# Patient Record
Sex: Female | Born: 1935 | Race: White | Hispanic: No | Smoking: Never smoker
Health system: Southern US, Community
[De-identification: ages and names within clinical notes are randomized; demographics above are authoritative.]

## PROBLEM LIST (undated history)

## (undated) DIAGNOSIS — R251 Tremor, unspecified: Secondary | ICD-10-CM

## (undated) DIAGNOSIS — K219 Gastro-esophageal reflux disease without esophagitis: Secondary | ICD-10-CM

## (undated) DIAGNOSIS — I509 Heart failure, unspecified: Secondary | ICD-10-CM

## (undated) DIAGNOSIS — Z9889 Other specified postprocedural states: Secondary | ICD-10-CM

## (undated) DIAGNOSIS — R0600 Dyspnea, unspecified: Secondary | ICD-10-CM

## (undated) DIAGNOSIS — I499 Cardiac arrhythmia, unspecified: Secondary | ICD-10-CM

## (undated) DIAGNOSIS — IMO0002 Reserved for concepts with insufficient information to code with codable children: Secondary | ICD-10-CM

## (undated) DIAGNOSIS — R112 Nausea with vomiting, unspecified: Secondary | ICD-10-CM

## (undated) DIAGNOSIS — Z923 Personal history of irradiation: Secondary | ICD-10-CM

## (undated) DIAGNOSIS — I2510 Atherosclerotic heart disease of native coronary artery without angina pectoris: Secondary | ICD-10-CM

## (undated) DIAGNOSIS — I495 Sick sinus syndrome: Secondary | ICD-10-CM

## (undated) DIAGNOSIS — J3089 Other allergic rhinitis: Secondary | ICD-10-CM

## (undated) DIAGNOSIS — Z950 Presence of cardiac pacemaker: Secondary | ICD-10-CM

## (undated) DIAGNOSIS — E785 Hyperlipidemia, unspecified: Secondary | ICD-10-CM

## (undated) DIAGNOSIS — K589 Irritable bowel syndrome without diarrhea: Secondary | ICD-10-CM

## (undated) DIAGNOSIS — K635 Polyp of colon: Secondary | ICD-10-CM

## (undated) DIAGNOSIS — S32020A Wedge compression fracture of second lumbar vertebra, initial encounter for closed fracture: Secondary | ICD-10-CM

## (undated) DIAGNOSIS — I4891 Unspecified atrial fibrillation: Secondary | ICD-10-CM

## (undated) DIAGNOSIS — I10 Essential (primary) hypertension: Secondary | ICD-10-CM

## (undated) DIAGNOSIS — E119 Type 2 diabetes mellitus without complications: Secondary | ICD-10-CM

## (undated) HISTORY — DX: Gastro-esophageal reflux disease without esophagitis: K21.9

## (undated) HISTORY — PX: BREAST BIOPSY: SHX20

## (undated) HISTORY — DX: Reserved for concepts with insufficient information to code with codable children: IMO0002

## (undated) HISTORY — DX: Wedge compression fracture of second lumbar vertebra, initial encounter for closed fracture: S32.020A

## (undated) HISTORY — DX: Polyp of colon: K63.5

## (undated) HISTORY — PX: ABDOMINAL ADHESION SURGERY: SHX90

## (undated) HISTORY — DX: Hyperlipidemia, unspecified: E78.5

## (undated) HISTORY — DX: Irritable bowel syndrome, unspecified: K58.9

## (undated) HISTORY — DX: Essential (primary) hypertension: I10

## (undated) HISTORY — PX: ABDOMINAL HYSTERECTOMY: SHX81

## (undated) HISTORY — DX: Type 2 diabetes mellitus without complications: E11.9

## (undated) HISTORY — PX: OTHER SURGICAL HISTORY: SHX169

## (undated) HISTORY — PX: BREAST SURGERY: SHX581

## (undated) HISTORY — PX: BREAST LUMPECTOMY: SHX2

## (undated) HISTORY — DX: Tremor, unspecified: R25.1

## (undated) HISTORY — PX: APPENDECTOMY: SHX54

## (undated) HISTORY — DX: Unspecified atrial fibrillation: I48.91

---

## 1999-03-09 LAB — HM COLONOSCOPY

## 2001-06-15 DIAGNOSIS — K635 Polyp of colon: Secondary | ICD-10-CM

## 2001-06-15 HISTORY — PX: UPPER GI ENDOSCOPY: SHX6162

## 2001-06-15 HISTORY — DX: Polyp of colon: K63.5

## 2001-06-15 HISTORY — PX: COLONOSCOPY: SHX174

## 2010-06-15 DIAGNOSIS — I4891 Unspecified atrial fibrillation: Secondary | ICD-10-CM

## 2010-06-15 HISTORY — DX: Unspecified atrial fibrillation: I48.91

## 2011-03-14 ENCOUNTER — Ambulatory Visit: Payer: Self-pay | Admitting: Internal Medicine

## 2011-03-17 ENCOUNTER — Inpatient Hospital Stay: Payer: Self-pay | Admitting: Internal Medicine

## 2011-05-06 ENCOUNTER — Inpatient Hospital Stay: Payer: Self-pay | Admitting: Internal Medicine

## 2011-06-11 ENCOUNTER — Observation Stay: Payer: Self-pay | Admitting: *Deleted

## 2011-06-26 DIAGNOSIS — I2510 Atherosclerotic heart disease of native coronary artery without angina pectoris: Secondary | ICD-10-CM | POA: Diagnosis not present

## 2011-07-16 DIAGNOSIS — I2510 Atherosclerotic heart disease of native coronary artery without angina pectoris: Secondary | ICD-10-CM | POA: Diagnosis not present

## 2011-07-16 DIAGNOSIS — I119 Hypertensive heart disease without heart failure: Secondary | ICD-10-CM | POA: Diagnosis not present

## 2011-07-16 DIAGNOSIS — I209 Angina pectoris, unspecified: Secondary | ICD-10-CM | POA: Diagnosis not present

## 2011-07-16 DIAGNOSIS — I4891 Unspecified atrial fibrillation: Secondary | ICD-10-CM | POA: Diagnosis not present

## 2011-08-19 DIAGNOSIS — I4891 Unspecified atrial fibrillation: Secondary | ICD-10-CM | POA: Diagnosis not present

## 2011-08-19 DIAGNOSIS — I209 Angina pectoris, unspecified: Secondary | ICD-10-CM | POA: Diagnosis not present

## 2011-08-19 DIAGNOSIS — I2510 Atherosclerotic heart disease of native coronary artery without angina pectoris: Secondary | ICD-10-CM | POA: Diagnosis not present

## 2011-08-19 DIAGNOSIS — I119 Hypertensive heart disease without heart failure: Secondary | ICD-10-CM | POA: Diagnosis not present

## 2011-09-08 DIAGNOSIS — I10 Essential (primary) hypertension: Secondary | ICD-10-CM | POA: Diagnosis not present

## 2011-09-08 DIAGNOSIS — E119 Type 2 diabetes mellitus without complications: Secondary | ICD-10-CM | POA: Diagnosis not present

## 2011-09-08 DIAGNOSIS — G250 Essential tremor: Secondary | ICD-10-CM | POA: Diagnosis not present

## 2011-09-08 DIAGNOSIS — G252 Other specified forms of tremor: Secondary | ICD-10-CM | POA: Diagnosis not present

## 2011-09-08 DIAGNOSIS — Z5181 Encounter for therapeutic drug level monitoring: Secondary | ICD-10-CM | POA: Diagnosis not present

## 2011-09-08 DIAGNOSIS — Z7901 Long term (current) use of anticoagulants: Secondary | ICD-10-CM | POA: Diagnosis not present

## 2011-09-17 DIAGNOSIS — Z79899 Other long term (current) drug therapy: Secondary | ICD-10-CM | POA: Diagnosis not present

## 2011-09-17 DIAGNOSIS — R259 Unspecified abnormal involuntary movements: Secondary | ICD-10-CM | POA: Diagnosis not present

## 2011-09-17 DIAGNOSIS — I10 Essential (primary) hypertension: Secondary | ICD-10-CM | POA: Diagnosis not present

## 2011-09-17 DIAGNOSIS — R0789 Other chest pain: Secondary | ICD-10-CM | POA: Diagnosis not present

## 2011-09-17 DIAGNOSIS — Z7901 Long term (current) use of anticoagulants: Secondary | ICD-10-CM | POA: Diagnosis not present

## 2011-09-17 DIAGNOSIS — R079 Chest pain, unspecified: Secondary | ICD-10-CM | POA: Diagnosis not present

## 2011-09-17 DIAGNOSIS — I4891 Unspecified atrial fibrillation: Secondary | ICD-10-CM | POA: Diagnosis not present

## 2011-09-17 DIAGNOSIS — E119 Type 2 diabetes mellitus without complications: Secondary | ICD-10-CM | POA: Diagnosis not present

## 2011-09-17 DIAGNOSIS — K219 Gastro-esophageal reflux disease without esophagitis: Secondary | ICD-10-CM | POA: Diagnosis not present

## 2011-09-17 DIAGNOSIS — E785 Hyperlipidemia, unspecified: Secondary | ICD-10-CM | POA: Diagnosis not present

## 2011-09-18 DIAGNOSIS — E119 Type 2 diabetes mellitus without complications: Secondary | ICD-10-CM | POA: Diagnosis not present

## 2011-09-18 DIAGNOSIS — R079 Chest pain, unspecified: Secondary | ICD-10-CM | POA: Diagnosis not present

## 2011-09-18 DIAGNOSIS — I10 Essential (primary) hypertension: Secondary | ICD-10-CM | POA: Diagnosis not present

## 2011-10-08 DIAGNOSIS — I2510 Atherosclerotic heart disease of native coronary artery without angina pectoris: Secondary | ICD-10-CM | POA: Diagnosis not present

## 2011-10-08 DIAGNOSIS — E782 Mixed hyperlipidemia: Secondary | ICD-10-CM | POA: Diagnosis not present

## 2011-10-08 DIAGNOSIS — I4891 Unspecified atrial fibrillation: Secondary | ICD-10-CM | POA: Diagnosis not present

## 2011-10-08 DIAGNOSIS — I214 Non-ST elevation (NSTEMI) myocardial infarction: Secondary | ICD-10-CM | POA: Diagnosis not present

## 2011-10-08 DIAGNOSIS — I10 Essential (primary) hypertension: Secondary | ICD-10-CM | POA: Diagnosis not present

## 2011-11-05 DIAGNOSIS — I4891 Unspecified atrial fibrillation: Secondary | ICD-10-CM | POA: Diagnosis not present

## 2011-11-27 DIAGNOSIS — Z1231 Encounter for screening mammogram for malignant neoplasm of breast: Secondary | ICD-10-CM | POA: Diagnosis not present

## 2011-12-01 DIAGNOSIS — I4891 Unspecified atrial fibrillation: Secondary | ICD-10-CM | POA: Diagnosis not present

## 2011-12-08 DIAGNOSIS — I4891 Unspecified atrial fibrillation: Secondary | ICD-10-CM | POA: Diagnosis not present

## 2011-12-08 DIAGNOSIS — E1149 Type 2 diabetes mellitus with other diabetic neurological complication: Secondary | ICD-10-CM | POA: Diagnosis not present

## 2011-12-08 DIAGNOSIS — I10 Essential (primary) hypertension: Secondary | ICD-10-CM | POA: Diagnosis not present

## 2011-12-08 DIAGNOSIS — G250 Essential tremor: Secondary | ICD-10-CM | POA: Diagnosis not present

## 2011-12-08 DIAGNOSIS — G252 Other specified forms of tremor: Secondary | ICD-10-CM | POA: Diagnosis not present

## 2011-12-14 DIAGNOSIS — H353190 Nonexudative age-related macular degeneration, unspecified eye, stage unspecified: Secondary | ICD-10-CM | POA: Diagnosis not present

## 2011-12-14 DIAGNOSIS — H2510 Age-related nuclear cataract, unspecified eye: Secondary | ICD-10-CM | POA: Diagnosis not present

## 2011-12-14 DIAGNOSIS — H04129 Dry eye syndrome of unspecified lacrimal gland: Secondary | ICD-10-CM | POA: Diagnosis not present

## 2011-12-22 DIAGNOSIS — I4891 Unspecified atrial fibrillation: Secondary | ICD-10-CM | POA: Diagnosis not present

## 2011-12-22 DIAGNOSIS — E1149 Type 2 diabetes mellitus with other diabetic neurological complication: Secondary | ICD-10-CM | POA: Diagnosis not present

## 2011-12-30 DIAGNOSIS — I4891 Unspecified atrial fibrillation: Secondary | ICD-10-CM | POA: Diagnosis not present

## 2012-01-12 DIAGNOSIS — I4891 Unspecified atrial fibrillation: Secondary | ICD-10-CM | POA: Diagnosis not present

## 2012-02-01 DIAGNOSIS — I447 Left bundle-branch block, unspecified: Secondary | ICD-10-CM | POA: Diagnosis not present

## 2012-02-01 DIAGNOSIS — I10 Essential (primary) hypertension: Secondary | ICD-10-CM | POA: Diagnosis not present

## 2012-02-01 DIAGNOSIS — R002 Palpitations: Secondary | ICD-10-CM | POA: Diagnosis not present

## 2012-02-01 DIAGNOSIS — R55 Syncope and collapse: Secondary | ICD-10-CM | POA: Diagnosis not present

## 2012-02-01 DIAGNOSIS — I209 Angina pectoris, unspecified: Secondary | ICD-10-CM | POA: Diagnosis not present

## 2012-02-01 DIAGNOSIS — I4891 Unspecified atrial fibrillation: Secondary | ICD-10-CM | POA: Diagnosis not present

## 2012-02-01 DIAGNOSIS — R0609 Other forms of dyspnea: Secondary | ICD-10-CM | POA: Diagnosis not present

## 2012-02-01 DIAGNOSIS — F411 Generalized anxiety disorder: Secondary | ICD-10-CM | POA: Diagnosis not present

## 2012-02-01 DIAGNOSIS — K219 Gastro-esophageal reflux disease without esophagitis: Secondary | ICD-10-CM | POA: Diagnosis not present

## 2012-02-02 DIAGNOSIS — I4891 Unspecified atrial fibrillation: Secondary | ICD-10-CM | POA: Diagnosis not present

## 2012-02-16 ENCOUNTER — Emergency Department: Payer: Self-pay | Admitting: Emergency Medicine

## 2012-02-16 DIAGNOSIS — R079 Chest pain, unspecified: Secondary | ICD-10-CM | POA: Diagnosis not present

## 2012-02-16 DIAGNOSIS — I509 Heart failure, unspecified: Secondary | ICD-10-CM | POA: Diagnosis not present

## 2012-02-16 DIAGNOSIS — R0789 Other chest pain: Secondary | ICD-10-CM | POA: Diagnosis not present

## 2012-02-16 DIAGNOSIS — Z79899 Other long term (current) drug therapy: Secondary | ICD-10-CM | POA: Diagnosis not present

## 2012-02-16 DIAGNOSIS — R52 Pain, unspecified: Secondary | ICD-10-CM | POA: Diagnosis not present

## 2012-02-16 LAB — COMPREHENSIVE METABOLIC PANEL
Albumin: 3.4 g/dL (ref 3.4–5.0)
Alkaline Phosphatase: 60 U/L (ref 50–136)
BUN: 13 mg/dL (ref 7–18)
Bilirubin,Total: 0.5 mg/dL (ref 0.2–1.0)
Calcium, Total: 8.6 mg/dL (ref 8.5–10.1)
Chloride: 96 mmol/L — ABNORMAL LOW (ref 98–107)
Co2: 26 mmol/L (ref 21–32)
Creatinine: 0.72 mg/dL (ref 0.60–1.30)
EGFR (Non-African Amer.): >60
Glucose: 123 mg/dL — ABNORMAL HIGH (ref 65–99)
SGOT(AST): 26 U/L (ref 15–37)
SGPT (ALT): 27 U/L (ref 12–78)
Total Protein: 6.5 g/dL (ref 6.4–8.2)

## 2012-02-16 LAB — CBC
HGB: 13.6 g/dL (ref 12.0–16.0)
MCH: 31.9 pg (ref 26.0–34.0)
MCV: 92 fL (ref 80–100)
Platelet: 208 10*3/uL (ref 150–440)
RBC: 4.25 10*6/uL (ref 3.80–5.20)
RDW: 13.4 % (ref 11.5–14.5)

## 2012-02-16 LAB — PROTIME-INR
INR: 3
Prothrombin Time: 31.3 secs — ABNORMAL HIGH (ref 11.5–14.7)

## 2012-02-16 LAB — CK TOTAL AND CKMB (NOT AT ARMC): CK, Total: 97 U/L (ref 21–215)

## 2012-02-16 LAB — APTT: Activated PTT: 65 secs — ABNORMAL HIGH (ref 23.6–35.9)

## 2012-02-16 LAB — TROPONIN I: Troponin-I: <0.02 ng/mL

## 2012-02-24 DIAGNOSIS — I4891 Unspecified atrial fibrillation: Secondary | ICD-10-CM | POA: Diagnosis not present

## 2012-02-24 DIAGNOSIS — I2510 Atherosclerotic heart disease of native coronary artery without angina pectoris: Secondary | ICD-10-CM | POA: Diagnosis not present

## 2012-02-24 DIAGNOSIS — I209 Angina pectoris, unspecified: Secondary | ICD-10-CM | POA: Diagnosis not present

## 2012-02-24 DIAGNOSIS — I119 Hypertensive heart disease without heart failure: Secondary | ICD-10-CM | POA: Diagnosis not present

## 2012-03-08 LAB — HM DIABETES EYE EXAM

## 2012-03-09 DIAGNOSIS — I2510 Atherosclerotic heart disease of native coronary artery without angina pectoris: Secondary | ICD-10-CM | POA: Diagnosis not present

## 2012-03-09 DIAGNOSIS — I4891 Unspecified atrial fibrillation: Secondary | ICD-10-CM | POA: Diagnosis not present

## 2012-03-09 DIAGNOSIS — I5022 Chronic systolic (congestive) heart failure: Secondary | ICD-10-CM | POA: Diagnosis not present

## 2012-03-09 DIAGNOSIS — I209 Angina pectoris, unspecified: Secondary | ICD-10-CM | POA: Diagnosis not present

## 2012-03-10 DIAGNOSIS — Z23 Encounter for immunization: Secondary | ICD-10-CM | POA: Diagnosis not present

## 2012-03-11 DIAGNOSIS — I4891 Unspecified atrial fibrillation: Secondary | ICD-10-CM | POA: Diagnosis not present

## 2012-03-15 HISTORY — PX: CHOLECYSTECTOMY: SHX55

## 2012-03-17 DIAGNOSIS — I4891 Unspecified atrial fibrillation: Secondary | ICD-10-CM | POA: Diagnosis not present

## 2012-03-22 ENCOUNTER — Emergency Department: Payer: Self-pay | Admitting: Emergency Medicine

## 2012-03-22 DIAGNOSIS — K8020 Calculus of gallbladder without cholecystitis without obstruction: Secondary | ICD-10-CM | POA: Diagnosis not present

## 2012-03-22 DIAGNOSIS — R1011 Right upper quadrant pain: Secondary | ICD-10-CM | POA: Diagnosis not present

## 2012-03-22 DIAGNOSIS — R110 Nausea: Secondary | ICD-10-CM | POA: Diagnosis not present

## 2012-03-22 LAB — COMPREHENSIVE METABOLIC PANEL
Albumin: 3.7 g/dL (ref 3.4–5.0)
Alkaline Phosphatase: 68 U/L (ref 50–136)
Anion Gap: 3 — ABNORMAL LOW (ref 7–16)
Bilirubin,Total: 0.6 mg/dL (ref 0.2–1.0)
Calcium, Total: 9.7 mg/dL (ref 8.5–10.1)
Creatinine: 0.81 mg/dL (ref 0.60–1.30)
Glucose: 159 mg/dL — ABNORMAL HIGH (ref 65–99)
Osmolality: 279 (ref 275–301)
Potassium: 4.8 mmol/L (ref 3.5–5.1)
SGOT(AST): 19 U/L (ref 15–37)
Sodium: 138 mmol/L (ref 136–145)
Total Protein: 7.3 g/dL (ref 6.4–8.2)

## 2012-03-22 LAB — URINALYSIS, COMPLETE
Bacteria: NONE SEEN
Bilirubin,UR: NEGATIVE
Glucose,UR: NEGATIVE mg/dL (ref 0–75)
RBC,UR: 1 /HPF (ref 0–5)
Squamous Epithelial: NONE SEEN
WBC UR: 1 /HPF (ref 0–5)

## 2012-03-22 LAB — CBC
MCH: 31.7 pg (ref 26.0–34.0)
Platelet: 263 10*3/uL (ref 150–440)
RBC: 4.73 10*6/uL (ref 3.80–5.20)
RDW: 13.3 % (ref 11.5–14.5)
WBC: 5.3 10*3/uL (ref 3.6–11.0)

## 2012-03-22 LAB — LIPASE, BLOOD: Lipase: 172 U/L (ref 73–393)

## 2012-03-23 ENCOUNTER — Inpatient Hospital Stay: Payer: Self-pay | Admitting: Surgery

## 2012-03-23 DIAGNOSIS — I428 Other cardiomyopathies: Secondary | ICD-10-CM | POA: Diagnosis present

## 2012-03-23 DIAGNOSIS — R110 Nausea: Secondary | ICD-10-CM | POA: Diagnosis not present

## 2012-03-23 DIAGNOSIS — I2510 Atherosclerotic heart disease of native coronary artery without angina pectoris: Secondary | ICD-10-CM | POA: Diagnosis present

## 2012-03-23 DIAGNOSIS — I10 Essential (primary) hypertension: Secondary | ICD-10-CM | POA: Diagnosis present

## 2012-03-23 DIAGNOSIS — Z79899 Other long term (current) drug therapy: Secondary | ICD-10-CM | POA: Diagnosis not present

## 2012-03-23 DIAGNOSIS — R1011 Right upper quadrant pain: Secondary | ICD-10-CM | POA: Diagnosis not present

## 2012-03-23 DIAGNOSIS — K8020 Calculus of gallbladder without cholecystitis without obstruction: Secondary | ICD-10-CM | POA: Diagnosis not present

## 2012-03-23 DIAGNOSIS — K8010 Calculus of gallbladder with chronic cholecystitis without obstruction: Secondary | ICD-10-CM | POA: Diagnosis not present

## 2012-03-23 DIAGNOSIS — E785 Hyperlipidemia, unspecified: Secondary | ICD-10-CM | POA: Diagnosis present

## 2012-03-23 DIAGNOSIS — K8000 Calculus of gallbladder with acute cholecystitis without obstruction: Secondary | ICD-10-CM | POA: Diagnosis not present

## 2012-03-23 DIAGNOSIS — Z880 Allergy status to penicillin: Secondary | ICD-10-CM | POA: Diagnosis not present

## 2012-03-23 DIAGNOSIS — Z885 Allergy status to narcotic agent status: Secondary | ICD-10-CM | POA: Diagnosis not present

## 2012-03-23 DIAGNOSIS — I38 Endocarditis, valve unspecified: Secondary | ICD-10-CM | POA: Diagnosis not present

## 2012-03-23 DIAGNOSIS — Z7901 Long term (current) use of anticoagulants: Secondary | ICD-10-CM | POA: Diagnosis not present

## 2012-03-23 DIAGNOSIS — I4891 Unspecified atrial fibrillation: Secondary | ICD-10-CM | POA: Diagnosis present

## 2012-03-23 DIAGNOSIS — I209 Angina pectoris, unspecified: Secondary | ICD-10-CM | POA: Diagnosis present

## 2012-03-23 DIAGNOSIS — K819 Cholecystitis, unspecified: Secondary | ICD-10-CM | POA: Diagnosis not present

## 2012-03-23 DIAGNOSIS — Z888 Allergy status to other drugs, medicaments and biological substances status: Secondary | ICD-10-CM | POA: Diagnosis not present

## 2012-03-23 DIAGNOSIS — Z7982 Long term (current) use of aspirin: Secondary | ICD-10-CM | POA: Diagnosis not present

## 2012-03-23 DIAGNOSIS — E119 Type 2 diabetes mellitus without complications: Secondary | ICD-10-CM | POA: Diagnosis not present

## 2012-03-24 LAB — CBC WITH DIFFERENTIAL/PLATELET
Eosinophil #: 0.1 10*3/uL (ref 0.0–0.7)
Eosinophil %: 2.9 %
Lymphocyte #: 1.4 10*3/uL (ref 1.0–3.6)
MCH: 33.2 pg (ref 26.0–34.0)
MCHC: 35.9 g/dL (ref 32.0–36.0)
Monocyte #: 0.5 x10 3/mm (ref 0.2–0.9)
Neutrophil #: 2.3 10*3/uL (ref 1.4–6.5)
Neutrophil %: 53.4 %
Platelet: 228 10*3/uL (ref 150–440)
RDW: 13.1 % (ref 11.5–14.5)

## 2012-03-24 LAB — COMPREHENSIVE METABOLIC PANEL
Anion Gap: 8 (ref 7–16)
Calcium, Total: 8.7 mg/dL (ref 8.5–10.1)
Chloride: 105 mmol/L (ref 98–107)
Creatinine: 0.69 mg/dL (ref 0.60–1.30)
EGFR (African American): >60
EGFR (Non-African Amer.): >60
Glucose: 151 mg/dL — ABNORMAL HIGH (ref 65–99)
Osmolality: 282 (ref 275–301)
Potassium: 4 mmol/L (ref 3.5–5.1)
SGOT(AST): 55 U/L — ABNORMAL HIGH (ref 15–37)
SGPT (ALT): 23 U/L (ref 12–78)
Sodium: 141 mmol/L (ref 136–145)
Total Protein: 6.1 g/dL — ABNORMAL LOW (ref 6.4–8.2)

## 2012-03-24 LAB — PROTIME-INR
INR: 2.8
Prothrombin Time: 29.4 secs — ABNORMAL HIGH (ref 11.5–14.7)

## 2012-03-25 LAB — CBC WITH DIFFERENTIAL/PLATELET
Eosinophil %: 4.1 %
Lymphocyte #: 1.5 10*3/uL (ref 1.0–3.6)
Lymphocyte %: 38.3 %
MCH: 31.5 pg (ref 26.0–34.0)
Monocyte %: 11 %
Neutrophil %: 46.1 %
Platelet: 223 10*3/uL (ref 150–440)
RBC: 4.24 10*6/uL (ref 3.80–5.20)
RDW: 13.1 % (ref 11.5–14.5)
WBC: 3.9 10*3/uL (ref 3.6–11.0)

## 2012-03-25 LAB — COMPREHENSIVE METABOLIC PANEL
Albumin: 3.5 g/dL (ref 3.4–5.0)
Alkaline Phosphatase: 59 U/L (ref 50–136)
BUN: 9 mg/dL (ref 7–18)
Bilirubin,Total: 0.7 mg/dL (ref 0.2–1.0)
Creatinine: 0.73 mg/dL (ref 0.60–1.30)
EGFR (African American): >60
Glucose: 75 mg/dL (ref 65–99)
Osmolality: 277 (ref 275–301)
SGPT (ALT): 28 U/L (ref 12–78)
Sodium: 140 mmol/L (ref 136–145)
Total Protein: 6.5 g/dL (ref 6.4–8.2)

## 2012-03-25 LAB — PROTIME-INR: INR: 2.2

## 2012-03-26 LAB — COMPREHENSIVE METABOLIC PANEL
Albumin: 3.5 g/dL (ref 3.4–5.0)
Anion Gap: 9 (ref 7–16)
BUN: 11 mg/dL (ref 7–18)
Bilirubin,Total: 0.8 mg/dL (ref 0.2–1.0)
Calcium, Total: 9.4 mg/dL (ref 8.5–10.1)
Co2: 29 mmol/L (ref 21–32)
Creatinine: 0.76 mg/dL (ref 0.60–1.30)
EGFR (Non-African Amer.): >60
Glucose: 98 mg/dL (ref 65–99)
Osmolality: 277 (ref 275–301)
Potassium: 3.4 mmol/L — ABNORMAL LOW (ref 3.5–5.1)
SGOT(AST): 20 U/L (ref 15–37)
Sodium: 139 mmol/L (ref 136–145)

## 2012-03-26 LAB — CBC WITH DIFFERENTIAL/PLATELET
Basophil #: 0 10*3/uL (ref 0.0–0.1)
Basophil %: 0.3 %
Eosinophil #: 0.1 10*3/uL (ref 0.0–0.7)
HCT: 39.3 % (ref 35.0–47.0)
HGB: 13.6 g/dL (ref 12.0–16.0)
Lymphocyte #: 1.5 10*3/uL (ref 1.0–3.6)
MCH: 31.8 pg (ref 26.0–34.0)
MCHC: 34.6 g/dL (ref 32.0–36.0)
MCV: 92 fL (ref 80–100)
Monocyte #: 0.5 x10 3/mm (ref 0.2–0.9)
Monocyte %: 8.4 %
Neutrophil #: 3.5 10*3/uL (ref 1.4–6.5)
Platelet: 229 10*3/uL (ref 150–440)
RDW: 12.9 % (ref 11.5–14.5)

## 2012-03-27 LAB — CBC WITH DIFFERENTIAL/PLATELET
Basophil #: 0 10*3/uL (ref 0.0–0.1)
Basophil %: 0 %
Eosinophil #: 0 10*3/uL (ref 0.0–0.7)
HCT: 40.1 % (ref 35.0–47.0)
HGB: 13.5 g/dL (ref 12.0–16.0)
Lymphocyte #: 0.6 10*3/uL — ABNORMAL LOW (ref 1.0–3.6)
MCH: 31.1 pg (ref 26.0–34.0)
MCHC: 33.7 g/dL (ref 32.0–36.0)
MCV: 92 fL (ref 80–100)
Monocyte #: 0.4 x10 3/mm (ref 0.2–0.9)
Neutrophil #: 10.1 10*3/uL — ABNORMAL HIGH (ref 1.4–6.5)
RDW: 13.3 % (ref 11.5–14.5)

## 2012-03-27 LAB — COMPREHENSIVE METABOLIC PANEL
Albumin: 3.5 g/dL (ref 3.4–5.0)
Anion Gap: 9 (ref 7–16)
BUN: 15 mg/dL (ref 7–18)
Calcium, Total: 9.3 mg/dL (ref 8.5–10.1)
Chloride: 98 mmol/L (ref 98–107)
Co2: 29 mmol/L (ref 21–32)
Creatinine: 1.07 mg/dL (ref 0.60–1.30)
EGFR (African American): 56 — ABNORMAL LOW
EGFR (Non-African Amer.): 48 — ABNORMAL LOW
Glucose: 154 mg/dL — ABNORMAL HIGH (ref 65–99)
Osmolality: 276 (ref 275–301)
Potassium: 3.4 mmol/L — ABNORMAL LOW (ref 3.5–5.1)
SGOT(AST): 45 U/L — ABNORMAL HIGH (ref 15–37)
Sodium: 136 mmol/L (ref 136–145)
Total Protein: 6.6 g/dL (ref 6.4–8.2)

## 2012-03-27 LAB — PROTIME-INR: Prothrombin Time: 17 secs — ABNORMAL HIGH (ref 11.5–14.7)

## 2012-03-28 LAB — PROTIME-INR
INR: 2
Prothrombin Time: 23 secs — ABNORMAL HIGH (ref 11.5–14.7)

## 2012-03-28 LAB — COMPREHENSIVE METABOLIC PANEL
Albumin: 3.1 g/dL — ABNORMAL LOW (ref 3.4–5.0)
Alkaline Phosphatase: 52 U/L (ref 50–136)
Anion Gap: 8 (ref 7–16)
BUN: 14 mg/dL (ref 7–18)
Bilirubin,Total: 0.6 mg/dL (ref 0.2–1.0)
Calcium, Total: 8.8 mg/dL (ref 8.5–10.1)
Creatinine: 0.9 mg/dL (ref 0.60–1.30)
EGFR (African American): >60
Glucose: 107 mg/dL — ABNORMAL HIGH (ref 65–99)
Osmolality: 278 (ref 275–301)
SGOT(AST): 53 U/L — ABNORMAL HIGH (ref 15–37)
SGPT (ALT): 45 U/L (ref 12–78)
Sodium: 139 mmol/L (ref 136–145)
Total Protein: 5.8 g/dL — ABNORMAL LOW (ref 6.4–8.2)

## 2012-03-28 LAB — CBC WITH DIFFERENTIAL/PLATELET
Basophil %: 0.2 %
Eosinophil #: 0 10*3/uL (ref 0.0–0.7)
Eosinophil %: 0.3 %
HCT: 36.1 % (ref 35.0–47.0)
HGB: 12.5 g/dL (ref 12.0–16.0)
Lymphocyte %: 18.3 %
MCH: 32 pg (ref 26.0–34.0)
MCHC: 34.6 g/dL (ref 32.0–36.0)
Monocyte #: 0.7 x10 3/mm (ref 0.2–0.9)
Monocyte %: 7.7 %
Neutrophil %: 73.5 %
RDW: 13 % (ref 11.5–14.5)
WBC: 9 10*3/uL (ref 3.6–11.0)

## 2012-04-13 DIAGNOSIS — I4891 Unspecified atrial fibrillation: Secondary | ICD-10-CM | POA: Diagnosis not present

## 2012-04-13 DIAGNOSIS — I209 Angina pectoris, unspecified: Secondary | ICD-10-CM | POA: Diagnosis not present

## 2012-04-13 DIAGNOSIS — I10 Essential (primary) hypertension: Secondary | ICD-10-CM | POA: Diagnosis not present

## 2012-04-13 DIAGNOSIS — E782 Mixed hyperlipidemia: Secondary | ICD-10-CM | POA: Diagnosis not present

## 2012-05-16 DIAGNOSIS — I2510 Atherosclerotic heart disease of native coronary artery without angina pectoris: Secondary | ICD-10-CM | POA: Diagnosis not present

## 2012-05-16 DIAGNOSIS — I10 Essential (primary) hypertension: Secondary | ICD-10-CM | POA: Diagnosis not present

## 2012-05-16 DIAGNOSIS — I4891 Unspecified atrial fibrillation: Secondary | ICD-10-CM | POA: Diagnosis not present

## 2012-05-16 DIAGNOSIS — I359 Nonrheumatic aortic valve disorder, unspecified: Secondary | ICD-10-CM | POA: Diagnosis not present

## 2012-06-06 DIAGNOSIS — I4891 Unspecified atrial fibrillation: Secondary | ICD-10-CM | POA: Diagnosis not present

## 2012-07-04 DIAGNOSIS — I4891 Unspecified atrial fibrillation: Secondary | ICD-10-CM | POA: Diagnosis not present

## 2012-08-02 DIAGNOSIS — I4891 Unspecified atrial fibrillation: Secondary | ICD-10-CM | POA: Diagnosis not present

## 2012-08-30 DIAGNOSIS — Z5181 Encounter for therapeutic drug level monitoring: Secondary | ICD-10-CM | POA: Diagnosis not present

## 2012-09-20 DIAGNOSIS — E119 Type 2 diabetes mellitus without complications: Secondary | ICD-10-CM | POA: Diagnosis not present

## 2012-09-20 DIAGNOSIS — I10 Essential (primary) hypertension: Secondary | ICD-10-CM | POA: Diagnosis not present

## 2012-09-20 DIAGNOSIS — E1149 Type 2 diabetes mellitus with other diabetic neurological complication: Secondary | ICD-10-CM | POA: Diagnosis not present

## 2012-09-20 DIAGNOSIS — I4891 Unspecified atrial fibrillation: Secondary | ICD-10-CM | POA: Diagnosis not present

## 2012-09-20 DIAGNOSIS — E1142 Type 2 diabetes mellitus with diabetic polyneuropathy: Secondary | ICD-10-CM | POA: Diagnosis not present

## 2012-09-29 DIAGNOSIS — Z5181 Encounter for therapeutic drug level monitoring: Secondary | ICD-10-CM | POA: Diagnosis not present

## 2012-09-29 DIAGNOSIS — I4891 Unspecified atrial fibrillation: Secondary | ICD-10-CM | POA: Diagnosis not present

## 2012-09-29 DIAGNOSIS — Z7901 Long term (current) use of anticoagulants: Secondary | ICD-10-CM | POA: Diagnosis not present

## 2012-10-05 DIAGNOSIS — Z5181 Encounter for therapeutic drug level monitoring: Secondary | ICD-10-CM | POA: Diagnosis not present

## 2012-10-05 DIAGNOSIS — I4891 Unspecified atrial fibrillation: Secondary | ICD-10-CM | POA: Diagnosis not present

## 2012-10-05 DIAGNOSIS — Z7901 Long term (current) use of anticoagulants: Secondary | ICD-10-CM | POA: Diagnosis not present

## 2012-11-03 DIAGNOSIS — I4891 Unspecified atrial fibrillation: Secondary | ICD-10-CM | POA: Diagnosis not present

## 2012-11-29 DIAGNOSIS — E1149 Type 2 diabetes mellitus with other diabetic neurological complication: Secondary | ICD-10-CM | POA: Diagnosis not present

## 2012-11-29 DIAGNOSIS — M79609 Pain in unspecified limb: Secondary | ICD-10-CM | POA: Diagnosis not present

## 2012-11-29 DIAGNOSIS — E1142 Type 2 diabetes mellitus with diabetic polyneuropathy: Secondary | ICD-10-CM | POA: Diagnosis not present

## 2012-12-01 DIAGNOSIS — Z5181 Encounter for therapeutic drug level monitoring: Secondary | ICD-10-CM | POA: Diagnosis not present

## 2012-12-01 DIAGNOSIS — Z7901 Long term (current) use of anticoagulants: Secondary | ICD-10-CM | POA: Diagnosis not present

## 2012-12-06 DIAGNOSIS — Z1231 Encounter for screening mammogram for malignant neoplasm of breast: Secondary | ICD-10-CM | POA: Diagnosis not present

## 2012-12-20 DIAGNOSIS — Z7901 Long term (current) use of anticoagulants: Secondary | ICD-10-CM | POA: Diagnosis not present

## 2012-12-20 DIAGNOSIS — I10 Essential (primary) hypertension: Secondary | ICD-10-CM | POA: Diagnosis not present

## 2012-12-20 DIAGNOSIS — I4891 Unspecified atrial fibrillation: Secondary | ICD-10-CM | POA: Diagnosis not present

## 2012-12-20 DIAGNOSIS — E119 Type 2 diabetes mellitus without complications: Secondary | ICD-10-CM | POA: Diagnosis not present

## 2012-12-20 DIAGNOSIS — Z5181 Encounter for therapeutic drug level monitoring: Secondary | ICD-10-CM | POA: Diagnosis not present

## 2012-12-20 DIAGNOSIS — G2581 Restless legs syndrome: Secondary | ICD-10-CM | POA: Diagnosis not present

## 2013-01-05 LAB — HM MAMMOGRAPHY: HM Mammogram: NORMAL

## 2013-01-19 DIAGNOSIS — I4891 Unspecified atrial fibrillation: Secondary | ICD-10-CM | POA: Diagnosis not present

## 2013-01-26 DIAGNOSIS — I4891 Unspecified atrial fibrillation: Secondary | ICD-10-CM | POA: Diagnosis not present

## 2013-02-02 ENCOUNTER — Emergency Department: Payer: Self-pay

## 2013-02-02 DIAGNOSIS — R0602 Shortness of breath: Secondary | ICD-10-CM | POA: Diagnosis not present

## 2013-02-02 DIAGNOSIS — R079 Chest pain, unspecified: Secondary | ICD-10-CM | POA: Diagnosis not present

## 2013-02-02 DIAGNOSIS — R0789 Other chest pain: Secondary | ICD-10-CM | POA: Diagnosis not present

## 2013-02-02 DIAGNOSIS — R52 Pain, unspecified: Secondary | ICD-10-CM | POA: Diagnosis not present

## 2013-02-02 DIAGNOSIS — I209 Angina pectoris, unspecified: Secondary | ICD-10-CM | POA: Diagnosis not present

## 2013-02-02 LAB — CBC
HGB: 13.8 g/dL (ref 12.0–16.0)
MCHC: 34.6 g/dL (ref 32.0–36.0)
MCV: 92 fL (ref 80–100)
RBC: 4.35 10*6/uL (ref 3.80–5.20)
RDW: 13.6 % (ref 11.5–14.5)
WBC: 5.8 10*3/uL (ref 3.6–11.0)

## 2013-02-02 LAB — COMPREHENSIVE METABOLIC PANEL
Albumin: 3.5 g/dL (ref 3.4–5.0)
Anion Gap: 5 — ABNORMAL LOW (ref 7–16)
Chloride: 99 mmol/L (ref 98–107)
EGFR (African American): >60
EGFR (Non-African Amer.): 54 — ABNORMAL LOW
Glucose: 133 mg/dL — ABNORMAL HIGH (ref 65–99)
Osmolality: 271 (ref 275–301)
Potassium: 3.8 mmol/L (ref 3.5–5.1)
SGOT(AST): 25 U/L (ref 15–37)
SGPT (ALT): 28 U/L (ref 12–78)
Sodium: 134 mmol/L — ABNORMAL LOW (ref 136–145)
Total Protein: 6.6 g/dL (ref 6.4–8.2)

## 2013-02-02 LAB — TROPONIN I
Troponin-I: <0.02 ng/mL
Troponin-I: <0.02 ng/mL

## 2013-02-06 DIAGNOSIS — I2510 Atherosclerotic heart disease of native coronary artery without angina pectoris: Secondary | ICD-10-CM | POA: Diagnosis not present

## 2013-02-06 DIAGNOSIS — I4891 Unspecified atrial fibrillation: Secondary | ICD-10-CM | POA: Diagnosis not present

## 2013-02-06 DIAGNOSIS — I119 Hypertensive heart disease without heart failure: Secondary | ICD-10-CM | POA: Diagnosis not present

## 2013-02-06 DIAGNOSIS — I209 Angina pectoris, unspecified: Secondary | ICD-10-CM | POA: Diagnosis not present

## 2013-02-28 DIAGNOSIS — I4891 Unspecified atrial fibrillation: Secondary | ICD-10-CM | POA: Diagnosis not present

## 2013-03-08 ENCOUNTER — Ambulatory Visit (INDEPENDENT_AMBULATORY_CARE_PROVIDER_SITE_OTHER): Payer: Medicare Other | Admitting: Internal Medicine

## 2013-03-08 ENCOUNTER — Encounter: Payer: Self-pay | Admitting: Internal Medicine

## 2013-03-08 VITALS — BP 118/68 | HR 62 | Temp 97.4°F | Resp 14 | Ht 64.75 in | Wt 147.5 lb

## 2013-03-08 DIAGNOSIS — E559 Vitamin D deficiency, unspecified: Secondary | ICD-10-CM

## 2013-03-08 DIAGNOSIS — R252 Cramp and spasm: Secondary | ICD-10-CM | POA: Diagnosis not present

## 2013-03-08 DIAGNOSIS — I2510 Atherosclerotic heart disease of native coronary artery without angina pectoris: Secondary | ICD-10-CM

## 2013-03-08 DIAGNOSIS — E119 Type 2 diabetes mellitus without complications: Secondary | ICD-10-CM

## 2013-03-08 DIAGNOSIS — I10 Essential (primary) hypertension: Secondary | ICD-10-CM

## 2013-03-08 DIAGNOSIS — E785 Hyperlipidemia, unspecified: Secondary | ICD-10-CM

## 2013-03-08 DIAGNOSIS — R5381 Other malaise: Secondary | ICD-10-CM | POA: Diagnosis not present

## 2013-03-08 DIAGNOSIS — K3189 Other diseases of stomach and duodenum: Secondary | ICD-10-CM

## 2013-03-08 DIAGNOSIS — K30 Functional dyspepsia: Secondary | ICD-10-CM

## 2013-03-08 DIAGNOSIS — Z23 Encounter for immunization: Secondary | ICD-10-CM | POA: Diagnosis not present

## 2013-03-08 LAB — CBC WITH DIFFERENTIAL/PLATELET
Basophils Absolute: 0 10*3/uL (ref 0.0–0.1)
Eosinophils Absolute: 0.1 10*3/uL (ref 0.0–0.7)
Hemoglobin: 14.9 g/dL (ref 12.0–15.0)
Lymphocytes Relative: 16.4 % (ref 12.0–46.0)
MCHC: 33.8 g/dL (ref 30.0–36.0)
Monocytes Relative: 8 % (ref 3.0–12.0)
Neutro Abs: 5.3 10*3/uL (ref 1.4–7.7)
Neutrophils Relative %: 74.2 % (ref 43.0–77.0)
RDW: 13.3 % (ref 11.5–14.6)

## 2013-03-08 LAB — CK: Total CK: 88 U/L (ref 7–177)

## 2013-03-08 LAB — TSH: TSH: 0.66 u[IU]/mL (ref 0.35–5.50)

## 2013-03-08 LAB — LDL CHOLESTEROL, DIRECT: Direct LDL: 141.7 mg/dL

## 2013-03-08 LAB — COMPREHENSIVE METABOLIC PANEL
ALT: 23 U/L (ref 0–35)
AST: 27 U/L (ref 0–37)
Albumin: 4.2 g/dL (ref 3.5–5.2)
Alkaline Phosphatase: 58 U/L (ref 39–117)
BUN: 15 mg/dL (ref 6–23)
Calcium: 10.2 mg/dL (ref 8.4–10.5)
Chloride: 100 mEq/L (ref 96–112)
Potassium: 4.5 mEq/L (ref 3.5–5.1)
Sodium: 139 mEq/L (ref 135–145)
Total Bilirubin: 0.8 mg/dL (ref 0.3–1.2)

## 2013-03-08 LAB — HEMOGLOBIN A1C: Hgb A1c MFr Bld: 6.3 % (ref 4.6–6.5)

## 2013-03-08 NOTE — Patient Instructions (Addendum)
Stay off of the simvastatin for another week and keep walking daily to strengthen your leg muscles   Start taking the Co Q 10 daily  Start the Crestor every other day in one week.  If you tolerate it,  Stay on it and we will send an rx to yoru pharmacy  Stop the omeprazole and start the nexium samples  If your stomach feels better on the Nexium,  Let me know.  If not,  I will advise you to see a stomach doctor to have an EGD and a colonoscopy   Return in 3 months,  Sooner if needed  I will send [place] your lab results via MyChart or by phone/mail

## 2013-03-08 NOTE — Progress Notes (Signed)
Patient ID: Leah Holmes, female   DOB: [birth year], 79 y.o.   MRN: [id number] Patient Active Problem List   Diagnosis Date Noted  . CAD (coronary artery disease) 03/09/2013  . Type II or unspecified type diabetes mellitus without mention of complication, not stated as uncontrolled 03/09/2013  . Other and unspecified hyperlipidemia 03/09/2013  . Essential hypertension, benign 03/09/2013  . Nonulcer dyspepsia 03/09/2013    Subjective:  CC:   Chief Complaint  Patient presents with  . Establish Care    HPI:   Leah Holmes is a 79 y.o. female who presents as a new patient to establish primary care with multiple chief complaints : Cc;  1) intermittent stomach problems,  Hiatal hernia .  Wakes up 3 am with feeling sore across the top of abdomen ,  Not relieved with stooling.  Better when she took nexium but now on omeprazole otc. Prior EGD showed gastritis 4 years ago.  Told she had irritable bowel,  Which presents with cramping  And loose stools once daily,  Last occurrence was 2 days ago , usually her symptoms last one or two days .  She notes that she often as excessive flatulence with it.  She has had no prior treatment for IBS. She  is currently living with her daughter caffeine but plans to return to her home in [place] when He feels it is appropriate Lynden Ang does not agree). She has been living with caffeine for the past two years after  hospitalization for hypertensive urgency and new onset atrial fib.  Returns home occasionally to [place].   she is taking coumadin for a fib..  her INR is managed by Dr. Aldine Contes has had no trouble with regulating her Coumadin level. She has had no bleeding episodes does note some bruising occasionally.   she reports no falls in the past year.   2) history of cholecystitis: Her gallbladder was  removed October 2013.  Still has occasional intermittent RUQ pain in same place but not as bad as when she was hospitalized and operated on. Her gallbladder was  removed by Dr. Excell Seltzer at [facility]. Apparently there was a postoperative complication due to persistent bleeding and rather than take her back to the OR and put in several additional stitches at the bedside without focal anesthesia .  the experience was quite painful and the family is quite displeased with his treatment of their mother.  3 ) Chest pain:  She has had Several ER visits for chest pain. Her last one was August 2014 for chest pain  not relieved by ntg.   since she had had a cardiac catheterization in 2011  which showed diffuse disease and medical management was the advice which was supported by [facility] cardio thoracic surgery at time of catheterization, she was not admitted.   4) CAD:  Diffuse disease noted on prior cardiac catheterization. Medical management was advised. The cardiology advice was supported by cardiothoracic surgery consult requested by family. This was done at [facility]. She has a history of statin intolerance due to leg pain. She takes low-dose simvastatin but has never been tried on Crestor or cold Q10. Her pain is described as recurrent bilateral thigh ache and bilateral calf cramps.  5) DM:  her last known A1c was 5.3 several months ago. She has brought a log of her blood sugars for the past month. She has been checking it twice daily both fasting and 2 hour post dinner. Fasting blood sugars have ranged from 80-129 with no  hypoglycemic events. Postprandials have ranged from 128 170 with rare episodes of blood sugar greater than 200 .  She sees her ophthalmologist once yearly. She had a cataract removed from her left eye in 2010 and has one on the right which is not significant enough for surgery.     Past Medical History  Diagnosis Date  . Diabetes mellitus without complication   . Ulcer   . Hyperlipidemia   . Hypertension   . Polyp of colon     Past Surgical History  Procedure Laterality Date  . Cholecystectomy    . Appendectomy    . Breast surgery    . Abdominal  hysterectomy      History reviewed. No pertinent family history.  History   Social History  . Marital Status: Widowed    Spouse Name: N/A    Number of Children: N/A  . Years of Education: N/A   Occupational History  . Not on file.   Social History Main Topics  . Smoking status: Never Smoker   . Smokeless tobacco: Current User    Types: Snuff     Comment: occasionally  . Alcohol Use: No  . Drug Use: No  . Sexual Activity: No   Other Topics Concern  . Not on file   Social History Narrative  . No narrative on file   Allergies  Allergen Reactions  . Penicillins Anaphylaxis  . Clonidine Derivatives   . Gabapentin Nausea Only  . Reglan [Metoclopramide] Other (See Comments)    Tremors and patient continues to have tremors  . Hydralazine Anxiety      Review of Systems:   Patient denies headache, fevers, malaise, unintentional weight loss, skin rash, eye pain, sinus congestion and sinus pain, sore throat, dysphagia,  hemoptysis , cough, dyspnea, wheezing, chest pain, palpitations, orthopnea, edema, abdominal pain, nausea, melena, diarrhea, constipation, flank pain, dysuria, hematuria, urinary  Frequency, nocturia, numbness, tingling, seizures,  Focal weakness, Loss of consciousness,  Tremor, insomnia, depression, anxiety, and suicidal ideation.        Objective:  BP 118/68  Pulse 62  Temp(Src) 97.4 F (36.3 C) (Oral)  Resp 14  Ht 5' 4.75" (1.645 m)  Wt 147 lb 8 oz (66.906 kg)  BMI 24.72 kg/m2  SpO2 98%  General appearance: alert, cooperative and appears stated age Ears: normal TM's and external ear canals both ears Throat: lips, mucosa, and tongue normal; teeth and gums normal Neck: no adenopathy, no carotid bruit, supple, symmetrical, trachea midline and thyroid not enlarged, symmetric, no tenderness/mass/nodules Back: symmetric, no curvature. ROM normal. No CVA tenderness. Lungs: clear to auscultation bilaterally Heart: regular rate and rhythm, S1, S2  normal, no murmur, click, rub or gallop Abdomen: soft, non-tender; bowel sounds normal; no masses,  no organomegaly Pulses: 2+ and symmetric Skin: Skin color, texture, turgor normal. No rashes or lesions Lymph nodes: Cervical, supraclavicular, and axillary nodes normal. Foot exam:  Nails are well trimmed,  No callouses,  Sensation intact to microfilament   Assessment and Plan:  CAD (coronary artery disease) I discussed a trial of Crestor for management of hyperlipidemia given her known coronary artery disease. I suggested that she start cookie 10 a week prior. Samples of Crestor have been given to patient 10 mg. She has had several ER visits for chest pain but has not been admitted due to and normal troponins. No other medication changes have been made today.  Type II or unspecified type diabetes mellitus without mention of complication, not stated as uncontrolled  Well-controlled on current medications.  hemoglobin A1c has been historically less than 7.0 . sHe is up-to-date on eye exams and his foot exam is norma. l we'll repeat his urine microalbumin to creatinine ratio at next visit. sHe is on the appropriate medications.  Other and unspecified hyperlipidemia Her LDL is currently 141 and liver enzymes, muscle enzyme and thyroid function are normal. She should be on a high potency  statin. She has a history of leg pain with simvastatin. We will try Crestor 10 mg every other day starting next week along with cookie 10. Samples given of Crestor.  Essential hypertension, benign Well controlled on current regimen. Renal function stable, no changes today.  Nonulcer dyspepsia We are changing her therapy from over-the-counter Prilosec to Nexium 40 mg daily. I dyspepsia improves, no further workup. if it does not, I will recommend repeat evaluation by GI.   A total of 60 minutes was spent with patient more than half of which was spent in counseling, reviewing records from other prviders and  coordination of care.   Updated Medication List Outpatient Encounter Prescriptions as of 03/08/2013  Medication Sig Dispense Refill  . amLODipine (NORVASC) 10 MG tablet Take 10 mg by mouth daily.      Marland Kitchen aspirin 81 MG tablet Take 81 mg by mouth daily.      Marland Kitchen atenolol (TENORMIN) 25 MG tablet Take 25 mg by mouth daily.      Marland Kitchen esomeprazole (NEXIUM) 40 MG packet Take 40 mg by mouth daily before breakfast.  15 each  12  . glipiZIDE (GLUCOTROL) 5 MG tablet Take 5 mg by mouth 2 (two) times daily before a meal.      . hydrochlorothiazide (HYDRODIURIL) 25 MG tablet Take 25 mg by mouth daily.      . isosorbide mononitrate (IMDUR) 60 MG 24 hr tablet Take 60 mg by mouth daily.      Marland Kitchen lisinopril (PRINIVIL,ZESTRIL) 20 MG tablet Take 20 mg by mouth daily.      . meclizine (ANTIVERT) 25 MG tablet Take 25 mg by mouth 3 (three) times daily as needed.      . nitroGLYCERIN (NITROSTAT) 0.4 MG SL tablet Place 0.4 mg under the tongue every 5 (five) minutes as needed for chest pain.      . rosuvastatin (CRESTOR) 10 MG tablet Take 1 tablet (10 mg total) by mouth every other day.  90 tablet  3  . warfarin (COUMADIN) 2 MG tablet Take 2 mg by mouth daily.      . [DISCONTINUED] omeprazole (PRILOSEC) 20 MG capsule Take 20 mg by mouth daily.      . [DISCONTINUED] simvastatin (ZOCOR) 10 MG tablet Take 10 mg by mouth at bedtime.       No facility-administered encounter medications on file as of 03/08/2013.     Orders Placed This Encounter  Procedures  . HM MAMMOGRAPHY  . Flu Vaccine QUAD 36+ mos PF IM (Fluarix)  . Hemoglobin A1c  . Microalbumin / creatinine urine ratio  . CBC with Differential  . Comprehensive metabolic panel  . TSH  . Vit D  25 hydroxy (rtn osteoporosis monitoring)  . CK  . LDL cholesterol, direct  . HM DIABETES EYE EXAM  . HM COLONOSCOPY    No Follow-up on file.

## 2013-03-09 ENCOUNTER — Encounter: Payer: Self-pay | Admitting: Internal Medicine

## 2013-03-09 DIAGNOSIS — I10 Essential (primary) hypertension: Secondary | ICD-10-CM | POA: Insufficient documentation

## 2013-03-09 DIAGNOSIS — E119 Type 2 diabetes mellitus without complications: Secondary | ICD-10-CM | POA: Insufficient documentation

## 2013-03-09 DIAGNOSIS — E1169 Type 2 diabetes mellitus with other specified complication: Secondary | ICD-10-CM | POA: Insufficient documentation

## 2013-03-09 DIAGNOSIS — E1142 Type 2 diabetes mellitus with diabetic polyneuropathy: Secondary | ICD-10-CM | POA: Insufficient documentation

## 2013-03-09 DIAGNOSIS — I2510 Atherosclerotic heart disease of native coronary artery without angina pectoris: Secondary | ICD-10-CM | POA: Insufficient documentation

## 2013-03-09 DIAGNOSIS — K30 Functional dyspepsia: Secondary | ICD-10-CM | POA: Insufficient documentation

## 2013-03-09 MED ORDER — ESOMEPRAZOLE MAGNESIUM 40 MG PO PACK: 40.0000 mg | PACK | Freq: Every day | ORAL | Status: DC

## 2013-03-09 MED ORDER — ROSUVASTATIN CALCIUM 10 MG PO TABS: 10.0000 mg | ORAL_TABLET | ORAL | Status: DC

## 2013-03-09 NOTE — Assessment & Plan Note (Addendum)
Her LDL is currently 141 and liver enzymes, muscle enzyme and thyroid function are normal. She should be on a high potency  statin. She has a history of leg pain with simvastatin. We will try Crestor 10 mg every other day starting next week along with cookie 10. Samples given of Crestor.

## 2013-03-09 NOTE — Assessment & Plan Note (Signed)
Well controlled on current regimen. Renal function stable, no changes today. 

## 2013-03-09 NOTE — Assessment & Plan Note (Signed)
I discussed a trial of Crestor for management of hyperlipidemia given her known coronary artery disease. I suggested that she start cookie 10 a week prior. Samples of Crestor have been given to patient 10 mg. She has had several ER visits for chest pain but has not been admitted due to and normal troponins. No other medication changes have been made today.

## 2013-03-09 NOTE — Assessment & Plan Note (Signed)
Well-controlled on current medications.  hemoglobin A1c has been historically less than 7.0 . sHe is up-to-date on eye exams and his foot exam is norma. l we'll repeat his urine microalbumin to creatinine ratio at next visit. sHe is on the appropriate medications.

## 2013-03-09 NOTE — Assessment & Plan Note (Signed)
We are changing her therapy from over-the-counter Prilosec to Nexium 40 mg daily. I dyspepsia improves, no further workup. if it does not, I will recommend repeat evaluation by GI.

## 2013-03-13 DIAGNOSIS — I4891 Unspecified atrial fibrillation: Secondary | ICD-10-CM | POA: Diagnosis not present

## 2013-03-16 ENCOUNTER — Other Ambulatory Visit: Payer: Self-pay | Admitting: *Deleted

## 2013-03-16 ENCOUNTER — Other Ambulatory Visit: Payer: Medicare Other

## 2013-03-16 DIAGNOSIS — Z1211 Encounter for screening for malignant neoplasm of colon: Secondary | ICD-10-CM

## 2013-03-21 ENCOUNTER — Other Ambulatory Visit (INDEPENDENT_AMBULATORY_CARE_PROVIDER_SITE_OTHER): Payer: Medicare Other

## 2013-03-21 DIAGNOSIS — Z1211 Encounter for screening for malignant neoplasm of colon: Secondary | ICD-10-CM | POA: Diagnosis not present

## 2013-03-21 LAB — FECAL OCCULT BLOOD, IMMUNOCHEMICAL: Fecal Occult Bld: NEGATIVE

## 2013-03-22 ENCOUNTER — Encounter: Payer: Self-pay | Admitting: *Deleted

## 2013-04-03 DIAGNOSIS — I4891 Unspecified atrial fibrillation: Secondary | ICD-10-CM | POA: Diagnosis not present

## 2013-04-04 ENCOUNTER — Telehealth: Payer: Self-pay | Admitting: *Deleted

## 2013-04-04 NOTE — Telephone Encounter (Signed)
Patient daughter called requesting refill on glipiZIDE (GLUCOTROL) 5 MG tablet  This medication is from historical provider on office note from 9/24 stated no changes medication appropriate may I fill?

## 2013-04-05 MED ORDER — GLIPIZIDE 5 MG PO TABS: 5.0000 mg | ORAL_TABLET | Freq: Two times a day (BID) | ORAL | Status: DC

## 2013-04-05 NOTE — Telephone Encounter (Signed)
Spoke with patient daughter and she is aware refill has been sent in

## 2013-04-05 NOTE — Telephone Encounter (Signed)
done

## 2013-04-10 ENCOUNTER — Telehealth: Payer: Self-pay | Admitting: *Deleted

## 2013-04-10 NOTE — Telephone Encounter (Signed)
Refill Request  One touch Ultra blue test strip    One Touch Delica Lancets

## 2013-05-04 DIAGNOSIS — I4891 Unspecified atrial fibrillation: Secondary | ICD-10-CM | POA: Diagnosis not present

## 2013-05-17 ENCOUNTER — Telehealth: Payer: Self-pay | Admitting: Internal Medicine

## 2013-05-17 MED ORDER — LISINOPRIL 20 MG PO TABS: 20.0000 mg | ORAL_TABLET | Freq: Every day | ORAL | Status: DC

## 2013-05-17 MED ORDER — AMLODIPINE BESYLATE 10 MG PO TABS: 10.0000 mg | ORAL_TABLET | Freq: Every day | ORAL | Status: DC

## 2013-05-17 NOTE — Telephone Encounter (Signed)
amLODipine (NORVASC) 10 MG tablet   lisinopril (PRINIVIL,ZESTRIL) 20 MG tablet

## 2013-05-31 ENCOUNTER — Encounter (INDEPENDENT_AMBULATORY_CARE_PROVIDER_SITE_OTHER): Payer: Self-pay

## 2013-05-31 ENCOUNTER — Ambulatory Visit (INDEPENDENT_AMBULATORY_CARE_PROVIDER_SITE_OTHER): Payer: Medicare Other | Admitting: Internal Medicine

## 2013-05-31 VITALS — BP 116/74 | HR 78 | Temp 98.4°F | Wt 146.0 lb

## 2013-05-31 DIAGNOSIS — I10 Essential (primary) hypertension: Secondary | ICD-10-CM

## 2013-05-31 DIAGNOSIS — E785 Hyperlipidemia, unspecified: Secondary | ICD-10-CM

## 2013-05-31 DIAGNOSIS — E119 Type 2 diabetes mellitus without complications: Secondary | ICD-10-CM

## 2013-05-31 DIAGNOSIS — E1059 Type 1 diabetes mellitus with other circulatory complications: Secondary | ICD-10-CM

## 2013-05-31 MED ORDER — LOSARTAN POTASSIUM 50 MG PO TABS: 50.0000 mg | ORAL_TABLET | Freq: Every day | ORAL | Status: DC

## 2013-05-31 NOTE — Patient Instructions (Signed)
The lisinopril may be causing your cough  I am changing the lisinopril to losartan.  You nmay take it once daily in the evening  You can change the amlodipine to morning  Take the crestor every other day  Return after Dec 24th for fasting labs

## 2013-05-31 NOTE — Progress Notes (Signed)
Pre visit review using our clinic review tool, if applicable. No additional management support is needed unless otherwise documented below in the visit note. 

## 2013-05-31 NOTE — Progress Notes (Signed)
Patient ID: Leah Holmes, female   DOB: 07/28/1926, 79 y.o.   MRN: [id number]    Patient Active Problem List   Diagnosis Date Noted  . CAD (coronary artery disease) 03/09/2013  . Type II or unspecified type diabetes mellitus without mention of complication, not stated as uncontrolled 03/09/2013  . Other and unspecified hyperlipidemia 03/09/2013  . Essential hypertension, benign 03/09/2013  . Nonulcer dyspepsia 03/09/2013    Subjective:  CC:   Chief Complaint  Patient presents with  . Follow-up    HPI:   Leah Holmes a 79 y.o. female who presents for 3 month follow up on DM., hyperlipidemia and CAD.  She was prescribed Crestor after last visit for stroke and CAD risk mitigation.  She is tolerating crestor qod but not daily due to muscle aches.  She has been taking Lisinopril but it has been causing a dry cough,.  She checks her sugars occasionally and has not had any deviations below 80 or above 150. She is following a carbohydrate restricted diet and walking regularly.    Past Medical History  Diagnosis Date  . Diabetes mellitus without complication   . Ulcer   . Hyperlipidemia   . Hypertension   . Polyp of colon     Past Surgical History  Procedure Laterality Date  . Cholecystectomy    . Appendectomy    . Breast surgery    . Abdominal hysterectomy         The following portions of the patient's history were reviewed and updated as appropriate: Allergies, current medications, and problem list.    Review of Systems:   12 Pt  review of systems was negative except those addressed in the HPI,     History   Social History  . Marital Status: Widowed    Spouse Name: N/A    Number of Children: N/A  . Years of Education: N/A   Occupational History  . Not on file.   Social History Main Topics  . Smoking status: Never Smoker   . Smokeless tobacco: Current User    Types: Snuff     Comment: occasionally  . Alcohol Use: No  . Drug Use: No  . Sexual  Activity: No   Other Topics Concern  . Not on file   Social History Narrative  . No narrative on file    Objective:  Filed Vitals:   05/31/13 0903  BP: 116/74  Pulse: 78  Temp: 98.4 F (36.9 C)     General appearance: alert, cooperative and appears stated age Ears: normal TM's and external ear canals both ears Throat: lips, mucosa, and tongue normal; teeth and gums normal Neck: no adenopathy, no carotid bruit, supple, symmetrical, trachea midline and thyroid not enlarged, symmetric, no tenderness/mass/nodules Back: symmetric, no curvature. ROM normal. No CVA tenderness. Lungs: clear to auscultation bilaterally Heart: regular rate and rhythm, S1, S2 normal, no murmur, click, rub or gallop Abdomen: soft, non-tender; bowel sounds normal; no masses,  no organomegaly Pulses: 2+ and symmetric Skin: Skin color, texture, turgor normal. No rashes or lesions Lymph nodes: Cervical, supraclavicular, and axillary nodes normal.  Assessment and Plan:  Essential hypertension, benign Well-controlled on lisinopril, but she is having a  nonproductive hacking cough.  We discussed changing her lisinopril to losartan and continuing amlodipine.  Type II or unspecified type diabetes mellitus without mention of complication, not stated as uncontrolled Well-controlled on current medications.  hemoglobin A1c has been historically less than 7.0 . sHe is up-to-date on eye exams  and his foot exam is normal.  She needs a urine microalbumin to creatinine ratio this month. sHe is on the appropriate medications. Lab Results  Component Value Date   HGBA1C 6.3 03/08/2013   No results found for this basename: MICROALBUR, [id number]      Other and unspecified hyperlipidemia Tolerating every other day Crestor for elevated risk for coronary artery disease and stroke due to concurrent diabetes.   Updated Medication List Outpatient Encounter Prescriptions as of 05/31/2013  Medication Sig  . amLODipine  (NORVASC) 10 MG tablet Take 1 tablet (10 mg total) by mouth daily.  Marland Kitchen aspirin 81 MG tablet Take 81 mg by mouth daily.  Marland Kitchen atenolol (TENORMIN) 25 MG tablet Take 25 mg by mouth daily.  Marland Kitchen esomeprazole (NEXIUM) 40 MG packet Take 40 mg by mouth daily before breakfast.  . glipiZIDE (GLUCOTROL) 5 MG tablet Take 1 tablet (5 mg total) by mouth 2 (two) times daily before a meal.  . hydrochlorothiazide (HYDRODIURIL) 25 MG tablet Take 25 mg by mouth daily.  . isosorbide mononitrate (IMDUR) 60 MG 24 hr tablet Take 60 mg by mouth daily.  Marland Kitchen lisinopril (PRINIVIL,ZESTRIL) 20 MG tablet Take 1 tablet (20 mg total) by mouth daily.  . meclizine (ANTIVERT) 25 MG tablet Take 25 mg by mouth 3 (three) times daily as needed.  . nitroGLYCERIN (NITROSTAT) 0.4 MG SL tablet Place 0.4 mg under the tongue every 5 (five) minutes as needed for chest pain.  . rosuvastatin (CRESTOR) 10 MG tablet Take 1 tablet (10 mg total) by mouth every other day.  . warfarin (COUMADIN) 2 MG tablet Take 2 mg by mouth daily.  Marland Kitchen losartan (COZAAR) 50 MG tablet Take 1 tablet (50 mg total) by mouth daily.     Orders Placed This Encounter  Procedures  . Lipid panel  . Hemoglobin A1c  . Microalbumin / creatinine urine ratio  . Comprehensive metabolic panel  . HM DIABETES EYE EXAM  . HM DIABETES FOOT EXAM    No Follow-up on file.

## 2013-06-01 DIAGNOSIS — I4891 Unspecified atrial fibrillation: Secondary | ICD-10-CM | POA: Diagnosis not present

## 2013-06-02 ENCOUNTER — Encounter: Payer: Self-pay | Admitting: Internal Medicine

## 2013-06-02 NOTE — Assessment & Plan Note (Signed)
Well-controlled on lisinopril, but she is having a  nonproductive hacking cough.  We discussed changing her lisinopril to losartan and continuing amlodipine.

## 2013-06-02 NOTE — Assessment & Plan Note (Signed)
Tolerating every other day Crestor for elevated risk for coronary artery disease and stroke due to concurrent diabetes.

## 2013-06-02 NOTE — Assessment & Plan Note (Signed)
Well-controlled on current medications.  hemoglobin A1c has been historically less than 7.0 . sHe is up-to-date on eye exams and his foot exam is normal.  She needs a urine microalbumin to creatinine ratio this month. sHe is on the appropriate medications. Lab Results  Component Value Date   HGBA1C 6.3 03/08/2013   No results found for this basename: MICROALBUR, [id number]

## 2013-06-13 DIAGNOSIS — I2510 Atherosclerotic heart disease of native coronary artery without angina pectoris: Secondary | ICD-10-CM | POA: Diagnosis not present

## 2013-06-13 DIAGNOSIS — I4891 Unspecified atrial fibrillation: Secondary | ICD-10-CM | POA: Diagnosis not present

## 2013-06-13 DIAGNOSIS — I10 Essential (primary) hypertension: Secondary | ICD-10-CM | POA: Diagnosis not present

## 2013-06-13 DIAGNOSIS — Z7901 Long term (current) use of anticoagulants: Secondary | ICD-10-CM | POA: Diagnosis not present

## 2013-06-22 ENCOUNTER — Other Ambulatory Visit: Payer: Medicare Other

## 2013-06-22 DIAGNOSIS — E119 Type 2 diabetes mellitus without complications: Secondary | ICD-10-CM | POA: Diagnosis not present

## 2013-06-23 ENCOUNTER — Other Ambulatory Visit (INDEPENDENT_AMBULATORY_CARE_PROVIDER_SITE_OTHER): Payer: Medicare Other

## 2013-06-23 DIAGNOSIS — E119 Type 2 diabetes mellitus without complications: Secondary | ICD-10-CM

## 2013-06-23 LAB — COMPREHENSIVE METABOLIC PANEL
ALK PHOS: 51 U/L (ref 39–117)
ALT: 24 U/L (ref 0–35)
AST: 28 U/L (ref 0–37)
Albumin: 3.9 g/dL (ref 3.5–5.2)
BUN: 11 mg/dL (ref 6–23)
CO2: 31 meq/L (ref 19–32)
CREATININE: 0.8 mg/dL (ref 0.4–1.2)
Calcium: 9.8 mg/dL (ref 8.4–10.5)
Chloride: 100 mEq/L (ref 96–112)
GFR: 70.45 mL/min (ref >60.00)
GLUCOSE: 118 mg/dL — AB (ref 70–99)
Potassium: 4.5 mEq/L (ref 3.5–5.1)
SODIUM: 137 meq/L (ref 135–145)
TOTAL PROTEIN: 6.7 g/dL (ref 6.0–8.3)
Total Bilirubin: 0.9 mg/dL (ref 0.3–1.2)

## 2013-06-23 LAB — HEMOGLOBIN A1C: Hgb A1c MFr Bld: 6.1 % (ref 4.6–6.5)

## 2013-06-23 LAB — LIPID PANEL
Cholesterol: 163 mg/dL (ref 0–200)
HDL: 62.6 mg/dL (ref >39.00)
LDL Cholesterol: 89 mg/dL (ref 0–99)
Total CHOL/HDL Ratio: 3
Triglycerides: 56 mg/dL (ref 0.0–149.0)
VLDL: 11.2 mg/dL (ref 0.0–40.0)

## 2013-06-23 LAB — MICROALBUMIN / CREATININE URINE RATIO
Creatinine,U: 128.3 mg/dL
MICROALB UR: 0.4 mg/dL (ref 0.0–1.9)
MICROALB/CREAT RATIO: 0.3 mg/g (ref 0.0–30.0)

## 2013-06-26 ENCOUNTER — Encounter: Payer: Self-pay | Admitting: *Deleted

## 2013-06-29 DIAGNOSIS — I4891 Unspecified atrial fibrillation: Secondary | ICD-10-CM | POA: Diagnosis not present

## 2013-07-03 ENCOUNTER — Other Ambulatory Visit: Payer: Self-pay | Admitting: *Deleted

## 2013-07-04 MED ORDER — GLIPIZIDE 5 MG PO TABS: 5.0000 mg | ORAL_TABLET | Freq: Two times a day (BID) | ORAL | Status: DC

## 2013-07-27 DIAGNOSIS — I4891 Unspecified atrial fibrillation: Secondary | ICD-10-CM | POA: Diagnosis not present

## 2013-07-30 LAB — HM DIABETES EYE EXAM

## 2013-08-23 DIAGNOSIS — I4891 Unspecified atrial fibrillation: Secondary | ICD-10-CM | POA: Diagnosis not present

## 2013-08-31 ENCOUNTER — Ambulatory Visit (INDEPENDENT_AMBULATORY_CARE_PROVIDER_SITE_OTHER): Payer: Medicare Other | Admitting: Internal Medicine

## 2013-08-31 ENCOUNTER — Encounter: Payer: Self-pay | Admitting: Internal Medicine

## 2013-08-31 VITALS — BP 114/60 | HR 80 | Temp 97.7°F | Resp 16 | Wt 146.8 lb

## 2013-08-31 DIAGNOSIS — I2510 Atherosclerotic heart disease of native coronary artery without angina pectoris: Secondary | ICD-10-CM | POA: Diagnosis not present

## 2013-08-31 DIAGNOSIS — Z860100 Personal history of colon polyps, unspecified: Secondary | ICD-10-CM | POA: Insufficient documentation

## 2013-08-31 DIAGNOSIS — E119 Type 2 diabetes mellitus without complications: Secondary | ICD-10-CM

## 2013-08-31 DIAGNOSIS — Z23 Encounter for immunization: Secondary | ICD-10-CM | POA: Diagnosis not present

## 2013-08-31 DIAGNOSIS — I10 Essential (primary) hypertension: Secondary | ICD-10-CM

## 2013-08-31 DIAGNOSIS — E559 Vitamin D deficiency, unspecified: Secondary | ICD-10-CM

## 2013-08-31 DIAGNOSIS — R1013 Epigastric pain: Secondary | ICD-10-CM | POA: Diagnosis not present

## 2013-08-31 DIAGNOSIS — K30 Functional dyspepsia: Secondary | ICD-10-CM

## 2013-08-31 DIAGNOSIS — K3189 Other diseases of stomach and duodenum: Secondary | ICD-10-CM

## 2013-08-31 MED ORDER — ESOMEPRAZOLE MAGNESIUM 40 MG PO CPDR: 40.0000 mg | DELAYED_RELEASE_CAPSULE | Freq: Every day | ORAL | Status: DC

## 2013-08-31 NOTE — Assessment & Plan Note (Addendum)
Nonoperable by prior cath 2011.  Recent anginal episodes occur with walking not at rest . Has appt end of April with cardiology.  continuedaily Imdur and  prn ntg

## 2013-08-31 NOTE — Patient Instructions (Addendum)
You are doing well.  Please return for fasting labs after April 9th   Your "congestion" may be from reflux and gas .   Try Mylanta Gas or Gas X  as needed for gas(You must take 2 hrs after any medication to avoid interferign with absorption )   We Will resume nexium daily in the morning   40 mg daily   Take your metamucil at bedtime instead of in the morning to avoid excessive gas from it   You do not need lisinopril or losartan currently   We are referring you to Dr Jamal Collin for your colonoscopy

## 2013-08-31 NOTE — Progress Notes (Signed)
Pre-visit discussion using our clinic review tool. No additional management support is needed unless otherwise documented below in the visit note.  

## 2013-08-31 NOTE — Progress Notes (Signed)
Patient ID: Leah Holmes, female   DOB: 07/30/1926, 80 y.o.   MRN: [id number]  Patient Active Problem List   Diagnosis Date Noted  . Personal history of colonic polyps 08/31/2013  . CAD (coronary artery disease) 03/09/2013  . Type II or unspecified type diabetes mellitus without mention of complication, not stated as uncontrolled 03/09/2013  . Other and unspecified hyperlipidemia 03/09/2013  . Essential hypertension, benign 03/09/2013  . Nonulcer dyspepsia 03/09/2013    Subjective:  CC:   Chief Complaint  Patient presents with  . Follow-up    3 month    HPI:   Leah Holmes is a 80 y.o. female who presents for 3 month follow up on CAD with DM 2,  hyperlipidemia and hypertension  HAS HAD ANGINA SEVERAL TIMES THIS WEEK ,  RELIEVED WITH 1 DOSE OF NTG. EXCEPT ON Friday HAD TO TAKE 2 .  Her symptoms are ocurring with aitivity (walking)     Legs not working like they used to .  They wake her up several times per week with aching pain .   legs feel weak a lot.  (Pulses are strong  Foot exam is normal )  DM: Post prandials are > 150 AND < 200  50% OF THE TIME   HTN:  She did not tolerate change from lisinopril to losartan, which was stopped due to cough. She is taking amlodipine prn bc daily use makes her constipated and coats her tongue white and causes food to taste bad    Cc  Bleeding hemorrhoids accompanied  by stinging sensation.  Has been recurrent for the past several weeks.  She has chronic constipation, managed with daily metamucil.  Her last colonoscopy was 15 yrs ago at age 39 and polyps were found,  No local GI.  Referral to Gen Surgery was discussred and her daughtetr has requested Dr. Jamal Collin.    Cc #2:  Dyspepsia:  Stomach bothers her a lot,  Feels full of air a lot and hurts . She has a history of hiatal hernia,.  Feels like she needs to belch but doesn't.  No dysphagia or regurgitation but has occasional reflux of sour tasting material.  Diet reviewed,  She eatss carrots,  potatoes and green beans, both  fresh and in   Soups  .  Nexium has helped, but she had to switch to generic prilosec because of iwwurance noncoverage and symptom have worsened on generic 20 mg prilosec.           Past Medical History  Diagnosis Date  . Diabetes mellitus without complication   . Ulcer   . Hyperlipidemia   . Hypertension   . Polyp of colon     Past Surgical History  Procedure Laterality Date  . Cholecystectomy    . Appendectomy    . Breast surgery    . Abdominal hysterectomy         The following portions of the patient's history were reviewed and updated as appropriate: Allergies, current medications, and problem list.    Review of Systems:   Patient denies headache, fevers, malaise, unintentional weight loss, skin rash, eye pain, sinus congestion and sinus pain, sore throat, dysphagia,  hemoptysis , cough, dyspnea, wheezing, chest pain, palpitations, orthopnea, edema, abdominal pain, nausea, melena, diarrhea, constipation, flank pain, dysuria, hematuria, urinary  Frequency, nocturia, numbness, tingling, seizures,  Focal weakness, Loss of consciousness,  Tremor, insomnia, depression, anxiety, and suicidal ideation.     History   Social History  . Marital Status:  Widowed    Spouse Name: N/A    Number of Children: N/A  . Years of Education: N/A   Occupational History  . Not on file.   Social History Main Topics  . Smoking status: Never Smoker   . Smokeless tobacco: Current User    Types: Snuff     Comment: occasionally  . Alcohol Use: No  . Drug Use: No  . Sexual Activity: No   Other Topics Concern  . Not on file   Social History Narrative  . No narrative on file    Objective:  Filed Vitals:   08/31/13 1103  BP: 114/60  Pulse: 80  Temp: 97.7 F (36.5 C)  Resp: 16     General appearance: alert, cooperative and appears stated age Ears: normal TM's and external ear canals both ears Throat: lips, mucosa, and tongue normal; teeth  and gums normal Neck: no adenopathy, no carotid bruit, supple, symmetrical, trachea midline and thyroid not enlarged, symmetric, no tenderness/mass/nodules Back: symmetric, no curvature. ROM normal. No CVA tenderness. Lungs: clear to auscultation bilaterally Heart: regular rate and rhythm, S1, S2 normal, no murmur, click, rub or gallop Abdomen: soft, non-tender; bowel sounds normal; no masses,  no organomegaly Pulses: 2+ and symmetric Skin: Skin color, texture, turgor normal. No rashes or lesions Lymph nodes: Cervical, supraclavicular, and axillary nodes normal.  Assessment and Plan:  CAD (coronary artery disease) Nonoperable by prior cath 2011.  Recent anginal episodes occur with walking not at rest . Has appt end of April with cardiology.  continuedaily Imdur and  prn ntg  Essential hypertension, benign Well controlled on current regimen. Renal function stable, no changes today.  Lab Results  Component Value Date   CREATININE 0.8 06/23/2013   Lab Results  Component Value Date   NA 137 06/23/2013   K 4.5 06/23/2013   CL 100 06/23/2013   CO2 31 06/23/2013     Nonulcer dyspepsia Worsening on 20 mg prilosec.  PA for nexium initiated,   Personal history of colonic polyps She had polyps on colonoscopy at age 48 and has been havin rectal bleeding for the last several weeks.  Referral to [facility] for anuscopy vs complete colonoscopy   Type II or unspecified type diabetes mellitus without mention of complication, not stated as uncontrolled  well-controlled on glipizide. Patient is up-to-date on eye exams and foot exam is normal today. Patient has no  Microalbuminuria by January testing .  Patient is tolerating statin therapy for CAD risk reduction but but has not tolerated  ACE/ARB due to cough and hypotension.  Foot exam is normal. no changes today   A total of 40 minutes was spent with patient more than half of which was spent in counseling, reviewing records from other prviders and  coordination of care.  Updated Medication List Outpatient Encounter Prescriptions as of 08/31/2013  Medication Sig  . amLODipine (NORVASC) 10 MG tablet Take 1 tablet (10 mg total) by mouth daily.  Marland Kitchen aspirin 81 MG tablet Take 81 mg by mouth daily.  Marland Kitchen atenolol (TENORMIN) 25 MG tablet Take 25 mg by mouth daily.  Marland Kitchen glipiZIDE (GLUCOTROL) 5 MG tablet Take 1 tablet (5 mg total) by mouth 2 (two) times daily before a meal.  . hydrochlorothiazide (HYDRODIURIL) 25 MG tablet Take 25 mg by mouth daily.  . isosorbide mononitrate (IMDUR) 60 MG 24 hr tablet Take 60 mg by mouth daily.  . meclizine (ANTIVERT) 25 MG tablet Take 25 mg by mouth 3 (three) times daily as  needed.  . nitroGLYCERIN (NITROSTAT) 0.4 MG SL tablet Place 0.4 mg under the tongue every 5 (five) minutes as needed for chest pain.  . rosuvastatin (CRESTOR) 10 MG tablet Take 1 tablet (10 mg total) by mouth every other day.  . warfarin (COUMADIN) 2 MG tablet Take 2 mg by mouth daily.  Marland Kitchen esomeprazole (NEXIUM) 40 MG capsule Take 1 capsule (40 mg total) by mouth daily.  . [DISCONTINUED] esomeprazole (NEXIUM) 40 MG packet Take 40 mg by mouth daily before breakfast.  . [DISCONTINUED] lisinopril (PRINIVIL,ZESTRIL) 20 MG tablet Take 1 tablet (20 mg total) by mouth daily.  . [DISCONTINUED] losartan (COZAAR) 50 MG tablet Take 1 tablet (50 mg total) by mouth daily.     Orders Placed This Encounter  Procedures  . Pneumococcal conjugate vaccine 13-valent  . Comprehensive metabolic panel  . Lipid panel  . Vit D  25 hydroxy (rtn osteoporosis monitoring)  . Hemoglobin A1c  . Ambulatory referral to General Surgery  . HM DIABETES EYE EXAM    No Follow-up on file.

## 2013-09-01 ENCOUNTER — Telehealth: Payer: Self-pay | Admitting: Internal Medicine

## 2013-09-01 NOTE — Telephone Encounter (Signed)
Relevant patient education mailed to patient.  

## 2013-09-03 ENCOUNTER — Encounter: Payer: Self-pay | Admitting: Internal Medicine

## 2013-09-03 NOTE — Assessment & Plan Note (Signed)
Well controlled on current regimen. Renal function stable, no changes today.  Lab Results  Component Value Date   CREATININE 0.8 06/23/2013   Lab Results  Component Value Date   NA 137 06/23/2013   K 4.5 06/23/2013   CL 100 06/23/2013   CO2 31 06/23/2013

## 2013-09-03 NOTE — Assessment & Plan Note (Signed)
Worsening on 20 mg prilosec.  PA for nexium initiated,   

## 2013-09-03 NOTE — Assessment & Plan Note (Signed)
She had polyps on colonoscopy at age 80 and has been havin rectal bleeding for the last several weeks.  Referral to [facility] for anuscopy vs complete colonoscopy

## 2013-09-03 NOTE — Assessment & Plan Note (Addendum)
well-controlled on glipizide. Patient is up-to-date on eye exams and foot exam is normal today. Patient has no  Microalbuminuria by January testing .  Patient is tolerating statin therapy for CAD risk reduction but but has not tolerated  ACE/ARB due to cough and hypotension.  Foot exam is normal. no changes today

## 2013-09-14 ENCOUNTER — Ambulatory Visit: Payer: Self-pay | Admitting: General Surgery

## 2013-09-20 ENCOUNTER — Ambulatory Visit (INDEPENDENT_AMBULATORY_CARE_PROVIDER_SITE_OTHER): Payer: Medicare Other | Admitting: General Surgery

## 2013-09-20 ENCOUNTER — Encounter: Payer: Self-pay | Admitting: General Surgery

## 2013-09-20 VITALS — BP 108/58 | HR 74 | Resp 12 | Ht 66.0 in | Wt 146.0 lb

## 2013-09-20 DIAGNOSIS — R1310 Dysphagia, unspecified: Secondary | ICD-10-CM | POA: Insufficient documentation

## 2013-09-20 DIAGNOSIS — K621 Rectal polyp: Secondary | ICD-10-CM

## 2013-09-20 DIAGNOSIS — K620 Anal polyp: Secondary | ICD-10-CM | POA: Diagnosis not present

## 2013-09-20 DIAGNOSIS — K625 Hemorrhage of anus and rectum: Secondary | ICD-10-CM | POA: Diagnosis not present

## 2013-09-20 DIAGNOSIS — I4891 Unspecified atrial fibrillation: Secondary | ICD-10-CM | POA: Diagnosis not present

## 2013-09-20 LAB — POC HEMOCCULT BLD/STL (OFFICE/1-CARD/DIAGNOSTIC): Card #1 Date: NEGATIVE

## 2013-09-20 NOTE — Progress Notes (Signed)
Patient ID: Leah Holmes, female   DOB: 03/18/1927, 80 y.o.   MRN: [id number]  Chief Complaint  Patient presents with  . Colonoscopy    HPI Leah Holmes is a 80 y.o. female.  Here today to discuss having a colonoscopy.  States she has regular bowel movements and occasional blood on stool and toilet paper if she is having constipation, maybe once every 2 week. Last colonoscopy was 2003 in [place]. She does admit to having GERD and upper epigastric pain. States "feels like the food does not go all the way down". Over the past 2 weeks she has belched and some food comes back up as well. She does admit to a lot of flatus as well. Guaiac negative at Dr Derrel Nip office. History of H. Pylori over 10 years ago. Here today with her daughter [name], RN, who works at [employer]. She does have occasional abdominal pain at night but seems to be controlled with Prilosec.  She uses glycerin suppositories for her hemorrhoids. The patient denies any significant pain with her bowel movements.   HPI  Past Medical History  Diagnosis Date  . Diabetes mellitus without complication   . Ulcer   . Hyperlipidemia   . Hypertension   . Polyp of colon   . Atrial fibrillation 2012  . GERD (gastroesophageal reflux disease)   . Irritable bowel syndrome   . Colon polyp 2003    Past Surgical History  Procedure Laterality Date  . Appendectomy    . Breast surgery    . Abdominal hysterectomy    . Cholecystectomy  03-2012  . Colonoscopy  2003    Dr Theodosia Paling in [place]  . Upper gi endoscopy  2003    Dr Theodosia Paling in [place]  . Fibroid tumor    . Abdominal adhesion surgery      Family History  Problem Relation Age of Onset  . Cancer Sister 70    breast    Social History History  Substance Use Topics  . Smoking status: Never Smoker   . Smokeless tobacco: Current User    Types: Snuff     Comment: occasionally  . Alcohol Use: No    Allergies  Allergen Reactions  .  Penicillins Anaphylaxis  . Clonidine Derivatives   . Gabapentin Nausea Only  . Lisinopril     cough  . Reglan [Metoclopramide] Other (See Comments)    Tremors and patient continues to have tremors  . Hydralazine Anxiety    Current Outpatient Prescriptions  Medication Sig Dispense Refill  . amLODipine (NORVASC) 10 MG tablet Take 1 tablet (10 mg total) by mouth daily.  30 tablet  5  . aspirin 81 MG tablet Take 81 mg by mouth daily.      Marland Kitchen atenolol (TENORMIN) 25 MG tablet Take 25 mg by mouth daily.      Marland Kitchen glipiZIDE (GLUCOTROL) 5 MG tablet Take 1 tablet (5 mg total) by mouth 2 (two) times daily before a meal.  180 tablet  1  . glycerin adult (GLYCERIN ADULT) 2 G SUPP Place 1 suppository rectally once as needed for moderate constipation.      . hydrochlorothiazide (HYDRODIURIL) 25 MG tablet Take 25 mg by mouth daily.      . isosorbide mononitrate (IMDUR) 60 MG 24 hr tablet Take 60 mg by mouth daily.      . meclizine (ANTIVERT) 25 MG tablet Take 25 mg by mouth 3 (three) times daily as needed.      Marland Kitchen  nitroGLYCERIN (NITROSTAT) 0.4 MG SL tablet Place 0.4 mg under the tongue every 5 (five) minutes as needed for chest pain.      Marland Kitchen omeprazole (PRILOSEC) 20 MG capsule Take 20 mg by mouth daily.      . psyllium (METAMUCIL) 58.6 % powder Take 1 packet by mouth daily.      . rosuvastatin (CRESTOR) 10 MG tablet Take 1 tablet (10 mg total) by mouth every other day.  90 tablet  3  . warfarin (COUMADIN) 2 MG tablet Take 2 mg by mouth daily.       No current facility-administered medications for this visit.    Review of Systems Review of Systems  Constitutional: Negative.   Respiratory: Negative.   Cardiovascular: Negative.   Gastrointestinal: Positive for constipation. Negative for nausea and vomiting.    Blood pressure 108/58, pulse 74, resp. rate 12, height 5\' 6"  (1.676 m), weight 146 lb (66.225 kg).  Physical Exam Physical Exam  Constitutional: She is oriented to person, place, and time. She  appears well-developed and well-nourished.  Neck: Neck supple.  Cardiovascular: Normal rate, normal heart sounds and intact distal pulses.  An irregular rhythm present.  No lower leg edema  Pulmonary/Chest: Effort normal and breath sounds normal.  Abdominal: Soft. Normal appearance and bowel sounds are normal. There is no tenderness. A hernia (umbilical) is present.  Genitourinary: Rectum normal. Guaiac negative stool.  Anal polyp anterior .   Lymphadenopathy:    She has no cervical adenopathy.  Neurological: She is alert and oriented to person, place, and time.  Skin: Skin is warm and dry.    Data Reviewed Hemoccult-negative stool.  Anoscopy shows a 1.5 cm long, with a 4 mm base benign appearing anal polyp on the anterior rectal wall. No bleeding or tenderness. Normal sphincter tone. Visualize lower rectal mucosa was normal.  Handwritten colonoscopy note from April 11, 2002 at [facility] reported a 4 mm polyp that was removed as well as internal hemorrhoids. Pathology was not available.  Assessment    History of intermittent rectal bleeding.  Dysphasia, variably symptomatic based on whether intervention was proposed.    Plan    With the patient reported the need to regurgitate food at times (when she too much) at a minimum a barium swallow was recommended to help assess for thick stricture that would benefit from dilatation (which she has done well within the past) ORIF esophageal dysmotility is present. She did not desire endoscopy at this time.  With stool Hemoccults negative x2, I would be reluctant to propose a screening colonoscopy for bleeding considering her history of atrial fibrillation requiring anticoagulation as well as diabetes. We will attempt to get the pathology from her 2003 colonoscopy.     Patient to have a barium swallow at [facility] on 10-10-13 at 9:30 am (arrive 9 am). Prep: NPO after midnight. The patient and her daughter are aware of all  instructions. (Please note: The patient has requested to wait till the above date.)  She will be contacted after the barium swallow is completed. She is aware there may be a delay due to my being out of town at the end of the month.  PCP: Namon Cirri Darianne Muralles 09/20/2013, 9:16 PM

## 2013-09-20 NOTE — Patient Instructions (Addendum)
The patient is aware to call back for any questions or concerns.  Barium Swallow A barium swallow is an X-ray test. It is used to check your throat and the esophagus. BEFORE THE TEST  Do not eat or drink after midnight before the test.  Do not chew gum or smoke after midnight before the test.  Ask your doctor about changing or stopping your medicines. TEST You will put on a hospital gown and lie on a table. You will drink a white liquid called barium. The doctor will take X-ray pictures. The pictures show the barium as it passes through your body. The doctor will ask you to hold still during the test. AFTER THE TEST  Your poop (stools) may be gray or white for 1 to 3 days.  Take your regular medicines.  Eat a normal diet and go back to normal activities.  Drink enough fluids to keep your pee (urine) clear or pale yellow.  Ask when your test results will be ready. Make sure you get your test results. GET HELP RIGHT AWAY IF:  You cannot poop (constipated).  You have trouble swallowing.  You feel sick to your stomach (nauseous) or you throw up (vomit).  You have chest pain or belly (abdominal) pain.  You feel lightheaded, have unusual sweating, or you pass out (faint). MAKE SURE YOU:  Understand these instructions.  Will watch your condition.  Will get help right away if you are not doing well or get worse. Document Released: 07/04/2010 Document Revised: 08/24/2011 Document Reviewed: 07/04/2010 [facility] Patient Information 2014 [place], [place].  Patient to have a barium swallow at [facility] on 10-10-13 at 9:30 am (arrive 9 am). Prep: NPO after midnight. The patient and her daughter are aware of all instructions.

## 2013-09-21 LAB — HM DIABETES EYE EXAM

## 2013-09-22 ENCOUNTER — Other Ambulatory Visit (INDEPENDENT_AMBULATORY_CARE_PROVIDER_SITE_OTHER): Payer: Medicare Other

## 2013-09-22 DIAGNOSIS — E119 Type 2 diabetes mellitus without complications: Secondary | ICD-10-CM

## 2013-09-22 DIAGNOSIS — I2510 Atherosclerotic heart disease of native coronary artery without angina pectoris: Secondary | ICD-10-CM

## 2013-09-22 DIAGNOSIS — E559 Vitamin D deficiency, unspecified: Secondary | ICD-10-CM

## 2013-09-22 LAB — COMPREHENSIVE METABOLIC PANEL
ALT: 22 U/L (ref 0–35)
AST: 23 U/L (ref 0–37)
Albumin: 3.6 g/dL (ref 3.5–5.2)
Alkaline Phosphatase: 53 U/L (ref 39–117)
BILIRUBIN TOTAL: 0.9 mg/dL (ref 0.3–1.2)
BUN: 11 mg/dL (ref 6–23)
CO2: 31 mEq/L (ref 19–32)
Calcium: 9.4 mg/dL (ref 8.4–10.5)
Chloride: 99 mEq/L (ref 96–112)
Creatinine, Ser: 0.7 mg/dL (ref 0.4–1.2)
GFR: 90.45 mL/min (ref >60.00)
GLUCOSE: 124 mg/dL — AB (ref 70–99)
Potassium: 3.7 mEq/L (ref 3.5–5.1)
Sodium: 137 mEq/L (ref 135–145)
Total Protein: 6.1 g/dL (ref 6.0–8.3)

## 2013-09-22 LAB — LIPID PANEL
CHOLESTEROL: 151 mg/dL (ref 0–200)
HDL: 58.8 mg/dL (ref >39.00)
LDL Cholesterol: 81 mg/dL (ref 0–99)
Total CHOL/HDL Ratio: 3
Triglycerides: 58 mg/dL (ref 0.0–149.0)
VLDL: 11.6 mg/dL (ref 0.0–40.0)

## 2013-09-22 LAB — HEMOGLOBIN A1C: Hgb A1c MFr Bld: 6.5 % (ref 4.6–6.5)

## 2013-09-23 LAB — VITAMIN D 25 HYDROXY (VIT D DEFICIENCY, FRACTURES): Vit D, 25-Hydroxy: 34 ng/mL (ref 30–89)

## 2013-09-25 ENCOUNTER — Encounter: Payer: Self-pay | Admitting: *Deleted

## 2013-10-02 ENCOUNTER — Telehealth: Payer: Self-pay

## 2013-10-02 NOTE — Telephone Encounter (Signed)
Patients daughter called and states that the patient does not want to have a Barium Swallow completed at this time. She will call back if she would like to reschedule this.

## 2013-10-02 NOTE — Telephone Encounter (Signed)
The patient originally declined endoscopic evaluation. She was originally amenable to a barium swallow to assess her progressive dysphagia. Her daughter, Debera Lat, RN has called to report her mother does not want to proceed with this evaluation.  Followup here will be on an as-needed basis.

## 2013-10-10 ENCOUNTER — Other Ambulatory Visit: Payer: Self-pay | Admitting: Internal Medicine

## 2013-10-10 MED ORDER — MECLIZINE HCL 25 MG PO TABS: 25.0000 mg | ORAL_TABLET | Freq: Three times a day (TID) | ORAL | Status: DC | PRN

## 2013-10-10 NOTE — Telephone Encounter (Signed)
Patient DPR called for refill on Meclizine refill sent

## 2013-10-11 DIAGNOSIS — E782 Mixed hyperlipidemia: Secondary | ICD-10-CM | POA: Diagnosis not present

## 2013-10-11 DIAGNOSIS — I119 Hypertensive heart disease without heart failure: Secondary | ICD-10-CM | POA: Diagnosis not present

## 2013-10-11 DIAGNOSIS — I4891 Unspecified atrial fibrillation: Secondary | ICD-10-CM | POA: Diagnosis not present

## 2013-10-11 DIAGNOSIS — R002 Palpitations: Secondary | ICD-10-CM | POA: Diagnosis not present

## 2013-10-18 DIAGNOSIS — I4891 Unspecified atrial fibrillation: Secondary | ICD-10-CM | POA: Diagnosis not present

## 2013-11-06 ENCOUNTER — Emergency Department: Payer: Self-pay | Admitting: Emergency Medicine

## 2013-11-06 DIAGNOSIS — I10 Essential (primary) hypertension: Secondary | ICD-10-CM | POA: Diagnosis not present

## 2013-11-06 DIAGNOSIS — Z880 Allergy status to penicillin: Secondary | ICD-10-CM | POA: Diagnosis not present

## 2013-11-06 DIAGNOSIS — R0789 Other chest pain: Secondary | ICD-10-CM | POA: Diagnosis not present

## 2013-11-06 DIAGNOSIS — E876 Hypokalemia: Secondary | ICD-10-CM | POA: Diagnosis not present

## 2013-11-06 DIAGNOSIS — E875 Hyperkalemia: Secondary | ICD-10-CM | POA: Diagnosis not present

## 2013-11-06 DIAGNOSIS — Z79899 Other long term (current) drug therapy: Secondary | ICD-10-CM | POA: Diagnosis not present

## 2013-11-06 DIAGNOSIS — R52 Pain, unspecified: Secondary | ICD-10-CM | POA: Diagnosis not present

## 2013-11-06 DIAGNOSIS — Z888 Allergy status to other drugs, medicaments and biological substances status: Secondary | ICD-10-CM | POA: Diagnosis not present

## 2013-11-06 DIAGNOSIS — J449 Chronic obstructive pulmonary disease, unspecified: Secondary | ICD-10-CM | POA: Diagnosis not present

## 2013-11-06 DIAGNOSIS — Z7982 Long term (current) use of aspirin: Secondary | ICD-10-CM | POA: Diagnosis not present

## 2013-11-06 DIAGNOSIS — E871 Hypo-osmolality and hyponatremia: Secondary | ICD-10-CM | POA: Diagnosis not present

## 2013-11-06 DIAGNOSIS — E119 Type 2 diabetes mellitus without complications: Secondary | ICD-10-CM | POA: Diagnosis not present

## 2013-11-06 DIAGNOSIS — R079 Chest pain, unspecified: Secondary | ICD-10-CM | POA: Diagnosis not present

## 2013-11-06 LAB — COMPREHENSIVE METABOLIC PANEL
ALBUMIN: 3.5 g/dL (ref 3.4–5.0)
ALK PHOS: 64 U/L
AST: 23 U/L (ref 15–37)
Anion Gap: 11 (ref 7–16)
BUN: 11 mg/dL (ref 7–18)
Bilirubin,Total: 0.5 mg/dL (ref 0.2–1.0)
CHLORIDE: 92 mmol/L — AB (ref 98–107)
CO2: 26 mmol/L (ref 21–32)
CREATININE: 0.71 mg/dL (ref 0.60–1.30)
Calcium, Total: 8.9 mg/dL (ref 8.5–10.1)
EGFR (Non-African Amer.): >60
Glucose: 175 mg/dL — ABNORMAL HIGH (ref 65–99)
OSMOLALITY: 263 (ref 275–301)
POTASSIUM: 3.2 mmol/L — AB (ref 3.5–5.1)
SGPT (ALT): 27 U/L (ref 12–78)
Sodium: 129 mmol/L — ABNORMAL LOW (ref 136–145)
Total Protein: 6.8 g/dL (ref 6.4–8.2)

## 2013-11-06 LAB — PROTIME-INR
INR: 2.5
PROTHROMBIN TIME: 26.4 s — AB (ref 11.5–14.7)

## 2013-11-06 LAB — CBC
HCT: 42.2 % (ref 35.0–47.0)
HGB: 14 g/dL (ref 12.0–16.0)
MCH: 30.6 pg (ref 26.0–34.0)
MCHC: 33.1 g/dL (ref 32.0–36.0)
MCV: 93 fL (ref 80–100)
Platelet: 227 10*3/uL (ref 150–440)
RBC: 4.55 10*6/uL (ref 3.80–5.20)
RDW: 13.8 % (ref 11.5–14.5)
WBC: 6.7 10*3/uL (ref 3.6–11.0)

## 2013-11-06 LAB — TROPONIN I: Troponin-I: <0.02 ng/mL

## 2013-11-06 LAB — PRO B NATRIURETIC PEPTIDE: B-Type Natriuretic Peptide: 1264 pg/mL — ABNORMAL HIGH (ref 0–450)

## 2013-11-07 DIAGNOSIS — E876 Hypokalemia: Secondary | ICD-10-CM | POA: Diagnosis not present

## 2013-11-07 DIAGNOSIS — R0602 Shortness of breath: Secondary | ICD-10-CM | POA: Diagnosis not present

## 2013-11-07 DIAGNOSIS — R079 Chest pain, unspecified: Secondary | ICD-10-CM | POA: Diagnosis not present

## 2013-11-07 DIAGNOSIS — I4891 Unspecified atrial fibrillation: Secondary | ICD-10-CM | POA: Diagnosis not present

## 2013-11-16 DIAGNOSIS — I4891 Unspecified atrial fibrillation: Secondary | ICD-10-CM | POA: Diagnosis not present

## 2013-11-23 DIAGNOSIS — R0602 Shortness of breath: Secondary | ICD-10-CM | POA: Diagnosis not present

## 2013-11-23 DIAGNOSIS — R079 Chest pain, unspecified: Secondary | ICD-10-CM | POA: Diagnosis not present

## 2013-11-23 DIAGNOSIS — I4891 Unspecified atrial fibrillation: Secondary | ICD-10-CM | POA: Diagnosis not present

## 2013-11-23 DIAGNOSIS — I2510 Atherosclerotic heart disease of native coronary artery without angina pectoris: Secondary | ICD-10-CM | POA: Diagnosis not present

## 2013-11-23 DIAGNOSIS — I10 Essential (primary) hypertension: Secondary | ICD-10-CM | POA: Diagnosis not present

## 2013-11-23 DIAGNOSIS — I517 Cardiomegaly: Secondary | ICD-10-CM | POA: Insufficient documentation

## 2013-12-11 ENCOUNTER — Ambulatory Visit: Payer: Medicare Other | Admitting: Internal Medicine

## 2013-12-14 DIAGNOSIS — I4891 Unspecified atrial fibrillation: Secondary | ICD-10-CM | POA: Diagnosis not present

## 2013-12-18 ENCOUNTER — Encounter (INDEPENDENT_AMBULATORY_CARE_PROVIDER_SITE_OTHER): Payer: Self-pay

## 2013-12-18 ENCOUNTER — Ambulatory Visit (INDEPENDENT_AMBULATORY_CARE_PROVIDER_SITE_OTHER): Payer: Medicare Other | Admitting: Internal Medicine

## 2013-12-18 ENCOUNTER — Encounter: Payer: Self-pay | Admitting: Internal Medicine

## 2013-12-18 VITALS — BP 142/70 | HR 85 | Temp 97.8°F | Resp 16 | Wt 145.8 lb

## 2013-12-18 DIAGNOSIS — I10 Essential (primary) hypertension: Secondary | ICD-10-CM

## 2013-12-18 DIAGNOSIS — E119 Type 2 diabetes mellitus without complications: Secondary | ICD-10-CM

## 2013-12-18 DIAGNOSIS — Z1239 Encounter for other screening for malignant neoplasm of breast: Secondary | ICD-10-CM | POA: Diagnosis not present

## 2013-12-18 DIAGNOSIS — E785 Hyperlipidemia, unspecified: Secondary | ICD-10-CM

## 2013-12-18 DIAGNOSIS — I2510 Atherosclerotic heart disease of native coronary artery without angina pectoris: Secondary | ICD-10-CM

## 2013-12-18 LAB — COMPREHENSIVE METABOLIC PANEL
ALT: 21 U/L (ref 0–35)
AST: 26 U/L (ref 0–37)
Albumin: 3.8 g/dL (ref 3.5–5.2)
Alkaline Phosphatase: 51 U/L (ref 39–117)
BUN: 12 mg/dL (ref 6–23)
CALCIUM: 9.3 mg/dL (ref 8.4–10.5)
CHLORIDE: 104 meq/L (ref 96–112)
CO2: 30 meq/L (ref 19–32)
Creatinine, Ser: 0.8 mg/dL (ref 0.4–1.2)
GFR: 73.46 mL/min (ref >60.00)
Glucose, Bld: 143 mg/dL — ABNORMAL HIGH (ref 70–99)
Potassium: 3.9 mEq/L (ref 3.5–5.1)
SODIUM: 139 meq/L (ref 135–145)
Total Bilirubin: 0.6 mg/dL (ref 0.2–1.2)
Total Protein: 6.7 g/dL (ref 6.0–8.3)

## 2013-12-18 LAB — MICROALBUMIN / CREATININE URINE RATIO
Creatinine,U: 189.3 mg/dL
MICROALB/CREAT RATIO: 0.6 mg/g (ref 0.0–30.0)
Microalb, Ur: 1.1 mg/dL (ref 0.0–1.9)

## 2013-12-18 LAB — LIPID PANEL
CHOL/HDL RATIO: 2
CHOLESTEROL: 159 mg/dL (ref 0–200)
HDL: 64.2 mg/dL (ref >39.00)
LDL CALC: 79 mg/dL (ref 0–99)
NonHDL: 94.8
TRIGLYCERIDES: 79 mg/dL (ref 0.0–149.0)
VLDL: 15.8 mg/dL (ref 0.0–40.0)

## 2013-12-18 LAB — HM DIABETES FOOT EXAM: HM Diabetic Foot Exam: NORMAL

## 2013-12-18 NOTE — Progress Notes (Signed)
Patient ID: Leah Holmes, female   DOB: 28-Feb-1927, 80 y.o.   MRN: [id number]  Patient Active Problem List   Diagnosis Date Noted  . Rectal bleeding 09/20/2013  . Dysphagia, unspecified(787.20) 09/20/2013  . Anal polyp 09/20/2013  . Personal history of colonic polyps 08/31/2013  . CAD (coronary artery disease) 03/09/2013  . Type II or unspecified type diabetes mellitus without mention of complication, not stated as uncontrolled 03/09/2013  . Other and unspecified hyperlipidemia 03/09/2013  . Essential hypertension, benign 03/09/2013  . Nonulcer dyspepsia 03/09/2013    Subjective:  CC:   Chief Complaint  Patient presents with  . Follow-up  . Diabetes    HPI:   Leah Holmes is a 80 y.o. female who presents for  Follow up on DM Type 2,  Hypertension.  Accompanied by daughter.    DM:  She has been taking glipizide as directed before meals twice daily .  Her blood sugars were reviewed today. :  Post prandials have been elevated to  220.  fastings < 116 regularly.  Diet reviewed:  "Not a big meat eater" Eats vegetable soup without pasta.  Serving of Cornbread and  potato daily, desserts are usually  sugar free cookies.  Neuropathy pain in feet is mild and  relieved with otc cream Foot exam normal  Last eye exam was feb 2015    Hypertension:   Not taking losartan due to adverse effects ("it just made me feel bad")  Amlodipine caused a white coating on her tongue and made her esophagus feel raw so she stopped it.  Using atenolol 25 mg daily ,  Needs a prn for elevations.  Home bps have spiked to 200/100 at times.    Health maintenance: Last mammo 6 24  In [place]   Past Medical History  Diagnosis Date  . Diabetes mellitus without complication   . Ulcer   . Hyperlipidemia   . Hypertension   . Polyp of colon   . Atrial fibrillation 2012  . GERD (gastroesophageal reflux disease)   . Irritable bowel syndrome   . Colon polyp 2003    Past Surgical History  Procedure  Laterality Date  . Appendectomy    . Breast surgery    . Abdominal hysterectomy    . Cholecystectomy  03-2012  . Colonoscopy  2003    Dr Theodosia Paling in [place]  . Upper gi endoscopy  2003    Dr Theodosia Paling in [place]  . Fibroid tumor    . Abdominal adhesion surgery         The following portions of the patient's history were reviewed and updated as appropriate: Allergies, current medications, and problem list.    Review of Systems:   Patient denies headache, fevers, malaise, unintentional weight loss, skin rash, eye pain, sinus congestion and sinus pain, sore throat, dysphagia,  hemoptysis , cough, dyspnea, wheezing, chest pain, palpitations, orthopnea, edema, abdominal pain, nausea, melena, diarrhea, constipation, flank pain, dysuria, hematuria, urinary  Frequency, nocturia, numbness, tingling, seizures,  Focal weakness, Loss of consciousness,  Tremor, insomnia, depression, anxiety, and suicidal ideation.     History   Social History  . Marital Status: Widowed    Spouse Name: N/A    Number of Children: N/A  . Years of Education: N/A   Occupational History  . Not on file.   Social History Main Topics  . Smoking status: Never Smoker   . Smokeless tobacco: Current User    Types: Snuff     Comment: occasionally  .  Alcohol Use: No  . Drug Use: No  . Sexual Activity: No   Other Topics Concern  . Not on file   Social History Narrative  . No narrative on file    Objective:  Filed Vitals:   12/18/13 0805  BP: 142/70  Pulse: 85  Temp: 97.8 F (36.6 C)  Resp: 16     General appearance: alert, cooperative and appears stated age Ears: normal TM's and external ear canals both ears Throat: lips, mucosa, and tongue normal; teeth and gums normal Neck: no adenopathy, no carotid bruit, supple, symmetrical, trachea midline and thyroid not enlarged, symmetric, no tenderness/mass/nodules Back: symmetric, no curvature. ROM normal. No CVA tenderness. Lungs: clear to auscultation  bilaterally Heart: regular rate and rhythm, S1, S2 normal, no murmur, click, rub or gallop Abdomen: soft, non-tender; bowel sounds normal; no masses,  no organomegaly Pulses: 2+ and symmetric Skin: Skin color, texture, turgor normal. No rashes or lesions Lymph nodes: Cervical, supraclavicular, and axillary nodes normal.  Assessment and Plan:  Essential hypertension, benign Did not tolerate losartan,  Made her feel sick,  Amlodipine  coats her tongue and causes her esophgaus to feel raw. Given her multiple intolerances. Will increase atenolol to twice daily as tolerated.   Lab Results  Component Value Date   NA 139 12/18/2013   K 3.9 12/18/2013   CL 104 12/18/2013   CO2 30 12/18/2013   Lab Results  Component Value Date   CREATININE 0.8 12/18/2013     Type II or unspecified type diabetes mellitus without mention of complication, not stated as uncontrolled  Historically well-controlled on glipizide. Patient is up-to-date on eye exams and foot exam is normal today. Patient has no  Microalbuminuria by January testing .  Patient is tolerating statin therapy for CAD risk reduction but but has not tolerated  ACE/ARB due to cough and hypotension. She returned too early to repeat her A1c . Will repeat prior to next visit in 3 months.  no changes today.  Lab Results  Component Value Date   HGBA1C 6.5 09/22/2013   Lab Results  Component Value Date   MICROALBUR 1.1 12/18/2013        Other and unspecified hyperlipidemia Tolerating every other day Crestor for elevated risk for coronary artery disease and stroke due to concurrent diabetes.  Lab Results  Component Value Date   CHOL 159 12/18/2013   HDL 64.20 12/18/2013   LDLCALC 79 12/18/2013   LDLDIRECT 141.7 03/08/2013   TRIG 79.0 12/18/2013   CHOLHDL 2 12/18/2013   Lab Results  Component Value Date   ALT 21 12/18/2013   AST 26 12/18/2013   ALKPHOS 51 12/18/2013   BILITOT 0.6 12/18/2013      Updated Medication List Outpatient Encounter Prescriptions  as of 12/18/2013  Medication Sig  . aspirin 81 MG tablet Take 81 mg by mouth daily.  Marland Kitchen atenolol (TENORMIN) 25 MG tablet Take 1 tablet (25 mg total) by mouth 2 (two) times daily.  Marland Kitchen glipiZIDE (GLUCOTROL) 5 MG tablet Take 1 tablet (5 mg total) by mouth 2 (two) times daily before a meal.  . glycerin adult (GLYCERIN ADULT) 2 G SUPP Place 1 suppository rectally once as needed for moderate constipation.  . hydrochlorothiazide (HYDRODIURIL) 25 MG tablet Take 25 mg by mouth daily.  . isosorbide mononitrate (IMDUR) 60 MG 24 hr tablet Take 60 mg by mouth daily.  . meclizine (ANTIVERT) 25 MG tablet Take 1 tablet (25 mg total) by mouth 3 (three) times daily as  needed.  . nitroGLYCERIN (NITROSTAT) 0.4 MG SL tablet Place 0.4 mg under the tongue every 5 (five) minutes as needed for chest pain.  Marland Kitchen omeprazole (PRILOSEC) 20 MG capsule Take 20 mg by mouth daily.  . psyllium (METAMUCIL) 58.6 % powder Take 1 packet by mouth daily.  . rosuvastatin (CRESTOR) 10 MG tablet Take 1 tablet (10 mg total) by mouth every other day.  . warfarin (COUMADIN) 2 MG tablet Take 2 mg by mouth daily.  . [DISCONTINUED] atenolol (TENORMIN) 25 MG tablet Take 25 mg by mouth daily.  . [DISCONTINUED] amLODipine (NORVASC) 10 MG tablet Take 1 tablet (10 mg total) by mouth daily.     Orders Placed This Encounter  Procedures  . MM DIGITAL SCREENING BILATERAL  . Lipid panel  . Microalbumin / creatinine urine ratio  . Comprehensive metabolic panel  . HM DIABETES FOOT EXAM    Return in about 3 months (around 03/20/2014) for follow up diabetes.

## 2013-12-18 NOTE — Assessment & Plan Note (Addendum)
Did not tolerate losartan,  Made her feel sick,  Amlodipine  coats her tongue and causes her esophgaus to feel raw. Given her multiple intolerances. Will increase atenolol to twice daily as tolerated.   Lab Results  Component Value Date   NA 139 12/18/2013   K 3.9 12/18/2013   CL 104 12/18/2013   CO2 30 12/18/2013   Lab Results  Component Value Date   CREATININE 0.8 12/18/2013

## 2013-12-18 NOTE — Patient Instructions (Signed)
You are doing well  Please stop the amlodipine  You may use the atenolol 25 mg as needed (extra dose) for bp > 150/90  Return in 3 months for you annual exam

## 2013-12-18 NOTE — Progress Notes (Signed)
Pre-visit discussion using our clinic review tool. No additional management support is needed unless otherwise documented below in the visit note.  

## 2013-12-19 ENCOUNTER — Encounter: Payer: Self-pay | Admitting: *Deleted

## 2013-12-19 ENCOUNTER — Encounter: Payer: Self-pay | Admitting: Internal Medicine

## 2013-12-19 ENCOUNTER — Telehealth: Payer: Self-pay | Admitting: Internal Medicine

## 2013-12-19 MED ORDER — ATENOLOL 25 MG PO TABS: 25.0000 mg | ORAL_TABLET | Freq: Two times a day (BID) | ORAL | Status: DC

## 2013-12-19 NOTE — Assessment & Plan Note (Signed)
Tolerating every other day Crestor for elevated risk for coronary artery disease and stroke due to concurrent diabetes.  Lab Results  Component Value Date   CHOL 159 12/18/2013   HDL 64.20 12/18/2013   LDLCALC 79 12/18/2013   LDLDIRECT 141.7 03/08/2013   TRIG 79.0 12/18/2013   CHOLHDL 2 12/18/2013   Lab Results  Component Value Date   ALT 21 12/18/2013   AST 26 12/18/2013   ALKPHOS 51 12/18/2013   BILITOT 0.6 12/18/2013

## 2013-12-19 NOTE — Telephone Encounter (Signed)
Relevant patient education mailed to patient.  

## 2013-12-19 NOTE — Assessment & Plan Note (Addendum)
Historically well-controlled on glipizide. Patient is up-to-date on eye exams and foot exam is normal today. Patient has no  Microalbuminuria by January testing .  Patient is tolerating statin therapy for CAD risk reduction but but has not tolerated  ACE/ARB due to cough and hypotension. She returned too early to repeat her A1c . Will repeat prior to next visit in 3 months.  no changes today.  Lab Results  Component Value Date   HGBA1C 6.5 09/22/2013   Lab Results  Component Value Date   MICROALBUR 1.1 12/18/2013

## 2014-01-02 ENCOUNTER — Other Ambulatory Visit: Payer: Self-pay | Admitting: *Deleted

## 2014-01-02 MED ORDER — GLIPIZIDE 5 MG PO TABS: 5.0000 mg | ORAL_TABLET | Freq: Two times a day (BID) | ORAL | Status: DC

## 2014-01-15 DIAGNOSIS — I4891 Unspecified atrial fibrillation: Secondary | ICD-10-CM | POA: Diagnosis not present

## 2014-02-05 ENCOUNTER — Encounter: Payer: Self-pay | Admitting: Internal Medicine

## 2014-02-05 ENCOUNTER — Ambulatory Visit (INDEPENDENT_AMBULATORY_CARE_PROVIDER_SITE_OTHER): Payer: Medicare Other | Admitting: Internal Medicine

## 2014-02-05 VITALS — BP 148/88 | HR 61 | Temp 97.9°F | Resp 16 | Ht 66.0 in | Wt 147.0 lb

## 2014-02-05 DIAGNOSIS — B3781 Candidal esophagitis: Secondary | ICD-10-CM

## 2014-02-05 DIAGNOSIS — E785 Hyperlipidemia, unspecified: Secondary | ICD-10-CM

## 2014-02-05 DIAGNOSIS — I2510 Atherosclerotic heart disease of native coronary artery without angina pectoris: Secondary | ICD-10-CM

## 2014-02-05 DIAGNOSIS — I10 Essential (primary) hypertension: Secondary | ICD-10-CM

## 2014-02-05 DIAGNOSIS — R61 Generalized hyperhidrosis: Secondary | ICD-10-CM | POA: Diagnosis not present

## 2014-02-05 DIAGNOSIS — E559 Vitamin D deficiency, unspecified: Secondary | ICD-10-CM | POA: Diagnosis not present

## 2014-02-05 DIAGNOSIS — B370 Candidal stomatitis: Secondary | ICD-10-CM

## 2014-02-05 DIAGNOSIS — E119 Type 2 diabetes mellitus without complications: Secondary | ICD-10-CM | POA: Diagnosis not present

## 2014-02-05 MED ORDER — NYSTATIN 100000 UNIT/ML MT SUSP: 500000.0000 [IU] | Freq: Four times a day (QID) | OROMUCOSAL | Status: DC

## 2014-02-05 MED ORDER — AMLODIPINE BESYLATE 5 MG PO TABS: 5.0000 mg | ORAL_TABLET | Freq: Every day | ORAL | Status: DC

## 2014-02-05 NOTE — Patient Instructions (Addendum)
Do not increase your atenolol .  Keep it 25 mg twice daily  Resume amlodipine dose at night but start with 1/2 tablet  (5 mg at bedtime)  Return for fasting labs when you can   CXR  To evaluate your night sweats   Your skin looks fine.  The itching may be due  To dry skin   For your sore throat, i am treating you for Thrush.  Swish the medication around in your mouth and then swallow it  4 times daily for a week

## 2014-02-05 NOTE — Progress Notes (Signed)
Pre-visit discussion using our clinic review tool. No additional management support is needed unless otherwise documented below in the visit note.  

## 2014-02-05 NOTE — Progress Notes (Signed)
Patient ID: Leah Holmes, female   DOB: 05-24-1927, 80 y.o.   MRN: [id number]    .   Patient Active Problem List   Diagnosis Date Noted  . Thrush of mouth and esophagus 02/07/2014  . Unexplained night sweats 02/07/2014  . Rectal bleeding 09/20/2013  . Dysphagia, unspecified(787.20) 09/20/2013  . Anal polyp 09/20/2013  . Personal history of colonic polyps 08/31/2013  . CAD (coronary artery disease) 03/09/2013  . Type II or unspecified type diabetes mellitus without mention of complication, not stated as uncontrolled 03/09/2013  . Other and unspecified hyperlipidemia 03/09/2013  . Essential hypertension, benign 03/09/2013  . Nonulcer dyspepsia 03/09/2013    Subjective:  CC:   Chief Complaint  Patient presents with  . Acute Visit    throat and tongue coated. Night sweat on back and chest, wakes up to gown soaked    HPI:   Leah Holmes is a 80 y.o. female who presents for follow up on multiple chronic conditions  She has Multiple complaints today .  Accompanied by daughter.   1) Tongue and throat soreness ,  Wakes up with white coating on tongue not easily removed with brushing,.  No recent prednisone use and Bs have been under control.   2) night sweats  Have been occurring for the past month.     3) bp has been uncontrolled at times, so she has doubled her dose of atenolol on days when it is elevated   Past Medical History  Diagnosis Date  . Diabetes mellitus without complication   . Ulcer   . Hyperlipidemia   . Hypertension   . Polyp of colon   . Atrial fibrillation 2012  . GERD (gastroesophageal reflux disease)   . Irritable bowel syndrome   . Colon polyp 2003    Past Surgical History  Procedure Laterality Date  . Appendectomy    . Breast surgery    . Abdominal hysterectomy    . Cholecystectomy  03-2012  . Colonoscopy  2003    Dr Theodosia Paling in [place]  . Upper gi endoscopy  2003    Dr Theodosia Paling in [place]  . Fibroid tumor    . Abdominal adhesion surgery          The following portions of the patient's history were reviewed and updated as appropriate: Allergies, current medications, and problem list.    Review of Systems:   Patient denies headache, fevers, malaise, unintentional weight loss, skin rash, eye pain, sinus congestion and sinus pain, sore throat, dysphagia,  hemoptysis , cough, dyspnea, wheezing, chest pain, palpitations, orthopnea, edema, abdominal pain, nausea, melena, diarrhea, constipation, flank pain, dysuria, hematuria, urinary  Frequency, nocturia, numbness, tingling, seizures,  Focal weakness, Loss of consciousness,  Tremor, insomnia, depression, anxiety, and suicidal ideation.     History   Social History  . Marital Status: Widowed    Spouse Name: N/A    Number of Children: N/A  . Years of Education: N/A   Occupational History  . Not on file.   Social History Main Topics  . Smoking status: Never Smoker   . Smokeless tobacco: Current User    Types: Snuff     Comment: occasionally  . Alcohol Use: No  . Drug Use: No  . Sexual Activity: No   Other Topics Concern  . Not on file   Social History Narrative  . No narrative on file    Objective:  Filed Vitals:   02/05/14 1756  BP: 148/88  Pulse: 61  Temp: 97.9  F (36.6 C)  Resp: 16     General appearance: alert, cooperative and appears stated age Ears: normal TM's and external ear canals both ears Throat: lips, mucosa, and tongue normal; teeth and gums normal Neck: no adenopathy, no carotid bruit, supple, symmetrical, trachea midline and thyroid not enlarged, symmetric, no tenderness/mass/nodules Back: symmetric, no curvature. ROM normal. No CVA tenderness. Lungs: clear to auscultation bilaterally Heart: regular rate and rhythm, S1, S2 normal, no murmur, click, rub or gallop Abdomen: soft, non-tender; bowel sounds normal; no masses,  no organomegaly Pulses: 2+ and symmetric Skin: Skin color, texture, turgor normal. No rashes or lesions Lymph  nodes: Cervical, supraclavicular, and axillary nodes normal.  Assessment and Plan:  Thrush of mouth and esophagus trial of nystatin suspension x 1 week   Unexplained night sweats Checking chest x ray and CBC   Essential hypertension, benign Cautioned not to double atenolol due to low resting pulse,  Adding back amlodipine at 5 mg daily    Updated Medication List Outpatient Encounter Prescriptions as of 02/05/2014  Medication Sig  . aspirin 81 MG tablet Take 81 mg by mouth daily.  Marland Kitchen atenolol (TENORMIN) 25 MG tablet Take 1 tablet (25 mg total) by mouth 2 (two) times daily.  Marland Kitchen glipiZIDE (GLUCOTROL) 5 MG tablet Take 1 tablet (5 mg total) by mouth 2 (two) times daily before a meal.  . glycerin adult (GLYCERIN ADULT) 2 G SUPP Place 1 suppository rectally once as needed for moderate constipation.  . hydrochlorothiazide (HYDRODIURIL) 25 MG tablet Take 25 mg by mouth daily. Takes 25 mg every other day  . isosorbide mononitrate (IMDUR) 60 MG 24 hr tablet Take 60 mg by mouth 2 (two) times daily.   . meclizine (ANTIVERT) 25 MG tablet Take 1 tablet (25 mg total) by mouth 3 (three) times daily as needed.  . nitroGLYCERIN (NITROSTAT) 0.4 MG SL tablet Place 0.4 mg under the tongue every 5 (five) minutes as needed for chest pain.  Marland Kitchen omeprazole (PRILOSEC) 20 MG capsule Take 20 mg by mouth daily.  . psyllium (METAMUCIL) 58.6 % powder Take 1 packet by mouth daily.  . rosuvastatin (CRESTOR) 10 MG tablet Take 1 tablet (10 mg total) by mouth every other day.  . warfarin (COUMADIN) 2 MG tablet Take 2 mg by mouth daily.  Marland Kitchen amLODipine (NORVASC) 5 MG tablet Take 1 tablet (5 mg total) by mouth daily.  Marland Kitchen nystatin (MYCOSTATIN) 100000 UNIT/ML suspension Take 5 mLs (500,000 Units total) by mouth 4 (four) times daily.  . [DISCONTINUED] amLODipine (NORVASC) 5 MG tablet Take 1 tablet (5 mg total) by mouth daily.     Orders Placed This Encounter  Procedures  . DG Chest 2 View  . CBC with Differential  .  Comprehensive metabolic panel  . Hemoglobin A1c  . Lipid panel  . TSH  . Vit D  25 hydroxy (rtn osteoporosis monitoring)    Return in about 4 weeks (around 03/05/2014).

## 2014-02-06 MED ORDER — AMLODIPINE BESYLATE 5 MG PO TABS: 5.0000 mg | ORAL_TABLET | Freq: Every day | ORAL | Status: DC

## 2014-02-07 ENCOUNTER — Encounter: Payer: Self-pay | Admitting: Internal Medicine

## 2014-02-07 DIAGNOSIS — R61 Generalized hyperhidrosis: Secondary | ICD-10-CM | POA: Insufficient documentation

## 2014-02-07 DIAGNOSIS — B370 Candidal stomatitis: Secondary | ICD-10-CM | POA: Insufficient documentation

## 2014-02-07 DIAGNOSIS — B3781 Candidal esophagitis: Secondary | ICD-10-CM

## 2014-02-07 NOTE — Assessment & Plan Note (Signed)
trial of nystatin suspension x 1 week

## 2014-02-07 NOTE — Assessment & Plan Note (Signed)
Checking chest x ray and CBC

## 2014-02-07 NOTE — Assessment & Plan Note (Signed)
Cautioned not to double atenolol due to low resting pulse,  Adding back amlodipine at 5 mg daily

## 2014-02-13 DIAGNOSIS — I4891 Unspecified atrial fibrillation: Secondary | ICD-10-CM | POA: Diagnosis not present

## 2014-02-15 ENCOUNTER — Other Ambulatory Visit (INDEPENDENT_AMBULATORY_CARE_PROVIDER_SITE_OTHER): Payer: Medicare Other

## 2014-02-15 DIAGNOSIS — E119 Type 2 diabetes mellitus without complications: Secondary | ICD-10-CM | POA: Diagnosis not present

## 2014-02-15 DIAGNOSIS — E785 Hyperlipidemia, unspecified: Secondary | ICD-10-CM | POA: Diagnosis not present

## 2014-02-15 DIAGNOSIS — E559 Vitamin D deficiency, unspecified: Secondary | ICD-10-CM

## 2014-02-15 DIAGNOSIS — R7989 Other specified abnormal findings of blood chemistry: Secondary | ICD-10-CM

## 2014-02-15 DIAGNOSIS — R61 Generalized hyperhidrosis: Secondary | ICD-10-CM | POA: Diagnosis not present

## 2014-02-15 LAB — CBC WITH DIFFERENTIAL/PLATELET
BASOS ABS: 0 10*3/uL (ref 0.0–0.1)
Basophils Relative: 0.3 % (ref 0.0–3.0)
EOS ABS: 0.2 10*3/uL (ref 0.0–0.7)
Eosinophils Relative: 3.4 % (ref 0.0–5.0)
HCT: 43.9 % (ref 36.0–46.0)
Hemoglobin: 14.8 g/dL (ref 12.0–15.0)
Lymphocytes Relative: 21.7 % (ref 12.0–46.0)
Lymphs Abs: 1.1 10*3/uL (ref 0.7–4.0)
MCHC: 33.7 g/dL (ref 30.0–36.0)
MCV: 94.2 fl (ref 78.0–100.0)
MONO ABS: 0.5 10*3/uL (ref 0.1–1.0)
Monocytes Relative: 10.7 % (ref 3.0–12.0)
Neutro Abs: 3.2 10*3/uL (ref 1.4–7.7)
Neutrophils Relative %: 63.9 % (ref 43.0–77.0)
PLATELETS: 218 10*3/uL (ref 150.0–400.0)
RBC: 4.66 Mil/uL (ref 3.87–5.11)
RDW: 13.2 % (ref 11.5–15.5)
WBC: 5.1 10*3/uL (ref 4.0–10.5)

## 2014-02-15 LAB — LIPID PANEL
CHOLESTEROL: 145 mg/dL (ref 0–200)
HDL: 57.3 mg/dL (ref >39.00)
LDL CALC: 76 mg/dL (ref 0–99)
NonHDL: 87.7
Total CHOL/HDL Ratio: 3
Triglycerides: 57 mg/dL (ref 0.0–149.0)
VLDL: 11.4 mg/dL (ref 0.0–40.0)

## 2014-02-15 LAB — COMPREHENSIVE METABOLIC PANEL
ALBUMIN: 3.5 g/dL (ref 3.5–5.2)
ALK PHOS: 56 U/L (ref 39–117)
ALT: 19 U/L (ref 0–35)
AST: 23 U/L (ref 0–37)
BUN: 10 mg/dL (ref 6–23)
CO2: 34 mEq/L — ABNORMAL HIGH (ref 19–32)
Calcium: 9.4 mg/dL (ref 8.4–10.5)
Chloride: 99 mEq/L (ref 96–112)
Creatinine, Ser: 0.7 mg/dL (ref 0.4–1.2)
GFR: 79.19 mL/min (ref >60.00)
Glucose, Bld: 116 mg/dL — ABNORMAL HIGH (ref 70–99)
POTASSIUM: 4.2 meq/L (ref 3.5–5.1)
Sodium: 137 mEq/L (ref 135–145)
Total Bilirubin: 1.3 mg/dL — ABNORMAL HIGH (ref 0.2–1.2)
Total Protein: 6.7 g/dL (ref 6.0–8.3)

## 2014-02-15 LAB — TSH: TSH: 0.62 u[IU]/mL (ref 0.35–4.50)

## 2014-02-15 LAB — HEMOGLOBIN A1C: Hgb A1c MFr Bld: 6.3 % (ref 4.6–6.5)

## 2014-02-15 LAB — VITAMIN D 25 HYDROXY (VIT D DEFICIENCY, FRACTURES): VITD: 20.19 ng/mL — ABNORMAL LOW (ref 30.00–100.00)

## 2014-02-16 DIAGNOSIS — R7989 Other specified abnormal findings of blood chemistry: Secondary | ICD-10-CM | POA: Insufficient documentation

## 2014-02-16 MED ORDER — ERGOCALCIFEROL 1.25 MG (50000 UT) PO CAPS: 50000.0000 [IU] | ORAL_CAPSULE | ORAL | Status: DC

## 2014-02-16 NOTE — Addendum Note (Signed)
Addended by: Crecencio Mc on: 02/16/2014 11:47 AM   Modules accepted: Orders

## 2014-02-21 ENCOUNTER — Other Ambulatory Visit: Payer: Self-pay | Admitting: *Deleted

## 2014-02-21 NOTE — Telephone Encounter (Signed)
Pt's daughter states thrush has imroved, but not completely resolved. Requesting refill on nystatin suspension. OK?

## 2014-02-22 MED ORDER — NYSTATIN 100000 UNIT/ML MT SUSP: 500000.0000 [IU] | Freq: Four times a day (QID) | OROMUCOSAL | Status: DC

## 2014-02-22 NOTE — Telephone Encounter (Signed)
Ok to refill,  Refill sent  

## 2014-02-25 ENCOUNTER — Ambulatory Visit: Payer: Self-pay | Admitting: General Surgery

## 2014-02-25 ENCOUNTER — Emergency Department: Payer: Self-pay | Admitting: Emergency Medicine

## 2014-02-25 DIAGNOSIS — E119 Type 2 diabetes mellitus without complications: Secondary | ICD-10-CM | POA: Diagnosis not present

## 2014-02-25 DIAGNOSIS — I2510 Atherosclerotic heart disease of native coronary artery without angina pectoris: Secondary | ICD-10-CM | POA: Diagnosis not present

## 2014-02-25 DIAGNOSIS — I4891 Unspecified atrial fibrillation: Secondary | ICD-10-CM | POA: Diagnosis not present

## 2014-02-25 DIAGNOSIS — J438 Other emphysema: Secondary | ICD-10-CM | POA: Diagnosis not present

## 2014-02-25 DIAGNOSIS — Z880 Allergy status to penicillin: Secondary | ICD-10-CM | POA: Diagnosis not present

## 2014-02-25 DIAGNOSIS — R0789 Other chest pain: Secondary | ICD-10-CM | POA: Diagnosis not present

## 2014-02-25 DIAGNOSIS — I10 Essential (primary) hypertension: Secondary | ICD-10-CM | POA: Diagnosis not present

## 2014-02-25 DIAGNOSIS — R079 Chest pain, unspecified: Secondary | ICD-10-CM | POA: Diagnosis not present

## 2014-02-25 DIAGNOSIS — I509 Heart failure, unspecified: Secondary | ICD-10-CM | POA: Diagnosis not present

## 2014-02-25 DIAGNOSIS — Z79899 Other long term (current) drug therapy: Secondary | ICD-10-CM | POA: Diagnosis not present

## 2014-02-25 DIAGNOSIS — Z7901 Long term (current) use of anticoagulants: Secondary | ICD-10-CM | POA: Diagnosis not present

## 2014-02-25 LAB — COMPREHENSIVE METABOLIC PANEL
Albumin: 3.7 g/dL (ref 3.4–5.0)
Alkaline Phosphatase: 69 U/L
Anion Gap: 7 (ref 7–16)
BUN: 12 mg/dL (ref 7–18)
Bilirubin,Total: 0.5 mg/dL (ref 0.2–1.0)
Calcium, Total: 9.1 mg/dL (ref 8.5–10.1)
Chloride: 100 mmol/L (ref 98–107)
Co2: 28 mmol/L (ref 21–32)
Creatinine: 0.88 mg/dL (ref 0.60–1.30)
EGFR (African American): >60
GFR CALC NON AF AMER: 60 — AB
GLUCOSE: 190 mg/dL — AB (ref 65–99)
OSMOLALITY: 275 (ref 275–301)
Potassium: 3.8 mmol/L (ref 3.5–5.1)
SGOT(AST): 21 U/L (ref 15–37)
SGPT (ALT): 27 U/L
Sodium: 135 mmol/L — ABNORMAL LOW (ref 136–145)
Total Protein: 7.1 g/dL (ref 6.4–8.2)

## 2014-02-25 LAB — CBC
HCT: 45.3 % (ref 35.0–47.0)
HGB: 15.3 g/dL (ref 12.0–16.0)
MCH: 31.5 pg (ref 26.0–34.0)
MCHC: 33.8 g/dL (ref 32.0–36.0)
MCV: 93 fL (ref 80–100)
PLATELETS: 217 10*3/uL (ref 150–440)
RBC: 4.85 10*6/uL (ref 3.80–5.20)
RDW: 13.2 % (ref 11.5–14.5)
WBC: 5.5 10*3/uL (ref 3.6–11.0)

## 2014-02-25 LAB — TROPONIN I: Troponin-I: <0.02 ng/mL

## 2014-02-25 LAB — PROTIME-INR
INR: 2
Prothrombin Time: 22.3 secs — ABNORMAL HIGH (ref 11.5–14.7)

## 2014-02-25 LAB — PRO B NATRIURETIC PEPTIDE: B-Type Natriuretic Peptide: 1334 pg/mL — ABNORMAL HIGH (ref 0–450)

## 2014-03-07 ENCOUNTER — Encounter: Payer: Self-pay | Admitting: Internal Medicine

## 2014-03-07 ENCOUNTER — Ambulatory Visit (INDEPENDENT_AMBULATORY_CARE_PROVIDER_SITE_OTHER): Payer: Medicare Other | Admitting: Internal Medicine

## 2014-03-07 VITALS — BP 138/62 | HR 51 | Temp 97.6°F | Resp 18 | Ht 66.0 in | Wt 144.8 lb

## 2014-03-07 DIAGNOSIS — Z23 Encounter for immunization: Secondary | ICD-10-CM | POA: Diagnosis not present

## 2014-03-07 DIAGNOSIS — I10 Essential (primary) hypertension: Secondary | ICD-10-CM | POA: Diagnosis not present

## 2014-03-07 DIAGNOSIS — K117 Disturbances of salivary secretion: Secondary | ICD-10-CM

## 2014-03-07 DIAGNOSIS — I2510 Atherosclerotic heart disease of native coronary artery without angina pectoris: Secondary | ICD-10-CM

## 2014-03-07 DIAGNOSIS — R682 Dry mouth, unspecified: Secondary | ICD-10-CM

## 2014-03-07 MED ORDER — LISINOPRIL 5 MG PO TABS: 5.0000 mg | ORAL_TABLET | Freq: Every day | ORAL | Status: DC

## 2014-03-07 NOTE — Patient Instructions (Signed)
We are adding    lisinopril   5 mg in the morning   We are stopping the morning dose of imdur ,  continue the afternoon dose   If you feel better with imdur once daily , continue once daily imdur    Continue to swish and rinse your mjuth every morning and use the biotene    If these changes don't help you feel better, let me know

## 2014-03-07 NOTE — Progress Notes (Signed)
Patient ID: Leah Holmes, female   DOB: 06-12-1927, 80 y.o.   MRN: [id number]   Patient Active Problem List   Diagnosis Date Noted  . Dry mouth 03/10/2014  . Low serum vitamin D 02/16/2014  . Thrush of mouth and esophagus 02/07/2014  . Unexplained night sweats 02/07/2014  . Rectal bleeding 09/20/2013  . Dysphagia, unspecified(787.20) 09/20/2013  . Anal polyp 09/20/2013  . Personal history of colonic polyps 08/31/2013  . CAD (coronary artery disease) 03/09/2013  . Type II or unspecified type diabetes mellitus without mention of complication, not stated as uncontrolled 03/09/2013  . Other and unspecified hyperlipidemia 03/09/2013  . Essential hypertension, benign 03/09/2013  . Nonulcer dyspepsia 03/09/2013    Subjective:  CC:   Chief Complaint  Patient presents with  . Follow-up    night sweats  . Hypertension    was seen in ER for elevated BP 02/25/14    HPI:   Leah Holmes is a 80 y.o. female who presents for Follow up on recent ER visit on Sept 13th for progressively elevated BP  accompanied by increased tremor and "feeling bad."    ER recrods were reviewed with patienr and daughter today a Chest x ray done.   Hyperinflation was noted.   BNP 1300  Troponin 0.02 , CBC and BMET  Were normal   Has been taking amlodipine only prn bc it cause constipation and a white coating on her tongue.  At her last visit she was treated for nystatin due to complaints of esophagitis presume to be thrush given white coating on tongue.  However patient has no history of DM or steroid use.  CBC was normal last week..     Past Medical History  Diagnosis Date  . Diabetes mellitus without complication   . Ulcer   . Hyperlipidemia   . Hypertension   . Polyp of colon   . Atrial fibrillation 2012  . GERD (gastroesophageal reflux disease)   . Irritable bowel syndrome   . Colon polyp 2003    Past Surgical History  Procedure Laterality Date  . Appendectomy    . Breast surgery    .  Abdominal hysterectomy    . Cholecystectomy  03-2012  . Colonoscopy  2003    Dr Theodosia Paling in [place]  . Upper gi endoscopy  2003    Dr Theodosia Paling in [place]  . Fibroid tumor    . Abdominal adhesion surgery         The following portions of the patient's history were reviewed and updated as appropriate: Allergies, current medications, and problem list.    Review of Systems:   Patient denies headache, fevers, malaise, unintentional weight loss, skin rash, eye pain, sinus congestion and sinus pain, sore throat, dysphagia,  hemoptysis , cough, dyspnea, wheezing, chest pain, palpitations, orthopnea, edema, abdominal pain, nausea, melena, diarrhea, constipation, flank pain, dysuria, hematuria, urinary  Frequency, nocturia, numbness, tingling, seizures,  Focal weakness, Loss of consciousness,  Tremor, insomnia, depression, anxiety, and suicidal ideation.     History   Social History  . Marital Status: Widowed    Spouse Name: N/A    Number of Children: N/A  . Years of Education: N/A   Occupational History  . Not on file.   Social History Main Topics  . Smoking status: Never Smoker   . Smokeless tobacco: Current User    Types: Snuff     Comment: occasionally  . Alcohol Use: No  . Drug Use: No  . Sexual Activity: No  Other Topics Concern  . Not on file   Social History Narrative  . No narrative on file    Objective:  Filed Vitals:   03/07/14 1414  BP: 138/62  Pulse: 51  Temp: 97.6 F (36.4 C)  Resp: 18     General appearance: alert, cooperative and appears stated age Ears: normal TM's and external ear canals both ears Throat: lips, mucosa, and tongue normal; teeth and gums normal Neck: no adenopathy, no carotid bruit, supple, symmetrical, trachea midline and thyroid not enlarged, symmetric, no tenderness/mass/nodules Back: symmetric, no curvature. ROM normal. No CVA tenderness. Lungs: clear to auscultation bilaterally Heart: regular rate and rhythm, S1, S2 normal,  no murmur, click, rub or gallop Abdomen: soft, non-tender; bowel sounds normal; no masses,  no organomegaly Pulses: 2+ and symmetric Skin: Skin color, texture, turgor normal. No rashes or lesions Lymph nodes: Cervical, supraclavicular, and axillary nodes normal.  Assessment and Plan:  Dry mouth Explained that her white coating and sore throat is not thrush but caused by snoring and dry mouth  Use biotene, rinse mouth often .  Essential hypertension, benign She has multiple drug intolerances making choice limited.  She is willing to retry lisinopril since the cough was mild. Resuming daily lisiinopril.  reducing imdur to once daily   A total of 25 minutes of face to face time was spent with patient more than half of which was spent in counselling and coordination of care    Updated Medication List Outpatient Encounter Prescriptions as of 03/07/2014  Medication Sig  . amLODipine (NORVASC) 5 MG tablet Take 1 tablet (5 mg total) by mouth daily.  Marland Kitchen aspirin 81 MG tablet Take 81 mg by mouth daily.  Marland Kitchen atenolol (TENORMIN) 25 MG tablet Take 1 tablet (25 mg total) by mouth 2 (two) times daily.  . ergocalciferol (DRISDOL) 50000 UNITS capsule Take 1 capsule (50,000 Units total) by mouth once a week.  Marland Kitchen glipiZIDE (GLUCOTROL) 5 MG tablet Take 1 tablet (5 mg total) by mouth 2 (two) times daily before a meal.  . glycerin adult (GLYCERIN ADULT) 2 G SUPP Place 1 suppository rectally once as needed for moderate constipation.  . hydrochlorothiazide (HYDRODIURIL) 25 MG tablet Take 25 mg by mouth daily. Takes 25 mg every other day  . isosorbide mononitrate (IMDUR) 60 MG 24 hr tablet Take 60 mg by mouth 2 (two) times daily.   . meclizine (ANTIVERT) 25 MG tablet Take 1 tablet (25 mg total) by mouth 3 (three) times daily as needed.  . nitroGLYCERIN (NITROSTAT) 0.4 MG SL tablet Place 0.4 mg under the tongue every 5 (five) minutes as needed for chest pain.  Marland Kitchen nystatin (MYCOSTATIN) 100000 UNIT/ML suspension Take 5  mLs (500,000 Units total) by mouth 4 (four) times daily.  Marland Kitchen omeprazole (PRILOSEC) 20 MG capsule Take 20 mg by mouth daily.  . psyllium (METAMUCIL) 58.6 % powder Take 1 packet by mouth daily.  Marland Kitchen warfarin (COUMADIN) 2 MG tablet Take 2 mg by mouth daily.  . [DISCONTINUED] rosuvastatin (CRESTOR) 10 MG tablet Take 1 tablet (10 mg total) by mouth every other day.  . lisinopril (PRINIVIL,ZESTRIL) 5 MG tablet Take 1 tablet (5 mg total) by mouth daily.     No orders of the defined types were placed in this encounter.    Return in about 3 months (around 06/06/2014).

## 2014-03-08 ENCOUNTER — Telehealth: Payer: Self-pay | Admitting: Internal Medicine

## 2014-03-08 DIAGNOSIS — E785 Hyperlipidemia, unspecified: Secondary | ICD-10-CM

## 2014-03-08 MED ORDER — ROSUVASTATIN CALCIUM 10 MG PO TABS: 10.0000 mg | ORAL_TABLET | ORAL | Status: DC

## 2014-03-08 NOTE — Telephone Encounter (Signed)
Patient called for refill on crestor  Refill sent to [facility].

## 2014-03-10 ENCOUNTER — Encounter: Payer: Self-pay | Admitting: Internal Medicine

## 2014-03-10 DIAGNOSIS — R682 Dry mouth, unspecified: Secondary | ICD-10-CM | POA: Insufficient documentation

## 2014-03-10 NOTE — Assessment & Plan Note (Addendum)
She has multiple drug intolerances making choice limited.  She is willing to retry lisinopril since the cough was mild. Resuming daily lisiinopril.  reducing imdur to once daily

## 2014-03-10 NOTE — Assessment & Plan Note (Signed)
Explained that her white coating and sore throat is not thrush but caused by snoring and dry mouth  Use biotene, rinse mouth often .

## 2014-03-15 DIAGNOSIS — I4891 Unspecified atrial fibrillation: Secondary | ICD-10-CM | POA: Diagnosis not present

## 2014-03-19 ENCOUNTER — Telehealth: Payer: Self-pay | Admitting: *Deleted

## 2014-03-19 ENCOUNTER — Other Ambulatory Visit: Payer: Self-pay | Admitting: *Deleted

## 2014-03-19 DIAGNOSIS — E7800 Pure hypercholesterolemia, unspecified: Secondary | ICD-10-CM

## 2014-03-19 MED ORDER — ROSUVASTATIN CALCIUM 10 MG PO TABS: 10.0000 mg | ORAL_TABLET | ORAL | Status: DC

## 2014-03-19 NOTE — Telephone Encounter (Signed)
Fax from [facility], needing PA on Crestor. Started online, pending response

## 2014-03-19 NOTE — Telephone Encounter (Signed)
Fax from [facility], Crestor is on patient's plan's list of covered drugs. Faxed to pharmacy.

## 2014-03-22 ENCOUNTER — Encounter: Payer: Medicare Other | Admitting: Internal Medicine

## 2014-03-28 DIAGNOSIS — I482 Chronic atrial fibrillation: Secondary | ICD-10-CM | POA: Diagnosis not present

## 2014-03-28 DIAGNOSIS — I25119 Atherosclerotic heart disease of native coronary artery with unspecified angina pectoris: Secondary | ICD-10-CM | POA: Diagnosis not present

## 2014-03-28 DIAGNOSIS — I10 Essential (primary) hypertension: Secondary | ICD-10-CM | POA: Diagnosis not present

## 2014-03-28 DIAGNOSIS — R079 Chest pain, unspecified: Secondary | ICD-10-CM | POA: Diagnosis not present

## 2014-03-28 DIAGNOSIS — I517 Cardiomegaly: Secondary | ICD-10-CM | POA: Diagnosis not present

## 2014-04-04 DIAGNOSIS — I482 Chronic atrial fibrillation: Secondary | ICD-10-CM | POA: Diagnosis not present

## 2014-04-11 DIAGNOSIS — I4891 Unspecified atrial fibrillation: Secondary | ICD-10-CM | POA: Diagnosis not present

## 2014-04-16 ENCOUNTER — Encounter: Payer: Self-pay | Admitting: Internal Medicine

## 2014-04-18 ENCOUNTER — Telehealth: Payer: Self-pay | Admitting: *Deleted

## 2014-04-18 NOTE — Telephone Encounter (Signed)
No,  It is not does she want to stwitch to pantoprazole

## 2014-04-18 NOTE — Telephone Encounter (Signed)
Patient requesting generic Nexium but there is not a generic for Nexium right?

## 2014-04-18 NOTE — Telephone Encounter (Signed)
Patient daughter notified no generic for Nexium and stated she will call back if patient would like to switch to Pantoprazole.

## 2014-04-23 ENCOUNTER — Telehealth: Payer: Self-pay | Admitting: *Deleted

## 2014-04-23 NOTE — Telephone Encounter (Signed)
Ok refill? 

## 2014-04-24 MED ORDER — MECLIZINE HCL 25 MG PO TABS: 25.0000 mg | ORAL_TABLET | Freq: Three times a day (TID) | ORAL | Status: DC | PRN

## 2014-04-24 NOTE — Telephone Encounter (Signed)
Ok to refill,  Refill sent  

## 2014-04-25 MED ORDER — ONETOUCH DELICA LANCETS 33G MISC: Status: DC

## 2014-04-25 MED ORDER — GLUCOSE BLOOD VI STRP: ORAL_STRIP | Status: DC

## 2014-04-25 NOTE — Addendum Note (Signed)
Addended by: Wynonia Lawman E on: 04/25/2014 04:33 PM   Modules accepted: Orders

## 2014-04-25 NOTE — Telephone Encounter (Addendum)
Needing refill for delica lancets and one touch ultra test strips. Rx sent to pharmacy by escript

## 2014-04-25 NOTE — Telephone Encounter (Signed)
The pt's daughter called and is stating [facility] will not refill the rx for this medication and for the lancets without a diagnosis code.

## 2014-05-08 DIAGNOSIS — R0602 Shortness of breath: Secondary | ICD-10-CM | POA: Diagnosis not present

## 2014-05-08 DIAGNOSIS — I4891 Unspecified atrial fibrillation: Secondary | ICD-10-CM | POA: Diagnosis not present

## 2014-05-08 DIAGNOSIS — I517 Cardiomegaly: Secondary | ICD-10-CM | POA: Diagnosis not present

## 2014-05-08 DIAGNOSIS — I340 Nonrheumatic mitral (valve) insufficiency: Secondary | ICD-10-CM | POA: Insufficient documentation

## 2014-05-08 DIAGNOSIS — I071 Rheumatic tricuspid insufficiency: Secondary | ICD-10-CM | POA: Insufficient documentation

## 2014-05-08 DIAGNOSIS — I272 Pulmonary hypertension, unspecified: Secondary | ICD-10-CM | POA: Insufficient documentation

## 2014-05-08 DIAGNOSIS — I10 Essential (primary) hypertension: Secondary | ICD-10-CM | POA: Diagnosis not present

## 2014-05-08 DIAGNOSIS — E782 Mixed hyperlipidemia: Secondary | ICD-10-CM | POA: Diagnosis not present

## 2014-05-22 ENCOUNTER — Encounter: Payer: Self-pay | Admitting: Internal Medicine

## 2014-05-22 ENCOUNTER — Ambulatory Visit (INDEPENDENT_AMBULATORY_CARE_PROVIDER_SITE_OTHER): Payer: Medicare Other | Admitting: Internal Medicine

## 2014-05-22 ENCOUNTER — Encounter (INDEPENDENT_AMBULATORY_CARE_PROVIDER_SITE_OTHER): Payer: Self-pay

## 2014-05-22 VITALS — BP 156/70 | HR 71 | Temp 97.9°F | Resp 16 | Ht 65.0 in | Wt 146.2 lb

## 2014-05-22 DIAGNOSIS — E1169 Type 2 diabetes mellitus with other specified complication: Secondary | ICD-10-CM

## 2014-05-22 DIAGNOSIS — E119 Type 2 diabetes mellitus without complications: Secondary | ICD-10-CM | POA: Diagnosis not present

## 2014-05-22 DIAGNOSIS — I10 Essential (primary) hypertension: Secondary | ICD-10-CM | POA: Diagnosis not present

## 2014-05-22 DIAGNOSIS — I2510 Atherosclerotic heart disease of native coronary artery without angina pectoris: Secondary | ICD-10-CM | POA: Diagnosis not present

## 2014-05-22 DIAGNOSIS — Z0000 Encounter for general adult medical examination without abnormal findings: Secondary | ICD-10-CM | POA: Diagnosis not present

## 2014-05-22 DIAGNOSIS — R7989 Other specified abnormal findings of blood chemistry: Secondary | ICD-10-CM

## 2014-05-22 DIAGNOSIS — E785 Hyperlipidemia, unspecified: Secondary | ICD-10-CM

## 2014-05-22 LAB — COMPREHENSIVE METABOLIC PANEL
ALT: 17 U/L (ref 0–35)
AST: 22 U/L (ref 0–37)
Albumin: 3.8 g/dL (ref 3.5–5.2)
Alkaline Phosphatase: 56 U/L (ref 39–117)
BILIRUBIN TOTAL: 0.7 mg/dL (ref 0.2–1.2)
BUN: 13 mg/dL (ref 6–23)
CO2: 32 mEq/L (ref 19–32)
CREATININE: 0.8 mg/dL (ref 0.4–1.2)
Calcium: 9.6 mg/dL (ref 8.4–10.5)
Chloride: 99 mEq/L (ref 96–112)
GFR: 76.74 mL/min (ref >60.00)
Glucose, Bld: 66 mg/dL — ABNORMAL LOW (ref 70–99)
Potassium: 4.3 mEq/L (ref 3.5–5.1)
SODIUM: 137 meq/L (ref 135–145)
TOTAL PROTEIN: 6.4 g/dL (ref 6.0–8.3)

## 2014-05-22 LAB — MICROALBUMIN / CREATININE URINE RATIO
Creatinine,U: 90.1 mg/dL
MICROALB/CREAT RATIO: 0.7 mg/g (ref 0.0–30.0)
Microalb, Ur: 0.6 mg/dL (ref 0.0–1.9)

## 2014-05-22 LAB — HEMOGLOBIN A1C: Hgb A1c MFr Bld: 6.3 % (ref 4.6–6.5)

## 2014-05-22 MED ORDER — ATENOLOL 50 MG PO TABS: 50.0000 mg | ORAL_TABLET | Freq: Two times a day (BID) | ORAL | Status: DC

## 2014-05-22 MED ORDER — SIMVASTATIN 10 MG PO TABS: 10.0000 mg | ORAL_TABLET | Freq: Every day | ORAL | Status: DC

## 2014-05-22 NOTE — Patient Instructions (Addendum)
We are increase the atenolol to 50 mg twice daily  (you ca take 2 of your 25 mg tablets two times per  DAY UNTIL YOU RUN OUT  We are changing from Crestor to simvastatin for you cholesterol for your  cholesterol.   We will repeat your fasting lipids in 3 months (ON OR AFTER MARCH 80.  You can start the simvastatin in a few weeks, after taking a break from th crestor.   Your  Blood sugars are dropping too low. I want you to cut the glipizde in half and take 1/2 tablet twice daily before breakfast and evening meal   You are due for your eye exam And your mammogram   Health Maintenance Adopting a healthy lifestyle and getting preventive care can go a long way to promote health and wellness. Talk with your health care provider about what schedule of regular examinations is right for you. This is a good chance for you to check in with your provider about disease prevention and staying healthy. In between checkups, there are plenty of things you can do on your own. Experts have done a lot of research about which lifestyle changes and preventive measures are most likely to keep you healthy. Ask your health care provider for more information. WEIGHT AND DIET  Eat a healthy diet  Be sure to include plenty of vegetables, fruits, low-fat dairy products, and lean protein.  Do not eat a lot of foods high in solid fats, added sugars, or salt.  Get regular exercise. This is one of the most important things you can do for your health.  Most adults should exercise for at least 150 minutes each week. The exercise should increase your heart rate and make you sweat (moderate-intensity exercise).  Most adults should also do strengthening exercises at least twice a week. This is in addition to the moderate-intensity exercise.  Maintain a healthy weight  Body mass index (BMI) is a measurement that can be used to identify possible weight problems. It estimates body fat based on height and weight. Your health care  provider can help determine your BMI and help you achieve or maintain a healthy weight.  For females 74 years of age and older:   A BMI below 18.5 is considered underweight.  A BMI of 18.5 to 24.9 is normal.  A BMI of 25 to 29.9 is considered overweight.  A BMI of 30 and above is considered obese.  Watch levels of cholesterol and blood lipids  You should start having your blood tested for lipids and cholesterol at 78 years of age, then have this test every 5 years.  You may need to have your cholesterol levels checked more often if:  Your lipid or cholesterol levels are high.  You are older than 78 years of age.  You are at high risk for heart disease.  CANCER SCREENING   Lung Cancer  Lung cancer screening is recommended for adults 60-38 years old who are at high risk for lung cancer because of a history of smoking.  A yearly low-dose CT scan of the lungs is recommended for people who:  Currently smoke.  Have quit within the past 15 years.  Have at least a 30-pack-year history of smoking. A pack year is smoking an average of one pack of cigarettes a day for 1 year.  Yearly screening should continue until it has been 15 years since you quit.  Yearly screening should stop if you develop a health problem that would  prevent you from having lung cancer treatment.  Breast Cancer  Practice breast self-awareness. This means understanding how your breasts normally appear and feel.  It also means doing regular breast self-exams. Let your health care provider know about any changes, no matter how small.  If you are in your 20s or 30s, you should have a clinical breast exam (CBE) by a health care provider every 1-3 years as part of a regular health exam.  If you are 71 or older, have a CBE every year. Also consider having a breast X-ray (mammogram) every year.  If you have a family history of breast cancer, talk to your health care provider about genetic screening.  If you  are at high risk for breast cancer, talk to your health care provider about having an MRI and a mammogram every year.  Breast cancer gene (BRCA) assessment is recommended for women who have family members with BRCA-related cancers. BRCA-related cancers include:  Breast.  Ovarian.  Tubal.  Peritoneal cancers.  Results of the assessment will determine the need for genetic counseling and BRCA1 and BRCA2 testing. Cervical Cancer Routine pelvic examinations to screen for cervical cancer are no longer recommended for nonpregnant women who are considered low risk for cancer of the pelvic organs (ovaries, uterus, and vagina) and who do not have symptoms. A pelvic examination may be necessary if you have symptoms including those associated with pelvic infections. Ask your health care provider if a screening pelvic exam is right for you.   The Pap test is the screening test for cervical cancer for women who are considered at risk.  If you had a hysterectomy for a problem that was not cancer or a condition that could lead to cancer, then you no longer need Pap tests.  If you are older than 65 years, and you have had normal Pap tests for the past 10 years, you no longer need to have Pap tests.  If you have had past treatment for cervical cancer or a condition that could lead to cancer, you need Pap tests and screening for cancer for at least 20 years after your treatment.  If you no longer get a Pap test, assess your risk factors if they change (such as having a new sexual partner). This can affect whether you should start being screened again.  Some women have medical problems that increase their chance of getting cervical cancer. If this is the case for you, your health care provider may recommend more frequent screening and Pap tests.  The human papillomavirus (HPV) test is another test that may be used for cervical cancer screening. The HPV test looks for the virus that can cause cell changes in  the cervix. The cells collected during the Pap test can be tested for HPV.  The HPV test can be used to screen women 80 years of age and older. Getting tested for HPV can extend the interval between normal Pap tests from three to five years.  An HPV test also should be used to screen women of any age who have unclear Pap test results.  After 78 years of age, women should have HPV testing as often as Pap tests.  Colorectal Cancer  This type of cancer can be detected and often prevented.  Routine colorectal cancer screening usually begins at 78 years of age and continues through 78 years of age.  Your health care provider may recommend screening at an earlier age if you have risk factors for colon cancer.  Your health care provider may also recommend using home test kits to check for hidden blood in the stool.  A small camera at the end of a tube can be used to examine your colon directly (sigmoidoscopy or colonoscopy). This is done to check for the earliest forms of colorectal cancer.  Routine screening usually begins at age 89.  Direct examination of the colon should be repeated every 5-10 years through 78 years of age. However, you may need to be screened more often if early forms of precancerous polyps or small growths are found. Skin Cancer  Check your skin from head to toe regularly.  Tell your health care provider about any new moles or changes in moles, especially if there is a change in a mole's shape or color.  Also tell your health care provider if you have a mole that is larger than the size of a pencil eraser.  Always use sunscreen. Apply sunscreen liberally and repeatedly throughout the day.  Protect yourself by wearing long sleeves, pants, a wide-brimmed hat, and sunglasses whenever you are outside. HEART DISEASE, DIABETES, AND HIGH BLOOD PRESSURE   Have your blood pressure checked at least every 1-2 years. High blood pressure causes heart disease and increases the  risk of stroke.  If you are between 75 years and 98 years old, ask your health care provider if you should take aspirin to prevent strokes.  Have regular diabetes screenings. This involves taking a blood sample to check your fasting blood sugar level.  If you are at a normal weight and have a low risk for diabetes, have this test once every three years after 78 years of age.  If you are overweight and have a high risk for diabetes, consider being tested at a younger age or more often. PREVENTING INFECTION  Hepatitis B  If you have a higher risk for hepatitis B, you should be screened for this virus. You are considered at high risk for hepatitis B if:  You were born in a country where hepatitis B is common. Ask your health care provider which countries are considered high risk.  Your parents were born in a high-risk country, and you have not been immunized against hepatitis B (hepatitis B vaccine).  You have HIV or AIDS.  You use needles to inject street drugs.  You live with someone who has hepatitis B.  You have had sex with someone who has hepatitis B.  You get hemodialysis treatment.  You take certain medicines for conditions, including cancer, organ transplantation, and autoimmune conditions. Hepatitis C  Blood testing is recommended for:  Everyone born from [birth year] through 1963.  Anyone with known risk factors for hepatitis C. Sexually transmitted infections (STIs)  You should be screened for sexually transmitted infections (STIs) including gonorrhea and chlamydia if:  You are sexually active and are younger than 78 years of age.  You are older than 78 years of age and your health care provider tells you that you are at risk for this type of infection.  Your sexual activity has changed since you were last screened and you are at an increased risk for chlamydia or gonorrhea. Ask your health care provider if you are at risk.  If you do not have HIV, but are at risk, it  may be recommended that you take a prescription medicine daily to prevent HIV infection. This is called pre-exposure prophylaxis (PrEP). You are considered at risk if:  You are sexually active and do not regularly use  condoms or know the HIV status of your partner(s).  You take drugs by injection.  You are sexually active with a partner who has HIV. Talk with your health care provider about whether you are at high risk of being infected with HIV. If you choose to begin PrEP, you should first be tested for HIV. You should then be tested every 3 months for as long as you are taking PrEP.  PREGNANCY   If you are premenopausal and you may become pregnant, ask your health care provider about preconception counseling.  If you may become pregnant, take 400 to 800 micrograms (mcg) of folic acid every day.  If you want to prevent pregnancy, talk to your health care provider about birth control (contraception). OSTEOPOROSIS AND MENOPAUSE   Osteoporosis is a disease in which the bones lose minerals and strength with aging. This can result in serious bone fractures. Your risk for osteoporosis can be identified using a bone density scan.  If you are 15 years of age or older, or if you are at risk for osteoporosis and fractures, ask your health care provider if you should be screened.  Ask your health care provider whether you should take a calcium or vitamin D supplement to lower your risk for osteoporosis.  Menopause may have certain physical symptoms and risks.  Hormone replacement therapy may reduce some of these symptoms and risks. Talk to your health care provider about whether hormone replacement therapy is right for you.  HOME CARE INSTRUCTIONS   Schedule regular health, dental, and eye exams.  Stay current with your immunizations.   Do not use any tobacco products including cigarettes, chewing tobacco, or electronic cigarettes.  If you are pregnant, do not drink alcohol.  If you are  breastfeeding, limit how much and how often you drink alcohol.  Limit alcohol intake to no more than 1 drink per day for nonpregnant women. One drink equals 12 ounces of beer, 5 ounces of wine, or 1 ounces of hard liquor.  Do not use street drugs.  Do not share needles.  Ask your health care provider for help if you need support or information about quitting drugs.  Tell your health care provider if you often feel depressed.  Tell your health care provider if you have ever been abused or do not feel safe at home. Document Released: 12/15/2010 Document Revised: 10/16/2013 Document Reviewed: 05/03/2013 [facility] Patient Information 2015 [place], [place]. This information is not intended to replace advice given to you by your health care provider. Make sure you discuss any questions you have with your health care provider.

## 2014-05-22 NOTE — Progress Notes (Signed)
Pre-visit discussion using our clinic review tool. No additional management support is needed unless otherwise documented below in the visit note.  

## 2014-05-22 NOTE — Progress Notes (Signed)
Patient ID: Leah Holmes, female   DOB: August 31, 1926, 80 y.o.   MRN: [id number]  The patient is here for annual Medicare wellness examination and management of other chronic and acute problems, including hypertension and diabetes.  Has been having hypoglycemic events occurring once or twice per week  Lab Results  Component Value Date   HGBA1C 6.3 02/15/2014       The risk factors are reflected in the social history.  The roster of all physicians providing medical care to patient - is listed in the Snapshot section of the chart.  Activities of daily living:  The patient is 100% independent in all ADLs: dressing, toileting, feeding as well as independent mobility  Home safety : The patient has smoke detectors in the home. They wear seatbelts.  There are no firearms at home. There is no violence in the home.   There is no risks for hepatitis, STDs or HIV. There is no   history of blood transfusion. They have no travel history to infectious disease endemic areas of the world.  The patient has seen their dentist in the last six month. They have seen their eye doctor in the last year. They admit to slight hearing difficulty with regard to whispered voices and some television programs.  They have deferred audiologic testing in the last year.  They do not  have excessive sun exposure. Discussed the need for sun protection: hats, long sleeves and use of sunscreen if there is significant sun exposure.   Diet: the importance of a healthy diet is discussed. They do have a healthy diet.  The benefits of regular aerobic exercise were discussed. She walks 4 times per week ,  20 minutes.   Depression screen: there are no signs or vegative symptoms of depression- irritability, change in appetite, anhedonia, sadness/tearfullness.  Cognitive assessment: the patient manages all their financial and personal affairs and is actively engaged. They could relate day,date,year and events; recalled 2/3 objects at 3  minutes; performed clock-face test normally.  The following portions of the patient's history were reviewed and updated as appropriate: allergies, current medications, past family history, past medical history,  past surgical history, past social history  and problem list.  Visual acuity was not assessed per patient preference since she has regular follow up with her ophthalmologist. Hearing and body mass index were assessed and reviewed.   During the course of the visit the patient was educated and counseled about appropriate screening and preventive services including : fall prevention , diabetes screening, nutrition counseling, colorectal cancer screening, and recommended immunizations.    BP 156/70 mmHg  Pulse 71  Temp(Src) 97.9 F (36.6 C) (Oral)  Resp 16  Ht 5\' 5"  (1.651 m)  Wt 146 lb 4 oz (66.339 kg)  BMI 24.34 kg/m2  SpO2 98%  General appearance: alert, cooperative and appears stated age Head: Normocephalic, without obvious abnormality, atraumatic Eyes: conjunctivae/corneas clear. PERRL, EOM's intact. Fundi benign. Ears: normal TM's and external ear canals both ears Nose: Nares normal. Septum midline. Mucosa normal. No drainage or sinus tenderness. Throat: lips, mucosa, and tongue normal; teeth and gums normal Neck: no adenopathy, no carotid bruit, no JVD, supple, symmetrical, trachea midline and thyroid not enlarged, symmetric, no tenderness/mass/nodules Lungs: clear to auscultation bilaterally Breasts: normal appearance, no masses or tenderness Heart: regular rate and rhythm, S1, S2 normal, no murmur, click, rub or gallop Abdomen: soft, non-tender; bowel sounds normal; no masses,  no organomegaly Extremities: extremities normal, atraumatic, no cyanosis or edema Pulses: 2+  and symmetric Skin: Skin color, texture, turgor normal. No rashes or lesions Neurologic: Alert and oriented X 3, normal strength and tone. Normal symmetric reflexes. Normal coordination and gait.     Assessment and Plan:  Problem List Items Addressed This Visit      Cardiovascular and Mediastinum   Essential hypertension, benign - Primary    Elevated today.  Reviewed list of meds, patient is not taking OTC meds that could be causing It.  Have asked patient to increase her atenolol to 50 mg twice daily      Relevant Medications      atenolol (TENORMIN) tablet      simvastatin (ZOCOR) tablet     Endocrine   Diabetes mellitus without complication    Historically well-controlled on current medications.  hemoglobin A1c has been consistently at or  less than 7.0 . Patient is up-to-date on eye exams and foot exam is normal today. Patient is due for urine microalbumin to creatinine ratio at next visit. Patient is tolerating statin therapy for CAD risk reduction and on ACE/ARB for reduction in proteinuria.  Current labs are pending   Lab Results  Component Value Date   HGBA1C 6.3 05/22/2014   Lab Results  Component Value Date   MICROALBUR 0.6 05/22/2014       Relevant Medications      simvastatin (ZOCOR) tablet   Other Relevant Orders      Hemoglobin A1c (Completed)      Comprehensive metabolic panel (Completed)      Microalbumin / creatinine urine ratio (Completed)     Other   Hyperlipidemia associated with type 2 diabetes mellitus    Well controlled on current statin therapy.   Liver enzymes are normal , but she is requesting a change to simvastatin,  Lab Results  Component Value Date   CHOL 145 02/15/2014   HDL 57.30 02/15/2014   LDLCALC 76 02/15/2014   LDLDIRECT 141.7 03/08/2013   TRIG 57.0 02/15/2014   CHOLHDL 3 02/15/2014   . Lab Results  Component Value Date   ALT 17 05/22/2014   AST 22 05/22/2014   ALKPHOS 56 05/22/2014   BILITOT 0.7 05/22/2014       Relevant Medications      atenolol (TENORMIN) tablet      simvastatin (ZOCOR) tablet   Low serum vitamin D   Medicare annual wellness visit, subsequent    Annual Medicare wellness  exam was done as well  as a comprehensive physical exam and management of acute and chronic conditions .  During the course of the visit the patient was educated and counseled about appropriate screening and preventive services including : fall prevention , diabetes screening, nutrition counseling, colorectal cancer screening, and recommended immunizations.  Printed recommendations for health maintenance screenings was given.

## 2014-05-23 DIAGNOSIS — Z0000 Encounter for general adult medical examination without abnormal findings: Secondary | ICD-10-CM | POA: Insufficient documentation

## 2014-05-23 NOTE — Assessment & Plan Note (Signed)
Historically well-controlled on current medications.  hemoglobin A1c has been consistently at or  less than 7.0 . Patient is up-to-date on eye exams and foot exam is normal today. Patient is due for urine microalbumin to creatinine ratio at next visit. Patient is tolerating statin therapy for CAD risk reduction and on ACE/ARB for reduction in proteinuria.  Current labs are pending   Lab Results  Component Value Date   HGBA1C 6.3 05/22/2014   Lab Results  Component Value Date   MICROALBUR 0.6 05/22/2014

## 2014-05-23 NOTE — Assessment & Plan Note (Signed)
Well controlled on current statin therapy.   Liver enzymes are normal , but she is requesting a change to simvastatin,  Lab Results  Component Value Date   CHOL 145 02/15/2014   HDL 57.30 02/15/2014   LDLCALC 76 02/15/2014   LDLDIRECT 141.7 03/08/2013   TRIG 57.0 02/15/2014   CHOLHDL 3 02/15/2014   . Lab Results  Component Value Date   ALT 17 05/22/2014   AST 22 05/22/2014   ALKPHOS 56 05/22/2014   BILITOT 0.7 05/22/2014

## 2014-05-23 NOTE — Assessment & Plan Note (Signed)
Elevated today.  Reviewed list of meds, patient is not taking OTC meds that could be causing It.  Have asked patient to increase her atenolol to 50 mg twice daily

## 2014-05-23 NOTE — Assessment & Plan Note (Signed)

## 2014-05-25 ENCOUNTER — Other Ambulatory Visit: Payer: Self-pay | Admitting: *Deleted

## 2014-05-25 MED ORDER — OMEPRAZOLE 20 MG PO CPDR: 20.0000 mg | DELAYED_RELEASE_CAPSULE | Freq: Every day | ORAL | Status: DC

## 2014-06-11 DIAGNOSIS — I4891 Unspecified atrial fibrillation: Secondary | ICD-10-CM | POA: Diagnosis not present

## 2014-07-10 DIAGNOSIS — I4891 Unspecified atrial fibrillation: Secondary | ICD-10-CM | POA: Diagnosis not present

## 2014-07-11 ENCOUNTER — Other Ambulatory Visit: Payer: Self-pay | Admitting: Internal Medicine

## 2014-08-08 DIAGNOSIS — I4891 Unspecified atrial fibrillation: Secondary | ICD-10-CM | POA: Diagnosis not present

## 2014-08-16 ENCOUNTER — Other Ambulatory Visit: Payer: Self-pay | Admitting: Internal Medicine

## 2014-08-16 ENCOUNTER — Telehealth: Payer: Self-pay | Admitting: Internal Medicine

## 2014-08-16 ENCOUNTER — Encounter: Payer: Self-pay | Admitting: Internal Medicine

## 2014-08-16 DIAGNOSIS — I4891 Unspecified atrial fibrillation: Secondary | ICD-10-CM | POA: Insufficient documentation

## 2014-08-16 DIAGNOSIS — I482 Chronic atrial fibrillation, unspecified: Secondary | ICD-10-CM | POA: Insufficient documentation

## 2014-08-16 NOTE — Telephone Encounter (Signed)
[facility] Patient Name: Leah Holmes DOB: 02/06/1927 Initial Comment Caller states her mother has cold symptoms. Coughing, sore throat. She thinks she may have a fever. Runny nose. Nurse Assessment Nurse: Markus Daft, RN, Sherre Poot Date/Time (Eastern Time): 08/16/2014 10:30:49 AM Confirm and document reason for call. If symptomatic, describe symptoms. ---Caller states her mother has cold symptoms. Coughing, sore throat, Runny nose. No fever. -- RN reached patient. And very runny nose, and cough not as bad as yesterday. Feels like she has a bad cold. Started yesterday. Has the patient traveled out of the country within the last 30 days? ---Not Applicable Does the patient require triage? ---Yes Related visit to physician within the last 2 weeks? ---No Does the PT have any chronic conditions? (i.e. diabetes, asthma, etc.) ---Yes List chronic conditions. ---HTN, NIDDM, Atrial fibrillation Guidelines Guideline Title Affirmed Question Affirmed Notes Common Cold Cold with no complications (all triage questions negative) Final Disposition User [place], RN, [place] Comments Caller asks what she can take for her runny nose that won't affect blood thinners and HTN? - She asked about Coricidin. RN advised most likely ok, but to try home measures first, and would verify with MD. Caller verbalized understanding.

## 2014-08-16 NOTE — Telephone Encounter (Signed)
FYI Pt has a scheduled appt for 08/22/14

## 2014-08-16 NOTE — Telephone Encounter (Signed)
She can take 12.5 to 25 mg benadryl in the evening, and try delsym for the cough.

## 2014-08-16 NOTE — Telephone Encounter (Signed)
Pt notified and  verbalized understanding. Advised to call back with worsening or persistent symptoms.

## 2014-08-21 DIAGNOSIS — I2510 Atherosclerotic heart disease of native coronary artery without angina pectoris: Secondary | ICD-10-CM | POA: Diagnosis not present

## 2014-08-21 DIAGNOSIS — I482 Chronic atrial fibrillation: Secondary | ICD-10-CM | POA: Diagnosis not present

## 2014-08-21 DIAGNOSIS — I10 Essential (primary) hypertension: Secondary | ICD-10-CM | POA: Diagnosis not present

## 2014-08-21 DIAGNOSIS — I517 Cardiomegaly: Secondary | ICD-10-CM | POA: Diagnosis not present

## 2014-08-22 ENCOUNTER — Ambulatory Visit (INDEPENDENT_AMBULATORY_CARE_PROVIDER_SITE_OTHER): Payer: Medicare Other | Admitting: Internal Medicine

## 2014-08-22 ENCOUNTER — Encounter: Payer: Self-pay | Admitting: Internal Medicine

## 2014-08-22 VITALS — BP 134/64 | HR 61 | Temp 97.8°F | Resp 16 | Ht 65.0 in | Wt 148.0 lb

## 2014-08-22 DIAGNOSIS — E1169 Type 2 diabetes mellitus with other specified complication: Secondary | ICD-10-CM | POA: Diagnosis not present

## 2014-08-22 DIAGNOSIS — R7989 Other specified abnormal findings of blood chemistry: Secondary | ICD-10-CM

## 2014-08-22 DIAGNOSIS — E119 Type 2 diabetes mellitus without complications: Secondary | ICD-10-CM

## 2014-08-22 DIAGNOSIS — E559 Vitamin D deficiency, unspecified: Secondary | ICD-10-CM

## 2014-08-22 DIAGNOSIS — E785 Hyperlipidemia, unspecified: Secondary | ICD-10-CM

## 2014-08-22 DIAGNOSIS — B9789 Other viral agents as the cause of diseases classified elsewhere: Secondary | ICD-10-CM

## 2014-08-22 DIAGNOSIS — Z23 Encounter for immunization: Secondary | ICD-10-CM

## 2014-08-22 DIAGNOSIS — I10 Essential (primary) hypertension: Secondary | ICD-10-CM

## 2014-08-22 DIAGNOSIS — I4891 Unspecified atrial fibrillation: Secondary | ICD-10-CM

## 2014-08-22 DIAGNOSIS — J069 Acute upper respiratory infection, unspecified: Secondary | ICD-10-CM | POA: Diagnosis not present

## 2014-08-22 LAB — HEMOGLOBIN A1C: Hgb A1c MFr Bld: 6.3 % (ref 4.6–6.5)

## 2014-08-22 LAB — COMPREHENSIVE METABOLIC PANEL
ALBUMIN: 3.7 g/dL (ref 3.5–5.2)
ALT: 16 U/L (ref 0–35)
AST: 19 U/L (ref 0–37)
Alkaline Phosphatase: 61 U/L (ref 39–117)
BUN: 11 mg/dL (ref 6–23)
CO2: 28 meq/L (ref 19–32)
Calcium: 9.6 mg/dL (ref 8.4–10.5)
Chloride: 103 mEq/L (ref 96–112)
Creatinine, Ser: 0.68 mg/dL (ref 0.40–1.20)
GFR: 87.2 mL/min (ref >60.00)
Glucose, Bld: 133 mg/dL — ABNORMAL HIGH (ref 70–99)
POTASSIUM: 4.3 meq/L (ref 3.5–5.1)
Sodium: 138 mEq/L (ref 135–145)
TOTAL PROTEIN: 6.3 g/dL (ref 6.0–8.3)
Total Bilirubin: 0.5 mg/dL (ref 0.2–1.2)

## 2014-08-22 LAB — HM DIABETES FOOT EXAM: HM Diabetic Foot Exam: NORMAL

## 2014-08-22 LAB — VITAMIN D 25 HYDROXY (VIT D DEFICIENCY, FRACTURES): VITD: 17.54 ng/mL — ABNORMAL LOW (ref 30.00–100.00)

## 2014-08-22 LAB — LDL CHOLESTEROL, DIRECT: LDL DIRECT: 114 mg/dL

## 2014-08-22 MED ORDER — GLIPIZIDE ER 2.5 MG PO TB24: 2.5000 mg | ORAL_TABLET | Freq: Every day | ORAL | Status: DC

## 2014-08-22 NOTE — Progress Notes (Addendum)
Patient ID: Leah Holmes, female   DOB: 1927-06-08, 80 y.o.   MRN: [id number]  Patient Active Problem List   Diagnosis Date Noted  . Viral URI with cough 08/25/2014  . Atrial fibrillation 08/16/2014  . Medicare annual wellness visit, subsequent 05/23/2014  . Dry mouth 03/10/2014  . Low serum vitamin D 02/16/2014  . Rectal bleeding 09/20/2013  . Dysphagia, unspecified(787.20) 09/20/2013  . Anal polyp 09/20/2013  . Personal history of colonic polyps 08/31/2013  . CAD (coronary artery disease) 03/09/2013  . Diabetes mellitus without complication 75/64/3329  . Hyperlipidemia associated with type 2 diabetes mellitus 03/09/2013  . Essential hypertension, benign 03/09/2013  . Nonulcer dyspepsia 03/09/2013    Subjective:  CC:   Chief Complaint  Patient presents with  . Follow-up  . Diabetes    HPI:   Leah Holmes is a 81 y.o. female who presents for   3 month follow up on DM type 2, hyperlipidemia, hypovitaminosis D and  hypertension. .   She has brought a log of her BS,  She is Checking sugars twice daily, which are well controlled,  But   has had several   3 am lows  And does not take a bedtime snack  .  Taking 2.5 mg bid of.glipizide  8 pm sugars 120 to 180  Getting over a viral syndrome,  Cough improving,  No fevers,  sore throat or ear pain  Denies shortness of breath and pleurisy.  No recent travel.    Past Medical History  Diagnosis Date  . Diabetes mellitus without complication   . Ulcer   . Hyperlipidemia   . Hypertension   . Polyp of colon   . Atrial fibrillation 2012  . GERD (gastroesophageal reflux disease)   . Irritable bowel syndrome   . Colon polyp 2003    Past Surgical History  Procedure Laterality Date  . Appendectomy    . Breast surgery    . Abdominal hysterectomy    . Cholecystectomy  03-2012  . Colonoscopy  2003    Dr Theodosia Paling in [place]  . Upper gi endoscopy  2003    Dr Theodosia Paling in [place]  . Fibroid tumor    . Abdominal adhesion surgery          The following portions of the patient's history were reviewed and updated as appropriate: Allergies, current medications, and problem list.    Review of Systems:   Patient denies headache, fevers, malaise, unintentional weight loss, skin rash, eye pain, sinus congestion and sinus pain, sore throat, dysphagia,  hemoptysis , cough, dyspnea, wheezing, chest pain, palpitations, orthopnea, edema, abdominal pain, nausea, melena, diarrhea, constipation, flank pain, dysuria, hematuria, urinary  Frequency, nocturia, numbness, tingling, seizures,  Focal weakness, Loss of consciousness,  Tremor, insomnia, depression, anxiety, and suicidal ideation.     History   Social History  . Marital Status: Widowed    Spouse Name: N/A  . Number of Children: N/A  . Years of Education: N/A   Occupational History  . Not on file.   Social History Main Topics  . Smoking status: Never Smoker   . Smokeless tobacco: Current User    Types: Snuff     Comment: occasionally  . Alcohol Use: No  . Drug Use: No  . Sexual Activity: No   Other Topics Concern  . Not on file   Social History Narrative    Objective:  Filed Vitals:   08/22/14 0924  BP: 134/64  Pulse: 61  Temp: 97.8 F (36.6  C)  Resp: 16     General appearance: alert, cooperative and appears stated age Ears: normal TM's and external ear canals both ears Throat: lips, mucosa, and tongue normal; teeth and gums normal Neck: no adenopathy, no carotid bruit, supple, symmetrical, trachea midline and thyroid not enlarged, symmetric, no tenderness/mass/nodules Back: symmetric, no curvature. ROM normal. No CVA tenderness. Lungs: clear to auscultation bilaterally Heart: regular rate and rhythm, S1, S2 normal, no murmur, click, rub or gallop Abdomen: soft, non-tender; bowel sounds normal; no masses,  no organomegaly Pulses: 2+ and symmetric Skin: Skin color, texture, turgor normal. No rashes or lesions Lymph nodes: Cervical,  supraclavicular, and axillary nodes normal.  Assessment and Plan:  Essential hypertension, benign Well controlled on current regimen. Renal function stable, no changes today.  Lab Results  Component Value Date   CREATININE 0.68 08/22/2014   Lab Results  Component Value Date   NA 138 08/22/2014   K 4.3 08/22/2014   CL 103 08/22/2014   CO2 28 08/22/2014      Diabetes mellitus without complication Remains well-controlled on current medications.  hemoglobin A1c has been consistently at or  less than 7.0 .We have changed her glipizide to 2.5 mg XL and advised her to drink a glass of milk or other snack at bedtime.  Patient is up-to-date on eye exams and foot exam is normal today. Patient has no proteinuria . Patient is tolerating statin therapy for CAD risk reduction and on ACE/ARB for reduction in proteinuria.    Lab Results  Component Value Date   HGBA1C 6.3 08/22/2014   Lab Results  Component Value Date   MICROALBUR 0.6 05/22/2014        Hyperlipidemia associated with type 2 diabetes mellitus Well controlled on current statin therapy.   Liver enzymes are normal , but she is requesting a change to simvastatin,  Lab Results  Component Value Date   CHOL 145 02/15/2014   HDL 57.30 02/15/2014   LDLCALC 76 02/15/2014   LDLDIRECT 114.0 08/22/2014   TRIG 57.0 02/15/2014   CHOLHDL 3 02/15/2014   . Lab Results  Component Value Date   ALT 16 08/22/2014   AST 19 08/22/2014   ALKPHOS 61 08/22/2014   BILITOT 0.5 08/22/2014        Atrial fibrillation Managed by cardiology with coumadin.  She has no history of falls.    Viral URI with cough URI is most likely viral given the mild HEENT  Symptoms  And normal exam.   I have reassured her  that in viral URIS, an antibiotic will not help the symptoms and will increase the risk of developing diarrhea.,  Continue oral and nasal decongestants, and tylenol 650 mq 8 hrs for aches and pains,     Low serum vitamin D Level  very low again at 17.  Drisdol rxd for 3 momths    A total of 25 minutes of face to face time was spent with patient more than half of which was spent in counselling on the above mentioned issues.   Updated Medication List Outpatient Encounter Prescriptions as of 08/22/2014  Medication Sig  . aspirin 81 MG tablet Take 81 mg by mouth daily.  Marland Kitchen atenolol (TENORMIN) 50 MG tablet Take 1 tablet (50 mg total) by mouth 2 (two) times daily.  . ergocalciferol (DRISDOL) 50000 UNITS capsule Take 1 capsule (50,000 Units total) by mouth once a week.  Marland Kitchen glucose blood (ONE TOUCH ULTRA TEST) test strip Check sugar bid Dx E11.9  .  glycerin adult (GLYCERIN ADULT) 2 G SUPP Place 1 suppository rectally once as needed for moderate constipation.  . hydrochlorothiazide (HYDRODIURIL) 25 MG tablet Take 25 mg by mouth daily. Takes 25 mg every other day  . isosorbide mononitrate (IMDUR) 60 MG 24 hr tablet Take 60 mg by mouth 2 (two) times daily.   . isosorbide mononitrate (IMDUR) 60 MG 24 hr tablet   . lisinopril (PRINIVIL,ZESTRIL) 5 MG tablet Take 1 tablet (5 mg total) by mouth daily.  . meclizine (ANTIVERT) 25 MG tablet Take 1 tablet (25 mg total) by mouth 3 (three) times daily as needed.  . nitroGLYCERIN (NITROSTAT) 0.4 MG SL tablet Place 0.4 mg under the tongue every 5 (five) minutes as needed for chest pain.  Marland Kitchen nystatin (MYCOSTATIN) 100000 UNIT/ML suspension Take 5 mLs (500,000 Units total) by mouth 4 (four) times daily.  Marland Kitchen omeprazole (PRILOSEC) 20 MG capsule Take 1 capsule (20 mg total) by mouth daily.  [name] DELICA LANCETS 73A MISC Check sugar bid Dx E11.9  . psyllium (METAMUCIL) 58.6 % powder Take 1 packet by mouth daily.  . simvastatin (ZOCOR) 20 MG tablet Take 1 tablet by mouth at bedtime. One tablet every other day.  . warfarin (COUMADIN) 2 MG tablet TAKE TWO TABLETS BY MOUTH EVERY NIGHT  . [DISCONTINUED] glipiZIDE (GLUCOTROL) 5 MG tablet TAKE ONE TABLET BY MOUTH TWICE DAILY BEFORE A MEAL  .  ergocalciferol (DRISDOL) 50000 UNITS capsule Take 1 capsule (50,000 Units total) by mouth once a week.  Marland Kitchen glipiZIDE (GLUCOTROL XL) 2.5 MG 24 hr tablet Take 1 tablet (2.5 mg total) by mouth daily with breakfast.  . [DISCONTINUED] rosuvastatin (CRESTOR) 5 MG tablet Take by mouth.     Orders Placed This Encounter  Procedures  . Pneumococcal polysaccharide vaccine 23-valent greater than or equal to 2yo subcutaneous/IM  . Hemoglobin A1c  . Comprehensive metabolic panel  . LDL cholesterol, direct  . Vit D  25 hydroxy (rtn osteoporosis monitoring)  . HM DIABETES EYE EXAM  . HM DIABETES FOOT EXAM    Return in about 6 months (around 02/22/2015) for follow up diabetes.

## 2014-08-22 NOTE — Patient Instructions (Addendum)
Please go get your annual diabetic eye exam as scheduled   Please drink a gglass of milk before bedtime to prevent recurrent low blood sugars in the middle of the night  I have changed your glipzide to ONCE DAILY IN THE MORNING  2.5 MG  Do not take the glipizide if you wake up and feel like your appetite is not good,

## 2014-08-22 NOTE — Progress Notes (Signed)
Pre-visit discussion using our clinic review tool. No additional management support is needed unless otherwise documented below in the visit note.  

## 2014-08-25 ENCOUNTER — Encounter: Payer: Self-pay | Admitting: Internal Medicine

## 2014-08-25 DIAGNOSIS — B9789 Other viral agents as the cause of diseases classified elsewhere: Secondary | ICD-10-CM

## 2014-08-25 DIAGNOSIS — J069 Acute upper respiratory infection, unspecified: Secondary | ICD-10-CM | POA: Insufficient documentation

## 2014-08-25 MED ORDER — ERGOCALCIFEROL 1.25 MG (50000 UT) PO CAPS: 50000.0000 [IU] | ORAL_CAPSULE | ORAL | Status: DC

## 2014-08-25 NOTE — Assessment & Plan Note (Signed)
Well controlled on current statin therapy.   Liver enzymes are normal , but she is requesting a change to simvastatin,  Lab Results  Component Value Date   CHOL 145 02/15/2014   HDL 57.30 02/15/2014   LDLCALC 76 02/15/2014   LDLDIRECT 114.0 08/22/2014   TRIG 57.0 02/15/2014   CHOLHDL 3 02/15/2014   . Lab Results  Component Value Date   ALT 16 08/22/2014   AST 19 08/22/2014   ALKPHOS 61 08/22/2014   BILITOT 0.5 08/22/2014

## 2014-08-25 NOTE — Addendum Note (Signed)
Addended by: Crecencio Mc on: 08/25/2014 04:53 PM   Modules accepted: Orders

## 2014-08-25 NOTE — Assessment & Plan Note (Signed)
Well controlled on current regimen. Renal function stable, no changes today.  Lab Results  Component Value Date   CREATININE 0.68 08/22/2014   Lab Results  Component Value Date   NA 138 08/22/2014   K 4.3 08/22/2014   CL 103 08/22/2014   CO2 28 08/22/2014

## 2014-08-25 NOTE — Assessment & Plan Note (Signed)
URI is most likely viral given the mild HEENT  Symptoms  And normal exam.   I have reassured her  that in viral URIS, an antibiotic will not help the symptoms and will increase the risk of developing diarrhea.,  Continue oral and nasal decongestants, and tylenol 650 mq 8 hrs for aches and pains,

## 2014-08-25 NOTE — Assessment & Plan Note (Signed)
Remains well-controlled on current medications.  hemoglobin A1c has been consistently at or  less than 7.0 .We have changed her glipizide to 2.5 mg XL and advised her to drink a glass of milk or other snack at bedtime.  Patient is up-to-date on eye exams and foot exam is normal today. Patient has no proteinuria . Patient is tolerating statin therapy for CAD risk reduction and on ACE/ARB for reduction in proteinuria.    Lab Results  Component Value Date   HGBA1C 6.3 08/22/2014   Lab Results  Component Value Date   MICROALBUR 0.6 05/22/2014

## 2014-08-25 NOTE — Assessment & Plan Note (Signed)
Level very low again at 17.  Drisdol rxd for 3 momths

## 2014-08-25 NOTE — Assessment & Plan Note (Signed)
Managed by cardiology with coumadin.  She has no history of falls.

## 2014-08-27 ENCOUNTER — Encounter: Payer: Self-pay | Admitting: *Deleted

## 2014-09-05 DIAGNOSIS — I4891 Unspecified atrial fibrillation: Secondary | ICD-10-CM | POA: Diagnosis not present

## 2014-10-02 NOTE — Discharge Summary (Signed)
PATIENT NAME:  Leah Holmes, Leah Holmes MR#:  [id number] DATE OF BIRTH:  1927-05-13  DATE OF ADMISSION:  03/23/2012 DATE OF DISCHARGE:  03/29/2012  DISCHARGE DIAGNOSES:  1. Atrial fibrillation with anticoagulation. 2. Acute cholecystitis with cholelithiasis.   PROCEDURES:  1. Laparoscopic cholecystectomy.  2. Suturing of bleeding port site postoperatively.   CONSULTANTS: Cardiology.   HISTORY OF PRESENT ILLNESS/HOSPITAL COURSE: This is a patient with recurrent episodes of right upper quadrant pain. She has significant atrial fibrillation and coronary artery disease and has had a cardiac catheterization in the past. She was seen by Dr. Marina Gravel in the office and direct admitted to the hospital with signs of probable acute cholecystitis. She was anticoagulated at the time, was placed in the hospital and hydrated, started on IV antibiotics, and allowed to gradually reverse her INR to a more normal condition. Consultation with Cardiology aided in perioperative management. She was ultimately taken to the Operating Room where laparoscopic cholecystectomy was performed. She had a very densely adherent piece of omentum to the gallbladder with vascularized adhesions and signs of severe acute cholecystitis.   Postoperatively, she advanced her diet and was restarted on Coumadin but then started bleeding from one of her lateral port sites on the right side. This was a superficial bleeding vessel and required the placement of two separate figure-of-eight sutures of 3-0 nylon to control the hemorrhage which then abated.   The patient was tolerating a regular diet and was discharged in stable condition. She had restarted all of her medications and was to follow up with both her primary care physician and her cardiologist as well as our office in 10 days. She was given analgesics for postoperative pain control.  ____________________________ Jerrol Banana. Burt Knack, MD rec:cbb D: 04/06/2012 19:26:06 ET T: 04/07/2012 10:58:44  ET JOB#: [id number] Florene Glen MD ELECTRONICALLY SIGNED 04/07/2012 19:39

## 2014-10-02 NOTE — Op Note (Signed)
PATIENT NAME:  Leah Holmes, Leah Holmes MR#:  [id number] DATE OF BIRTH:  1927-05-27  DATE OF PROCEDURE:  03/26/2012  PREOPERATIVE DIAGNOSIS: Biliary colic and cholelithiasis.   POSTOPERATIVE DIAGNOSIS: Biliary colic and cholelithiasis.    PROCEDURE: Laparoscopic cholecystectomy.   SURGEON: Phoebe Perch, M.D.   ANESTHESIA: General with endotracheal tube.   INDICATIONS: This is a patient with recurrent episodes of right upper quadrant pain associated with fatty food intolerance and work-up showing gallstones. She has multiple medical problems and her Coumadin has been held. Her INR is at a safe level at this point. She has been bridged with heparin and is here for cholecystectomy, understanding her increased risk of bleeding and the need to restart her Coumadin.   FINDINGS: Multiple gallstones, small in nature. The gallbladder itself was totally encased in omentum with vascularized adhesions. No bowel was identified around the gallbladder in those adhesions. Infraumbilical adhesions without bowel in them either. No sign of bowel injury.   DESCRIPTION OF PROCEDURE: The patient was induced to general anesthesia. She was on IV antibiotics. VTE prophylaxis was in place. She was prepped and draped in a sterile fashion. Marcaine was infiltrated in skin and subcutaneous tissues around the supraumbilical area, avoiding the ventral hernia in the periumbilical area. Incision was made. Veress needle was placed. Pneumoperitoneum was obtained. A 5 mm trocar port was placed. The abdominal cavity was explored and under direct vision a 10 mm epigastric port and two lateral 5 mm ports were placed. The camera was placed in the epigastric site to view back to the periumbilical site. There was no sign of bowel injury. The adhesions were infraumbilical and contained no bowel.   The camera was placed back in the periumbilical site and the gallbladder was identified, covered in omentum with long-standing vascularized adhesions  present. These were placed on tension, and then utilizing either electrocautery with sharp dissection, blunt dissection or the use of clips, the omentum was taken off of the gallbladder. The gallbladder was then dissected free. The cystic artery was doubly clipped and divided in two branches. The cystic duct was well identified as it entered the infundibulum. Here it was doubly clipped and divided and the gallbladder was taken from the gallbladder fossa with electrocautery and passed out through the epigastric port site. The port site was placed. The area was irrigated with copious amounts of normal saline. Hemostasis was adequate. There was no sign of bleeding either from the gallbladder fossa, the porta area or the omental adhesions. A 10 mm JP drain was placed into the liver bed. The foramen of Winslow was obliterated with adhesions and there were adhesions to the liver to the abdominal wall. Therefore, the drain was placed somewhat lateral to the gallbladder fossa, held in with 3-0 nylon after bringing it out through a lateral port site.   The area was again checked for hemostasis. Irrigation was completed. No further sign of bleeding. No bile leak, No bowel injury. The camera was again placed in the periumbilical site. No sign of bowel injury or bleeding. Therefore, pneumoperitoneum was released. All ports were removed. Fascial edges at the epigastric site were approximated with figure-of-eight 0 Vicryls and then additional Marcaine was placed for a total of 30 milliliters. 4-0 subcuticular Monocryl was used on all skin edges. Steri-Strips, Mastisol, and sterile dressings were placed. Drain was placed to bulb suction.   The patient  tolerated the procedure well. There were no complications. She was taken to the recovery room in stable condition to be  admitted for continued care and to restart her Coumadin.  ____________________________ Jerrol Banana. Burt Knack, MD rec:ap D: 03/26/2012 10:34:06  ET T: 03/26/2012 10:48:56 ET JOB#: [id number]  cc: Jerrol Banana. Burt Knack, MD, <Dictator> Florene Glen MD ELECTRONICALLY SIGNED 03/26/2012 11:09

## 2014-10-02 NOTE — Consult Note (Signed)
General Aspect 81 year old female with history of chronic atrial fibrillation, on anti-coagulation, hypertension, hyperlipidemia, diabetes mellitus, valvular heart disease and diffuse three-vessel coronary artery disease with medical management.  Patient started having right upper quadrant pain and recent scan showed patient has cholithiasis and may be pending cholecystectomy. Cardiology consult to for preop exam to risk stratify.  Patient does have variable anginal symptoms.  She had been on Ranexa but this was stopped when she was evaluated by Dr. [name] in office 9/25 due to  no improvement in chest pain.  Her isosorbide was increased to 60 mg twice a day.  Despite this she still has to take an occasional nitroglycerin sublingually.   Atrial fibrillation is rate controlled on atenolol.  Today, she is comfortable   without  any chest pain or shortness of breath.  She continues with intermittent right upper quadrant pain. No congestive heart failure symptoms.  No complaints of racing of her heart.   Physical Exam:   GEN well developed, no acute distress    HEENT pink conjunctivae, hearing intact to voice    RESP normal resp effort  clear BS    CARD Irregular rate and rhythm  A. fib    ABD right upper quadrant pain    EXTR negative edema    SKIN skin turgor decreased    NEURO cranial nerves intact, motor/sensory function intact    PSYCH alert, A+O to time, place, person, good insight   Review of Systems:   Subjective/Chief Complaint abdominal pain    General: No Complaints    Skin: No Complaints    ENT: No Complaints    Eyes: No Complaints    Neck: No Complaints    Respiratory: No Complaints    Cardiovascular: occasional anginal symptoms, occasional palpitations    Gastrointestinal: No Complaints    Genitourinary: No Complaints    Vascular: No Complaints    Musculoskeletal: No Complaints    Neurologic: No Complaints    Hematologic: Ease of bleeding  Coumadin  therapy    Endocrine: No Complaints    Psychiatric: No Complaints    Medications/Allergies Reviewed Medications/Allergies reviewed    Lipitor: Alt Ment Status  Penicillin: Unknown  Reglan: Unknown  Codeine: Unknown  Vital Signs/Nurse's Notes: **Vital Signs.:   09-Oct-13 12:12   Vital Signs Type Admission   Temperature Temperature (F) 97.9   Celsius 36.6   Pulse Pulse 82   Respirations Respirations 17   Systolic BP Systolic BP 417   Diastolic BP (mmHg) Diastolic BP (mmHg) 66   Mean BP 95   Pulse Ox % Pulse Ox % 95   Pulse Ox Activity Level  At rest   Oxygen Delivery Room Air/ 21 %     Impression 1.  Cholelithiasis with abdominal pain and possible pending cholecystectomy, with history of three-vessel diffuse CAD, chronic atrial fibrillation on anticoagulation. Patient is thought to be at least a moderate risk to pursue surgery.  Recommendations are to continue her medical management,  isosorbide, atenolol, lisinopril, amlodipine, and simvastatin and aspirin, with the aspirin being held  as necessary for surgery. Coumadin should be discontinued and Lovenox shots 1 mg/kg every 12 hours should be started with discontinuation 12-24 hours prior to surgery.  Monitoring perioperatively would be advisable, especially after surgery.    Plan Patient seen in collaboration with Dr. [name].   Electronic Signatures: Roderic Palau (NP)  (Signed 09-Oct-13 16:13)  Authored: General Aspect/Present Illness, History and Physical Exam, Review of System, Allergies, Vital Signs/Nurse's Notes,  Impression/Plan   Last Updated: 09-Oct-13 16:13 by Roderic Palau (NP)

## 2014-10-03 DIAGNOSIS — I4891 Unspecified atrial fibrillation: Secondary | ICD-10-CM | POA: Diagnosis not present

## 2014-10-05 ENCOUNTER — Emergency Department
Admit: 2014-10-05 | Disposition: A | Discharge status: Home / Self Care | Payer: Self-pay | Admitting: Emergency Medicine

## 2014-10-05 DIAGNOSIS — E119 Type 2 diabetes mellitus without complications: Secondary | ICD-10-CM | POA: Diagnosis not present

## 2014-10-05 DIAGNOSIS — J449 Chronic obstructive pulmonary disease, unspecified: Secondary | ICD-10-CM | POA: Diagnosis not present

## 2014-10-05 DIAGNOSIS — R1013 Epigastric pain: Secondary | ICD-10-CM | POA: Diagnosis not present

## 2014-10-05 DIAGNOSIS — Z7901 Long term (current) use of anticoagulants: Secondary | ICD-10-CM | POA: Diagnosis not present

## 2014-10-05 DIAGNOSIS — R079 Chest pain, unspecified: Secondary | ICD-10-CM | POA: Diagnosis not present

## 2014-10-05 DIAGNOSIS — Z880 Allergy status to penicillin: Secondary | ICD-10-CM | POA: Diagnosis not present

## 2014-10-05 DIAGNOSIS — K769 Liver disease, unspecified: Secondary | ICD-10-CM | POA: Diagnosis not present

## 2014-10-05 DIAGNOSIS — I10 Essential (primary) hypertension: Secondary | ICD-10-CM | POA: Diagnosis not present

## 2014-10-05 DIAGNOSIS — N281 Cyst of kidney, acquired: Secondary | ICD-10-CM | POA: Diagnosis not present

## 2014-10-05 DIAGNOSIS — R05 Cough: Secondary | ICD-10-CM | POA: Diagnosis not present

## 2014-10-05 DIAGNOSIS — Z9049 Acquired absence of other specified parts of digestive tract: Secondary | ICD-10-CM | POA: Diagnosis not present

## 2014-10-05 LAB — CBC
HCT: 42.9 % (ref 35.0–47.0)
HGB: 14.4 g/dL (ref 12.0–16.0)
MCH: 31.3 pg (ref 26.0–34.0)
MCHC: 33.5 g/dL (ref 32.0–36.0)
MCV: 93 fL (ref 80–100)
Platelet: 205 10*3/uL (ref 150–440)
RBC: 4.6 10*6/uL (ref 3.80–5.20)
RDW: 13.8 % (ref 11.5–14.5)
WBC: 6.6 10*3/uL (ref 3.6–11.0)

## 2014-10-05 LAB — URINALYSIS, COMPLETE
BILIRUBIN, UR: NEGATIVE
Bacteria: NONE SEEN
Glucose,UR: NEGATIVE mg/dL (ref 0–75)
Ketone: NEGATIVE
NITRITE: NEGATIVE
PROTEIN: NEGATIVE
Ph: 7 (ref 4.5–8.0)
Specific Gravity: 1.027 (ref 1.003–1.030)
Squamous Epithelial: NONE SEEN

## 2014-10-05 LAB — BASIC METABOLIC PANEL
Anion Gap: 3 — ABNORMAL LOW (ref 7–16)
BUN: 11 mg/dL
CALCIUM: 9.2 mg/dL
CHLORIDE: 102 mmol/L
CREATININE: 0.69 mg/dL
Co2: 31 mmol/L
EGFR (African American): >60
Glucose: 128 mg/dL — ABNORMAL HIGH
POTASSIUM: 4.1 mmol/L
Sodium: 136 mmol/L

## 2014-10-05 LAB — PROTIME-INR
INR: 2.3
Prothrombin Time: 25.8 secs — ABNORMAL HIGH

## 2014-10-05 LAB — TROPONIN I: Troponin-I: <0.03 ng/mL

## 2014-10-05 LAB — PRO B NATRIURETIC PEPTIDE: B-Type Natriuretic Peptide: 238 pg/mL — ABNORMAL HIGH

## 2014-10-07 LAB — URINE CULTURE

## 2014-10-07 NOTE — Discharge Summary (Signed)
PATIENT NAME:  Leah Holmes, Leah Holmes MR#:  [id number] DATE OF BIRTH:  [birth year]  DATE OF ADMISSION:  06/11/2011 DATE OF DISCHARGE:  06/12/2011  DISCHARGE DIAGNOSES:  1. Chest pain and angina secondary to coronary artery disease, MI ruled out. 2. Malignant hypertension, well controlled now. 3. Diabetes.  4. Hyperlipidemia.  5. Chronic atrial fibrillation, on Coumadin.  6. Hypomagnesemia.   CONSULTATION: Cardiology, Dr. Bernerd Limbo COURSE: This is an 81 year old female who has history of coronary artery disease. She has hypertension and chronic atrial fibrillation on Coumadin. She presented with chest pressure and chest pain. She recently had a cardiac catheterization done in October and the cardiac catheterization showed that the patient had normal LV function, significant LAD stenosis, minor RCA and left circumflex stenosis. Her EKG showed atrial fibrillation with left bundle branch block. She is following with Dr. [name] and she is planned for coronary artery bypass surgery. When she came in, her blood pressure was also high. Her blood pressure was in the range of around 599/357 to 017 systolic. She was admitted to telemetry. Her blood pressure medications were optimized. Her Imdur was increased to 60 mg Holmes.i.d. Norvasc was added to the regimen. Also, atenolol was advanced to 75 Holmes.i.d. When she came in, her creatinine was normal at 0.7. She ruled out for MI. Her LDL was 107. Her hemoglobin A1c was 6.2. Today her magnesium was 1.7. She got one dose of IV mag sulfate. I'm going to give her some p.o. mag oxide for home also. Her creatinine is stable at 0.69. Her chest pain and pressure significantly improved. Her white count remained stable at 5.9, hemoglobin 13.9. Her INR is therapeutic at 2.3. Urinalysis is negative. Chest x-ray only showed findings consistent with COPD, some interstitial markings. No focal pneumonia Dr. [name] saw her and agreed with current medications and suggested discharge to  home. The patient has an appointment with Dr. [name] on 06/15/2011 which she should keep. Her blood pressure is now well controlled. The last blood pressure at discharge is 120/71.   MEDICATIONS AT DISCHARGE:  1. Hydrochlorothiazide 25 mg daily. 2. Lisinopril 20 mg Holmes.i.d.  3. Nitroglycerin p.r.n. sublingual.  4. Omeprazole 20 mg daily.  5. Warfarin total of around 7.5 mg at bedtime. 6. Glipizide 5 mg Holmes.i.d.  7. Change atenolol to 75 mg 1.5 tablets of 50 mg twice a day. 8. Norvasc 10 mg daily.  9. Change Imdur to 60 mg Holmes.i.d.  10. Continue aspirin 81 mg daily.  11. Magnesium oxide 400 mg p.o. Holmes.i.d. for two days.  DIET: Advised a low sodium, ADA low cholesterol diet.   ACTIVITY: No exertional activity. No heavy lifting.   CONDITION AT DISCHARGE: Comfortable. T-max 97.8, heart rate 82, blood pressure 120/71, saturating 97% on room air. Chest is clear. Heart sounds are regular. Abdomen soft, nontender. She ambulated.  FOLLOW-UP: Please keep appointment with Dr. [name] on Monday, 06/15/2011.   TIME SPENT WITH DISCHARGE: 40 minutes.  ____________________________ Mena Pauls, MD ag:drc D: 06/12/2011 15:42:22 ET T: 06/14/2011 15:06:01 ET JOB#: [id number]  cc: Mena Pauls, MD, <Dictator> Corey Skains, MD Mena Pauls MD ELECTRONICALLY SIGNED 07/03/2011 12:21

## 2014-10-10 DIAGNOSIS — H2511 Age-related nuclear cataract, right eye: Secondary | ICD-10-CM | POA: Diagnosis not present

## 2014-10-10 LAB — HM DIABETES EYE EXAM

## 2014-10-12 ENCOUNTER — Encounter: Payer: Self-pay | Admitting: Internal Medicine

## 2014-10-23 ENCOUNTER — Other Ambulatory Visit: Payer: Self-pay | Admitting: Internal Medicine

## 2014-10-23 NOTE — Telephone Encounter (Signed)
Last OV 3.9.16, last refill 2.25.16.  Please advise refill

## 2014-10-23 NOTE — Telephone Encounter (Signed)
Ok to refill,  Refill sent  

## 2014-10-26 DIAGNOSIS — H2511 Age-related nuclear cataract, right eye: Secondary | ICD-10-CM | POA: Diagnosis not present

## 2014-10-29 ENCOUNTER — Telehealth: Payer: Self-pay | Admitting: *Deleted

## 2014-10-29 NOTE — Telephone Encounter (Signed)
Spoke to pt's daughter, states pt is using a pill cutter and still the pill is "disintegrating", she is unable to cut the tablets with a pill cutter. What do you recommend?

## 2014-10-29 NOTE — Telephone Encounter (Signed)
Have her stop the medication and we will follow her blood sugars without it.  She may do fine without it.

## 2014-10-29 NOTE — Telephone Encounter (Signed)
Pt's daughter left VM, stating pt unable to tolerate glipizide 2.5 mg ER, causing stomach upset. Asking if a 2.5 mg tablet can be called in (does it come in that dose?) She is unable to cut the 5 mg in half. Please advise.

## 2014-10-29 NOTE — Telephone Encounter (Signed)
No, it cannot be ordered as 2.5 mg .  Can the daughter use a pill cutter on the 5 mg if i order it?

## 2014-10-30 NOTE — Telephone Encounter (Signed)
Left message, notifying pt's daughter.

## 2014-10-31 DIAGNOSIS — I480 Paroxysmal atrial fibrillation: Secondary | ICD-10-CM | POA: Diagnosis not present

## 2014-11-28 DIAGNOSIS — I480 Paroxysmal atrial fibrillation: Secondary | ICD-10-CM | POA: Diagnosis not present

## 2014-12-04 ENCOUNTER — Telehealth: Payer: Self-pay | Admitting: Internal Medicine

## 2014-12-04 ENCOUNTER — Emergency Department
Admission: EM | Admit: 2014-12-04 | Discharge: 2014-12-04 | Disposition: A | Discharge status: Home / Self Care | Payer: Medicare Other | Attending: Emergency Medicine | Admitting: Emergency Medicine

## 2014-12-04 ENCOUNTER — Emergency Department: Payer: Medicare Other

## 2014-12-04 ENCOUNTER — Encounter: Payer: Self-pay | Admitting: Emergency Medicine

## 2014-12-04 DIAGNOSIS — I25119 Atherosclerotic heart disease of native coronary artery with unspecified angina pectoris: Secondary | ICD-10-CM | POA: Diagnosis not present

## 2014-12-04 DIAGNOSIS — Z880 Allergy status to penicillin: Secondary | ICD-10-CM | POA: Insufficient documentation

## 2014-12-04 DIAGNOSIS — E119 Type 2 diabetes mellitus without complications: Secondary | ICD-10-CM | POA: Diagnosis not present

## 2014-12-04 DIAGNOSIS — J449 Chronic obstructive pulmonary disease, unspecified: Secondary | ICD-10-CM | POA: Diagnosis not present

## 2014-12-04 DIAGNOSIS — R251 Tremor, unspecified: Secondary | ICD-10-CM | POA: Diagnosis not present

## 2014-12-04 DIAGNOSIS — I209 Angina pectoris, unspecified: Secondary | ICD-10-CM

## 2014-12-04 DIAGNOSIS — Z79899 Other long term (current) drug therapy: Secondary | ICD-10-CM | POA: Insufficient documentation

## 2014-12-04 DIAGNOSIS — Z7982 Long term (current) use of aspirin: Secondary | ICD-10-CM | POA: Insufficient documentation

## 2014-12-04 DIAGNOSIS — R079 Chest pain, unspecified: Secondary | ICD-10-CM | POA: Diagnosis present

## 2014-12-04 DIAGNOSIS — I10 Essential (primary) hypertension: Secondary | ICD-10-CM | POA: Diagnosis not present

## 2014-12-04 HISTORY — DX: Atherosclerotic heart disease of native coronary artery without angina pectoris: I25.10

## 2014-12-04 HISTORY — DX: Heart failure, unspecified: I50.9

## 2014-12-04 LAB — PROTIME-INR
INR: 2.26
Prothrombin Time: 25.1 seconds — ABNORMAL HIGH (ref 11.4–15.0)

## 2014-12-04 LAB — CBC
HEMATOCRIT: 45 % (ref 35.0–47.0)
HEMOGLOBIN: 14.9 g/dL (ref 12.0–16.0)
MCH: 31.2 pg (ref 26.0–34.0)
MCHC: 33.2 g/dL (ref 32.0–36.0)
MCV: 93.8 fL (ref 80.0–100.0)
Platelets: 201 10*3/uL (ref 150–440)
RBC: 4.79 MIL/uL (ref 3.80–5.20)
RDW: 13.9 % (ref 11.5–14.5)
WBC: 6.2 10*3/uL (ref 3.6–11.0)

## 2014-12-04 LAB — TROPONIN I

## 2014-12-04 LAB — COMPREHENSIVE METABOLIC PANEL
ALBUMIN: 4.1 g/dL (ref 3.5–5.0)
ALK PHOS: 61 U/L (ref 38–126)
ALT: 19 U/L (ref 14–54)
AST: 32 U/L (ref 15–41)
Anion gap: 5 (ref 5–15)
BUN: 13 mg/dL (ref 6–20)
CO2: 27 mmol/L (ref 22–32)
Calcium: 9.2 mg/dL (ref 8.9–10.3)
Chloride: 101 mmol/L (ref 101–111)
Creatinine, Ser: 0.66 mg/dL (ref 0.44–1.00)
GFR calc Af Amer: >60 mL/min (ref >60)
GFR calc non Af Amer: >60 mL/min (ref >60)
GLUCOSE: 147 mg/dL — AB (ref 65–99)
Potassium: 3.9 mmol/L (ref 3.5–5.1)
Sodium: 133 mmol/L — ABNORMAL LOW (ref 135–145)
Total Bilirubin: 0.7 mg/dL (ref 0.3–1.2)
Total Protein: 7 g/dL (ref 6.5–8.1)

## 2014-12-04 MED ORDER — ASPIRIN 81 MG PO CHEW
CHEWABLE_TABLET | ORAL | Status: AC
  Administered 2014-12-04: 324 mg via ORAL
  Filled 2014-12-04: qty 4

## 2014-12-04 MED ORDER — ACETAMINOPHEN 325 MG PO TABS
ORAL_TABLET | ORAL | Status: AC
  Filled 2014-12-04: qty 2

## 2014-12-04 MED ORDER — ACETAMINOPHEN 325 MG PO TABS
650.0000 mg | ORAL_TABLET | ORAL | Status: AC
  Administered 2014-12-04: 650 mg via ORAL

## 2014-12-04 MED ORDER — ASPIRIN 81 MG PO CHEW
324.0000 mg | CHEWABLE_TABLET | Freq: Once | ORAL | Status: AC
  Administered 2014-12-04: 324 mg via ORAL

## 2014-12-04 NOTE — ED Notes (Signed)
MD at bedside. 

## 2014-12-04 NOTE — Telephone Encounter (Signed)
Patient Name: Leah Holmes  DOB: August 22, 1926    Initial Comment Caller states has HX of A-Fib, Diabetic, high BP, yesterday and today, feels awful, shaking and can't quit. BS and BP are fine. would wake up in a cold sweat. having discomfort in chest and headache the last 2 days    Nurse Assessment  Nurse: Raphael Gibney, RN, Vanita Ingles Date/Time Eilene Ghazi Time): 12/04/2014 1:31:19 PM  Confirm and document reason for call. If symptomatic, describe symptoms. ---Caller states she is trembling all over. History of diabetes and HTN. Last time she kept waking in a cold sweat. Has been having chest discomfort and under her ribs. BP 192/81 and pulse 80 Blood sugar 136. Has headache yesterday and today.  Has the patient traveled out of the country within the last 30 days? ---No  Does the patient require triage? ---Yes  Related visit to physician within the last 2 weeks? ---No  Does the PT have any chronic conditions? (i.e. diabetes, asthma, etc.) ---Yes  List chronic conditions. ---Atrial fib ; diabetes and HTN     Guidelines    Guideline Title Affirmed Question Affirmed Notes  Chest Pain [1] Chest pain lasts > 5 minutes AND [2] history of heart disease (i.e., heart attack, bypass surgery, angina, angioplasty, CHF; not just a heart murmur)    Final Disposition User   Call EMS [facility] Raphael Gibney, RN, Vera    Comments  Caller states she does not want to call 911 as she has been to the ER many times and wants to see her doctor. Called back line at office and spoke to Jaconita and gave report.

## 2014-12-04 NOTE — ED Provider Notes (Signed)
[facility] Emergency Department Provider Note  ____________________________________________  Time seen: Approximately 5:46 PM  I have reviewed the triage vital signs and the nursing notes.   HISTORY  Chief Complaint Chest Pain; Headache; Tremors; and Hypertension    HPI Leah Holmes is a 81 y.o. female with a history of coronary artery disease, hypertension, diabetes, hyperlipidemia, and atrial fibrillation currently on warfarinwho presents with some chest tightness and diaphoresis that occurred early in the day yesterday but was worse during the night last night.  The symptoms are constant but waxes and wane from mild to severe.  She feels that she has been generally weak but with no focal weakness in any one of her extremities.  She describes the chest tightness as a swelling and a fullness that goes from the center of her chest and occasionally radiates up into her neck.  It does not radiate down her arms.  She does not feel short of breath.  She has not had any nausea or vomiting.  She also complains of "tremors "that are worse in her hands than usual but she also feels some tremors of her chest . When it occurred she tried taking nitroglycerin but it did not help her feel better.  She called her regular doctor who advised that she come to the emergency department.   Past Medical History  Diagnosis Date  . Diabetes mellitus without complication   . Ulcer   . Hyperlipidemia   . Hypertension   . Polyp of colon   . Atrial fibrillation 2012  . GERD (gastroesophageal reflux disease)   . Irritable bowel syndrome   . Colon polyp 2003  . CHF (congestive heart failure)   . Coronary artery disease     Patient Active Problem List   Diagnosis Date Noted  . Viral URI with cough 08/25/2014  . Atrial fibrillation 08/16/2014  . Medicare annual wellness visit, subsequent 05/23/2014  . Dry mouth 03/10/2014  . Low serum vitamin D 02/16/2014  . Rectal bleeding  09/20/2013  . Dysphagia, unspecified(787.20) 09/20/2013  . Anal polyp 09/20/2013  . Personal history of colonic polyps 08/31/2013  . CAD (coronary artery disease) 03/09/2013  . Diabetes mellitus without complication 36/64/4034  . Hyperlipidemia associated with type 2 diabetes mellitus 03/09/2013  . Essential hypertension, benign 03/09/2013  . Nonulcer dyspepsia 03/09/2013    Past Surgical History  Procedure Laterality Date  . Appendectomy    . Breast surgery    . Abdominal hysterectomy    . Cholecystectomy  03-2012  . Colonoscopy  2003    Dr Theodosia Paling in [place]  . Upper gi endoscopy  2003    Dr Theodosia Paling in [place]  . Fibroid tumor    . Abdominal adhesion surgery      Current Outpatient Rx  Name  Route  Sig  Dispense  Refill  . aspirin EC 81 MG tablet   Oral   Take 81 mg by mouth daily.         Marland Kitchen atenolol (TENORMIN) 50 MG tablet   Oral   Take 1 tablet (50 mg total) by mouth 2 (two) times daily.   180 tablet   1   . glipiZIDE (GLUCOTROL XL) 2.5 MG 24 hr tablet   Oral   Take 1 tablet (2.5 mg total) by mouth daily with breakfast. Patient taking differently: Take 2.5 mg by mouth 2 (two) times daily.    30 tablet   5   . glucose blood (ONE TOUCH ULTRA TEST) test  strip      Check sugar bid Dx E11.9 Patient taking differently: 1 each by Other route 2 (two) times daily.    100 each   5   . hydrochlorothiazide (HYDRODIURIL) 25 MG tablet   Oral   Take 25 mg by mouth every other day.          . isosorbide mononitrate (IMDUR) 60 MG 24 hr tablet   Oral   Take 60 mg by mouth 2 (two) times daily.          Marland Kitchen lisinopril (PRINIVIL,ZESTRIL) 5 MG tablet   Oral   Take 1 tablet (5 mg total) by mouth daily.   90 tablet   3   . meclizine (ANTIVERT) 25 MG tablet   Oral   Take 25 mg by mouth 3 (three) times daily as needed for dizziness.         . nitroGLYCERIN (NITROSTAT) 0.4 MG SL tablet   Sublingual   Place 0.4 mg under the tongue every 5 (five) minutes as needed  for chest pain.         Marland Kitchen omeprazole (PRILOSEC) 20 MG capsule   Oral   Take 1 capsule (20 mg total) by mouth daily.   30 capsule   5   . ONETOUCH DELICA LANCETS 56O MISC      Check sugar bid Dx E11.9 Patient taking differently: 1 each by Other route 2 (two) times daily.    100 each   5   . psyllium (METAMUCIL) 58.6 % packet   Oral   Take 1 packet by mouth daily.         . simvastatin (ZOCOR) 20 MG tablet   Oral   Take 20 mg by mouth every other day.         . warfarin (COUMADIN) 2 MG tablet   Oral   Take 2-4 mg by mouth daily. Pt takes one tablet on Sunday, Tuesday, and Thursday. Pt takes two tablets on Monday, Wednesday, Friday, and Saturday.         . ergocalciferol (DRISDOL) 50000 UNITS capsule   Oral   Take 1 capsule (50,000 Units total) by mouth once a week. Patient not taking: Reported on 12/04/2014   12 capsule   0   . ergocalciferol (DRISDOL) 50000 UNITS capsule   Oral   Take 1 capsule (50,000 Units total) by mouth once a week. Patient not taking: Reported on 12/04/2014   12 capsule   0   . meclizine (ANTIVERT) 25 MG tablet      TAKE ONE TABLET BY MOUTH THREE TIMES DAILY AS NEEDED Patient not taking: Reported on 12/04/2014   30 tablet   5   . nystatin (MYCOSTATIN) 100000 UNIT/ML suspension   Oral   Take 5 mLs (500,000 Units total) by mouth 4 (four) times daily. Patient not taking: Reported on 12/04/2014   100 mL   0     Allergies Codeine; Penicillins; Clonidine derivatives; Gabapentin; Losartan; Morphine and related; Reglan; and Hydralazine  Family History  Problem Relation Age of Onset  . Cancer Sister 86    breast    Social History History  Substance Use Topics  . Smoking status: Never Smoker   . Smokeless tobacco: Current User    Types: Snuff     Comment: occasionally  . Alcohol Use: No    Review of Systems Constitutional: No fever/chills Eyes: No visual changes. ENT: No sore throat. Cardiovascular: Chest tightness as  described above Respiratory: Denies shortness  of breath. Gastrointestinal: No abdominal pain.  No nausea, no vomiting.  No diarrhea.  No constipation. Genitourinary: Negative for dysuria. Musculoskeletal: Negative for back pain. Skin: Negative for rash. Neurological: Negative for headaches, focal weakness or numbness.  10-point ROS otherwise negative.  ____________________________________________   PHYSICAL EXAM:  VITAL SIGNS: ED Triage Vitals  Enc Vitals Group     BP 12/04/14 1558 164/78 mmHg     Pulse Rate 12/04/14 1558 73     Resp 12/04/14 1558 18     Temp 12/04/14 1558 97.7 F (36.5 C)     Temp Source 12/04/14 1558 Oral     SpO2 12/04/14 1558 96 %     Weight 12/04/14 1558 142 lb (64.411 kg)     Height 12/04/14 1558 5\' 6"  (1.676 m)     Head Cir --      Peak Flow --      Pain Score 12/04/14 1559 7     Pain Loc --      Pain Edu? --      Excl. in [place]? --     Constitutional: Alert and oriented. Well appearing and in no acute distress. Eyes: Conjunctivae are normal. PERRL. EOMI. Head: Atraumatic. Nose: No congestion/rhinnorhea. Mouth/Throat: Mucous membranes are moist.  Oropharynx non-erythematous. Neck: No stridor.   Cardiovascular: Normal rate, regular rhythm. Grossly normal heart sounds.  Good peripheral circulation. Respiratory: Normal respiratory effort.  No retractions. Lungs CTAB. Gastrointestinal: Soft and nontender. No distention. No abdominal bruits. No CVA tenderness. Musculoskeletal: No lower extremity tenderness nor edema.  No joint effusions. Neurologic:  Normal speech and language. No gross focal neurologic deficits are appreciated. Speech is normal.  Mild tremor in her hands which is apparently at baseline according to her daughter Skin:  Skin is warm, dry and intact. No rash noted.   ____________________________________________   LABS (all labs ordered are listed, but only abnormal results are displayed)  Labs Reviewed  COMPREHENSIVE METABOLIC  PANEL - Abnormal; Notable for the following:    Sodium 133 (*)    Glucose, Bld 147 (*)    All other components within normal limits  CBC  TROPONIN I  TROPONIN I  PROTIME-INR   ____________________________________________  EKG  ED ECG REPORT I, Veryl Abril, the attending physician, personally viewed and interpreted this ECG.   Date: 12/04/2014  EKG Time: 16:04  Rate: 73  Rhythm: atrial fibrillation, rate 73  Axis: Left axis deviation  Intervals:left bundle branch block  ST&T Change: Non-specific ST segment / T-wave changes, but no evidence of acute ischemia. No significant change since ECG in 01/2013  ____________________________________________  RADIOLOGY  Dg Chest 2 View  12/04/2014   CLINICAL DATA:  Chest discomfort and fluttering for 2 days.  EXAM: CHEST  2 VIEW  COMPARISON:  10/05/2014  FINDINGS: Chronic hyperinflation, likely COPD. There is no edema, consolidation, effusion, or pneumothorax. Calcified granuloma noted at the right apex.  Normal heart size and mediastinal contours.  No acute osseous findings.  IMPRESSION: No active cardiopulmonary disease.   Electronically Signed   By: Monte Fantasia M.D.   On: 12/04/2014 16:50    ____________________________________________   PROCEDURES  Procedure(s) performed: None  Critical Care performed: No ____________________________________________   INITIAL IMPRESSION / ASSESSMENT AND PLAN / ED COURSE  Pertinent labs & imaging results that were available during my care of the patient were reviewed by me and considered in my medical decision making (see chart for details).  Upon talking with her daughter and the patient about  her symptoms, I learned that about 3-4 years ago the patient had a catheterization by Dr. [name] and was referred to a cardiothoracic surgeon at [facility] due to extensive diffuse coronary artery disease.  The patient's daughter reports that the surgeons felt that there was nothing intervene a bowl at  the time due to the diffuse nature of the disease.  She is followed by Dr. [name] for medical management of her CAD.  Given that we know she has apparently at least moderate if not severe CAD that is being medically managed, I suspect her symptoms do represent ischemic chest pain.  However her initial EKG is reassuring and her initial troponin is negative.  I discussed with the patient and her daughter the disposition options of either admission for chest pain observation and further evaluation or checking a second troponin and, if negative, discharge with close follow-up with Dr. [name].  The patient strongly prefers discharge if possible, so we will check a second troponin and reassess.  I will also check an INR since the patient does take warfarin.  The patient is hungry and I will have her eat and drink in the emergency department while awaiting her results.  ----------------------------------------- 8:47 PM on 12/04/2014 -----------------------------------------  The patient feels better and is ready to go home.  Her second troponin is negative.  I again reviewed with her and her daughter the probability of ischemic chest pain but we all agree that outpatient follow-up is appropriate for her.  She was given a full dose aspirin in the emergency department.  I gave her my usual and customary return precautions.  She is hypertensive at the time of discharge but she has not had her evening medications and prefers to take them at home.  ____________________________________________  FINAL CLINICAL IMPRESSION(S) / ED DIAGNOSES  Final diagnoses:  Ischemic chest pain      NEW MEDICATIONS STARTED DURING THIS VISIT:  New Prescriptions   No medications on file     Hinda Kehr, MD 12/04/14 2048

## 2014-12-04 NOTE — ED Notes (Signed)
Pt reports yesterday started with some chest discomfort and today the discomfort is worse. Pt reports took nitro with no relief. Pt also reports HA, high blood pressure and tremors. Pt called PMD and was advised to come to the ED. Pt c/o SOB with the chest discomfort.

## 2014-12-04 NOTE — Discharge Instructions (Signed)
You have been seen in the Emergency Department (ED) today for chest pain.  As we have discussed todays test results are reassuring, but you may require further testing.  Please follow up with the recommended doctor as instructed above in these documents regarding todays emergent visit and your recent symptoms to discuss further management.  Continue to take your regular medications. If you are not doing so already, please also take a daily baby aspirin (81 mg), at least until you follow up with your doctor.  Return to the Emergency Department (ED) if you experience any further chest pain/pressure/tightness, difficulty breathing, or sudden sweating, or other symptoms that concern you.   Angina Pectoris Angina pectoris, often just called angina, is extreme discomfort in your chest, neck, or arm caused by a lack of blood in the middle and thickest layer of your heart wall (myocardium). It may feel like tightness or heavy pressure. It may feel like a crushing or squeezing pain. Some people say it feels like gas or indigestion. It may go down your shoulders, back, and arms. Some people may have symptoms other than pain. These symptoms include fatigue, shortness of breath, cold sweats, or nausea. There are four different types of angina:  Stable angina--Stable angina usually occurs in episodes of predictable frequency and duration. It usually is brought on by physical activity, emotional stress, or excitement. These are all times when the myocardium needs more oxygen. Stable angina usually lasts a few minutes and often is relieved by taking a medicine that can be taken under your tongue (sublingually). The medicine is called nitroglycerin. Stable angina is caused by a buildup of plaque inside the arteries, which restricts blood flow to the heart muscle (atherosclerosis).  Unstable angina--Unstable angina can occur even when your body experiences little or no physical exertion. It can occur during sleep. It  can also occur at rest. It can suddenly increase in severity or frequency. It might not be relieved by sublingual nitroglycerin. It can last up to 30 minutes. The most common cause of unstable angina is a blood clot that has developed on the top of plaque buildup inside a coronary artery. It can lead to a heart attack if the blood clot completely blocks the artery.  Microvascular angina--This type of angina is caused by a disorder of tiny blood vessels called arterioles. Microvascular angina is more common in women. The pain may be more severe and last longer than other types of angina pectoris.  Prinzmetal or variant angina--This type of angina pectoris usually occurs when your body experiences little or no physical exertion. It especially occurs in the early morning hours. It is caused by a spasm of your coronary artery. HOME CARE INSTRUCTIONS   Only take over-the-counter and prescription medicines as directed by your health care provider.  Stay active or increase your exercise as directed by your health care provider.  Limit strenuous activity as directed by your health care provider.  Limit heavy lifting as directed by your health care provider.  Maintain a healthy weight.  Learn about and eat heart-healthy foods.  Do not use any tobacco products including cigarettes, chewing tobacco or electronic cigarettes. SEEK IMMEDIATE MEDICAL CARE IF:  You experience the following symptoms:  Chest, neck, deep shoulder, or arm pain or discomfort that lasts more than a few minutes.  Chest, neck, deep shoulder, or arm pain or discomfort that goes away and comes back, repeatedly.  Heavy sweating with discomfort, without a noticeable cause.  Shortness of breath or difficulty  breathing.  Angina that does not get better after a few minutes of rest or after taking sublingual nitroglycerin. These can all be symptoms of a heart attack, which is a medical emergency! Get medical help at once. Call your  local emergency service (911 in [place] immediately. Do not  drive yourself to the hospital and do not  wait to for your symptoms to go away. MAKE SURE YOU:  Understand these instructions.  Will watch your condition.  Will get help right away if you are not doing well or get worse. Document Released: 06/01/2005 Document Revised: 06/06/2013 Document Reviewed: 10/03/2013 [facility] Patient Information 2015 [place], [place]. This information is not intended to replace advice given to you by your health care provider. Make sure you discuss any questions you have with your health care provider.

## 2014-12-04 NOTE — Telephone Encounter (Signed)
FYI

## 2014-12-04 NOTE — Telephone Encounter (Signed)
Patient stated she is having sharp pains her head, stated she is shaking and break out in sweat advised patient no appointments available and that she should follow care advice patient stated she will go to ER.

## 2014-12-04 NOTE — ED Notes (Signed)
Pt describes chest tightness that began last night, accompanied with diaphoresis. Pt also reports feeling weak, high blood pressure and palpitations. Hx of afib

## 2014-12-04 NOTE — ED Notes (Signed)
Pt c/o mild headache, orders received for tylenol PO and to give patient food.

## 2014-12-05 ENCOUNTER — Other Ambulatory Visit: Payer: Self-pay | Admitting: Internal Medicine

## 2014-12-12 DIAGNOSIS — I482 Chronic atrial fibrillation: Secondary | ICD-10-CM | POA: Diagnosis not present

## 2014-12-12 DIAGNOSIS — I25119 Atherosclerotic heart disease of native coronary artery with unspecified angina pectoris: Secondary | ICD-10-CM | POA: Diagnosis not present

## 2014-12-12 DIAGNOSIS — I10 Essential (primary) hypertension: Secondary | ICD-10-CM | POA: Diagnosis not present

## 2014-12-12 DIAGNOSIS — I209 Angina pectoris, unspecified: Secondary | ICD-10-CM | POA: Diagnosis not present

## 2015-01-03 DIAGNOSIS — I480 Paroxysmal atrial fibrillation: Secondary | ICD-10-CM | POA: Diagnosis not present

## 2015-01-07 ENCOUNTER — Ambulatory Visit (INDEPENDENT_AMBULATORY_CARE_PROVIDER_SITE_OTHER): Payer: Medicare Other | Admitting: Internal Medicine

## 2015-01-07 ENCOUNTER — Encounter: Payer: Self-pay | Admitting: Internal Medicine

## 2015-01-07 VITALS — BP 144/70 | HR 61 | Temp 97.8°F | Resp 14 | Ht 66.0 in | Wt 144.8 lb

## 2015-01-07 DIAGNOSIS — I209 Angina pectoris, unspecified: Secondary | ICD-10-CM

## 2015-01-07 DIAGNOSIS — I10 Essential (primary) hypertension: Secondary | ICD-10-CM

## 2015-01-07 DIAGNOSIS — E119 Type 2 diabetes mellitus without complications: Secondary | ICD-10-CM

## 2015-01-07 DIAGNOSIS — K30 Functional dyspepsia: Secondary | ICD-10-CM | POA: Diagnosis not present

## 2015-01-07 NOTE — Patient Instructions (Addendum)
I am increasing your lisinopril to 10  Mg  Daily ,    Continue your Imdur  Two times daily and your atenolol twice daily   You can take benadryl for itching AS NEEDED   You have dry mouth. Not thrush.  Try sleeping with your mouth closed   Reduce your dose of glipizide to 1/2 tablet  before dinner ONLY   RETURN FOR LABS IN LATE September,  [place] IN October  Or January depending on the labs    You do not have to check your sugar more than once daily

## 2015-01-07 NOTE — Progress Notes (Signed)
Subjective:  Patient ID: Leah Holmes, female    DOB: [birth year]  Age: 81 y.o. MRN: [id number]  CC: The primary encounter diagnosis was Essential hypertension, benign. Diagnoses of Diabetes mellitus without complication, Nonulcer dyspepsia, and Ischemic chest pain were also pertinent to this visit.  HPI Leah Holmes presents for follow up on a recent ER visit  In late June for chest pain ,  Atrial fib,  uncontrolled HTN, and Type 2 DM.  At one week follow up with [name], valsartan was added but she did not tolerate it due to stomach ache  ,  Stopped it after a week,  No stool change.   Checks home BPs which have been labile up to 190.., took BP reading bc she had a bad headache.   Has headaches 4 or 5 times per week,  Uses tylenol , sometimes associated with sneezing and sinus congestion.   She had an episode of intense itching from scalp , to bottoms of feet, no rash   Two episodes of low blood sugars  Outpatient Prescriptions Prior to Visit  Medication Sig Dispense Refill  . aspirin EC 81 MG tablet Take 81 mg by mouth daily.    Marland Kitchen atenolol (TENORMIN) 50 MG tablet TAKE ONE TABLET BY MOUTH TWICE DAILY 180 tablet 0  . ergocalciferol (DRISDOL) 50000 UNITS capsule Take 1 capsule (50,000 Units total) by mouth once a week. 12 capsule 0  . ergocalciferol (DRISDOL) 50000 UNITS capsule Take 1 capsule (50,000 Units total) by mouth once a week. 12 capsule 0  . glucose blood (ONE TOUCH ULTRA TEST) test strip Check sugar bid Dx E11.9 (Patient taking differently: 1 each by Other route 2 (two) times daily. ) 100 each 5  . hydrochlorothiazide (HYDRODIURIL) 25 MG tablet Take 25 mg by mouth every other day.     . isosorbide mononitrate (IMDUR) 60 MG 24 hr tablet Take 60 mg by mouth 2 (two) times daily.     Marland Kitchen lisinopril (PRINIVIL,ZESTRIL) 5 MG tablet Take 1 tablet (5 mg total) by mouth daily. 90 tablet 3  . meclizine (ANTIVERT) 25 MG tablet TAKE ONE TABLET BY MOUTH THREE TIMES DAILY AS NEEDED 30 tablet 5   . meclizine (ANTIVERT) 25 MG tablet Take 25 mg by mouth 3 (three) times daily as needed for dizziness.    . nitroGLYCERIN (NITROSTAT) 0.4 MG SL tablet Place 0.4 mg under the tongue every 5 (five) minutes as needed for chest pain.    Marland Kitchen omeprazole (PRILOSEC) 20 MG capsule Take 1 capsule (20 mg total) by mouth daily. 30 capsule 5  . ONETOUCH DELICA LANCETS 32I MISC Check sugar bid Dx E11.9 (Patient taking differently: 1 each by Other route 2 (two) times daily. ) 100 each 5  . psyllium (METAMUCIL) 58.6 % packet Take 1 packet by mouth daily.    . simvastatin (ZOCOR) 20 MG tablet Take 20 mg by mouth every other day.    . warfarin (COUMADIN) 2 MG tablet Take 2-4 mg by mouth daily. Pt takes one tablet on Sunday, Tuesday, and Thursday. Pt takes two tablets on Monday, Wednesday, Friday, and Saturday.    . nystatin (MYCOSTATIN) 100000 UNIT/ML suspension Take 5 mLs (500,000 Units total) by mouth 4 (four) times daily. (Patient not taking: Reported on 01/07/2015) 100 mL 0  . glipiZIDE (GLUCOTROL XL) 2.5 MG 24 hr tablet Take 1 tablet (2.5 mg total) by mouth daily with breakfast. (Patient taking differently: Take 2.5 mg by mouth 2 (two) times daily. ) 30 tablet 5  No facility-administered medications prior to visit.    Review of Systems;  Patient denies headache, fevers, malaise, unintentional weight loss, skin rash, eye pain, sinus congestion and sinus pain, sore throat, dysphagia,  hemoptysis , cough, dyspnea, wheezing, chest pain, palpitations, orthopnea, edema, abdominal pain, nausea, melena, diarrhea, constipation, flank pain, dysuria, hematuria, urinary  Frequency, nocturia, numbness, tingling, seizures,  Focal weakness, Loss of consciousness,  Tremor, insomnia, depression, anxiety, and suicidal ideation.      Objective:  BP 144/70 mmHg  Pulse 61  Temp(Src) 97.8 F (36.6 C) (Oral)  Resp 14  Ht 5\' 6"  (1.676 m)  Wt 144 lb 12 oz (65.658 kg)  BMI 23.37 kg/m2  SpO2 97%  BP Readings from Last 3  Encounters:  01/07/15 144/70  12/04/14 189/89  08/22/14 134/64    Wt Readings from Last 3 Encounters:  01/07/15 144 lb 12 oz (65.658 kg)  12/04/14 142 lb (64.411 kg)  08/22/14 148 lb (67.132 kg)    General appearance: alert, cooperative and appears stated age Ears: normal TM's and external ear canals both ears Throat: lips, mucosa, and tongue normal; teeth and gums normal Neck: no adenopathy, no carotid bruit, supple, symmetrical, trachea midline and thyroid not enlarged, symmetric, no tenderness/mass/nodules Back: symmetric, no curvature. ROM normal. No CVA tenderness. Lungs: clear to auscultation bilaterally Heart: regular rate and rhythm, S1, S2 normal, no murmur, click, rub or gallop Abdomen: soft, non-tender; bowel sounds normal; no masses,  no organomegaly Pulses: 2+ and symmetric Skin: Skin color, texture, turgor normal. No rashes or lesions Lymph nodes: Cervical, supraclavicular, and axillary nodes normal.  Lab Results  Component Value Date   HGBA1C 6.3 08/22/2014   HGBA1C 6.3 05/22/2014   HGBA1C 6.3 02/15/2014    Lab Results  Component Value Date   CREATININE 0.66 12/04/2014   CREATININE 0.69 10/05/2014   CREATININE 0.68 08/22/2014    Lab Results  Component Value Date   WBC 6.2 12/04/2014   HGB 14.9 12/04/2014   HCT 45.0 12/04/2014   PLT 201 12/04/2014   GLUCOSE 147* 12/04/2014   CHOL 145 02/15/2014   TRIG 57.0 02/15/2014   HDL 57.30 02/15/2014   LDLDIRECT 114.0 08/22/2014   LDLCALC 76 02/15/2014   ALT 19 12/04/2014   AST 32 12/04/2014   NA 133* 12/04/2014   K 3.9 12/04/2014   CL 101 12/04/2014   CREATININE 0.66 12/04/2014   BUN 13 12/04/2014   CO2 27 12/04/2014   TSH 0.62 02/15/2014   INR 2.26 12/04/2014   HGBA1C 6.3 08/22/2014   MICROALBUR 0.6 05/22/2014    Dg Chest 2 View  12/04/2014   CLINICAL DATA:  Chest discomfort and fluttering for 2 days.  EXAM: CHEST  2 VIEW  COMPARISON:  10/05/2014  FINDINGS: Chronic hyperinflation, likely COPD.  There is no edema, consolidation, effusion, or pneumothorax. Calcified granuloma noted at the right apex.  Normal heart size and mediastinal contours.  No acute osseous findings.  IMPRESSION: No active cardiopulmonary disease.   Electronically Signed   By: Monte Fantasia M.D.   On: 12/04/2014 16:50    Assessment & Plan:   Problem List Items Addressed This Visit      Unprioritized   Diabetes mellitus without complication    Historically well-controlled on current medications.  hemoglobin A1c has been consistently at or  less than 7.0 . Patient is up-to-date on eye exams and foot exam is normal today. Patient has no microalbuminuria. .  Medication reduced  to glipizide 2.5 mg before dinner.  Lab Results  Component Value Date   HGBA1C 6.3 08/22/2014         Relevant Medications   glipiZIDE (GLUCOTROL) 5 MG tablet   Essential hypertension, benign - Primary    She has not tolerated valsartan.  Will increase lisinopril to 10 mg daily , continue Imdur and atenolol .       Nonulcer dyspepsia    Worsening on 20 mg prilosec.  PA for nexium initiated,        Ischemic chest pain    troponins were negative and she has follow up with Dr Erin Fulling,          I am having Ms. Mohammad maintain her hydrochlorothiazide, isosorbide mononitrate, nitroGLYCERIN, ergocalciferol, nystatin, lisinopril, ONETOUCH DELICA LANCETS 00Q, glucose blood, omeprazole, ergocalciferol, meclizine, meclizine, aspirin EC, simvastatin, warfarin, psyllium, atenolol, and glipiZIDE.  Meds ordered this encounter  Medications  . glipiZIDE (GLUCOTROL) 5 MG tablet    Sig: Take 2.5 mg by mouth 2 (two) times daily.    Medications Discontinued During This Encounter  Medication Reason  . glipiZIDE (GLUCOTROL XL) 2.5 MG 24 hr tablet Change in therapy    Follow-up: Return for follow up diabetes.   Crecencio Mc, MD

## 2015-01-07 NOTE — Progress Notes (Signed)
Pre-visit discussion using our clinic review tool. No additional management support is needed unless otherwise documented below in the visit note.  

## 2015-01-08 DIAGNOSIS — I209 Angina pectoris, unspecified: Secondary | ICD-10-CM | POA: Insufficient documentation

## 2015-01-08 NOTE — Assessment & Plan Note (Signed)
troponins were negative and she has follow up with Dr Erin Fulling,

## 2015-01-08 NOTE — Assessment & Plan Note (Signed)
Worsening on 20 mg prilosec.  PA for nexium initiated,

## 2015-01-08 NOTE — Assessment & Plan Note (Signed)
She has not tolerated valsartan.  Will increase lisinopril to 10 mg daily , continue Imdur and atenolol .

## 2015-01-08 NOTE — Assessment & Plan Note (Signed)
Historically well-controlled on current medications.  hemoglobin A1c has been consistently at or  less than 7.0 . Patient is up-to-date on eye exams and foot exam is normal today. Patient has no microalbuminuria. .  Medication reduced  to glipizide 2.5 mg before dinner.   Lab Results  Component Value Date   HGBA1C 6.3 08/22/2014

## 2015-01-31 DIAGNOSIS — I480 Paroxysmal atrial fibrillation: Secondary | ICD-10-CM | POA: Diagnosis not present

## 2015-02-19 ENCOUNTER — Other Ambulatory Visit: Payer: Self-pay | Admitting: Internal Medicine

## 2015-02-20 ENCOUNTER — Other Ambulatory Visit: Payer: Self-pay | Admitting: *Deleted

## 2015-02-20 MED ORDER — ONETOUCH DELICA LANCETS 33G MISC: Status: DC

## 2015-02-21 ENCOUNTER — Other Ambulatory Visit: Payer: Self-pay | Admitting: Internal Medicine

## 2015-02-21 ENCOUNTER — Other Ambulatory Visit: Payer: Self-pay | Admitting: *Deleted

## 2015-02-21 MED ORDER — ONETOUCH DELICA LANCETS 33G MISC: Status: DC

## 2015-02-22 ENCOUNTER — Other Ambulatory Visit: Payer: Self-pay | Admitting: *Deleted

## 2015-02-22 MED ORDER — GLUCOSE BLOOD VI STRP: ORAL_STRIP | Status: DC

## 2015-02-25 ENCOUNTER — Ambulatory Visit (INDEPENDENT_AMBULATORY_CARE_PROVIDER_SITE_OTHER): Payer: Medicare Other | Admitting: Internal Medicine

## 2015-02-25 ENCOUNTER — Encounter: Payer: Self-pay | Admitting: Internal Medicine

## 2015-02-25 VITALS — BP 140/70 | HR 52 | Temp 98.0°F | Resp 12 | Ht 66.0 in | Wt 144.8 lb

## 2015-02-25 DIAGNOSIS — I10 Essential (primary) hypertension: Secondary | ICD-10-CM | POA: Diagnosis not present

## 2015-02-25 DIAGNOSIS — E119 Type 2 diabetes mellitus without complications: Secondary | ICD-10-CM

## 2015-02-25 NOTE — Progress Notes (Signed)
Pre-visit discussion using our clinic review tool. No additional management support is needed unless otherwise documented below in the visit note.  

## 2015-02-25 NOTE — Patient Instructions (Addendum)
You can stop checking your blood sugar unless it feels low   continue glipizide 2.5 glipizide before dinner.   Do not skip dinner!  I am increasing your lisinopril to 20 mg daily  In the morning ;   Take a second dose  In the early evening if your BP is still > 160/90   We will give you your flu vaccine when you return for fasting blood work on Sept 28

## 2015-02-25 NOTE — Progress Notes (Signed)
Subjective:  Patient ID: Leah Holmes, female    DOB: 1926-11-11  Age: 81 y.o. MRN: [id number]  CC: The primary encounter diagnosis was Diabetes mellitus without complication. A diagnosis of Essential hypertension, benign was also pertinent to this visit.  HPI Leah Holmes presents for uncontrolled hypertension and elevated BS.  Patient is accompanied by her daughter and brings  A log of her Blood sugars and blood pressures that she is checking one to two times daily  Her blood pressures have been elevated to 170/90 at times  and  BS have been <150 .  She has been taking 10  Mg lisinopril  Outpatient Prescriptions Prior to Visit  Medication Sig Dispense Refill  . aspirin EC 81 MG tablet Take 81 mg by mouth daily.    Marland Kitchen glipiZIDE (GLUCOTROL) 5 MG tablet Take 2.5 mg by mouth daily before breakfast.     . glucose blood (ONE TOUCH ULTRA TEST) test strip Check sugar bid Dx E11.9 100 each 5  . hydrochlorothiazide (HYDRODIURIL) 25 MG tablet Take 25 mg by mouth every other day.     . isosorbide mononitrate (IMDUR) 60 MG 24 hr tablet Take 60 mg by mouth 2 (two) times daily.     . meclizine (ANTIVERT) 25 MG tablet TAKE ONE TABLET BY MOUTH THREE TIMES DAILY AS NEEDED 30 tablet 5  . nitroGLYCERIN (NITROSTAT) 0.4 MG SL tablet Place 0.4 mg under the tongue every 5 (five) minutes as needed for chest pain.    Marland Kitchen omeprazole (PRILOSEC) 20 MG capsule Take 1 capsule (20 mg total) by mouth daily. 30 capsule 5  . ONETOUCH DELICA LANCETS 76E MISC USE ONE LANCET TO CHECK BLOOD SUGAR TWICE DAILY  Dx Code E11.9 100 each 0  . psyllium (METAMUCIL) 58.6 % packet Take 1 packet by mouth daily.    . simvastatin (ZOCOR) 20 MG tablet Take 20 mg by mouth every other day.    . warfarin (COUMADIN) 2 MG tablet Take 2-4 mg by mouth daily. Pt takes one tablet on Sunday, Tuesday, and Thursday. Pt takes two tablets on Monday, Wednesday, Friday, and Saturday.    Marland Kitchen atenolol (TENORMIN) 50 MG tablet TAKE ONE TABLET BY MOUTH TWICE DAILY  180 tablet 0  . lisinopril (PRINIVIL,ZESTRIL) 5 MG tablet Take 1 tablet (5 mg total) by mouth daily. (Patient taking differently: Take 10 mg by mouth daily. ) 90 tablet 3  . meclizine (ANTIVERT) 25 MG tablet Take 25 mg by mouth 3 (three) times daily as needed for dizziness.    . nystatin (MYCOSTATIN) 100000 UNIT/ML suspension Take 5 mLs (500,000 Units total) by mouth 4 (four) times daily. (Patient not taking: Reported on 01/07/2015) 100 mL 0  . ergocalciferol (DRISDOL) 50000 UNITS capsule Take 1 capsule (50,000 Units total) by mouth once a week. 12 capsule 0  . ergocalciferol (DRISDOL) 50000 UNITS capsule Take 1 capsule (50,000 Units total) by mouth once a week. 12 capsule 0   No facility-administered medications prior to visit.    Review of Systems;  Patient denies headache, fevers, malaise, unintentional weight loss, skin rash, eye pain, sinus congestion and sinus pain, sore throat, dysphagia,  hemoptysis , cough, dyspnea, wheezing, chest pain, palpitations, orthopnea, edema, abdominal pain, nausea, melena, diarrhea, constipation, flank pain, dysuria, hematuria, urinary  Frequency, nocturia, numbness, tingling, seizures,  Focal weakness, Loss of consciousness,  Tremor, insomnia, depression, anxiety, and suicidal ideation.      Objective:  BP 140/70 mmHg  Pulse 52  Temp(Src) 98 F (36.7 C) (Oral)  Resp 12  Ht 5\' 6"  (1.676 m)  Wt 144 lb 12 oz (65.658 kg)  BMI 23.37 kg/m2  SpO2 98%  BP Readings from Last 3 Encounters:  02/25/15 140/70  01/07/15 144/70  12/04/14 189/89    Wt Readings from Last 3 Encounters:  02/25/15 144 lb 12 oz (65.658 kg)  01/07/15 144 lb 12 oz (65.658 kg)  12/04/14 142 lb (64.411 kg)    General appearance: alert, cooperative and appears stated age Ears: normal TM's and external ear canals both ears Throat: lips, mucosa, and tongue normal; teeth and gums normal Neck: no adenopathy, no carotid bruit, supple, symmetrical, trachea midline and thyroid not  enlarged, symmetric, no tenderness/mass/nodules Back: symmetric, no curvature. ROM normal. No CVA tenderness. Lungs: clear to auscultation bilaterally Heart: regular rate and rhythm, S1, S2 normal, no murmur, click, rub or gallop Abdomen: soft, non-tender; bowel sounds normal; no masses,  no organomegaly Pulses: 2+ and symmetric Skin: Skin color, texture, turgor normal. No rashes or lesions Lymph nodes: Cervical, supraclavicular, and axillary nodes normal.  Lab Results  Component Value Date   HGBA1C 6.3 08/22/2014   HGBA1C 6.3 05/22/2014   HGBA1C 6.3 02/15/2014    Lab Results  Component Value Date   CREATININE 0.66 12/04/2014   CREATININE 0.69 10/05/2014   CREATININE 0.68 08/22/2014    Lab Results  Component Value Date   WBC 6.2 12/04/2014   HGB 14.9 12/04/2014   HCT 45.0 12/04/2014   PLT 201 12/04/2014   GLUCOSE 147* 12/04/2014   CHOL 145 02/15/2014   TRIG 57.0 02/15/2014   HDL 57.30 02/15/2014   LDLDIRECT 114.0 08/22/2014   LDLCALC 76 02/15/2014   ALT 19 12/04/2014   AST 32 12/04/2014   NA 133* 12/04/2014   K 3.9 12/04/2014   CL 101 12/04/2014   CREATININE 0.66 12/04/2014   BUN 13 12/04/2014   CO2 27 12/04/2014   TSH 0.62 02/15/2014   INR 2.26 12/04/2014   HGBA1C 6.3 08/22/2014   MICROALBUR 0.6 05/22/2014    Dg Chest 2 View  12/04/2014   CLINICAL DATA:  Chest discomfort and fluttering for 2 days.  EXAM: CHEST  2 VIEW  COMPARISON:  10/05/2014  FINDINGS: Chronic hyperinflation, likely COPD. There is no edema, consolidation, effusion, or pneumothorax. Calcified granuloma noted at the right apex.  Normal heart size and mediastinal contours.  No acute osseous findings.  IMPRESSION: No active cardiopulmonary disease.   Electronically Signed   By: Monte Fantasia M.D.   On: 12/04/2014 16:50    Assessment & Plan:   Problem List Items Addressed This Visit      Unprioritized   Diabetes mellitus without complication - Primary    Reassurance that her diabetes is under  good control.  continue glipizide 2.5 mg once daily .  Lab Results  Component Value Date   HGBA1C 6.3 08/22/2014           Essential hypertension, benign    Labile,  increasing lisinopril to 20 mg  Daily in the morning and repeat dose in the evening for SBP > 160      Relevant Medications   amLODipine (NORVASC) 5 MG tablet     A total of 25 minutes of face to face time was spent with patient more than half of which was spent in counselling about the above mentioned conditions  and coordination of care  I have discontinued Ms. Mccomas's ergocalciferol and ergocalciferol. I am also having her maintain her hydrochlorothiazide, isosorbide mononitrate, nitroGLYCERIN, nystatin,  omeprazole, meclizine, aspirin EC, simvastatin, warfarin, psyllium, glipiZIDE, ONETOUCH DELICA LANCETS 49T, glucose blood, Vitamin D3, and amLODipine.  Meds ordered this encounter  Medications  . Cholecalciferol (VITAMIN D3) 1000 UNITS CAPS    Sig: Take 1 capsule by mouth daily.  Marland Kitchen amLODipine (NORVASC) 5 MG tablet    Sig:     Medications Discontinued During This Encounter  Medication Reason  . ergocalciferol (DRISDOL) 50000 UNITS capsule Error  . ergocalciferol (DRISDOL) 50000 UNITS capsule Change in therapy  . meclizine (ANTIVERT) 25 MG tablet Duplicate    Follow-up: No Follow-up on file.   Crecencio Mc, MD

## 2015-02-27 ENCOUNTER — Other Ambulatory Visit: Payer: Self-pay | Admitting: Internal Medicine

## 2015-02-27 NOTE — Assessment & Plan Note (Signed)
Labile,  increasing lisinopril to 20 mg  Daily in the morning and repeat dose in the evening for SBP > 160

## 2015-02-27 NOTE — Assessment & Plan Note (Signed)
Reassurance that her diabetes is under good control.  continue glipizide 2.5 mg once daily .  Lab Results  Component Value Date   HGBA1C 6.3 08/22/2014

## 2015-02-28 DIAGNOSIS — I480 Paroxysmal atrial fibrillation: Secondary | ICD-10-CM | POA: Diagnosis not present

## 2015-03-13 ENCOUNTER — Other Ambulatory Visit (INDEPENDENT_AMBULATORY_CARE_PROVIDER_SITE_OTHER): Payer: Medicare Other

## 2015-03-13 ENCOUNTER — Telehealth: Payer: Self-pay | Admitting: *Deleted

## 2015-03-13 ENCOUNTER — Ambulatory Visit: Payer: Medicare Other

## 2015-03-13 DIAGNOSIS — E1169 Type 2 diabetes mellitus with other specified complication: Secondary | ICD-10-CM

## 2015-03-13 DIAGNOSIS — I10 Essential (primary) hypertension: Secondary | ICD-10-CM

## 2015-03-13 DIAGNOSIS — E119 Type 2 diabetes mellitus without complications: Secondary | ICD-10-CM

## 2015-03-13 DIAGNOSIS — Z23 Encounter for immunization: Secondary | ICD-10-CM | POA: Diagnosis not present

## 2015-03-13 DIAGNOSIS — R7989 Other specified abnormal findings of blood chemistry: Secondary | ICD-10-CM

## 2015-03-13 DIAGNOSIS — E785 Hyperlipidemia, unspecified: Secondary | ICD-10-CM

## 2015-03-13 LAB — COMPREHENSIVE METABOLIC PANEL
ALBUMIN: 3.7 g/dL (ref 3.5–5.2)
ALT: 17 U/L (ref 0–35)
AST: 19 U/L (ref 0–37)
Alkaline Phosphatase: 60 U/L (ref 39–117)
BUN: 10 mg/dL (ref 6–23)
CHLORIDE: 104 meq/L (ref 96–112)
CO2: 31 meq/L (ref 19–32)
CREATININE: 0.75 mg/dL (ref 0.40–1.20)
Calcium: 10 mg/dL (ref 8.4–10.5)
GFR: 77.78 mL/min (ref >60.00)
GLUCOSE: 117 mg/dL — AB (ref 70–99)
Potassium: 4.2 mEq/L (ref 3.5–5.1)
SODIUM: 141 meq/L (ref 135–145)
Total Bilirubin: 0.8 mg/dL (ref 0.2–1.2)
Total Protein: 6.1 g/dL (ref 6.0–8.3)

## 2015-03-13 LAB — LIPID PANEL
Cholesterol: 158 mg/dL (ref 0–200)
HDL: 60 mg/dL (ref >39.00)
LDL CALC: 86 mg/dL (ref 0–99)
NONHDL: 98.24
Total CHOL/HDL Ratio: 3
Triglycerides: 60 mg/dL (ref 0.0–149.0)
VLDL: 12 mg/dL (ref 0.0–40.0)

## 2015-03-13 LAB — LDL CHOLESTEROL, DIRECT: LDL DIRECT: 95 mg/dL

## 2015-03-13 LAB — VITAMIN D 25 HYDROXY (VIT D DEFICIENCY, FRACTURES): VITD: 22.07 ng/mL — ABNORMAL LOW (ref 30.00–100.00)

## 2015-03-13 LAB — HEMOGLOBIN A1C: Hgb A1c MFr Bld: 6.3 % (ref 4.6–6.5)

## 2015-03-13 NOTE — Telephone Encounter (Signed)
Labs and dx?  

## 2015-03-17 MED ORDER — ERGOCALCIFEROL 1.25 MG (50000 UT) PO CAPS: 50000.0000 [IU] | ORAL_CAPSULE | ORAL | Status: DC

## 2015-03-17 NOTE — Addendum Note (Signed)
Addended by: Crecencio Mc on: 03/17/2015 08:27 PM   Modules accepted: Orders

## 2015-03-22 ENCOUNTER — Telehealth: Payer: Self-pay

## 2015-03-22 ENCOUNTER — Other Ambulatory Visit: Payer: Self-pay

## 2015-03-22 MED ORDER — LISINOPRIL 10 MG PO TABS: 10.0000 mg | ORAL_TABLET | Freq: Every day | ORAL | Status: DC

## 2015-03-22 NOTE — Telephone Encounter (Signed)
90 day supply authorized and sent   

## 2015-03-22 NOTE — Telephone Encounter (Signed)
Pt daughter called stating she needed the new dosage of 10 mg  rx for linsinopril sent to the [facility] in [place], [place]

## 2015-03-28 DIAGNOSIS — I480 Paroxysmal atrial fibrillation: Secondary | ICD-10-CM | POA: Diagnosis not present

## 2015-04-01 ENCOUNTER — Ambulatory Visit (INDEPENDENT_AMBULATORY_CARE_PROVIDER_SITE_OTHER): Payer: Medicare Other | Admitting: Internal Medicine

## 2015-04-01 ENCOUNTER — Encounter: Payer: Self-pay | Admitting: Internal Medicine

## 2015-04-01 VITALS — BP 124/62 | HR 63 | Temp 98.0°F | Wt 144.1 lb

## 2015-04-01 DIAGNOSIS — F411 Generalized anxiety disorder: Secondary | ICD-10-CM | POA: Diagnosis not present

## 2015-04-01 DIAGNOSIS — I482 Chronic atrial fibrillation, unspecified: Secondary | ICD-10-CM

## 2015-04-01 DIAGNOSIS — R682 Dry mouth, unspecified: Secondary | ICD-10-CM

## 2015-04-01 DIAGNOSIS — I10 Essential (primary) hypertension: Secondary | ICD-10-CM

## 2015-04-01 MED ORDER — ATENOLOL 25 MG PO TABS: 25.0000 mg | ORAL_TABLET | Freq: Two times a day (BID) | ORAL | Status: DC

## 2015-04-01 MED ORDER — LISINOPRIL 20 MG PO TABS: 20.0000 mg | ORAL_TABLET | Freq: Two times a day (BID) | ORAL | Status: DC

## 2015-04-01 NOTE — Progress Notes (Signed)
Pre visit review using our clinic review tool, if applicable. No additional management support is needed unless otherwise documented below in the visit note. 

## 2015-04-01 NOTE — Assessment & Plan Note (Signed)
Uncontrolled due to medication changes not employed by patient.  increasing lisinopril to 20 mg bid,  Adding amlodipine prn BP > 160/  Continue Imdur twice daily

## 2015-04-01 NOTE — Assessment & Plan Note (Signed)
Rate controlled with atenolol bid,  Reducing dose to 25 mg today due to symptomatic bradycardia.

## 2015-04-01 NOTE — Assessment & Plan Note (Signed)
Still problematic.  Continue Biotene

## 2015-04-01 NOTE — Patient Instructions (Addendum)
Cut the atenolol in half and take 1/2 tablet twice daily  Increase your  lisinopril to 20 mg twice daily   Take the amlodipine 5 mg daily this morning only,   Continue to take it in the evening BP is > 160/90  Continue isosorbide mononitrate two times daily

## 2015-04-01 NOTE — Progress Notes (Signed)
Subjective:  Patient ID: Leah Holmes, female    DOB: [birth year]  Age: 81 y.o. MRN: [id number]  CC: The primary encounter diagnosis was Chronic atrial fibrillation ([place]). Diagnoses of Essential hypertension, benign, Dry mouth, and Generalized anxiety disorder were also pertinent to this visit.  HPI Leah Holmes presents for follow up on uncontrolled hypertension.  She recalls that at her last visit in September her lisinopril was raised to 20 mg daily but because her bottle was refilled at 10 mg ,  She decided to remain at 10 mg.  Daughter states she she continually reports feeling poorly.  Feels wobbly when she gets up.  Daughter has noticed that her irregular pulse has been in the  50's on atenolol 50 mg bid.  "Id ont' feel like I did 20 years ago."   She also received the flu shot and daughter was concerned that it was given too high in the shoulder,  Up near the joint less than 2 fingers from the top.   BP checked by me today was 180/80. irreg heart beat Still checking BS twice daily .  All morning sugars in range.  2 evening sugars at 200, otherwise  Normal.  no lows.   Outpatient Prescriptions Prior to Visit  Medication Sig Dispense Refill  . amLODipine (NORVASC) 5 MG tablet     . aspirin EC 81 MG tablet Take 81 mg by mouth daily.    . Cholecalciferol (VITAMIN D3) 1000 UNITS CAPS Take 1 capsule by mouth daily.    . ergocalciferol (DRISDOL) 50000 UNITS capsule Take 1 capsule (50,000 Units total) by mouth once a week. 12 capsule 0  . glipiZIDE (GLUCOTROL) 5 MG tablet Take 2.5 mg by mouth daily before breakfast.     . glucose blood (ONE TOUCH ULTRA TEST) test strip Check sugar bid Dx E11.9 100 each 5  . hydrochlorothiazide (HYDRODIURIL) 25 MG tablet Take 25 mg by mouth every other day.     . isosorbide mononitrate (IMDUR) 60 MG 24 hr tablet Take 60 mg by mouth 2 (two) times daily.     . meclizine (ANTIVERT) 25 MG tablet TAKE ONE TABLET BY MOUTH THREE TIMES DAILY AS NEEDED 30 tablet 5    . nitroGLYCERIN (NITROSTAT) 0.4 MG SL tablet Place 0.4 mg under the tongue every 5 (five) minutes as needed for chest pain.    Marland Kitchen nystatin (MYCOSTATIN) 100000 UNIT/ML suspension Take 5 mLs (500,000 Units total) by mouth 4 (four) times daily. 100 mL 0  . omeprazole (PRILOSEC) 20 MG capsule Take 1 capsule (20 mg total) by mouth daily. 30 capsule 5  . ONETOUCH DELICA LANCETS 33L MISC USE ONE LANCET TO CHECK BLOOD SUGAR TWICE DAILY  Dx Code E11.9 100 each 0  . psyllium (METAMUCIL) 58.6 % packet Take 1 packet by mouth daily.    . simvastatin (ZOCOR) 20 MG tablet Take 20 mg by mouth every other day.    . warfarin (COUMADIN) 2 MG tablet Take 2-4 mg by mouth daily. Pt takes one tablet on Sunday, Tuesday, and Thursday. Pt takes two tablets on Monday, Wednesday, Friday, and Saturday.    Marland Kitchen atenolol (TENORMIN) 50 MG tablet TAKE ONE TABLET BY MOUTH TWICE DAILY 180 tablet 0  . lisinopril (PRINIVIL,ZESTRIL) 10 MG tablet Take 1 tablet (10 mg total) by mouth daily. 90 tablet 1   No facility-administered medications prior to visit.    Review of Systems;  Patient denies headache, fevers, malaise, unintentional weight loss, skin rash, eye pain, sinus congestion  and sinus pain, sore throat, dysphagia,  hemoptysis , cough, dyspnea, wheezing, chest pain, palpitations, orthopnea, edema, abdominal pain, nausea, melena, diarrhea, constipation, flank pain, dysuria, hematuria, urinary  Frequency, nocturia, numbness, tingling, seizures,  Focal weakness, Loss of consciousness,  Tremor, insomnia, depression, anxiety, and suicidal ideation.      Objective:  BP 124/62 mmHg  Pulse 63  Temp(Src) 98 F (36.7 C) (Oral)  Wt 144 lb 1.9 oz (65.372 kg)  SpO2 97%  BP Readings from Last 3 Encounters:  04/01/15 124/62  02/25/15 140/70  01/07/15 144/70    Wt Readings from Last 3 Encounters:  04/01/15 144 lb 1.9 oz (65.372 kg)  02/25/15 144 lb 12 oz (65.658 kg)  01/07/15 144 lb 12 oz (65.658 kg)    General appearance:  alert, cooperative and appears stated age Ears: normal TM's and external ear canals both ears Throat: lips, mucosa, and tongue normal; teeth and gums normal Neck: no adenopathy, no carotid bruit, supple, symmetrical, trachea midline and thyroid not enlarged, symmetric, no tenderness/mass/nodules Back: symmetric, no curvature. ROM normal. No CVA tenderness. Lungs: clear to auscultation bilaterally Heart: regular rate and rhythm, S1, S2 normal, no murmur, click, rub or gallop Abdomen: soft, non-tender; bowel sounds normal; no masses,  no organomegaly Pulses: 2+ and symmetric Skin: Skin color, texture, turgor normal. No rashes or lesions Lymph nodes: Cervical, supraclavicular, and axillary nodes normal.  Lab Results  Component Value Date   HGBA1C 6.3 03/13/2015   HGBA1C 6.3 08/22/2014   HGBA1C 6.3 05/22/2014    Lab Results  Component Value Date   CREATININE 0.75 03/13/2015   CREATININE 0.66 12/04/2014   CREATININE 0.69 10/05/2014    Lab Results  Component Value Date   WBC 6.2 12/04/2014   HGB 14.9 12/04/2014   HCT 45.0 12/04/2014   PLT 201 12/04/2014   GLUCOSE 117* 03/13/2015   CHOL 158 03/13/2015   TRIG 60.0 03/13/2015   HDL 60.00 03/13/2015   LDLDIRECT 95.0 03/13/2015   LDLCALC 86 03/13/2015   ALT 17 03/13/2015   AST 19 03/13/2015   NA 141 03/13/2015   K 4.2 03/13/2015   CL 104 03/13/2015   CREATININE 0.75 03/13/2015   BUN 10 03/13/2015   CO2 31 03/13/2015   TSH 0.62 02/15/2014   INR 2.26 12/04/2014   HGBA1C 6.3 03/13/2015   MICROALBUR 0.6 05/22/2014    Dg Chest 2 View  12/04/2014  CLINICAL DATA:  Chest discomfort and fluttering for 2 days. EXAM: CHEST  2 VIEW COMPARISON:  10/05/2014 FINDINGS: Chronic hyperinflation, likely COPD. There is no edema, consolidation, effusion, or pneumothorax. Calcified granuloma noted at the right apex. Normal heart size and mediastinal contours. No acute osseous findings. IMPRESSION: No active cardiopulmonary disease. Electronically  Signed   By: Monte Fantasia M.D.   On: 12/04/2014 16:50    Assessment & Plan:   Problem List Items Addressed This Visit    Generalized anxiety disorder    Diagnosis discussed with patient and daughter .  She prefers to defer treatment       Essential hypertension, benign    Uncontrolled due to medication changes not employed by patient.  increasing lisinopril to 20 mg bid,  Adding amlodipine prn BP > 160/  Continue Imdur twice daily        Relevant Medications   lisinopril (PRINIVIL,ZESTRIL) 20 MG tablet   atenolol (TENORMIN) 25 MG tablet   Dry mouth    Still problematic.  Continue Biotene      Atrial fibrillation ([place]) -  Primary    Rate controlled with atenolol bid,  Reducing dose to 25 mg today due to symptomatic bradycardia.       Relevant Medications   lisinopril (PRINIVIL,ZESTRIL) 20 MG tablet   atenolol (TENORMIN) 25 MG tablet      I have changed Ms. Dubas's lisinopril and atenolol. I am also having her maintain her hydrochlorothiazide, isosorbide mononitrate, nitroGLYCERIN, nystatin, omeprazole, meclizine, aspirin EC, simvastatin, warfarin, psyllium, glipiZIDE, ONETOUCH DELICA LANCETS 82N, glucose blood, Vitamin D3, amLODipine, and ergocalciferol.  Meds ordered this encounter  Medications  . lisinopril (PRINIVIL,ZESTRIL) 20 MG tablet    Sig: Take 1 tablet (20 mg total) by mouth 2 (two) times daily.    Dispense:  60 tablet    Refill:  3    Keep on file for future refills note dose change  . atenolol (TENORMIN) 25 MG tablet    Sig: Take 1 tablet (25 mg total) by mouth 2 (two) times daily.    Dispense:  60 tablet    Refill:  3    NOTE DOSE REDUCTION; KEEP ON FILE    Medications Discontinued During This Encounter  Medication Reason  . lisinopril (PRINIVIL,ZESTRIL) 10 MG tablet Reorder  . atenolol (TENORMIN) 50 MG tablet Reorder     A total of 25 minutes of face to face time was spent with patient more than half of which was spent in counselling and  coordination of care   Follow-up: Return in about 3 months (around 07/02/2015) for follow up diabetes.   Crecencio Mc, MD

## 2015-04-02 DIAGNOSIS — F411 Generalized anxiety disorder: Secondary | ICD-10-CM | POA: Insufficient documentation

## 2015-04-02 NOTE — Assessment & Plan Note (Signed)
Diagnosis discussed with patient and daughter .  She prefers to defer treatment

## 2015-04-22 DIAGNOSIS — I482 Chronic atrial fibrillation: Secondary | ICD-10-CM | POA: Diagnosis not present

## 2015-04-22 DIAGNOSIS — G250 Essential tremor: Secondary | ICD-10-CM | POA: Diagnosis not present

## 2015-04-22 DIAGNOSIS — I10 Essential (primary) hypertension: Secondary | ICD-10-CM | POA: Diagnosis not present

## 2015-04-22 DIAGNOSIS — R001 Bradycardia, unspecified: Secondary | ICD-10-CM | POA: Diagnosis not present

## 2015-04-22 DIAGNOSIS — I480 Paroxysmal atrial fibrillation: Secondary | ICD-10-CM | POA: Diagnosis not present

## 2015-05-20 DIAGNOSIS — I480 Paroxysmal atrial fibrillation: Secondary | ICD-10-CM | POA: Diagnosis not present

## 2015-06-03 ENCOUNTER — Other Ambulatory Visit: Payer: Self-pay | Admitting: Internal Medicine

## 2015-06-19 ENCOUNTER — Other Ambulatory Visit: Payer: Self-pay | Admitting: Internal Medicine

## 2015-06-19 DIAGNOSIS — I480 Paroxysmal atrial fibrillation: Secondary | ICD-10-CM | POA: Diagnosis not present

## 2015-07-15 ENCOUNTER — Other Ambulatory Visit: Payer: Self-pay | Admitting: Internal Medicine

## 2015-07-16 NOTE — Telephone Encounter (Signed)
Norvasc filled 

## 2015-07-17 DIAGNOSIS — I480 Paroxysmal atrial fibrillation: Secondary | ICD-10-CM | POA: Diagnosis not present

## 2015-07-24 ENCOUNTER — Encounter: Payer: Self-pay | Admitting: Internal Medicine

## 2015-07-24 ENCOUNTER — Ambulatory Visit (INDEPENDENT_AMBULATORY_CARE_PROVIDER_SITE_OTHER): Payer: Medicare Other | Admitting: Internal Medicine

## 2015-07-24 VITALS — BP 160/72 | HR 60 | Temp 97.9°F | Ht 66.0 in | Wt 138.5 lb

## 2015-07-24 DIAGNOSIS — R7989 Other specified abnormal findings of blood chemistry: Secondary | ICD-10-CM

## 2015-07-24 DIAGNOSIS — K5909 Other constipation: Secondary | ICD-10-CM

## 2015-07-24 DIAGNOSIS — I209 Angina pectoris, unspecified: Secondary | ICD-10-CM

## 2015-07-24 DIAGNOSIS — E119 Type 2 diabetes mellitus without complications: Secondary | ICD-10-CM | POA: Diagnosis not present

## 2015-07-24 DIAGNOSIS — R42 Dizziness and giddiness: Secondary | ICD-10-CM | POA: Diagnosis not present

## 2015-07-24 DIAGNOSIS — I10 Essential (primary) hypertension: Secondary | ICD-10-CM

## 2015-07-24 DIAGNOSIS — K5900 Constipation, unspecified: Secondary | ICD-10-CM

## 2015-07-24 LAB — COMPREHENSIVE METABOLIC PANEL
ALBUMIN: 3.9 g/dL (ref 3.5–5.2)
ALT: 16 U/L (ref 0–35)
AST: 20 U/L (ref 0–37)
Alkaline Phosphatase: 69 U/L (ref 39–117)
BUN: 8 mg/dL (ref 6–23)
CHLORIDE: 100 meq/L (ref 96–112)
CO2: 32 meq/L (ref 19–32)
CREATININE: 0.75 mg/dL (ref 0.40–1.20)
Calcium: 9.7 mg/dL (ref 8.4–10.5)
GFR: 77.71 mL/min (ref >60.00)
Glucose, Bld: 130 mg/dL — ABNORMAL HIGH (ref 70–99)
POTASSIUM: 4.3 meq/L (ref 3.5–5.1)
SODIUM: 138 meq/L (ref 135–145)
Total Bilirubin: 0.8 mg/dL (ref 0.2–1.2)
Total Protein: 6.5 g/dL (ref 6.0–8.3)

## 2015-07-24 LAB — CBC WITH DIFFERENTIAL/PLATELET
BASOS ABS: 0 10*3/uL (ref 0.0–0.1)
BASOS PCT: 0.5 % (ref 0.0–3.0)
EOS ABS: 0.2 10*3/uL (ref 0.0–0.7)
Eosinophils Relative: 4.1 % (ref 0.0–5.0)
HEMATOCRIT: 48.2 % — AB (ref 36.0–46.0)
HEMOGLOBIN: 15.8 g/dL — AB (ref 12.0–15.0)
LYMPHS PCT: 16.3 % (ref 12.0–46.0)
Lymphs Abs: 0.8 10*3/uL (ref 0.7–4.0)
MCHC: 32.8 g/dL (ref 30.0–36.0)
MCV: 94.2 fl (ref 78.0–100.0)
MONOS PCT: 10 % (ref 3.0–12.0)
Monocytes Absolute: 0.5 10*3/uL (ref 0.1–1.0)
NEUTROS ABS: 3.6 10*3/uL (ref 1.4–7.7)
Neutrophils Relative %: 69.1 % (ref 43.0–77.0)
Platelets: 234 10*3/uL (ref 150.0–400.0)
RBC: 5.11 Mil/uL (ref 3.87–5.11)
RDW: 13.9 % (ref 11.5–15.5)
WBC: 5.2 10*3/uL (ref 4.0–10.5)

## 2015-07-24 LAB — MICROALBUMIN / CREATININE URINE RATIO
CREATININE, U: 128.5 mg/dL
MICROALB UR: 1.1 mg/dL (ref 0.0–1.9)
MICROALB/CREAT RATIO: 0.9 mg/g (ref 0.0–30.0)

## 2015-07-24 LAB — VITAMIN D 25 HYDROXY (VIT D DEFICIENCY, FRACTURES): VITD: 42.7 ng/mL (ref 30.00–100.00)

## 2015-07-24 LAB — IBC PANEL
Iron: 116 ug/dL (ref 42–145)
SATURATION RATIOS: 33.1 % (ref 20.0–50.0)
TRANSFERRIN: 250 mg/dL (ref 212.0–360.0)

## 2015-07-24 LAB — LDL CHOLESTEROL, DIRECT: LDL DIRECT: 122 mg/dL

## 2015-07-24 LAB — VITAMIN B12: Vitamin B-12: 361 pg/mL (ref 211–911)

## 2015-07-24 LAB — HEMOGLOBIN A1C: HEMOGLOBIN A1C: 6.4 % (ref 4.6–6.5)

## 2015-07-24 MED ORDER — PEG 3350-KCL-NABCB-NACL-NASULF 236 G PO SOLR: ORAL | Status: DC

## 2015-07-24 NOTE — Progress Notes (Signed)
Pre visit review using our clinic review tool, if applicable. No additional management support is needed unless otherwise documented below in the visit note. 

## 2015-07-24 NOTE — Patient Instructions (Addendum)
I am ordering medicine to "clean out  Your bowels " .  Drink an 8 ounce glass of it  every 30 minutes  Until your stools become almost clear,   aAbout 4 or 5 glasses.  Once your stools become pretty clear water you can stop drinking it.   Resume your metamucil and stool softener that evening.  I would like you to resume amlodipine daily 5 mg  In the morning

## 2015-07-24 NOTE — Progress Notes (Signed)
Subjective:  Patient ID: Leah Holmes, female    DOB: [birth year]  Age: 82 y.o. MRN: [id number]  CC: The primary encounter diagnosis was Diabetes mellitus without complication ([place]). Diagnoses of Essential hypertension, benign, Low serum vitamin D, Ischemic chest pain ([place]), Dizziness and giddiness, Constipation, unspecified constipation type, and Other constipation were also pertinent to this visit.  Leah Holmes presents for FOLLOW UP on type 2 DM,  Hypertension and CAD.  Has lost 6 lbs since October.  "my stomach has been hurting  upper and lower, stays full of gas.  Reports constipation.  Stools are green and have become narrow in caliber for the last two weeks.  Feel bad all over,  No appetite.  Using artificial sweeteners in everything per daughter    Feels dizzy,  Feels off balance when she first stands up is the worst.  Feet feel heavy .  Has not had any falls. Using a 4 foot cane.   Taking antivert 3 times daily starting today,  Has been using it once daily for the past week.  Seems to help but it makes  her sleepy. Sleeping ok at night. In bed by 10 and up by 7 , naps during the day.  Lives with daughter who is present today.   Has been taking 1/2 pill before dinner,  Checking  sugars twice daily  BP 160/75  170 lying   Pulse irreg  As per history of atrial fib      Lab Results  Component Value Date   HGBA1C 6.4 07/24/2015   Lab Results  Component Value Date   CHOL 158 03/13/2015   HDL 60.00 03/13/2015   LDLCALC 86 03/13/2015   LDLDIRECT 122.0 07/24/2015   TRIG 60.0 03/13/2015   CHOLHDL 3 03/13/2015     Outpatient Prescriptions Prior to Visit  Medication Sig Dispense Refill  . amLODipine (NORVASC) 5 MG tablet     . amLODipine (NORVASC) 5 MG tablet TAKE ONE TABLET BY MOUTH ONCE DAILY 90 tablet 3  . aspirin EC 81 MG tablet Take 81 mg by mouth daily.    Marland Kitchen atenolol (TENORMIN) 25 MG tablet Take 1 tablet (25 mg total) by mouth 2 (two) times daily. 60 tablet 3  .  Cholecalciferol (VITAMIN D3) 1000 UNITS CAPS Take 1 capsule by mouth daily.    . ergocalciferol (DRISDOL) 50000 UNITS capsule Take 1 capsule (50,000 Units total) by mouth once a week. 12 capsule 0  . glipiZIDE (GLUCOTROL) 5 MG tablet Take 2.5 mg by mouth daily before breakfast.     . glipiZIDE (GLUCOTROL) 5 MG tablet TAKE ONE TABLET BY MOUTH TWICE DAILY BEFORE  A  MEAL 180 tablet 0  . glucose blood (ONE TOUCH ULTRA TEST) test strip Check sugar bid Dx E11.9 100 each 5  . hydrochlorothiazide (HYDRODIURIL) 25 MG tablet Take 25 mg by mouth every other day.     . isosorbide mononitrate (IMDUR) 60 MG 24 hr tablet Take 60 mg by mouth 2 (two) times daily.     Marland Kitchen lisinopril (PRINIVIL,ZESTRIL) 20 MG tablet Take 1 tablet (20 mg total) by mouth 2 (two) times daily. 60 tablet 3  . meclizine (ANTIVERT) 25 MG tablet TAKE ONE TABLET BY MOUTH THREE TIMES DAILY AS NEEDED 30 tablet 5  . nitroGLYCERIN (NITROSTAT) 0.4 MG SL tablet Place 0.4 mg under the tongue every 5 (five) minutes as needed for chest pain.    Marland Kitchen nystatin (MYCOSTATIN) 100000 UNIT/ML suspension Take 5 mLs (500,000 Units total) by mouth  4 (four) times daily. 100 mL 0  . omeprazole (PRILOSEC) 20 MG capsule TAKE ONE CAPSULE BY MOUTH ONCE DAILY 30 capsule 5  . ONETOUCH DELICA LANCETS 99991111 MISC USE ONE LANCET TO CHECK BLOOD SUGAR TWICE DAILY  Dx Code E11.9 100 each 0  . psyllium (METAMUCIL) 58.6 % packet Take 1 packet by mouth daily.    . simvastatin (ZOCOR) 20 MG tablet Take 20 mg by mouth every other day.    . warfarin (COUMADIN) 2 MG tablet Take 2-4 mg by mouth daily. Pt takes one tablet on Sunday, Tuesday, and Thursday. Pt takes two tablets on Monday, Wednesday, Friday, and Saturday.     No facility-administered medications prior to visit.    Review of Systems;  Patient denies headache, fevers, malaise, unintentional weight loss, skin rash, eye pain, sinus congestion and sinus pain, sore throat, dysphagia,  hemoptysis , cough, dyspnea, wheezing, chest  pain, palpitations, orthopnea, edema, abdominal pain, nausea, melena, diarrhea, constipation, flank pain, dysuria, hematuria, urinary  Frequency, nocturia, numbness, tingling, seizures,  Focal weakness, Loss of consciousness,  Tremor, insomnia, depression, anxiety, and suicidal ideation.      Objective:  BP 160/72 mmHg  Pulse 60  Temp(Src) 97.9 F (36.6 C) (Oral)  Ht 5\' 6"  (1.676 m)  Wt 138 lb 8 oz (62.823 kg)  BMI 22.37 kg/m2  SpO2 97%  BP Readings from Last 3 Encounters:  07/24/15 160/72  04/01/15 124/62  02/25/15 140/70    Wt Readings from Last 3 Encounters:  07/24/15 138 lb 8 oz (62.823 kg)  04/01/15 144 lb 1.9 oz (65.372 kg)  02/25/15 144 lb 12 oz (65.658 kg)    General appearance: alert, cooperative and appears stated age Ears: normal TM's and external ear canals both ears Throat: lips, mucosa, and tongue normal; teeth and gums normal Neck: no adenopathy, no carotid bruit, supple, symmetrical, trachea midline and thyroid not enlarged, symmetric, no tenderness/mass/nodules Back: symmetric, no curvature. ROM normal. No CVA tenderness. Lungs: clear to auscultation bilaterally Heart: regular rate and rhythm, S1, S2 normal, no murmur, click, rub or gallop Abdomen: soft, non-tender; bowel sounds normal; no masses,  no organomegaly Pulses: 2+ and symmetric Skin: Skin color, texture, turgor normal. No rashes or lesions Lymph nodes: Cervical, supraclavicular, and axillary nodes normal.  Lab Results  Component Value Date   HGBA1C 6.4 07/24/2015   HGBA1C 6.3 03/13/2015   HGBA1C 6.3 08/22/2014    Lab Results  Component Value Date   CREATININE 0.75 07/24/2015   CREATININE 0.75 03/13/2015   CREATININE 0.66 12/04/2014    Lab Results  Component Value Date   WBC 5.2 07/24/2015   HGB 15.8* 07/24/2015   HCT 48.2* 07/24/2015   PLT 234.0 07/24/2015   GLUCOSE 130* 07/24/2015   CHOL 158 03/13/2015   TRIG 60.0 03/13/2015   HDL 60.00 03/13/2015   LDLDIRECT 122.0 07/24/2015     LDLCALC 86 03/13/2015   ALT 16 07/24/2015   AST 20 07/24/2015   NA 138 07/24/2015   K 4.3 07/24/2015   CL 100 07/24/2015   CREATININE 0.75 07/24/2015   BUN 8 07/24/2015   CO2 32 07/24/2015   TSH 0.62 02/15/2014   INR 2.26 12/04/2014   HGBA1C 6.4 07/24/2015   MICROALBUR 1.1 07/24/2015    Dg Chest 2 View  12/04/2014  CLINICAL DATA:  Chest discomfort and fluttering for 2 days. EXAM: CHEST  2 VIEW COMPARISON:  10/05/2014 FINDINGS: Chronic hyperinflation, likely COPD. There is no edema, consolidation, effusion, or pneumothorax. Calcified granuloma noted  at the right apex. Normal heart size and mediastinal contours. No acute osseous findings. IMPRESSION: No active cardiopulmonary disease. Electronically Signed   By: Monte Fantasia M.D.   On: 12/04/2014 16:50    Assessment & Plan:   Problem List Items Addressed This Visit    Diabetes mellitus without complication ([place]) - Primary    Reassurance that her diabetes is under good control.  continue glipizide 2.5 mg once daily .  Lab Results  Component Value Date   HGBA1C 6.4 07/24/2015             Relevant Orders   Microalbumin / creatinine urine ratio (Completed)   Hemoglobin A1c (Completed)   Essential hypertension, benign    Advised to resume amlodipine 5 mg daily       Relevant Orders   Comprehensive metabolic panel (Completed)   LDL cholesterol, direct (Completed)   Constipation    suggested by exam and symptoms .Marland Kitchen  Cathartic formula prescribed, followed by daily regimen outlined. Lytes are normal.  Has not had thyroid function checked in over a year  Will check if recurrent.   Lab Results  Component Value Date   TSH 0.62 02/15/2014   Lab Results  Component Value Date   NA 138 07/24/2015   K 4.3 07/24/2015   CL 100 07/24/2015   CO2 32 07/24/2015         Ischemic chest pain ([facility])   Low serum vitamin D   Relevant Orders   VITAMIN D 25 Hydroxy (Vit-D Deficiency, Fractures) (Completed)    Other Visit  Diagnoses    Dizziness and giddiness        Relevant Orders    Vitamin B12 (Completed)    CBC with Differential/Platelet (Completed)    IBC panel (Completed)       I am having Ms. Debellis start on polyethylene glycol. I am also having her maintain her hydrochlorothiazide, isosorbide mononitrate, nitroGLYCERIN, nystatin, meclizine, aspirin EC, simvastatin, warfarin, psyllium, glipiZIDE, ONETOUCH DELICA LANCETS 99991111, glucose blood, Vitamin D3, amLODipine, ergocalciferol, lisinopril, atenolol, glipiZIDE, omeprazole, and amLODipine.  Meds ordered this encounter  Medications  . polyethylene glycol (GOLYTELY) 236 g solution    Sig: Drink 8 ounces every 30 minutes until stool is clear    Dispense:  4000 mL    Refill:  0    There are no discontinued medications.  Follow-up: Return in about 3 months (around 10/21/2015).   Crecencio Mc, MD

## 2015-07-27 DIAGNOSIS — K5900 Constipation, unspecified: Secondary | ICD-10-CM | POA: Insufficient documentation

## 2015-07-27 NOTE — Assessment & Plan Note (Signed)
suggested by exam and symptoms .Marland Kitchen  Cathartic formula prescribed, followed by daily regimen outlined. Lytes are normal.  Has not had thyroid function checked in over a year  Will check if recurrent.   Lab Results  Component Value Date   TSH 0.62 02/15/2014   Lab Results  Component Value Date   NA 138 07/24/2015   K 4.3 07/24/2015   CL 100 07/24/2015   CO2 32 07/24/2015

## 2015-07-27 NOTE — Assessment & Plan Note (Signed)
Reassurance that her diabetes is under good control.  continue glipizide 2.5 mg once daily .  Lab Results  Component Value Date   HGBA1C 6.4 07/24/2015

## 2015-07-27 NOTE — Assessment & Plan Note (Signed)
Advised to resume amlodipine 5 mg daily

## 2015-07-28 ENCOUNTER — Encounter: Payer: Self-pay | Admitting: Internal Medicine

## 2015-08-13 NOTE — Telephone Encounter (Signed)
Mailed unread message to patient.  

## 2015-08-14 ENCOUNTER — Encounter: Payer: Self-pay | Admitting: Internal Medicine

## 2015-08-15 NOTE — Telephone Encounter (Signed)
I have check the AVS and patient emails for any indication where you may have written for her to stop glipizide.  Please advise?

## 2015-08-17 ENCOUNTER — Other Ambulatory Visit: Payer: Self-pay | Admitting: Internal Medicine

## 2015-08-20 DIAGNOSIS — I480 Paroxysmal atrial fibrillation: Secondary | ICD-10-CM | POA: Diagnosis not present

## 2015-08-20 DIAGNOSIS — I071 Rheumatic tricuspid insufficiency: Secondary | ICD-10-CM | POA: Diagnosis not present

## 2015-08-20 DIAGNOSIS — I482 Chronic atrial fibrillation: Secondary | ICD-10-CM | POA: Diagnosis not present

## 2015-08-20 DIAGNOSIS — I272 Other secondary pulmonary hypertension: Secondary | ICD-10-CM | POA: Diagnosis not present

## 2015-08-20 DIAGNOSIS — I2510 Atherosclerotic heart disease of native coronary artery without angina pectoris: Secondary | ICD-10-CM | POA: Diagnosis not present

## 2015-09-17 DIAGNOSIS — I480 Paroxysmal atrial fibrillation: Secondary | ICD-10-CM | POA: Diagnosis not present

## 2015-09-19 ENCOUNTER — Other Ambulatory Visit: Payer: Self-pay | Admitting: Internal Medicine

## 2015-09-23 ENCOUNTER — Emergency Department
Admission: EM | Admit: 2015-09-23 | Discharge: 2015-09-23 | Disposition: A | Discharge status: Home / Self Care | Payer: Medicare Other | Attending: Emergency Medicine | Admitting: Emergency Medicine

## 2015-09-23 ENCOUNTER — Telehealth: Payer: Self-pay | Admitting: Internal Medicine

## 2015-09-23 ENCOUNTER — Encounter: Payer: Self-pay | Admitting: Emergency Medicine

## 2015-09-23 ENCOUNTER — Ambulatory Visit: Payer: PRIVATE HEALTH INSURANCE | Admitting: Internal Medicine

## 2015-09-23 ENCOUNTER — Emergency Department: Payer: Medicare Other

## 2015-09-23 DIAGNOSIS — I110 Hypertensive heart disease with heart failure: Secondary | ICD-10-CM | POA: Diagnosis not present

## 2015-09-23 DIAGNOSIS — R0602 Shortness of breath: Secondary | ICD-10-CM | POA: Diagnosis not present

## 2015-09-23 DIAGNOSIS — E785 Hyperlipidemia, unspecified: Secondary | ICD-10-CM | POA: Insufficient documentation

## 2015-09-23 DIAGNOSIS — Z7984 Long term (current) use of oral hypoglycemic drugs: Secondary | ICD-10-CM | POA: Insufficient documentation

## 2015-09-23 DIAGNOSIS — F17290 Nicotine dependence, other tobacco product, uncomplicated: Secondary | ICD-10-CM | POA: Diagnosis not present

## 2015-09-23 DIAGNOSIS — E119 Type 2 diabetes mellitus without complications: Secondary | ICD-10-CM | POA: Diagnosis not present

## 2015-09-23 DIAGNOSIS — Z79899 Other long term (current) drug therapy: Secondary | ICD-10-CM | POA: Insufficient documentation

## 2015-09-23 DIAGNOSIS — Z7901 Long term (current) use of anticoagulants: Secondary | ICD-10-CM | POA: Insufficient documentation

## 2015-09-23 DIAGNOSIS — I4891 Unspecified atrial fibrillation: Secondary | ICD-10-CM

## 2015-09-23 DIAGNOSIS — I509 Heart failure, unspecified: Secondary | ICD-10-CM | POA: Diagnosis not present

## 2015-09-23 DIAGNOSIS — Z7982 Long term (current) use of aspirin: Secondary | ICD-10-CM | POA: Insufficient documentation

## 2015-09-23 DIAGNOSIS — I2510 Atherosclerotic heart disease of native coronary artery without angina pectoris: Secondary | ICD-10-CM | POA: Diagnosis not present

## 2015-09-23 LAB — URINALYSIS COMPLETE WITH MICROSCOPIC (ARMC ONLY)
Bacteria, UA: NONE SEEN
Bilirubin Urine: NEGATIVE
Glucose, UA: NEGATIVE mg/dL
KETONES UR: NEGATIVE mg/dL
LEUKOCYTES UA: NEGATIVE
NITRITE: NEGATIVE
PH: 8 (ref 5.0–8.0)
PROTEIN: NEGATIVE mg/dL
SPECIFIC GRAVITY, URINE: 1.003 — AB (ref 1.005–1.030)

## 2015-09-23 LAB — BASIC METABOLIC PANEL
Anion gap: 6 (ref 5–15)
BUN: 15 mg/dL (ref 6–20)
CHLORIDE: 101 mmol/L (ref 101–111)
CO2: 26 mmol/L (ref 22–32)
CREATININE: 0.63 mg/dL (ref 0.44–1.00)
Calcium: 9.3 mg/dL (ref 8.9–10.3)
GFR calc Af Amer: >60 mL/min (ref >60)
GLUCOSE: 155 mg/dL — AB (ref 65–99)
Potassium: 3.6 mmol/L (ref 3.5–5.1)
SODIUM: 133 mmol/L — AB (ref 135–145)

## 2015-09-23 LAB — TROPONIN I
Troponin I: <0.03 ng/mL (ref <0.031)
Troponin I: <0.03 ng/mL (ref <0.031)

## 2015-09-23 LAB — CBC
HEMATOCRIT: 43.4 % (ref 35.0–47.0)
Hemoglobin: 14.7 g/dL (ref 12.0–16.0)
MCH: 31 pg (ref 26.0–34.0)
MCHC: 33.8 g/dL (ref 32.0–36.0)
MCV: 91.8 fL (ref 80.0–100.0)
Platelets: 196 10*3/uL (ref 150–440)
RBC: 4.73 MIL/uL (ref 3.80–5.20)
RDW: 13.6 % (ref 11.5–14.5)
WBC: 5.1 10*3/uL (ref 3.6–11.0)

## 2015-09-23 MED ORDER — SODIUM CHLORIDE 0.9 % IV BOLUS (SEPSIS)
500.0000 mL | Freq: Once | INTRAVENOUS | Status: AC
  Administered 2015-09-23: 500 mL via INTRAVENOUS

## 2015-09-23 NOTE — ED Notes (Addendum)
Report from Kim, RN

## 2015-09-23 NOTE — Telephone Encounter (Signed)
Please advise, FYI this was your 630appt.

## 2015-09-23 NOTE — ED Notes (Signed)
MD at bedside. 

## 2015-09-23 NOTE — Discharge Instructions (Signed)
Please seek medical attention for any high fevers, chest pain, shortness of breath, change in behavior, persistent vomiting, bloody stool or any other new or concerning symptoms.   Atrial Fibrillation Atrial fibrillation is a type of heartbeat that is irregular or fast (rapid). If you have this condition, your heart keeps quivering in a weird (chaotic) way. This condition can make it so your heart cannot pump blood normally. Having this condition gives a person more risk for stroke, heart failure, and other heart problems. There are different types of atrial fibrillation. Talk with your doctor to learn about the type that you have. HOME CARE  Take over-the-counter and prescription medicines only as told by your doctor.  If your doctor prescribed a blood-thinning medicine, take it exactly as told. Taking too much of it can cause bleeding. If you do not take enough of it, you will not have the protection that you need against stroke and other problems.  Do not use any tobacco products. These include cigarettes, chewing tobacco, and e-cigarettes. If you need help quitting, ask your doctor.  If you have apnea (obstructive sleep apnea), manage it as told by your doctor.  Do not drink alcohol.  Do not drink beverages that have caffeine. These include coffee, soda, and tea.  Maintain a healthy weight. Do not use diet pills unless your doctor says they are safe for you. Diet pills may make heart problems worse.  Follow diet instructions as told by your doctor.  Exercise regularly as told by your doctor.  Keep all follow-up visits as told by your doctor. This is important. GET HELP IF:  You notice a change in the speed, rhythm, or strength of your heartbeat.  You are taking a blood-thinning medicine and you notice more bruising.  You get tired more easily when you move or exercise. GET HELP RIGHT AWAY IF:  You have pain in your chest or your belly (abdomen).  You have sweating or  weakness.  You feel sick to your stomach (nauseous).  You notice blood in your throw up (vomit), poop (stool), or pee (urine).  You are short of breath.  You suddenly have swollen feet and ankles.  You feel dizzy.  Your suddenly get weak or numb in your face, arms, or legs, especially if it happens on one side of your body.  You have trouble talking, trouble understanding, or both.  Your face or your eyelid droops on one side. These symptoms may be an emergency. Do not wait to see if the symptoms will go away. Get medical help right away. Call your local emergency services (911 in the [place] Do not drive yourself to the hospital.   This information is not intended to replace advice given to you by your health care provider. Make sure you discuss any questions you have with your health care provider.   Document Released: 03/10/2008 Document Revised: 02/20/2015 Document Reviewed: 09/26/2014 Elsevier Interactive Patient Education [employer].

## 2015-09-23 NOTE — ED Notes (Signed)
Pt alert and oriented X4, active, cooperative, pt in NAD. RR even and unlabored, color WNL.  Pt informed to return if any life threatening symptoms occur.   

## 2015-09-23 NOTE — Telephone Encounter (Signed)
Patient Name: Leah Holmes  DOB: September 26, 1926    Initial Comment Caller states, mother blood pressure is up, 177/95 and shaky, after taking her blood pressure Rx today    Nurse Assessment  Nurse: Raphael Gibney, RN, Vera Date/Time (Eastern Time): 09/23/2015 12:39:09 PM  Confirm and document reason for call. If symptomatic, describe symptoms. You must click the next button to save text entered. ---Caller states mother's BP was 177/95 and it is now 198/92. She is shaky. Her heart is beating more out of rhythm.  Has the patient traveled out of the country within the last 30 days? ---Not Applicable  Does the patient have any new or worsening symptoms? ---Yes  Will a triage be completed? ---Yes  Related visit to physician within the last 2 weeks? ---No  Does the PT have any chronic conditions? (i.e. diabetes, asthma, etc.) ---Yes  List chronic conditions. ---diabetes; HTN: A fib; CHF  Is this a behavioral health or substance abuse call? ---No     Guidelines    Guideline Title Affirmed Question Affirmed Notes  Heart Rate and Heartbeat Questions Dizziness, lightheadedness, or weakness    Final Disposition User   Go to ED Now Raphael Gibney, RN, Vanita Ingles    Comments  Cancelled 6:30 pm appt on 09/23/15 with Dr. Deborra Medina as pt is going to the ER   Referrals  [facility] - ED   Disagree/Comply: Comply

## 2015-09-23 NOTE — ED Notes (Signed)
Says she dfeels bad in her chest.  Started this am

## 2015-09-23 NOTE — ED Provider Notes (Signed)
[facility] Emergency Department Provider Note  ____________________________________________  Time seen: ~1550  I have reviewed the triage vital signs and the nursing notes.   HISTORY  Chief Complaint Shortness of Breath   History limited by: Not Limited   HPI Leah Holmes is a 82 y.o. female who presents to the emergency department today because of concerns for increased weakness, palpitations and shortness breath. Patient states that she always feels a slightly irregular heart rhythm. She does have a history of atrial fibrillation. She states that she has noticed more the past week and then today even more so. She feels like it is now also skipping beats. This is intermittent. This has become more frequent the past day. She denies any pain associated with this. She has had some associated shortness of breath. Furthermore the patient feels increasingly weak over the past week. She denies any fevers. Denies any nausea or vomiting. States she has had a good appetite.   Past Medical History  Diagnosis Date  . Diabetes mellitus without complication ([place])   . Ulcer   . Hyperlipidemia   . Hypertension   . Polyp of colon   . Atrial fibrillation ([place]) 2012  . GERD (gastroesophageal reflux disease)   . Irritable bowel syndrome   . Colon polyp 2003  . CHF (congestive heart failure) ([place])   . Coronary artery disease     Patient Active Problem List   Diagnosis Date Noted  . Constipation 07/27/2015  . Generalized anxiety disorder 04/02/2015  . Ischemic chest pain ([place]) 01/08/2015  . Atrial fibrillation ([place]) 08/16/2014  . Medicare annual wellness visit, subsequent 05/23/2014  . Dry mouth 03/10/2014  . Low serum vitamin D 02/16/2014  . Rectal bleeding 09/20/2013  . Dysphagia, unspecified(787.20) 09/20/2013  . Anal polyp 09/20/2013  . Personal history of colonic polyps 08/31/2013  . CAD (coronary artery disease) 03/09/2013  . Diabetes mellitus without  complication ([place]) [id number]  . Hyperlipidemia associated with type 2 diabetes mellitus ([place]) 03/09/2013  . Essential hypertension, benign 03/09/2013  . Nonulcer dyspepsia 03/09/2013    Past Surgical History  Procedure Laterality Date  . Appendectomy    . Breast surgery    . Abdominal hysterectomy    . Cholecystectomy  03-2012  . Colonoscopy  2003    Dr Theodosia Paling in [place]  . Upper gi endoscopy  2003    Dr Theodosia Paling in [place]  . Fibroid tumor    . Abdominal adhesion surgery      Current Outpatient Rx  Name  Route  Sig  Dispense  Refill  . amLODipine (NORVASC) 5 MG tablet               . amLODipine (NORVASC) 5 MG tablet      TAKE ONE TABLET BY MOUTH ONCE DAILY   90 tablet   3   . aspirin EC 81 MG tablet   Oral   Take 81 mg by mouth daily.         Marland Kitchen atenolol (TENORMIN) 25 MG tablet      TAKE ONE TABLET BY MOUTH TWICE DAILY   60 tablet   2   . Cholecalciferol (VITAMIN D3) 1000 UNITS CAPS   Oral   Take 1 capsule by mouth daily.         . ergocalciferol (DRISDOL) 50000 UNITS capsule   Oral   Take 1 capsule (50,000 Units total) by mouth once a week.   12 capsule   0   . glipiZIDE (GLUCOTROL)  5 MG tablet   Oral   Take 2.5 mg by mouth daily before breakfast.          . glipiZIDE (GLUCOTROL) 5 MG tablet      TAKE ONE TABLET BY MOUTH TWICE DAILY BEFORE  A  MEAL   180 tablet   0   . glucose blood (ONE TOUCH ULTRA TEST) test strip      Check sugar bid Dx E11.9   100 each   5   . hydrochlorothiazide (HYDRODIURIL) 25 MG tablet   Oral   Take 25 mg by mouth every other day.          . isosorbide mononitrate (IMDUR) 60 MG 24 hr tablet   Oral   Take 60 mg by mouth 2 (two) times daily.          Marland Kitchen lisinopril (PRINIVIL,ZESTRIL) 20 MG tablet      TAKE ONE TABLET BY MOUTH TWICE DAILY DOSE  CHANGE   60 tablet   3   . meclizine (ANTIVERT) 25 MG tablet      TAKE ONE TABLET BY MOUTH THREE TIMES DAILY AS NEEDED   30 tablet   5   . nitroGLYCERIN  (NITROSTAT) 0.4 MG SL tablet   Sublingual   Place 0.4 mg under the tongue every 5 (five) minutes as needed for chest pain.         Marland Kitchen nystatin (MYCOSTATIN) 100000 UNIT/ML suspension   Oral   Take 5 mLs (500,000 Units total) by mouth 4 (four) times daily.   100 mL   0   . omeprazole (PRILOSEC) 20 MG capsule      TAKE ONE CAPSULE BY MOUTH ONCE DAILY   30 capsule   5   . ONETOUCH DELICA LANCETS 99991111 MISC      USE ONE LANCET TO CHECK BLOOD SUGAR TWICE DAILY  Dx Code E11.9   100 each   0   . polyethylene glycol (GOLYTELY) 236 g solution      Drink 8 ounces every 30 minutes until stool is clear   4000 mL   0   . psyllium (METAMUCIL) 58.6 % packet   Oral   Take 1 packet by mouth daily.         . simvastatin (ZOCOR) 20 MG tablet   Oral   Take 20 mg by mouth every other day.         . warfarin (COUMADIN) 2 MG tablet   Oral   Take 2-4 mg by mouth daily. Pt takes one tablet on Sunday, Tuesday, and Thursday. Pt takes two tablets on Monday, Wednesday, Friday, and Saturday.           Allergies Codeine; Penicillins; Clonidine derivatives; Gabapentin; Losartan; Morphine and related; Reglan; and Hydralazine  Family History  Problem Relation Age of Onset  . Cancer Sister 48    breast    Social History Social History  Substance Use Topics  . Smoking status: Never Smoker   . Smokeless tobacco: Current User    Types: Snuff     Comment: occasionally  . Alcohol Use: No    Review of Systems  Constitutional: Negative for fever. Cardiovascular: Negative for chest pain.Positive for palpitations Respiratory: Negative for shortness of breath. Gastrointestinal: Negative for abdominal pain, vomiting and diarrhea. Neurological: Negative for headaches, focal weakness or numbness.  10-point ROS otherwise negative.  ____________________________________________   PHYSICAL EXAM:  VITAL SIGNS: ED Triage Vitals  Enc Vitals Group     BP 09/23/15  1346 144/57 mmHg      Pulse Rate 09/23/15 1346 76     Resp 09/23/15 1346 16     Temp 09/23/15 1346 97.9 F (36.6 C)     Temp Source 09/23/15 1346 Oral     SpO2 09/23/15 1346 98 %     Weight 09/23/15 1346 145 lb (65.772 kg)     Height 09/23/15 1346 5\' 6"  (1.676 m)   Constitutional: Alert and oriented. Well appearing and in no distress. Eyes: Conjunctivae are normal. PERRL. Normal extraocular movements. ENT   Head: Normocephalic and atraumatic.   Nose: No congestion/rhinnorhea.   Mouth/Throat: Mucous membranes are moist.   Neck: No stridor. Hematological/Lymphatic/Immunilogical: No cervical lymphadenopathy. Cardiovascular: Irregularly irregular rhythm.  No murmurs, rubs, or gallops. Respiratory: Normal respiratory effort without tachypnea nor retractions. Breath sounds are clear and equal bilaterally. No wheezes/rales/rhonchi. Gastrointestinal: Soft and nontender. No distention.  Genitourinary: Deferred Musculoskeletal: Normal range of motion in all extremities. No joint effusions.  No lower extremity tenderness nor edema. Neurologic:  Normal speech and language. No gross focal neurologic deficits are appreciated.  Skin:  Skin is warm, dry and intact. No rash noted. Psychiatric: Mood and affect are normal. Speech and behavior are normal. Patient exhibits appropriate insight and judgment.  ____________________________________________    LABS (pertinent positives/negatives)  Labs Reviewed  BASIC METABOLIC PANEL - Abnormal; Notable for the following:    Sodium 133 (*)    Glucose, Bld 155 (*)    All other components within normal limits  URINALYSIS COMPLETEWITH MICROSCOPIC ([facility] ONLY) - Abnormal; Notable for the following:    Color, Urine COLORLESS (*)    APPearance CLEAR (*)    Specific Gravity, Urine 1.003 (*)    Hgb urine dipstick 1+ (*)    Squamous Epithelial / LPF 0-5 (*)    All other components within normal limits  CBC  TROPONIN I  TROPONIN I      ____________________________________________   EKG  I, Nance Pear, attending physician, personally viewed and interpreted this EKG  EKG Time: 1354 Rate: 67 Rhythm: atrial fibrillation Axis: left axis deviation Intervals: qtc 464 QRS: LBBB ST changes: no st elevation equivalent.  Impression: abnormal ekg  Atrial fibrillation and LBBB present on EKG dated June 21st, 2016 ____________________________________________    RADIOLOGY  CXR IMPRESSION: 1. Stable emphysematous changes, pulmonary scarring and calcified granulomas. 2. No acute pulmonary findings.  ____________________________________________   PROCEDURES  Procedure(s) performed: None  Critical Care performed: No  ____________________________________________   INITIAL IMPRESSION / ASSESSMENT AND PLAN / ED COURSE  Pertinent labs & imaging results that were available during my care of the patient were reviewed by me and considered in my medical decision making (see chart for details).  Patient presented to the emergency department today because of some weakness, palpitations and shortness of breath. Workup here in the emergency department without any acute or concerning findings. Troponin was checked twice and was negative but times. EKG did show atrial fib with L BBB which the patient has had in the past. Did however show a cane. HEENT junctional beat. I do wonder if the patient is sensing this. It did sound almost like palpitations. Did discuss with patient importance of follow up with cardiology. Patient felt comfortable with plan.  ____________________________________________   FINAL CLINICAL IMPRESSION(S) / ED DIAGNOSES  Final diagnoses:  Atrial fibrillation, unspecified type ([place])     Nance Pear, MD 09/23/15 2049

## 2015-09-26 DIAGNOSIS — R0602 Shortness of breath: Secondary | ICD-10-CM | POA: Diagnosis not present

## 2015-09-26 DIAGNOSIS — R42 Dizziness and giddiness: Secondary | ICD-10-CM | POA: Diagnosis not present

## 2015-09-26 DIAGNOSIS — I482 Chronic atrial fibrillation: Secondary | ICD-10-CM | POA: Diagnosis not present

## 2015-09-26 DIAGNOSIS — I25118 Atherosclerotic heart disease of native coronary artery with other forms of angina pectoris: Secondary | ICD-10-CM | POA: Diagnosis not present

## 2015-10-04 DIAGNOSIS — I4891 Unspecified atrial fibrillation: Secondary | ICD-10-CM | POA: Diagnosis not present

## 2015-10-04 DIAGNOSIS — R42 Dizziness and giddiness: Secondary | ICD-10-CM | POA: Diagnosis not present

## 2015-10-15 DIAGNOSIS — R0602 Shortness of breath: Secondary | ICD-10-CM | POA: Diagnosis not present

## 2015-10-15 DIAGNOSIS — I25118 Atherosclerotic heart disease of native coronary artery with other forms of angina pectoris: Secondary | ICD-10-CM | POA: Diagnosis not present

## 2015-10-15 DIAGNOSIS — I482 Chronic atrial fibrillation: Secondary | ICD-10-CM | POA: Diagnosis not present

## 2015-10-15 DIAGNOSIS — R42 Dizziness and giddiness: Secondary | ICD-10-CM | POA: Diagnosis not present

## 2015-10-15 DIAGNOSIS — I480 Paroxysmal atrial fibrillation: Secondary | ICD-10-CM | POA: Diagnosis not present

## 2015-10-30 ENCOUNTER — Ambulatory Visit (INDEPENDENT_AMBULATORY_CARE_PROVIDER_SITE_OTHER): Payer: Medicare Other | Admitting: Internal Medicine

## 2015-10-30 ENCOUNTER — Ambulatory Visit: Payer: PRIVATE HEALTH INSURANCE | Admitting: Internal Medicine

## 2015-10-30 ENCOUNTER — Encounter: Payer: Self-pay | Admitting: Internal Medicine

## 2015-10-30 VITALS — BP 124/66 | HR 76 | Temp 97.6°F | Resp 12 | Ht 66.0 in | Wt 145.2 lb

## 2015-10-30 DIAGNOSIS — E119 Type 2 diabetes mellitus without complications: Secondary | ICD-10-CM | POA: Diagnosis not present

## 2015-10-30 DIAGNOSIS — Z780 Asymptomatic menopausal state: Secondary | ICD-10-CM

## 2015-10-30 DIAGNOSIS — E785 Hyperlipidemia, unspecified: Secondary | ICD-10-CM

## 2015-10-30 DIAGNOSIS — R5383 Other fatigue: Secondary | ICD-10-CM | POA: Diagnosis not present

## 2015-10-30 DIAGNOSIS — Z0000 Encounter for general adult medical examination without abnormal findings: Secondary | ICD-10-CM

## 2015-10-30 DIAGNOSIS — F339 Major depressive disorder, recurrent, unspecified: Secondary | ICD-10-CM | POA: Insufficient documentation

## 2015-10-30 DIAGNOSIS — I10 Essential (primary) hypertension: Secondary | ICD-10-CM

## 2015-10-30 DIAGNOSIS — F330 Major depressive disorder, recurrent, mild: Secondary | ICD-10-CM

## 2015-10-30 DIAGNOSIS — R5381 Other malaise: Secondary | ICD-10-CM

## 2015-10-30 LAB — COMPREHENSIVE METABOLIC PANEL
ALK PHOS: 68 U/L (ref 39–117)
ALT: 17 U/L (ref 0–35)
AST: 18 U/L (ref 0–37)
Albumin: 4.1 g/dL (ref 3.5–5.2)
BUN: 13 mg/dL (ref 6–23)
CO2: 31 meq/L (ref 19–32)
Calcium: 9.6 mg/dL (ref 8.4–10.5)
Chloride: 102 mEq/L (ref 96–112)
Creatinine, Ser: 0.64 mg/dL (ref 0.40–1.20)
GFR: 93.26 mL/min (ref >60.00)
GLUCOSE: 135 mg/dL — AB (ref 70–99)
POTASSIUM: 4.1 meq/L (ref 3.5–5.1)
SODIUM: 138 meq/L (ref 135–145)
TOTAL PROTEIN: 6.4 g/dL (ref 6.0–8.3)
Total Bilirubin: 0.8 mg/dL (ref 0.2–1.2)

## 2015-10-30 LAB — TSH: TSH: 0.66 u[IU]/mL (ref 0.35–4.50)

## 2015-10-30 LAB — LIPID PANEL
CHOL/HDL RATIO: 4
Cholesterol: 204 mg/dL — ABNORMAL HIGH (ref 0–200)
HDL: 57.8 mg/dL (ref >39.00)
LDL Cholesterol: 128 mg/dL — ABNORMAL HIGH (ref 0–99)
NonHDL: 146.68
TRIGLYCERIDES: 94 mg/dL (ref 0.0–149.0)
VLDL: 18.8 mg/dL (ref 0.0–40.0)

## 2015-10-30 LAB — HEMOGLOBIN A1C: Hgb A1c MFr Bld: 6.6 % — ABNORMAL HIGH (ref 4.6–6.5)

## 2015-10-30 LAB — MICROALBUMIN / CREATININE URINE RATIO
Creatinine,U: 113 mg/dL
Microalb Creat Ratio: 0.9 mg/g (ref 0.0–30.0)
Microalb, Ur: 1 mg/dL (ref 0.0–1.9)

## 2015-10-30 LAB — LDL CHOLESTEROL, DIRECT: Direct LDL: 116 mg/dL

## 2015-10-30 MED ORDER — ESCITALOPRAM OXALATE 10 MG PO TABS: 10.0000 mg | ORAL_TABLET | Freq: Every day | ORAL | Status: DC

## 2015-10-30 MED ORDER — ESOMEPRAZOLE MAGNESIUM 40 MG PO CPDR: 40.0000 mg | DELAYED_RELEASE_CAPSULE | Freq: Every day | ORAL | Status: DC

## 2015-10-30 NOTE — Progress Notes (Signed)
Patient ID: Leah Holmes, female    DOB: Dec 22, 1926  Age: 82 y.o. MRN: [id number]  The patient is here for annual Medicare wellness examination and management of other chronic and acute problems.   The risk factors are reflected in the social history.  The roster of all physicians providing medical care to patient - is listed in the Snapshot section of the chart.  Activities of daily living:  The patient is  independent in all ADLs: dressing, toileting, feeding as well as independent mobility but is supervised by her daughter Home safety : The patient has smoke detectors in the home. They wear seatbelts.  There are no firearms at home. There is no violence in the home.   There is no risks for hepatitis, STDs or HIV. There is no   history of blood transfusion. They have no travel history to infectious disease endemic areas of the world.  The patient has seen their dentist in the last six month. They have seen their eye doctor in the last year. They admit to slight hearing difficulty with regard to whispered voices and some television programs.  They have deferred audiologic testing in the last year.  They do not  have excessive sun exposure. Discussed the need for sun protection: hats, long sleeves and use of sunscreen if there is significant sun exposure.  Using a cane 4 footed,  No falls recently     Diet: the importance of a healthy diet is discussed. They do have a healthy diet.  The benefits of regular aerobic exercise were discussed. She does not walk very far due to fear of falling and use of a can,e , but averages 3 times per week ,  20 minutes.   Depression screen: there are MULTIPLE  symptoms of depression- irritability, change in appetite, anhedonia, sadness/tearfullness.  SCORED 7 ON THE SCREENING TEST  Cognitive assessment: the patient manages all their financial and personal affairs and is actively engaged. They could relate day,date,year and events; recalled 2/3 objects at 3  minutes; performed clock-face test normally.  The following portions of the patient's history were reviewed and updated as appropriate: allergies, current medications, past family history, past medical history,  past surgical history, past social history  and problem list.  Visual acuity was not assessed per patient preference since she has regular follow up with her ophthalmologist. Hearing and body mass index were assessed and reviewed.   During the course of the visit the patient was educated and counseled about appropriate screening and preventive services including : fall prevention , diabetes screening, nutrition counseling, colorectal cancer screening, and recommended immunizations.    CC: The primary encounter diagnosis was Mild episode of recurrent major depressive disorder ([place]). Diagnoses of Diabetes mellitus without complication ([place]), Hyperlipidemia, Postmenopausal estrogen deficiency, Other fatigue, Other malaise, Essential hypertension, benign, and Medicare annual wellness visit, subsequent were also pertinent to this visit.  ER visit i April for increased weakness , palpitations and dyspnea. EKG showed A Fib with BBB, not new.  Rate 67 on atenolol 25 mg daily Was referred to cardiology,  troponins negative x 2.  Had a stress test (cardiolyte) had a bad reaction ahd ECHO all was normal,  Holter Monitor also normal except for nknown atrial fib   oesn't feel good.  Stomach hurts all the time epgiastric.  On prilosec,  wants to switch to nexium   blodd sugars up to 200 after dinner on diet alone.   Not checking mornings,  Off all medss  History Deronda has a past medical history of Diabetes mellitus without complication ([place]); Ulcer; Hyperlipidemia; Hypertension; Polyp of colon; Atrial fibrillation ([place]) (2012); GERD (gastroesophageal reflux disease); Irritable bowel syndrome; Colon polyp (2003); CHF (congestive heart failure) ([place]); and Coronary artery disease.   She has past  surgical history that includes Appendectomy; Breast surgery; Abdominal hysterectomy; Cholecystectomy (03-2012); Colonoscopy (2003); Upper gi endoscopy (2003); fibroid tumor; and Abdominal adhesion surgery.   Her family history includes Cancer (age of onset: 51) in her sister.She reports that she has never smoked. Her smokeless tobacco use includes Snuff. She reports that she does not drink alcohol or use illicit drugs.  Outpatient Prescriptions Prior to Visit  Medication Sig Dispense Refill  . amLODipine (NORVASC) 5 MG tablet     . amLODipine (NORVASC) 5 MG tablet TAKE ONE TABLET BY MOUTH ONCE DAILY 90 tablet 3  . aspirin EC 81 MG tablet Take 81 mg by mouth daily.    Marland Kitchen atenolol (TENORMIN) 25 MG tablet TAKE ONE TABLET BY MOUTH TWICE DAILY 60 tablet 2  . Cholecalciferol (VITAMIN D3) 1000 UNITS CAPS Take 1 capsule by mouth daily.    . ergocalciferol (DRISDOL) 50000 UNITS capsule Take 1 capsule (50,000 Units total) by mouth once a week. 12 capsule 0  . glipiZIDE (GLUCOTROL) 5 MG tablet Take 2.5 mg by mouth daily before breakfast.     . glipiZIDE (GLUCOTROL) 5 MG tablet TAKE ONE TABLET BY MOUTH TWICE DAILY BEFORE  A  MEAL 180 tablet 0  . glucose blood (ONE TOUCH ULTRA TEST) test strip Check sugar bid Dx E11.9 100 each 5  . hydrochlorothiazide (HYDRODIURIL) 25 MG tablet Take 25 mg by mouth every other day.     . isosorbide mononitrate (IMDUR) 60 MG 24 hr tablet Take 60 mg by mouth 2 (two) times daily.     Marland Kitchen lisinopril (PRINIVIL,ZESTRIL) 20 MG tablet TAKE ONE TABLET BY MOUTH TWICE DAILY DOSE  CHANGE 60 tablet 3  . meclizine (ANTIVERT) 25 MG tablet TAKE ONE TABLET BY MOUTH THREE TIMES DAILY AS NEEDED 30 tablet 5  . nitroGLYCERIN (NITROSTAT) 0.4 MG SL tablet Place 0.4 mg under the tongue every 5 (five) minutes as needed for chest pain.    Marland Kitchen nystatin (MYCOSTATIN) 100000 UNIT/ML suspension Take 5 mLs (500,000 Units total) by mouth 4 (four) times daily. 100 mL 0  . omeprazole (PRILOSEC) 20 MG capsule TAKE  ONE CAPSULE BY MOUTH ONCE DAILY 30 capsule 5  . ONETOUCH DELICA LANCETS 99991111 MISC USE ONE LANCET TO CHECK BLOOD SUGAR TWICE DAILY  Dx Code E11.9 100 each 0  . polyethylene glycol (GOLYTELY) 236 g solution Drink 8 ounces every 30 minutes until stool is clear 4000 mL 0  . psyllium (METAMUCIL) 58.6 % packet Take 1 packet by mouth daily.    . simvastatin (ZOCOR) 20 MG tablet Take 20 mg by mouth every other day.    . warfarin (COUMADIN) 2 MG tablet Take 2-4 mg by mouth daily. Pt takes one tablet on Sunday, Tuesday, and Thursday. Pt takes two tablets on Monday, Wednesday, Friday, and Saturday.     No facility-administered medications prior to visit.    Review of Systems   Patient denies headache, fevers,  unintentional weight loss, skin rash, eye pain, sinus congestion and sinus pain, sore throat, dysphagia,  hemoptysis , cough, dyspnea, wheezing, chest pain, palpitations, orthopnea, edema, abdominal pain, nausea, melena, diarrhea, constipation, flank pain, dysuria, hematuria, urinary  Frequency, nocturia, numbness, tingling, seizures,  Focal weakness, Loss of consciousness,  Tremor, insomnia, depression, anxiety, and suicidal ideation.      Objective:  BP 124/66 mmHg  Pulse 76  Temp(Src) 97.6 F (36.4 C) (Oral)  Resp 12  Ht 5\' 6"  (1.676 m)  Wt 145 lb 4 oz (65.885 kg)  BMI 23.46 kg/m2  SpO2 97%  Physical Exam   General appearance: alert, cooperative and appears stated age Head: Normocephalic, without obvious abnormality, atraumatic Eyes: conjunctivae/corneas clear. PERRL, EOM's intact. Fundi benign. Ears: normal TM's and external ear canals both ears Nose: Nares normal. Septum midline. Mucosa normal. No drainage or sinus tenderness. Throat: lips, mucosa, and tongue normal; teeth and gums normal Neck: no adenopathy, no carotid bruit, no JVD, supple, symmetrical, trachea midline and thyroid not enlarged, symmetric, no tenderness/mass/nodules Lungs: clear to auscultation  bilaterally Breasts: normal appearance, no masses or tenderness Heart: regular rate and rhythm, S1, S2 normal, no murmur, click, rub or gallop Abdomen: soft, non-tender; bowel sounds normal; no masses,  no organomegaly Extremities: extremities normal, atraumatic, no cyanosis or edema Pulses: 2+ and symmetric Skin: Skin color, texture, turgor normal. No rashes or lesions Neurologic: Alert and oriented X 3, normal strength and tone. Normal symmetric reflexes. Normal coordination and gait.     Assessment & Plan:   Problem List Items Addressed This Visit    Diabetes mellitus without complication ([place])    Despite occasional BS near 200, her a1c is at goal. Without medications.  Will advise to stay off glipizide  Lab Results  Component Value Date   HGBA1C 6.6* 10/30/2015   Lab Results  Component Value Date   MICROALBUR 1.0 10/30/2015         Relevant Orders   Comprehensive metabolic panel (Completed)   Hemoglobin A1c (Completed)   Microalbumin / creatinine urine ratio (Completed)   Essential hypertension, benign    Improved since resuming 5 mg daily   Lab Results  Component Value Date   CREATININE 0.64 10/30/2015  \ Lab Results  Component Value Date   NA 138 10/30/2015   K 4.1 10/30/2015   CL 102 10/30/2015   CO2 31 10/30/2015          Medicare annual wellness visit, subsequent    Annual Medicare wellness  exam was done as well as a comprehensive physical exam and management of acute and chronic conditions .  During the course of the visit the patient was educated and counseled about appropriate screening and preventive services including : fall prevention , diabetes screening, nutrition counseling, colorectal cancer screening, and recommended immunizations.  Printed recommendations for health maintenance screenings was given.       Major depressive disorder, recurrent episode ([place]) - Primary    Trial of lexapro agreed to.       Relevant Medications   escitalopram  (LEXAPRO) 10 MG tablet   Other malaise    Given her age and persistent complaints of malaise, recommended suspending statin therapy for 3 months to see if symptoms improve.        Other Visit Diagnoses    Hyperlipidemia        Relevant Orders    LDL cholesterol, direct (Completed)    Lipid panel (Completed)    Postmenopausal estrogen deficiency        Relevant Orders    DG Bone Density    Other fatigue        Relevant Orders    TSH (Completed)       I am having Ms. Suchocki start on esomeprazole and escitalopram. I am  also having her maintain her hydrochlorothiazide, isosorbide mononitrate, nitroGLYCERIN, nystatin, meclizine, aspirin EC, simvastatin, warfarin, psyllium, glipiZIDE, ONETOUCH DELICA LANCETS 99991111, glucose blood, Vitamin D3, amLODipine, ergocalciferol, glipiZIDE, omeprazole, amLODipine, polyethylene glycol, atenolol, and lisinopril.  Meds ordered this encounter  Medications  . esomeprazole (NEXIUM) 40 MG capsule    Sig: Take 1 capsule (40 mg total) by mouth daily.    Dispense:  30 capsule    Refill:  3  . escitalopram (LEXAPRO) 10 MG tablet    Sig: Take 1 tablet (10 mg total) by mouth daily.    Dispense:  30 tablet    Refill:  2    There are no discontinued medications.  Follow-up: No Follow-up on file.   Crecencio Mc, MD

## 2015-10-30 NOTE — Progress Notes (Signed)
Pre-visit discussion using our clinic review tool. No additional management support is needed unless otherwise documented below in the visit note.  

## 2015-10-30 NOTE — Patient Instructions (Signed)
I have changed your stomach medicine to Nexium 40 mg daily  i am recommend a trial of Lexapro for your depressed mood.  Start with 1/2 tablet daily after dinner for the first week ,  Then increase to a full tablet.  Please start checking your fasting blood sugars for the next two weeks.    I have ordered a bone density test   Menopause is a normal process in which your reproductive ability comes to an end. This process happens gradually over a span of months to years, usually between the ages of 61 and 21. Menopause is complete when you have missed 12 consecutive menstrual periods. It is important to talk with your health care provider about some of the most common conditions that affect postmenopausal women, such as heart disease, cancer, and bone loss (osteoporosis). Adopting a healthy lifestyle and getting preventive care can help to promote your health and wellness. Those actions can also lower your chances of developing some of these common conditions. WHAT SHOULD I KNOW ABOUT MENOPAUSE? During menopause, you may experience a number of symptoms, such as:  Moderate-to-severe hot flashes.  Night sweats.  Decrease in sex drive.  Mood swings.  Headaches.  Tiredness.  Irritability.  Memory problems.  Insomnia. Choosing to treat or not to treat menopausal changes is an individual decision that you make with your health care provider. WHAT SHOULD I KNOW ABOUT HORMONE REPLACEMENT THERAPY AND SUPPLEMENTS? Hormone therapy products are effective for treating symptoms that are associated with menopause, such as hot flashes and night sweats. Hormone replacement carries certain risks, especially as you become older. If you are thinking about using estrogen or estrogen with progestin treatments, discuss the benefits and risks with your health care provider. WHAT SHOULD I KNOW ABOUT HEART DISEASE AND STROKE? Heart disease, heart attack, and stroke become more likely as you age. This may be  due, in part, to the hormonal changes that your body experiences during menopause. These can affect how your body processes dietary fats, triglycerides, and cholesterol. Heart attack and stroke are both medical emergencies. There are many things that you can do to help prevent heart disease and stroke:  Have your blood pressure checked at least every 1-2 years. High blood pressure causes heart disease and increases the risk of stroke.  If you are 45-43 years old, ask your health care provider if you should take aspirin to prevent a heart attack or a stroke.  Do not use any tobacco products, including cigarettes, chewing tobacco, or electronic cigarettes. If you need help quitting, ask your health care provider.  It is important to eat a healthy diet and maintain a healthy weight.  Be sure to include plenty of vegetables, fruits, low-fat dairy products, and lean protein.  Avoid eating foods that are high in solid fats, added sugars, or salt (sodium).  Get regular exercise. This is one of the most important things that you can do for your health.  Try to exercise for at least 150 minutes each week. The type of exercise that you do should increase your heart rate and make you sweat. This is known as moderate-intensity exercise.  Try to do strengthening exercises at least twice each week. Do these in addition to the moderate-intensity exercise.  Know your numbers.Ask your health care provider to check your cholesterol and your blood glucose. Continue to have your blood tested as directed by your health care provider. WHAT SHOULD I KNOW ABOUT CANCER SCREENING? There are several types of cancer.  Take the following steps to reduce your risk and to catch any cancer development as early as possible. Breast Cancer  Practice breast self-awareness.  This means understanding how your breasts normally appear and feel.  It also means doing regular breast self-exams. Let your health care provider know  about any changes, no matter how small.  If you are 52 or older, have a clinician do a breast exam (clinical breast exam or CBE) every year. Depending on your age, family history, and medical history, it may be recommended that you also have a yearly breast X-ray (mammogram).  If you have a family history of breast cancer, talk with your health care provider about genetic screening.  If you are at high risk for breast cancer, talk with your health care provider about having an MRI and a mammogram every year.  Breast cancer (BRCA) gene test is recommended for women who have family members with BRCA-related cancers. Results of the assessment will determine the need for genetic counseling and BRCA1 and for BRCA2 testing. BRCA-related cancers include these types:  Breast. This occurs in males or females.  Ovarian.  Tubal. This may also be called fallopian tube cancer.  Cancer of the abdominal or pelvic lining (peritoneal cancer).  Prostate.  Pancreatic. Cervical, Uterine, and Ovarian Cancer Your health care provider may recommend that you be screened regularly for cancer of the pelvic organs. These include your ovaries, uterus, and vagina. This screening involves a pelvic exam, which includes checking for microscopic changes to the surface of your cervix (Pap test).  For women ages 21-65, health care providers may recommend a pelvic exam and a Pap test every three years. For women ages 71-65, they may recommend the Pap test and pelvic exam, combined with testing for human papilloma virus (HPV), every five years. Some types of HPV increase your risk of cervical cancer. Testing for HPV may also be done on women of any age who have unclear Pap test results.  Other health care providers may not recommend any screening for nonpregnant women who are considered low risk for pelvic cancer and have no symptoms. Ask your health care provider if a screening pelvic exam is right for you.  If you have had  past treatment for cervical cancer or a condition that could lead to cancer, you need Pap tests and screening for cancer for at least 20 years after your treatment. If Pap tests have been discontinued for you, your risk factors (such as having a new sexual partner) need to be reassessed to determine if you should start having screenings again. Some women have medical problems that increase the chance of getting cervical cancer. In these cases, your health care provider may recommend that you have screening and Pap tests more often.  If you have a family history of uterine cancer or ovarian cancer, talk with your health care provider about genetic screening.  If you have vaginal bleeding after reaching menopause, tell your health care provider.  There are currently no reliable tests available to screen for ovarian cancer. Lung Cancer Lung cancer screening is recommended for adults 61-56 years old who are at high risk for lung cancer because of a history of smoking. A yearly low-dose CT scan of the lungs is recommended if you:  Currently smoke.  Have a history of at least 30 pack-years of smoking and you currently smoke or have quit within the past 15 years. A pack-year is smoking an average of one pack of cigarettes per day  for one year. Yearly screening should:  Continue until it has been 15 years since you quit.  Stop if you develop a health problem that would prevent you from having lung cancer treatment. Colorectal Cancer  This type of cancer can be detected and can often be prevented.  Routine colorectal cancer screening usually begins at age 26 and continues through age 4.  If you have risk factors for colon cancer, your health care provider may recommend that you be screened at an earlier age.  If you have a family history of colorectal cancer, talk with your health care provider about genetic screening.  Your health care provider may also recommend using home test kits to check  for hidden blood in your stool.  A small camera at the end of a tube can be used to examine your colon directly (sigmoidoscopy or colonoscopy). This is done to check for the earliest forms of colorectal cancer.  Direct examination of the colon should be repeated every 5-10 years until age 24. However, if early forms of precancerous polyps or small growths are found or if you have a family history or genetic risk for colorectal cancer, you may need to be screened more often. Skin Cancer  Check your skin from head to toe regularly.  Monitor any moles. Be sure to tell your health care provider:  About any new moles or changes in moles, especially if there is a change in a mole's shape or color.  If you have a mole that is larger than the size of a pencil eraser.  If any of your family members has a history of skin cancer, especially at a young age, talk with your health care provider about genetic screening.  Always use sunscreen. Apply sunscreen liberally and repeatedly throughout the day.  Whenever you are outside, protect yourself by wearing long sleeves, pants, a wide-brimmed hat, and sunglasses. WHAT SHOULD I KNOW ABOUT OSTEOPOROSIS? Osteoporosis is a condition in which bone destruction happens more quickly than new bone creation. After menopause, you may be at an increased risk for osteoporosis. To help prevent osteoporosis or the bone fractures that can happen because of osteoporosis, the following is recommended:  If you are 48-60 years old, get at least 1,000 mg of calcium and at least 600 mg of vitamin D per day.  If you are older than age 91 but younger than age 62, get at least 1,200 mg of calcium and at least 600 mg of vitamin D per day.  If you are older than age 70, get at least 1,200 mg of calcium and at least 800 mg of vitamin D per day. Smoking and excessive alcohol intake increase the risk of osteoporosis. Eat foods that are rich in calcium and vitamin D, and do  weight-bearing exercises several times each week as directed by your health care provider. WHAT SHOULD I KNOW ABOUT HOW MENOPAUSE AFFECTS [place]? Depression may occur at any age, but it is more common as you become older. Common symptoms of depression include:  Low or sad mood.  Changes in sleep patterns.  Changes in appetite or eating patterns.  Feeling an overall lack of motivation or enjoyment of activities that you previously enjoyed.  Frequent crying spells. Talk with your health care provider if you think that you are experiencing depression. WHAT SHOULD I KNOW ABOUT IMMUNIZATIONS? It is important that you get and maintain your immunizations. These include:  Tetanus, diphtheria, and pertussis (Tdap) booster vaccine.  Influenza every year  before the flu season begins.  Pneumonia vaccine.  Shingles vaccine. Your health care provider may also recommend other immunizations.   This information is not intended to replace advice given to you by your health care provider. Make sure you discuss any questions you have with your health care provider.   Document Released: 07/24/2005 Document Revised: 06/22/2014 Document Reviewed: 02/01/2014 Elsevier Interactive Patient Education [employer].

## 2015-10-31 ENCOUNTER — Encounter: Payer: Self-pay | Admitting: Internal Medicine

## 2015-11-01 DIAGNOSIS — R5381 Other malaise: Secondary | ICD-10-CM | POA: Insufficient documentation

## 2015-11-01 NOTE — Assessment & Plan Note (Signed)
Given her age and persistent complaints of malaise, recommended suspending statin therapy for 3 months to see if symptoms improve.

## 2015-11-01 NOTE — Assessment & Plan Note (Signed)

## 2015-11-01 NOTE — Assessment & Plan Note (Signed)
Improved since resuming 5 mg daily   Lab Results  Component Value Date   CREATININE 0.64 10/30/2015  \ Lab Results  Component Value Date   NA 138 10/30/2015   K 4.1 10/30/2015   CL 102 10/30/2015   CO2 31 10/30/2015

## 2015-11-01 NOTE — Assessment & Plan Note (Signed)
Despite occasional BS near 200, her a1c is at goal. Without medications.  Will advise to stay off glipizide  Lab Results  Component Value Date   HGBA1C 6.6* 10/30/2015   Lab Results  Component Value Date   MICROALBUR 1.0 10/30/2015

## 2015-11-01 NOTE — Assessment & Plan Note (Signed)
Trial of lexapro agreed to.

## 2015-11-12 DIAGNOSIS — I517 Cardiomegaly: Secondary | ICD-10-CM | POA: Diagnosis not present

## 2015-11-12 DIAGNOSIS — I480 Paroxysmal atrial fibrillation: Secondary | ICD-10-CM | POA: Diagnosis not present

## 2015-11-12 DIAGNOSIS — I482 Chronic atrial fibrillation: Secondary | ICD-10-CM | POA: Diagnosis not present

## 2015-11-12 DIAGNOSIS — I25118 Atherosclerotic heart disease of native coronary artery with other forms of angina pectoris: Secondary | ICD-10-CM | POA: Diagnosis not present

## 2015-11-14 ENCOUNTER — Other Ambulatory Visit: Payer: Self-pay | Admitting: Internal Medicine

## 2015-12-10 DIAGNOSIS — I25118 Atherosclerotic heart disease of native coronary artery with other forms of angina pectoris: Secondary | ICD-10-CM | POA: Diagnosis not present

## 2015-12-10 DIAGNOSIS — R0602 Shortness of breath: Secondary | ICD-10-CM | POA: Diagnosis not present

## 2015-12-10 DIAGNOSIS — I4891 Unspecified atrial fibrillation: Secondary | ICD-10-CM | POA: Diagnosis not present

## 2015-12-10 DIAGNOSIS — I482 Chronic atrial fibrillation: Secondary | ICD-10-CM | POA: Diagnosis not present

## 2015-12-10 DIAGNOSIS — I071 Rheumatic tricuspid insufficiency: Secondary | ICD-10-CM | POA: Diagnosis not present

## 2015-12-10 DIAGNOSIS — E876 Hypokalemia: Secondary | ICD-10-CM | POA: Diagnosis not present

## 2015-12-10 DIAGNOSIS — I10 Essential (primary) hypertension: Secondary | ICD-10-CM | POA: Diagnosis not present

## 2015-12-10 DIAGNOSIS — I340 Nonrheumatic mitral (valve) insufficiency: Secondary | ICD-10-CM | POA: Diagnosis not present

## 2015-12-10 DIAGNOSIS — E782 Mixed hyperlipidemia: Secondary | ICD-10-CM | POA: Diagnosis not present

## 2015-12-12 ENCOUNTER — Other Ambulatory Visit: Payer: Self-pay | Admitting: Internal Medicine

## 2016-01-07 DIAGNOSIS — I4891 Unspecified atrial fibrillation: Secondary | ICD-10-CM | POA: Diagnosis not present

## 2016-01-09 ENCOUNTER — Observation Stay
Admission: EM | Admit: 2016-01-09 | Discharge: 2016-01-10 | Disposition: A | Discharge status: Home / Self Care | Payer: Medicare Other | Attending: Internal Medicine | Admitting: Internal Medicine

## 2016-01-09 ENCOUNTER — Encounter: Payer: Self-pay | Admitting: Emergency Medicine

## 2016-01-09 ENCOUNTER — Emergency Department: Payer: Medicare Other

## 2016-01-09 DIAGNOSIS — I10 Essential (primary) hypertension: Secondary | ICD-10-CM | POA: Diagnosis not present

## 2016-01-09 DIAGNOSIS — K219 Gastro-esophageal reflux disease without esophagitis: Secondary | ICD-10-CM | POA: Insufficient documentation

## 2016-01-09 DIAGNOSIS — Z888 Allergy status to other drugs, medicaments and biological substances status: Secondary | ICD-10-CM | POA: Insufficient documentation

## 2016-01-09 DIAGNOSIS — F411 Generalized anxiety disorder: Secondary | ICD-10-CM | POA: Diagnosis not present

## 2016-01-09 DIAGNOSIS — I447 Left bundle-branch block, unspecified: Secondary | ICD-10-CM | POA: Insufficient documentation

## 2016-01-09 DIAGNOSIS — I2510 Atherosclerotic heart disease of native coronary artery without angina pectoris: Secondary | ICD-10-CM | POA: Insufficient documentation

## 2016-01-09 DIAGNOSIS — R531 Weakness: Secondary | ICD-10-CM | POA: Diagnosis not present

## 2016-01-09 DIAGNOSIS — Z9049 Acquired absence of other specified parts of digestive tract: Secondary | ICD-10-CM | POA: Insufficient documentation

## 2016-01-09 DIAGNOSIS — T447X5A Adverse effect of beta-adrenoreceptor antagonists, initial encounter: Secondary | ICD-10-CM | POA: Insufficient documentation

## 2016-01-09 DIAGNOSIS — E782 Mixed hyperlipidemia: Secondary | ICD-10-CM | POA: Diagnosis not present

## 2016-01-09 DIAGNOSIS — R0602 Shortness of breath: Secondary | ICD-10-CM | POA: Diagnosis not present

## 2016-01-09 DIAGNOSIS — Z885 Allergy status to narcotic agent status: Secondary | ICD-10-CM | POA: Insufficient documentation

## 2016-01-09 DIAGNOSIS — Z7901 Long term (current) use of anticoagulants: Secondary | ICD-10-CM | POA: Insufficient documentation

## 2016-01-09 DIAGNOSIS — I509 Heart failure, unspecified: Secondary | ICD-10-CM | POA: Diagnosis not present

## 2016-01-09 DIAGNOSIS — R001 Bradycardia, unspecified: Secondary | ICD-10-CM | POA: Diagnosis not present

## 2016-01-09 DIAGNOSIS — Z803 Family history of malignant neoplasm of breast: Secondary | ICD-10-CM | POA: Diagnosis not present

## 2016-01-09 DIAGNOSIS — F17290 Nicotine dependence, other tobacco product, uncomplicated: Secondary | ICD-10-CM | POA: Insufficient documentation

## 2016-01-09 DIAGNOSIS — E1169 Type 2 diabetes mellitus with other specified complication: Secondary | ICD-10-CM | POA: Insufficient documentation

## 2016-01-09 DIAGNOSIS — I517 Cardiomegaly: Secondary | ICD-10-CM | POA: Diagnosis not present

## 2016-01-09 DIAGNOSIS — I340 Nonrheumatic mitral (valve) insufficiency: Secondary | ICD-10-CM | POA: Diagnosis not present

## 2016-01-09 DIAGNOSIS — I110 Hypertensive heart disease with heart failure: Secondary | ICD-10-CM | POA: Diagnosis not present

## 2016-01-09 DIAGNOSIS — R1310 Dysphagia, unspecified: Secondary | ICD-10-CM | POA: Insufficient documentation

## 2016-01-09 DIAGNOSIS — I700 Atherosclerosis of aorta: Secondary | ICD-10-CM | POA: Insufficient documentation

## 2016-01-09 DIAGNOSIS — K589 Irritable bowel syndrome without diarrhea: Secondary | ICD-10-CM | POA: Insufficient documentation

## 2016-01-09 DIAGNOSIS — F339 Major depressive disorder, recurrent, unspecified: Secondary | ICD-10-CM | POA: Insufficient documentation

## 2016-01-09 DIAGNOSIS — K625 Hemorrhage of anus and rectum: Secondary | ICD-10-CM | POA: Diagnosis not present

## 2016-01-09 DIAGNOSIS — E785 Hyperlipidemia, unspecified: Secondary | ICD-10-CM | POA: Diagnosis not present

## 2016-01-09 DIAGNOSIS — Z86010 Personal history of colonic polyps: Secondary | ICD-10-CM | POA: Insufficient documentation

## 2016-01-09 DIAGNOSIS — Z90710 Acquired absence of both cervix and uterus: Secondary | ICD-10-CM | POA: Diagnosis not present

## 2016-01-09 DIAGNOSIS — Z79899 Other long term (current) drug therapy: Secondary | ICD-10-CM | POA: Insufficient documentation

## 2016-01-09 DIAGNOSIS — I071 Rheumatic tricuspid insufficiency: Secondary | ICD-10-CM | POA: Diagnosis not present

## 2016-01-09 DIAGNOSIS — K620 Anal polyp: Secondary | ICD-10-CM | POA: Diagnosis not present

## 2016-01-09 DIAGNOSIS — E876 Hypokalemia: Secondary | ICD-10-CM | POA: Diagnosis not present

## 2016-01-09 DIAGNOSIS — R42 Dizziness and giddiness: Secondary | ICD-10-CM | POA: Diagnosis not present

## 2016-01-09 DIAGNOSIS — I482 Chronic atrial fibrillation: Secondary | ICD-10-CM | POA: Insufficient documentation

## 2016-01-09 DIAGNOSIS — I25118 Atherosclerotic heart disease of native coronary artery with other forms of angina pectoris: Secondary | ICD-10-CM | POA: Diagnosis not present

## 2016-01-09 DIAGNOSIS — Z7982 Long term (current) use of aspirin: Secondary | ICD-10-CM | POA: Insufficient documentation

## 2016-01-09 DIAGNOSIS — Z880 Allergy status to penicillin: Secondary | ICD-10-CM | POA: Insufficient documentation

## 2016-01-09 DIAGNOSIS — R5383 Other fatigue: Secondary | ICD-10-CM | POA: Diagnosis not present

## 2016-01-09 LAB — CBC WITH DIFFERENTIAL/PLATELET
BASOS PCT: 1 %
Basophils Absolute: 0 10*3/uL (ref 0–0.1)
EOS ABS: 0.1 10*3/uL (ref 0–0.7)
EOS PCT: 1 %
HCT: 43.8 % (ref 35.0–47.0)
Hemoglobin: 14.8 g/dL (ref 12.0–16.0)
Lymphocytes Relative: 25 %
Lymphs Abs: 1.3 10*3/uL (ref 1.0–3.6)
MCH: 31.1 pg (ref 26.0–34.0)
MCHC: 33.7 g/dL (ref 32.0–36.0)
MCV: 92.2 fL (ref 80.0–100.0)
MONO ABS: 0.5 10*3/uL (ref 0.2–0.9)
MONOS PCT: 10 %
Neutro Abs: 3.3 10*3/uL (ref 1.4–6.5)
Neutrophils Relative %: 63 %
Platelets: 208 10*3/uL (ref 150–440)
RBC: 4.75 MIL/uL (ref 3.80–5.20)
RDW: 13.4 % (ref 11.5–14.5)
WBC: 5.2 10*3/uL (ref 3.6–11.0)

## 2016-01-09 LAB — URINALYSIS COMPLETE WITH MICROSCOPIC (ARMC ONLY)
BILIRUBIN URINE: NEGATIVE
Bacteria, UA: NONE SEEN
GLUCOSE, UA: NEGATIVE mg/dL
HGB URINE DIPSTICK: NEGATIVE
Ketones, ur: NEGATIVE mg/dL
LEUKOCYTES UA: NEGATIVE
NITRITE: NEGATIVE
PH: 6 (ref 5.0–8.0)
Protein, ur: NEGATIVE mg/dL
RBC / HPF: NONE SEEN RBC/hpf (ref 0–5)
SPECIFIC GRAVITY, URINE: 1.006 (ref 1.005–1.030)

## 2016-01-09 LAB — COMPREHENSIVE METABOLIC PANEL
ALT: 22 U/L (ref 14–54)
ANION GAP: 7 (ref 5–15)
AST: 43 U/L — ABNORMAL HIGH (ref 15–41)
Albumin: 4 g/dL (ref 3.5–5.0)
Alkaline Phosphatase: 62 U/L (ref 38–126)
BILIRUBIN TOTAL: 1.1 mg/dL (ref 0.3–1.2)
BUN: 9 mg/dL (ref 6–20)
CO2: 28 mmol/L (ref 22–32)
Calcium: 9.5 mg/dL (ref 8.9–10.3)
Chloride: 101 mmol/L (ref 101–111)
Creatinine, Ser: 0.61 mg/dL (ref 0.44–1.00)
GFR calc non Af Amer: >60 mL/min (ref >60)
GLUCOSE: 148 mg/dL — AB (ref 65–99)
POTASSIUM: 3.9 mmol/L (ref 3.5–5.1)
SODIUM: 136 mmol/L (ref 135–145)
TOTAL PROTEIN: 7 g/dL (ref 6.5–8.1)

## 2016-01-09 LAB — PROTIME-INR
INR: 1.7
Prothrombin Time: 20.2 seconds — ABNORMAL HIGH (ref 11.4–15.2)

## 2016-01-09 LAB — TSH: TSH: 0.745 u[IU]/mL (ref 0.350–4.500)

## 2016-01-09 MED ORDER — WARFARIN - PHARMACIST DOSING INPATIENT: Freq: Every day | Status: DC

## 2016-01-09 MED ORDER — VITAMIN D3 25 MCG (1000 UT) PO CAPS
1.0000 | ORAL_CAPSULE | Freq: Every day | ORAL | Status: DC
  Filled 2016-01-09: qty 1

## 2016-01-09 MED ORDER — ERGOCALCIFEROL 1.25 MG (50000 UT) PO CAPS: 50000.0000 [IU] | ORAL_CAPSULE | ORAL | Status: DC

## 2016-01-09 MED ORDER — ESCITALOPRAM OXALATE 10 MG PO TABS: 10.0000 mg | ORAL_TABLET | Freq: Every day | ORAL | Status: DC

## 2016-01-09 MED ORDER — HYDROCHLOROTHIAZIDE 25 MG PO TABS: 25.0000 mg | ORAL_TABLET | ORAL | Status: DC

## 2016-01-09 MED ORDER — ASPIRIN EC 81 MG PO TBEC
81.0000 mg | DELAYED_RELEASE_TABLET | Freq: Every day | ORAL | Status: DC
  Administered 2016-01-10: 81 mg via ORAL
  Filled 2016-01-09: qty 1

## 2016-01-09 MED ORDER — AMLODIPINE BESYLATE 5 MG PO TABS
5.0000 mg | ORAL_TABLET | Freq: Every day | ORAL | Status: DC
  Administered 2016-01-09: 5 mg via ORAL
  Filled 2016-01-09: qty 1

## 2016-01-09 MED ORDER — NITROGLYCERIN 0.4 MG SL SUBL: 0.4000 mg | SUBLINGUAL_TABLET | SUBLINGUAL | Status: DC | PRN

## 2016-01-09 MED ORDER — ISOSORBIDE MONONITRATE ER 60 MG PO TB24
60.0000 mg | ORAL_TABLET | Freq: Two times a day (BID) | ORAL | Status: DC
  Administered 2016-01-09 – 2016-01-10 (×2): 60 mg via ORAL
  Filled 2016-01-09 (×3): qty 1

## 2016-01-09 MED ORDER — ESCITALOPRAM OXALATE 10 MG PO TABS
10.0000 mg | ORAL_TABLET | Freq: Every day | ORAL | Status: DC
  Filled 2016-01-09: qty 1

## 2016-01-09 MED ORDER — SODIUM CHLORIDE 0.9% FLUSH
3.0000 mL | Freq: Two times a day (BID) | INTRAVENOUS | Status: DC
  Administered 2016-01-09 – 2016-01-10 (×3): 3 mL via INTRAVENOUS

## 2016-01-09 MED ORDER — LISINOPRIL 10 MG PO TABS
10.0000 mg | ORAL_TABLET | Freq: Every day | ORAL | Status: DC
  Administered 2016-01-09: 10 mg via ORAL
  Filled 2016-01-09: qty 1

## 2016-01-09 MED ORDER — WARFARIN SODIUM 4 MG PO TABS: 4.0000 mg | ORAL_TABLET | Freq: Every day | ORAL | Status: DC

## 2016-01-09 MED ORDER — PANTOPRAZOLE SODIUM 40 MG PO TBEC
40.0000 mg | DELAYED_RELEASE_TABLET | Freq: Every day | ORAL | Status: DC
  Administered 2016-01-09 – 2016-01-10 (×2): 40 mg via ORAL
  Filled 2016-01-09 (×2): qty 1

## 2016-01-09 MED ORDER — MECLIZINE HCL 25 MG PO TABS: 25.0000 mg | ORAL_TABLET | Freq: Three times a day (TID) | ORAL | Status: DC | PRN

## 2016-01-09 MED ORDER — WARFARIN SODIUM 5 MG PO TABS
5.0000 mg | ORAL_TABLET | Freq: Once | ORAL | Status: AC
  Administered 2016-01-09: 5 mg via ORAL
  Filled 2016-01-09: qty 1

## 2016-01-09 NOTE — Care Management Obs Status (Signed)
[facility] NOTIFICATION   Patient Details  Name: Saniyah Keup MRN: [id number] Date of Birth: August 09, 1926   Medicare Observation Status Notification Given:  Yes    Katrina Stack, RN 01/09/2016, 4:54 PM

## 2016-01-09 NOTE — ED Provider Notes (Signed)
[facility] Emergency Department Provider Note   ____________________________________________   First MD Initiated Contact with Patient 01/09/16 1035     (approximate)  I have reviewed the triage vital signs and the nursing notes.   HISTORY  Chief Complaint Chest Pain; Weakness; and Dizziness    HPI Leah Holmes is a 82 y.o. female with history of afib on coumadin, CHF, DM, GERD, HTN, HLD who presents for evaluation of generalized weakness, low heart rate, intermittent near syncope over the past several days, gradual onset, intermittent, moderate, worse when she stands up and moves around. She was seen by Dr. Philemon Kingdom nurse practitioner this morning and given low heart rate and near fainting episode in the office, she was sent to the emergency department for evaluation. No Fevers or chills. No vomiting or diarrhea per she did have some chest discomfort yesterday which was pleuritic. No chest discomfort currently. She has had intermittent shortness of breath.   Past Medical History:  Diagnosis Date  . Atrial fibrillation (HCC) 2012  . CHF (congestive heart failure) (HCC)   . Colon polyp 2003  . Coronary artery disease   . Diabetes mellitus without complication (HCC)   . GERD (gastroesophageal reflux disease)   . Hyperlipidemia   . Hypertension   . Irritable bowel syndrome   . Polyp of colon   . Ulcer     Patient Active Problem List   Diagnosis Date Noted  . Other malaise 11/01/2015  . Major depressive disorder, recurrent episode (HCC) 10/30/2015  . Constipation 07/27/2015  . Generalized anxiety disorder 04/02/2015  . Ischemic chest pain (HCC) 01/08/2015  . Atrial fibrillation (HCC) 08/16/2014  . Medicare annual wellness visit, subsequent 05/23/2014  . Dry mouth 03/10/2014  . Low serum vitamin D 02/16/2014  . Rectal bleeding 09/20/2013  . Dysphagia, unspecified(787.20) 09/20/2013  . Anal polyp 09/20/2013  . Personal history of colonic  polyps 08/31/2013  . CAD (coronary artery disease) 03/09/2013  . Diabetes mellitus without complication (HCC) 03/09/2013  . Hyperlipidemia associated with type 2 diabetes mellitus (HCC) 03/09/2013  . Essential hypertension, benign 03/09/2013  . Nonulcer dyspepsia 03/09/2013    Past Surgical History:  Procedure Laterality Date  . ABDOMINAL ADHESION SURGERY    . ABDOMINAL HYSTERECTOMY    . APPENDECTOMY    . BREAST SURGERY    . CHOLECYSTECTOMY  03-2012  . COLONOSCOPY  2003   Dr Fritzi Mandes in [place]  . fibroid tumor    . UPPER GI ENDOSCOPY  2003   Dr Fritzi Mandes in [place]    Prior to Admission medications   Medication Sig Start Date End Date Taking? Authorizing Provider  amLODipine (NORVASC) 5 MG tablet TAKE ONE TABLET BY MOUTH ONCE DAILY 07/16/15  Yes Sherlene Shams, MD  aspirin EC 81 MG tablet Take 81 mg by mouth daily.   Yes Historical Provider, MD  atenolol (TENORMIN) 25 MG tablet TAKE ONE TABLET BY MOUTH TWICE DAILY 12/12/15  Yes Sherlene Shams, MD  Cholecalciferol (VITAMIN D3) 1000 UNITS CAPS Take 1 capsule by mouth daily.   Yes Historical Provider, MD  hydrochlorothiazide (HYDRODIURIL) 25 MG tablet Take 25 mg by mouth every other day.    Yes Historical Provider, MD  isosorbide mononitrate (IMDUR) 60 MG 24 hr tablet Take 60 mg by mouth 2 (two) times daily.    Yes Historical Provider, MD  lisinopril (PRINIVIL,ZESTRIL) 20 MG tablet TAKE ONE TABLET BY MOUTH TWICE DAILY DOSE  CHANGE 09/20/15  Yes Sherlene Shams, MD  meclizine (  ANTIVERT) 25 MG tablet TAKE ONE TABLET BY MOUTH THREE TIMES DAILY AS NEEDED 12/12/15  Yes Crecencio Mc, MD  nitroGLYCERIN (NITROSTAT) 0.4 MG SL tablet Place 0.4 mg under the tongue every 5 (five) minutes as needed for chest pain.   Yes Historical Provider, MD  omeprazole (PRILOSEC) 20 MG capsule TAKE ONE CAPSULE BY MOUTH ONCE DAILY 06/19/15  Yes Crecencio Mc, MD  psyllium (METAMUCIL) 58.6 % packet Take 1 packet by mouth daily.   Yes Historical Provider, MD  warfarin  (COUMADIN) 2 MG tablet Take 4 mg by mouth daily.    Yes Historical Provider, MD  ergocalciferol (DRISDOL) 50000 UNITS capsule Take 1 capsule (50,000 Units total) by mouth once a week. 03/17/15   Crecencio Mc, MD  escitalopram (LEXAPRO) 10 MG tablet Take 1 tablet (10 mg total) by mouth daily. 10/30/15   Crecencio Mc, MD  glucose blood (ONE TOUCH ULTRA TEST) test strip Check sugar bid Dx E11.9 02/22/15   Crecencio Mc, MD  [facility] DELICA LANCETS [id number] MISC USE ONE LANCET TO CHECK BLOOD SUGAR TWICE DAILY  Dx Code E11.9 02/21/15   Crecencio Mc, MD    Allergies Codeine; Penicillins; Clonidine derivatives; Gabapentin; Losartan; Morphine and related; Reglan [metoclopramide]; and Hydralazine  Family History  Problem Relation Age of Onset  . Cancer Sister 56    breast    Social History Social History  Substance Use Topics  . Smoking status: Never Smoker  . Smokeless tobacco: Current User    Types: Snuff     Comment: occasionally  . Alcohol use No    Review of Systems Constitutional: No fever/chills Eyes: No visual changes. ENT: No sore throat. Cardiovascular: + chest pain. Respiratory: + shortness of breath. Gastrointestinal: No abdominal pain.  No nausea, no vomiting.  No diarrhea.  No constipation. Genitourinary: Negative for dysuria. Musculoskeletal: Negative for back pain. Skin: Negative for rash. Neurological: Negative for headaches, focal weakness or numbness.  10-point ROS otherwise negative.  ____________________________________________   PHYSICAL EXAM:  VITAL SIGNS: ED Triage Vitals  Enc Vitals Group     BP 01/09/16 0949 (!) 145/60     Pulse Rate 01/09/16 0949 62     Resp 01/09/16 0949 (!) 22     Temp 01/09/16 0949 97.7 F (36.5 C)     Temp Source 01/09/16 0949 Oral     SpO2 01/09/16 0949 99 %     Weight 01/09/16 0949 139 lb (63 kg)     Height 01/09/16 0949 5\' 6"  (1.676 m)     Head Circumference --      Peak Flow --      Pain Score 01/09/16 0950 8     Pain  Loc --      Pain Edu? --      Excl. in [place]? --     Constitutional: Alert and oriented. Well appearing and in no acute distress. Eyes: Conjunctivae are normal. PERRL. EOMI. Head: Atraumatic. Nose: No congestion/rhinnorhea. Mouth/Throat: Mucous membranes are moist.  Oropharynx non-erythematous. Neck: No stridor.  Supple without meningismus. Cardiovascular: Normal rate, irregular rhythm. Grossly normal heart sounds.  Good peripheral circulation. Respiratory: Normal respiratory effort.  No retractions. Lungs CTAB. Gastrointestinal: Soft and nontender. No distention.  No CVA tenderness. Genitourinary: deferred Musculoskeletal: No lower extremity tenderness nor edema.  No joint effusions. Neurologic:  Normal speech and language. No gross focal neurologic deficits are appreciated. No gait instability. Skin:  Skin is warm, dry and intact. No rash noted. Psychiatric: Mood  and affect are normal. Speech and behavior are normal.  ____________________________________________   LABS (all labs ordered are listed, but only abnormal results are displayed)  Labs Reviewed  COMPREHENSIVE METABOLIC PANEL - Abnormal; Notable for the following:       Result Value   Glucose, Bld 148 (*)    AST 43 (*)    All other components within normal limits  URINALYSIS COMPLETEWITH MICROSCOPIC ([facility] ONLY) - Abnormal; Notable for the following:    Color, Urine YELLOW (*)    APPearance CLEAR (*)    Squamous Epithelial / LPF 0-5 (*)    All other components within normal limits  PROTIME-INR - Abnormal; Notable for the following:    Prothrombin Time 20.2 (*)    All other components within normal limits  CBC WITH DIFFERENTIAL/PLATELET   ____________________________________________  EKG  ED ECG REPORT I, Joanne Gavel, the attending physician, personally viewed and interpreted this ECG.   Date: 01/09/2016  EKG Time: 09:40  Rate: 58  Rhythm: atrial fibrillation, rate 58  Axis: left  Intervals:left bundle  branch block  ST&T Change: No acute ST elevation. EKG is similar to that obtained on 09/23/15.  ____________________________________________  RADIOLOGY  CXR IMPRESSION: Stable chest x-ray without evidence of acute cardiopulmonary process. Aortic Atherosclerosis (ICD10-170.0) ____________________________________________   PROCEDURES  Procedure(s) performed: None  Procedures  Critical Care performed: No  ____________________________________________   INITIAL IMPRESSION / ASSESSMENT AND PLAN / ED COURSE  Pertinent labs & imaging results that were available during my care of the patient were reviewed by me and considered in my medical decision making (see chart for details).  Ordell Ureste is a 82 y.o. female with history of afib on coumadin, CHF, DM, GERD, HTN, HLD who presents for evaluation of generalized weakness, low heart rate, intermittent near syncope over the past several days, gradual onset, intermittent, moderate, worse when she stands up and moves around. She also had an episode this morning where she was bradycardic at the cardiology office and had an episode of near-syncope. On exam, she is nontoxic appearing and in no acute distress. Her vital signs are notable for mild bradycardia, heart rate in the 50s, maintaining adequate blood pressure. However given that her symptoms are typically worse with exertion, cardiac monitor shows frequent sustained bigeminy, my concern is for symptomatic bradycardia. I reviewed her labs which show a slightly subtherapeutic INR at 1.7, the remainder of her labs are unremarkable. Chest x-ray shows no acute cardiopulmonary disease. I discussed the case with Dr. Clayborn Bigness. Case discussed with hospitalist for admission as well at 12:15 pm.  Clinical Course     ____________________________________________   FINAL CLINICAL IMPRESSION(S) / ED DIAGNOSES  Final diagnoses:  Symptomatic bradycardia      NEW MEDICATIONS STARTED DURING THIS  VISIT:  New Prescriptions   No medications on file     Note:  This document was prepared using Dragon voice recognition software and may include unintentional dictation errors.    Joanne Gavel, MD 01/09/16 1220

## 2016-01-09 NOTE — ED Notes (Signed)
Pt resting in bed, resp even and unlabored, family at bedside, pt updated on plan of care for admission

## 2016-01-09 NOTE — ED Triage Notes (Signed)
Patient presents to the ED with severe fatigue, weakness, dizziness, occasional headache, intermittent blurry vision and chest discomfort.  Patient has a history of AFib and takes coumadin.  Patient appears weak and shaky in triage.  Patient states weakness is bilateral, not worse on one side than the other.  Patient states chest discomfort is worse with taking deep breath.

## 2016-01-09 NOTE — Progress Notes (Signed)
ANTICOAGULATION CONSULT NOTE - Initial Consult  Pharmacy Consult for warfarin dosing Indication: atrial fibrillation  Allergies  Allergen Reactions  . Codeine Anaphylaxis  . Penicillins Anaphylaxis    Has patient had a PCN reaction causing immediate rash, facial/tongue/throat swelling, SOB or lightheadedness with hypotension: Yes Has patient had a PCN reaction causing severe rash involving mucus membranes or skin necrosis: No Has patient had a PCN reaction that required hospitalization Yes Has patient had a PCN reaction occurring within the last 10 years: No If all of the above answers are "NO", then may proceed with Cephalosporin use.  . Clonidine Derivatives Other (See Comments)    Reaction:  Unknown   . Gabapentin Nausea Only  . Losartan Other (See Comments)    Reaction:  Muscle aches   . Morphine And Related Other (See Comments)    Reaction:  Unknown   . Reglan [Metoclopramide] Other (See Comments)    Reaction:  Tremors   . Hydralazine Anxiety    Patient Measurements: Height: 5\' 6"  (167.6 cm) Weight: 139 lb (63 kg) IBW/kg (Calculated) : 59.3  Vital Signs: Temp: 97.4 F (36.3 C) (07/27 1100) Temp Source: Oral (07/27 1100) BP: 147/98 (07/27 1300) Pulse Rate: 52 (07/27 1300)  Labs:  Recent Labs  01/09/16 1047  HGB 14.8  HCT 43.8  PLT 208  LABPROT 20.2*  INR 1.70  CREATININE 0.61    Estimated Creatinine Clearance: 46.4 mL/min (by C-G formula based on SCr of 0.8 mg/dL).   Medical History: Past Medical History:  Diagnosis Date  . Atrial fibrillation ([place]) 2012  . CHF (congestive heart failure) ([place])   . Colon polyp 2003  . Coronary artery disease   . Diabetes mellitus without complication ([place])   . GERD (gastroesophageal reflux disease)   . Hyperlipidemia   . Hypertension   . Irritable bowel syndrome   . Polyp of colon   . Ulcer     Medications:  Scheduled:  . amLODipine  5 mg Oral Daily  . aspirin EC  81 mg Oral Daily  . ergocalciferol  50,000  Units Oral Weekly  . escitalopram  10 mg Oral Daily  . hydrochlorothiazide  25 mg Oral QODAY  . isosorbide mononitrate  60 mg Oral BID  . lisinopril  10 mg Oral Daily  . pantoprazole  40 mg Oral Daily  . sodium chloride flush  3 mL Intravenous Q12H  . Vitamin D3  1 capsule Oral Daily  . warfarin  4 mg Oral Daily    Assessment: Pharmacy has been consulted to dose warfarin in this 82 yo female. Prior to admission patient was taking warfarin 4mg  daily for afib. Today her INR was 1.7 (goal INR 2-3). Discussed need for bridge therapy with MD and he would like to continue with no bridge at this time.  Goal of Therapy:  INR 2-3 Monitor platelets by anticoagulation protocol: Yes   Plan:  Will increase her dose today to 5 mg and continue to monitor daily for dose adjustments  Date  INR  Dose 01/09/16 1.7  5mg  daily   Darrow Bussing, PharmD Pharmacy Resident 01/09/2016 3:11 PM

## 2016-01-09 NOTE — ED Notes (Signed)
EDP and family at bedside  ?

## 2016-01-09 NOTE — H&P (Signed)
[facility] NAME: [name]    MR#:  [id number]  DATE OF BIRTH:  09-28-1926  DATE OF ADMISSION:  01/09/2016  PRIMARY CARE PHYSICIAN: Crecencio Mc, MD   REQUESTING/REFERRING PHYSICIAN: Edd Fabian  CHIEF COMPLAINT:   Chief Complaint  Patient presents with  . Chest Pain  . Weakness  . Dizziness    HISTORY OF PRESENT ILLNESS: Leah Holmes  is a 82 y.o. female with a known history of Atrial fibrillation, CHF, coronary artery disease, diabetes, hyperlipidemia, hypertension, irritable bowel syndrome- for last 2 days was feeling increasingly weak and dizzy, went to Dr. Alveria Apley office for her routine appointment. She was noted to be having bradycardia in the office while having an episode of the symptoms. Concerned with this they suggested her to go to emergency room for admission in hospital. She was taking atenolol for atrial fibrillation at home, we will hold it and keep her on telemetry monitoring for now. She is completely alert and oriented and blood pressure is stable.  PAST MEDICAL HISTORY:   Past Medical History:  Diagnosis Date  . Atrial fibrillation ([place]) 2012  . CHF (congestive heart failure) ([place])   . Colon polyp 2003  . Coronary artery disease   . Diabetes mellitus without complication ([place])   . GERD (gastroesophageal reflux disease)   . Hyperlipidemia   . Hypertension   . Irritable bowel syndrome   . Polyp of colon   . Ulcer     PAST SURGICAL HISTORY: Past Surgical History:  Procedure Laterality Date  . ABDOMINAL ADHESION SURGERY    . ABDOMINAL HYSTERECTOMY    . APPENDECTOMY    . BREAST SURGERY    . CHOLECYSTECTOMY  03-2012  . COLONOSCOPY  2003   Dr Theodosia Paling in [place]  . fibroid tumor    . UPPER GI ENDOSCOPY  2003   Dr Theodosia Paling in [place]:  Social History  Substance Use Topics  . Smoking status: Never Smoker  . Smokeless tobacco: Current User    Types: Snuff     Comment:  occasionally  . Alcohol use No    FAMILY HISTORY:  Family History  Problem Relation Age of Onset  . Cancer Sister 54    breast    DRUG ALLERGIES:  Allergies  Allergen Reactions  . Codeine Anaphylaxis  . Penicillins Anaphylaxis    Has patient had a PCN reaction causing immediate rash, facial/tongue/throat swelling, SOB or lightheadedness with hypotension: Yes Has patient had a PCN reaction causing severe rash involving mucus membranes or skin necrosis: No Has patient had a PCN reaction that required hospitalization Yes Has patient had a PCN reaction occurring within the last 10 years: No If all of the above answers are "NO", then may proceed with Cephalosporin use.  . Clonidine Derivatives Other (See Comments)    Reaction:  Unknown   . Gabapentin Nausea Only  . Losartan Other (See Comments)    Reaction:  Muscle aches   . Morphine And Related Other (See Comments)    Reaction:  Unknown   . Reglan [Metoclopramide] Other (See Comments)    Reaction:  Tremors   . Hydralazine Anxiety    REVIEW OF SYSTEMS:   CONSTITUTIONAL: No fever, fatigue or weakness. She was feeling dizzy. EYES: No blurred or double vision.  EARS, NOSE, AND THROAT: No tinnitus or ear pain.  RESPIRATORY: No cough, shortness of breath, wheezing or hemoptysis.  CARDIOVASCULAR: No chest pain, orthopnea,  edema.  GASTROINTESTINAL: No nausea, vomiting, diarrhea or abdominal pain.  GENITOURINARY: No dysuria, hematuria.  ENDOCRINE: No polyuria, nocturia,  HEMATOLOGY: No anemia, easy bruising or bleeding SKIN: No rash or lesion. MUSCULOSKELETAL: No joint pain or arthritis.   NEUROLOGIC: No tingling, numbness, weakness.  PSYCHIATRY: No anxiety or depression.   MEDICATIONS AT HOME:  Prior to Admission medications   Medication Sig Start Date End Date Taking? Authorizing Provider  amLODipine (NORVASC) 5 MG tablet TAKE ONE TABLET BY MOUTH ONCE DAILY 07/16/15  Yes Crecencio Mc, MD  aspirin EC 81 MG tablet Take 81 mg  by mouth daily.   Yes Historical Provider, MD  atenolol (TENORMIN) 25 MG tablet TAKE ONE TABLET BY MOUTH TWICE DAILY 12/12/15  Yes Crecencio Mc, MD  Cholecalciferol (VITAMIN D3) 1000 UNITS CAPS Take 1 capsule by mouth daily.   Yes Historical Provider, MD  hydrochlorothiazide (HYDRODIURIL) 25 MG tablet Take 25 mg by mouth every other day.    Yes Historical Provider, MD  isosorbide mononitrate (IMDUR) 60 MG 24 hr tablet Take 60 mg by mouth 2 (two) times daily.    Yes Historical Provider, MD  lisinopril (PRINIVIL,ZESTRIL) 20 MG tablet TAKE ONE TABLET BY MOUTH TWICE DAILY DOSE  CHANGE 09/20/15  Yes Crecencio Mc, MD  meclizine (ANTIVERT) 25 MG tablet TAKE ONE TABLET BY MOUTH THREE TIMES DAILY AS NEEDED 12/12/15  Yes Crecencio Mc, MD  nitroGLYCERIN (NITROSTAT) 0.4 MG SL tablet Place 0.4 mg under the tongue every 5 (five) minutes as needed for chest pain.   Yes Historical Provider, MD  omeprazole (PRILOSEC) 20 MG capsule TAKE ONE CAPSULE BY MOUTH ONCE DAILY 06/19/15  Yes Crecencio Mc, MD  psyllium (METAMUCIL) 58.6 % packet Take 1 packet by mouth daily.   Yes Historical Provider, MD  warfarin (COUMADIN) 2 MG tablet Take 4 mg by mouth daily.    Yes Historical Provider, MD  ergocalciferol (DRISDOL) 50000 UNITS capsule Take 1 capsule (50,000 Units total) by mouth once a week. 03/17/15   Crecencio Mc, MD  escitalopram (LEXAPRO) 10 MG tablet Take 1 tablet (10 mg total) by mouth daily. 10/30/15   Crecencio Mc, MD  glucose blood (ONE TOUCH ULTRA TEST) test strip Check sugar bid Dx E11.9 02/22/15   Crecencio Mc, MD  [facility] DELICA LANCETS [id number] MISC USE ONE LANCET TO CHECK BLOOD SUGAR TWICE DAILY  Dx Code E11.9 02/21/15   Crecencio Mc, MD      PHYSICAL EXAMINATION:   VITAL SIGNS: Blood pressure (!) 147/98, pulse (!) 52, temperature 97.4 F (36.3 C), temperature source Oral, resp. rate 17, height 5\' 6"  (1.676 m), weight 63 kg (139 lb), SpO2 95 %.  GENERAL:  82 y.o.-year-old patient lying in the bed with no  acute distress.  EYES: Pupils equal, round, reactive to light and accommodation. No scleral icterus. Extraocular muscles intact.  HEENT: Head atraumatic, normocephalic. Oropharynx and nasopharynx clear.  NECK:  Supple, no jugular venous distention. No thyroid enlargement, no tenderness.  LUNGS: Normal breath sounds bilaterally, no wheezing, rales,rhonchi or crepitation. No use of accessory muscles of respiration.  CARDIOVASCULAR: S1, S2 Irregular. No murmurs, rubs, or gallops.  ABDOMEN: Soft, nontender, nondistended. Bowel sounds present. No organomegaly or mass.  EXTREMITIES: No pedal edema, cyanosis, or clubbing.  NEUROLOGIC: Cranial nerves II through XII are intact. Muscle strength 5/5 in all extremities. Sensation intact. Gait not checked.  PSYCHIATRIC: The patient is alert and oriented x 3.  SKIN: No obvious rash, lesion,  or ulcer.   LABORATORY PANEL:   CBC  Recent Labs Lab 01/09/16 1047  WBC 5.2  HGB 14.8  HCT 43.8  PLT 208  MCV 92.2  MCH 31.1  MCHC 33.7  RDW 13.4  LYMPHSABS 1.3  MONOABS 0.5  EOSABS 0.1  BASOSABS 0.0   ------------------------------------------------------------------------------------------------------------------  Chemistries   Recent Labs Lab 01/09/16 1047  NA 136  K 3.9  CL 101  CO2 28  GLUCOSE 148*  BUN 9  CREATININE 0.61  CALCIUM 9.5  AST 43*  ALT 22  ALKPHOS 62  BILITOT 1.1   ------------------------------------------------------------------------------------------------------------------ estimated creatinine clearance is 46.4 mL/min (by C-G formula based on SCr of 0.8 mg/dL). ------------------------------------------------------------------------------------------------------------------ No results for input(s): TSH, T4TOTAL, T3FREE, THYROIDAB in the last 72 hours.  Invalid input(s): FREET3   Coagulation profile  Recent Labs Lab 01/09/16 1047  INR 1.70    ------------------------------------------------------------------------------------------------------------------- No results for input(s): DDIMER in the last 72 hours. -------------------------------------------------------------------------------------------------------------------  Cardiac Enzymes No results for input(s): CKMB, TROPONINI, MYOGLOBIN in the last 168 hours.  Invalid input(s): CK ------------------------------------------------------------------------------------------------------------------ Invalid input(s): POCBNP  ---------------------------------------------------------------------------------------------------------------  Urinalysis    Component Value Date/Time   COLORURINE YELLOW (A) 01/09/2016 1120   APPEARANCEUR CLEAR (A) 01/09/2016 1120   APPEARANCEUR Clear 10/05/2014 2114   LABSPEC 1.006 01/09/2016 1120   LABSPEC 1.027 10/05/2014 2114   PHURINE 6.0 01/09/2016 1120   GLUCOSEU NEGATIVE 01/09/2016 1120   GLUCOSEU Negative 10/05/2014 2114   HGBUR NEGATIVE 01/09/2016 1120   BILIRUBINUR NEGATIVE 01/09/2016 1120   BILIRUBINUR Negative 10/05/2014 2114   KETONESUR NEGATIVE 01/09/2016 1120   PROTEINUR NEGATIVE 01/09/2016 1120   NITRITE NEGATIVE 01/09/2016 1120   LEUKOCYTESUR NEGATIVE 01/09/2016 1120   LEUKOCYTESUR 2+ 10/05/2014 2114     RADIOLOGY: Dg Chest 2 View  Result Date: 01/09/2016 CLINICAL DATA:  82 year old female with severe fatigue, weakness, dizziness, occasional headaches, intermittent blurry vision and chest discomfort. EXAM: CHEST  2 VIEW COMPARISON:  Prior chest x-ray 09/23/2015 FINDINGS: Stable cardiac and mediastinal contours. Atherosclerotic calcifications again noted in the transverse aorta. Stable mild interstitial prominence and central bronchitic changes. No focal airspace consolidation, pulmonary edema, pleural effusion or pneumothorax. No focal airspace consolidation. No acute osseous abnormality. IMPRESSION: Stable chest x-ray  without evidence of acute cardiopulmonary process. Aortic Atherosclerosis (ICD10-170.0) Electronically Signed   By: Jacqulynn Cadet M.D.   On: 01/09/2016 10:24   EKG: Orders placed or performed during the hospital encounter of 01/09/16  . EKG 12-Lead  . EKG 12-Lead  . ED EKG within 10 minutes  . ED EKG within 10 minutes    IMPRESSION AND PLAN:  * Symptomatic bradycardia   Currently I will hold atenolol, check TSH level.   Monitor on telemetry, cardiology consult for further management.  * Hypertension   Continue amlodipine, lisinopril, hydrochlorothiazide.  * Diabetes   She was recently taken off from her medication and her blood sugar was running normal after that.  * Atrial fibrillation   Hold atenolol, continue Coumadin.  All the records are reviewed and case discussed with ED provider. Management plans discussed with the patient, family and they are in agreement.  CODE STATUS: Full code Code Status History    This patient does not have a recorded code status. Please follow your organizational policy for patients in this situation.     TOTAL TIME TAKING CARE OF THIS PATIENT: 45 minutes.   Patient's daughter was present in the room during admission. Vaughan Basta M.D on 01/09/2016   Between 7am to 6pm -  Pager - [phone]  After 6pm go to [email or web address] - password [email or web address] [facility]  Office  [phone]  CC: Primary care physician; Crecencio Mc, MD   Note: This dictation was prepared with Dragon dictation along with smaller phrase technology. Any transcriptional errors that result from this process are unintentional.

## 2016-01-10 ENCOUNTER — Telehealth: Payer: Self-pay | Admitting: *Deleted

## 2016-01-10 DIAGNOSIS — R001 Bradycardia, unspecified: Secondary | ICD-10-CM | POA: Diagnosis not present

## 2016-01-10 LAB — BASIC METABOLIC PANEL
Anion gap: 7 (ref 5–15)
BUN: 8 mg/dL (ref 6–20)
CALCIUM: 9.2 mg/dL (ref 8.9–10.3)
CO2: 28 mmol/L (ref 22–32)
CREATININE: 0.65 mg/dL (ref 0.44–1.00)
Chloride: 102 mmol/L (ref 101–111)
GFR calc Af Amer: >60 mL/min (ref >60)
GLUCOSE: 124 mg/dL — AB (ref 65–99)
POTASSIUM: 3.6 mmol/L (ref 3.5–5.1)
SODIUM: 137 mmol/L (ref 135–145)

## 2016-01-10 LAB — CBC
HCT: 44.2 % (ref 35.0–47.0)
Hemoglobin: 14.9 g/dL (ref 12.0–16.0)
MCH: 31.2 pg (ref 26.0–34.0)
MCHC: 33.7 g/dL (ref 32.0–36.0)
MCV: 92.3 fL (ref 80.0–100.0)
PLATELETS: 205 10*3/uL (ref 150–440)
RBC: 4.78 MIL/uL (ref 3.80–5.20)
RDW: 13.1 % (ref 11.5–14.5)
WBC: 4.5 10*3/uL (ref 3.6–11.0)

## 2016-01-10 LAB — PROTIME-INR
INR: 1.85
PROTHROMBIN TIME: 21.6 s — AB (ref 11.4–15.2)

## 2016-01-10 MED ORDER — VITAMIN D 1000 UNITS PO TABS
1000.0000 [IU] | ORAL_TABLET | Freq: Every day | ORAL | Status: DC
  Administered 2016-01-10: 1000 [IU] via ORAL
  Filled 2016-01-10: qty 1

## 2016-01-10 MED ORDER — WARFARIN SODIUM 1 MG PO TABS: 4.0000 mg | ORAL_TABLET | Freq: Every day | ORAL | Status: DC

## 2016-01-10 NOTE — Consult Note (Signed)
Reason for Consult: Bradycardia weakness Referring Physician: Dr. Benjie Karvonen hospitalist Cardiologist's Leah Holmes is an 82 y.o. female.  HPI: Patient presented to cardiologist office with weakness fatigue was found to be severely bradycardic in the 40s she has history of atrial fibrillation congestive heart failure coronary disease diabetes hyperlipidemia hypertension who states she's not flow, normal sodium the last few weeks. Bradycardia was noted in the office patient is being treated with atenolol for rate control for A. fib patient was then recommended to go to the emergency room and she was subsequently admitted for further evaluation placed on telemetry. Cardiac workup on telemetry is unremarkable heart rate improved in the 70s with no rapid ventricular response A. fib episodes. The patient does not have a permanent pacemaker is on anticoagulation with Coumadin.  Past Medical History:  Diagnosis Date  . Atrial fibrillation ([place]) 2012  . CHF (congestive heart failure) ([place])   . Colon polyp 2003  . Coronary artery disease   . Diabetes mellitus without complication ([place])   . GERD (gastroesophageal reflux disease)   . Hyperlipidemia   . Hypertension   . Irritable bowel syndrome   . Polyp of colon   . Ulcer     Past Surgical History:  Procedure Laterality Date  . ABDOMINAL ADHESION SURGERY    . ABDOMINAL HYSTERECTOMY    . APPENDECTOMY    . BREAST SURGERY    . CHOLECYSTECTOMY  03-2012  . COLONOSCOPY  2003   Dr Theodosia Paling in [place]  . fibroid tumor    . UPPER GI ENDOSCOPY  2003   Dr Theodosia Paling in [place]    Family History  Problem Relation Age of Onset  . Cancer Sister 41    breast    Social History:  reports that she has never smoked. Her smokeless tobacco use includes Snuff. She reports that she does not drink alcohol or use drugs.  Allergies:  Allergies  Allergen Reactions  . Codeine Anaphylaxis  . Penicillins Anaphylaxis    Has patient had a PCN reaction  causing immediate rash, facial/tongue/throat swelling, SOB or lightheadedness with hypotension: Yes Has patient had a PCN reaction causing severe rash involving mucus membranes or skin necrosis: No Has patient had a PCN reaction that required hospitalization Yes Has patient had a PCN reaction occurring within the last 10 years: No If all of the above answers are "NO", then may proceed with Cephalosporin use.  . Clonidine Derivatives Other (See Comments)    Reaction:  Unknown   . Gabapentin Nausea Only  . Losartan Other (See Comments)    Reaction:  Muscle aches   . Morphine And Related Other (See Comments)    Reaction:  Unknown   . Reglan [Metoclopramide] Other (See Comments)    Reaction:  Tremors   . Hydralazine Anxiety    Medications: I have reviewed the patient's current medications.  Results for orders placed or performed during the hospital encounter of 01/09/16 (from the past 48 hour(s))  CBC with Differential     Status: None   Collection Time: 01/09/16 10:47 AM  Result Value Ref Range   WBC 5.2 3.6 - 11.0 K/uL   RBC 4.75 3.80 - 5.20 MIL/uL   Hemoglobin 14.8 12.0 - 16.0 g/dL   HCT 43.8 35.0 - 47.0 %   MCV 92.2 80.0 - 100.0 fL   MCH 31.1 26.0 - 34.0 pg   MCHC 33.7 32.0 - 36.0 g/dL   RDW 13.4 11.5 - 14.5 %   Platelets 208 150 -  440 K/uL   Neutrophils Relative % 63 %   Neutro Abs 3.3 1.4 - 6.5 K/uL   Lymphocytes Relative 25 %   Lymphs Abs 1.3 1.0 - 3.6 K/uL   Monocytes Relative 10 %   Monocytes Absolute 0.5 0.2 - 0.9 K/uL   Eosinophils Relative 1 %   Eosinophils Absolute 0.1 0 - 0.7 K/uL   Basophils Relative 1 %   Basophils Absolute 0.0 0 - 0.1 K/uL  Comprehensive metabolic panel     Status: Abnormal   Collection Time: 01/09/16 10:47 AM  Result Value Ref Range   Sodium 136 135 - 145 mmol/L   Potassium 3.9 3.5 - 5.1 mmol/L   Chloride 101 101 - 111 mmol/L   CO2 28 22 - 32 mmol/L   Glucose, Bld 148 (H) 65 - 99 mg/dL   BUN 9 6 - 20 mg/dL   Creatinine, Ser 0.61 0.44 -  1.00 mg/dL   Calcium 9.5 8.9 - 10.3 mg/dL   Total Protein 7.0 6.5 - 8.1 g/dL   Albumin 4.0 3.5 - 5.0 g/dL   AST 43 (H) 15 - 41 U/L   ALT 22 14 - 54 U/L   Alkaline Phosphatase 62 38 - 126 U/L   Total Bilirubin 1.1 0.3 - 1.2 mg/dL   GFR calc non Af Amer >60 >60 mL/min   GFR calc Af Amer >60 >60 mL/min    Comment: (NOTE) The eGFR has been calculated using the CKD EPI equation. This calculation has not been validated in all clinical situations. eGFR's persistently <60 mL/min signify possible Chronic Kidney Disease.    Anion gap 7 5 - 15  Protime-INR     Status: Abnormal   Collection Time: 01/09/16 10:47 AM  Result Value Ref Range   Prothrombin Time 20.2 (H) 11.4 - 15.2 seconds   INR 1.70   TSH     Status: None   Collection Time: 01/09/16 10:47 AM  Result Value Ref Range   TSH 0.745 0.350 - 4.500 uIU/mL  Urinalysis complete, with microscopic ([facility] only)     Status: Abnormal   Collection Time: 01/09/16 11:20 AM  Result Value Ref Range   Color, Urine YELLOW (A) YELLOW   APPearance CLEAR (A) CLEAR   Glucose, UA NEGATIVE NEGATIVE mg/dL   Bilirubin Urine NEGATIVE NEGATIVE   Ketones, ur NEGATIVE NEGATIVE mg/dL   Specific Gravity, Urine 1.006 1.005 - 1.030   Hgb urine dipstick NEGATIVE NEGATIVE   pH 6.0 5.0 - 8.0   Protein, ur NEGATIVE NEGATIVE mg/dL   Nitrite NEGATIVE NEGATIVE   Leukocytes, UA NEGATIVE NEGATIVE   RBC / HPF NONE SEEN 0 - 5 RBC/hpf   WBC, UA 0-5 0 - 5 WBC/hpf   Bacteria, UA NONE SEEN NONE SEEN   Squamous Epithelial / LPF 0-5 (A) NONE SEEN   Hyaline Casts, UA PRESENT   Basic metabolic panel     Status: Abnormal   Collection Time: 01/10/16  4:50 AM  Result Value Ref Range   Sodium 137 135 - 145 mmol/L   Potassium 3.6 3.5 - 5.1 mmol/L   Chloride 102 101 - 111 mmol/L   CO2 28 22 - 32 mmol/L   Glucose, Bld 124 (H) 65 - 99 mg/dL   BUN 8 6 - 20 mg/dL   Creatinine, Ser 0.65 0.44 - 1.00 mg/dL   Calcium 9.2 8.9 - 10.3 mg/dL   GFR calc non Af Amer >60 >60 mL/min    GFR calc Af Amer >60 >60 mL/min  Comment: (NOTE) The eGFR has been calculated using the CKD EPI equation. This calculation has not been validated in all clinical situations. eGFR's persistently <60 mL/min signify possible Chronic Kidney Disease.    Anion gap 7 5 - 15  CBC     Status: None   Collection Time: 01/10/16  4:50 AM  Result Value Ref Range   WBC 4.5 3.6 - 11.0 K/uL   RBC 4.78 3.80 - 5.20 MIL/uL   Hemoglobin 14.9 12.0 - 16.0 g/dL   HCT 44.2 35.0 - 47.0 %   MCV 92.3 80.0 - 100.0 fL   MCH 31.2 26.0 - 34.0 pg   MCHC 33.7 32.0 - 36.0 g/dL   RDW 13.1 11.5 - 14.5 %   Platelets 205 150 - 440 K/uL  Protime-INR     Status: Abnormal   Collection Time: 01/10/16  4:50 AM  Result Value Ref Range   Prothrombin Time 21.6 (H) 11.4 - 15.2 seconds   INR 1.85     Dg Chest 2 View  Result Date: 01/09/2016 CLINICAL DATA:  82 year old female with severe fatigue, weakness, dizziness, occasional headaches, intermittent blurry vision and chest discomfort. EXAM: CHEST  2 VIEW COMPARISON:  Prior chest x-ray 09/23/2015 FINDINGS: Stable cardiac and mediastinal contours. Atherosclerotic calcifications again noted in the transverse aorta. Stable mild interstitial prominence and central bronchitic changes. No focal airspace consolidation, pulmonary edema, pleural effusion or pneumothorax. No focal airspace consolidation. No acute osseous abnormality. IMPRESSION: Stable chest x-ray without evidence of acute cardiopulmonary process. Aortic Atherosclerosis (ICD10-170.0) Electronically Signed   By: Jacqulynn Cadet M.D.   On: 01/09/2016 10:24   Review of Systems  Constitutional: Positive for diaphoresis and malaise/fatigue.  Eyes: Negative.   Respiratory: Negative.   Cardiovascular: Positive for palpitations.  Gastrointestinal: Negative.   Genitourinary: Negative.   Musculoskeletal: Negative.   Skin: Negative.   Neurological: Positive for dizziness, tremors, weakness and headaches.   Endo/Heme/Allergies: Negative.   Psychiatric/Behavioral: The patient is nervous/anxious.    Blood pressure (!) 110/48, pulse (!) 55, temperature 97.7 F (36.5 C), temperature source Oral, resp. rate 16, height _0  (1.676 m), weight 63.9 kg (140 lb 14.4 oz), SpO2 95 %. Physical Exam  Nursing note and vitals reviewed. Constitutional: She is oriented to person, place, and time. She appears well-developed and well-nourished.  HENT:  Head: Normocephalic and atraumatic.  Eyes: Conjunctivae and EOM are normal. Pupils are equal, round, and reactive to light.  Neck: Normal range of motion. Neck supple.  Cardiovascular: Normal rate and regular rhythm.   Murmur heard. Bradycardia  Respiratory: Effort normal and breath sounds normal.  GI: Soft. Bowel sounds are normal.  Musculoskeletal: Normal range of motion.  Neurological: She is alert and oriented to person, place, and time.  Skin: Skin is warm and dry.  Psychiatric: She has a normal mood and affect.    Assessment/Plan: Bradycardia Atrial fibrillation Congestive heart failure Coronary artery disease Diabetes type 2 without complications GERD Hypertension Hyperlipidemia Weakness Atrial fibrillation . PLAN Discontinue atenolol Agree with telemetry for evaluation Consider permanent pacemaker for patient probably does not meet criteria Rule out for myocardial infarction Generalized weakness rule out infection UTI GERD continue current medications Diabetes type 2 uncomplicated continue diabetic therapy Recommend Antivert for vertigo symptoms Coumadin for anticoagulation Recommend continue Prilosec for GERD symptoms If heart rate improves also atenolol we'll treat blood pressure with a non-AV nodal blocking drug follow-up with primary  CALLWOOD,DWAYNE D. 01/10/2016, 12:31 PM

## 2016-01-10 NOTE — Progress Notes (Signed)
ANTICOAGULATION CONSULT NOTE - Initial Consult  Pharmacy Consult for warfarin dosing Indication: atrial fibrillation  Allergies  Allergen Reactions  . Codeine Anaphylaxis  . Penicillins Anaphylaxis    Has patient had a PCN reaction causing immediate rash, facial/tongue/throat swelling, SOB or lightheadedness with hypotension: Yes Has patient had a PCN reaction causing severe rash involving mucus membranes or skin necrosis: No Has patient had a PCN reaction that required hospitalization Yes Has patient had a PCN reaction occurring within the last 10 years: No If all of the above answers are "NO", then may proceed with Cephalosporin use.  . Clonidine Derivatives Other (See Comments)    Reaction:  Unknown   . Gabapentin Nausea Only  . Losartan Other (See Comments)    Reaction:  Muscle aches   . Morphine And Related Other (See Comments)    Reaction:  Unknown   . Reglan [Metoclopramide] Other (See Comments)    Reaction:  Tremors   . Hydralazine Anxiety    Patient Measurements: Height: 5\' 6"  (167.6 cm) Weight: 140 lb 14.4 oz (63.9 kg) IBW/kg (Calculated) : 59.3  Vital Signs: Temp: 98 F (36.7 C) (07/28 0428) Temp Source: Oral (07/28 0428) BP: 100/46 (07/28 0428) Pulse Rate: 55 (07/28 0428)  Labs:  Recent Labs  01/09/16 1047 01/10/16 0450  HGB 14.8 14.9  HCT 43.8 44.2  PLT 208 205  LABPROT 20.2* 21.6*  INR 1.70 1.85  CREATININE 0.61 0.65    Estimated Creatinine Clearance: 46.4 mL/min (by C-G formula based on SCr of 0.8 mg/dL).   Medical History: Past Medical History:  Diagnosis Date  . Atrial fibrillation ([place]) 2012  . CHF (congestive heart failure) ([place])   . Colon polyp 2003  . Coronary artery disease   . Diabetes mellitus without complication ([place])   . GERD (gastroesophageal reflux disease)   . Hyperlipidemia   . Hypertension   . Irritable bowel syndrome   . Polyp of colon   . Ulcer     Medications:  Scheduled:  . amLODipine  5 mg Oral Daily  .  aspirin EC  81 mg Oral Daily  . cholecalciferol  1,000 Units Oral Daily  . ergocalciferol  50,000 Units Oral Weekly  . escitalopram  10 mg Oral Daily  . [START ON 01/11/2016] hydrochlorothiazide  25 mg Oral QODAY  . isosorbide mononitrate  60 mg Oral BID  . lisinopril  10 mg Oral Daily  . pantoprazole  40 mg Oral Daily  . sodium chloride flush  3 mL Intravenous Q12H  . Warfarin - Pharmacist Dosing Inpatient   Does not apply q1800    Assessment: Pharmacy has been consulted to dose warfarin in this 82 yo female. Prior to admission patient was taking warfarin 4mg  daily for afib.   Her last outpatient INR check on 7/25 INR was 3.3, held dose of warfarin on Wednesday 7/26 per MD.   Goal of Therapy:  INR 2-3 Monitor platelets by anticoagulation protocol: Yes   Date  INR  Dose 01/09/16 1.7  5mg  daily 01/10/16 1.8  4mg       Plan: Will resume patient's home regimen of warfarin 4mg  daily. Recheck INR with AM labs   Rexene Edison, PharmD Clinical Pharmacist  01/10/2016 9:18 AM

## 2016-01-10 NOTE — Discharge Summary (Signed)
[facility] [place] NAME: Leah Holmes    MR#:  [id number]  DATE OF BIRTH:  05-11-1927  DATE OF ADMISSION:  01/09/2016 ADMITTING PHYSICIAN: Vaughan Basta, MD  DATE OF DISCHARGE: 01/10/2016  PRIMARY CARE PHYSICIAN: Crecencio Mc, MD    ADMISSION DIAGNOSIS:  Symptomatic bradycardia [R00.1]  DISCHARGE DIAGNOSIS:  Principal Problem:   Symptomatic bradycardia   SECONDARY DIAGNOSIS:   Past Medical History:  Diagnosis Date  . Atrial fibrillation ([place]) 2012  . CHF (congestive heart failure) ([place])   . Colon polyp 2003  . Coronary artery disease   . Diabetes mellitus without complication ([place])   . GERD (gastroesophageal reflux disease)   . Hyperlipidemia   . Hypertension   . Irritable bowel syndrome   . Polyp of colon   . Ulcer     HOSPITAL COURSE:   82 year old female with a history of atrial fibrillation and essential hypertension who presented to her cardiologist's office for routine visit and was noted to have bradycardia with weakness. She was admitted to the hospital service further evaluation.  1. Symptomatic bradycardia: After discontinuing atenolol patient's heart rate is in the 70s to 90s. TSH was within normal limits. I am recommending for now she discontinue atenolol.  2. Essential hypertension: Patient is on several medications at home. Her blood pressure here was low normal. I am recommending that she discontinue atenolol and HCTZ along with isosorbide for now. She will continue on Norvasc and lisinopril. She will need close follow-up for blood pressure. She will need to check her blood pressure at home as well.  3. Diabetes: Patient is diet-controlled.  4. Chronic atrial fibrillation, currently in normal sinus: Atenolol is discontinued due to problem #1. Heart rate is controlled. She will continue Coumadin. She will follow-up with her cardiologist in the next 3-5 days.   DISCHARGE CONDITIONS AND DIET:    stable for discharge Diabetic heart healthy diet  CONSULTS OBTAINED:    DRUG ALLERGIES:   Allergies  Allergen Reactions  . Codeine Anaphylaxis  . Penicillins Anaphylaxis    Has patient had a PCN reaction causing immediate rash, facial/tongue/throat swelling, SOB or lightheadedness with hypotension: Yes Has patient had a PCN reaction causing severe rash involving mucus membranes or skin necrosis: No Has patient had a PCN reaction that required hospitalization Yes Has patient had a PCN reaction occurring within the last 10 years: No If all of the above answers are "NO", then may proceed with Cephalosporin use.  . Clonidine Derivatives Other (See Comments)    Reaction:  Unknown   . Gabapentin Nausea Only  . Losartan Other (See Comments)    Reaction:  Muscle aches   . Morphine And Related Other (See Comments)    Reaction:  Unknown   . Reglan [Metoclopramide] Other (See Comments)    Reaction:  Tremors   . Hydralazine Anxiety    DISCHARGE MEDICATIONS:   Current Discharge Medication List    CONTINUE these medications which have NOT CHANGED   Details  amLODipine (NORVASC) 5 MG tablet TAKE ONE TABLET BY MOUTH ONCE DAILY Qty: 90 tablet, Refills: 3    aspirin EC 81 MG tablet Take 81 mg by mouth daily.    lisinopril (PRINIVIL,ZESTRIL) 20 MG tablet TAKE ONE TABLET BY MOUTH TWICE DAILY DOSE  CHANGE Qty: 60 tablet, Refills: 3    meclizine (ANTIVERT) 25 MG tablet TAKE ONE TABLET BY MOUTH THREE TIMES DAILY AS NEEDED Qty: 30 tablet, Refills: 5  nitroGLYCERIN (NITROSTAT) 0.4 MG SL tablet Place 0.4 mg under the tongue every 5 (five) minutes as needed for chest pain.    omeprazole (PRILOSEC) 20 MG capsule TAKE ONE CAPSULE BY MOUTH ONCE DAILY Qty: 30 capsule, Refills: 5    psyllium (METAMUCIL) 58.6 % packet Take 1 packet by mouth daily.    warfarin (COUMADIN) 2 MG tablet Take 4 mg by mouth daily.     Cholecalciferol (VITAMIN D3) 1000 UNITS CAPS Take 1 capsule by mouth daily.     ergocalciferol (DRISDOL) 50000 UNITS capsule Take 1 capsule (50,000 Units total) by mouth once a week. Qty: 12 capsule, Refills: 0    escitalopram (LEXAPRO) 10 MG tablet Take 1 tablet (10 mg total) by mouth daily. Qty: 30 tablet, Refills: 2    glucose blood (ONE TOUCH ULTRA TEST) test strip Check sugar bid Dx E11.9 Qty: 100 each, Refills: 5    ONETOUCH DELICA LANCETS 99991111 MISC USE ONE LANCET TO CHECK BLOOD SUGAR TWICE DAILY  Dx Code E11.9 Qty: 100 each, Refills: 0      STOP taking these medications     atenolol (TENORMIN) 25 MG tablet      hydrochlorothiazide (HYDRODIURIL) 25 MG tablet      isosorbide mononitrate (IMDUR) 60 MG 24 hr tablet               Today   CHIEF COMPLAINT:  Patient doing well this morning. She denies weakness or dizziness   VITAL SIGNS:  Blood pressure (!) 110/48, pulse (!) 55, temperature 97.7 F (36.5 C), temperature source Oral, resp. rate 16, height 5\' 6"  (1.676 m), weight 63.9 kg (140 lb 14.4 oz), SpO2 95 %.   REVIEW OF SYSTEMS:  Review of Systems  Constitutional: Negative.  Negative for chills, fever and malaise/fatigue.  HENT: Negative.  Negative for ear discharge, ear pain, hearing loss, nosebleeds and sore throat.   Eyes: Negative.  Negative for blurred vision and pain.  Respiratory: Negative.  Negative for cough, hemoptysis, shortness of breath and wheezing.   Cardiovascular: Negative.  Negative for chest pain, palpitations and leg swelling.  Gastrointestinal: Negative.  Negative for abdominal pain, blood in stool, diarrhea, nausea and vomiting.  Genitourinary: Negative.  Negative for dysuria.  Musculoskeletal: Negative.  Negative for back pain.  Skin: Negative.   Neurological: Negative for dizziness, tremors, speech change, focal weakness, seizures and headaches.  Endo/Heme/Allergies: Negative.  Does not bruise/bleed easily.  Psychiatric/Behavioral: Negative.  Negative for depression, hallucinations and suicidal ideas.      PHYSICAL EXAMINATION:  GENERAL:  82 y.o.-year-old patient lying in the bed with no acute distress.  NECK:  Supple, no jugular venous distention. No thyroid enlargement, no tenderness.  LUNGS: Normal breath sounds bilaterally, no wheezing, rales,rhonchi  No use of accessory muscles of respiration.  CARDIOVASCULAR: S1, S2 normal. 2/6 murmurs,NOrubs, or gallops.  ABDOMEN: Soft, non-tender, non-distended. Bowel sounds present. No organomegaly or mass.  EXTREMITIES: No pedal edema, cyanosis, or clubbing.  PSYCHIATRIC: The patient is alert and oriented x 3.  SKIN: No obvious rash, lesion, or ulcer.   DATA REVIEW:   CBC  Recent Labs Lab 01/10/16 0450  WBC 4.5  HGB 14.9  HCT 44.2  PLT 205    Chemistries   Recent Labs Lab 01/09/16 1047 01/10/16 0450  NA 136 137  K 3.9 3.6  CL 101 102  CO2 28 28  GLUCOSE 148* 124*  BUN 9 8  CREATININE 0.61 0.65  CALCIUM 9.5 9.2  AST 43*  --  ALT 22  --   ALKPHOS 62  --   BILITOT 1.1  --     Cardiac Enzymes No results for input(s): TROPONINI in the last 168 hours.  Microbiology Results  @MICRORSLT48 @  RADIOLOGY:  Dg Chest 2 View  Result Date: 01/09/2016 CLINICAL DATA:  82 year old female with severe fatigue, weakness, dizziness, occasional headaches, intermittent blurry vision and chest discomfort. EXAM: CHEST  2 VIEW COMPARISON:  Prior chest x-ray 09/23/2015 FINDINGS: Stable cardiac and mediastinal contours. Atherosclerotic calcifications again noted in the transverse aorta. Stable mild interstitial prominence and central bronchitic changes. No focal airspace consolidation, pulmonary edema, pleural effusion or pneumothorax. No focal airspace consolidation. No acute osseous abnormality. IMPRESSION: Stable chest x-ray without evidence of acute cardiopulmonary process. Aortic Atherosclerosis (ICD10-170.0) Electronically Signed   By: Jacqulynn Cadet M.D.   On: 01/09/2016 10:24     Management plans discussed with the patient and  she is in agreement. Stable for discharge home  Patient should follow up with pcp and Dr Cammie Sickle 1 week  CODE STATUS:     Code Status Orders        Start     Ordered   01/09/16 1457  Full code  Continuous     01/09/16 1456    Code Status History    Date Active Date Inactive Code Status Order ID Comments User Context   This patient has a current code status but no historical code status.      TOTAL TIME TAKING CARE OF THIS PATIENT: 35 minutes.    Note: This dictation was prepared with Dragon dictation along with smaller phrase technology. Any transcriptional errors that result from this process are unintentional.  Lashawn Orrego M.D on 01/10/2016 at 10:37 AM  Between 7am to 6pm - Pager - [phone] After 6pm go to [email or web address] - password [email or web address] [facility]  Office  [phone]  CC: Primary care physician; Crecencio Mc, MD

## 2016-01-10 NOTE — Progress Notes (Signed)
Discharge instructions along with home medications and follow up gone over with patient and daughter. Both verbalize that they understood instructions. No prescriptions given to patient. IV and tele removed. Pt being discharged home on room air, no distress noted. Janee Ureste S Fenton, RN 

## 2016-01-10 NOTE — Telephone Encounter (Signed)
Erica from [facility] called to schedule a wk Hosp f/u for the patient . She was hospitalize for bradycardia . Your schedule is full. Can I use August 3rd at 11:30 am? Thanks

## 2016-01-10 NOTE — Telephone Encounter (Signed)
Yes you can

## 2016-01-13 DIAGNOSIS — I10 Essential (primary) hypertension: Secondary | ICD-10-CM | POA: Diagnosis not present

## 2016-01-13 DIAGNOSIS — R42 Dizziness and giddiness: Secondary | ICD-10-CM | POA: Diagnosis not present

## 2016-01-13 DIAGNOSIS — R0602 Shortness of breath: Secondary | ICD-10-CM | POA: Diagnosis not present

## 2016-01-13 DIAGNOSIS — R001 Bradycardia, unspecified: Secondary | ICD-10-CM | POA: Diagnosis not present

## 2016-01-13 DIAGNOSIS — I25118 Atherosclerotic heart disease of native coronary artery with other forms of angina pectoris: Secondary | ICD-10-CM | POA: Diagnosis not present

## 2016-01-13 DIAGNOSIS — R000 Tachycardia, unspecified: Secondary | ICD-10-CM | POA: Diagnosis not present

## 2016-01-13 DIAGNOSIS — I482 Chronic atrial fibrillation: Secondary | ICD-10-CM | POA: Diagnosis not present

## 2016-01-15 DIAGNOSIS — I482 Chronic atrial fibrillation: Secondary | ICD-10-CM | POA: Diagnosis not present

## 2016-01-16 ENCOUNTER — Ambulatory Visit: Payer: PRIVATE HEALTH INSURANCE | Admitting: Internal Medicine

## 2016-01-16 ENCOUNTER — Other Ambulatory Visit: Payer: Self-pay | Admitting: Internal Medicine

## 2016-01-17 DIAGNOSIS — I482 Chronic atrial fibrillation: Secondary | ICD-10-CM | POA: Diagnosis not present

## 2016-01-21 ENCOUNTER — Ambulatory Visit
Admission: RE | Admit: 2016-01-21 | Discharge: 2016-01-21 | Disposition: A | Discharge status: Home / Self Care | Payer: Medicare Other | Source: Ambulatory Visit | Attending: Cardiology | Admitting: Cardiology

## 2016-01-21 ENCOUNTER — Encounter
Admission: RE | Admit: 2016-01-21 | Discharge: 2016-01-21 | Disposition: A | Discharge status: Home / Self Care | Payer: Medicare Other | Source: Ambulatory Visit | Attending: Cardiology | Admitting: Cardiology

## 2016-01-21 DIAGNOSIS — I4891 Unspecified atrial fibrillation: Secondary | ICD-10-CM

## 2016-01-21 DIAGNOSIS — Z01812 Encounter for preprocedural laboratory examination: Secondary | ICD-10-CM | POA: Diagnosis not present

## 2016-01-21 DIAGNOSIS — I10 Essential (primary) hypertension: Secondary | ICD-10-CM

## 2016-01-21 DIAGNOSIS — I482 Chronic atrial fibrillation: Secondary | ICD-10-CM | POA: Insufficient documentation

## 2016-01-21 DIAGNOSIS — Z01818 Encounter for other preprocedural examination: Secondary | ICD-10-CM

## 2016-01-21 DIAGNOSIS — E119 Type 2 diabetes mellitus without complications: Secondary | ICD-10-CM

## 2016-01-21 DIAGNOSIS — Z01810 Encounter for preprocedural cardiovascular examination: Secondary | ICD-10-CM | POA: Diagnosis not present

## 2016-01-21 DIAGNOSIS — J439 Emphysema, unspecified: Secondary | ICD-10-CM | POA: Diagnosis not present

## 2016-01-21 HISTORY — DX: Other specified postprocedural states: Z98.890

## 2016-01-21 HISTORY — DX: Nausea with vomiting, unspecified: R11.2

## 2016-01-21 LAB — CBC
HCT: 43.9 % (ref 35.0–47.0)
HEMOGLOBIN: 14.9 g/dL (ref 12.0–16.0)
MCH: 31.3 pg (ref 26.0–34.0)
MCHC: 33.9 g/dL (ref 32.0–36.0)
MCV: 92.3 fL (ref 80.0–100.0)
Platelets: 212 10*3/uL (ref 150–440)
RBC: 4.76 MIL/uL (ref 3.80–5.20)
RDW: 13.3 % (ref 11.5–14.5)
WBC: 6.8 10*3/uL (ref 3.6–11.0)

## 2016-01-21 LAB — SURGICAL PCR SCREEN
MRSA, PCR: NEGATIVE
STAPHYLOCOCCUS AUREUS: NEGATIVE

## 2016-01-21 LAB — BASIC METABOLIC PANEL
ANION GAP: 6 (ref 5–15)
BUN: 10 mg/dL (ref 6–20)
CHLORIDE: 97 mmol/L — AB (ref 101–111)
CO2: 31 mmol/L (ref 22–32)
Calcium: 9.9 mg/dL (ref 8.9–10.3)
Creatinine, Ser: 0.63 mg/dL (ref 0.44–1.00)
GFR calc non Af Amer: >60 mL/min (ref >60)
Glucose, Bld: 202 mg/dL — ABNORMAL HIGH (ref 65–99)
POTASSIUM: 4.3 mmol/L (ref 3.5–5.1)
SODIUM: 134 mmol/L — AB (ref 135–145)

## 2016-01-21 NOTE — Patient Instructions (Signed)
  Your procedure is scheduled [id number] January 28, 2016. Report to Same Day Surgery. To find out your arrival time please call [phone] between 1PM - 3PM on Monday January 27, 2016.  Remember: Instructions that are not followed completely may result in serious medical risk, up to and including death, or upon the discretion of your surgeon and anesthesiologist your surgery may need to be rescheduled.    _x___ 1. Do not eat food or drink liquids after midnight. No gum chewing or hard candies.     ____ 2. No Alcohol for 24 hours before or after surgery.   ____ 3. Bring all medications with you on the day of surgery if instructed.    __x__ 4. Notify your doctor if there is any change in your medical condition     (cold, fever, infections).     Do not wear jewelry, make-up, hairpins, clips or nail polish.  Do not wear lotions, powders, or perfumes. You may wear deodorant.  Do not shave 48 hours prior to surgery. Men may shave face and neck.  Do not bring valuables to the hospital.    [facility] is not responsible for any belongings or valuables.               Contacts, dentures or bridgework may not be worn into surgery.  Leave your suitcase in the car. After surgery it may be brought to your room.  For patients admitted to the hospital, discharge time is determined by your treatment team.   Patients discharged the day of surgery will not be allowed to drive home.    Please read over the following fact sheets that you were given:   [facility] Preparing for Surgery  _x___ Take these medicines the morning of surgery with A SIP OF WATER: As instructed by cardiology.     ____ Fleet Enema (as directed)   _x___ Use CHG Soap as directed on instruction sheet  ____ Use inhalers on the day of surgery and bring to hospital day of surgery  ____ Stop metformin 2 days prior to surgery    ____ Take 1/2 of usual insulin dose the night before surgery and none on the morning of surgery.    _x___ Stop Coumadin and aspirin as instructed by cardiologist.  _x___ Stop Anti-inflammatories such as Advil, Aleve, Ibuprofen, Motrin, Naproxen, Naprosyn, Goodies powders or aspirin products. OK to take Tylenol.   _x___ Stop supplements:Cholecalciferol (VITAMIN D3) until after surgery.    ____ Bring C-Pap to the hospital.

## 2016-01-24 ENCOUNTER — Encounter: Payer: Self-pay | Admitting: Internal Medicine

## 2016-01-28 ENCOUNTER — Observation Stay
Admission: RE | Admit: 2016-01-28 | Discharge: 2016-01-29 | Disposition: A | Discharge status: Home / Self Care | Payer: Medicare Other | Source: Ambulatory Visit | Attending: Cardiology | Admitting: Cardiology

## 2016-01-28 ENCOUNTER — Encounter: Payer: Self-pay | Admitting: *Deleted

## 2016-01-28 ENCOUNTER — Inpatient Hospital Stay: Payer: Medicare Other | Admitting: Anesthesiology

## 2016-01-28 ENCOUNTER — Observation Stay: Payer: Medicare Other

## 2016-01-28 ENCOUNTER — Encounter
Admission: RE | Disposition: A | Discharge status: Home / Self Care | Payer: Self-pay | Source: Ambulatory Visit | Attending: Cardiology

## 2016-01-28 ENCOUNTER — Inpatient Hospital Stay: Payer: Medicare Other

## 2016-01-28 DIAGNOSIS — Z950 Presence of cardiac pacemaker: Secondary | ICD-10-CM | POA: Diagnosis not present

## 2016-01-28 DIAGNOSIS — K219 Gastro-esophageal reflux disease without esophagitis: Secondary | ICD-10-CM | POA: Diagnosis not present

## 2016-01-28 DIAGNOSIS — I4891 Unspecified atrial fibrillation: Secondary | ICD-10-CM | POA: Diagnosis not present

## 2016-01-28 DIAGNOSIS — I2510 Atherosclerotic heart disease of native coronary artery without angina pectoris: Secondary | ICD-10-CM | POA: Insufficient documentation

## 2016-01-28 DIAGNOSIS — Z7982 Long term (current) use of aspirin: Secondary | ICD-10-CM | POA: Insufficient documentation

## 2016-01-28 DIAGNOSIS — I25118 Atherosclerotic heart disease of native coronary artery with other forms of angina pectoris: Secondary | ICD-10-CM | POA: Diagnosis not present

## 2016-01-28 DIAGNOSIS — I495 Sick sinus syndrome: Secondary | ICD-10-CM | POA: Diagnosis present

## 2016-01-28 DIAGNOSIS — I272 Other secondary pulmonary hypertension: Secondary | ICD-10-CM | POA: Diagnosis not present

## 2016-01-28 DIAGNOSIS — I071 Rheumatic tricuspid insufficiency: Secondary | ICD-10-CM | POA: Insufficient documentation

## 2016-01-28 DIAGNOSIS — I491 Atrial premature depolarization: Secondary | ICD-10-CM | POA: Insufficient documentation

## 2016-01-28 DIAGNOSIS — Z7901 Long term (current) use of anticoagulants: Secondary | ICD-10-CM | POA: Insufficient documentation

## 2016-01-28 DIAGNOSIS — R001 Bradycardia, unspecified: Secondary | ICD-10-CM | POA: Diagnosis not present

## 2016-01-28 DIAGNOSIS — I509 Heart failure, unspecified: Secondary | ICD-10-CM | POA: Diagnosis not present

## 2016-01-28 DIAGNOSIS — Z79899 Other long term (current) drug therapy: Secondary | ICD-10-CM | POA: Diagnosis not present

## 2016-01-28 DIAGNOSIS — I447 Left bundle-branch block, unspecified: Secondary | ICD-10-CM | POA: Diagnosis not present

## 2016-01-28 DIAGNOSIS — E785 Hyperlipidemia, unspecified: Secondary | ICD-10-CM | POA: Diagnosis not present

## 2016-01-28 DIAGNOSIS — I482 Chronic atrial fibrillation: Principal | ICD-10-CM | POA: Insufficient documentation

## 2016-01-28 HISTORY — PX: PACEMAKER INSERTION: SHX728

## 2016-01-28 LAB — GLUCOSE, CAPILLARY
GLUCOSE-CAPILLARY: 141 mg/dL — AB (ref 65–99)
Glucose-Capillary: 180 mg/dL — ABNORMAL HIGH (ref 65–99)

## 2016-01-28 SURGERY — INSERTION, CARDIAC PACEMAKER
Anesthesia: General

## 2016-01-28 MED ORDER — VANCOMYCIN HCL IN DEXTROSE 1-5 GM/200ML-% IV SOLN
1000.0000 mg | Freq: Two times a day (BID) | INTRAVENOUS | Status: AC
  Administered 2016-01-28: 1000 mg via INTRAVENOUS
  Filled 2016-01-28: qty 200

## 2016-01-28 MED ORDER — DOCUSATE SODIUM 50 MG PO CAPS
50.0000 mg | ORAL_CAPSULE | Freq: Every day | ORAL | Status: DC
  Filled 2016-01-28: qty 1

## 2016-01-28 MED ORDER — MIDAZOLAM HCL 2 MG/2ML IJ SOLN
INTRAMUSCULAR | Status: DC | PRN
Start: 2016-01-28 — End: 2016-01-28
  Administered 2016-01-28: 1 mg via INTRAVENOUS

## 2016-01-28 MED ORDER — ONDANSETRON HCL 4 MG/2ML IJ SOLN: 4.0000 mg | Freq: Four times a day (QID) | INTRAMUSCULAR | Status: DC | PRN

## 2016-01-28 MED ORDER — SODIUM CHLORIDE 0.9 % IV SOLN
INTRAVENOUS | Status: DC
  Administered 2016-01-28 (×3): via INTRAVENOUS

## 2016-01-28 MED ORDER — SODIUM CHLORIDE 0.9 % IR SOLN
Freq: Once | Status: DC
  Filled 2016-01-28: qty 2

## 2016-01-28 MED ORDER — VANCOMYCIN HCL IN DEXTROSE 1-5 GM/200ML-% IV SOLN
1000.0000 mg | Freq: Once | INTRAVENOUS | Status: AC
  Administered 2016-01-28: 1000 mg via INTRAVENOUS

## 2016-01-28 MED ORDER — HYDROCHLOROTHIAZIDE 25 MG PO TABS
25.0000 mg | ORAL_TABLET | Freq: Every day | ORAL | Status: DC
  Administered 2016-01-29: 25 mg via ORAL
  Filled 2016-01-28: qty 1

## 2016-01-28 MED ORDER — FENTANYL CITRATE (PF) 100 MCG/2ML IJ SOLN: 25.0000 ug | INTRAMUSCULAR | Status: DC | PRN

## 2016-01-28 MED ORDER — VANCOMYCIN HCL IN DEXTROSE 1-5 GM/200ML-% IV SOLN
INTRAVENOUS | Status: AC
  Administered 2016-01-28: 1000 mg via INTRAVENOUS
  Filled 2016-01-28: qty 200

## 2016-01-28 MED ORDER — FENTANYL CITRATE (PF) 100 MCG/2ML IJ SOLN
INTRAMUSCULAR | Status: DC | PRN
  Administered 2016-01-28: 50 ug via INTRAVENOUS

## 2016-01-28 MED ORDER — GENTAMICIN SULFATE 40 MG/ML IJ SOLN
INTRAMUSCULAR | Status: AC
  Filled 2016-01-28: qty 2

## 2016-01-28 MED ORDER — PSYLLIUM 95 % PO PACK
1.0000 | PACK | Freq: Every day | ORAL | Status: DC
  Filled 2016-01-28: qty 1

## 2016-01-28 MED ORDER — ISOSORBIDE MONONITRATE ER 60 MG PO TB24
60.0000 mg | ORAL_TABLET | Freq: Every day | ORAL | Status: DC
  Administered 2016-01-29: 60 mg via ORAL
  Filled 2016-01-28: qty 1

## 2016-01-28 MED ORDER — AMLODIPINE BESYLATE 5 MG PO TABS
5.0000 mg | ORAL_TABLET | Freq: Every day | ORAL | Status: DC
  Administered 2016-01-29: 5 mg via ORAL
  Filled 2016-01-28: qty 1

## 2016-01-28 MED ORDER — LIDOCAINE HCL (CARDIAC) 20 MG/ML IV SOLN
INTRAVENOUS | Status: DC | PRN
  Administered 2016-01-28: 30 mg via INTRAVENOUS

## 2016-01-28 MED ORDER — ACETAMINOPHEN 325 MG PO TABS
325.0000 mg | ORAL_TABLET | ORAL | Status: DC | PRN
  Administered 2016-01-28: 650 mg via ORAL
  Filled 2016-01-28: qty 2

## 2016-01-28 MED ORDER — DOCUSATE SODIUM 100 MG PO CAPS
100.0000 mg | ORAL_CAPSULE | Freq: Every day | ORAL | Status: DC
  Filled 2016-01-28: qty 1

## 2016-01-28 MED ORDER — PROPOFOL 500 MG/50ML IV EMUL
INTRAVENOUS | Status: DC | PRN
  Administered 2016-01-28: 75 ug/kg/min via INTRAVENOUS

## 2016-01-28 MED ORDER — SODIUM CHLORIDE 0.9 % IR SOLN
Status: DC | PRN
  Administered 2016-01-28: 2 mL

## 2016-01-28 MED ORDER — PHENYLEPHRINE HCL 10 MG/ML IJ SOLN
INTRAMUSCULAR | Status: DC | PRN
  Administered 2016-01-28: 100 ug via INTRAVENOUS

## 2016-01-28 MED ORDER — LIDOCAINE 1 % OPTIME INJ - NO CHARGE
INTRAMUSCULAR | Status: DC | PRN
  Administered 2016-01-28: 30 mL

## 2016-01-28 MED ORDER — LISINOPRIL 20 MG PO TABS
20.0000 mg | ORAL_TABLET | Freq: Every day | ORAL | Status: DC
  Administered 2016-01-29: 20 mg via ORAL
  Filled 2016-01-28: qty 1

## 2016-01-28 SURGICAL SUPPLY — 37 items
BAG DECANTER FOR FLEXI CONT (MISCELLANEOUS) ×3 IMPLANT
BRUSH SCRUB 4% CHG (MISCELLANEOUS) ×3 IMPLANT
CABLE SURG 12 DISP A/V CHANNEL (MISCELLANEOUS) ×6 IMPLANT
CANISTER SUCT 1200ML W/VALVE (MISCELLANEOUS) ×3 IMPLANT
CHLORAPREP W/TINT 26ML (MISCELLANEOUS) ×3 IMPLANT
COVER LIGHT HANDLE STERIS (MISCELLANEOUS) ×6 IMPLANT
COVER MAYO STAND STRL (DRAPES) ×3 IMPLANT
DEVICE DISSECT PLASMABLAD 3.0S (MISCELLANEOUS) ×1 IMPLANT
DRAPE C-ARM XRAY 36X54 (DRAPES) ×3 IMPLANT
DRESSING TELFA 4X3 1S ST N-ADH (GAUZE/BANDAGES/DRESSINGS) ×3 IMPLANT
DRSG TEGADERM 4X4.75 (GAUZE/BANDAGES/DRESSINGS) ×3 IMPLANT
ELECT REM PT RETURN 9FT ADLT (ELECTROSURGICAL) ×3
ELECTRODE REM PT RTRN 9FT ADLT (ELECTROSURGICAL) ×1 IMPLANT
GLOVE BIO SURGEON STRL SZ7.5 (GLOVE) ×3 IMPLANT
GLOVE BIO SURGEON STRL SZ8 (GLOVE) ×3 IMPLANT
GOWN STRL REUS W/ TWL LRG LVL3 (GOWN DISPOSABLE) ×1 IMPLANT
GOWN STRL REUS W/ TWL XL LVL3 (GOWN DISPOSABLE) ×1 IMPLANT
GOWN STRL REUS W/TWL LRG LVL3 (GOWN DISPOSABLE) ×2
GOWN STRL REUS W/TWL XL LVL3 (GOWN DISPOSABLE) ×2
IMMOBILIZER SHDR MD LX WHT (SOFTGOODS) IMPLANT
IMMOBILIZER SHDR XL LX WHT (SOFTGOODS) IMPLANT
INTRO PACEMAKR LEAD 9FR 13CM (INTRODUCER) ×3
INTRO PACEMKR SHEATH II 7FR (MISCELLANEOUS) ×3
INTRODUCER PACEMKR LD 9FR 13CM (INTRODUCER) ×1 IMPLANT
INTRODUCER PACEMKR SHTH II 7FR (MISCELLANEOUS) ×1 IMPLANT
IV NS 500ML (IV SOLUTION) ×2
IV NS 500ML BAXH (IV SOLUTION) ×1 IMPLANT
KIT RM TURNOVER STRD PROC AR (KITS) ×3 IMPLANT
LABEL OR SOLS (LABEL) ×3 IMPLANT
LEAD CAPSURE NOVUS 5076-52CM (Lead) ×3 IMPLANT
MARKER SKIN DUAL TIP RULER LAB (MISCELLANEOUS) ×3 IMPLANT
PACK PACE INSERTION (MISCELLANEOUS) ×3 IMPLANT
PAD STATPAD (MISCELLANEOUS) ×3 IMPLANT
PLASMABLADE 3.0S (MISCELLANEOUS) ×3
PPM'ADVISA SR MRI A3SR01 (Pacemaker) ×1 IMPLANT
PPMADVISA SR MRI A3SR01 (Pacemaker) ×2 IMPLANT
SUT SILK 0 SH 30 (SUTURE) ×9 IMPLANT

## 2016-01-28 NOTE — Anesthesia Postprocedure Evaluation (Signed)
Anesthesia Post Note  Patient: Leah Holmes  Procedure(s) Performed: Procedure(s) (LRB): INSERTION PACEMAKER (N/A)  Patient location during evaluation: PACU Anesthesia Type: General Level of consciousness: awake and alert and oriented Pain management: pain level controlled Vital Signs Assessment: post-procedure vital signs reviewed and stable Respiratory status: spontaneous breathing, nonlabored ventilation and respiratory function stable Cardiovascular status: blood pressure returned to baseline and stable Postop Assessment: no signs of nausea or vomiting Anesthetic complications: no    Last Vitals:  Vitals:   01/28/16 1438 01/28/16 1453  BP: 134/64 133/73  Pulse: (!) 53 69  Resp: 13 12  Temp:  36.9 C    Last Pain:  Vitals:   01/28/16 1232  TempSrc: Tympanic                 Airel Magadan

## 2016-01-28 NOTE — H&P (Signed)
<6>29299-5<7>Encounter Details<6>46240-8<7>Social History<6>29762-2<7>Last Filed Vital Signs<6>8716-3<7>Progress Notes<6>10164-2<7>Plan of Treatment<6>18776-5<7>ECG Results<6>18844-1<7>Visit Diagnoses<6>51848-0<7>/"> Jump to Section ? Document InformationECG ResultsEncounter DetailsLast Filed Vital SignsPatient DemographicsPlan of TreatmentProgress NotesReason for VisitSocial HistoryVisit Diagnoses Leah Holmes Encounter Summary, generated on Aug. 15, 2017 Printout Information  Document Contents Office Visit Document Received Date Aug. 15, 2017 [place]   Patient Demographics - 82 y.o. Female, born 06/26/1926   Patient Address Communication Language Race / Ethnicity  Leah Holmes, [place] [address] [phone] [facility]) [phone] (Home) Leah Holmes English (Preferred) White / Not Hispanic or Latino  Reason for Visit    Reason Comments  Tachycardia   Fatigue SHAKY. JITTERY. FELT FAINT. 120/56. SOB. Leah Holmes     Encounter Details    Date Type Department Care Team Description  01/17/2016 Office Visit [facility]  [address] [place], [place] [address]  [phone]  Leah Holmes, [place]  [facility]  [place],  [address]  [phone]  [phone] (Fax)  Chronic atrial fibrillation (CMS-HCC) (Primary Dx)   Social History - as of this encounter   Tobacco Use Types Packs/Day Years Used Date  Never Smoker      Smokeless Tobacco: Never Used      Alcohol Use Drinks/Week oz/Week Comments  No      Sex Assigned at Birth Date Recorded  Not on file    Last Filed Vital Signs - in this encounter   Vital Sign Reading Time Taken  Blood Pressure 100/50 01/17/2016 11:21 AM EDT  Pulse 86 01/17/2016 11:21 AM EDT  Temperature - -  Respiratory Rate - -  Oxygen Saturation 97% 01/17/2016 11:21 AM EDT  Inhaled Oxygen  Concentration - -  Weight 62.6 kg (138 lb) 01/17/2016 11:21 AM EDT  Height 167.6 cm (5\' 6" ) 01/17/2016 11:21 AM EDT  Body Mass Index 22.27 01/17/2016 11:21 AM EDT   Progress Notes - in this encounter   Leah Dibble, MD - 01/17/2016 11:00 AM EDT Formatting of this note may be different from the original. Established Patient Visit   Chief Complaint: Chief Complaint  Patient presents with  . Tachycardia  . Fatigue  SHAKY. JITTERY. FELT FAINT. 120/56. SOB. TOOK NITRO  Date of Service: 01/17/2016 Date of Birth: 1927/05/21 PCP: Jannifer Hick, MD  History of Present Illness: [name] is a 82 y.o.female patient  Sick sinus syndrome The patient has had persistant non valvular atrial fibrillation for years without pacemaker placement with symptoms including fatigue, irregular heart beat and near-syncope worsening with increased severity and frequency on medications including none. The patient has causes and risk factors of atrial fibrillation including hypertension, coronary artery disease, valve disease and structural heart disease and or dysfunction. The identified symptoms associated with atrial fibrillation have altered the patient's quality of life. The patient has been on anticoagulation for further risk reduction of cardiovascular event and/or stroke Holter The holter monitor shows occasional PVCs, ventricular bigeminy, PACs, atrial fibrillation, sick sinus syndrome and symptomatic bradycardia  Echocardiogram The patient has had an echocardiogram showing Normal LV LVEF > 55% Mild MR Moderate TR with pulmonary hypertension Stress Test Persantine SESTAMIBI was performed showing Normal test.   Past Medical and Surgical History  Past Medical History Past Medical History:  Diagnosis Date  . Atrial fibrillation (CMS-HCC)  . Coronary artery disease  . Diabetes mellitus type 2, uncomplicated (CMS-HCC)  . GERD (gastroesophageal reflux disease)  . Hyperlipidemia  . Hypertension   . LBBB (  left bundle branch block)  . Mild concentric left ventricular hypertrophy (LVH)  . VHD (valvular heart disease)   Past Surgical History She has a past surgical history that includes Cardiac catheterization (2012) and PCI and stent placement.   Medications and Allergies  Current Medications  Current Outpatient Prescriptions  Medication Sig Dispense Refill  . amLODIPine (NORVASC) 5 MG tablet Take 5 mg by mouth once daily.   Marland Kitchen aspirin 81 MG EC tablet Take 81 mg by mouth once daily.  . cholecalciferol (VITAMIN D3) 1,000 unit tablet Take 1,000 Units by mouth once daily.  Marland Kitchen docusate (COLACE) 50 MG capsule Take by mouth once daily.  . hydroCHLOROthiazide (HYDRODIURIL) 25 MG tablet TAKE ONE TABLET BY MOUTH ONCE DAILY 30 tablet 0  . isosorbide mononitrate (IMDUR) 60 MG ER tablet TAKE 1 TABLET BY MOUTH TWICE DAILY 60 tablet 6  . lisinopril (PRINIVIL,ZESTRIL) 20 MG tablet Take 20 mg by mouth 2 (two) times daily.  . meclizine (ANTIVERT) 25 mg tablet Take 25 mg by mouth 3 (three) times daily as needed.   . nitroGLYcerin (NITROSTAT) 0.4 MG SL tablet DISSOLVE ONE TABLET UNDER THE TONGUE EVERY 5 MINUTES AS NEEDED FOR CHEST PAIN. DO NOT EXCEED A TOTAL OF 3 DOSES IN 15 MINUTES 25 tablet 0  . omeprazole (PRILOSEC) 20 MG DR capsule Take 20 mg by mouth once daily.   . psyllium husk, with sugar, (METAMUCIL) 3.4 gram/12 gram oral powder Take 3.4 g by mouth 2 (two) times daily. Mix with a full glass (240 mL) of fluid.  Marland Kitchen warfarin (COUMADIN) 2 MG tablet TAKE TWO TABLETS BY MOUTH ONCE NIGHTLY 60 tablet 3   No current facility-administered medications for this visit.   Allergies: Clonidine (pf); Codeine; Gabapentin; Lipitor [atorvastatin]; Losartan; Morphine (bulk); Penicillins; Reglan [metoclopramide hcl]; and Hydralazine  Social and Family History  Social History reports that she has never smoked. She has never used smokeless tobacco. She reports that she does not drink alcohol or use illicit  drugs.  Family History Family History  Problem Relation Age of Onset  . No Known Problems Mother  . Heart disease Father   Review of Systems   Review of Systems  Positive for dizziness pre syncope Negative for weight gain weight loss, vision change, hearing loss, cough, congestion, PND, orthopnea, heartburn, nausea, diaphoresis, vomiting, diarrhea, bloody stool, melena, stomach pain, extremity pain, leg weakness, leg cramping, leg blood clots, headache, nosebleed, trouble swallowing, mouth pain, urinary frequency, urination at night, muscle weakness, skin lesions, skin rashes, tingling ,ulcers, numbness, anxiety, and/or depression Physical Examination   Vitals:BP 100/50  Pulse 86  Ht 167.6 cm (5\' 6" )  Wt 62.6 kg (138 lb)  SpO2 97%  BMI 22.27 kg/m2 Ht:167.6 cm (5\' 6" ) Wt:62.6 kg (138 lb) [id number] surface area is 1.71 meters squared. Body mass index is 22.27 kg/(m^2). Appearance: well appearing in no acute distress HEENT: Pupils equally reactive to light and accomodation, no xanthalasma  Neck: Supple, no apparent thyromegaly, masses, or lymphadenopathy  Lungs: normal respiratory effort; no crackles, no rhonchi, no wheezes Heart: irregular rate and rhythm. Normal S1 S2 No gallops, 2+mr murmur, no rub, PMI is normal size and placement. carotid upstroke normal without bruit. Jugular venous pressure is normal Abdomen: soft, nontender, not distended with normal bowel sounds. No apparent hepatosplenomegally. Abdominal aorta is normal size without bruit Extremities: no edema, no ulcers, no clubbing, no cyanosis Peripheral Pulses: 2+ in upper extremities, 2+ femoral pulses bilaterally, 2+lower extremity  Musculoskeletal; Normal muscle tone without kyphosis Neurological:  Oriented and Alert, Cranial nerves intact  Assessment   82 y.o. female with  Encounter Diagnosis  Name Primary?  . Chronic atrial fibrillation (CMS-HCC) Yes   Plan  -The patient is to have consultation and permanent  pacemaker placement for sick sinus syndrome and symptomatic bradycardia. The patient understands all risks and benefits of permanent pacemaker placement. This includes the possibility of death, stroke, heart attack, hemopericardium, pneumothorax, infection, bleeding, blood clot, and reaction to medications. The patient is at low risk for general anesthesia  Orders Placed This Encounter  Procedures  . ECG 12-lead   Return in about 4 weeks (around 02/14/2016).  Leah Dibble, MD      Plan of Treatment - as of this encounter   Upcoming Encounters Upcoming Encounters  Date Type Specialty Care Team Description  02/04/2016 Ancillary Orders Lab     ECG Results - in this encounter    ECG 12-lead (01/17/2016 11:48 AM) ECG 12-lead (01/17/2016 11:48 AM)  Component Value Ref Range  Vent Rate (bpm) 85   QRS Interval (msec) 128   QT Interval (msec) 414   QTc (msec) 492    ECG 12-lead (01/17/2016 11:48 AM)  Specimen Performing Laboratory   [facility] GE MUSE RESULTS    ECG 12-lead (01/17/2016 11:48 AM)  Narrative  Atrial fibrillation  Left axis deviation  Left bundle branch block  Abnormal ECG  When compared with ECG of 09-Jan-2016 09:07,  Vent. rate has increased BY29 BPM  I reviewed and concur with this report. Electronically signed [id number] MD, Darnell Level [phone]) on 01/21/2016 1:22:21 PM     Visit Diagnoses - in this encounter   Diagnosis  Chronic atrial fibrillation (CMS-HCC) - Primary  Chronic atrial fibrillation    Document Information   Service Providers Document Coverage Dates Aug. 04, 2017  [place] [phone] (Work) [place], [place] [address]   Encounter Providers Bruce Lenn Sink MD (Attending) tel:[phone] (Work) fax:[phone] [address] [facility] [place], [place] [address]   Encounter Date Aug. 04, 2017   Show All Sections

## 2016-01-28 NOTE — Transfer of Care (Signed)
Immediate Anesthesia Transfer of Care Note  Patient: Leah Holmes  Procedure(s) Performed: Procedure(s): INSERTION PACEMAKER (N/A)  Patient Location: PACU  Anesthesia Type:General  Level of Consciousness: sedated  Airway & Oxygen Therapy: Patient Spontanous Breathing and Patient connected to face mask oxygen  Post-op Assessment: Report given to RN and Post -op Vital signs reviewed and stable  Post vital signs: Reviewed and stable  Last Vitals:  Vitals:   01/28/16 1232  BP: (!) 141/64  Pulse: 69  Resp: 20  Temp: 36.8 C    Last Pain:  Vitals:   01/28/16 1232  TempSrc: Tympanic         Complications: No apparent anesthesia complications

## 2016-01-28 NOTE — Anesthesia Preprocedure Evaluation (Addendum)
Anesthesia Evaluation  Patient identified by MRN, date of birth, ID band Patient awake    Reviewed: Allergy & Precautions, NPO status , Patient's Chart, lab work & pertinent test results  History of Anesthesia Complications (+) PONV and history of anesthetic complications  Airway Mallampati: II  TM Distance: >3 FB Neck ROM: Full    Dental  (+) Lower Dentures, Upper Dentures   Pulmonary neg pulmonary ROS, neg COPD,    breath sounds clear to auscultation- rhonchi (-) decreased breath sounds(-) wheezing      Cardiovascular hypertension, + CAD (medical management) and +CHF (preserved EF)  + dysrhythmias (symptomatic bradycardia) Atrial Fibrillation  Rhythm:Irregular Rate:Normal - Systolic murmurs and - Diastolic murmurs Holter The holter monitor shows occasional PVCs, ventricular bigeminy, PACs, atrial fibrillation, sick sinus syndrome and symptomatic bradycardia  Echocardiogram The patient has had an echocardiogram showing Normal LV LVEF > 55% Mild MR Moderate TR with pulmonary hypertension Stress Test Persantine SESTAMIBI was performed showing Normal test.        Neuro/Psych Depression negative neurological ROS     GI/Hepatic Neg liver ROS, GERD  ,  Endo/Other  diabetes  Renal/GU negative Renal ROS     Musculoskeletal negative musculoskeletal ROS (+)   Abdominal Normal abdominal exam  (+)   Peds  Hematology negative hematology ROS (+)   Anesthesia Other Findings Past Medical History: 2012: Atrial fibrillation (HCC) No date: CHF (congestive heart failure) ([place]) 2003: Colon polyp No date: Coronary artery disease No date: Diabetes mellitus without complication (HCC) No date: GERD (gastroesophageal reflux disease) No date: Hyperlipidemia No date: Hypertension No date: Irritable bowel syndrome No date: Polyp of colon No date: PONV (postoperative nausea and vomiting)     Comment: with ether, not recently No  date: Ulcer   Reproductive/Obstetrics negative OB ROS                            Anesthesia Physical Anesthesia Plan  ASA: III  Anesthesia Plan: General   Post-op Pain Management:    Induction: Intravenous  Airway Management Planned: Natural Airway  Additional Equipment:   Intra-op Plan:   Post-operative Plan:   Informed Consent: I have reviewed the patients History and Physical, chart, labs and discussed the procedure including the risks, benefits and alternatives for the proposed anesthesia with the patient or authorized representative who has indicated his/her understanding and acceptance.   Dental advisory given  Plan Discussed with: CRNA and Anesthesiologist  Anesthesia Plan Comments:         Anesthesia Quick Evaluation

## 2016-01-28 NOTE — Progress Notes (Signed)
Patient admitted today from home with her daughter for a pacemaker insertion. Site is clean dry and intact. No complaints of pain. Patient has ambulated with standby assist with her cane to the bathroom. Currently underlying afib on tele with pacer spikes. Vitals are stable. Will continue to monitor.

## 2016-01-28 NOTE — Interval H&P Note (Signed)
History and Physical Interval Note:  01/28/2016 10:34 AM  Leah Holmes  has presented today for surgery, with the diagnosis of CHRONIC A-FIB  The various methods of treatment have been discussed with the patient and family. After consideration of risks, benefits and other options for treatment, the patient has consented to  Procedure(s): INSERTION PACEMAKER (N/A) as a surgical intervention .  The patient's history has been reviewed, patient examined, no change in status, stable for surgery.  I have reviewed the patient's chart and labs.  Questions were answered to the patient's satisfaction.     Myrah Strawderman

## 2016-01-28 NOTE — Op Note (Signed)
[facility]   01/28/2016                     2:07 PM  PATIENT:  Leah Holmes    PRE-OPERATIVE DIAGNOSIS:  CHRONIC A-FIB  POST-OPERATIVE DIAGNOSIS:  Same  PROCEDURE:  INSERTION PACEMAKER  SURGEON:  Makenley Shimp, MD    ANESTHESIA:     PREOPERATIVE INDICATIONS:  [name] is a  82 y.o. female with a diagnosis of CHRONIC A-FIB who failed conservative measures and elected for surgical management.    The risks benefits and alternatives were discussed with the patient preoperatively including but not limited to the risks of infection, bleeding, cardiopulmonary complications, the need for revision surgery, among others, and the patient was willing to proceed.   OPERATIVE PROCEDURE: The patient was brought to the operating room the fasting state. The left pectoral region was prepped and draped in usual sterile manner. A 6 cm incision was performed a left pectoral region. The pacemaker pocket was generated by electrocautery and blunt dissection. Access was obtained to left subclavian vein by fine needle aspiration. MRI compatible lead was positioned to right ventricular apical septum. After proper thresholds were obtained the lead was sutured in place. The pacemaker pocket was irrigated with gentamicin solution. The lead was connected to a single chamber rate responsive MRI compatible pacemaker generator  ( medtromic [id number] ), and positioned into the pocket. The pocket was closed with 2-0 and 4-0 Vicryl, respectively. Steri-Strips and a pressure dressing were applied. There were no procedural complications.

## 2016-01-29 ENCOUNTER — Encounter: Payer: Self-pay | Admitting: Cardiology

## 2016-01-29 DIAGNOSIS — I482 Chronic atrial fibrillation: Secondary | ICD-10-CM | POA: Diagnosis not present

## 2016-01-29 MED ORDER — CLARITHROMYCIN 250 MG PO TABS: 250.0000 mg | ORAL_TABLET | Freq: Two times a day (BID) | ORAL | 0 refills | Status: DC

## 2016-01-29 NOTE — Care Management Obs Status (Signed)
[place] NOTIFICATION   Patient Details  Name: Yanessa Threats MRN: [id number] Date of Birth: [birth year]   Medicare Observation Status Notification Given:  Yes    Jolly Mango, RN 01/29/2016, 9:37 AM

## 2016-01-29 NOTE — Progress Notes (Signed)
Discharge instructions given. IV's and tele removed. Prescription given to patient as well as education on care after for pacemaker. Questions answered, patient verbalized understanding. Daughter is here to take her home.

## 2016-01-29 NOTE — Discharge Instructions (Signed)
Resume warfarin 01/31/2016

## 2016-01-30 ENCOUNTER — Ambulatory Visit: Payer: PRIVATE HEALTH INSURANCE | Admitting: Internal Medicine

## 2016-02-05 ENCOUNTER — Ambulatory Visit
Admission: RE | Admit: 2016-02-05 | Discharge: 2016-02-05 | Disposition: A | Discharge status: Home / Self Care | Payer: Medicare Other | Source: Ambulatory Visit | Attending: Internal Medicine | Admitting: Internal Medicine

## 2016-02-05 ENCOUNTER — Other Ambulatory Visit: Payer: Self-pay | Admitting: Internal Medicine

## 2016-02-05 DIAGNOSIS — M7989 Other specified soft tissue disorders: Secondary | ICD-10-CM | POA: Diagnosis not present

## 2016-02-05 DIAGNOSIS — M79605 Pain in left leg: Secondary | ICD-10-CM | POA: Insufficient documentation

## 2016-02-05 DIAGNOSIS — I82402 Acute embolism and thrombosis of unspecified deep veins of left lower extremity: Secondary | ICD-10-CM

## 2016-02-05 DIAGNOSIS — I824Z2 Acute embolism and thrombosis of unspecified deep veins of left distal lower extremity: Secondary | ICD-10-CM | POA: Diagnosis not present

## 2016-02-05 DIAGNOSIS — M79662 Pain in left lower leg: Secondary | ICD-10-CM

## 2016-02-05 DIAGNOSIS — G252 Other specified forms of tremor: Secondary | ICD-10-CM | POA: Diagnosis not present

## 2016-02-05 DIAGNOSIS — I4891 Unspecified atrial fibrillation: Secondary | ICD-10-CM | POA: Diagnosis not present

## 2016-02-05 DIAGNOSIS — R0602 Shortness of breath: Secondary | ICD-10-CM | POA: Diagnosis not present

## 2016-02-19 ENCOUNTER — Other Ambulatory Visit: Payer: Self-pay | Admitting: Internal Medicine

## 2016-02-19 ENCOUNTER — Ambulatory Visit: Payer: PRIVATE HEALTH INSURANCE

## 2016-02-27 DIAGNOSIS — I495 Sick sinus syndrome: Secondary | ICD-10-CM | POA: Diagnosis not present

## 2016-03-04 DIAGNOSIS — I4891 Unspecified atrial fibrillation: Secondary | ICD-10-CM | POA: Diagnosis not present

## 2016-03-09 ENCOUNTER — Ambulatory Visit (INDEPENDENT_AMBULATORY_CARE_PROVIDER_SITE_OTHER): Payer: Medicare Other | Admitting: Internal Medicine

## 2016-03-09 VITALS — BP 110/64 | HR 64 | Temp 97.6°F | Resp 10 | Ht 63.0 in | Wt 141.8 lb

## 2016-03-09 DIAGNOSIS — E119 Type 2 diabetes mellitus without complications: Secondary | ICD-10-CM | POA: Diagnosis not present

## 2016-03-09 DIAGNOSIS — F411 Generalized anxiety disorder: Secondary | ICD-10-CM

## 2016-03-09 DIAGNOSIS — E559 Vitamin D deficiency, unspecified: Secondary | ICD-10-CM | POA: Diagnosis not present

## 2016-03-09 DIAGNOSIS — I872 Venous insufficiency (chronic) (peripheral): Secondary | ICD-10-CM | POA: Diagnosis not present

## 2016-03-09 DIAGNOSIS — R42 Dizziness and giddiness: Secondary | ICD-10-CM | POA: Diagnosis not present

## 2016-03-09 DIAGNOSIS — I10 Essential (primary) hypertension: Secondary | ICD-10-CM

## 2016-03-09 NOTE — Patient Instructions (Addendum)
Please  Start checking  your sugars once daily at various times:   -Fasting    -2 hours after any meal    -Bedtime   Start wearing compression stockings daily .  [facility] will fit you for them   [email or web address] has a good supply as well on line

## 2016-03-09 NOTE — Progress Notes (Addendum)
Subjective:  Patient ID: Leah Holmes, female    DOB: [birth year]  Age: 82 y.o. MRN: [id number]  CC: The primary encounter diagnosis was Venous insufficiency. Diagnoses of Dizziness and giddiness, Vitamin D deficiency, Diabetes mellitus without complication ([place]), Essential hypertension, benign, and Generalized anxiety disorder were also pertinent to this visit.  HPI Leah Holmes presents for follow up on type 2 DM and other issues.  Llast seen in May for wellness exam  Underwent Pacemaker insertion in August by [name] after admission in July for symptomatic bradycardia and chronic atrial fibrillation.  . Atenolol ,  hctz and Imdur were discontinued and hctz. , however it appears that she continues to take Imdur ,  Still under activity restrictions regarding left arm.  Has an exposure suture at the lateral edge of surgical incision that was not trimmed close enough to the skin and is aggravating her.   Multiple complaints,  Some chronic:   Left ear feels full. Has to autosufflate it several times daily  No pain ,  No sinus congestion.  No dizziness.   Still fatigued and feels bad all the time.  At last visit we agreed to start taking lexapro, but she never started started it   Had a hematoma that nearly covered  her left lower leg when a spider vein was bumped and bled under the skin.  Was evaluated with ultrasound, No DVT.  Bruising now resovled but has a few "tender " places on leg where her varicosities are located.   DM:  glipizide was stopped at her last visit due to recurrent lows.  She stopped checking  sugars  Until the last few weeks and notes that last several times she checked it the readings were between 160 and 220.  Has not resumed glipizide. .   Legs feeling numb and burn at night but not every night      Foot exam normal  Except for overgrown thick toenails   Outpatient Medications Prior to Visit  Medication Sig Dispense Refill  . amLODipine (NORVASC) 5 MG tablet  TAKE ONE TABLET BY MOUTH ONCE DAILY (Patient taking differently: TAKE ONE TABLET (5 mg) BY MOUTH every morning) 90 tablet 3  . aspirin EC 81 MG tablet Take 81 mg by mouth daily. In am.    . Cholecalciferol (VITAMIN D3) 1000 UNITS CAPS Take 1,000 Units by mouth daily. In am.    . docusate sodium (COLACE) 50 MG capsule Take 50-100 mg by mouth daily as needed for mild constipation.     Marland Kitchen glucose blood (ONE TOUCH ULTRA TEST) test strip Check sugar bid Dx E11.9 100 each 5  . hydrochlorothiazide (HYDRODIURIL) 25 MG tablet Take 25 mg by mouth every morning.    . isosorbide mononitrate (IMDUR) 60 MG 24 hr tablet Take 60 mg by mouth 2 (two) times daily.    Marland Kitchen lisinopril (PRINIVIL,ZESTRIL) 20 MG tablet TAKE ONE TABLET BY MOUTH TWICE DAILY...DOSE CHANGE 60 tablet 0  . meclizine (ANTIVERT) 25 MG tablet TAKE ONE TABLET BY MOUTH THREE TIMES DAILY AS NEEDED 30 tablet 5  . nitroGLYCERIN (NITROSTAT) 0.4 MG SL tablet Place 0.4 mg under the tongue every 5 (five) minutes as needed for chest pain.    Marland Kitchen omeprazole (PRILOSEC) 20 MG capsule Take 20 mg by mouth daily.    [name] DELICA LANCETS 99991111 MISC USE ONE LANCET TO CHECK BLOOD SUGAR TWICE DAILY  Dx Code E11.9 100 each 0  . psyllium (METAMUCIL) 58.6 % packet Take 1 packet  by mouth daily as needed (for constipation).     . warfarin (COUMADIN) 2 MG tablet Take 4 mg by mouth daily at 6 PM.     . escitalopram (LEXAPRO) 10 MG tablet Take 1 tablet (10 mg total) by mouth daily. (Patient not taking: Reported on 03/09/2016) 30 tablet 2  . clarithromycin (BIAXIN) 250 MG tablet Take 1 tablet (250 mg total) by mouth 2 (two) times daily. (Patient not taking: Reported on 03/09/2016) 14 tablet 0  . ergocalciferol (DRISDOL) 50000 UNITS capsule Take 1 capsule (50,000 Units total) by mouth once a week. (Patient not taking: Reported on 03/09/2016) 12 capsule 0   No facility-administered medications prior to visit.     Review of Systems;  Patient denies headache, fevers, malaise,  unintentional weight loss, skin rash, eye pain, sinus congestion and sinus pain, sore throat, dysphagia,  hemoptysis , cough, dyspnea, wheezing, chest pain, palpitations, orthopnea, edema, abdominal pain, nausea, melena, diarrhea, constipation, flank pain, dysuria, hematuria, urinary  Frequency, nocturia, numbness, tingling, seizures,  Focal weakness, Loss of consciousness,  Tremor, insomnia, depression, anxiety, and suicidal ideation.      Objective:  BP 110/64   Pulse 64   Temp 97.6 F (36.4 C) (Oral)   Resp 10   Ht 5\' 3"  (1.6 m)   Wt 141 lb 12 oz (64.3 kg)   SpO2 97%   BMI 25.11 kg/m   BP Readings from Last 3 Encounters:  03/09/16 110/64  01/29/16 (!) 141/72  01/21/16 122/60    Wt Readings from Last 3 Encounters:  03/09/16 141 lb 12 oz (64.3 kg)  01/28/16 140 lb 8 oz (63.7 kg)  01/21/16 140 lb (63.5 kg)    General appearance: alert, cooperative and appears stated age Ears: normal TM's and external ear canals both ears Throat: lips, mucosa, and tongue normal; teeth and gums normal Neck: no adenopathy, no carotid bruit, supple, symmetrical, trachea midline and thyroid not enlarged, symmetric, no tenderness/mass/nodules Back: symmetric, no curvature. ROM normal. No CVA tenderness. Lungs: clear to auscultation bilaterally Heart: regular rate and rhythm, S1, S2 normal, no murmur, click, rub or gallop Chest wall:  Well healing surgical scar left upper chest wall with one exposed suture  Pulses: 2+ and symmetric Skin: Skin color, texture, turgor normal. No rashes or lesions Lymph nodes: Cervical, supraclavicular, and axillary nodes normal.  Lab Results  Component Value Date   HGBA1C 6.8 (H) 03/09/2016   HGBA1C 6.6 (H) 10/30/2015   HGBA1C 6.4 07/24/2015    Lab Results  Component Value Date   CREATININE 0.66 03/09/2016   CREATININE 0.63 01/21/2016   CREATININE 0.65 01/10/2016    Lab Results  Component Value Date   WBC 4.9 03/09/2016   HGB 14.4 03/09/2016   HCT 42.0  03/09/2016   PLT 244.0 03/09/2016   GLUCOSE 124 (H) 03/09/2016   CHOL 204 (H) 10/30/2015   TRIG 94.0 10/30/2015   HDL 57.80 10/30/2015   LDLDIRECT 116.0 10/30/2015   LDLCALC 128 (H) 10/30/2015   ALT 16 03/09/2016   AST 20 03/09/2016   NA 134 (L) 03/09/2016   K 4.0 03/09/2016   CL 97 03/09/2016   CREATININE 0.66 03/09/2016   BUN 11 03/09/2016   CO2 32 03/09/2016   TSH 0.745 01/09/2016   INR 1.85 01/10/2016   HGBA1C 6.8 (H) 03/09/2016   MICROALBUR 1.0 10/30/2015    US Venous Img Lower Unilateral Left  Result Date: 02/05/2016 CLINICAL DATA:  Left leg pain and swelling for 1 week EXAM: LEFT LOWER  EXTREMITY VENOUS DUPLEX ULTRASOUND TECHNIQUE: Doppler venous assessment of the left lower extremity deep venous system was performed, including characterization of spectral flow, compressibility, and phasicity. COMPARISON:  None. FINDINGS: There is complete compressibility of the left common femoral, femoral, and popliteal veins. Doppler analysis demonstrates respiratory phasicity and augmentation of flow with calf compression. No obvious superficial vein or calf vein thrombosis. IMPRESSION: No evidence of left lower extremity DVT. Electronically Signed   By: Marybelle Killings M.D.   On: 02/05/2016 15:12    Assessment & Plan:   Problem List Items Addressed This Visit    Diabetes mellitus without complication ([place])    Recent elevations reported,  But Aq1c is still well controlled.  Will not resume glipizide  unless A1c is over 7.5  Lab Results  Component Value Date   HGBA1C 6.8 (H) 03/09/2016         Relevant Orders   Hemoglobin A1c (Completed)   Comprehensive metabolic panel (Completed)   Essential hypertension, benign    Well controlled on current regimen of amlodipine, lisinopril Imdur  and hctz. Renal function normal, no changes today.  Lab Results  Component Value Date   NA 134 (L) 01/21/2016   K 4.3 01/21/2016   CL 97 (L) 01/21/2016   CO2 31 01/21/2016   Lab Results    Component Value Date   CREATININE 0.63 01/21/2016         Generalized anxiety disorder    Urged to try lexapro again. Patient  prefers to continue to complain  about feeling bad over trying a new medicaiton .        Other Visit Diagnoses    Venous insufficiency    -  Primary   Relevant Orders   For home use only DME Other see comment   Dizziness and giddiness       Relevant Orders   Vitamin B12 (Completed)   CBC with Differential/Platelet (Completed)   Vitamin D deficiency       Relevant Orders   VITAMIN D 25 Hydroxy (Vit-D Deficiency, Fractures) (Completed)      I have discontinued Ms. Thedford's ergocalciferol and clarithromycin. I am also having her start on ergocalciferol. Additionally, I am having her maintain her nitroGLYCERIN, aspirin EC, warfarin, psyllium, ONETOUCH DELICA LANCETS 99991111, glucose blood, Vitamin D3, amLODipine, escitalopram, meclizine, isosorbide mononitrate, omeprazole, hydrochlorothiazide, docusate sodium, and lisinopril.  Meds ordered this encounter  Medications  . ergocalciferol (DRISDOL) 50000 units capsule    Sig: Take 1 capsule (50,000 Units total) by mouth once a week.    Dispense:  12 capsule    Refill:  0    Medications Discontinued During This Encounter  Medication Reason  . ergocalciferol (DRISDOL) 50000 UNITS capsule Completed Course  . clarithromycin (BIAXIN) 250 MG tablet Completed Course    Follow-up: Return in about 3 months (around 06/08/2016) for follow up diabetes.   Crecencio Mc, MD

## 2016-03-10 LAB — VITAMIN B12: VITAMIN B 12: 390 pg/mL (ref 211–911)

## 2016-03-10 LAB — CBC WITH DIFFERENTIAL/PLATELET
BASOS ABS: 0 10*3/uL (ref 0.0–0.1)
Basophils Relative: 0.4 % (ref 0.0–3.0)
EOS ABS: 0.1 10*3/uL (ref 0.0–0.7)
EOS PCT: 2.1 % (ref 0.0–5.0)
HCT: 42 % (ref 36.0–46.0)
HEMOGLOBIN: 14.4 g/dL (ref 12.0–15.0)
Lymphocytes Relative: 27.4 % (ref 12.0–46.0)
Lymphs Abs: 1.3 10*3/uL (ref 0.7–4.0)
MCHC: 34.3 g/dL (ref 30.0–36.0)
MCV: 92.3 fl (ref 78.0–100.0)
MONO ABS: 0.5 10*3/uL (ref 0.1–1.0)
Monocytes Relative: 9.8 % (ref 3.0–12.0)
Neutro Abs: 3 10*3/uL (ref 1.4–7.7)
Neutrophils Relative %: 60.3 % (ref 43.0–77.0)
Platelets: 244 10*3/uL (ref 150.0–400.0)
RBC: 4.55 Mil/uL (ref 3.87–5.11)
RDW: 13.5 % (ref 11.5–15.5)
WBC: 4.9 10*3/uL (ref 4.0–10.5)

## 2016-03-10 LAB — COMPREHENSIVE METABOLIC PANEL
ALK PHOS: 69 U/L (ref 39–117)
ALT: 16 U/L (ref 0–35)
AST: 20 U/L (ref 0–37)
Albumin: 3.8 g/dL (ref 3.5–5.2)
BILIRUBIN TOTAL: 0.5 mg/dL (ref 0.2–1.2)
BUN: 11 mg/dL (ref 6–23)
CALCIUM: 9.4 mg/dL (ref 8.4–10.5)
CO2: 32 meq/L (ref 19–32)
Chloride: 97 mEq/L (ref 96–112)
Creatinine, Ser: 0.66 mg/dL (ref 0.40–1.20)
GFR: 89.93 mL/min (ref >60.00)
Glucose, Bld: 124 mg/dL — ABNORMAL HIGH (ref 70–99)
Potassium: 4 mEq/L (ref 3.5–5.1)
Sodium: 134 mEq/L — ABNORMAL LOW (ref 135–145)
Total Protein: 6.7 g/dL (ref 6.0–8.3)

## 2016-03-10 LAB — HEMOGLOBIN A1C: Hgb A1c MFr Bld: 6.8 % — ABNORMAL HIGH (ref 4.6–6.5)

## 2016-03-10 LAB — VITAMIN D 25 HYDROXY (VIT D DEFICIENCY, FRACTURES): VITD: 19.96 ng/mL — ABNORMAL LOW (ref 30.00–100.00)

## 2016-03-10 NOTE — Assessment & Plan Note (Addendum)
Well controlled on current regimen of amlodipine, lisinopril Imdur  and hctz. Renal function normal, no changes today.  Lab Results  Component Value Date   NA 134 (L) 01/21/2016   K 4.3 01/21/2016   CL 97 (L) 01/21/2016   CO2 31 01/21/2016   Lab Results  Component Value Date   CREATININE 0.63 01/21/2016

## 2016-03-10 NOTE — Assessment & Plan Note (Addendum)
Recent elevations reported,  But Aq1c is still well controlled.  Will not resume glipizide  unless A1c is over 7.5  Lab Results  Component Value Date   HGBA1C 6.8 (H) 03/09/2016

## 2016-03-10 NOTE — Assessment & Plan Note (Signed)
Urged to try lexapro again. Patient  prefers to continue to complain  about feeling bad over trying a new medicaiton .

## 2016-03-12 ENCOUNTER — Ambulatory Visit: Payer: PRIVATE HEALTH INSURANCE | Admitting: Internal Medicine

## 2016-03-12 ENCOUNTER — Encounter: Payer: Self-pay | Admitting: Internal Medicine

## 2016-03-12 MED ORDER — ERGOCALCIFEROL 1.25 MG (50000 UT) PO CAPS: 50000.0000 [IU] | ORAL_CAPSULE | ORAL | 0 refills | Status: DC

## 2016-03-12 NOTE — Addendum Note (Signed)
Addended by: Crecencio Mc on: 03/12/2016 11:01 AM   Modules accepted: Orders

## 2016-03-18 ENCOUNTER — Ambulatory Visit
Admission: RE | Admit: 2016-03-18 | Discharge: 2016-03-18 | Disposition: A | Discharge status: Home / Self Care | Payer: Medicare Other | Source: Ambulatory Visit | Attending: Internal Medicine | Admitting: Internal Medicine

## 2016-03-18 DIAGNOSIS — M810 Age-related osteoporosis without current pathological fracture: Secondary | ICD-10-CM | POA: Insufficient documentation

## 2016-03-18 DIAGNOSIS — Z780 Asymptomatic menopausal state: Secondary | ICD-10-CM | POA: Diagnosis not present

## 2016-03-21 ENCOUNTER — Encounter: Payer: Self-pay | Admitting: Internal Medicine

## 2016-03-21 DIAGNOSIS — M810 Age-related osteoporosis without current pathological fracture: Secondary | ICD-10-CM | POA: Insufficient documentation

## 2016-03-22 ENCOUNTER — Other Ambulatory Visit: Payer: Self-pay | Admitting: Internal Medicine

## 2016-03-23 ENCOUNTER — Other Ambulatory Visit: Payer: Self-pay | Admitting: Internal Medicine

## 2016-03-23 NOTE — Telephone Encounter (Signed)
Yes will send.

## 2016-03-23 NOTE — Telephone Encounter (Signed)
Pt was seen last month, but Omeprazole listed as a "historical med". Okay to refill?

## 2016-03-27 NOTE — Discharge Summary (Signed)
Physician Discharge Summary  Patient ID: Leah Holmes MRN: [id number] DOB/AGE: 07-15-1926 82 y.o.  Admit date: 01/28/2016 Discharge date: 03/27/2016  Primary Discharge Diagnosis Sick sinus syndrome Secondary Discharge Diagnosis atrial fibrillation  Significant Diagnostic Studies: yes  Consults: None  Hospital Course: The patient underwent elective single-chamber pacemaker implantation on 0000000 without complication. She was observed on telemetry, had an uncomplicated hospital course and was discharged home on 01/29/2016.   Discharge Exam: Blood pressure (!) 141/72, pulse 72, temperature 97.8 F (36.6 C), temperature source Oral, resp. rate 16, height 5\' 6"  (1.676 m), weight 63.7 kg (140 lb 8 oz), SpO2 98 %.   General appearance: alert Head: Normocephalic, without obvious abnormality, atraumatic Eyes: conjunctivae/corneas clear. PERRL, EOM's intact. Fundi benign. Ears: normal TM's and external ear canals both ears Nose: Nares normal. Septum midline. Mucosa normal. No drainage or sinus tenderness. Throat: lips, mucosa, and tongue normal; teeth and gums normal Neck: no adenopathy, no carotid bruit, no JVD, supple, symmetrical, trachea midline and thyroid not enlarged, symmetric, no tenderness/mass/nodules Back: symmetric, no curvature. ROM normal. No CVA tenderness. Resp: clear to auscultation bilaterally Chest wall: no tenderness Cardio: regular rate and rhythm, S1, S2 normal, no murmur, click, rub or gallop GI: soft, non-tender; bowel sounds normal; no masses,  no organomegaly Extremities: extremities normal, atraumatic, no cyanosis or edema Pulses: 2+ and symmetric Skin: Skin color, texture, turgor normal. No rashes or lesions Neurologic: Grossly normal Labs:   Lab Results  Component Value Date   WBC 4.9 03/09/2016   HGB 14.4 03/09/2016   HCT 42.0 03/09/2016   MCV 92.3 03/09/2016   PLT 244.0 03/09/2016   No results for input(s): NA, K, CL, CO2, BUN, CREATININE,  CALCIUM, PROT, BILITOT, ALKPHOS, ALT, AST, GLUCOSE in the last 168 hours.  Invalid input(s): LABALBU    Radiology: No pneumothorax EKG: Atrial fibrillation with intermittent ventricular pacing  FOLLOW UP PLANS AND APPOINTMENTS    Medication List    TAKE these medications   amLODipine 5 MG tablet Commonly known as:  NORVASC TAKE ONE TABLET BY MOUTH ONCE DAILY What changed:  See the new instructions.   aspirin EC 81 MG tablet Take 81 mg by mouth daily. In am.   docusate sodium 50 MG capsule Commonly known as:  COLACE Take 50-100 mg by mouth daily as needed for mild constipation.   escitalopram 10 MG tablet Commonly known as:  LEXAPRO Take 1 tablet (10 mg total) by mouth daily.   glucose blood test strip Commonly known as:  ONE TOUCH ULTRA TEST Check sugar bid Dx E11.9   hydrochlorothiazide 25 MG tablet Commonly known as:  HYDRODIURIL Take 25 mg by mouth every morning.   isosorbide mononitrate 60 MG 24 hr tablet Commonly known as:  IMDUR Take 60 mg by mouth 2 (two) times daily.   meclizine 25 MG tablet Commonly known as:  ANTIVERT TAKE ONE TABLET BY MOUTH THREE TIMES DAILY AS NEEDED   nitroGLYCERIN 0.4 MG SL tablet Commonly known as:  NITROSTAT Place 0.4 mg under the tongue every 5 (five) minutes as needed for chest pain.   omeprazole 20 MG capsule Commonly known as:  PRILOSEC Take 20 mg by mouth daily.   ONETOUCH DELICA LANCETS 99991111 Misc USE ONE LANCET TO CHECK BLOOD SUGAR TWICE DAILY  Dx Code E11.9   psyllium 58.6 % packet Commonly known as:  METAMUCIL Take 1 packet by mouth daily as needed (for constipation).   Vitamin D3 1000 units Caps Take 1,000 Units by mouth daily.  In am.   warfarin 2 MG tablet Commonly known as:  COUMADIN Take 4 mg by mouth daily at 6 PM.      Follow-up Information    Corey Skains, MD .   Specialty:  Cardiology Why:  [facility] office location appointment Wednesday, August 23rd at 1130am, [facility] Contact information: [facility] [place] [address] [phone]           BRING ALL MEDICATIONS WITH YOU TO FOLLOW UP APPOINTMENTS  Time spent with patient to include physician time: 25 minutes Signed:  Isaias Cowman MD, PhD, [facility] 03/27/2016, 8:27 AM

## 2016-03-30 DIAGNOSIS — H2511 Age-related nuclear cataract, right eye: Secondary | ICD-10-CM | POA: Diagnosis not present

## 2016-03-30 LAB — HM DIABETES EYE EXAM

## 2016-03-31 DIAGNOSIS — I4891 Unspecified atrial fibrillation: Secondary | ICD-10-CM | POA: Diagnosis not present

## 2016-04-07 ENCOUNTER — Encounter: Payer: Self-pay | Admitting: Internal Medicine

## 2016-04-12 DIAGNOSIS — Z23 Encounter for immunization: Secondary | ICD-10-CM | POA: Diagnosis not present

## 2016-04-17 DIAGNOSIS — E119 Type 2 diabetes mellitus without complications: Secondary | ICD-10-CM | POA: Diagnosis not present

## 2016-04-27 DIAGNOSIS — I4891 Unspecified atrial fibrillation: Secondary | ICD-10-CM | POA: Diagnosis not present

## 2016-05-26 DIAGNOSIS — I4891 Unspecified atrial fibrillation: Secondary | ICD-10-CM | POA: Diagnosis not present

## 2016-06-24 DIAGNOSIS — I4891 Unspecified atrial fibrillation: Secondary | ICD-10-CM | POA: Diagnosis not present

## 2016-07-22 DIAGNOSIS — I4891 Unspecified atrial fibrillation: Secondary | ICD-10-CM | POA: Diagnosis not present

## 2016-08-13 DIAGNOSIS — I4891 Unspecified atrial fibrillation: Secondary | ICD-10-CM | POA: Diagnosis not present

## 2016-08-25 DIAGNOSIS — I495 Sick sinus syndrome: Secondary | ICD-10-CM | POA: Diagnosis not present

## 2016-09-01 DIAGNOSIS — R42 Dizziness and giddiness: Secondary | ICD-10-CM | POA: Diagnosis not present

## 2016-09-01 DIAGNOSIS — I25118 Atherosclerotic heart disease of native coronary artery with other forms of angina pectoris: Secondary | ICD-10-CM | POA: Diagnosis not present

## 2016-09-01 DIAGNOSIS — I495 Sick sinus syndrome: Secondary | ICD-10-CM | POA: Diagnosis not present

## 2016-09-01 DIAGNOSIS — I482 Chronic atrial fibrillation: Secondary | ICD-10-CM | POA: Diagnosis not present

## 2016-09-14 DIAGNOSIS — I4891 Unspecified atrial fibrillation: Secondary | ICD-10-CM | POA: Diagnosis not present

## 2016-09-23 ENCOUNTER — Other Ambulatory Visit: Payer: Self-pay | Admitting: Internal Medicine

## 2016-09-25 IMAGING — US US EXTREM LOW VENOUS*L*
1 series · 14 of 24 positions shown · non-contrast
Comparison: None.

CLINICAL DATA: Left leg pain and swelling for 1 week

EXAM:
LEFT LOWER EXTREMITY VENOUS DUPLEX ULTRASOUND
TECHNIQUE: Doppler venous assessment of the left lower extremity deep venous
system was performed, including characterization of spectral flow,
compressibility, and phasicity.

[Series 1: us extrem low venous*left* · 0.08mm/px · 14 of 42 slices shown]
[im 1/42]
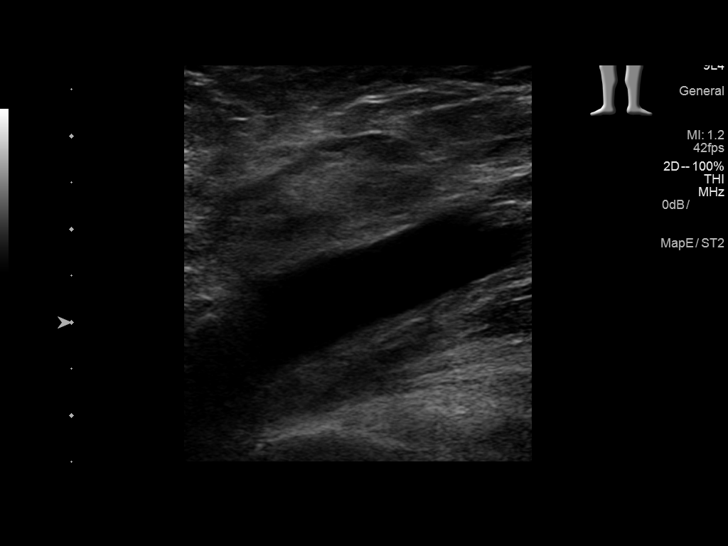
[im 4/42]
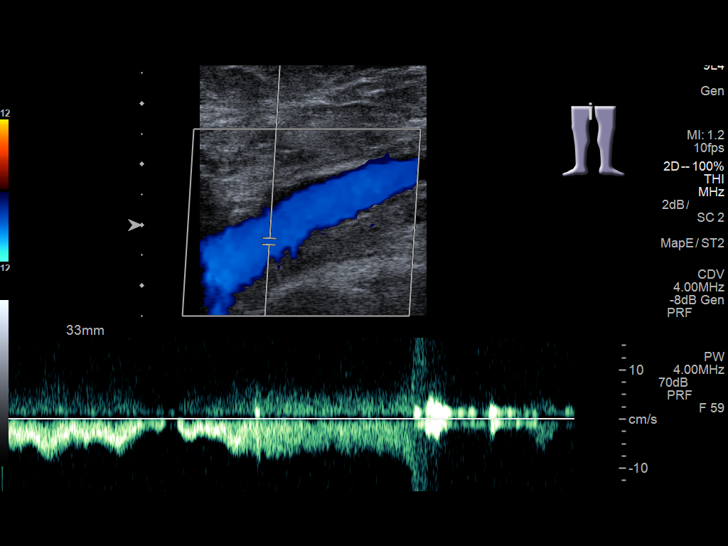
[im 8/42]
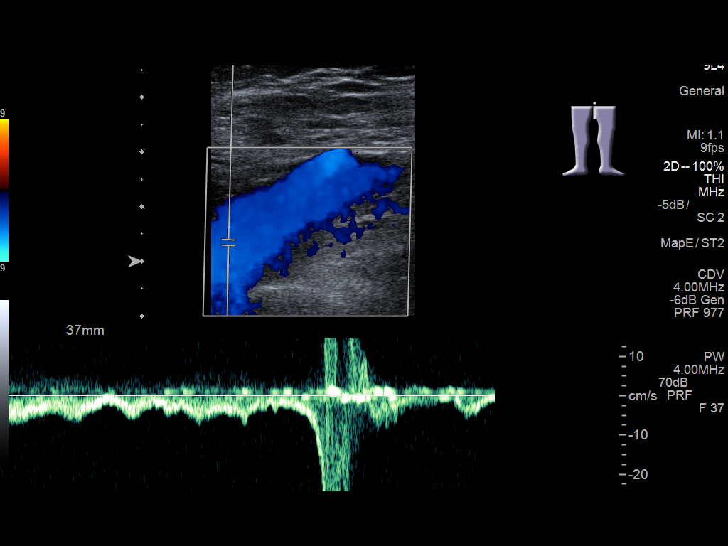
[im 11/42]
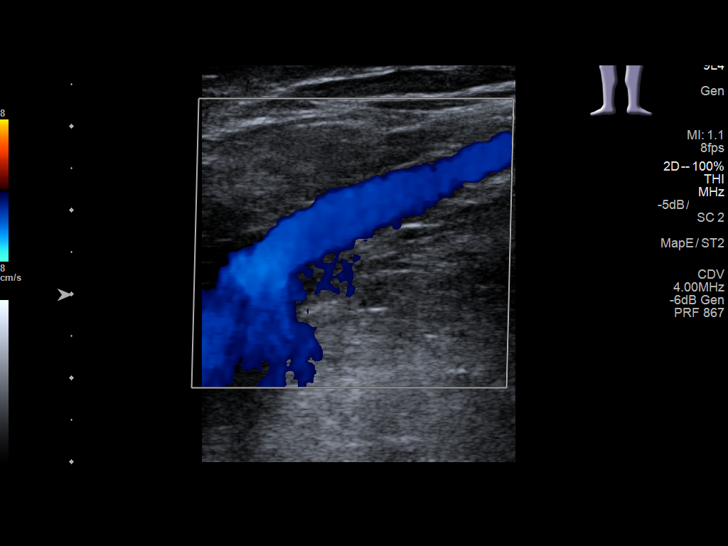
[im 13/42]
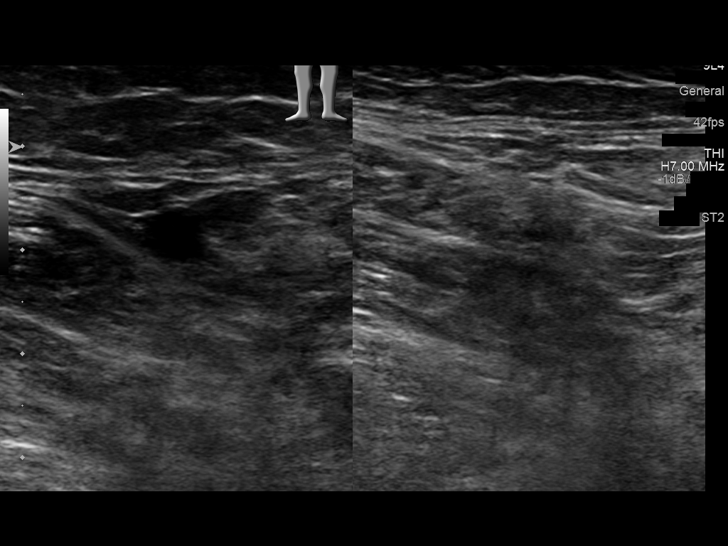
[im 17/42]
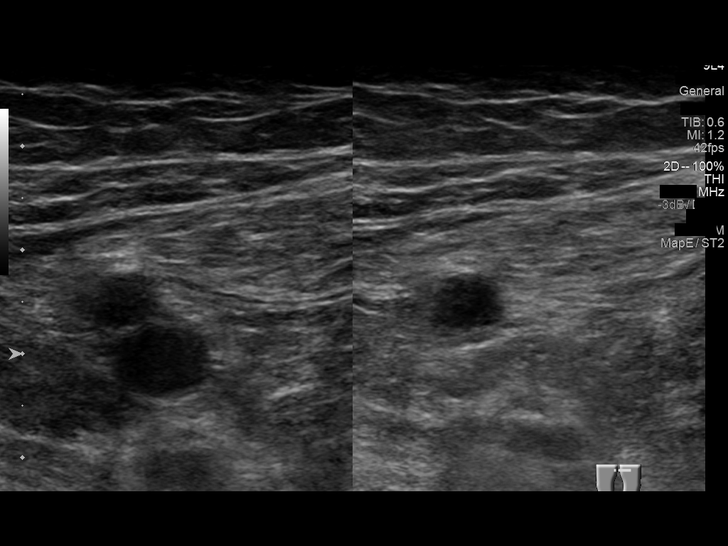
[im 20/42]
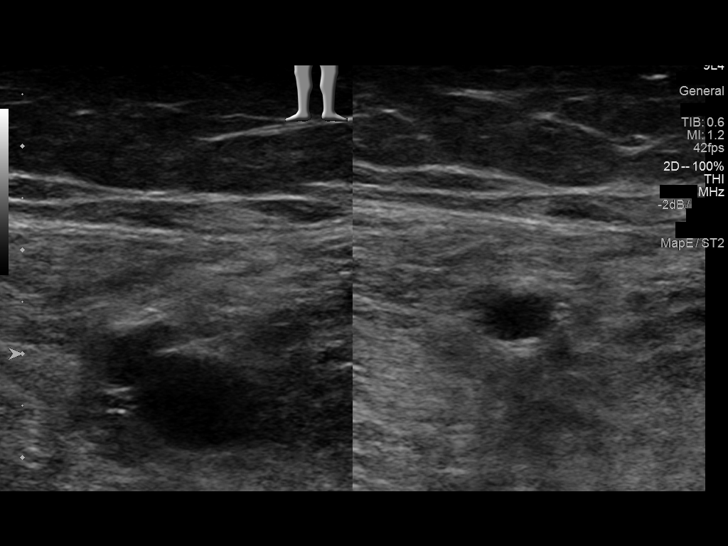
[im 22/42]
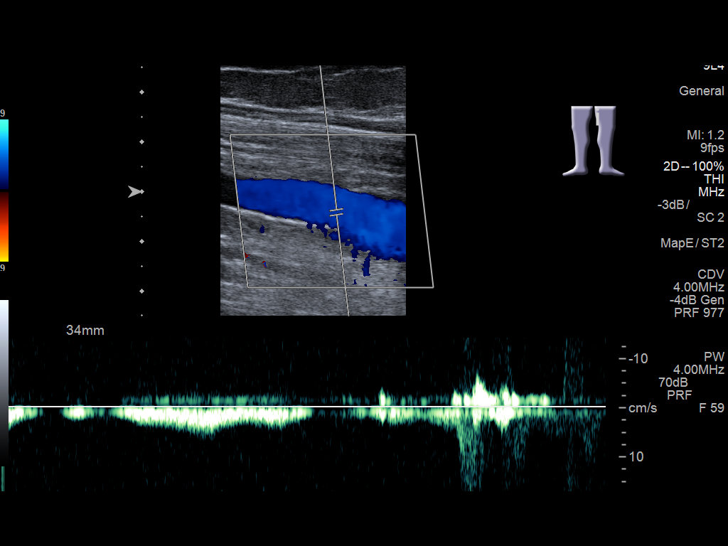
[im 25/42]
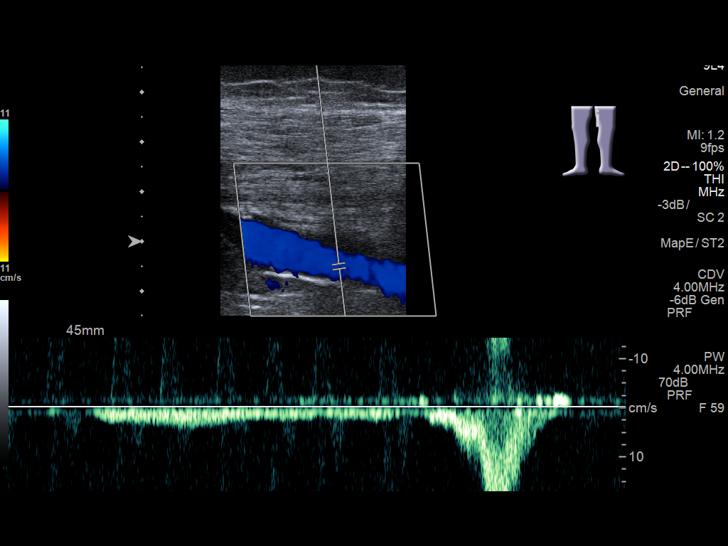
[im 29/42]
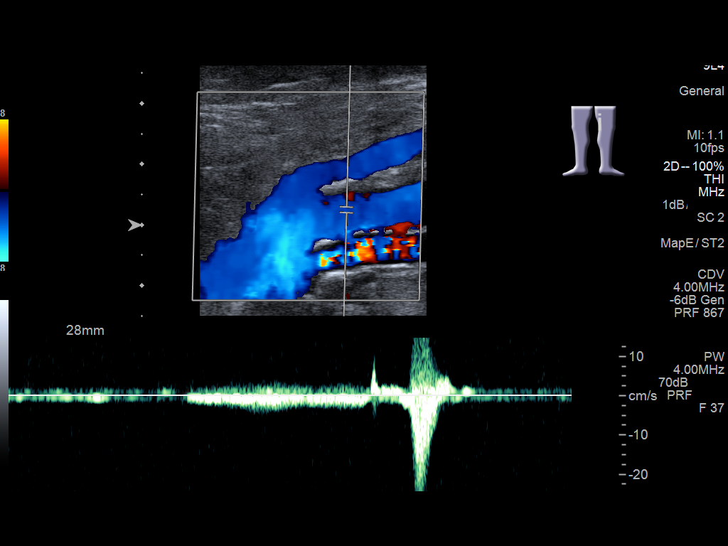
[im 33/42]
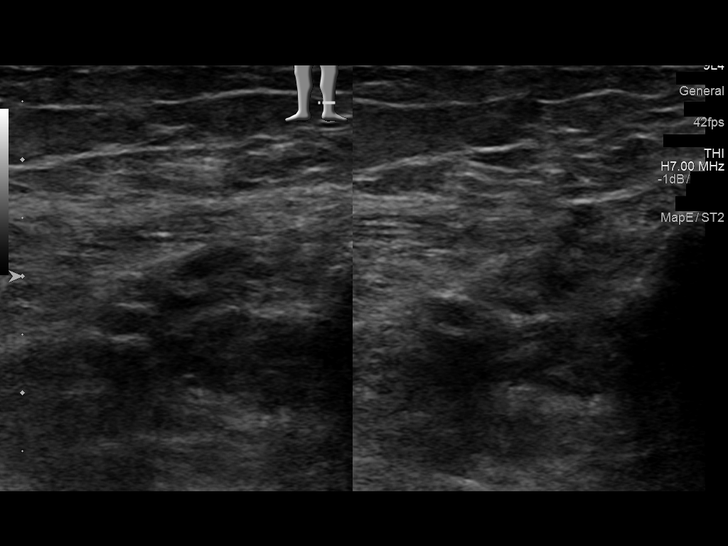
[im 34/42]
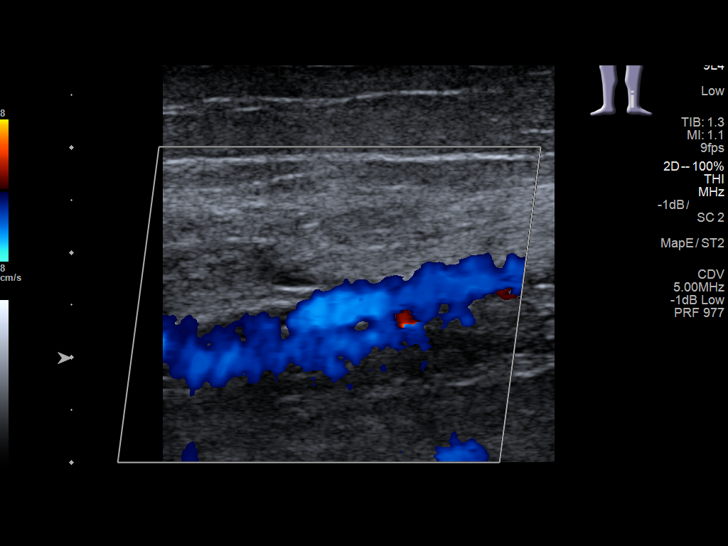
[im 38/42]
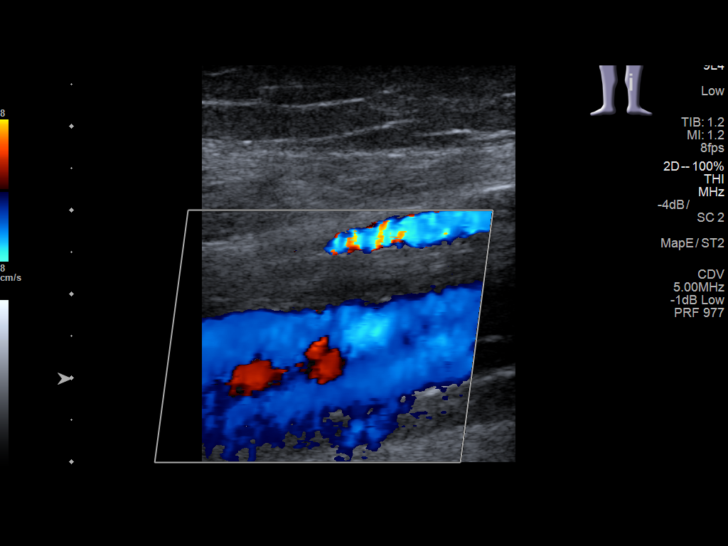
[im 42/42]
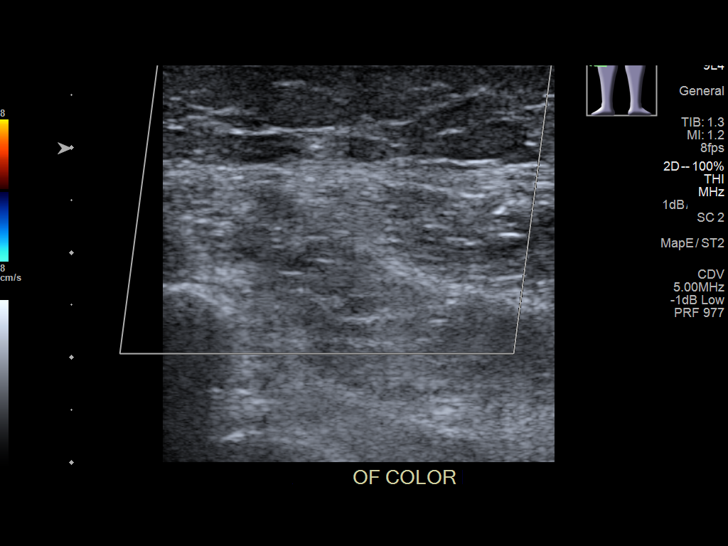

[14 of 24 positions shown; findings below may reference images not displayed]

FINDINGS: There is complete compressibility of the left common femoral,
femoral, and popliteal veins. Doppler analysis demonstrates
respiratory phasicity and augmentation of flow with calf
compression. No obvious superficial vein or calf vein thrombosis.
IMPRESSION: No evidence of left lower extremity DVT.

## 2016-10-13 ENCOUNTER — Ambulatory Visit (INDEPENDENT_AMBULATORY_CARE_PROVIDER_SITE_OTHER): Payer: Medicare Other | Admitting: Internal Medicine

## 2016-10-13 ENCOUNTER — Encounter: Payer: Self-pay | Admitting: Internal Medicine

## 2016-10-13 VITALS — BP 116/60 | HR 71 | Temp 97.5°F | Resp 17 | Ht 64.0 in | Wt 138.8 lb

## 2016-10-13 DIAGNOSIS — Z7901 Long term (current) use of anticoagulants: Secondary | ICD-10-CM | POA: Diagnosis not present

## 2016-10-13 DIAGNOSIS — E785 Hyperlipidemia, unspecified: Secondary | ICD-10-CM

## 2016-10-13 DIAGNOSIS — R5383 Other fatigue: Secondary | ICD-10-CM | POA: Diagnosis not present

## 2016-10-13 DIAGNOSIS — E559 Vitamin D deficiency, unspecified: Secondary | ICD-10-CM | POA: Diagnosis not present

## 2016-10-13 DIAGNOSIS — R7989 Other specified abnormal findings of blood chemistry: Secondary | ICD-10-CM | POA: Diagnosis not present

## 2016-10-13 DIAGNOSIS — E119 Type 2 diabetes mellitus without complications: Secondary | ICD-10-CM

## 2016-10-13 DIAGNOSIS — R42 Dizziness and giddiness: Secondary | ICD-10-CM | POA: Diagnosis not present

## 2016-10-13 DIAGNOSIS — I482 Chronic atrial fibrillation, unspecified: Secondary | ICD-10-CM

## 2016-10-13 DIAGNOSIS — I10 Essential (primary) hypertension: Secondary | ICD-10-CM | POA: Diagnosis not present

## 2016-10-13 DIAGNOSIS — E1169 Type 2 diabetes mellitus with other specified complication: Secondary | ICD-10-CM | POA: Diagnosis not present

## 2016-10-13 DIAGNOSIS — R5381 Other malaise: Secondary | ICD-10-CM

## 2016-10-13 DIAGNOSIS — E538 Deficiency of other specified B group vitamins: Secondary | ICD-10-CM

## 2016-10-13 DIAGNOSIS — F411 Generalized anxiety disorder: Secondary | ICD-10-CM

## 2016-10-13 DIAGNOSIS — K5909 Other constipation: Secondary | ICD-10-CM

## 2016-10-13 DIAGNOSIS — I495 Sick sinus syndrome: Secondary | ICD-10-CM

## 2016-10-13 LAB — CBC WITH DIFFERENTIAL/PLATELET
BASOS ABS: 0 10*3/uL (ref 0.0–0.1)
Basophils Relative: 0.7 % (ref 0.0–3.0)
Eosinophils Absolute: 0.2 10*3/uL (ref 0.0–0.7)
Eosinophils Relative: 4.4 % (ref 0.0–5.0)
HCT: 42.5 % (ref 36.0–46.0)
Hemoglobin: 14.4 g/dL (ref 12.0–15.0)
LYMPHS ABS: 1.5 10*3/uL (ref 0.7–4.0)
Lymphocytes Relative: 26.4 % (ref 12.0–46.0)
MCHC: 33.9 g/dL (ref 30.0–36.0)
MCV: 93.2 fl (ref 78.0–100.0)
MONO ABS: 0.5 10*3/uL (ref 0.1–1.0)
Monocytes Relative: 9.6 % (ref 3.0–12.0)
NEUTROS ABS: 3.3 10*3/uL (ref 1.4–7.7)
Neutrophils Relative %: 58.9 % (ref 43.0–77.0)
PLATELETS: 249 10*3/uL (ref 150.0–400.0)
RBC: 4.57 Mil/uL (ref 3.87–5.11)
RDW: 13.3 % (ref 11.5–15.5)
WBC: 5.6 10*3/uL (ref 4.0–10.5)

## 2016-10-13 LAB — TSH: TSH: 0.82 u[IU]/mL (ref 0.35–4.50)

## 2016-10-13 LAB — COMPREHENSIVE METABOLIC PANEL
ALK PHOS: 60 U/L (ref 39–117)
ALT: 16 U/L (ref 0–35)
AST: 21 U/L (ref 0–37)
Albumin: 4 g/dL (ref 3.5–5.2)
BUN: 10 mg/dL (ref 6–23)
CO2: 31 meq/L (ref 19–32)
Calcium: 9.7 mg/dL (ref 8.4–10.5)
Chloride: 95 mEq/L — ABNORMAL LOW (ref 96–112)
Creatinine, Ser: 0.74 mg/dL (ref 0.40–1.20)
GFR: 78.7 mL/min (ref >60.00)
GLUCOSE: 154 mg/dL — AB (ref 70–99)
POTASSIUM: 4.2 meq/L (ref 3.5–5.1)
SODIUM: 131 meq/L — AB (ref 135–145)
TOTAL PROTEIN: 7 g/dL (ref 6.0–8.3)
Total Bilirubin: 0.5 mg/dL (ref 0.2–1.2)

## 2016-10-13 LAB — VITAMIN B12: VITAMIN B 12: 485 pg/mL (ref 211–911)

## 2016-10-13 LAB — PROTIME-INR
INR: 3.3 ratio — AB (ref 0.8–1.0)
Prothrombin Time: 35.4 s — ABNORMAL HIGH (ref 9.6–13.1)

## 2016-10-13 LAB — HEMOGLOBIN A1C: Hgb A1c MFr Bld: 6.9 % — ABNORMAL HIGH (ref 4.6–6.5)

## 2016-10-13 LAB — VITAMIN D 25 HYDROXY (VIT D DEFICIENCY, FRACTURES): VITD: 30.01 ng/mL (ref 30.00–100.00)

## 2016-10-13 NOTE — Progress Notes (Signed)
Pre visit review using our clinic review tool, if applicable. No additional management support is needed unless otherwise documented below in the visit note. 

## 2016-10-13 NOTE — Patient Instructions (Addendum)
Ok to start taking allegra daily for your  allergies   Make sure you are drinkgin at least 36 ounces of water  Every day  You can continue propranolol and meclizine   I recommend physical therapy to help manage your dizziness

## 2016-10-13 NOTE — Progress Notes (Signed)
Subjective:  Patient ID: Leah Holmes, female    DOB: 1926/11/06  Age: 83 y.o. MRN: [id number]  CC: The primary encounter diagnosis was Diabetes mellitus without complication ([place]). Diagnoses of Hyperlipidemia associated with type 2 diabetes mellitus ([place]), Low serum vitamin D, Anticoagulation monitoring, INR range 2-3, Dizziness, Fatigue, unspecified type, Vitamin D deficiency, B12 deficiency, Essential hypertension, benign, Sick sinus syndrome ([place]), Chronic atrial fibrillation ([place]), Other constipation, and Generalized anxiety disorder were also pertinent to this visit.   HPI Leah Holmes presents for follow up on hypertension,  Type 2 diabetes,  Atrial fibrillation  with bradycardia s/p pacemaker,   Cc:  Using a cane,  Had a fall in the yard recently while walking  In the garden, tripped over a stone.  Occurred about a week ago .  No injuries  Chronic  dizziness getting worse,   Aggravated by motion  And more recently by pollen  Doing a lot of sneezing ,  Leah Holmes stopped the amlodipine but apaient restarted it due to elevated home readings  Taking hctz daily  Has neuropathy    Still won't consider taking lexapro for generalized anxiety and chronic malaise.   Taking inderal for tremors,   No longer hurting her stomach   Using meclizine for dizziness  Prn  1-2 / week   Not checking sugars much . glipizide was stopped last year.   Takes 4 mg coumadin  daily .  No recent bleeding or bruising.  Avoids eating salads and green vegetables because of coumadin.  Discussed need to have a healthy diet.   Outpatient Medications Prior to Visit  Medication Sig Dispense Refill  . amLODipine (NORVASC) 5 MG tablet TAKE ONE TABLET BY MOUTH ONCE DAILY 90 tablet 3  . aspirin EC 81 MG tablet Take 81 mg by mouth daily. In am.    . Cholecalciferol (VITAMIN D3) 1000 UNITS CAPS Take 1,000 Units by mouth daily. In am.    . docusate sodium (COLACE) 50 MG capsule Take 50-100 mg by mouth daily as needed  for mild constipation.     Marland Kitchen glucose blood (ONE TOUCH ULTRA TEST) test strip Check sugar bid Dx E11.9 100 each 5  . hydrochlorothiazide (HYDRODIURIL) 25 MG tablet Take 25 mg by mouth every morning.    . isosorbide mononitrate (IMDUR) 60 MG 24 hr tablet Take 60 mg by mouth 2 (two) times daily.    Marland Kitchen lisinopril (PRINIVIL,ZESTRIL) 20 MG tablet TAKE ONE TABLET BY MOUTH TWICE DAILY 60 tablet 5  . meclizine (ANTIVERT) 25 MG tablet TAKE ONE TABLET BY MOUTH THREE TIMES DAILY AS NEEDED 30 tablet 5  . nitroGLYCERIN (NITROSTAT) 0.4 MG SL tablet Place 0.4 mg under the tongue every 5 (five) minutes as needed for chest pain.    Marland Kitchen omeprazole (PRILOSEC) 20 MG capsule Take 20 mg by mouth daily.    Marland Kitchen omeprazole (PRILOSEC) 20 MG capsule TAKE ONE CAPSULE BY MOUTH ONCE DAILY 90 capsule 1  . ONETOUCH DELICA LANCETS 44Y MISC USE ONE LANCET TO CHECK BLOOD SUGAR TWICE DAILY  Dx Code E11.9 100 each 0  . psyllium (METAMUCIL) 58.6 % packet Take 1 packet by mouth daily as needed (for constipation).     . warfarin (COUMADIN) 2 MG tablet Take 4 mg by mouth daily at 6 PM.     . ergocalciferol (DRISDOL) 50000 units capsule Take 1 capsule (50,000 Units total) by mouth once a week. (Patient not taking: Reported on 10/13/2016) 12 capsule 0  . escitalopram (LEXAPRO) 10 MG  tablet Take 1 tablet (10 mg total) by mouth daily. (Patient not taking: Reported on 10/13/2016) 30 tablet 2   No facility-administered medications prior to visit.     Review of Systems;  Patient denies headache, fevers, malaise, unintentional weight loss, skin rash, eye pain, sinus congestion and sinus pain, sore throat, dysphagia,  hemoptysis , cough, dyspnea, wheezing, chest pain, palpitations, orthopnea, edema, abdominal pain, nausea, melena, diarrhea, constipation, flank pain, dysuria, hematuria, urinary  Frequency, nocturia, numbness, tingling, seizures,  Focal weakness, Loss of consciousness,  Tremor, insomnia, depression, anxiety, and suicidal ideation.       Objective:  BP 116/60 (BP Location: Left Arm, Patient Position: Sitting, Cuff Size: Normal)   Pulse 71   Temp 97.5 F (36.4 C) (Oral)   Resp 17   Ht 5\' 4"  (1.626 m)   Wt 138 lb 12.8 oz (63 kg)   SpO2 97%   BMI 23.82 kg/m   BP Readings from Last 3 Encounters:  10/13/16 116/60  03/09/16 110/64  01/29/16 (!) 141/72    Wt Readings from Last 3 Encounters:  10/13/16 138 lb 12.8 oz (63 kg)  03/09/16 141 lb 12 oz (64.3 kg)  01/28/16 140 lb 8 oz (63.7 kg)    General appearance: alert, cooperative and appears stated age Ears: normal TM's and external ear canals both ears Throat: lips, mucosa, and tongue normal; teeth and gums normal Neck: no adenopathy, no carotid bruit, supple, symmetrical, trachea midline and thyroid not enlarged, symmetric, no tenderness/mass/nodules Back: symmetric, no curvature. ROM normal. No CVA tenderness. Lungs: clear to auscultation bilaterally Heart: regular rate and rhythm, S1, S2 normal, no murmur, click, rub or gallop Abdomen: soft, non-tender; bowel sounds normal; no masses,  no organomegaly Pulses: 2+ and symmetric Skin: Skin color, texture, turgor normal. No rashes or lesions Lymph nodes: Cervical, supraclavicular, and axillary nodes normal.  Lab Results  Component Value Date   HGBA1C 6.9 (H) 10/13/2016   HGBA1C 6.8 (H) 03/09/2016   HGBA1C 6.6 (H) 10/30/2015    Lab Results  Component Value Date   CREATININE 0.74 10/13/2016   CREATININE 0.66 03/09/2016   CREATININE 0.63 01/21/2016    Lab Results  Component Value Date   WBC 5.6 10/13/2016   HGB 14.4 10/13/2016   HCT 42.5 10/13/2016   PLT 249.0 10/13/2016   GLUCOSE 154 (H) 10/13/2016   CHOL 204 (H) 10/30/2015   TRIG 94.0 10/30/2015   HDL 57.80 10/30/2015   LDLDIRECT 116.0 10/30/2015   LDLCALC 128 (H) 10/30/2015   ALT 16 10/13/2016   AST 21 10/13/2016   NA 131 (L) 10/13/2016   K 4.2 10/13/2016   CL 95 (L) 10/13/2016   CREATININE 0.74 10/13/2016   BUN 10 10/13/2016   CO2  31 10/13/2016   TSH 0.82 10/13/2016   INR 3.3 (H) 10/13/2016   HGBA1C 6.9 (H) 10/13/2016   MICROALBUR 1.0 10/30/2015    Dg Bone Density  Result Date: 03/18/2016 EXAM: DUAL X-RAY ABSORPTIOMETRY (DXA) FOR BONE MINERAL DENSITY IMPRESSION: Dear Dr Deborra Medina, Your patient Leah Holmes completed a BMD test on 03/18/2016 using the Elliott (analysis version: 14.10) manufactured by [employer]. The following summarizes the results of our evaluation. PATIENT BIOGRAPHICAL: Name: Bently, Morath Patient ID: [id number] Birth Date: 1927/03/10 Height: 64.0 in. Gender: Female Exam Date: 03/18/2016 Weight: 140.4 lbs. Indications: Advanced Age, Caucasian, Early Menopause, Height Loss, History of Fracture (Adult), Hysterectomy, POSTmenopausal Fractures: left wrist Treatments: 81 MG ASPIRIN, Vitamin D, WARFARIN ASSESSMENT: The BMD measured at  AP Spine L1-L4 is 0.818 g/cm2 with a T-score of -3.0. This patient is considered osteoporotic according to [facility]) criteria. Patient is not a candidate for FRAX assessment due to osteoporosis report. Site Region Measured Measured WHO Young Adult BMD Date       Age      Classification T-score AP Spine L1-L4 03/18/2016 87.5 Osteoporosis -3.0 0.818 g/cm2 DualFemur Neck Right 03/18/2016 87.5 Osteoporosis -2.9 0.637 g/cm2 World Health Organization [facility]) criteria for post-menopausal, Caucasian Women: Normal:       T-score at or above -1 SD Osteopenia:   T-score between -1 and -2.5 SD Osteoporosis: T-score at or below -2.5 SD RECOMMENDATIONS: [place] recommends that FDA-approved medical therapies be considered in postemenopausal women and men age 23 or older with a: 1. Hip or vertebral (clinical or morphometric) fracture. 2. T-score of < -2.5at the spine or hip. 3. Ten-year fracture probability by FRAX of 3% or greater for hip fracture or 20% or greater for major osteoporotic fracture. All treatment decisions require  clinical judgment and consideration of individual patient factors, including patient preferences, co-morbidities, previous drug use, risk factors not captured in the FRAX model (e.g. falls, vitamin D deficiency, increased bone turnover, interval significant decline in bone density) and possible under - or over-estimation of fracture risk by FRAX. All patients should ensure an adequate intake of dietary calcium (1200 mg/d) and vitamin D (800 IU daily) unless contraindicated. FOLLOW-UP: People with diagnosed cases of osteoporosis or at high risk for fracture should have regular bone mineral density tests. For patients eligible for Medicare, routine testing is allowed once every 2 years. The testing frequency can be increased to one year for patients who have rapidly progressing disease, those who are receiving or discontinuing medical therapy to restore bone mass, or have additional risk factors. I have reviewed this report, and agree with the above findings. [facility] Radiology Electronically Signed   By: Lowella Grip III M.D.   On: 03/18/2016 13:40    Assessment & Plan:   Problem List Items Addressed This Visit    Atrial fibrillation ([place])    Rate controlled,  Stroke risk mitigated with warfarin.   She had a recent minor fall with no injuries.        Relevant Medications   propranolol (INDERAL) 10 MG tablet   Constipation    suggested by exam and symptoms .Marland Kitchen  Cathartic formula prescribed, followed by daily regimen outlined. Lytes are normal.  Has not had thyroid function checked in over a year  Will check if recurrent.   Lab Results  Component Value Date   TSH 0.82 10/13/2016   Lab Results  Component Value Date   NA 131 (L) 10/13/2016   K 4.2 10/13/2016   CL 95 (L) 10/13/2016   CO2 31 10/13/2016         Diabetes mellitus without complication ([place]) - Primary    Recent elevations reported,  But A1c remains well controlled.  Will not resume glipizide  unless A1c is over 7.5  Lab  Results  Component Value Date   HGBA1C 6.9 (H) 10/13/2016         Relevant Orders   Hemoglobin A1c (Completed)   Essential hypertension, benign    No changes today given persistent dizziness.       Relevant Medications   propranolol (INDERAL) 10 MG tablet   Generalized anxiety disorder    She has refused to try lexapro or any other SSRI despite multiple recommendations  Hyperlipidemia associated with type 2 diabetes mellitus ([facility])   Relevant Medications   propranolol (INDERAL) 10 MG tablet   Low serum vitamin D   Sick sinus syndrome ([facility])    s/p pacemaker .  Managed by Leah Holmes      Relevant Medications   propranolol (INDERAL) 10 MG tablet    Other Visit Diagnoses    Anticoagulation monitoring, INR range 2-3       Relevant Orders   INR/PT (Completed)   Basic metabolic panel   Dizziness       Relevant Orders   CBC with Differential/Platelet (Completed)   Comprehensive metabolic panel (Completed)   Fatigue, unspecified type       Relevant Orders   TSH (Completed)   Vitamin D deficiency       Relevant Orders   VITAMIN D 25 Hydroxy (Vit-D Deficiency, Fractures) (Completed)   B12 deficiency       Relevant Orders   Vitamin B12 (Completed)      I have discontinued Leah Holmes's escitalopram and ergocalciferol. I am also having her maintain her nitroGLYCERIN, aspirin EC, warfarin, psyllium, ONETOUCH DELICA LANCETS 91T, glucose blood, Vitamin D3, meclizine, isosorbide mononitrate, omeprazole, hydrochlorothiazide, docusate sodium, omeprazole, amLODipine, lisinopril, and propranolol.  Meds ordered this encounter  Medications  . propranolol (INDERAL) 10 MG tablet    Medications Discontinued During This Encounter  Medication Reason  . escitalopram (LEXAPRO) 10 MG tablet Patient has not taken in last 30 days  . ergocalciferol (DRISDOL) 50000 units capsule Therapy completed    Follow-up: Return in about 6 months (around 04/15/2017) for follow up diabetes.   Crecencio Mc, MD

## 2016-10-14 ENCOUNTER — Encounter: Payer: Self-pay | Admitting: Internal Medicine

## 2016-10-14 NOTE — Assessment & Plan Note (Signed)
Recent elevations reported,  But A1c remains well controlled.  Will not resume glipizide  unless A1c is over 7.5  Lab Results  Component Value Date   HGBA1C 6.9 (H) 10/13/2016

## 2016-10-14 NOTE — Assessment & Plan Note (Signed)
suggested by exam and symptoms .Marland Kitchen  Cathartic formula prescribed, followed by daily regimen outlined. Lytes are normal.  Has not had thyroid function checked in over a year  Will check if recurrent.   Lab Results  Component Value Date   TSH 0.82 10/13/2016   Lab Results  Component Value Date   NA 131 (L) 10/13/2016   K 4.2 10/13/2016   CL 95 (L) 10/13/2016   CO2 31 10/13/2016

## 2016-10-14 NOTE — Assessment & Plan Note (Signed)
Rate controlled,  Stroke risk mitigated with warfarin.   She had a recent minor fall with no injuries.

## 2016-10-14 NOTE — Assessment & Plan Note (Signed)
Chronic.  Etiology unclear.  Will screen for thyroid, anemia,  hepatic and renal insufficiency,   Lab Results  Component Value Date   WBC 5.6 10/13/2016   HGB 14.4 10/13/2016   HCT 42.5 10/13/2016   MCV 93.2 10/13/2016   PLT 249.0 10/13/2016   Lab Results  Component Value Date   TSH 0.82 10/13/2016   Lab Results  Component Value Date   CREATININE 0.74 10/13/2016

## 2016-10-14 NOTE — Assessment & Plan Note (Signed)
She has refused to try lexapro or any other SSRI despite multiple recommendations 

## 2016-10-14 NOTE — Assessment & Plan Note (Signed)
s/p pacemaker .  Managed by [name]

## 2016-10-14 NOTE — Assessment & Plan Note (Signed)
No changes today given persistent dizziness.

## 2016-10-19 ENCOUNTER — Telehealth: Payer: Self-pay | Admitting: Internal Medicine

## 2016-10-22 ENCOUNTER — Other Ambulatory Visit: Payer: Self-pay

## 2016-10-22 MED ORDER — ONETOUCH DELICA LANCETS 33G MISC: 0 refills | Status: DC

## 2016-10-23 ENCOUNTER — Emergency Department: Payer: Medicare Other

## 2016-10-23 ENCOUNTER — Emergency Department
Admission: EM | Admit: 2016-10-23 | Discharge: 2016-10-23 | Disposition: A | Discharge status: Home / Self Care | Payer: Medicare Other | Attending: Emergency Medicine | Admitting: Emergency Medicine

## 2016-10-23 ENCOUNTER — Encounter: Payer: Self-pay | Admitting: *Deleted

## 2016-10-23 DIAGNOSIS — I10 Essential (primary) hypertension: Secondary | ICD-10-CM | POA: Diagnosis not present

## 2016-10-23 DIAGNOSIS — Z7901 Long term (current) use of anticoagulants: Secondary | ICD-10-CM | POA: Insufficient documentation

## 2016-10-23 DIAGNOSIS — Z87891 Personal history of nicotine dependence: Secondary | ICD-10-CM | POA: Diagnosis not present

## 2016-10-23 DIAGNOSIS — I509 Heart failure, unspecified: Secondary | ICD-10-CM | POA: Insufficient documentation

## 2016-10-23 DIAGNOSIS — I110 Hypertensive heart disease with heart failure: Secondary | ICD-10-CM | POA: Diagnosis not present

## 2016-10-23 DIAGNOSIS — I200 Unstable angina: Secondary | ICD-10-CM | POA: Diagnosis not present

## 2016-10-23 DIAGNOSIS — I25110 Atherosclerotic heart disease of native coronary artery with unstable angina pectoris: Secondary | ICD-10-CM | POA: Insufficient documentation

## 2016-10-23 DIAGNOSIS — Z7982 Long term (current) use of aspirin: Secondary | ICD-10-CM | POA: Insufficient documentation

## 2016-10-23 DIAGNOSIS — E119 Type 2 diabetes mellitus without complications: Secondary | ICD-10-CM | POA: Diagnosis not present

## 2016-10-23 DIAGNOSIS — R079 Chest pain, unspecified: Secondary | ICD-10-CM | POA: Diagnosis not present

## 2016-10-23 DIAGNOSIS — Z79899 Other long term (current) drug therapy: Secondary | ICD-10-CM | POA: Diagnosis not present

## 2016-10-23 DIAGNOSIS — R0789 Other chest pain: Secondary | ICD-10-CM | POA: Diagnosis present

## 2016-10-23 DIAGNOSIS — R0602 Shortness of breath: Secondary | ICD-10-CM | POA: Diagnosis not present

## 2016-10-23 LAB — BASIC METABOLIC PANEL
Anion gap: 7 (ref 5–15)
BUN: 10 mg/dL (ref 6–20)
CALCIUM: 9.1 mg/dL (ref 8.9–10.3)
CHLORIDE: 100 mmol/L — AB (ref 101–111)
CO2: 26 mmol/L (ref 22–32)
CREATININE: 0.65 mg/dL (ref 0.44–1.00)
GFR calc Af Amer: >60 mL/min (ref >60)
GFR calc non Af Amer: >60 mL/min (ref >60)
Glucose, Bld: 158 mg/dL — ABNORMAL HIGH (ref 65–99)
Potassium: 4.2 mmol/L (ref 3.5–5.1)
Sodium: 133 mmol/L — ABNORMAL LOW (ref 135–145)

## 2016-10-23 LAB — CBC
HCT: 41.4 % (ref 35.0–47.0)
Hemoglobin: 14.2 g/dL (ref 12.0–16.0)
MCH: 31.8 pg (ref 26.0–34.0)
MCHC: 34.3 g/dL (ref 32.0–36.0)
MCV: 92.8 fL (ref 80.0–100.0)
PLATELETS: 207 10*3/uL (ref 150–440)
RBC: 4.46 MIL/uL (ref 3.80–5.20)
RDW: 13.5 % (ref 11.5–14.5)
WBC: 5.1 10*3/uL (ref 3.6–11.0)

## 2016-10-23 LAB — TROPONIN I

## 2016-10-23 LAB — PROTIME-INR
INR: 2
Prothrombin Time: 23 seconds — ABNORMAL HIGH (ref 11.4–15.2)

## 2016-10-23 MED ORDER — RANOLAZINE ER 500 MG PO TB12: 500.0000 mg | ORAL_TABLET | Freq: Two times a day (BID) | ORAL | 2 refills | Status: DC

## 2016-10-23 MED ORDER — RANOLAZINE ER 500 MG PO TB12
500.0000 mg | ORAL_TABLET | Freq: Two times a day (BID) | ORAL | Status: DC
  Administered 2016-10-23: 500 mg via ORAL

## 2016-10-23 MED ORDER — RANOLAZINE ER 500 MG PO TB12
500.0000 mg | ORAL_TABLET | Freq: Two times a day (BID) | ORAL | Status: DC
  Filled 2016-10-23: qty 1

## 2016-10-23 NOTE — ED Triage Notes (Addendum)
States chest tightness mid chest that began this AM, states increased SOB, states pain worsens with deep breathes, states "I feel like I am being choked", awake and alert, hx of pacemaker, pt took 2 nitro before arrival, pt on coumadin with hx of afib

## 2016-10-23 NOTE — ED Provider Notes (Signed)
[facility] Emergency Department Provider Note       Time seen: ----------------------------------------- 5:00 PM on 10/23/2016 -----------------------------------------     I have reviewed the triage vital signs and the nursing notes.   HISTORY   Chief Complaint Chest Pain    HPI Leah Holmes is a 83 y.o. female who presents to the ED for chest tightness and mid chest pain that began this morning. She states it is more severe than normal. Reportedly in the distant past she had a cardiac catheterization that showed she has diffuse blockages that were to be managed medically. She does take long-acting nitrates. Recently she has held amlodipine but took it today. She also recently stopped taking HCTZ because it was dehydrating her. She has had some increased shortness of breath. The pain seemed to worsen when she breathes like she was being choked. She took 2 nitroglycerin prior to arrival which helped and she is on Coumadin chronically for A. fib   Past Medical History:  Diagnosis Date  . Atrial fibrillation ([place]) 2012  . CHF (congestive heart failure) ([place])   . Colon polyp 2003  . Coronary artery disease   . Diabetes mellitus without complication ([place])   . GERD (gastroesophageal reflux disease)   . Hyperlipidemia   . Hypertension   . Irritable bowel syndrome   . Polyp of colon   . PONV (postoperative nausea and vomiting)    with ether, not recently  . Ulcer     Patient Active Problem List   Diagnosis Date Noted  . Osteoporosis 03/21/2016  . Sick sinus syndrome ([place]) 01/28/2016  . Other malaise 11/01/2015  . Major depressive disorder, recurrent episode ([place]) 10/30/2015  . Constipation 07/27/2015  . Generalized anxiety disorder 04/02/2015  . Ischemic chest pain 01/08/2015  . Atrial fibrillation ([place]) 08/16/2014  . Medicare annual wellness visit, subsequent 05/23/2014  . Dry mouth 03/10/2014  . Low serum vitamin D 02/16/2014  . Dysphagia,  unspecified(787.20) 09/20/2013  . Anal polyp 09/20/2013  . Personal history of colonic polyps 08/31/2013  . CAD (coronary artery disease) 03/09/2013  . Diabetes mellitus without complication ([place]) 32/67/1245  . Hyperlipidemia associated with type 2 diabetes mellitus ([place]) 03/09/2013  . Essential hypertension, benign 03/09/2013  . Nonulcer dyspepsia 03/09/2013    Past Surgical History:  Procedure Laterality Date  . ABDOMINAL ADHESION SURGERY    . ABDOMINAL HYSTERECTOMY    . APPENDECTOMY    . BREAST SURGERY Right    biopsy   . CESAREAN SECTION     x 2  . CHOLECYSTECTOMY  03-2012  . COLONOSCOPY  2003   Dr Theodosia Paling in [place]  . fibroid tumor    . PACEMAKER INSERTION N/A 01/28/2016   Procedure: INSERTION PACEMAKER;  Surgeon: Isaias Cowman, MD;  Location: [facility] ORS;  Service: Cardiovascular;  Laterality: N/A;  . UPPER GI ENDOSCOPY  2003   Dr Theodosia Paling in [place]    Allergies Codeine; Penicillins; Atorvastatin; Clonidine derivatives; Gabapentin; Losartan; Morphine; Morphine and related; Reglan [metoclopramide]; and Hydralazine  Social History Social History  Substance Use Topics  . Smoking status: Never Smoker  . Smokeless tobacco: Former Systems developer    Types: Snuff     Comment: occasionally  . Alcohol use No    Review of Systems Constitutional: Negative for fever. Eyes: Negative for vision changes ENT:  Negative for congestion, sore throat Cardiovascular: Positive for chest pain Respiratory: Positive for shortness of breath Gastrointestinal: Negative for abdominal pain, vomiting and diarrhea. Genitourinary: Negative for dysuria. Musculoskeletal: Negative  for back pain. Skin: Negative for rash. Neurological: Negative for headaches, focal weakness or numbness.  All systems negative/normal/unremarkable except as stated in the HPI  ____________________________________________   PHYSICAL EXAM:  VITAL SIGNS: ED Triage Vitals  Enc Vitals Group     BP 10/23/16 1425 (!)  164/83     Pulse Rate 10/23/16 1425 100     Resp 10/23/16 1425 16     Temp 10/23/16 1425 98.4 F (36.9 C)     Temp Source 10/23/16 1425 Oral     SpO2 10/23/16 1425 96 %     Weight 10/23/16 1420 138 lb (62.6 kg)     Height 10/23/16 1420 5\' 4"  (1.626 m)     Head Circumference --      Peak Flow --      Pain Score 10/23/16 1420 3     Pain Loc --      Pain Edu? --      Excl. in [place]? --     Constitutional: Alert and oriented. Well appearing and in no distress. Eyes: Conjunctivae are normal. PERRL. Normal extraocular movements. ENT   Head: Normocephalic and atraumatic.   Nose: No congestion/rhinnorhea.   Mouth/Throat: Mucous membranes are moist.   Neck: No stridor. Cardiovascular: Irregularly irregular rhythm. No murmurs, rubs, or gallops. Respiratory: Normal respiratory effort without tachypnea nor retractions. Breath sounds are clear and equal bilaterally. No wheezes/rales/rhonchi. Gastrointestinal: Soft and nontender. Normal bowel sounds Musculoskeletal: Nontender with normal range of motion in extremities. No lower extremity tenderness nor edema. Neurologic:  Normal speech and language. No gross focal neurologic deficits are appreciated.  Skin:  Skin is warm, dry and intact. No rash noted. Psychiatric: Mood and affect are normal. Speech and behavior are normal.  ____________________________________________  EKG: Interpreted by me. Fibrillation with PVCs, left axis deviation, left bundle branch block.  ____________________________________________  ED COURSE:  Pertinent labs & imaging results that were available during my care of the patient were reviewed by me and considered in my medical decision making (see chart for details). Patient presents for chest pain, we will assess with labs and imaging as indicated.   Procedures ____________________________________________   LABS (pertinent positives/negatives)  Labs Reviewed  BASIC METABOLIC PANEL - Abnormal;  Notable for the following:       Result Value   Sodium 133 (*)    Chloride 100 (*)    Glucose, Bld 158 (*)    All other components within normal limits  CBC  TROPONIN I    RADIOLOGY  Chest x-ray is unremarkable  ____________________________________________  FINAL ASSESSMENT AND PLAN  Chest pain  Plan: Patient's labs and imaging were dictated above. Patient had presented for chest pain of uncertain etiology. She has a complicated cardiac history. We will assess with labs and discuss with cardiology.   Earleen Newport, MD   Note: This note was generated in part or whole with voice recognition software. Voice recognition is usually quite accurate but there are transcription errors that can and very often do occur. I apologize for any typographical errors that were not detected and corrected.     Earleen Newport, MD 10/23/16 [phone]

## 2016-10-27 DIAGNOSIS — Z7901 Long term (current) use of anticoagulants: Secondary | ICD-10-CM | POA: Diagnosis not present

## 2016-10-27 DIAGNOSIS — Z5181 Encounter for therapeutic drug level monitoring: Secondary | ICD-10-CM | POA: Diagnosis not present

## 2016-10-29 ENCOUNTER — Ambulatory Visit: Payer: Medicare Other

## 2016-10-29 MED ORDER — GLUCOSE BLOOD VI STRP: ORAL_STRIP | 5 refills | Status: DC

## 2016-10-29 NOTE — Telephone Encounter (Signed)
addded a dx code and re sent to pharmacy.

## 2016-10-29 NOTE — Telephone Encounter (Signed)
Pharmacy called and stated that they need a dx on the test strips and to resend it. Please advise, thank you!

## 2016-10-29 NOTE — Addendum Note (Signed)
Addended by: Adair Laundry on: 10/29/2016 09:59 AM   Modules accepted: Orders

## 2016-11-03 DIAGNOSIS — I25118 Atherosclerotic heart disease of native coronary artery with other forms of angina pectoris: Secondary | ICD-10-CM | POA: Diagnosis not present

## 2016-11-03 DIAGNOSIS — I071 Rheumatic tricuspid insufficiency: Secondary | ICD-10-CM | POA: Diagnosis not present

## 2016-11-03 DIAGNOSIS — I10 Essential (primary) hypertension: Secondary | ICD-10-CM | POA: Diagnosis not present

## 2016-11-03 DIAGNOSIS — I482 Chronic atrial fibrillation: Secondary | ICD-10-CM | POA: Diagnosis not present

## 2016-11-24 DIAGNOSIS — I495 Sick sinus syndrome: Secondary | ICD-10-CM | POA: Diagnosis not present

## 2016-11-24 DIAGNOSIS — I482 Chronic atrial fibrillation: Secondary | ICD-10-CM | POA: Diagnosis not present

## 2016-12-09 ENCOUNTER — Telehealth: Payer: Self-pay | Admitting: Internal Medicine

## 2016-12-09 MED ORDER — GLIPIZIDE 5 MG PO TABS: 2.5000 mg | ORAL_TABLET | Freq: Every day | ORAL | 2 refills | Status: DC

## 2016-12-09 NOTE — Telephone Encounter (Signed)
Yes, that's exactly what I said.Marland KitchenMarland Kitchen

## 2016-12-09 NOTE — Telephone Encounter (Signed)
Lab Results  Component Value Date   HGBA1C 6.9 (H) 10/13/2016   Reviewed her blood sugars recommned taking 2.5 mg glipizide with dinner only.  No morning dose.  This does not come in 2.5 mg tablets so the 5 mg tablet will have to be cut in half daily

## 2016-12-09 NOTE — Telephone Encounter (Signed)
Spoke with pt and she stated that she has not been taking any glipizide. She stated that she was taken off of it at her last appt. So she wanted to make sure that she is to be taking the 2.5mg  of glipizide daily with dinner only.

## 2016-12-10 NOTE — Telephone Encounter (Signed)
Left message for patient to return call back.  

## 2016-12-10 NOTE — Telephone Encounter (Signed)
Patients daughter has been informed.

## 2016-12-17 ENCOUNTER — Encounter: Payer: Self-pay | Admitting: *Deleted

## 2016-12-17 ENCOUNTER — Telehealth: Payer: Self-pay | Admitting: Internal Medicine

## 2016-12-17 ENCOUNTER — Ambulatory Visit
Admission: EM | Admit: 2016-12-17 | Discharge: 2016-12-17 | Disposition: A | Discharge status: Home / Self Care | Payer: Medicare Other | Attending: Family Medicine | Admitting: Family Medicine

## 2016-12-17 ENCOUNTER — Ambulatory Visit: Payer: Medicare Other

## 2016-12-17 DIAGNOSIS — M940 Chondrocostal junction syndrome [Tietze]: Secondary | ICD-10-CM

## 2016-12-17 DIAGNOSIS — M549 Dorsalgia, unspecified: Secondary | ICD-10-CM | POA: Diagnosis not present

## 2016-12-17 DIAGNOSIS — R109 Unspecified abdominal pain: Secondary | ICD-10-CM

## 2016-12-17 DIAGNOSIS — I700 Atherosclerosis of aorta: Secondary | ICD-10-CM | POA: Diagnosis not present

## 2016-12-17 DIAGNOSIS — M5134 Other intervertebral disc degeneration, thoracic region: Secondary | ICD-10-CM | POA: Insufficient documentation

## 2016-12-17 DIAGNOSIS — J449 Chronic obstructive pulmonary disease, unspecified: Secondary | ICD-10-CM | POA: Insufficient documentation

## 2016-12-17 LAB — URINALYSIS, COMPLETE (UACMP) WITH MICROSCOPIC
Bilirubin Urine: NEGATIVE
Glucose, UA: NEGATIVE mg/dL
Ketones, ur: NEGATIVE mg/dL
Leukocytes, UA: NEGATIVE
Nitrite: NEGATIVE
PROTEIN: NEGATIVE mg/dL
SPECIFIC GRAVITY, URINE: 1.025 (ref 1.005–1.030)
pH: 5.5 (ref 5.0–8.0)

## 2016-12-17 NOTE — ED Triage Notes (Signed)
Gradual onset right flank and low back pain over past 2 days. Much worse today. Denies other symptoms.

## 2016-12-17 NOTE — ED Provider Notes (Signed)
[facility]    CSN: [id number] Arrival date & time: 12/17/16  1513     History   Chief Complaint Chief Complaint  Patient presents with  . Flank Pain  . Back Pain    HPI Leah Holmes is a 83 y.o. female.   83 yo female with a c/o right sided back and flank pain radiating towards the right chest for the past day. States pain is worse today. Denies any urinary symptoms, shortness of breath, injury/trauma, numbness/tingling, rash.    The history is provided by the patient.    Past Medical History:  Diagnosis Date  . Atrial fibrillation ([place]) 2012  . CHF (congestive heart failure) ([place])   . Colon polyp 2003  . Coronary artery disease   . Diabetes mellitus without complication ([place])   . GERD (gastroesophageal reflux disease)   . Hyperlipidemia   . Hypertension   . Irritable bowel syndrome   . Polyp of colon   . PONV (postoperative nausea and vomiting)    with ether, not recently  . Ulcer     Patient Active Problem List   Diagnosis Date Noted  . Osteoporosis 03/21/2016  . Sick sinus syndrome ([place]) 01/28/2016  . Other malaise 11/01/2015  . Major depressive disorder, recurrent episode ([place]) 10/30/2015  . Constipation 07/27/2015  . Generalized anxiety disorder 04/02/2015  . Ischemic chest pain 01/08/2015  . Atrial fibrillation ([place]) 08/16/2014  . Medicare annual wellness visit, subsequent 05/23/2014  . Dry mouth 03/10/2014  . Low serum vitamin D 02/16/2014  . Dysphagia, unspecified(787.20) 09/20/2013  . Anal polyp 09/20/2013  . Personal history of colonic polyps 08/31/2013  . CAD (coronary artery disease) 03/09/2013  . Diabetes mellitus without complication ([place]) 81/19/1478  . Hyperlipidemia associated with type 2 diabetes mellitus ([place]) 03/09/2013  . Essential hypertension, benign 03/09/2013  . Nonulcer dyspepsia 03/09/2013    Past Surgical History:  Procedure Laterality Date  . ABDOMINAL ADHESION SURGERY    . ABDOMINAL HYSTERECTOMY    .  APPENDECTOMY    . BREAST SURGERY Right    biopsy   . CESAREAN SECTION     x 2  . CHOLECYSTECTOMY  03-2012  . COLONOSCOPY  2003   Dr Theodosia Paling in [place]  . fibroid tumor    . PACEMAKER INSERTION N/A 01/28/2016   Procedure: INSERTION PACEMAKER;  Surgeon: Isaias Cowman, MD;  Location: [facility] ORS;  Service: Cardiovascular;  Laterality: N/A;  . UPPER GI ENDOSCOPY  2003   Dr Theodosia Paling in [place]    OB History    Gravida Para Term Preterm AB Living   2 2           SAB TAB Ectopic Multiple Live Births                  Obstetric Comments   1st Menstrual Cycle:  16 1st Pregnancy:  30       Home Medications    Prior to Admission medications   Medication Sig Start Date End Date Taking? Authorizing Provider  amLODipine (NORVASC) 5 MG tablet TAKE ONE TABLET BY MOUTH ONCE DAILY 09/23/16  Yes Crecencio Mc, MD  aspirin EC 81 MG tablet Take 81 mg by mouth daily. In am.   Yes [provider]  Cholecalciferol (VITAMIN D3) 1000 UNITS CAPS Take 1,000 Units by mouth daily. In am.   Yes [provider]  docusate sodium (COLACE) 50 MG capsule Take 50-100 mg by mouth daily as needed for mild constipation.  Yes [provider]  glipiZIDE (GLUCOTROL) 5 MG tablet Take 0.5 tablets (2.5 mg total) by mouth daily before supper. 12/09/16  Yes Crecencio Mc, MD  glucose blood (ONE TOUCH ULTRA TEST) test strip USE ONE STRIP TO CHECK GLUCOSE TWICE DAILY 10/29/16  Yes Crecencio Mc, MD  hydrochlorothiazide (HYDRODIURIL) 25 MG tablet Take 25 mg by mouth every morning. 12/12/15  Yes [provider]  isosorbide mononitrate (IMDUR) 60 MG 24 hr tablet Take 60 mg by mouth 2 (two) times daily. 12/27/15  Yes [provider]  lisinopril (PRINIVIL,ZESTRIL) 20 MG tablet TAKE ONE TABLET BY MOUTH TWICE DAILY 09/23/16  Yes Crecencio Mc, MD  meclizine (ANTIVERT) 25 MG tablet TAKE ONE TABLET BY MOUTH THREE TIMES DAILY AS NEEDED 12/12/15  Yes Crecencio Mc, MD  nitroGLYCERIN  (NITROSTAT) 0.4 MG SL tablet Place 0.4 mg under the tongue every 5 (five) minutes as needed for chest pain.   Yes [provider]  omeprazole (PRILOSEC) 20 MG capsule Take 20 mg by mouth daily. 07/16/13  Yes [provider]  omeprazole (PRILOSEC) 20 MG capsule TAKE ONE CAPSULE BY MOUTH ONCE DAILY 03/23/16  Yes Crecencio Mc, MD  [facility] DELICA LANCETS 74Q MISC USE ONE  TO CHECK GLUCOSE TWICE DAILY 10/22/16  Yes Crecencio Mc, MD  propranolol (INDERAL) 10 MG tablet  09/10/16  Yes [provider]  warfarin (COUMADIN) 2 MG tablet Take 4 mg by mouth daily at 6 PM.    Yes [provider]  psyllium (METAMUCIL) 58.6 % packet Take 1 packet by mouth daily as needed (for constipation).     [provider]  ranolazine (RANEXA) 500 MG 12 hr tablet Take 1 tablet (500 mg total) by mouth 2 (two) times daily. 10/23/16   Earleen [place], MD    Family History Family History  Problem Relation Age of Onset  . Cancer Sister 68       breast    Social History Social History  Substance Use Topics  . Smoking status: Never Smoker  . Smokeless tobacco: Former Systems developer    Types: Snuff     Comment: occasionally  . Alcohol use No     Allergies   Codeine; Penicillins; Atorvastatin; Clonidine derivatives; Gabapentin; Losartan; Morphine; Morphine and related; Reglan [metoclopramide]; and Hydralazine   Review of Systems Review of Systems   Physical Exam Triage Vital Signs ED Triage Vitals  Enc Vitals Group     BP 12/17/16 1527 (!) 154/88     Pulse Rate 12/17/16 1527 64     Resp 12/17/16 1527 16     Temp 12/17/16 1527 98.1 F (36.7 C)     Temp Source 12/17/16 1527 Oral     SpO2 12/17/16 1527 99 %     Weight 12/17/16 1535 137 lb (62.1 kg)     Height 12/17/16 1535 5\' 6"  (1.676 m)     Head Circumference --      Peak Flow --      Pain Score 12/17/16 1536 9     Pain Loc --      Pain Edu? --      Excl. in [place]? --    No data found.   Updated Vital  Signs BP (!) 154/88 (BP Location: Left Arm)   Pulse 64   Temp 98.1 F (36.7 C) (Oral)   Resp 16   Ht 5\' 6"  (1.676 m)   Wt 137 lb (62.1 kg)   SpO2 99%  BMI 22.11 kg/m   Visual Acuity Right Eye Distance:   Left Eye Distance:   Bilateral Distance:    Right Eye Near:   Left Eye Near:    Bilateral Near:     Physical Exam  Constitutional: She appears well-developed and well-nourished. No distress.  HENT:  Head: Normocephalic and atraumatic.  Right Ear: Tympanic membrane, external ear and ear canal normal.  Left Ear: Tympanic membrane, external ear and ear canal normal.  Nose: No mucosal edema, rhinorrhea, nose lacerations, sinus tenderness, nasal deformity, septal deviation or nasal septal hematoma. No epistaxis.  No foreign bodies. Right sinus exhibits no maxillary sinus tenderness and no frontal sinus tenderness. Left sinus exhibits no maxillary sinus tenderness and no frontal sinus tenderness.  Mouth/Throat: Uvula is midline, oropharynx is clear and moist and mucous membranes are normal. No oropharyngeal exudate.  Eyes: Conjunctivae and EOM are normal. Pupils are equal, round, and reactive to light. Right eye exhibits no discharge. Left eye exhibits no discharge. No scleral icterus.  Neck: Normal range of motion. Neck supple. No thyromegaly present.  Cardiovascular: Normal rate, regular rhythm and normal heart sounds.   Pulmonary/Chest: Effort normal and breath sounds normal. No respiratory distress. She has no wheezes. She has no rales. She exhibits tenderness.  Abdominal: Soft. Bowel sounds are normal. She exhibits no distension and no mass. There is no tenderness. There is no rebound and no guarding.  Lymphadenopathy:    She has no cervical adenopathy.  Skin: No rash noted. She is not diaphoretic.  Nursing note and vitals reviewed.    UC Treatments / Results  Labs (all labs ordered are listed, but only abnormal results are displayed) Labs Reviewed  URINALYSIS, COMPLETE  (UACMP) WITH MICROSCOPIC - Abnormal; Notable for the following:       Result Value   Hgb urine dipstick SMALL (*)    Squamous Epithelial / LPF 0-5 (*)    Bacteria, UA FEW (*)    All other components within normal limits  URINE CULTURE    EKG  EKG Interpretation None       Radiology No results found.  Procedures Procedures (including critical care time)  Medications Ordered in UC Medications - No data to display   Initial Impression / Assessment and Plan / UC Course  I have reviewed the triage vital signs and the nursing notes.  Pertinent labs & imaging results that were available during my care of the patient were reviewed by me and considered in my medical decision making (see chart for details).       Final Clinical Impressions(s) / UC Diagnoses   Final diagnoses:  Right flank pain  Costochondritis    New Prescriptions New Prescriptions   No medications on file   1. Labs/x-ray results and diagnosis reviewed with patient and daughter 2. Recommend supportive treatment with rest, ice/heat, otc acetaminophen prn  3. Follow-up prn if symptoms worsen or don't improve   Norval Gable, MD 12/17/16 [phone]

## 2016-12-17 NOTE — Telephone Encounter (Signed)
PT states she started 2 days ago with back pain. Today seems a little better. Would like to see [place]. Please call [phone].

## 2016-12-17 NOTE — Telephone Encounter (Signed)
Spoke to patient,  She stated that her back pain is easing up today but for the past two days middle of her back has been hurting.  Due to not having appointments when she would like to be seen, patient will go to urgent care in [place].

## 2016-12-18 ENCOUNTER — Other Ambulatory Visit: Payer: Self-pay | Admitting: Internal Medicine

## 2016-12-18 MED ORDER — LIDOCAINE 5 % EX PTCH: 1.0000 | MEDICATED_PATCH | CUTANEOUS | 0 refills | Status: DC

## 2016-12-18 NOTE — Telephone Encounter (Signed)
Extra strength Tylenol.  Patient is willing to try the Lidocaine patches but not tramadol.  Pharmacy [facility] in [place].  Please advise.

## 2016-12-18 NOTE — Telephone Encounter (Signed)
What is she taking now?

## 2016-12-18 NOTE — Telephone Encounter (Signed)
Please advise 

## 2016-12-18 NOTE — Telephone Encounter (Signed)
Lidocaine patches sent..   Remind her that she can take up to 2000 mg of tylenol on  DAILY BASIS in divided doses  .  This is perfectly safe.

## 2016-12-18 NOTE — Telephone Encounter (Signed)
Pt daughter [name] called and stated that they went the UC yesterday and they did xrays but didn't show anything. Doctor suggested that it might be muscular or early stages of shingles. Daughter is concerned because she is still in quiet a bit of pain. Please advise, thank you!  Call [facility] @ [phone]

## 2016-12-19 ENCOUNTER — Other Ambulatory Visit: Payer: Self-pay | Admitting: Internal Medicine

## 2016-12-19 LAB — URINE CULTURE

## 2016-12-19 MED ORDER — ACYCLOVIR 200 MG PO CAPS: 200.0000 mg | ORAL_CAPSULE | Freq: Every day | ORAL | 0 refills | Status: DC

## 2016-12-19 MED ORDER — GABAPENTIN 100 MG PO CAPS: 100.0000 mg | ORAL_CAPSULE | Freq: Three times a day (TID) | ORAL | 3 refills | Status: DC

## 2016-12-23 DIAGNOSIS — I482 Chronic atrial fibrillation: Secondary | ICD-10-CM | POA: Diagnosis not present

## 2017-01-15 ENCOUNTER — Other Ambulatory Visit: Payer: Self-pay | Admitting: Internal Medicine

## 2017-01-19 ENCOUNTER — Telehealth: Payer: Self-pay | Admitting: *Deleted

## 2017-01-19 ENCOUNTER — Telehealth: Payer: Self-pay | Admitting: Internal Medicine

## 2017-01-19 ENCOUNTER — Ambulatory Visit (INDEPENDENT_AMBULATORY_CARE_PROVIDER_SITE_OTHER): Payer: Medicare Other | Admitting: *Deleted

## 2017-01-19 DIAGNOSIS — R300 Dysuria: Secondary | ICD-10-CM

## 2017-01-19 LAB — POCT URINALYSIS DIPSTICK
BILIRUBIN UA: NEGATIVE
Glucose, UA: NEGATIVE
KETONES UA: NEGATIVE
Nitrite, UA: POSITIVE
PH UA: 6 (ref 5.0–8.0)
PROTEIN UA: 30
SPEC GRAV UA: 1.01 (ref 1.010–1.025)
Urobilinogen, UA: 0.2 E.U./dL

## 2017-01-19 MED ORDER — PHENAZOPYRIDINE HCL 200 MG PO TABS: 200.0000 mg | ORAL_TABLET | Freq: Three times a day (TID) | ORAL | 0 refills | Status: DC | PRN

## 2017-01-19 NOTE — Telephone Encounter (Signed)
FINE. SET HER UP FOR A NURSE VISIT TO DO SO.

## 2017-01-19 NOTE — Progress Notes (Signed)
Barbie Banner,

## 2017-01-19 NOTE — Telephone Encounter (Signed)
Patient called stating that when she woke up this morning she was having burning with urination and frequency. Patient says there is no visible blood in urine and no foul odor or cloudiness. Denies fever, chills. Would like to come in to be tested for UTI.

## 2017-01-19 NOTE — Progress Notes (Addendum)
Patient stated she started burning with urination last night has become more frequent today and now having some incontinence.Patient ak if she could get medication for the burning?  Pyridium sent to [facility].  Her last Urine collection in July was negatife for UTI,  So will wait for culture.   Regards,   Deborra Medina, MD

## 2017-01-19 NOTE — Addendum Note (Signed)
Addended by: Crecencio Mc on: 01/19/2017 05:21 PM   Modules accepted: Orders

## 2017-01-19 NOTE — Telephone Encounter (Signed)
Pt would like to come in to be tested for a UTi  Pt contact [phone]

## 2017-01-19 NOTE — Telephone Encounter (Signed)
Patient scheduled to come in this afternoon for nurse visit.

## 2017-01-19 NOTE — Telephone Encounter (Signed)
FYI.  Pts daughter [name]) called.  Nurse reported pt was seen today for concerns regarding uti.  Was given pyridium and was instructed would hold on abx - waiting for culture.  Was noticing some increased frequency and pink urine.  No fever.  No chills.  No nausea or vomiting.  Instructed nurse to notify, will hold on abx.  Monitor for any change.  Evaluate if worsening symptoms.   (2030) - called to check on pt.  Reviewed your note.  Spoke with her daughter.  Pt still with increased urinary frequency. Eating and drinking.  No nausea or vomiting.  Reported had problems with multiple abx in the past.  Has a history of GI issues with multiple abx.  Since no change from today, will hold on abx.  Having increased urinary frequency and pink urine previously.  Await culture results.  If any change, call or be evaluated.

## 2017-01-20 ENCOUNTER — Encounter: Payer: Self-pay | Admitting: Internal Medicine

## 2017-01-20 ENCOUNTER — Other Ambulatory Visit: Payer: Self-pay | Admitting: Internal Medicine

## 2017-01-20 DIAGNOSIS — I482 Chronic atrial fibrillation: Secondary | ICD-10-CM | POA: Diagnosis not present

## 2017-01-20 LAB — URINALYSIS, MICROSCOPIC ONLY

## 2017-01-20 MED ORDER — CIPROFLOXACIN HCL 250 MG PO TABS
250.0000 mg | ORAL_TABLET | Freq: Two times a day (BID) | ORAL | 0 refills | Status: DC
Start: 2017-01-20 — End: 2017-04-14

## 2017-01-22 LAB — URINE CULTURE

## 2017-01-22 NOTE — Telephone Encounter (Signed)
Pt daughter called to get culture results for UTI. Please advise?  Call daughter @ [phone]. Thank you!

## 2017-01-23 ENCOUNTER — Encounter: Payer: Self-pay | Admitting: Internal Medicine

## 2017-01-26 ENCOUNTER — Telehealth: Payer: Self-pay | Admitting: Internal Medicine

## 2017-01-26 NOTE — Telephone Encounter (Signed)
Left pt message asking to call Allison back directly at [phone] to schedule AWV. Thanks! °  °*NOTE* Last AWV 10/30/15 °

## 2017-01-28 ENCOUNTER — Other Ambulatory Visit: Payer: Self-pay | Admitting: Internal Medicine

## 2017-01-29 ENCOUNTER — Telehealth: Payer: Self-pay | Admitting: Internal Medicine

## 2017-01-29 MED ORDER — ONETOUCH DELICA LANCETS 33G MISC: 0 refills | Status: DC

## 2017-01-29 MED ORDER — GLUCOSE BLOOD VI STRP: ORAL_STRIP | 3 refills | Status: DC

## 2017-01-29 NOTE — Telephone Encounter (Signed)
Resent with dx code and dx.

## 2017-01-29 NOTE — Telephone Encounter (Signed)
Pharmacy called and stated that they needs diabetic supplies resent with dx code and icd 10. Please advise, thank you!

## 2017-02-03 DIAGNOSIS — Z7901 Long term (current) use of anticoagulants: Secondary | ICD-10-CM | POA: Diagnosis not present

## 2017-02-03 DIAGNOSIS — Z5181 Encounter for therapeutic drug level monitoring: Secondary | ICD-10-CM | POA: Diagnosis not present

## 2017-03-03 DIAGNOSIS — I517 Cardiomegaly: Secondary | ICD-10-CM | POA: Diagnosis not present

## 2017-03-03 DIAGNOSIS — H2511 Age-related nuclear cataract, right eye: Secondary | ICD-10-CM | POA: Diagnosis not present

## 2017-03-03 DIAGNOSIS — I10 Essential (primary) hypertension: Secondary | ICD-10-CM | POA: Diagnosis not present

## 2017-03-03 DIAGNOSIS — I25118 Atherosclerotic heart disease of native coronary artery with other forms of angina pectoris: Secondary | ICD-10-CM | POA: Diagnosis not present

## 2017-03-03 DIAGNOSIS — R2681 Unsteadiness on feet: Secondary | ICD-10-CM | POA: Diagnosis not present

## 2017-03-03 DIAGNOSIS — I482 Chronic atrial fibrillation: Secondary | ICD-10-CM | POA: Diagnosis not present

## 2017-03-03 LAB — HM DIABETES EYE EXAM

## 2017-03-03 NOTE — Telephone Encounter (Signed)
Left pt message asking to call Ebony Hail back directly at [phone] to schedule AWV. Thanks!  *NOTE* Last AWV 10/30/15

## 2017-03-12 ENCOUNTER — Encounter: Payer: Self-pay | Admitting: Internal Medicine

## 2017-03-24 ENCOUNTER — Other Ambulatory Visit: Payer: Self-pay | Admitting: Internal Medicine

## 2017-04-01 DIAGNOSIS — I482 Chronic atrial fibrillation: Secondary | ICD-10-CM | POA: Diagnosis not present

## 2017-04-14 ENCOUNTER — Encounter: Payer: Self-pay | Admitting: Internal Medicine

## 2017-04-14 ENCOUNTER — Ambulatory Visit (INDEPENDENT_AMBULATORY_CARE_PROVIDER_SITE_OTHER): Payer: Medicare Other | Admitting: Internal Medicine

## 2017-04-14 VITALS — BP 114/58 | HR 78 | Temp 98.0°F | Resp 15 | Ht 66.0 in | Wt 136.6 lb

## 2017-04-14 DIAGNOSIS — E119 Type 2 diabetes mellitus without complications: Secondary | ICD-10-CM

## 2017-04-14 DIAGNOSIS — G252 Other specified forms of tremor: Secondary | ICD-10-CM | POA: Diagnosis not present

## 2017-04-14 DIAGNOSIS — I10 Essential (primary) hypertension: Secondary | ICD-10-CM

## 2017-04-14 DIAGNOSIS — I200 Unstable angina: Secondary | ICD-10-CM | POA: Diagnosis not present

## 2017-04-14 LAB — COMPREHENSIVE METABOLIC PANEL
ALBUMIN: 3.8 g/dL (ref 3.5–5.2)
ALK PHOS: 61 U/L (ref 39–117)
ALT: 17 U/L (ref 0–35)
AST: 20 U/L (ref 0–37)
BUN: 12 mg/dL (ref 6–23)
CHLORIDE: 99 meq/L (ref 96–112)
CO2: 30 mEq/L (ref 19–32)
CREATININE: 0.61 mg/dL (ref 0.40–1.20)
Calcium: 9.4 mg/dL (ref 8.4–10.5)
GFR: 98.24 mL/min (ref >60.00)
Glucose, Bld: 134 mg/dL — ABNORMAL HIGH (ref 70–99)
Potassium: 3.9 mEq/L (ref 3.5–5.1)
SODIUM: 135 meq/L (ref 135–145)
TOTAL PROTEIN: 6.5 g/dL (ref 6.0–8.3)
Total Bilirubin: 0.5 mg/dL (ref 0.2–1.2)

## 2017-04-14 LAB — MICROALBUMIN / CREATININE URINE RATIO
Creatinine,U: 52 mg/dL
Microalb Creat Ratio: 1.3 mg/g (ref 0.0–30.0)
Microalb, Ur: <0.7 mg/dL (ref 0.0–1.9)

## 2017-04-14 LAB — POCT GLYCOSYLATED HEMOGLOBIN (HGB A1C): Hemoglobin A1C: 5.9

## 2017-04-14 NOTE — Progress Notes (Signed)
Subjective:  Patient ID: Leah Holmes, female    DOB: [birth year]  Age: 83 y.o. MRN: [id number]  CC: The primary encounter diagnosis was Diabetes mellitus without complication ([place]). Diagnoses of Essential hypertension, benign and Intention tremor were also pertinent to this visit.  HPI [name] presents for follow up on multiple issues   type 2 DM: 3 month follow up on diabetes.  Patient has minimal complaints today.  Patient is following a low glycemic index diet and taking all prescribed medications regularly without side effects.  Fasting sugars have been under less than 140 most of the time and post prandials have been under 160 except on rare occasions. Patient is exercising about 3 times per week and intentionally trying to lose weight .  Patient has had an eye exam in the last 12 months and checks feet regularly for signs of infection.  Patient does not walk barefoot outside,  And denies an numbness tingling or burning in feet. Patient is up to date on all recommended vaccinations controlled with glipizide low dose   Home BS readings ar excellent   Lab Results  Component Value Date   HGBA1C 5.9 04/14/2017   She continues to report a bilateral hand tremor right is worse than left.  Tolerating propranolol by her cardiologist [name]   Constipation :  Taking  metamucil prn,  Prune juice prn for near immediate relief   Sleeping fine    Only takes amlodipine for BP > 180 !  Having Cataract surgery soon on [place] right eye.  Left was done years ago   Had shingles for 2 weeks pain without rash .    Outpatient Medications Prior to Visit  Medication Sig Dispense Refill  . amLODipine (NORVASC) 5 MG tablet TAKE ONE TABLET BY MOUTH ONCE DAILY 90 tablet 3  . aspirin EC 81 MG tablet Take 81 mg by mouth daily. In am.    . Cholecalciferol (VITAMIN D3) 1000 UNITS CAPS Take 1,000 Units by mouth daily. In am.    . docusate sodium (COLACE) 50 MG capsule Take 50-100 mg by  mouth daily as needed for mild constipation.     Marland Kitchen glipiZIDE (GLUCOTROL) 5 MG tablet Take 0.5 tablets (2.5 mg total) by mouth daily before supper. 45 tablet 2  . glucose blood (ONE TOUCH ULTRA TEST) test strip USE ONE STRIP TO CHECK GLUCOSE TWICE DAILY 200 each 3  . hydrochlorothiazide (HYDRODIURIL) 25 MG tablet Take 25 mg by mouth every morning.    . isosorbide mononitrate (IMDUR) 60 MG 24 hr tablet Take 60 mg by mouth 2 (two) times daily.    Marland Kitchen lisinopril (PRINIVIL,ZESTRIL) 20 MG tablet TAKE 1 TABLET BY MOUTH TWICE DAILY 60 tablet 5  . meclizine (ANTIVERT) 25 MG tablet TAKE ONE TABLET BY MOUTH THREE TIMES DAILY AS NEEDED 90 tablet 1  . nitroGLYCERIN (NITROSTAT) 0.4 MG SL tablet Place 0.4 mg under the tongue every 5 (five) minutes as needed for chest pain.    Marland Kitchen omeprazole (PRILOSEC) 20 MG capsule TAKE ONE CAPSULE BY MOUTH ONCE DAILY 90 capsule 1  . ONETOUCH DELICA LANCETS 81W MISC USE 1  TO CHECK GLUCOSE TWICE DAILY 200 each 0  . propranolol (INDERAL) 10 MG tablet     . psyllium (METAMUCIL) 58.6 % packet Take 1 packet by mouth daily as needed (for constipation).     . ranolazine (RANEXA) 500 MG 12 hr tablet Take 1 tablet (500 mg total) by mouth 2 (two) times daily. 60 tablet  2  . warfarin (COUMADIN) 2 MG tablet Take 4 mg by mouth daily at 6 PM.     . acyclovir (ZOVIRAX) 200 MG capsule Take 1 capsule (200 mg total) by mouth 5 (five) times daily. FOR ONE WEEK FOR SHINGLES (Patient not taking: Reported on 04/14/2017) 35 capsule 0  . ciprofloxacin (CIPRO) 250 MG tablet Take 1 tablet (250 mg total) by mouth 2 (two) times daily. (Patient not taking: Reported on 04/14/2017) 10 tablet 0  . gabapentin (NEURONTIN) 100 MG capsule Take 1 capsule (100 mg total) by mouth 3 (three) times daily. INCREASE AS NEEDED GRADUALLY  TO 3 CAPSULES 3 TIMES DAILY (Patient not taking: Reported on 04/14/2017) 90 capsule 3  . lidocaine (LIDODERM) 5 % Place 1 patch onto the skin daily. Remove & Discard patch within 12 hours or as  directed by MD (Patient not taking: Reported on 04/14/2017) 30 patch 0  . omeprazole (PRILOSEC) 20 MG capsule Take 20 mg by mouth daily.    . phenazopyridine (PYRIDIUM) 200 MG tablet Take 1 tablet (200 mg total) by mouth 3 (three) times daily as needed for pain. (Patient not taking: Reported on 04/14/2017) 10 tablet 0   No facility-administered medications prior to visit.     Review of Systems;  Patient denies headache, fevers, malaise, unintentional weight loss, skin rash, eye pain, sinus congestion and sinus pain, sore throat, dysphagia,  hemoptysis , cough, dyspnea, wheezing, chest pain, palpitations, orthopnea, edema, abdominal pain, nausea, melena, diarrhea, constipation, flank pain, dysuria, hematuria, urinary  Frequency, nocturia, numbness, tingling, seizures,  Focal weakness, Loss of consciousness,  Tremor, insomnia, depression, anxiety, and suicidal ideation.      Objective:  BP (!) 114/58 (BP Location: Left Arm, Patient Position: Sitting, Cuff Size: Normal)   Pulse 78   Temp 98 F (36.7 C) (Oral)   Resp 15   Ht 5\' 6"  (1.676 m)   Wt 136 lb 9.6 oz (62 kg)   SpO2 96%   BMI 22.05 kg/m   BP Readings from Last 3 Encounters:  04/14/17 (!) 114/58  01/19/17 130/74  12/17/16 (!) 154/88    Wt Readings from Last 3 Encounters:  04/14/17 136 lb 9.6 oz (62 kg)  12/17/16 137 lb (62.1 kg)  10/23/16 138 lb (62.6 kg)    General appearance: alert, cooperative and appears stated age Ears: normal TM's and external ear canals both ears Throat: lips, mucosa, and tongue normal; teeth and gums normal Neck: no adenopathy, no carotid bruit, supple, symmetrical, trachea midline and thyroid not enlarged, symmetric, no tenderness/mass/nodules Back: symmetric, no curvature. ROM normal. No CVA tenderness. Lungs: clear to auscultation bilaterally Heart: regular rate and rhythm, S1, S2 normal, no murmur, click, rub or gallop Abdomen: soft, non-tender; bowel sounds normal; no masses,  no  organomegaly Pulses: 2+ and symmetric Skin: Skin color, texture, turgor normal. No rashes or lesions Lymph nodes: Cervical, supraclavicular, and axillary nodes normal.  Lab Results  Component Value Date   HGBA1C 5.9 04/14/2017   HGBA1C 6.9 (H) 10/13/2016   HGBA1C 6.8 (H) 03/09/2016    Lab Results  Component Value Date   CREATININE 0.61 04/14/2017   CREATININE 0.65 10/23/2016   CREATININE 0.74 10/13/2016    Lab Results  Component Value Date   WBC 5.1 10/23/2016   HGB 14.2 10/23/2016   HCT 41.4 10/23/2016   PLT 207 10/23/2016   GLUCOSE 134 (H) 04/14/2017   CHOL 204 (H) 10/30/2015   TRIG 94.0 10/30/2015   HDL 57.80 10/30/2015   LDLDIRECT  116.0 10/30/2015   LDLCALC 128 (H) 10/30/2015   ALT 17 04/14/2017   AST 20 04/14/2017   NA 135 04/14/2017   K 3.9 04/14/2017   CL 99 04/14/2017   CREATININE 0.61 04/14/2017   BUN 12 04/14/2017   CO2 30 04/14/2017   TSH 0.82 10/13/2016   INR 2.00 10/23/2016   HGBA1C 5.9 04/14/2017   MICROALBUR <0.7 04/14/2017    Dg Chest 2 View  Result Date: 12/17/2016 CLINICAL DATA:  Gradual onset of right posterior, right flank, and low back pain over the past 2 days with symptoms greatest today. No other symptoms. History of a pacemaker, CHF, coronary artery disease, irritable bowel disease, diabetes. EXAM: CHEST  2 VIEW COMPARISON:  Chest x-ray of Oct 23, 2016 FINDINGS: The lungs are hyperinflated. The interstitial markings are mildly prominent but stable. There is no alveolar infiltrate, pleural effusion, or pneumothorax. The heart and pulmonary vascularity are normal. There is calcification in the wall of the thoracic aorta. The ICD is in stable position. There is multilevel degenerative disc disease of the thoracic spine. IMPRESSION: COPD.  No pneumonia, CHF, nor other acute cardiopulmonary disease. Thoracic aortic atherosclerosis. Degenerative disc disease at multiple thoracic levels. Electronically Signed   By: David  Martinique M.D.   On: 12/17/2016  17:01    Assessment & Plan:   Problem List Items Addressed This Visit    Diabetes mellitus without complication ([place]) - Primary    Currently well-controlled on current medications .  hemoglobin A1c is at goal of less than 7.0 . Patient is reminded to schedule an annual eye exam and foot exam is normal today. Patient has no microalbuminuria. Patient is tolerating statin therapy for CAD risk reduction and on ACE/ARB for renal protection and hypertension   Lab Results  Component Value Date   MICROALBUR <0.7 04/14/2017   Lab Results  Component Value Date   HGBA1C 5.9 04/14/2017         Relevant Orders   POCT HgB A1C (Completed)   Microalbumin / creatinine urine ratio (Completed)   Comprehensive metabolic panel (Completed)   Essential hypertension, benign    Advised to start taking amlodipine for bp > 140  Lab Results  Component Value Date   CREATININE 0.61 04/14/2017    Lab Results  Component Value Date   NA 135 04/14/2017   K 3.9 04/14/2017   CL 99 04/14/2017   CO2 30 04/14/2017   0.      Intention tremor    Reassurance provided that the tremor is benign         I have discontinued Ms. Bambach's lidocaine, gabapentin, acyclovir, phenazopyridine, and ciprofloxacin. I am also having her maintain her nitroGLYCERIN, aspirin EC, warfarin, psyllium, Vitamin D3, isosorbide mononitrate, hydrochlorothiazide, docusate sodium, amLODipine, propranolol, ranolazine, glipiZIDE, omeprazole, meclizine, glucose blood, ONETOUCH DELICA LANCETS 37J, and lisinopril.  No orders of the defined types were placed in this encounter.   Medications Discontinued During This Encounter  Medication Reason  . acyclovir (ZOVIRAX) 200 MG capsule Patient has not taken in last 30 days  . ciprofloxacin (CIPRO) 250 MG tablet Patient has not taken in last 30 days  . gabapentin (NEURONTIN) 100 MG capsule Patient has not taken in last 30 days  . lidocaine (LIDODERM) 5 % Patient has not taken in last 30  days  . omeprazole (PRILOSEC) 20 MG capsule Duplicate  . phenazopyridine (PYRIDIUM) 200 MG tablet Patient has not taken in last 30 days    Follow-up: No Follow-up on file.  Crecencio Mc, MD

## 2017-04-14 NOTE — Patient Instructions (Addendum)
Your diabetes is under excellent control.  You can Stop checking your blood sugars.  Only check it if you feel bad!     Take the amlodipine if your Blood pressure is > 140/80  And do not take it if your blood pressure is  below  That   You received the flu vaccine today  i'll see you in 6 months

## 2017-04-15 ENCOUNTER — Encounter: Payer: Self-pay | Admitting: Internal Medicine

## 2017-04-15 DIAGNOSIS — G252 Other specified forms of tremor: Secondary | ICD-10-CM | POA: Insufficient documentation

## 2017-04-15 NOTE — Assessment & Plan Note (Signed)
Currently well-controlled on current medications .  hemoglobin A1c is at goal of less than 7.0 . Patient is reminded to schedule an annual eye exam and foot exam is normal today. Patient has no microalbuminuria. Patient is tolerating statin therapy for CAD risk reduction and on ACE/ARB for renal protection and hypertension   Lab Results  Component Value Date   MICROALBUR <0.7 04/14/2017   Lab Results  Component Value Date   HGBA1C 5.9 04/14/2017

## 2017-04-15 NOTE — Assessment & Plan Note (Signed)
Reassurance provided that the tremor is benign

## 2017-04-15 NOTE — Assessment & Plan Note (Signed)
Advised to start taking amlodipine for bp > 140  Lab Results  Component Value Date   CREATININE 0.61 04/14/2017    Lab Results  Component Value Date   NA 135 04/14/2017   K 3.9 04/14/2017   CL 99 04/14/2017   CO2 30 04/14/2017   0.

## 2017-04-16 DIAGNOSIS — H40031 Anatomical narrow angle, right eye: Secondary | ICD-10-CM | POA: Diagnosis not present

## 2017-04-29 DIAGNOSIS — I482 Chronic atrial fibrillation: Secondary | ICD-10-CM | POA: Diagnosis not present

## 2017-05-25 DIAGNOSIS — I495 Sick sinus syndrome: Secondary | ICD-10-CM | POA: Diagnosis not present

## 2017-05-27 DIAGNOSIS — I482 Chronic atrial fibrillation: Secondary | ICD-10-CM | POA: Diagnosis not present

## 2017-06-29 DIAGNOSIS — I482 Chronic atrial fibrillation: Secondary | ICD-10-CM | POA: Diagnosis not present

## 2017-07-28 DIAGNOSIS — I482 Chronic atrial fibrillation: Secondary | ICD-10-CM | POA: Diagnosis not present

## 2017-08-07 ENCOUNTER — Ambulatory Visit
Admission: EM | Admit: 2017-08-07 | Discharge: 2017-08-07 | Disposition: A | Discharge status: Home / Self Care | Payer: Medicare Other | Attending: Orthopedic Surgery | Admitting: Orthopedic Surgery

## 2017-08-07 ENCOUNTER — Ambulatory Visit: Payer: Medicare Other

## 2017-08-07 ENCOUNTER — Encounter: Payer: Self-pay | Admitting: Gynecology

## 2017-08-07 ENCOUNTER — Other Ambulatory Visit: Payer: Self-pay

## 2017-08-07 DIAGNOSIS — Z880 Allergy status to penicillin: Secondary | ICD-10-CM | POA: Diagnosis not present

## 2017-08-07 DIAGNOSIS — Z86010 Personal history of colonic polyps: Secondary | ICD-10-CM | POA: Diagnosis not present

## 2017-08-07 DIAGNOSIS — Z7982 Long term (current) use of aspirin: Secondary | ICD-10-CM | POA: Insufficient documentation

## 2017-08-07 DIAGNOSIS — I495 Sick sinus syndrome: Secondary | ICD-10-CM | POA: Diagnosis not present

## 2017-08-07 DIAGNOSIS — E119 Type 2 diabetes mellitus without complications: Secondary | ICD-10-CM | POA: Insufficient documentation

## 2017-08-07 DIAGNOSIS — Z7901 Long term (current) use of anticoagulants: Secondary | ICD-10-CM | POA: Diagnosis not present

## 2017-08-07 DIAGNOSIS — K589 Irritable bowel syndrome without diarrhea: Secondary | ICD-10-CM | POA: Insufficient documentation

## 2017-08-07 DIAGNOSIS — K620 Anal polyp: Secondary | ICD-10-CM | POA: Diagnosis not present

## 2017-08-07 DIAGNOSIS — Z885 Allergy status to narcotic agent status: Secondary | ICD-10-CM | POA: Diagnosis not present

## 2017-08-07 DIAGNOSIS — E785 Hyperlipidemia, unspecified: Secondary | ICD-10-CM | POA: Insufficient documentation

## 2017-08-07 DIAGNOSIS — Z888 Allergy status to other drugs, medicaments and biological substances status: Secondary | ICD-10-CM | POA: Diagnosis not present

## 2017-08-07 DIAGNOSIS — I4891 Unspecified atrial fibrillation: Secondary | ICD-10-CM | POA: Diagnosis not present

## 2017-08-07 DIAGNOSIS — K219 Gastro-esophageal reflux disease without esophagitis: Secondary | ICD-10-CM | POA: Insufficient documentation

## 2017-08-07 DIAGNOSIS — I110 Hypertensive heart disease with heart failure: Secondary | ICD-10-CM | POA: Diagnosis not present

## 2017-08-07 DIAGNOSIS — M7732 Calcaneal spur, left foot: Secondary | ICD-10-CM | POA: Insufficient documentation

## 2017-08-07 DIAGNOSIS — I509 Heart failure, unspecified: Secondary | ICD-10-CM | POA: Insufficient documentation

## 2017-08-07 DIAGNOSIS — Z79899 Other long term (current) drug therapy: Secondary | ICD-10-CM | POA: Insufficient documentation

## 2017-08-07 DIAGNOSIS — M79672 Pain in left foot: Secondary | ICD-10-CM | POA: Diagnosis not present

## 2017-08-07 DIAGNOSIS — M7989 Other specified soft tissue disorders: Secondary | ICD-10-CM | POA: Diagnosis not present

## 2017-08-07 DIAGNOSIS — I2510 Atherosclerotic heart disease of native coronary artery without angina pectoris: Secondary | ICD-10-CM | POA: Diagnosis not present

## 2017-08-07 DIAGNOSIS — Z7984 Long term (current) use of oral hypoglycemic drugs: Secondary | ICD-10-CM | POA: Insufficient documentation

## 2017-08-07 NOTE — ED Provider Notes (Signed)
[facility]    CSN: [id number] Arrival date & time: 08/07/17  1053     History   Chief Complaint Chief Complaint  Patient presents with  . Foot Pain    HPI Leah Holmes is a 84 y.o. female presents to the urgent care facility for evaluation of left foot pain.  Patient states her foot pain began 1 day ago.  She denies any trauma or injury.  She has been ambulatory with no significant discomfort.  She describes tender area to touch along the dorsal aspect of her left foot along the fourth metatarsal.  There is been no warmth or redness.  No drainage.  Patient denies any new numbness or tingling, has history of neuropathy that is at its baseline.  HPI  Past Medical History:  Diagnosis Date  . Atrial fibrillation ([place]) 2012  . CHF (congestive heart failure) ([place])   . Colon polyp 2003  . Coronary artery disease   . Diabetes mellitus without complication ([place])   . GERD (gastroesophageal reflux disease)   . Hyperlipidemia   . Hypertension   . Irritable bowel syndrome   . Polyp of colon   . PONV (postoperative nausea and vomiting)    with ether, not recently  . Ulcer     Patient Active Problem List   Diagnosis Date Noted  . Intention tremor 04/15/2017  . Osteoporosis 03/21/2016  . Sick sinus syndrome ([place]) 01/28/2016  . Other malaise 11/01/2015  . Major depressive disorder, recurrent episode ([place]) 10/30/2015  . Constipation 07/27/2015  . Generalized anxiety disorder 04/02/2015  . Ischemic chest pain 01/08/2015  . Atrial fibrillation ([place]) 08/16/2014  . Medicare annual wellness visit, subsequent 05/23/2014  . Dry mouth 03/10/2014  . Low serum vitamin D 02/16/2014  . Dysphagia, unspecified(787.20) 09/20/2013  . Anal polyp 09/20/2013  . Personal history of colonic polyps 08/31/2013  . CAD (coronary artery disease) 03/09/2013  . Diabetes mellitus without complication ([place]) 02/40/9735  . Hyperlipidemia associated with type 2 diabetes mellitus ([place]) 03/09/2013    . Essential hypertension, benign 03/09/2013  . Nonulcer dyspepsia 03/09/2013    Past Surgical History:  Procedure Laterality Date  . ABDOMINAL ADHESION SURGERY    . ABDOMINAL HYSTERECTOMY    . APPENDECTOMY    . BREAST SURGERY Right    biopsy   . CESAREAN SECTION     x 2  . CHOLECYSTECTOMY  03-2012  . COLONOSCOPY  2003   Dr Theodosia Paling in [place]  . fibroid tumor    . PACEMAKER INSERTION N/A 01/28/2016   Procedure: INSERTION PACEMAKER;  Surgeon: Isaias Cowman, MD;  Location: [facility] ORS;  Service: Cardiovascular;  Laterality: N/A;  . UPPER GI ENDOSCOPY  2003   Dr Theodosia Paling in [place]    OB History    Gravida Para Term Preterm AB Living   2 2           SAB TAB Ectopic Multiple Live Births                  Obstetric Comments   1st Menstrual Cycle:  16 1st Pregnancy:  30       Home Medications    Prior to Admission medications   Medication Sig Start Date End Date Taking? Authorizing Provider  amLODipine (NORVASC) 5 MG tablet TAKE ONE TABLET BY MOUTH ONCE DAILY 09/23/16  Yes Crecencio Mc, MD  aspirin EC 81 MG tablet Take 81 mg by mouth daily. In am.   Yes [provider]  Cholecalciferol (VITAMIN  D3) 1000 UNITS CAPS Take 1,000 Units by mouth daily. In am.   Yes [provider]  docusate sodium (COLACE) 50 MG capsule Take 50-100 mg by mouth daily as needed for mild constipation.    Yes [provider]  glipiZIDE (GLUCOTROL) 5 MG tablet Take 0.5 tablets (2.5 mg total) by mouth daily before supper. 12/09/16  Yes Crecencio Mc, MD  glucose blood (ONE TOUCH ULTRA TEST) test strip USE ONE STRIP TO CHECK GLUCOSE TWICE DAILY 01/29/17  Yes Crecencio Mc, MD  hydrochlorothiazide (HYDRODIURIL) 25 MG tablet Take 25 mg by mouth every morning. 12/12/15  Yes [provider]  isosorbide mononitrate (IMDUR) 60 MG 24 hr tablet Take 60 mg by mouth 2 (two) times daily. 12/27/15  Yes [provider]  lisinopril (PRINIVIL,ZESTRIL) 20 MG tablet TAKE 1  TABLET BY MOUTH TWICE DAILY 03/24/17  Yes Crecencio Mc, MD  meclizine (ANTIVERT) 25 MG tablet TAKE ONE TABLET BY MOUTH THREE TIMES DAILY AS NEEDED 01/18/17  Yes Crecencio Mc, MD  nitroGLYCERIN (NITROSTAT) 0.4 MG SL tablet Place 0.4 mg under the tongue every 5 (five) minutes as needed for chest pain.   Yes [provider]  omeprazole (PRILOSEC) 20 MG capsule TAKE ONE CAPSULE BY MOUTH ONCE DAILY 12/18/16  Yes Crecencio Mc, MD  [facility] DELICA LANCETS 03T MISC USE 1  TO CHECK GLUCOSE TWICE DAILY 01/29/17  Yes Crecencio Mc, MD  propranolol (INDERAL) 10 MG tablet  09/10/16  Yes [provider]  psyllium (METAMUCIL) 58.6 % packet Take 1 packet by mouth daily as needed (for constipation).    Yes [provider]  ranolazine (RANEXA) 500 MG 12 hr tablet Take 1 tablet (500 mg total) by mouth 2 (two) times daily. 10/23/16  Yes Earleen Newport, MD  warfarin (COUMADIN) 2 MG tablet Take 4 mg by mouth daily at 6 PM.    Yes [provider]    Family History Family History  Problem Relation Age of Onset  . Cancer Sister 25       breast    Social History Social History   Tobacco Use  . Smoking status: Never Smoker  . Smokeless tobacco: Former Systems developer    Types: Snuff  . Tobacco comment: occasionally  Substance Use Topics  . Alcohol use: No  . Drug use: No     Allergies   Codeine; Penicillins; Atorvastatin; Clonidine derivatives; Gabapentin; Losartan; Morphine; Morphine and related; Reglan [metoclopramide]; and Hydralazine   Review of Systems Review of Systems  Musculoskeletal: Positive for arthralgias. Negative for gait problem and joint swelling.     Physical Exam Triage Vital Signs ED Triage Vitals  Enc Vitals Group     BP 08/07/17 1123 139/68     Pulse Rate 08/07/17 1123 77     Resp 08/07/17 1123 16     Temp 08/07/17 1123 97.7 F (36.5 C)     Temp Source 08/07/17 1123 Oral     SpO2 08/07/17 1123 98 %     Weight 08/07/17 1121 139 lb (63  kg)     Height 08/07/17 1121 5\' 6"  (1.676 m)     Head Circumference --      Peak Flow --      Pain Score 08/07/17 1121 5     Pain Loc --      Pain Edu? --      Excl. in [place]? --    No data found.  Updated Vital Signs BP 139/68 (  BP Location: Right Arm)   Pulse 77   Temp 97.7 F (36.5 C) (Oral)   Resp 16   Ht 5\' 6"  (1.676 m)   Wt 139 lb (63 kg)   SpO2 98%   BMI 22.44 kg/m   Visual Acuity Right Eye Distance:   Left Eye Distance:   Bilateral Distance:    Right Eye Near:   Left Eye Near:    Bilateral Near:     Physical Exam  Constitutional: She is oriented to person, place, and time. She appears well-developed and well-nourished.  HENT:  Head: Normocephalic and atraumatic.  Cardiovascular: Normal rate.  Pulmonary/Chest: Effort normal. No respiratory distress.  Musculoskeletal:  Examination of the left foot shows the patient has no swelling warmth erythema or edema.  Pulses are present along the dorsalis pedis pulse.  She is point tender along the 1 cm diameter cystlike structure along the dorsal aspect of the foot along the dorsal aspect of the fourth metatarsal.  Cyst is slightly mobile.  Cyst is not fluctuant with no warmth or erythema.  She is nontender throughout the remainder of the foot.  No swelling or edema throughout the bilateral lower extremities with no calf tenderness, warmth or redness.  She does have evidence of varicose veins in the left leg.  Neurological: She is oriented to person, place, and time.  Skin: No rash noted.  Psychiatric: She has a normal mood and affect. Her behavior is normal. Judgment and thought content normal.     UC Treatments / Results  Labs (all labs ordered are listed, but only abnormal results are displayed) Labs Reviewed - No data to display  EKG  EKG Interpretation None       Radiology Dg Foot Complete Left  Result Date: 08/07/2017 CLINICAL DATA:  Pt denies injury. C/o bruising, swelling top of left foot. Pt states she is  on Warfarin. EXAM: LEFT FOOT - COMPLETE 3+ VIEW COMPARISON:  None. FINDINGS: No fracture.  No bone lesion.  Bones are demineralized. The joints are normally spaced and aligned. Arterial vascular calcifications are noted extending from the ankle through the foot. Soft tissues are otherwise unremarkable. Small dorsal and plantar calcaneal spurs. IMPRESSION: 1. No fracture, bone lesion or joint abnormality. 2. Small heel spurs. Electronically Signed   By: Lajean Manes M.D.   On: 08/07/2017 12:16    Procedures Procedures (including critical care time)  Medications Ordered in UC Medications - No data to display   Initial Impression / Assessment and Plan / UC Course  I have reviewed the triage vital signs and the nursing notes.  Pertinent labs & imaging results that were available during my care of the patient were reviewed by me and considered in my medical decision making (see chart for details).    84 year old female with cystlike structure on the dorsal aspect of her left foot.  X-ray showed no evidence of acute bony abnormality.  No significant spurring or degenerative changes.  Recommend compression with Ace wrap, limit weightbearing activities.  Recommend follow-up with podiatry.  She will call podiatrist Monday morning to schedule follow-up appointment. Final Clinical Impressions(s) / UC Diagnoses   Final diagnoses:  Foot pain, left    ED Discharge Orders    None        Duanne Guess, [place] 08/07/17 1246

## 2017-08-07 NOTE — Discharge Instructions (Signed)
Please limit weightbearing, apply compressive wrap to the top of the foot if tolerated.  Follow-up with foot doctor next week if no improvement.

## 2017-08-07 NOTE — ED Triage Notes (Signed)
Per patient painful /soarness/ knot on left leg.

## 2017-08-14 ENCOUNTER — Other Ambulatory Visit: Payer: Self-pay | Admitting: Internal Medicine

## 2017-08-19 ENCOUNTER — Other Ambulatory Visit: Payer: Self-pay | Admitting: *Deleted

## 2017-08-19 MED ORDER — LISINOPRIL 20 MG PO TABS: 20.0000 mg | ORAL_TABLET | Freq: Two times a day (BID) | ORAL | 0 refills | Status: DC

## 2017-09-02 DIAGNOSIS — I495 Sick sinus syndrome: Secondary | ICD-10-CM | POA: Diagnosis not present

## 2017-09-02 DIAGNOSIS — I071 Rheumatic tricuspid insufficiency: Secondary | ICD-10-CM | POA: Diagnosis not present

## 2017-09-02 DIAGNOSIS — I25118 Atherosclerotic heart disease of native coronary artery with other forms of angina pectoris: Secondary | ICD-10-CM | POA: Diagnosis not present

## 2017-09-02 DIAGNOSIS — I340 Nonrheumatic mitral (valve) insufficiency: Secondary | ICD-10-CM | POA: Diagnosis not present

## 2017-09-02 DIAGNOSIS — I10 Essential (primary) hypertension: Secondary | ICD-10-CM | POA: Diagnosis not present

## 2017-09-02 DIAGNOSIS — I482 Chronic atrial fibrillation: Secondary | ICD-10-CM | POA: Diagnosis not present

## 2017-09-02 DIAGNOSIS — E782 Mixed hyperlipidemia: Secondary | ICD-10-CM | POA: Diagnosis not present

## 2017-10-05 DIAGNOSIS — I482 Chronic atrial fibrillation: Secondary | ICD-10-CM | POA: Diagnosis not present

## 2017-10-15 ENCOUNTER — Other Ambulatory Visit: Payer: Self-pay

## 2017-10-15 MED ORDER — LISINOPRIL 20 MG PO TABS: 20.0000 mg | ORAL_TABLET | Freq: Two times a day (BID) | ORAL | 0 refills | Status: DC

## 2017-10-16 ENCOUNTER — Other Ambulatory Visit: Payer: Self-pay | Admitting: Internal Medicine

## 2017-10-20 ENCOUNTER — Other Ambulatory Visit: Payer: Self-pay

## 2017-10-20 ENCOUNTER — Encounter: Payer: Self-pay | Admitting: Emergency Medicine

## 2017-10-20 ENCOUNTER — Emergency Department
Admission: EM | Admit: 2017-10-20 | Discharge: 2017-10-20 | Disposition: A | Discharge status: Home / Self Care | Payer: Medicare Other | Attending: Student in an Organized Health Care Education/Training Program | Admitting: Student in an Organized Health Care Education/Training Program

## 2017-10-20 ENCOUNTER — Emergency Department: Payer: Medicare Other

## 2017-10-20 ENCOUNTER — Ambulatory Visit: Payer: Self-pay

## 2017-10-20 DIAGNOSIS — Z950 Presence of cardiac pacemaker: Secondary | ICD-10-CM | POA: Insufficient documentation

## 2017-10-20 DIAGNOSIS — Z7984 Long term (current) use of oral hypoglycemic drugs: Secondary | ICD-10-CM | POA: Insufficient documentation

## 2017-10-20 DIAGNOSIS — Z7901 Long term (current) use of anticoagulants: Secondary | ICD-10-CM | POA: Insufficient documentation

## 2017-10-20 DIAGNOSIS — I4891 Unspecified atrial fibrillation: Secondary | ICD-10-CM | POA: Diagnosis not present

## 2017-10-20 DIAGNOSIS — I509 Heart failure, unspecified: Secondary | ICD-10-CM | POA: Diagnosis not present

## 2017-10-20 DIAGNOSIS — E119 Type 2 diabetes mellitus without complications: Secondary | ICD-10-CM | POA: Diagnosis not present

## 2017-10-20 DIAGNOSIS — Z7982 Long term (current) use of aspirin: Secondary | ICD-10-CM | POA: Diagnosis not present

## 2017-10-20 DIAGNOSIS — R5381 Other malaise: Secondary | ICD-10-CM | POA: Diagnosis not present

## 2017-10-20 DIAGNOSIS — I110 Hypertensive heart disease with heart failure: Secondary | ICD-10-CM | POA: Diagnosis not present

## 2017-10-20 DIAGNOSIS — Z87891 Personal history of nicotine dependence: Secondary | ICD-10-CM | POA: Insufficient documentation

## 2017-10-20 DIAGNOSIS — I2510 Atherosclerotic heart disease of native coronary artery without angina pectoris: Secondary | ICD-10-CM | POA: Diagnosis not present

## 2017-10-20 DIAGNOSIS — R531 Weakness: Secondary | ICD-10-CM | POA: Insufficient documentation

## 2017-10-20 DIAGNOSIS — R0602 Shortness of breath: Secondary | ICD-10-CM | POA: Diagnosis not present

## 2017-10-20 DIAGNOSIS — Z79899 Other long term (current) drug therapy: Secondary | ICD-10-CM | POA: Diagnosis not present

## 2017-10-20 DIAGNOSIS — R251 Tremor, unspecified: Secondary | ICD-10-CM | POA: Diagnosis not present

## 2017-10-20 LAB — URINALYSIS, COMPLETE (UACMP) WITH MICROSCOPIC
BACTERIA UA: NONE SEEN
BILIRUBIN URINE: NEGATIVE
Glucose, UA: NEGATIVE mg/dL
KETONES UR: NEGATIVE mg/dL
LEUKOCYTES UA: NEGATIVE
Nitrite: NEGATIVE
Protein, ur: NEGATIVE mg/dL
Specific Gravity, Urine: 1.004 — ABNORMAL LOW (ref 1.005–1.030)
Squamous Epithelial / LPF: NONE SEEN (ref 0–5)
pH: 7 (ref 5.0–8.0)

## 2017-10-20 LAB — BASIC METABOLIC PANEL
ANION GAP: 6 (ref 5–15)
BUN: 13 mg/dL (ref 6–20)
CO2: 30 mmol/L (ref 22–32)
Calcium: 9.2 mg/dL (ref 8.9–10.3)
Chloride: 97 mmol/L — ABNORMAL LOW (ref 101–111)
Creatinine, Ser: 0.6 mg/dL (ref 0.44–1.00)
GFR calc Af Amer: >60 mL/min (ref >60)
Glucose, Bld: 149 mg/dL — ABNORMAL HIGH (ref 65–99)
Potassium: 4.1 mmol/L (ref 3.5–5.1)
SODIUM: 133 mmol/L — AB (ref 135–145)

## 2017-10-20 LAB — TROPONIN I: Troponin I: <0.03 ng/mL (ref <0.03)

## 2017-10-20 LAB — CBC
HEMATOCRIT: 41.2 % (ref 35.0–47.0)
HEMOGLOBIN: 14 g/dL (ref 12.0–16.0)
MCH: 31.8 pg (ref 26.0–34.0)
MCHC: 34 g/dL (ref 32.0–36.0)
MCV: 93.7 fL (ref 80.0–100.0)
Platelets: 227 10*3/uL (ref 150–440)
RBC: 4.4 MIL/uL (ref 3.80–5.20)
RDW: 13.4 % (ref 11.5–14.5)
WBC: 6.1 10*3/uL (ref 3.6–11.0)

## 2017-10-20 LAB — PROTIME-INR
INR: 2.1
Prothrombin Time: 23.4 seconds — ABNORMAL HIGH (ref 11.4–15.2)

## 2017-10-20 LAB — BRAIN NATRIURETIC PEPTIDE: B NATRIURETIC PEPTIDE 5: 165 pg/mL — AB (ref 0.0–100.0)

## 2017-10-20 MED ORDER — ISOSORBIDE MONONITRATE ER 60 MG PO TB24
60.0000 mg | ORAL_TABLET | Freq: Every day | ORAL | Status: DC
  Administered 2017-10-20: 60 mg via ORAL
  Filled 2017-10-20: qty 1

## 2017-10-20 MED ORDER — ACETAMINOPHEN 325 MG PO TABS
650.0000 mg | ORAL_TABLET | Freq: Once | ORAL | Status: AC
  Administered 2017-10-20: 650 mg via ORAL
  Filled 2017-10-20: qty 2

## 2017-10-20 MED ORDER — AMLODIPINE BESYLATE 5 MG PO TABS
10.0000 mg | ORAL_TABLET | Freq: Once | ORAL | Status: AC
  Administered 2017-10-20: 10 mg via ORAL
  Filled 2017-10-20: qty 2

## 2017-10-20 NOTE — Telephone Encounter (Signed)
Patient's daughter [name] called and says "my mother is not feeling well. She's dizzy, even though she's on meclizine, but she says it's worse."  She put the patient on the phone and the patient says "I am shaky, lightheaded and the room spins a little. I am walking with a cane, but I am weak and SOB." She handed the phone back to [place]. I asked for her BP/P, she says "I checked it right before I called and it was 160/64, 78." According to protocol, see PCP within 24 hours, no availability with PCP, appointment scheduled for tomorrow at 1415 with Almira Coaster, PA, care advice given, daughter verbalized understanding. She says "there are no morning appointments? If she gets worse, I will take her to the emergency room."   Reason for Disposition . [1] MODERATE dizziness (e.g., interferes with normal activities) AND [2] has been evaluated by physician for this  Answer Assessment - Initial Assessment Questions 1. DESCRIPTION: "Describe your dizziness."     Shaky, feel lightheaded 2. LIGHTHEADED: "Do you feel lightheaded?" (e.g., somewhat faint, woozy, weak upon standing)     Lightheaded 3. VERTIGO: "Do you feel like either you or the room is spinning or tilting?" (i.e. vertigo)     Yes 4. SEVERITY: "How bad is it?"  "Do you feel like you are going to faint?" "Can you stand and walk?"   - MILD - walking normally   - MODERATE - interferes with normal activities (e.g., work, school)    - SEVERE - unable to stand, requires support to walk, feels like passing out now.      Moderate 5. ONSET:  "When did the dizziness begin?"     Today 6. AGGRAVATING FACTORS: "Does anything make it worse?" (e.g., standing, change in head position)     Nothing 7. HEART RATE: "Can you tell me your heart rate?" "How many beats in 15 seconds?"  (Note: not all patients can do this)       78 8. CAUSE: "What do you think is causing the dizziness?"     I don't know 9. RECURRENT SYMPTOM: "Have you had dizziness before?" If  so, ask: "When was the last time?" "What happened that time?"     Yes on meclizine 10. OTHER SYMPTOMS: "Do you have any other symptoms?" (e.g., fever, chest pain, vomiting, diarrhea, bleeding)       Weakness, shortness of breath 11. PREGNANCY: "Is there any chance you are pregnant?" "When was your last menstrual period?"       No  Protocols used: DIZZINESS [facility]

## 2017-10-20 NOTE — ED Provider Notes (Signed)
[facility] Emergency Department Provider Note    First MD Initiated Contact with Patient 10/20/17 1708     (approximate)  I have reviewed the triage vital signs and the nursing notes.   HISTORY  Chief Complaint Weakness and Shortness of Breath    HPI Leah Holmes is a 84 y.o. female with a history of A. fib, congestive heart failure and tremor presents to the ER chief complaint of weakness generalized malaise increased shakiness shortness of breath and chest pressure that lasted all day today.  Currently does not have any chest pain or pressure.  No shortness of breath.  Does not feel dizzy at this time.  No nausea or vomiting.  Denies any dysuria.  No measured fevers at home.    Past Medical History:  Diagnosis Date  . Atrial fibrillation ([place]) 2012  . CHF (congestive heart failure) ([place])   . Colon polyp 2003  . Coronary artery disease   . Diabetes mellitus without complication ([place])   . GERD (gastroesophageal reflux disease)   . Hyperlipidemia   . Hypertension   . Irritable bowel syndrome   . Polyp of colon   . PONV (postoperative nausea and vomiting)    with ether, not recently  . Ulcer    Family History  Problem Relation Age of Onset  . Cancer Sister 28       breast   Past Surgical History:  Procedure Laterality Date  . ABDOMINAL ADHESION SURGERY    . ABDOMINAL HYSTERECTOMY    . APPENDECTOMY    . BREAST SURGERY Right    biopsy   . CESAREAN SECTION     x 2  . CHOLECYSTECTOMY  03-2012  . COLONOSCOPY  2003   Dr Theodosia Paling in [place]  . fibroid tumor    . PACEMAKER INSERTION N/A 01/28/2016   Procedure: INSERTION PACEMAKER;  Surgeon: Isaias Cowman, MD;  Location: [facility] ORS;  Service: Cardiovascular;  Laterality: N/A;  . UPPER GI ENDOSCOPY  2003   Dr Theodosia Paling in [place]   Patient Active Problem List   Diagnosis Date Noted  . Intention tremor 04/15/2017  . Osteoporosis 03/21/2016  . Sick sinus syndrome ([place]) 01/28/2016  . Other  malaise 11/01/2015  . Major depressive disorder, recurrent episode ([place]) 10/30/2015  . Constipation 07/27/2015  . Generalized anxiety disorder 04/02/2015  . Ischemic chest pain 01/08/2015  . Atrial fibrillation ([place]) 08/16/2014  . Medicare annual wellness visit, subsequent 05/23/2014  . Dry mouth 03/10/2014  . Low serum vitamin D 02/16/2014  . Dysphagia, unspecified(787.20) 09/20/2013  . Anal polyp 09/20/2013  . Personal history of colonic polyps 08/31/2013  . CAD (coronary artery disease) 03/09/2013  . Diabetes mellitus without complication ([place]) 12/13/1599  . Hyperlipidemia associated with type 2 diabetes mellitus ([place]) 03/09/2013  . Essential hypertension, benign 03/09/2013  . Nonulcer dyspepsia 03/09/2013      Prior to Admission medications   Medication Sig Start Date End Date Taking? Authorizing Provider  amLODipine (NORVASC) 5 MG tablet TAKE ONE TABLET BY MOUTH ONCE DAILY 09/23/16  Yes Crecencio Mc, MD  aspirin EC 81 MG tablet Take 81 mg by mouth daily. In am.   Yes [provider]  Cholecalciferol (VITAMIN D3) 1000 UNITS CAPS Take 1,000 Units by mouth daily. In am.   Yes [provider]  docusate sodium (COLACE) 50 MG capsule Take 50-100 mg by mouth daily as needed for mild constipation.    Yes [provider]  glipiZIDE (GLUCOTROL) 5 MG tablet TAKE  1/2 (ONE-HALF) TABLET BY MOUTH ONCE DAILY BEFORE SUPPER 10/18/17  Yes Crecencio Mc, MD  hydrochlorothiazide (HYDRODIURIL) 25 MG tablet Take 25 mg by mouth every morning. 12/12/15  Yes [provider]  isosorbide mononitrate (IMDUR) 60 MG 24 hr tablet Take 60 mg by mouth 2 (two) times daily. 12/27/15  Yes [provider]  lisinopril (PRINIVIL,ZESTRIL) 20 MG tablet Take 1 tablet (20 mg total) by mouth 2 (two) times daily. 10/15/17  Yes Crecencio Mc, MD  meclizine (ANTIVERT) 25 MG tablet TAKE ONE TABLET BY MOUTH THREE TIMES DAILY AS NEEDED 01/18/17  Yes Crecencio Mc, MD  nitroGLYCERIN  (NITROSTAT) 0.4 MG SL tablet Place 0.4 mg under the tongue every 5 (five) minutes as needed for chest pain.   Yes [provider]  omeprazole (PRILOSEC) 20 MG capsule TAKE 1 CAPSULE BY MOUTH ONCE DAILY 08/16/17  Yes Crecencio Mc, MD  propranolol (INDERAL) 10 MG tablet Take 10 mg by mouth daily.  09/10/16  Yes [provider]  psyllium (METAMUCIL) 58.6 % packet Take 1 packet by mouth daily as needed (for constipation).    Yes [provider]  warfarin (COUMADIN) 2 MG tablet Take 4 mg by mouth daily at 6 PM.    Yes [provider]  glucose blood (ONE TOUCH ULTRA TEST) test strip USE ONE STRIP TO CHECK GLUCOSE TWICE DAILY 01/29/17   Crecencio Mc, MD  [facility] DELICA LANCETS 83J MISC USE 1  TO CHECK GLUCOSE TWICE DAILY 01/29/17   Crecencio Mc, MD  ranolazine (RANEXA) 500 MG 12 hr tablet Take 1 tablet (500 mg total) by mouth 2 (two) times daily. Patient not taking: Reported on 10/20/2017 10/23/16   Earleen Newport, MD    Allergies Codeine; Penicillins; Atorvastatin; Clonidine derivatives; Gabapentin; Losartan; Morphine; Morphine and related; Reglan [metoclopramide]; and Hydralazine    Social History Social History   Tobacco Use  . Smoking status: Never Smoker  . Smokeless tobacco: Former Systems developer    Types: Snuff  . Tobacco comment: occasionally  Substance Use Topics  . Alcohol use: No  . Drug use: No    Review of Systems Patient denies headaches, rhinorrhea, blurry vision, numbness, shortness of breath, chest pain, edema, cough, abdominal pain, nausea, vomiting, diarrhea, dysuria, fevers, rashes or hallucinations unless otherwise stated above in HPI. ____________________________________________   PHYSICAL EXAM:  VITAL SIGNS: Vitals:   10/20/17 2100 10/20/17 2118  BP: (!) 155/73   Pulse: 62 76  Resp:    Temp:    SpO2: 98% 99%    Constitutional: Alert and oriented. Well appearing and in no acute distress. Eyes: Conjunctivae are normal.    Head: Atraumatic. Nose: No congestion/rhinnorhea. Mouth/Throat: Mucous membranes are moist.   Neck: No stridor. Painless ROM.  Cardiovascular: Normal rate, regular rhythm. Grossly normal heart sounds.  Good peripheral circulation. Respiratory: Normal respiratory effort.  No retractions. Lungs CTAB. Gastrointestinal: Soft and nontender. No distention. No abdominal bruits. No CVA tenderness. Genitourinary:  Musculoskeletal: No lower extremity tenderness nor edema.  No joint effusions. Neurologic:  Bilateral resting tremor.  Normal speech and language. No gross focal neurologic deficits are appreciated. No facial droop Skin:  Skin is warm, dry and intact. No rash noted. Psychiatric: Mood and affect are normal. Speech and behavior are normal.  ____________________________________________   LABS (all labs ordered are listed, but only abnormal results are displayed)  Results for orders placed or performed during the hospital encounter of 10/20/17 (from the past 24 hour(s))  Basic  metabolic panel     Status: Abnormal   Collection Time: 10/20/17  2:51 PM  Result Value Ref Range   Sodium 133 (L) 135 - 145 mmol/L   Potassium 4.1 3.5 - 5.1 mmol/L   Chloride 97 (L) 101 - 111 mmol/L   CO2 30 22 - 32 mmol/L   Glucose, Bld 149 (H) 65 - 99 mg/dL   BUN 13 6 - 20 mg/dL   Creatinine, Ser 0.60 0.44 - 1.00 mg/dL   Calcium 9.2 8.9 - 10.3 mg/dL   GFR calc non Af Amer >60 >60 mL/min   GFR calc Af Amer >60 >60 mL/min   Anion gap 6 5 - 15  CBC     Status: None   Collection Time: 10/20/17  2:51 PM  Result Value Ref Range   WBC 6.1 3.6 - 11.0 K/uL   RBC 4.40 3.80 - 5.20 MIL/uL   Hemoglobin 14.0 12.0 - 16.0 g/dL   HCT 41.2 35.0 - 47.0 %   MCV 93.7 80.0 - 100.0 fL   MCH 31.8 26.0 - 34.0 pg   MCHC 34.0 32.0 - 36.0 g/dL   RDW 13.4 11.5 - 14.5 %   Platelets 227 150 - 440 K/uL  Urinalysis, Complete w Microscopic     Status: Abnormal   Collection Time: 10/20/17  5:52 PM  Result Value Ref Range    Color, Urine STRAW (A) YELLOW   APPearance CLEAR (A) CLEAR   Specific Gravity, Urine 1.004 (L) 1.005 - 1.030   pH 7.0 5.0 - 8.0   Glucose, UA NEGATIVE NEGATIVE mg/dL   Hgb urine dipstick SMALL (A) NEGATIVE   Bilirubin Urine NEGATIVE NEGATIVE   Ketones, ur NEGATIVE NEGATIVE mg/dL   Protein, ur NEGATIVE NEGATIVE mg/dL   Nitrite NEGATIVE NEGATIVE   Leukocytes, UA NEGATIVE NEGATIVE   RBC / HPF 0-5 0 - 5 RBC/hpf   WBC, UA 0-5 0 - 5 WBC/hpf   Bacteria, UA NONE SEEN NONE SEEN   Squamous Epithelial / LPF NONE SEEN 0 - 5  Brain natriuretic peptide     Status: Abnormal   Collection Time: 10/20/17  5:52 PM  Result Value Ref Range   B Natriuretic Peptide 165.0 (H) 0.0 - 100.0 pg/mL  Troponin I     Status: None   Collection Time: 10/20/17  5:52 PM  Result Value Ref Range   Troponin I <0.03 <0.03 ng/mL  Protime-INR     Status: Abnormal   Collection Time: 10/20/17  5:52 PM  Result Value Ref Range   Prothrombin Time 23.4 (H) 11.4 - 15.2 seconds   INR 2.10   Troponin I     Status: None   Collection Time: 10/20/17  7:54 PM  Result Value Ref Range   Troponin I <0.03 <0.03 ng/mL   ____________________________________________  EKG My review and personal interpretation at Time: 14:52   Indication: weakness  Rate: 75  Rhythm: unferlying a fib with lbbb and occasional v-paced Axis: left Other: otherwise normal intervals, no stemi ____________________________________________  RADIOLOGY  I personally reviewed all radiographic images ordered to evaluate for the above acute complaints and reviewed radiology reports and findings.  These findings were personally discussed with the patient.  Please see medical record for radiology report.  ____________________________________________   PROCEDURES  Procedure(s) performed:  Procedures    Critical Care performed: no ____________________________________________   INITIAL IMPRESSION / ASSESSMENT AND PLAN / ED COURSE  Pertinent labs & imaging  results that were available during my care of the  patient were reviewed by me and considered in my medical decision making (see chart for details).  DDX: Hydration, ACS, COPD, CHF, pneumonia, UTI, anemia, electrolyte abnormality,  Breah Joa is a 84 y.o. who presents to the ED with symptoms as described above.  Patient in no acute distress.  Patient well-appearing asymptomatic upon arrival to the ER.  Based on his risk factors will order repeat troponin despite normal initial troponin.  EKG shows rate controlled A. fib.  No evidence of CHF or COPD.  Not clinically consistent with dissection or PE.  Unlikely infectious process but will also check urine to evaluate for UTI.  Her abdominal exam is soft benign.  Clinical Course as of Oct 21 2227  Wed Oct 20, 2017  1935 Patient reassessed.  Remains pain-free.  Feels much better now.  Reportedly was feeling much more sluggish when her blood pressure was lower earlier today.  States that she was having pain from 7 and got severe around 11-2 o'clock.  No pain now.  Will order delta troponin stone patient's risk factors history of heart disease continue to monitor patient.   [PR]  2115 Repeat trip is negative.'s does not seem clinically consistent with acute ischemia, ACS.  No focal neuro deficits.  Patient with persistent underlying tremor but patient did not follow-up with neurology.  Will encourage neurology follow-up.  Patient again requesting discharge home.  At this point I do believe she stable and appropriate for further outpatient follow-up.  Have discussed with the patient and available family all diagnostics and treatments performed thus far and all questions were answered to the best of my ability. The patient demonstrates understanding and agreement with plan.    [PR]    Clinical Course User Index [PR] Merlyn Lot, MD     As part of my medical decision making, I reviewed the following data within the [facility] notes reviewed and incorporated, Labs reviewed, notes from prior ED visits and [place] Controlled Substance Database   ____________________________________________   FINAL CLINICAL IMPRESSION(S) / ED DIAGNOSES  Final diagnoses:  Weakness  Shortness of breath      NEW MEDICATIONS STARTED DURING THIS VISIT:  Discharge Medication List as of 10/20/2017  9:20 PM       Note:  This document was prepared using Dragon voice recognition software and may include unintentional dictation errors.    Merlyn Lot, MD 10/20/17 2229

## 2017-10-20 NOTE — ED Notes (Signed)

## 2017-10-20 NOTE — ED Notes (Signed)
Pt reports she had an episode today where she was [facility], pt reports she has a pacemaker and afib, reports she has been compliant with medications. Pt states she has hx of CHF, denies weight gain or swelling to lower extrem. Pt denies CP or SHOB at this time. Pt walking around in rm, stating "when am I getting out of here?" Pt's daughter at bedside. Pt in NAD at this time.

## 2017-10-20 NOTE — ED Notes (Signed)
Pt given sandwich tray and water 

## 2017-10-20 NOTE — ED Notes (Signed)
Informed RN that patient has been roomed and is ready for evaluation.  Patient in NAD at this time and call bell placed within reach.   

## 2017-10-20 NOTE — ED Triage Notes (Signed)
Pt comes into the ED via POV c/o shortness of breath and overall weakness.  Patient has a pacemaker and a-fib.  Patient denies any chest pain at this time.  Patient states she normally has tremors but today they have increased.  Patient had a period this morning where she was short of breath and states that it felt like she was choking.  Patient has even and unlabored respirations at this time and in NAD.

## 2017-10-21 ENCOUNTER — Ambulatory Visit: Payer: Medicare Other | Admitting: Family Medicine

## 2017-10-26 ENCOUNTER — Ambulatory Visit (INDEPENDENT_AMBULATORY_CARE_PROVIDER_SITE_OTHER): Payer: Medicare Other | Admitting: Internal Medicine

## 2017-10-26 ENCOUNTER — Encounter: Payer: Self-pay | Admitting: Internal Medicine

## 2017-10-26 VITALS — BP 156/76 | HR 83 | Temp 97.4°F | Resp 15 | Ht 66.0 in | Wt 140.2 lb

## 2017-10-26 DIAGNOSIS — R5382 Chronic fatigue, unspecified: Secondary | ICD-10-CM

## 2017-10-26 DIAGNOSIS — R0602 Shortness of breath: Secondary | ICD-10-CM | POA: Diagnosis not present

## 2017-10-26 DIAGNOSIS — I10 Essential (primary) hypertension: Secondary | ICD-10-CM

## 2017-10-26 DIAGNOSIS — E119 Type 2 diabetes mellitus without complications: Secondary | ICD-10-CM | POA: Diagnosis not present

## 2017-10-26 LAB — POCT GLYCOSYLATED HEMOGLOBIN (HGB A1C): Hemoglobin A1C: 5.9

## 2017-10-26 MED ORDER — AMLODIPINE BESYLATE 2.5 MG PO TABS: 2.5000 mg | ORAL_TABLET | Freq: Every day | ORAL | 3 refills | Status: DC

## 2017-10-26 NOTE — Patient Instructions (Addendum)
I am  Recommending that you start taking 2.5 mg amlodipine daily at bedtime for your high blood pressure    am sending you to a pulmonary specialist to see if you have asthma    You have earned the privilege of NOT HAVING TO [place] !

## 2017-10-26 NOTE — Progress Notes (Signed)
Subjective:  Patient ID: Leah Holmes, female    DOB: 04-May-1927  Age: 84 y.o. MRN: [id number]  CC: The primary encounter diagnosis was Shortness of breath. Diagnoses of Diabetes mellitus without complication ([place]), Chronic fatigue, and Essential hypertension, benign were also pertinent to this visit.  HPI Leah Holmes presents for management of labile hypertension   Seen in ER last visit  For generalized weakness  And dyspnea malaise and tremors .  Chronic complaint of malaise persists as well .  Ruled out for AMI,  Rapid atrial fib.   Las noted hyponatremiA,  Mild and chronic,   No UTI  , stabel chest x ray (COPD) no CHF.    Has an AICD for management of atrial fib and SSS , inoperable CAD by 2011 cath    3 month follow up on diabetes.  Patient has multiplecomplaints today.  Patient is following a low glycemic index diet and taking glipizide 2.5 mg before dinner .  Fasting sugars have been under less than 120 most of the time and post prandials have been under 140 except on rare occasions. Patient is sedentary and not intentionally trying to lose weight .  Patient has had an eye exam in the last 12 months and checks feet regularly for signs of infection.  Patient does not walk barefoot outside,  And denies an numbness tingling or burning in feet. Patient is up to date on all recommended vaccinations  Outpatient Medications Prior to Visit  Medication Sig Dispense Refill  . aspirin EC 81 MG tablet Take 81 mg by mouth daily. In am.    . Cholecalciferol (VITAMIN D3) 1000 UNITS CAPS Take 1,000 Units by mouth daily. In am.    . docusate sodium (COLACE) 50 MG capsule Take 50-100 mg by mouth daily as needed for mild constipation.     Marland Kitchen glipiZIDE (GLUCOTROL) 5 MG tablet TAKE 1/2 (ONE-HALF) TABLET BY MOUTH ONCE DAILY BEFORE SUPPER 45 tablet 2  . glucose blood (ONE TOUCH ULTRA TEST) test strip USE ONE STRIP TO CHECK GLUCOSE TWICE DAILY 200 each 3  . hydrochlorothiazide (HYDRODIURIL) 25 MG tablet  Take 25 mg by mouth every morning.    . isosorbide mononitrate (IMDUR) 60 MG 24 hr tablet Take 60 mg by mouth 2 (two) times daily.    Marland Kitchen lisinopril (PRINIVIL,ZESTRIL) 20 MG tablet Take 1 tablet (20 mg total) by mouth 2 (two) times daily. 60 tablet 0  . meclizine (ANTIVERT) 25 MG tablet TAKE ONE TABLET BY MOUTH THREE TIMES DAILY AS NEEDED 90 tablet 1  . nitroGLYCERIN (NITROSTAT) 0.4 MG SL tablet Place 0.4 mg under the tongue every 5 (five) minutes as needed for chest pain.    Marland Kitchen omeprazole (PRILOSEC) 20 MG capsule TAKE 1 CAPSULE BY MOUTH ONCE DAILY 90 capsule 1  . ONETOUCH DELICA LANCETS 09O MISC USE 1  TO CHECK GLUCOSE TWICE DAILY 200 each 0  . propranolol (INDERAL) 10 MG tablet Take 10 mg by mouth daily.     . psyllium (METAMUCIL) 58.6 % packet Take 1 packet by mouth daily as needed (for constipation).     . warfarin (COUMADIN) 2 MG tablet Take 4 mg by mouth daily at 6 PM.     . amLODipine (NORVASC) 5 MG tablet TAKE ONE TABLET BY MOUTH ONCE DAILY 90 tablet 3  . ranolazine (RANEXA) 500 MG 12 hr tablet Take 1 tablet (500 mg total) by mouth 2 (two) times daily. (Patient not taking: Reported on 10/20/2017) 60 tablet 2  No facility-administered medications prior to visit.     Review of Systems;  Patient denies headache, fevers, malaise, unintentional weight loss, skin rash, eye pain, sinus congestion and sinus pain, sore throat, dysphagia,  hemoptysis , cough, dyspnea, wheezing, chest pain, palpitations, orthopnea, edema, abdominal pain, nausea, melena, diarrhea, constipation, flank pain, dysuria, hematuria, urinary  Frequency, nocturia, numbness, tingling, seizures,  Focal weakness, Loss of consciousness,  Tremor, insomnia, depression, anxiety, and suicidal ideation.      Objective:  BP (!) 156/76 (BP Location: Left Arm, Patient Position: Sitting, Cuff Size: Normal)   Pulse 83   Temp (!) 97.4 F (36.3 C) (Oral)   Resp 15   Ht 5\' 6"  (1.676 m)   Wt 140 lb 3.2 oz (63.6 kg)   SpO2 96%   BMI  22.63 kg/m   BP Readings from Last 3 Encounters:  10/26/17 (!) 156/76  10/20/17 (!) 155/73  08/07/17 139/68    Wt Readings from Last 3 Encounters:  10/26/17 140 lb 3.2 oz (63.6 kg)  10/20/17 137 lb (62.1 kg)  08/07/17 139 lb (63 kg)    General appearance: alert, cooperative and appears stated age Ears: normal TM's and external ear canals both ears Throat: lips, mucosa, and tongue normal; teeth and gums normal Neck: no adenopathy, no carotid bruit, supple, symmetrical, trachea midline and thyroid not enlarged, symmetric, no tenderness/mass/nodules Back: symmetric, no curvature. ROM normal. No CVA tenderness. Lungs: clear to auscultation bilaterally Heart: regular rate and rhythm, S1, S2 normal, no murmur, click, rub or gallop Abdomen: soft, non-tender; bowel sounds normal; no masses,  no organomegaly Pulses: 2+ and symmetric Skin: Skin color, texture, turgor normal. No rashes or lesions Lymph nodes: Cervical, supraclavicular, and axillary nodes normal.  Lab Results  Component Value Date   HGBA1C 5.9 10/26/2017   HGBA1C 5.9 04/14/2017   HGBA1C 6.9 (H) 10/13/2016    Lab Results  Component Value Date   CREATININE 0.60 10/20/2017   CREATININE 0.61 04/14/2017   CREATININE 0.65 10/23/2016    Lab Results  Component Value Date   WBC 6.1 10/20/2017   HGB 14.0 10/20/2017   HCT 41.2 10/20/2017   PLT 227 10/20/2017   GLUCOSE 149 (H) 10/20/2017   CHOL 204 (H) 10/30/2015   TRIG 94.0 10/30/2015   HDL 57.80 10/30/2015   LDLDIRECT 116.0 10/30/2015   LDLCALC 128 (H) 10/30/2015   ALT 17 04/14/2017   AST 20 04/14/2017   NA 133 (L) 10/20/2017   K 4.1 10/20/2017   CL 97 (L) 10/20/2017   CREATININE 0.60 10/20/2017   BUN 13 10/20/2017   CO2 30 10/20/2017   TSH 0.82 10/13/2016   INR 2.10 10/20/2017   HGBA1C 5.9 10/26/2017   MICROALBUR <0.7 04/14/2017    Dg Chest 2 View  Result Date: 10/20/2017 CLINICAL DATA:  Shortness of breath and weakness. EXAM: CHEST - 2 VIEW COMPARISON:   12/17/2016 FINDINGS: Left chest wall AICD is in unchanged position. Unchanged bilateral chronic interstitial prominence and hyperexpansion of the lungs. No focal consolidation. No pleural effusion or pneumothorax. IMPRESSION: COPD without acute airspace disease. Electronically Signed   By: Ulyses Jarred M.D.   On: 10/20/2017 19:24    Assessment & Plan:   Problem List Items Addressed This Visit    Fatigue    Given her persistent symptoms of fatigue/malaise and recent chest x ray showing hyperexpansion, will refer to Pulmonology for evaluation to rule out asthma       Diabetes mellitus without complication ([place])    Currently  well-controlled on current medications .  hemoglobin A1c is at goal of less than 7.0 . Patient is reminded to schedule an annual eye exam and foot exam is normal today. Patient has no microalbuminuria. Patient is tolerating statin therapy for CAD risk reduction and on ACE/ARB for renal protection and hypertension   Lab Results  Component Value Date   MICROALBUR <0.7 04/14/2017   Lab Results  Component Value Date   HGBA1C 5.9 10/26/2017         Relevant Orders   POCT HgB A1C (Completed)   Essential hypertension, benign    Adding 2.5 mg amlodipine at bedtime       Relevant Medications   amLODipine (NORVASC) 2.5 MG tablet    Other Visit Diagnoses    Shortness of breath    -  Primary   Relevant Orders   Ambulatory referral to Pulmonology    A total of 25 minutes of face to face time was spent with patient more than half of which was spent in counselling about the above mentioned conditions  and coordination of care   I have discontinued Eyvette Cordon "[place] Janelle Coplen"'s amLODipine and ranolazine. I am also having her start on amLODipine. Additionally, I am having her maintain her nitroGLYCERIN, aspirin EC, warfarin, psyllium, Vitamin D3, isosorbide mononitrate, hydrochlorothiazide, docusate sodium, propranolol, meclizine, glucose blood, ONETOUCH DELICA  LANCETS 71I, omeprazole, lisinopril, and glipiZIDE.  Meds ordered this encounter  Medications  . amLODipine (NORVASC) 2.5 MG tablet    Sig: Take 1 tablet (2.5 mg total) by mouth daily.    Dispense:  90 tablet    Refill:  3    In the evening    Medications Discontinued During This Encounter  Medication Reason  . ranolazine (RANEXA) 500 MG 12 hr tablet Discontinued by provider  . amLODipine (NORVASC) 5 MG tablet     Follow-up: Return in about 6 months (around 04/28/2018) for follow up diabetes, hy\tn.   Crecencio Mc, MD

## 2017-10-27 DIAGNOSIS — R531 Weakness: Secondary | ICD-10-CM | POA: Insufficient documentation

## 2017-10-27 DIAGNOSIS — R5383 Other fatigue: Secondary | ICD-10-CM | POA: Insufficient documentation

## 2017-10-27 NOTE — Assessment & Plan Note (Signed)
Currently well-controlled on current medications .  hemoglobin A1c is at goal of less than 7.0 . Patient is reminded to schedule an annual eye exam and foot exam is normal today. Patient has no microalbuminuria. Patient is tolerating statin therapy for CAD risk reduction and on ACE/ARB for renal protection and hypertension   Lab Results  Component Value Date   MICROALBUR <0.7 04/14/2017   Lab Results  Component Value Date   HGBA1C 5.9 10/26/2017

## 2017-10-27 NOTE — Assessment & Plan Note (Signed)
Given her persistent symptoms of fatigue/malaise and recent chest x ray showing hyperexpansion, will refer to Pulmonology for evaluation to rule out asthma

## 2017-10-28 NOTE — Assessment & Plan Note (Signed)
Adding 2.5 mg amlodipine at bedtime

## 2017-11-12 ENCOUNTER — Other Ambulatory Visit: Payer: Self-pay | Admitting: Internal Medicine

## 2017-11-16 NOTE — Progress Notes (Signed)
[facility] Consultation      Assessment and Plan:  Chronic bronchitis.  - Patient has chronic bronchitis, with emphysema changes seen on chest x-ray.  She appears to be minimally symptomatic from this, her main symptoms appear to be excess mucus production from chronic bronchitis. - She is prescribed an albuterol inhaler that she can take periodically as needed for symptoms of dyspnea or excess mucus production. -I spoke with the patient's daughter and have asked that she call us if she has a flareup with increasing dyspnea or increasing mucus production so that we can see her call in a prescription as needed.  GERD. - GERD symptoms, which have come on recently and appeared to coincide with increasing chronic bronchitis symptoms. --Discussed that GERD can worsen respiratory problems, therefore I recommended that she stay on her omeprazole.  Meds ordered this encounter  Medications  . albuterol (PROAIR HFA) 108 (90 Base) MCG/ACT inhaler    Sig: Inhale 1-2 puffs into the lungs every 4 (four) hours as needed for wheezing or shortness of breath.    Dispense:  1 Inhaler    Refill:  5   Return if symptoms worsen or fail to improve.    Date: 11/17/2017  MRN# [id number] Leah Holmes 06-05-1927   Leah Holmes is a 84 y.o. old female seen in consultation for chief complaint of:    Chief Complaint  Patient presents with  . Consult    ref by Derrel Nip for SOB:   . Shortness of Breath    at rest and activity:  . Cough    prod at times: chest tightness    HPI:   The patient is present with her daughter who gives some of the history. She is here mostly because she has a lot of mucus in her throat, this is on and off. When she lays down at night she has trouble breathing because of excess mucus. The problems has been there for about a month.  She has occasional dyspnea. She lives with daughter , she does laundry, she makes the bed, she walks with a cane because has a  cane.  She does not limited by breathing, she does have tremors and some weakness which limits her activity. She has never smoked, her husband smoked.  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  She takes no inhalers.  She does have gerd and takes omeprezole which she only restart a nights ago.  She has 1 dog, none in bedroom.   **CBC with diff; max eos 200.  **Imaging personally reviewed, chest x-ray 10/20/2017; hyperinflation consistent with emphysema.   PMHX:   Past Medical History:  Diagnosis Date  . Atrial fibrillation ([place]) 2012  . CHF (congestive heart failure) ([place])   . Colon polyp 2003  . Coronary artery disease   . Diabetes mellitus without complication ([place])   . GERD (gastroesophageal reflux disease)   . Hyperlipidemia   . Hypertension   . Irritable bowel syndrome   . Polyp of colon   . PONV (postoperative nausea and vomiting)    with ether, not recently  . Ulcer    Surgical Hx:  Past Surgical History:  Procedure Laterality Date  . ABDOMINAL ADHESION SURGERY    . ABDOMINAL HYSTERECTOMY    . APPENDECTOMY    . BREAST SURGERY Right    biopsy   . CESAREAN SECTION     x 2  . CHOLECYSTECTOMY  03-2012  . COLONOSCOPY  2003   Dr Theodosia Paling in  [place]  . fibroid tumor    . PACEMAKER INSERTION N/A 01/28/2016   Procedure: INSERTION PACEMAKER;  Surgeon: Isaias Cowman, MD;  Location: [facility] ORS;  Service: Cardiovascular;  Laterality: N/A;  . UPPER GI ENDOSCOPY  2003   Dr Theodosia Paling in [place]   Family Hx:  Family History  Problem Relation Age of Onset  . Cancer Sister 48       breast   Social Hx:   Social History   Tobacco Use  . Smoking status: Never Smoker  . Smokeless tobacco: Former Systems developer    Types: Snuff  . Tobacco comment: occasionally  Substance Use Topics  . Alcohol use: No  . Drug use: No   Medication:    Current Outpatient Medications:  .  amLODipine (NORVASC) 2.5 MG tablet, Take 1 tablet (2.5 mg total) by mouth daily., Disp: 90 tablet, Rfl: 3 .   aspirin EC 81 MG tablet, Take 81 mg by mouth daily. In am., Disp: , Rfl:  .  Cholecalciferol (VITAMIN D3) 1000 UNITS CAPS, Take 1,000 Units by mouth daily. In am., Disp: , Rfl:  .  docusate sodium (COLACE) 50 MG capsule, Take 50-100 mg by mouth daily as needed for mild constipation. , Disp: , Rfl:  .  glipiZIDE (GLUCOTROL) 5 MG tablet, TAKE 1/2 (ONE-HALF) TABLET BY MOUTH ONCE DAILY BEFORE SUPPER, Disp: 45 tablet, Rfl: 2 .  glucose blood (ONE TOUCH ULTRA TEST) test strip, USE ONE STRIP TO CHECK GLUCOSE TWICE DAILY, Disp: 200 each, Rfl: 3 .  hydrochlorothiazide (HYDRODIURIL) 25 MG tablet, Take 25 mg by mouth every morning., Disp: , Rfl:  .  isosorbide mononitrate (IMDUR) 60 MG 24 hr tablet, Take 60 mg by mouth 2 (two) times daily., Disp: , Rfl:  .  lisinopril (PRINIVIL,ZESTRIL) 20 MG tablet, Take 1 tablet (20 mg total) by mouth 2 (two) times daily., Disp: 60 tablet, Rfl: 0 .  meclizine (ANTIVERT) 25 MG tablet, TAKE 1 TABLET BY MOUTH THREE TIMES DAILY AS NEEDED, Disp: 90 tablet, Rfl: 1 .  nitroGLYCERIN (NITROSTAT) 0.4 MG SL tablet, Place 0.4 mg under the tongue every 5 (five) minutes as needed for chest pain., Disp: , Rfl:  .  omeprazole (PRILOSEC) 20 MG capsule, TAKE 1 CAPSULE BY MOUTH ONCE DAILY, Disp: 90 capsule, Rfl: 1 .  ONETOUCH DELICA LANCETS 38H MISC, USE 1  TO CHECK GLUCOSE TWICE DAILY, Disp: 200 each, Rfl: 0 .  propranolol (INDERAL) 10 MG tablet, Take 10 mg by mouth daily. , Disp: , Rfl:  .  psyllium (METAMUCIL) 58.6 % packet, Take 1 packet by mouth daily as needed (for constipation). , Disp: , Rfl:  .  warfarin (COUMADIN) 2 MG tablet, Take 4 mg by mouth daily at 6 PM. , Disp: , Rfl:    Allergies:  Codeine; Penicillins; Atorvastatin; Clonidine derivatives; Gabapentin; Losartan; Morphine; Morphine and related; Reglan [metoclopramide]; and Hydralazine  Review of Systems: Gen:  Denies  fever, sweats, chills HEENT: Denies blurred vision, double vision. bleeds, sore throat Cvc:  No  dizziness, chest pain. Resp:   Denies shortness of breath Gi: Denies swallowing difficulty, stomach pain. Gu:  Denies bladder incontinence, burning urine Ext:   No Joint pain, stiffness. Skin: No skin rash,  hives  Endoc:  No polyuria, polydipsia. Psych: No depression, insomnia. Other:  All other systems were reviewed with the patient and were negative other that what is mentioned in the HPI.   Physical Examination:   VS: BP 118/60 (BP Location: Left Arm, Cuff Size: Normal)  Pulse 82   Ht 5\' 6"  (1.676 m)   Wt 139 lb (63 kg)   SpO2 97%   BMI 22.44 kg/m   General Appearance: No distress  Neuro:without focal findings,  speech normal,  HEENT: PERRLA, EOM intact.   Pulmonary: normal breath sounds, No wheezing.  CardiovascularNormal S1,S2.  No m/r/g.   Abdomen: Benign, Soft, non-tender. Renal:  No costovertebral tenderness  GU:  No performed at this time. Endoc: No evident thyromegaly, no signs of acromegaly. Skin:   warm, no rashes, no ecchymosis  Extremities: normal, no cyanosis, clubbing.  Other findings:    LABORATORY PANEL:   CBC No results for input(s): WBC, HGB, HCT, PLT in the last 168 hours. ------------------------------------------------------------------------------------------------------------------  Chemistries  No results for input(s): NA, K, CL, CO2, GLUCOSE, BUN, CREATININE, CALCIUM, MG, AST, ALT, ALKPHOS, BILITOT in the last 168 hours.  Invalid input(s): GFRCGP ------------------------------------------------------------------------------------------------------------------  Cardiac Enzymes No results for input(s): TROPONINI in the last 168 hours. ------------------------------------------------------------  RADIOLOGY:  No results found.     Thank  you for the consultation and for allowing [facility] to assist in the care of your patient. Our recommendations are noted above.  Please contact us if we can be of further  service.   Marda Stalker, MD.  Board Certified in Internal Medicine, Pulmonary Medicine, [place], and Sleep Medicine.  [facility] Office Number: [phone]  Patricia Pesa, M.D.  Merton Border, M.D  11/17/2017

## 2017-11-17 ENCOUNTER — Ambulatory Visit (INDEPENDENT_AMBULATORY_CARE_PROVIDER_SITE_OTHER): Payer: Medicare Other | Admitting: Internal Medicine

## 2017-11-17 ENCOUNTER — Encounter: Payer: Self-pay | Admitting: Internal Medicine

## 2017-11-17 VITALS — BP 118/60 | HR 82 | Ht 66.0 in | Wt 139.0 lb

## 2017-11-17 DIAGNOSIS — J42 Unspecified chronic bronchitis: Secondary | ICD-10-CM

## 2017-11-17 DIAGNOSIS — Z7901 Long term (current) use of anticoagulants: Secondary | ICD-10-CM | POA: Diagnosis not present

## 2017-11-17 MED ORDER — ALBUTEROL SULFATE HFA 108 (90 BASE) MCG/ACT IN AERS: 1.0000 | INHALATION_SPRAY | RESPIRATORY_TRACT | 5 refills | Status: DC | PRN

## 2017-11-17 NOTE — Patient Instructions (Addendum)
If you are having trouble breathing or excess mucus production, take 2 puffs of inhaler. May repeat in 1 hour of needed.  Continue taking omeprazole.

## 2017-11-19 ENCOUNTER — Other Ambulatory Visit: Payer: Self-pay | Admitting: Internal Medicine

## 2017-11-23 DIAGNOSIS — I495 Sick sinus syndrome: Secondary | ICD-10-CM | POA: Diagnosis not present

## 2017-12-20 DIAGNOSIS — Z7901 Long term (current) use of anticoagulants: Secondary | ICD-10-CM | POA: Diagnosis not present

## 2017-12-27 ENCOUNTER — Emergency Department
Admission: EM | Admit: 2017-12-27 | Discharge: 2017-12-27 | Disposition: A | Discharge status: Home / Self Care | Payer: Medicare Other | Attending: Emergency Medicine | Admitting: Emergency Medicine

## 2017-12-27 ENCOUNTER — Encounter: Payer: Self-pay | Admitting: Emergency Medicine

## 2017-12-27 ENCOUNTER — Other Ambulatory Visit: Payer: Self-pay

## 2017-12-27 ENCOUNTER — Emergency Department: Payer: Medicare Other

## 2017-12-27 ENCOUNTER — Encounter: Payer: Self-pay | Admitting: Internal Medicine

## 2017-12-27 DIAGNOSIS — Z7901 Long term (current) use of anticoagulants: Secondary | ICD-10-CM | POA: Insufficient documentation

## 2017-12-27 DIAGNOSIS — F411 Generalized anxiety disorder: Secondary | ICD-10-CM | POA: Insufficient documentation

## 2017-12-27 DIAGNOSIS — R0789 Other chest pain: Secondary | ICD-10-CM | POA: Diagnosis not present

## 2017-12-27 DIAGNOSIS — R079 Chest pain, unspecified: Secondary | ICD-10-CM

## 2017-12-27 DIAGNOSIS — I110 Hypertensive heart disease with heart failure: Secondary | ICD-10-CM | POA: Insufficient documentation

## 2017-12-27 DIAGNOSIS — F329 Major depressive disorder, single episode, unspecified: Secondary | ICD-10-CM | POA: Insufficient documentation

## 2017-12-27 DIAGNOSIS — E119 Type 2 diabetes mellitus without complications: Secondary | ICD-10-CM | POA: Diagnosis not present

## 2017-12-27 DIAGNOSIS — Z7984 Long term (current) use of oral hypoglycemic drugs: Secondary | ICD-10-CM | POA: Insufficient documentation

## 2017-12-27 DIAGNOSIS — Z79899 Other long term (current) drug therapy: Secondary | ICD-10-CM | POA: Insufficient documentation

## 2017-12-27 DIAGNOSIS — R0602 Shortness of breath: Secondary | ICD-10-CM | POA: Diagnosis not present

## 2017-12-27 DIAGNOSIS — Z7982 Long term (current) use of aspirin: Secondary | ICD-10-CM | POA: Diagnosis not present

## 2017-12-27 DIAGNOSIS — Z9049 Acquired absence of other specified parts of digestive tract: Secondary | ICD-10-CM | POA: Diagnosis not present

## 2017-12-27 DIAGNOSIS — I509 Heart failure, unspecified: Secondary | ICD-10-CM | POA: Insufficient documentation

## 2017-12-27 DIAGNOSIS — R2681 Unsteadiness on feet: Secondary | ICD-10-CM

## 2017-12-27 DIAGNOSIS — I2510 Atherosclerotic heart disease of native coronary artery without angina pectoris: Secondary | ICD-10-CM | POA: Diagnosis not present

## 2017-12-27 DIAGNOSIS — Z950 Presence of cardiac pacemaker: Secondary | ICD-10-CM | POA: Insufficient documentation

## 2017-12-27 LAB — TROPONIN I
Troponin I: <0.03 ng/mL (ref <0.03)
Troponin I: <0.03 ng/mL (ref <0.03)

## 2017-12-27 LAB — COMPREHENSIVE METABOLIC PANEL
ALT: 17 U/L (ref 0–44)
ANION GAP: 8 (ref 5–15)
AST: 25 U/L (ref 15–41)
Albumin: 3.7 g/dL (ref 3.5–5.0)
Alkaline Phosphatase: 62 U/L (ref 38–126)
BUN: 11 mg/dL (ref 8–23)
CO2: 27 mmol/L (ref 22–32)
Calcium: 8.9 mg/dL (ref 8.9–10.3)
Chloride: 96 mmol/L — ABNORMAL LOW (ref 98–111)
Creatinine, Ser: 0.73 mg/dL (ref 0.44–1.00)
GFR calc Af Amer: >60 mL/min (ref >60)
GFR calc non Af Amer: >60 mL/min (ref >60)
GLUCOSE: 145 mg/dL — AB (ref 70–99)
POTASSIUM: 4 mmol/L (ref 3.5–5.1)
SODIUM: 131 mmol/L — AB (ref 135–145)
Total Bilirubin: 0.8 mg/dL (ref 0.3–1.2)
Total Protein: 6.7 g/dL (ref 6.5–8.1)

## 2017-12-27 LAB — CBC
HCT: 39.2 % (ref 35.0–47.0)
Hemoglobin: 14 g/dL (ref 12.0–16.0)
MCH: 33.1 pg (ref 26.0–34.0)
MCHC: 35.8 g/dL (ref 32.0–36.0)
MCV: 92.5 fL (ref 80.0–100.0)
PLATELETS: 229 10*3/uL (ref 150–440)
RBC: 4.23 MIL/uL (ref 3.80–5.20)
RDW: 13.5 % (ref 11.5–14.5)
WBC: 5 10*3/uL (ref 3.6–11.0)

## 2017-12-27 LAB — PROTIME-INR
INR: 2.18
Prothrombin Time: 24.1 seconds — ABNORMAL HIGH (ref 11.4–15.2)

## 2017-12-27 NOTE — ED Provider Notes (Signed)
[facility] Emergency Department Provider Note  ____________________________________________   I have reviewed the triage vital signs and the nursing notes.   HISTORY  Chief Complaint Chest Pain   History limited by: Not Limited   HPI Leah Holmes is a 84 y.o. female who presents to the emergency department today because of concern for chest pressure and tremors. The patient states that these symptoms have been present on and off for a while. The patient states that she came in today because the symptoms were worse. She denies any new symptoms. She has spoken to her cardiologist about the chest pressure in the past and has pills for it. She states that at the time of the examination her symptoms have improved. She denies any unusual ingestions last night.    Per medical record review patient has a history of atrial fibrillation.  Past Medical History:  Diagnosis Date  . Atrial fibrillation ([place]) 2012  . CHF (congestive heart failure) ([place])   . Colon polyp 2003  . Coronary artery disease   . Diabetes mellitus without complication ([place])   . GERD (gastroesophageal reflux disease)   . Hyperlipidemia   . Hypertension   . Irritable bowel syndrome   . Polyp of colon   . PONV (postoperative nausea and vomiting)    with ether, not recently  . Ulcer     Patient Active Problem List   Diagnosis Date Noted  . Fatigue 10/27/2017  . Intention tremor 04/15/2017  . Osteoporosis 03/21/2016  . Sick sinus syndrome ([place]) 01/28/2016  . Other malaise 11/01/2015  . Major depressive disorder, recurrent episode ([place]) 10/30/2015  . Constipation 07/27/2015  . Generalized anxiety disorder 04/02/2015  . Ischemic chest pain 01/08/2015  . Atrial fibrillation ([place]) 08/16/2014  . Medicare annual wellness visit, subsequent 05/23/2014  . Dry mouth 03/10/2014  . Low serum vitamin D 02/16/2014  . Dysphagia, unspecified(787.20) 09/20/2013  . Anal polyp 09/20/2013  . Personal  history of colonic polyps 08/31/2013  . CAD (coronary artery disease) 03/09/2013  . Diabetes mellitus without complication ([place]) 44/06/270  . Hyperlipidemia associated with type 2 diabetes mellitus ([place]) 03/09/2013  . Essential hypertension, benign 03/09/2013  . Nonulcer dyspepsia 03/09/2013    Past Surgical History:  Procedure Laterality Date  . ABDOMINAL ADHESION SURGERY    . ABDOMINAL HYSTERECTOMY    . APPENDECTOMY    . BREAST SURGERY Right    biopsy   . CESAREAN SECTION     x 2  . CHOLECYSTECTOMY  03-2012  . COLONOSCOPY  2003   Dr Theodosia Paling in [place]  . fibroid tumor    . PACEMAKER INSERTION N/A 01/28/2016   Procedure: INSERTION PACEMAKER;  Surgeon: Isaias Cowman, MD;  Location: [facility] ORS;  Service: Cardiovascular;  Laterality: N/A;  . UPPER GI ENDOSCOPY  2003   Dr Theodosia Paling in [place]    Prior to Admission medications   Medication Sig Start Date End Date Taking? Authorizing Provider  albuterol (PROAIR HFA) 108 (90 Base) MCG/ACT inhaler Inhale 1-2 puffs into the lungs every 4 (four) hours as needed for wheezing or shortness of breath. 11/17/17   Laverle Hobby, MD  amLODipine (NORVASC) 2.5 MG tablet Take 1 tablet (2.5 mg total) by mouth daily. 10/26/17   Crecencio Mc, MD  aspirin EC 81 MG tablet Take 81 mg by mouth daily. In am.    [provider]  Cholecalciferol (VITAMIN D3) 1000 UNITS CAPS Take 1,000 Units by mouth daily. In am.    [provider]  docusate sodium (COLACE) 50 MG capsule Take 50-100 mg by mouth daily as needed for mild constipation.     [provider]  glipiZIDE (GLUCOTROL) 5 MG tablet TAKE 1/2 (ONE-HALF) TABLET BY MOUTH ONCE DAILY BEFORE SUPPER 10/18/17   Crecencio Mc, MD  glucose blood (ONE TOUCH ULTRA TEST) test strip USE ONE STRIP TO CHECK GLUCOSE TWICE DAILY 01/29/17   Crecencio Mc, MD  hydrochlorothiazide (HYDRODIURIL) 25 MG tablet Take 25 mg by mouth every morning. 12/12/15   [provider]  isosorbide  mononitrate (IMDUR) 60 MG 24 hr tablet Take 60 mg by mouth 2 (two) times daily. 12/27/15   [provider]  lisinopril (PRINIVIL,ZESTRIL) 20 MG tablet TAKE 1 TABLET BY MOUTH TWICE DAILY 11/19/17   Crecencio Mc, MD  meclizine (ANTIVERT) 25 MG tablet TAKE 1 TABLET BY MOUTH THREE TIMES DAILY AS NEEDED 11/12/17   Crecencio Mc, MD  nitroGLYCERIN (NITROSTAT) 0.4 MG SL tablet Place 0.4 mg under the tongue every 5 (five) minutes as needed for chest pain.    [provider]  omeprazole (PRILOSEC) 20 MG capsule TAKE 1 CAPSULE BY MOUTH ONCE DAILY 08/16/17   Crecencio Mc, MD  [facility] DELICA LANCETS 23N MISC USE 1  TO CHECK GLUCOSE TWICE DAILY 01/29/17   Crecencio Mc, MD  propranolol (INDERAL) 10 MG tablet Take 10 mg by mouth daily.  09/10/16   [provider]  psyllium (METAMUCIL) 58.6 % packet Take 1 packet by mouth daily as needed (for constipation).     [provider]  warfarin (COUMADIN) 2 MG tablet Take 4 mg by mouth daily at 6 PM.     [provider]    Allergies Codeine; Penicillins; Atorvastatin; Clonidine derivatives; Gabapentin; Losartan; Morphine; Morphine and related; Reglan [metoclopramide]; and Hydralazine  Family History  Problem Relation Age of Onset  . Cancer Sister 11       breast    Social History Social History   Tobacco Use  . Smoking status: Never Smoker  . Smokeless tobacco: Former Systems developer    Types: Snuff  . Tobacco comment: occasionally  Substance Use Topics  . Alcohol use: No  . Drug use: No    Review of Systems Constitutional: No fever/chills Eyes: No visual changes. ENT: No sore throat. Cardiovascular: Positive for chest pressure.  Respiratory: Denies shortness of breath. Gastrointestinal: No abdominal pain.  No nausea, no vomiting.  No diarrhea.   Genitourinary: Negative for dysuria. Musculoskeletal: Negative for back pain. Skin: Negative for rash. Neurological: Positive for tremors.    ____________________________________________   PHYSICAL EXAM:  VITAL SIGNS: ED Triage Vitals  Enc Vitals Group     BP 12/27/17 1345 (!) 128/52     Pulse Rate 12/27/17 1345 62     Resp 12/27/17 1345 18     Temp 12/27/17 1345 98.4 F (36.9 C)     Temp Source 12/27/17 1345 Oral     SpO2 12/27/17 1345 97 %     Weight 12/27/17 1346 138 lb (62.6 kg)     Height 12/27/17 1346 5\' 6"  (1.676 m)     Head Circumference --      Peak Flow --      Pain Score 12/27/17 1345 7   Constitutional: Alert and oriented.  Eyes: Conjunctivae are normal.  ENT      Head: Normocephalic and atraumatic.      Nose: No congestion/rhinnorhea.      Mouth/Throat: Mucous membranes are moist.  Neck: No stridor. Hematological/Lymphatic/Immunilogical: No cervical lymphadenopathy. Cardiovascular: Normal rate, irregular rhythm.  No murmurs, rubs, or gallops.  Respiratory: Normal respiratory effort without tachypnea nor retractions. Breath sounds are clear and equal bilaterally. No wheezes/rales/rhonchi. Gastrointestinal: Soft and non tender. No rebound. No guarding.  Genitourinary: Deferred Musculoskeletal: Normal range of motion in all extremities. No lower extremity edema. Neurologic:  Normal speech and language. No gross focal neurologic deficits are appreciated.  Skin:  Skin is warm, dry and intact. No rash noted. Psychiatric: Mood and affect are normal. Speech and behavior are normal. Patient exhibits appropriate insight and judgment.  ____________________________________________    LABS (pertinent positives/negatives)  INR 2.18 CBC wbc 5.0, hgb 14.0, plt 229 Trop <0.03 CMP na 131, k 4.0, cl 96, glu 145, cr 0.73  ____________________________________________   EKG  I, Nance Pear, attending physician, personally viewed and interpreted this EKG  EKG Time: 1341 Rate: 66 Rhythm: atrial fibrillation with ventricular paced complexes Axis: left axis deviation Intervals: qtc 461 QRS:  LBBB ST changes: no st elevation Impression: abnormal ekg   ____________________________________________    RADIOLOGY  CXR No infiltrate or edema. No pneumothorax.    ____________________________________________   PROCEDURES  Procedures  ____________________________________________   INITIAL IMPRESSION / ASSESSMENT AND PLAN / ED COURSE  Pertinent labs & imaging results that were available during my care of the patient were reviewed by me and considered in my medical decision making (see chart for details).   Patient presented to the emergency department today because of concerns for chest pressure and some tremors.  States is been ongoing problems.  Slightly worse today.  No other new symptoms.  The patient stated she was feeling better at the time my examination.  Blood work without any obvious etiology.  Sodium was a little bit low however this is not a new finding for the patient.  Will discharge follow-up with primary care.  Also give neurology information given tremors.   ____________________________________________   FINAL CLINICAL IMPRESSION(S) / ED DIAGNOSES  Final diagnoses:  Nonspecific chest pain     Note: This dictation was prepared with Dragon dictation. Any transcriptional errors that result from this process are unintentional     Nance Pear, MD 12/27/17 1820

## 2017-12-27 NOTE — ED Notes (Signed)

## 2017-12-27 NOTE — ED Triage Notes (Signed)
Chest pressure since 7am this am with SOB. Resp do not appear labored. Able to speak in full sentences. States she feels she cannot take a deep breath. Denies cough.

## 2017-12-27 NOTE — Discharge Instructions (Signed)
Please seek medical attention for any high fevers, chest pain, shortness of breath, change in behavior, persistent vomiting, bloody stool or any other new or concerning symptoms.  

## 2017-12-29 NOTE — Telephone Encounter (Signed)
Spoke with [name], pt's daughter and the walked order has been fax to her, number provided, per daughter request.

## 2018-01-11 ENCOUNTER — Encounter: Payer: Self-pay | Admitting: Emergency Medicine

## 2018-01-11 ENCOUNTER — Emergency Department: Payer: Medicare Other

## 2018-01-11 ENCOUNTER — Emergency Department
Admission: EM | Admit: 2018-01-11 | Discharge: 2018-01-12 | Disposition: A | Discharge status: Home / Self Care | Payer: Medicare Other | Attending: Emergency Medicine | Admitting: Emergency Medicine

## 2018-01-11 ENCOUNTER — Other Ambulatory Visit: Payer: Self-pay

## 2018-01-11 DIAGNOSIS — Z87891 Personal history of nicotine dependence: Secondary | ICD-10-CM | POA: Diagnosis not present

## 2018-01-11 DIAGNOSIS — I2510 Atherosclerotic heart disease of native coronary artery without angina pectoris: Secondary | ICD-10-CM | POA: Diagnosis not present

## 2018-01-11 DIAGNOSIS — Z7984 Long term (current) use of oral hypoglycemic drugs: Secondary | ICD-10-CM | POA: Diagnosis not present

## 2018-01-11 DIAGNOSIS — Z950 Presence of cardiac pacemaker: Secondary | ICD-10-CM | POA: Diagnosis not present

## 2018-01-11 DIAGNOSIS — R1031 Right lower quadrant pain: Secondary | ICD-10-CM | POA: Diagnosis present

## 2018-01-11 DIAGNOSIS — I110 Hypertensive heart disease with heart failure: Secondary | ICD-10-CM | POA: Insufficient documentation

## 2018-01-11 DIAGNOSIS — R0781 Pleurodynia: Secondary | ICD-10-CM | POA: Diagnosis not present

## 2018-01-11 DIAGNOSIS — Z7901 Long term (current) use of anticoagulants: Secondary | ICD-10-CM | POA: Diagnosis not present

## 2018-01-11 DIAGNOSIS — E119 Type 2 diabetes mellitus without complications: Secondary | ICD-10-CM | POA: Insufficient documentation

## 2018-01-11 DIAGNOSIS — Z7982 Long term (current) use of aspirin: Secondary | ICD-10-CM | POA: Insufficient documentation

## 2018-01-11 DIAGNOSIS — I509 Heart failure, unspecified: Secondary | ICD-10-CM | POA: Insufficient documentation

## 2018-01-11 DIAGNOSIS — R109 Unspecified abdominal pain: Secondary | ICD-10-CM | POA: Diagnosis not present

## 2018-01-11 LAB — URINALYSIS, ROUTINE W REFLEX MICROSCOPIC
BILIRUBIN URINE: NEGATIVE
Bacteria, UA: NONE SEEN
GLUCOSE, UA: NEGATIVE mg/dL
Ketones, ur: NEGATIVE mg/dL
Leukocytes, UA: NEGATIVE
NITRITE: NEGATIVE
PH: 7 (ref 5.0–8.0)
Protein, ur: NEGATIVE mg/dL
Specific Gravity, Urine: 1.005 (ref 1.005–1.030)

## 2018-01-11 LAB — COMPREHENSIVE METABOLIC PANEL
ALT: 20 U/L (ref 0–44)
ANION GAP: 8 (ref 5–15)
AST: 25 U/L (ref 15–41)
Albumin: 4.2 g/dL (ref 3.5–5.0)
Alkaline Phosphatase: 65 U/L (ref 38–126)
BUN: 9 mg/dL (ref 8–23)
CALCIUM: 9.7 mg/dL (ref 8.9–10.3)
CO2: 27 mmol/L (ref 22–32)
Chloride: 97 mmol/L — ABNORMAL LOW (ref 98–111)
Creatinine, Ser: 0.55 mg/dL (ref 0.44–1.00)
Glucose, Bld: 109 mg/dL — ABNORMAL HIGH (ref 70–99)
Potassium: 3.4 mmol/L — ABNORMAL LOW (ref 3.5–5.1)
SODIUM: 132 mmol/L — AB (ref 135–145)
Total Bilirubin: 1 mg/dL (ref 0.3–1.2)
Total Protein: 7.1 g/dL (ref 6.5–8.1)

## 2018-01-11 LAB — CBC WITH DIFFERENTIAL/PLATELET
Basophils Absolute: 0 10*3/uL (ref 0–0.1)
Basophils Relative: 1 %
EOS ABS: 0.1 10*3/uL (ref 0–0.7)
EOS PCT: 2 %
HCT: 43.4 % (ref 35.0–47.0)
Hemoglobin: 14.9 g/dL (ref 12.0–16.0)
LYMPHS ABS: 1.9 10*3/uL (ref 1.0–3.6)
LYMPHS PCT: 35 %
MCH: 31.7 pg (ref 26.0–34.0)
MCHC: 34.2 g/dL (ref 32.0–36.0)
MCV: 92.7 fL (ref 80.0–100.0)
MONOS PCT: 13 %
Monocytes Absolute: 0.7 10*3/uL (ref 0.2–0.9)
Neutro Abs: 2.7 10*3/uL (ref 1.4–6.5)
Neutrophils Relative %: 49 %
PLATELETS: 225 10*3/uL (ref 150–440)
RBC: 4.69 MIL/uL (ref 3.80–5.20)
RDW: 13.6 % (ref 11.5–14.5)
WBC: 5.4 10*3/uL (ref 3.6–11.0)

## 2018-01-11 LAB — LIPASE, BLOOD: LIPASE: 38 U/L (ref 11–51)

## 2018-01-11 MED ORDER — ONDANSETRON HCL 4 MG/2ML IJ SOLN
4.0000 mg | INTRAMUSCULAR | Status: AC
  Administered 2018-01-11: 4 mg via INTRAVENOUS
  Filled 2018-01-11: qty 2

## 2018-01-11 MED ORDER — FENTANYL CITRATE (PF) 100 MCG/2ML IJ SOLN
25.0000 ug | Freq: Once | INTRAMUSCULAR | Status: AC
  Administered 2018-01-11: 25 ug via INTRAVENOUS
  Filled 2018-01-11: qty 2

## 2018-01-11 NOTE — ED Triage Notes (Signed)
Pt to triage via w/c, appears uncomfortable; pt reports right mid back pain radiating into right upper abd accomp by nausea; denies hx of same

## 2018-01-11 NOTE — ED Notes (Signed)
Patient transported to CT 

## 2018-01-11 NOTE — ED Notes (Addendum)
Pt states since about 11am today she has had right flank to mid back around to epigastric pain. Pt states she took tylenol without relief. Pt states hx of gallbladder removal. Pt denies burning with urination, N/V/D. Pt states the pain is sharp and sudden and reoccurring. Pt states she was doing house work and is not sure if she pulled something as well.

## 2018-01-11 NOTE — ED Provider Notes (Signed)
[facility] Emergency Department Provider Note  ____________________________________________   First MD Initiated Contact with Patient 01/11/18 2326     (approximate)  I have reviewed the triage vital signs and the nursing notes.   HISTORY  Chief Complaint Back Pain and Abdominal Pain    HPI Leah Holmes is a 84 y.o. female who presents by private vehicle with her daughter.  She is here for evaluation of acute onset right flank pain that radiates around to the front of her abdomen.  She says that she was doing housework today and it started while she was doing housework.  She was able to continue but has become severe, sharp, stabbing, and occasionally will feel like a cramping pain.  It is of high on her right flank.  That is a primary location but at times it does feel like it is rating around to the front.  She denies fever/chills, chest pain, nausea, vomiting, and dysuria.  She did not remember having had similar symptoms in the past but her review of the medical record indicates that she had a very similar presentation to the urgent care within the last 6 months with right-sided flank pain and rib tenderness.  She reports that touching the area and moving around makes it worse and rest makes a little bit better but it still since sharp pains through every now and then.  She has no history of trauma, no recent falls, no bruising or rash in the area.  Past Medical History:  Diagnosis Date  . Atrial fibrillation ([place]) 2012  . CHF (congestive heart failure) ([place])   . Colon polyp 2003  . Coronary artery disease   . Diabetes mellitus without complication ([place])   . GERD (gastroesophageal reflux disease)   . Hyperlipidemia   . Hypertension   . Irritable bowel syndrome   . Polyp of colon   . PONV (postoperative nausea and vomiting)    with ether, not recently  . Ulcer     Patient Active Problem List   Diagnosis Date Noted  . Fatigue 10/27/2017  .  Intention tremor 04/15/2017  . Osteoporosis 03/21/2016  . Sick sinus syndrome ([place]) 01/28/2016  . Other malaise 11/01/2015  . Major depressive disorder, recurrent episode ([place]) 10/30/2015  . Constipation 07/27/2015  . Generalized anxiety disorder 04/02/2015  . Ischemic chest pain 01/08/2015  . Atrial fibrillation ([place]) 08/16/2014  . Medicare annual wellness visit, subsequent 05/23/2014  . Dry mouth 03/10/2014  . Low serum vitamin D 02/16/2014  . Dysphagia, unspecified(787.20) 09/20/2013  . Anal polyp 09/20/2013  . Personal history of colonic polyps 08/31/2013  . CAD (coronary artery disease) 03/09/2013  . Diabetes mellitus without complication ([place]) 58/85/0277  . Hyperlipidemia associated with type 2 diabetes mellitus ([place]) 03/09/2013  . Essential hypertension, benign 03/09/2013  . Nonulcer dyspepsia 03/09/2013    Past Surgical History:  Procedure Laterality Date  . ABDOMINAL ADHESION SURGERY    . ABDOMINAL HYSTERECTOMY    . APPENDECTOMY    . BREAST SURGERY Right    biopsy   . CESAREAN SECTION     x 2  . CHOLECYSTECTOMY  03-2012  . COLONOSCOPY  2003   Dr Theodosia Paling in [place]  . fibroid tumor    . PACEMAKER INSERTION N/A 01/28/2016   Procedure: INSERTION PACEMAKER;  Surgeon: Isaias Cowman, MD;  Location: [facility] ORS;  Service: Cardiovascular;  Laterality: N/A;  . UPPER GI ENDOSCOPY  2003   Dr Theodosia Paling in [place]    Prior to Admission  medications   Medication Sig Start Date End Date Taking? Authorizing Provider  albuterol (PROAIR HFA) 108 (90 Base) MCG/ACT inhaler Inhale 1-2 puffs into the lungs every 4 (four) hours as needed for wheezing or shortness of breath. 11/17/17   Laverle Hobby, MD  amLODipine (NORVASC) 2.5 MG tablet Take 1 tablet (2.5 mg total) by mouth daily. 10/26/17   Crecencio Mc, MD  aspirin EC 81 MG tablet Take 81 mg by mouth daily. In am.    [provider]  Cholecalciferol (VITAMIN D3) 1000 UNITS CAPS Take 1,000 Units by mouth daily. In  am.    [provider]  docusate sodium (COLACE) 50 MG capsule Take 50-100 mg by mouth daily as needed for mild constipation.     [provider]  glipiZIDE (GLUCOTROL) 5 MG tablet TAKE 1/2 (ONE-HALF) TABLET BY MOUTH ONCE DAILY BEFORE SUPPER 10/18/17   Crecencio Mc, MD  glucose blood (ONE TOUCH ULTRA TEST) test strip USE ONE STRIP TO CHECK GLUCOSE TWICE DAILY 01/29/17   Crecencio Mc, MD  hydrochlorothiazide (HYDRODIURIL) 25 MG tablet Take 25 mg by mouth every morning. 12/12/15   [provider]  isosorbide mononitrate (IMDUR) 60 MG 24 hr tablet Take 60 mg by mouth 2 (two) times daily. 12/27/15   [provider]  lidocaine (LIDODERM) 5 % Place 1 patch onto the skin every 12 (twelve) hours. Remove & Discard patch within 12 hours or as directed by MD.  [place] Proud the patch off for 12 hours before applying a new one. 01/12/18 01/12/19  Hinda Kehr, MD  lisinopril (PRINIVIL,ZESTRIL) 20 MG tablet TAKE 1 TABLET BY MOUTH TWICE DAILY 11/19/17   Crecencio Mc, MD  meclizine (ANTIVERT) 25 MG tablet TAKE 1 TABLET BY MOUTH THREE TIMES DAILY AS NEEDED 11/12/17   Crecencio Mc, MD  nitroGLYCERIN (NITROSTAT) 0.4 MG SL tablet Place 0.4 mg under the tongue every 5 (five) minutes as needed for chest pain.    [provider]  omeprazole (PRILOSEC) 20 MG capsule TAKE 1 CAPSULE BY MOUTH ONCE DAILY 08/16/17   Crecencio Mc, MD  [facility] DELICA LANCETS 85Y MISC USE 1  TO CHECK GLUCOSE TWICE DAILY 01/29/17   Crecencio Mc, MD  propranolol (INDERAL) 10 MG tablet Take 10 mg by mouth daily.  09/10/16   [provider]  psyllium (METAMUCIL) 58.6 % packet Take 1 packet by mouth daily as needed (for constipation).     [provider]  warfarin (COUMADIN) 2 MG tablet Take 4 mg by mouth daily at 6 PM.     [provider]    Allergies Codeine; Penicillins; Atorvastatin; Clonidine derivatives; Gabapentin; Losartan; Morphine; Morphine and related; Reglan  [metoclopramide]; and Hydralazine  Family History  Problem Relation Age of Onset  . Cancer Sister 32       breast    Social History Social History   Tobacco Use  . Smoking status: Never Smoker  . Smokeless tobacco: Former Systems developer    Types: Snuff  . Tobacco comment: occasionally  Substance Use Topics  . Alcohol use: No  . Drug use: No    Review of Systems Constitutional: No fever/chills Eyes: No visual changes. ENT: No sore throat. Cardiovascular: Denies chest pain. Respiratory: Denies shortness of breath. Gastrointestinal: Some upper abdominal pain that is radiating around from the right flank.  No nausea, no vomiting.  No diarrhea.  No constipation. Genitourinary: Negative for dysuria. Musculoskeletal: Sharp stabbing pain in the right flank with occasional cramping that  sometimes radiates around to the front.  Negative for neck pain.   Integumentary: Negative for rash. Neurological: Negative for headaches, focal weakness or numbness.   ____________________________________________   PHYSICAL EXAM:  VITAL SIGNS: ED Triage Vitals  Enc Vitals Group     BP 01/11/18 2248 (!) 182/88     Pulse Rate 01/11/18 2248 68     Resp 01/11/18 2248 18     Temp 01/11/18 2248 (!) 97.4 F (36.3 C)     Temp Source 01/11/18 2248 Oral     SpO2 01/11/18 2248 100 %     Weight 01/11/18 2247 62.6 kg (138 lb)     Height 01/11/18 2247 1.676 m (5\' 6" )     Head Circumference --      Peak Flow --      Pain Score 01/11/18 2247 10     Pain Loc --      Pain Edu? --      Excl. in [place]? --     Constitutional: Elderly but alert and oriented.  The patient every now and then will groan and exclaim and reach around to the right flank as a pain has her but otherwise she is not in severe distress. Eyes: Conjunctivae are normal.  Head: Atraumatic. Nose: No congestion/rhinnorhea. Mouth/Throat: Mucous membranes are moist. Neck: No stridor.  No meningeal signs.   Cardiovascular: Normal rate, regular  rhythm. Good peripheral circulation. Grossly normal heart sounds. Respiratory: Normal respiratory effort.  No retractions. Lungs CTAB. Gastrointestinal: Soft and nontender to palpation of the anterior abdomen both upper and lower.  No epigastric or right upper quadrant tenderness with negative Murphy sign and no tenderness to palpation at McBurney's point. Musculoskeletal: Highly reproducible right CVA tenderness to percussion as well as even just tenderness to palpation of light touch in the right flank as well as up higher on the right lateral rib cage.  No deformities appreciated on palpation or visual inspection.  There is no erythema or any sort of rash or vesicular lesions. Neurologic:  Normal speech and language. No gross focal neurologic deficits are appreciated.  Skin:  Skin is warm, dry and intact. No rash noted. Psychiatric: Mood and affect are anxious and somewhat agitated but understandable under the circumstances. Speech and behavior are normal.  ____________________________________________   LABS (all labs ordered are listed, but only abnormal results are displayed)  Labs Reviewed  COMPREHENSIVE METABOLIC PANEL - Abnormal; Notable for the following components:      Result Value   Sodium 132 (*)    Potassium 3.4 (*)    Chloride 97 (*)    Glucose, Bld 109 (*)    All other components within normal limits  URINALYSIS, ROUTINE W REFLEX MICROSCOPIC - Abnormal; Notable for the following components:   Color, Urine STRAW (*)    APPearance CLEAR (*)    Hgb urine dipstick SMALL (*)    All other components within normal limits  CBC WITH DIFFERENTIAL/PLATELET  LIPASE, BLOOD   ____________________________________________  EKG  None - EKG not ordered by ED physician ____________________________________________  RADIOLOGY   ED MD interpretation:  No acute abnormalities on CT scan  Official radiology report(s):  Ct Renal Stone Study  Result Date: 01/12/2018 CLINICAL DATA:   Right flank pain. Right lower lateral rib tenderness. EXAM: CT ABDOMEN AND PELVIS WITHOUT CONTRAST TECHNIQUE: Multidetector CT imaging of the abdomen and pelvis was performed following the standard protocol without IV contrast. COMPARISON:  Abdominal CT 10/05/2014 FINDINGS: Lower chest: The lung bases are  clear. There are coronary artery calcifications. Pacemaker wires partially included. Visualized right ribs are intact without fracture or focal lesion. Hepatobiliary: Left lobe hepatic lesion on prior exam is faintly visualized. Additional lesions in the right lobe are not well seen in the absence of IV contrast. Clips in the gallbladder fossa postcholecystectomy. No biliary dilatation. Pancreas: No ductal dilatation or inflammation. Spleen: Normal in size without focal abnormality. Adrenals/Urinary Tract: Normal adrenal glands. Bilateral renal hilar low-density corresponds to parapelvic cysts as seen on prior CT. No hydronephrosis or ureteral dilatation. No urolithiasis. No perinephric edema. Simple cyst in the upper left kidney again seen measuring approximately 3.6 cm. Urinary bladder is physiologically distended, no bladder wall thickening. No bladder stone. Stomach/Bowel: Mild wall thickening at the gastroesophageal junction. Stomach is physiologically distended. No bowel wall thickening, obstruction or inflammatory change. Colonic diverticulosis is most prominent in the distal colon, no diverticulitis. Appendix not visualized, surgically absent per history. Vascular/Lymphatic: Dense aorta bi-iliac atherosclerosis. No aneurysm. No abdominopelvic adenopathy. Reproductive: Status post hysterectomy. No adnexal masses. Right ovary is quiescent. Left ovary not seen. Other: Small amount of fluid anterior to the sigmoid colon is unchanged from prior exam may be a peritoneal inclusion cyst. No ascites. No free air. Musculoskeletal: Bones are under mineralized with mild for age degenerative change in the spine. There  are no acute or suspicious osseous abnormalities. No focal right rib abnormality. IMPRESSION: 1. No renal stone or obstructive uropathy. No focal abnormality of included lower right ribs to explain rib tenderness. 2. No acute findings in the abdomen/pelvis. 3. Bilateral parapelvic cysts in both kidneys and cortical left renal cyst. 4. Colonic diverticulosis without diverticulitis. Aortic Atherosclerosis (ICD10-I70.0). Electronically Signed   By: Jeb Levering M.D.   On: 01/12/2018 00:22    ____________________________________________   PROCEDURES  Critical Care performed: No   Procedure(s) performed:   Procedures   ____________________________________________   INITIAL IMPRESSION / ASSESSMENT AND PLAN / ED COURSE  As part of my medical decision making, I reviewed the following data within the [place] History obtained from family, Nursing notes reviewed and incorporated, Labs reviewed , Old chart reviewed and Notes from prior ED visits    Differential diagnosis includes, but is not limited to, musculoskeletal pain/strain versus costochondritis, renal/ureteral colic, rib fracture or contusion, early shingles.  The patient's vital signs are reassuring other than hypertension.  She is afebrile and not tachycardic.  Lab work is pending at this time including urinalysis.  A review of the medical record reveals that she had a similar presentation in urgent care several months ago and she was diagnosed with musculoskeletal pain or costochondritis.  I feel like this is most consistent with her physical exam at this time given the highly reproducible tenderness to palpation as well as the intermittent pain that seems to be from muscle spasms.  However given her age and the location of the pain, I will proceed with a CT scan of the abdomen and pelvis renal study protocol to make sure she does not have any evidence of a ureteral stone.  She has an extensive list of allergies  including reportedly having an anaphylactic reaction to codeine.  She has asked multiple times for some for the pain so we will try fentanyl 25 mcg IV as well as Zofran 4 mg IV to see if this helps with the acute pain.  Given her age I do not want to administer benzodiazepines even though she is having some muscle spasm-like pain so I  will explore other options once the rest of the assessment is complete if her pain is not adequately controlled.  Clinical Course as of Jan 13 127  Wed Jan 12, 2018  0033 Lab work within normal limits, no evidence of UTI, and CT scan of the abdomen pelvis reveals no acute issues and no specific cause of her pain and discomfort.  I will reassess and discuss the appropriate pain management plan with the patient and her daughter.  CT Renal Stone Study [CF]  0114 Work-up has been unremarkable.   [CF]  0127 The patient feels better now but is still reporting some intermittent pain.  Given her numerous allergies to medications as well as her age, the patient and her daughter are comfortable with the plan for a Lidoderm patch and continue to use Tylenol at home.  They will follow-up with her PCP at the next available opportunity.  I gave my usual and customary return precautions.   [CF]  0128 Of note, even though the patient is still reporting some pain, she is eager to go home and has no reservations about doing so.   [CF]    Clinical Course User Index [CF] Hinda Kehr, MD    ____________________________________________  FINAL CLINICAL IMPRESSION(S) / ED DIAGNOSES  Final diagnoses:  Rib pain on right side  Acute right flank pain     MEDICATIONS GIVEN DURING THIS VISIT:  Medications  lidocaine (LIDODERM) 5 % 1 patch (1 patch Transdermal Patch Applied 01/12/18 0123)  fentaNYL (SUBLIMAZE) injection 25 mcg (25 mcg Intravenous Given 01/11/18 2334)  ondansetron (ZOFRAN) injection 4 mg (4 mg Intravenous Given 01/11/18 2333)     ED Discharge Orders        Ordered     lidocaine (LIDODERM) 5 %  Every 12 hours     01/12/18 0121       Note:  This document was prepared using Dragon voice recognition software and may include unintentional dictation errors.    Hinda Kehr, MD 01/12/18 [phone]

## 2018-01-12 DIAGNOSIS — R0781 Pleurodynia: Secondary | ICD-10-CM | POA: Diagnosis not present

## 2018-01-12 MED ORDER — LIDOCAINE 5 % EX PTCH: 1.0000 | MEDICATED_PATCH | Freq: Two times a day (BID) | CUTANEOUS | 0 refills | Status: DC

## 2018-01-12 MED ORDER — LIDOCAINE 5 % EX PTCH
MEDICATED_PATCH | CUTANEOUS | Status: AC
  Filled 2018-01-12: qty 1

## 2018-01-12 MED ORDER — LIDOCAINE 5 % EX PTCH
1.0000 | MEDICATED_PATCH | CUTANEOUS | Status: DC
  Administered 2018-01-12: 1 via TRANSDERMAL

## 2018-01-12 NOTE — ED Notes (Signed)

## 2018-01-12 NOTE — Discharge Instructions (Signed)

## 2018-01-14 ENCOUNTER — Other Ambulatory Visit: Payer: Self-pay

## 2018-01-18 DIAGNOSIS — Z7901 Long term (current) use of anticoagulants: Secondary | ICD-10-CM | POA: Diagnosis not present

## 2018-02-14 ENCOUNTER — Other Ambulatory Visit: Payer: Self-pay | Admitting: Internal Medicine

## 2018-02-17 DIAGNOSIS — Z7901 Long term (current) use of anticoagulants: Secondary | ICD-10-CM | POA: Diagnosis not present

## 2018-03-02 ENCOUNTER — Ambulatory Visit (INDEPENDENT_AMBULATORY_CARE_PROVIDER_SITE_OTHER): Payer: Medicare Other | Admitting: Internal Medicine

## 2018-03-02 ENCOUNTER — Other Ambulatory Visit: Payer: Self-pay | Admitting: Internal Medicine

## 2018-03-02 ENCOUNTER — Encounter: Payer: Self-pay | Admitting: Internal Medicine

## 2018-03-02 VITALS — BP 142/60 | HR 77 | Temp 97.9°F | Ht 66.0 in | Wt 138.4 lb

## 2018-03-02 DIAGNOSIS — R0602 Shortness of breath: Secondary | ICD-10-CM | POA: Diagnosis not present

## 2018-03-02 DIAGNOSIS — I208 Other forms of angina pectoris: Secondary | ICD-10-CM

## 2018-03-02 DIAGNOSIS — R251 Tremor, unspecified: Secondary | ICD-10-CM | POA: Diagnosis not present

## 2018-03-02 DIAGNOSIS — K5909 Other constipation: Secondary | ICD-10-CM | POA: Diagnosis not present

## 2018-03-02 DIAGNOSIS — E119 Type 2 diabetes mellitus without complications: Secondary | ICD-10-CM

## 2018-03-02 DIAGNOSIS — I10 Essential (primary) hypertension: Secondary | ICD-10-CM | POA: Diagnosis not present

## 2018-03-02 MED ORDER — ATENOLOL 25 MG PO TABS: 25.0000 mg | ORAL_TABLET | Freq: Every day | ORAL | 3 refills | Status: DC

## 2018-03-02 NOTE — Progress Notes (Signed)
Subjective:  Patient ID: Leah Holmes, female    DOB: Aug 12, 1926  Age: 84 y.o. MRN: [id number]  CC: The primary encounter diagnosis was Tremor. Diagnoses of Shortness of breath, Diabetes mellitus without complication ([place]), Essential hypertension, benign, Stable angina pectoris ([place]), and Other constipation were also pertinent to this visit.  HPI Leah Holmes presents for multiple complaints. Patient is very anxious today, and states " I feel terrible"   She continues to report recurrent episodes of Chest pain  short of breath , and had an ER visit in July for same .  Labs , EKG and chest x ray from the ER visit were reviewed with patient and daughter today,  All were normal     She reports that her Tremor has become worse.  She reports that her blood pressure has been very labile and elevated at times to  systolics as high as 951,  Accompanied by headaches.  She thinks she may have an allergy to amlodipoine because she developed pruritus and stopped it and the itching resolved.  However she resumed amlodipine several days ago bc of the bp readings and the itching has not returned.   She reports that she is still CONSTIPATED    Last BM was 2 days ago,  small caliber solid stools  And her hermorrhoids are irritated but not bleeding .  She has been  using preparation h      Outpatient Medications Prior to Visit  Medication Sig Dispense Refill  . albuterol (PROAIR HFA) 108 (90 Base) MCG/ACT inhaler Inhale 1-2 puffs into the lungs every 4 (four) hours as needed for wheezing or shortness of breath. 1 Inhaler 5  . amLODipine (NORVASC) 2.5 MG tablet Take 1 tablet (2.5 mg total) by mouth daily. 90 tablet 3  . aspirin EC 81 MG tablet Take 81 mg by mouth daily. In am.    . Cholecalciferol (VITAMIN D3) 1000 UNITS CAPS Take 1,000 Units by mouth daily. In am.    . docusate sodium (COLACE) 50 MG capsule Take 50-100 mg by mouth daily as needed for mild constipation.     Marland Kitchen glipiZIDE (GLUCOTROL) 5 MG  tablet TAKE 1/2 (ONE-HALF) TABLET BY MOUTH ONCE DAILY BEFORE SUPPER 45 tablet 2  . glucose blood (ONE TOUCH ULTRA TEST) test strip USE ONE STRIP TO CHECK GLUCOSE TWICE DAILY 200 each 3  . glucose blood (ONE TOUCH ULTRA TEST) test strip USE 1 STRIP TO CHECK GLUCOSE TWICE DAILY 100 each 2  . hydrochlorothiazide (HYDRODIURIL) 25 MG tablet Take 25 mg by mouth every morning.    . isosorbide mononitrate (IMDUR) 60 MG 24 hr tablet Take 60 mg by mouth 2 (two) times daily.    Marland Kitchen lidocaine (LIDODERM) 5 % Place 1 patch onto the skin every 12 (twelve) hours. Remove & Discard patch within 12 hours or as directed by MD.  Pershing Proud the patch off for 12 hours before applying a new one. 10 patch 0  . lisinopril (PRINIVIL,ZESTRIL) 20 MG tablet TAKE 1 TABLET BY MOUTH TWICE DAILY 180 tablet 1  . meclizine (ANTIVERT) 25 MG tablet TAKE 1 TABLET BY MOUTH THREE TIMES DAILY AS NEEDED 90 tablet 1  . nitroGLYCERIN (NITROSTAT) 0.4 MG SL tablet Place 0.4 mg under the tongue every 5 (five) minutes as needed for chest pain.    Marland Kitchen omeprazole (PRILOSEC) 20 MG capsule TAKE 1 CAPSULE BY MOUTH ONCE DAILY 90 capsule 1  . ONETOUCH DELICA LANCETS 88C MISC USE 1  TO CHECK GLUCOSE TWICE DAILY  200 each 0  . psyllium (METAMUCIL) 58.6 % packet Take 1 packet by mouth daily as needed (for constipation).     . warfarin (COUMADIN) 2 MG tablet Take 4 mg by mouth daily at 6 PM.     . propranolol (INDERAL) 10 MG tablet Take 10 mg by mouth daily.      No facility-administered medications prior to visit.     Review of Systems;  Patient denies , fevers, malaise, unintentional weight loss, skin rash, eye pain, sinus congestion and sinus pain, sore throat, dysphagia,  hemoptysis , cough, dyspnea, wheezing,, palpitations, orthopnea, edema, abdominal pain, nausea, melena, diarrhea,  flank pain, dysuria, hematuria, urinary  Frequency, nocturia, numbness, tingling, seizures,  Focal weakness, Loss of consciousness,   insomnia, depression, anxiety, and suicidal  ideation.      Objective:  BP (!) 142/60   Pulse 77   Temp 97.9 F (36.6 C) (Oral)   Ht 5\' 6"  (1.676 m)   Wt 138 lb 6.4 oz (62.8 kg)   SpO2 94%   BMI 22.34 kg/m   BP Readings from Last 3 Encounters:  03/02/18 (!) 142/60  01/12/18 (!) 148/68  12/27/17 (!) 177/95    Wt Readings from Last 3 Encounters:  03/02/18 138 lb 6.4 oz (62.8 kg)  01/11/18 138 lb (62.6 kg)  12/27/17 138 lb (62.6 kg)    General appearance: alert, anxious, cooperative and appears stated age Ears: normal TM's and external ear canals both ears Throat: lips, mucosa, and tongue normal; teeth and gums normal Neck: no adenopathy, no carotid bruit, supple, symmetrical, trachea midline and thyroid not enlarged, symmetric, no tenderness/mass/nodules Back: symmetric, no curvature. ROM normal. No CVA tenderness. Lungs: clear to auscultation bilaterally Heart: regular rate and rhythm, S1, S2 normal, no murmur, click, rub or gallop Abdomen: soft, non-tender; bowel sounds normal; no masses,  no organomegaly Pulses: 2+ and symmetric Skin: Skin color, texture, turgor normal. No rashes or lesions Lymph nodes: Cervical, supraclavicular, and axillary nodes normal.  Lab Results  Component Value Date   HGBA1C 6.1 03/02/2018   HGBA1C 5.9 10/26/2017   HGBA1C 5.9 04/14/2017    Lab Results  Component Value Date   CREATININE 0.66 03/02/2018   CREATININE 0.55 01/11/2018   CREATININE 0.73 12/27/2017    Lab Results  Component Value Date   WBC 5.4 01/11/2018   HGB 14.9 01/11/2018   HCT 43.4 01/11/2018   PLT 225 01/11/2018   GLUCOSE 133 (H) 03/02/2018   CHOL 204 (H) 10/30/2015   TRIG 94.0 10/30/2015   HDL 57.80 10/30/2015   LDLDIRECT 116.0 10/30/2015   LDLCALC 128 (H) 10/30/2015   ALT 19 03/02/2018   AST 23 03/02/2018   NA 132 (L) 03/02/2018   K 3.8 03/02/2018   CL 95 (L) 03/02/2018   CREATININE 0.66 03/02/2018   BUN 9 03/02/2018   CO2 28 03/02/2018   TSH 0.64 03/02/2018   INR 2.18 12/27/2017   HGBA1C 6.1  03/02/2018   MICROALBUR <0.7 04/14/2017    Ct Renal Stone Study  Result Date: 01/12/2018 CLINICAL DATA:  Right flank pain. Right lower lateral rib tenderness. EXAM: CT ABDOMEN AND PELVIS WITHOUT CONTRAST TECHNIQUE: Multidetector CT imaging of the abdomen and pelvis was performed following the standard protocol without IV contrast. COMPARISON:  Abdominal CT 10/05/2014 FINDINGS: Lower chest: The lung bases are clear. There are coronary artery calcifications. Pacemaker wires partially included. Visualized right ribs are intact without fracture or focal lesion. Hepatobiliary: Left lobe hepatic lesion on prior exam is faintly  visualized. Additional lesions in the right lobe are not well seen in the absence of IV contrast. Clips in the gallbladder fossa postcholecystectomy. No biliary dilatation. Pancreas: No ductal dilatation or inflammation. Spleen: Normal in size without focal abnormality. Adrenals/Urinary Tract: Normal adrenal glands. Bilateral renal hilar low-density corresponds to parapelvic cysts as seen on prior CT. No hydronephrosis or ureteral dilatation. No urolithiasis. No perinephric edema. Simple cyst in the upper left kidney again seen measuring approximately 3.6 cm. Urinary bladder is physiologically distended, no bladder wall thickening. No bladder stone. Stomach/Bowel: Mild wall thickening at the gastroesophageal junction. Stomach is physiologically distended. No bowel wall thickening, obstruction or inflammatory change. Colonic diverticulosis is most prominent in the distal colon, no diverticulitis. Appendix not visualized, surgically absent per history. Vascular/Lymphatic: Dense aorta bi-iliac atherosclerosis. No aneurysm. No abdominopelvic adenopathy. Reproductive: Status post hysterectomy. No adnexal masses. Right ovary is quiescent. Left ovary not seen. Other: Small amount of fluid anterior to the sigmoid colon is unchanged from prior exam may be a peritoneal inclusion cyst. No ascites. No  free air. Musculoskeletal: Bones are under mineralized with mild for age degenerative change in the spine. There are no acute or suspicious osseous abnormalities. No focal right rib abnormality. IMPRESSION: 1. No renal stone or obstructive uropathy. No focal abnormality of included lower right ribs to explain rib tenderness. 2. No acute findings in the abdomen/pelvis. 3. Bilateral parapelvic cysts in both kidneys and cortical left renal cyst. 4. Colonic diverticulosis without diverticulitis. Aortic Atherosclerosis (ICD10-I70.0). Electronically Signed   By: Jeb Levering M.D.   On: 01/12/2018 00:22    Assessment & Plan:   Problem List Items Addressed This Visit    Constipation    Adding stool softener and BFL daily       Diabetes mellitus without complication ([place])    She continues to worry excessively about her glycemic control  Which is excellent on current dose of glipizide based on assessment of A1c.  reassurance given.  No changes today  Lab Results  Component Value Date   HGBA1C 6.1 03/02/2018         Relevant Orders   Hemoglobin A1c (Completed)   Comprehensive metabolic panel (Completed)   Essential hypertension, benign    elevations appear to be aggravated by untreated/unacknowledged  anxiety.  Hme readings reviewed. Given several readings that are quite normal (systolic < 397) I am concerned that overly aggressive treatment will lead to orthostasis and increased risk of falling ,  And she is anticoagulated with coumadin.  Given her concurrent tremor,  We will Stop propranolol,  Start atenolol 25 mg daily  In the afternoon and titrate dose up to bid if tolerated.  Continue lisinopril, amlodipine, hctz  and Imdur   Her hypontremia is mild and stable.she may stop the amlodipine if the itching returns.   Daughter is an Therapist, sports and will coordinate with me.    Lab Results  Component Value Date   CREATININE 0.66 03/02/2018   Lab Results  Component Value Date   NA 132 (L) 03/02/2018    K 3.8 03/02/2018   CL 95 (L) 03/02/2018   CO2 28 03/02/2018         Relevant Medications   atenolol (TENORMIN) 25 MG tablet   Ischemic chest pain    BNP was normal today and recent ER evaluation noted no ischemic EKGS changes or positive cardiac cardiac markers.  Encouraged to use ntg prn ad follow up with Dr [name]        Other  Visit Diagnoses    Tremor    -  Primary   Relevant Orders   TSH (Completed)   Shortness of breath       Relevant Orders   B Nat Peptide (Completed)     A total of 40 minutes was spent with patient more than half of which was spent in counseling patient on the above mentioned issues , reviewing and explaining recent labs and imaging studies done, and coordination of care.  I have discontinued Yeimi Debnam "[place] Jamie Closson"'s propranolol. I am also having her start on atenolol. Additionally, I am having her maintain her nitroGLYCERIN, aspirin EC, warfarin, psyllium, Vitamin D3, isosorbide mononitrate, hydrochlorothiazide, docusate sodium, glucose blood, ONETOUCH DELICA LANCETS 43X, omeprazole, glipiZIDE, amLODipine, meclizine, albuterol, lisinopril, lidocaine, and glucose blood.  Meds ordered this encounter  Medications  . atenolol (TENORMIN) 25 MG tablet    Sig: Take 1 tablet (25 mg total) by mouth daily. In the afternoon    Dispense:  90 tablet    Refill:  3    Medications Discontinued During This Encounter  Medication Reason  . propranolol (INDERAL) 10 MG tablet     Follow-up: No follow-ups on file.   Crecencio Mc, MD

## 2018-03-02 NOTE — Patient Instructions (Addendum)
You can add Nupercainal along with Preparation H  Start using metamucil and a stool softener   every night   Continue amlodipine 2.5 mg daily in the evening  Take the atenolol 25 mg in the morninng .  Stop the propranolol.  Do not increase your glipizide  ,  Continue 1/2 tablet before your evening meal.   I want you to use tylenol for you headaches   You can use it daily up to 2000 mg daily  (that's 4 tablets daily if you need it)    If your itching returns, we can increase the atenolol and stop the amlodipine

## 2018-03-02 NOTE — Progress Notes (Signed)
Pre visit review using our clinic review tool, if applicable. No additional management support is needed unless otherwise documented below in the visit note. 

## 2018-03-03 LAB — COMPREHENSIVE METABOLIC PANEL
ALBUMIN: 4.2 g/dL (ref 3.5–5.2)
ALT: 19 U/L (ref 0–35)
AST: 23 U/L (ref 0–37)
Alkaline Phosphatase: 62 U/L (ref 39–117)
BUN: 9 mg/dL (ref 6–23)
CHLORIDE: 95 meq/L — AB (ref 96–112)
CO2: 28 mEq/L (ref 19–32)
Calcium: 9.6 mg/dL (ref 8.4–10.5)
Creatinine, Ser: 0.66 mg/dL (ref 0.40–1.20)
GFR: 89.52 mL/min (ref >60.00)
Glucose, Bld: 133 mg/dL — ABNORMAL HIGH (ref 70–99)
POTASSIUM: 3.8 meq/L (ref 3.5–5.1)
SODIUM: 132 meq/L — AB (ref 135–145)
Total Bilirubin: 0.6 mg/dL (ref 0.2–1.2)
Total Protein: 7.1 g/dL (ref 6.0–8.3)

## 2018-03-03 LAB — HEMOGLOBIN A1C: Hgb A1c MFr Bld: 6.1 % (ref 4.6–6.5)

## 2018-03-03 LAB — BRAIN NATRIURETIC PEPTIDE: BRAIN NATRIURETIC PEPTIDE: 81 pg/mL (ref <100)

## 2018-03-03 LAB — TSH: TSH: 0.64 u[IU]/mL (ref 0.35–4.50)

## 2018-03-04 ENCOUNTER — Encounter: Payer: Self-pay | Admitting: Emergency Medicine

## 2018-03-04 ENCOUNTER — Other Ambulatory Visit: Payer: Self-pay

## 2018-03-04 ENCOUNTER — Encounter: Payer: Self-pay | Admitting: Internal Medicine

## 2018-03-04 ENCOUNTER — Ambulatory Visit: Payer: Medicare Other

## 2018-03-04 ENCOUNTER — Ambulatory Visit
Admission: EM | Admit: 2018-03-04 | Discharge: 2018-03-04 | Disposition: A | Discharge status: Home / Self Care | Payer: Medicare Other | Attending: Family Medicine | Admitting: Family Medicine

## 2018-03-04 DIAGNOSIS — W010XXA Fall on same level from slipping, tripping and stumbling without subsequent striking against object, initial encounter: Secondary | ICD-10-CM | POA: Insufficient documentation

## 2018-03-04 DIAGNOSIS — R0781 Pleurodynia: Secondary | ICD-10-CM | POA: Diagnosis not present

## 2018-03-04 DIAGNOSIS — Z885 Allergy status to narcotic agent status: Secondary | ICD-10-CM | POA: Insufficient documentation

## 2018-03-04 DIAGNOSIS — W0110XA Fall on same level from slipping, tripping and stumbling with subsequent striking against unspecified object, initial encounter: Secondary | ICD-10-CM | POA: Diagnosis not present

## 2018-03-04 DIAGNOSIS — J449 Chronic obstructive pulmonary disease, unspecified: Secondary | ICD-10-CM | POA: Diagnosis not present

## 2018-03-04 DIAGNOSIS — Z7901 Long term (current) use of anticoagulants: Secondary | ICD-10-CM | POA: Insufficient documentation

## 2018-03-04 DIAGNOSIS — Z79899 Other long term (current) drug therapy: Secondary | ICD-10-CM | POA: Insufficient documentation

## 2018-03-04 DIAGNOSIS — K219 Gastro-esophageal reflux disease without esophagitis: Secondary | ICD-10-CM | POA: Diagnosis not present

## 2018-03-04 DIAGNOSIS — K620 Anal polyp: Secondary | ICD-10-CM | POA: Insufficient documentation

## 2018-03-04 DIAGNOSIS — Z7984 Long term (current) use of oral hypoglycemic drugs: Secondary | ICD-10-CM | POA: Insufficient documentation

## 2018-03-04 DIAGNOSIS — Z9889 Other specified postprocedural states: Secondary | ICD-10-CM | POA: Insufficient documentation

## 2018-03-04 DIAGNOSIS — Z90710 Acquired absence of both cervix and uterus: Secondary | ICD-10-CM | POA: Insufficient documentation

## 2018-03-04 DIAGNOSIS — I110 Hypertensive heart disease with heart failure: Secondary | ICD-10-CM | POA: Diagnosis not present

## 2018-03-04 DIAGNOSIS — K589 Irritable bowel syndrome without diarrhea: Secondary | ICD-10-CM | POA: Diagnosis not present

## 2018-03-04 DIAGNOSIS — S20212A Contusion of left front wall of thorax, initial encounter: Secondary | ICD-10-CM | POA: Insufficient documentation

## 2018-03-04 DIAGNOSIS — Z950 Presence of cardiac pacemaker: Secondary | ICD-10-CM | POA: Diagnosis not present

## 2018-03-04 DIAGNOSIS — Z7982 Long term (current) use of aspirin: Secondary | ICD-10-CM | POA: Insufficient documentation

## 2018-03-04 DIAGNOSIS — Z803 Family history of malignant neoplasm of breast: Secondary | ICD-10-CM | POA: Insufficient documentation

## 2018-03-04 DIAGNOSIS — I509 Heart failure, unspecified: Secondary | ICD-10-CM | POA: Diagnosis not present

## 2018-03-04 DIAGNOSIS — Z9049 Acquired absence of other specified parts of digestive tract: Secondary | ICD-10-CM | POA: Diagnosis not present

## 2018-03-04 DIAGNOSIS — I495 Sick sinus syndrome: Secondary | ICD-10-CM | POA: Insufficient documentation

## 2018-03-04 DIAGNOSIS — Z86010 Personal history of colonic polyps: Secondary | ICD-10-CM | POA: Insufficient documentation

## 2018-03-04 DIAGNOSIS — I4891 Unspecified atrial fibrillation: Secondary | ICD-10-CM | POA: Insufficient documentation

## 2018-03-04 DIAGNOSIS — F411 Generalized anxiety disorder: Secondary | ICD-10-CM | POA: Diagnosis not present

## 2018-03-04 DIAGNOSIS — E785 Hyperlipidemia, unspecified: Secondary | ICD-10-CM | POA: Diagnosis not present

## 2018-03-04 DIAGNOSIS — I2510 Atherosclerotic heart disease of native coronary artery without angina pectoris: Secondary | ICD-10-CM | POA: Diagnosis not present

## 2018-03-04 DIAGNOSIS — Z880 Allergy status to penicillin: Secondary | ICD-10-CM | POA: Insufficient documentation

## 2018-03-04 DIAGNOSIS — K3189 Other diseases of stomach and duodenum: Secondary | ICD-10-CM | POA: Insufficient documentation

## 2018-03-04 DIAGNOSIS — E119 Type 2 diabetes mellitus without complications: Secondary | ICD-10-CM | POA: Diagnosis not present

## 2018-03-04 DIAGNOSIS — Z888 Allergy status to other drugs, medicaments and biological substances status: Secondary | ICD-10-CM | POA: Insufficient documentation

## 2018-03-04 NOTE — ED Provider Notes (Signed)
[facility]    CSN: [id number] Arrival date & time: 03/04/18  1328     History   Chief Complaint Chief Complaint  Patient presents with  . Fall    HPI Leah Holmes is a 84 y.o. female.   84 yo female with a c/o left sided rib pain since last night after slipping in bathroom and hitting the side of the bathroom sink. Denies hitting her head or loss of consciousness.    Fall     Past Medical History:  Diagnosis Date  . Atrial fibrillation ([place]) 2012  . CHF (congestive heart failure) ([place])   . Colon polyp 2003  . Coronary artery disease   . Diabetes mellitus without complication ([place])   . GERD (gastroesophageal reflux disease)   . Hyperlipidemia   . Hypertension   . Irritable bowel syndrome   . Polyp of colon   . PONV (postoperative nausea and vomiting)    with ether, not recently  . Ulcer     Patient Active Problem List   Diagnosis Date Noted  . Fatigue 10/27/2017  . Intention tremor 04/15/2017  . Osteoporosis 03/21/2016  . Sick sinus syndrome ([place]) 01/28/2016  . Other malaise 11/01/2015  . Major depressive disorder, recurrent episode ([place]) 10/30/2015  . Constipation 07/27/2015  . Generalized anxiety disorder 04/02/2015  . Ischemic chest pain 01/08/2015  . Atrial fibrillation ([place]) 08/16/2014  . Medicare annual wellness visit, subsequent 05/23/2014  . Dry mouth 03/10/2014  . Low serum vitamin D 02/16/2014  . Dysphagia, unspecified(787.20) 09/20/2013  . Anal polyp 09/20/2013  . Personal history of colonic polyps 08/31/2013  . CAD (coronary artery disease) 03/09/2013  . Diabetes mellitus without complication ([place]) 41/63/8453  . Hyperlipidemia associated with type 2 diabetes mellitus ([place]) 03/09/2013  . Essential hypertension, benign 03/09/2013  . Nonulcer dyspepsia 03/09/2013    Past Surgical History:  Procedure Laterality Date  . ABDOMINAL ADHESION SURGERY    . ABDOMINAL HYSTERECTOMY    . APPENDECTOMY    . BREAST SURGERY Right    biopsy   . CESAREAN SECTION     x 2  . CHOLECYSTECTOMY  03-2012  . COLONOSCOPY  2003   Dr Theodosia Paling in [place]  . fibroid tumor    . PACEMAKER INSERTION N/A 01/28/2016   Procedure: INSERTION PACEMAKER;  Surgeon: Isaias Cowman, MD;  Location: [facility] ORS;  Service: Cardiovascular;  Laterality: N/A;  . UPPER GI ENDOSCOPY  2003   Dr Theodosia Paling in [place]    OB History    Gravida  2   Para  2   Term      Preterm      AB      Living        SAB      TAB      Ectopic      Multiple      Live Births           Obstetric Comments  1st Menstrual Cycle:  16 1st Pregnancy:  30         Home Medications    Prior to Admission medications   Medication Sig Start Date End Date Taking? Authorizing Provider  albuterol (PROAIR HFA) 108 (90 Base) MCG/ACT inhaler Inhale 1-2 puffs into the lungs every 4 (four) hours as needed for wheezing or shortness of breath. 11/17/17  Yes Laverle Hobby, MD  amLODipine (NORVASC) 2.5 MG tablet Take 1 tablet (2.5 mg total) by mouth daily. 10/26/17  Yes Crecencio Mc, MD  aspirin  EC 81 MG tablet Take 81 mg by mouth daily. In am.   Yes [provider]  atenolol (TENORMIN) 25 MG tablet Take 1 tablet (25 mg total) by mouth daily. In the afternoon 03/02/18  Yes Crecencio Mc, MD  Cholecalciferol (VITAMIN D3) 1000 UNITS CAPS Take 1,000 Units by mouth daily. In am.   Yes [provider]  docusate sodium (COLACE) 50 MG capsule Take 50-100 mg by mouth daily as needed for mild constipation.    Yes [provider]  glipiZIDE (GLUCOTROL) 5 MG tablet TAKE 1/2 (ONE-HALF) TABLET BY MOUTH ONCE DAILY BEFORE SUPPER 10/18/17  Yes Crecencio Mc, MD  glucose blood (ONE TOUCH ULTRA TEST) test strip USE 1 STRIP TO CHECK GLUCOSE TWICE DAILY 02/15/18  Yes Crecencio Mc, MD  glucose blood (ONE TOUCH ULTRA TEST) test strip USE 1 STRIP TO CHECK GLUCOSE TWICE DAILY 03/03/18  Yes Crecencio Mc, MD  hydrochlorothiazide (HYDRODIURIL) 25 MG tablet  Take 25 mg by mouth every morning. 12/12/15  Yes [provider]  isosorbide mononitrate (IMDUR) 60 MG 24 hr tablet Take 60 mg by mouth 2 (two) times daily. 12/27/15  Yes [provider]  lisinopril (PRINIVIL,ZESTRIL) 20 MG tablet TAKE 1 TABLET BY MOUTH TWICE DAILY 11/19/17  Yes Crecencio Mc, MD  meclizine (ANTIVERT) 25 MG tablet TAKE 1 TABLET BY MOUTH THREE TIMES DAILY AS NEEDED 11/12/17  Yes Crecencio Mc, MD  nitroGLYCERIN (NITROSTAT) 0.4 MG SL tablet Place 0.4 mg under the tongue every 5 (five) minutes as needed for chest pain.   Yes [provider]  omeprazole (PRILOSEC) 20 MG capsule TAKE 1 CAPSULE BY MOUTH ONCE DAILY 08/16/17  Yes Crecencio Mc, MD  [facility] DELICA LANCETS 57W MISC USE 1  TO CHECK GLUCOSE TWICE DAILY 01/29/17  Yes Crecencio Mc, MD  psyllium (METAMUCIL) 58.6 % packet Take 1 packet by mouth daily as needed (for constipation).    Yes [provider]  warfarin (COUMADIN) 2 MG tablet Take 4 mg by mouth daily at 6 PM.    Yes [provider]  glucose blood (ONE TOUCH ULTRA TEST) test strip USE ONE STRIP TO CHECK GLUCOSE TWICE DAILY 01/29/17   Crecencio Mc, MD  lidocaine (LIDODERM) 5 % Place 1 patch onto the skin every 12 (twelve) hours. Remove & Discard patch within 12 hours or as directed by MD.  Pershing Proud the patch off for 12 hours before applying a new one. 01/12/18 01/12/19  Hinda Kehr, MD    Family History Family History  Problem Relation Age of Onset  . Cancer Sister 78       breast    Social History Social History   Tobacco Use  . Smoking status: Never Smoker  . Smokeless tobacco: Former Systems developer    Types: Snuff  . Tobacco comment: occasionally  Substance Use Topics  . Alcohol use: No  . Drug use: No     Allergies   Codeine; Penicillins; Atorvastatin; Clonidine derivatives; Gabapentin; Losartan; Morphine; Morphine and related; Reglan [metoclopramide]; and Hydralazine   Review of Systems Review of  Systems   Physical Exam Triage Vital Signs ED Triage Vitals  Enc Vitals Group     BP 03/04/18 1346 105/64     Pulse Rate 03/04/18 1346 60     Resp 03/04/18 1346 16     Temp 03/04/18 1346 98.1 F (36.7 C)     Temp Source 03/04/18 1346 Oral     SpO2 03/04/18 1346  99 %     Weight 03/04/18 1348 138 lb (62.6 kg)     Height 03/04/18 1348 5\' 6"  (1.676 m)     Head Circumference --      Peak Flow --      Pain Score 03/04/18 1348 9     Pain Loc --      Pain Edu? --      Excl. in [place]? --    No data found.  Updated Vital Signs BP 105/64 (BP Location: Left Arm)   Pulse 60   Temp 98.1 F (36.7 C) (Oral)   Resp 16   Ht 5\' 6"  (1.676 m)   Wt 62.6 kg   SpO2 99%   BMI 22.27 kg/m   Visual Acuity Right Eye Distance:   Left Eye Distance:   Bilateral Distance:    Right Eye Near:   Left Eye Near:    Bilateral Near:     Physical Exam  Constitutional: She appears well-developed and well-nourished. No distress.  Cardiovascular: Normal rate and normal heart sounds.  Pulmonary/Chest: Effort normal and breath sounds normal. No stridor. No respiratory distress. She has no wheezes. She has no rales. She exhibits tenderness (over left mid to lower ribs; ecchymosis to the skin noted over this area).  Skin: She is not diaphoretic.  Nursing note and vitals reviewed.    UC Treatments / Results  Labs (all labs ordered are listed, but only abnormal results are displayed) Labs Reviewed - No data to display  EKG None  Radiology Dg Ribs Unilateral W/chest Left  Result Date: 03/04/2018 CLINICAL DATA:  Left lateral rib pain after fall. EXAM: LEFT RIBS AND CHEST - 3+ VIEW COMPARISON:  12/27/2017. FINDINGS: Cardiac pacer with lead tip over the right ventricle. Cardiomegaly with normal pulmonary vascularity. No focal infiltrate. No pleural effusion or pneumothorax. Degenerative change thoracic spine. Mild gastric distention. IMPRESSION: 1. Cardiac pacer noted with lead tip over the right ventricle.  Cardiomegaly. No pulmonary venous congestion. 2.  COPD.  No acute pulmonary disease. 3.  Mild gastric distention. Electronically Signed   By: Marcello Moores  Register   On: 03/04/2018 14:34    Procedures Procedures (including critical care time)  Medications Ordered in UC Medications - No data to display  Initial Impression / Assessment and Plan / UC Course  I have reviewed the triage vital signs and the nursing notes.  Pertinent labs & imaging results that were available during my care of the patient were reviewed by me and considered in my medical decision making (see chart for details).      Final Clinical Impressions(s) / UC Diagnoses   Final diagnoses:  Rib contusion, left, initial encounter     Discharge Instructions     Rest, ice, tylenol    ED Prescriptions    None     1. x-ray results and diagnosis reviewed with patient 2. rx as per orders above; reviewed possible side effects, interactions, risks and benefits  3. Recommend supportive treatment as above 4. Follow-up prn if symptoms worsen or don't improve  Controlled Substance Prescriptions [place] Controlled Substance Registry consulted? Not Applicable   Norval Gable, MD 03/04/18 [phone]

## 2018-03-04 NOTE — Assessment & Plan Note (Signed)
She continues to worry excessively about her glycemic control  Which is excellent on current dose of glipizide based on assessment of A1c.  reassurance given.  No changes today  Lab Results  Component Value Date   HGBA1C 6.1 03/02/2018

## 2018-03-04 NOTE — Discharge Instructions (Signed)
Rest , ice, tylenol

## 2018-03-04 NOTE — Assessment & Plan Note (Signed)
Adding stool softener and BFL daily

## 2018-03-04 NOTE — ED Triage Notes (Signed)
Pt c/o pain in her rib area on the left side from a fall last night in her bathroom. She fell up against the counter in her bathroom. She did not fall all the was down on the floor. She reports that she has some bruising on her side and hurts worse when she takes a deep breath.

## 2018-03-04 NOTE — Assessment & Plan Note (Signed)
BNP was normal today and recent ER evaluation noted no ischemic EKGS changes or positive cardiac cardiac markers.  Encouraged to use ntg prn ad follow up with Dr [name]

## 2018-03-04 NOTE — Assessment & Plan Note (Addendum)
elevations appear to be aggravated by untreated/unacknowledged  anxiety.  Hme readings reviewed. Given several readings that are quite normal (systolic < 445) I am concerned that overly aggressive treatment will lead to orthostasis and increased risk of falling ,  And she is anticoagulated with coumadin.  Given her concurrent tremor,  We will Stop propranolol,  Start atenolol 25 mg daily  In the afternoon and titrate dose up to bid if tolerated.  Continue lisinopril, amlodipine, hctz  and Imdur   Her hypontremia is mild and stable.she may stop the amlodipine if the itching returns.   Daughter is an Therapist, sports and will coordinate with me.    Lab Results  Component Value Date   CREATININE 0.66 03/02/2018   Lab Results  Component Value Date   NA 132 (L) 03/02/2018   K 3.8 03/02/2018   CL 95 (L) 03/02/2018   CO2 28 03/02/2018

## 2018-03-07 ENCOUNTER — Emergency Department
Admission: EM | Admit: 2018-03-07 | Discharge: 2018-03-07 | Disposition: A | Discharge status: Home / Self Care | Payer: Medicare Other | Attending: Emergency Medicine | Admitting: Emergency Medicine

## 2018-03-07 ENCOUNTER — Encounter: Payer: Self-pay | Admitting: Emergency Medicine

## 2018-03-07 ENCOUNTER — Emergency Department: Payer: Medicare Other

## 2018-03-07 ENCOUNTER — Other Ambulatory Visit: Payer: Self-pay

## 2018-03-07 DIAGNOSIS — S3991XA Unspecified injury of abdomen, initial encounter: Secondary | ICD-10-CM | POA: Diagnosis not present

## 2018-03-07 DIAGNOSIS — W19XXXA Unspecified fall, initial encounter: Secondary | ICD-10-CM | POA: Insufficient documentation

## 2018-03-07 DIAGNOSIS — R109 Unspecified abdominal pain: Secondary | ICD-10-CM | POA: Insufficient documentation

## 2018-03-07 DIAGNOSIS — Y9389 Activity, other specified: Secondary | ICD-10-CM | POA: Diagnosis not present

## 2018-03-07 DIAGNOSIS — Z79899 Other long term (current) drug therapy: Secondary | ICD-10-CM | POA: Insufficient documentation

## 2018-03-07 DIAGNOSIS — Z87891 Personal history of nicotine dependence: Secondary | ICD-10-CM | POA: Diagnosis not present

## 2018-03-07 DIAGNOSIS — I110 Hypertensive heart disease with heart failure: Secondary | ICD-10-CM | POA: Insufficient documentation

## 2018-03-07 DIAGNOSIS — I509 Heart failure, unspecified: Secondary | ICD-10-CM | POA: Insufficient documentation

## 2018-03-07 DIAGNOSIS — I2510 Atherosclerotic heart disease of native coronary artery without angina pectoris: Secondary | ICD-10-CM | POA: Insufficient documentation

## 2018-03-07 DIAGNOSIS — Z7901 Long term (current) use of anticoagulants: Secondary | ICD-10-CM | POA: Insufficient documentation

## 2018-03-07 DIAGNOSIS — Y92012 Bathroom of single-family (private) house as the place of occurrence of the external cause: Secondary | ICD-10-CM | POA: Diagnosis not present

## 2018-03-07 DIAGNOSIS — Z7984 Long term (current) use of oral hypoglycemic drugs: Secondary | ICD-10-CM | POA: Insufficient documentation

## 2018-03-07 DIAGNOSIS — E119 Type 2 diabetes mellitus without complications: Secondary | ICD-10-CM | POA: Diagnosis not present

## 2018-03-07 DIAGNOSIS — Y998 Other external cause status: Secondary | ICD-10-CM | POA: Diagnosis not present

## 2018-03-07 DIAGNOSIS — Z950 Presence of cardiac pacemaker: Secondary | ICD-10-CM | POA: Diagnosis not present

## 2018-03-07 DIAGNOSIS — Z7982 Long term (current) use of aspirin: Secondary | ICD-10-CM | POA: Insufficient documentation

## 2018-03-07 DIAGNOSIS — S2020XA Contusion of thorax, unspecified, initial encounter: Secondary | ICD-10-CM | POA: Insufficient documentation

## 2018-03-07 DIAGNOSIS — S2241XA Multiple fractures of ribs, right side, initial encounter for closed fracture: Secondary | ICD-10-CM | POA: Diagnosis not present

## 2018-03-07 DIAGNOSIS — S299XXA Unspecified injury of thorax, initial encounter: Secondary | ICD-10-CM | POA: Diagnosis not present

## 2018-03-07 DIAGNOSIS — I10 Essential (primary) hypertension: Secondary | ICD-10-CM | POA: Diagnosis not present

## 2018-03-07 DIAGNOSIS — S2242XA Multiple fractures of ribs, left side, initial encounter for closed fracture: Secondary | ICD-10-CM | POA: Insufficient documentation

## 2018-03-07 DIAGNOSIS — S20219A Contusion of unspecified front wall of thorax, initial encounter: Secondary | ICD-10-CM | POA: Diagnosis not present

## 2018-03-07 LAB — BASIC METABOLIC PANEL
ANION GAP: 7 (ref 5–15)
BUN: 10 mg/dL (ref 8–23)
CALCIUM: 9.5 mg/dL (ref 8.9–10.3)
CHLORIDE: 96 mmol/L — AB (ref 98–111)
CO2: 28 mmol/L (ref 22–32)
CREATININE: 0.66 mg/dL (ref 0.44–1.00)
GFR calc non Af Amer: >60 mL/min (ref >60)
GLUCOSE: 188 mg/dL — AB (ref 70–99)
Potassium: 3.3 mmol/L — ABNORMAL LOW (ref 3.5–5.1)
Sodium: 131 mmol/L — ABNORMAL LOW (ref 135–145)

## 2018-03-07 LAB — PROTIME-INR
INR: 2.67
Prothrombin Time: 28.2 seconds — ABNORMAL HIGH (ref 11.4–15.2)

## 2018-03-07 LAB — CBC
HCT: 41.3 % (ref 35.0–47.0)
HEMOGLOBIN: 14.2 g/dL (ref 12.0–16.0)
MCH: 32.3 pg (ref 26.0–34.0)
MCHC: 34.4 g/dL (ref 32.0–36.0)
MCV: 94.1 fL (ref 80.0–100.0)
Platelets: 230 10*3/uL (ref 150–440)
RBC: 4.39 MIL/uL (ref 3.80–5.20)
RDW: 13.4 % (ref 11.5–14.5)
WBC: 5.8 10*3/uL (ref 3.6–11.0)

## 2018-03-07 LAB — TROPONIN I

## 2018-03-07 MED ORDER — FENTANYL CITRATE (PF) 100 MCG/2ML IJ SOLN
25.0000 ug | Freq: Once | INTRAMUSCULAR | Status: AC
  Administered 2018-03-07: 25 ug via INTRAVENOUS
  Filled 2018-03-07: qty 2

## 2018-03-07 MED ORDER — IOPAMIDOL (ISOVUE-300) INJECTION 61%
100.0000 mL | Freq: Once | INTRAVENOUS | Status: AC | PRN
  Administered 2018-03-07: 100 mL via INTRAVENOUS

## 2018-03-07 MED ORDER — TRAMADOL HCL 50 MG PO TABS: 50.0000 mg | ORAL_TABLET | Freq: Four times a day (QID) | ORAL | 0 refills | Status: DC | PRN

## 2018-03-07 MED ORDER — TRAMADOL HCL 50 MG PO TABS
50.0000 mg | ORAL_TABLET | Freq: Once | ORAL | Status: AC
  Administered 2018-03-07: 50 mg via ORAL
  Filled 2018-03-07: qty 1

## 2018-03-07 MED ORDER — ONDANSETRON HCL 4 MG/2ML IJ SOLN
4.0000 mg | Freq: Once | INTRAMUSCULAR | Status: AC
  Administered 2018-03-07: 4 mg via INTRAVENOUS
  Filled 2018-03-07: qty 2

## 2018-03-07 NOTE — ED Notes (Signed)
Pt fell last Thursday and hit corner of bathroom counter - she has gotten increasingly sore and having pain with movement, respirations, sneezing, etc. - ecchymosis noted under left breast and into left side Dr Reita Cliche in room assessing pt

## 2018-03-07 NOTE — ED Notes (Signed)
Pt daughter reports that pt is c/o shortness of breath and tightness in chest - she wanted to know whether to give her one of her NTG - advised her not to give personal medication - Dr Reita Cliche notified and advised that she would go and evaluate the pt

## 2018-03-07 NOTE — ED Triage Notes (Signed)
Daughter reports patient fell Thursday night and hit abdomen on bathroom counter. Seen through [facility] on Friday and was evaluated.  Arrives today for ED evaluation of pain.  Pain worsened over the weekend.

## 2018-03-07 NOTE — ED Notes (Signed)
Pt c/o increased shortness of breath - no acute distress noted - change observed in EKG - new EKG obtained per Dr Corky Downs - Dr Corky Downs reviewed new EKG and reports that it is not of concern at this time

## 2018-03-07 NOTE — ED Provider Notes (Addendum)
[facility] Emergency Department Provider Note ____________________________________________   I have reviewed the triage vital signs and the triage nursing note.  HISTORY  Chief Complaint No chief complaint on file.   [place] Patient's daughter, with whom she lives  HPI Leah Holmes is a 84 y.o. female presenting from home where she lives with her daughter, is on Coumadin for history of A. fib, had a mechanical fall a few days ago striking left posterior rib aspect onto bathroom counter.  Due to pain she was seen at urgent care over the weekend and has been taking Tylenol and has had worsening pain when she moves and breathes.   She has a large bruise to her left upper flank area.  Pain is severe when it is touched or moved.     Past Medical History:  Diagnosis Date  . Atrial fibrillation ([place]) 2012  . CHF (congestive heart failure) ([place])   . Colon polyp 2003  . Coronary artery disease   . Diabetes mellitus without complication ([place])   . GERD (gastroesophageal reflux disease)   . Hyperlipidemia   . Hypertension   . Irritable bowel syndrome   . Polyp of colon   . PONV (postoperative nausea and vomiting)    with ether, not recently  . Ulcer     Patient Active Problem List   Diagnosis Date Noted  . Fatigue 10/27/2017  . Intention tremor 04/15/2017  . Osteoporosis 03/21/2016  . Sick sinus syndrome ([place]) 01/28/2016  . Other malaise 11/01/2015  . Major depressive disorder, recurrent episode ([place]) 10/30/2015  . Constipation 07/27/2015  . Generalized anxiety disorder 04/02/2015  . Ischemic chest pain 01/08/2015  . Atrial fibrillation ([place]) 08/16/2014  . Medicare annual wellness visit, subsequent 05/23/2014  . Dry mouth 03/10/2014  . Low serum vitamin D 02/16/2014  . Dysphagia, unspecified(787.20) 09/20/2013  . Anal polyp 09/20/2013  . Personal history of colonic polyps 08/31/2013  . CAD (coronary artery disease) 03/09/2013  . Diabetes mellitus  without complication ([place]) 84/13/2440  . Hyperlipidemia associated with type 2 diabetes mellitus ([place]) 03/09/2013  . Essential hypertension, benign 03/09/2013  . Nonulcer dyspepsia 03/09/2013    Past Surgical History:  Procedure Laterality Date  . ABDOMINAL ADHESION SURGERY    . ABDOMINAL HYSTERECTOMY    . APPENDECTOMY    . BREAST SURGERY Right    biopsy   . CESAREAN SECTION     x 2  . CHOLECYSTECTOMY  03-2012  . COLONOSCOPY  2003   Dr Theodosia Paling in [place]  . fibroid tumor    . PACEMAKER INSERTION N/A 01/28/2016   Procedure: INSERTION PACEMAKER;  Surgeon: Isaias Cowman, MD;  Location: [facility] ORS;  Service: Cardiovascular;  Laterality: N/A;  . UPPER GI ENDOSCOPY  2003   Dr Theodosia Paling in [place]    Prior to Admission medications   Medication Sig Start Date End Date Taking? Authorizing Provider  albuterol (PROAIR HFA) 108 (90 Base) MCG/ACT inhaler Inhale 1-2 puffs into the lungs every 4 (four) hours as needed for wheezing or shortness of breath. 11/17/17   Laverle Hobby, MD  amLODipine (NORVASC) 2.5 MG tablet Take 1 tablet (2.5 mg total) by mouth daily. 10/26/17   Crecencio Mc, MD  aspirin EC 81 MG tablet Take 81 mg by mouth daily. In am.    [provider]  atenolol (TENORMIN) 25 MG tablet Take 1 tablet (25 mg total) by mouth daily. In the afternoon 03/02/18   Crecencio Mc, MD  Cholecalciferol (VITAMIN D3) 1000 UNITS  CAPS Take 1,000 Units by mouth daily. In am.    [provider]  docusate sodium (COLACE) 50 MG capsule Take 50-100 mg by mouth daily as needed for mild constipation.     [provider]  glipiZIDE (GLUCOTROL) 5 MG tablet TAKE 1/2 (ONE-HALF) TABLET BY MOUTH ONCE DAILY BEFORE SUPPER 10/18/17   Crecencio Mc, MD  glucose blood (ONE TOUCH ULTRA TEST) test strip USE ONE STRIP TO CHECK GLUCOSE TWICE DAILY 01/29/17   Crecencio Mc, MD  glucose blood (ONE TOUCH ULTRA TEST) test strip USE 1 STRIP TO CHECK GLUCOSE TWICE DAILY 02/15/18   Crecencio Mc, MD  glucose blood (ONE TOUCH ULTRA TEST) test strip USE 1 STRIP TO CHECK GLUCOSE TWICE DAILY 03/03/18   Crecencio Mc, MD  hydrochlorothiazide (HYDRODIURIL) 25 MG tablet Take 25 mg by mouth every morning. 12/12/15   [provider]  isosorbide mononitrate (IMDUR) 60 MG 24 hr tablet Take 60 mg by mouth 2 (two) times daily. 12/27/15   [provider]  lidocaine (LIDODERM) 5 % Place 1 patch onto the skin every 12 (twelve) hours. Remove & Discard patch within 12 hours or as directed by MD.  [place] Proud the patch off for 12 hours before applying a new one. 01/12/18 01/12/19  Hinda Kehr, MD  lisinopril (PRINIVIL,ZESTRIL) 20 MG tablet TAKE 1 TABLET BY MOUTH TWICE DAILY 11/19/17   Crecencio Mc, MD  meclizine (ANTIVERT) 25 MG tablet TAKE 1 TABLET BY MOUTH THREE TIMES DAILY AS NEEDED 11/12/17   Crecencio Mc, MD  nitroGLYCERIN (NITROSTAT) 0.4 MG SL tablet Place 0.4 mg under the tongue every 5 (five) minutes as needed for chest pain.    [provider]  omeprazole (PRILOSEC) 20 MG capsule TAKE 1 CAPSULE BY MOUTH ONCE DAILY 08/16/17   Crecencio Mc, MD  [facility] DELICA LANCETS 62B MISC USE 1  TO CHECK GLUCOSE TWICE DAILY 01/29/17   Crecencio Mc, MD  psyllium (METAMUCIL) 58.6 % packet Take 1 packet by mouth daily as needed (for constipation).     [provider]  traMADol (ULTRAM) 50 MG tablet Take 1 tablet (50 mg total) by mouth every 6 (six) hours as needed for severe pain. 03/07/18   Lisa Roca, MD  warfarin (COUMADIN) 2 MG tablet Take 4 mg by mouth daily at 6 PM.     [provider]    Allergies  Allergen Reactions  . Codeine Anaphylaxis  . Penicillins Anaphylaxis    Has patient had a PCN reaction causing immediate rash, facial/tongue/throat swelling, SOB or lightheadedness with hypotension: Yes Has patient had a PCN reaction causing severe rash involving mucus membranes or skin necrosis: No Has patient had a PCN reaction that required  hospitalization Yes Has patient had a PCN reaction occurring within the last 10 years: No If all of the above answers are "NO", then may proceed with Cephalosporin use.  . Atorvastatin     Other reaction(s): Unknown  . Clonidine Derivatives Other (See Comments)    Reaction:  Unknown   . Gabapentin Nausea Only  . Losartan Other (See Comments)    Other reaction(s): Other (See Comments) Reaction:  Muscle aches  Reaction:  Muscle aches   . Morphine     Other reaction(s): Unknown  . Morphine And Related Other (See Comments)    Reaction:  Unknown   . Reglan [Metoclopramide] Other (See Comments)    Reaction:  Tremors   . Hydralazine Anxiety    Family  History  Problem Relation Age of Onset  . Cancer Sister 64       breast    Social History Social History   Tobacco Use  . Smoking status: Never Smoker  . Smokeless tobacco: Former Systems developer    Types: Snuff  . Tobacco comment: occasionally  Substance Use Topics  . Alcohol use: No  . Drug use: No    Review of Systems  Constitutional: Negative for recent illness. Eyes: Negative for visual changes. ENT: Negative for sore throat. Cardiovascular: Negative for chest pain. Respiratory: Negative for shortness of breath. Gastrointestinal: Positive for abdominal pain.   Genitourinary: Negative for dysuria. Musculoskeletal: Positive for left flank/rib pain. Skin: Negative for rash. Neurological: Negative for headache.  Normal mental status.  ____________________________________________   PHYSICAL EXAM:  VITAL SIGNS: ED Triage Vitals  Enc Vitals Group     BP 03/07/18 0948 (!) 108/55     Pulse Rate 03/07/18 0948 63     Resp 03/07/18 0948 18     Temp 03/07/18 0948 98 F (36.7 C)     Temp Source 03/07/18 0948 Oral     SpO2 03/07/18 0948 96 %     Weight 03/07/18 0949 137 lb (62.1 kg)     Height 03/07/18 0949 5\' 6"  (1.676 m)     Head Circumference --      Peak Flow --      Pain Score 03/07/18 1007 10     Pain Loc --      Pain  Edu? --      Excl. in [place]? --      Constitutional: Alert and cooperative.  HEENT      Head: Normocephalic and atraumatic.      Eyes: Conjunctivae are normal. Pupils equal and round.       Ears:         Nose: No congestion/rhinnorhea.      Mouth/Throat: Mucous membranes are moist.      Neck: No stridor. Cardiovascular/Chest: Normal rate, regular rhythm.  No murmurs, rubs, or gallops. Respiratory: Normal respiratory effort without tachypnea nor retractions. Breath sounds are clear and equal bilaterally. No wheezes/rales/rhonchi.  Pain with deep breathing.  Bruising to the left posterior lower rib margin.   Gastrointestinal: Soft. No distention, no guarding, no rebound.  Mild to moderate tenderness in the mid abdomen over towards the left side. Genitourinary/rectal:Deferred Musculoskeletal: Nontender with normal range of motion in all extremities. No joint effusions.  No lower extremity tenderness.  No edema. Neurologic: Facial droop.  Normal speech and language. No gross or focal neurologic deficits are appreciated. Skin:  Skin is warm, dry and intact. No rash noted. Psychiatric: Mood and affect are normal. Speech and behavior are normal. Patient exhibits appropriate insight and judgment.   ____________________________________________  LABS (pertinent positives/negatives) I, Lisa Roca, MD the attending physician have reviewed the labs noted below.  Labs Reviewed  BASIC METABOLIC PANEL - Abnormal; Notable for the following components:      Result Value   Sodium 131 (*)    Potassium 3.3 (*)    Chloride 96 (*)    Glucose, Bld 188 (*)    All other components within normal limits  PROTIME-INR - Abnormal; Notable for the following components:   Prothrombin Time 28.2 (*)    All other components within normal limits  CBC  TROPONIN I    ____________________________________________    EKG I, Lisa Roca, MD, the attending physician have personally viewed and interpreted all  ECGs.  Addended:  Added ECG interpetation -61 bpm.  Undetermined rhythm, appears to be sinus rhythm with AV block.  Appears to be atrially sensed ventricularly paced ____________________________________________  RADIOLOGY  Chest x-ray, radiology report reviewed: No fracture or acute finding.  CT chest abdomen pelvis with contrast, reviewed radiologist report:  IMPRESSION: 1. Nondisplaced left eighth and ninth rib fracture. No pneumothorax. 2. No evidence of visceral injury. 3. Circumferential thickening distal transverse colon and descending colon. Although this may be partially explained by under distension, colitis cannot be excluded. This may account for trace free fluid in the pelvis. 4. 2.7 cm lobulated mass upper left breast. Correlation with mammography recommended. 5. Pacemaker in place. Coronary artery calcifications. 6. Aortic Atherosclerosis (ICD10-I70.0). Narrowing of the origin of the celiac artery, superior mesenteric artery and renal arteries. Mild to moderate narrowing origin left subclavian artery. __________________________________________  PROCEDURES  Procedure(s) performed: None  Procedures  Critical Care performed: None   ____________________________________________  ED COURSE / ASSESSMENT AND PLAN  Pertinent labs & imaging results that were available during my care of the patient were reviewed by me and considered in my medical decision making (see chart for details).    I was able to review [place] urgent care notes, patient apparently had an x-ray which was negative for fracture.  On exam she has a large ecchymosis along the upper flank/lower rib margin which is exquisitely tender to palpation, and I am very suspicious of a clinical rib fracture despite negative x-ray by urgent care as well as today.  She does have some tenderness to the abdomen and is very difficult to say whether or not this is actual abdominal pain or because it is moving the  rib.  However especially given the fact that she is on Coumadin, with the abdominal pain, I did discuss obtaining CT of the chest to elucidate where the significant pain is coming from, likely confirm fracture, as well as scan through the abdomen pelvis to ensure that there is no intra-abdominal traumatic emergency  CT scan confirms left nondisplaced eighth and ninth rib fractures without additional complication.  No intra-abdominal visceral traumatic findings.  Several incidental findings discussed with patient and daughter and they were given a copy of the report so they can follow-up with her primary care doctor specifically for the mammogram to evaluate left breast mass.  Patient okay for outpatient management of pain associated with clinical rib fractures.  Patient is going to try tramadol here.  Patient reports anaphylactic throat closing reaction to penicillin years ago at the same time she had received codeine and she states that her doctor told her not to take codeine either.  States that her allergy to morphine was that her legs got weak and she felt weak all over.  It seems unlikely that she has a significant or anaphylactic allergy that would preclude use of tramadol and we discussed that she would try a tablet here.  We discussed that is extremely important to get adequate pain relief so that she minimizes the risk of developing pneumonia.  She will continue Tylenol around-the-clock, she had been told not to take ibuprofen secondary to it sounds like possible history of GI bleeding.  We discussed in risk management, to add some anti-inflammatory relief in the short-term.  Patient will be given incentive spirometer.  CONSULTATIONS:   None   Patient / Family / Caregiver informed of clinical course, medical decision-making process, and agree with plan.   I discussed return precautions, follow-up instructions, and discharge instructions with patient  and/or family.  Discharge  Instructions : Return to the emergency department immediately for any worsening condition including worsening uncontrolled pain, no worsening shortness of breath, fever, or any other symptoms concerning to you.    ___________________________________________   FINAL CLINICAL IMPRESSION(S) / ED DIAGNOSES   Final diagnoses:  Contusion, trunk, initial encounter  Closed fracture of multiple ribs of left side, initial encounter      ___________________________________________         Note: This dictation was prepared with Dragon dictation. Any transcriptional errors that result from this process are unintentional    Lisa Roca, MD 03/07/18 1305    Lisa Roca, MD 03/18/18 1400

## 2018-03-07 NOTE — Discharge Instructions (Addendum)
Return to the emergency department immediately for any worsening condition including worsening uncontrolled pain, no worsening shortness of breath, fever, or any other symptoms concerning to you.

## 2018-03-08 ENCOUNTER — Telehealth: Payer: Self-pay | Admitting: Internal Medicine

## 2018-03-08 ENCOUNTER — Telehealth: Payer: Self-pay

## 2018-03-08 DIAGNOSIS — N6320 Unspecified lump in the left breast, unspecified quadrant: Secondary | ICD-10-CM

## 2018-03-08 NOTE — Telephone Encounter (Signed)
Copied from [place] [id number] [phone]. Topic: Referral - Request >> Mar 08, 2018  1:37 PM Conception Chancy, NT wrote: Reason for CRM: patient was seen yesterday for 2 fractured ribs, the daughter states the CT also showed a 2.7cm breast mass in L breast and would like a mammogram.

## 2018-03-08 NOTE — Telephone Encounter (Signed)
Copied from [place] [id number]. Topic: Quick Communication - See Telephone Encounter >> Mar 08, 2018  1:36 PM Conception Chancy, NT wrote: CRM for notification. See Telephone encounter for: 03/08/18.  Patient daughter is calling and states [facility] has sent multiple fax about getting a diagnoses code for glucose blood (ONE TOUCH ULTRA TEST) test strip for the insurance before it can be filled. Please advise.  [place], [place] [address] [address] Phone: [phone] Fax: [phone]

## 2018-03-09 ENCOUNTER — Ambulatory Visit: Payer: Self-pay | Admitting: *Deleted

## 2018-03-09 ENCOUNTER — Other Ambulatory Visit: Payer: Self-pay

## 2018-03-09 ENCOUNTER — Other Ambulatory Visit: Payer: Self-pay | Admitting: Internal Medicine

## 2018-03-09 DIAGNOSIS — N6320 Unspecified lump in the left breast, unspecified quadrant: Secondary | ICD-10-CM

## 2018-03-09 MED ORDER — GLUCOSE BLOOD VI STRP: ORAL_STRIP | 5 refills | Status: DC

## 2018-03-09 NOTE — Telephone Encounter (Signed)
CT results are in pt's chart.

## 2018-03-09 NOTE — Telephone Encounter (Signed)
Pt's daughter called and stated that the pt is having some trouble with constipation. She stated that the pt's last BM was on Monday. The pt has tried metamucil, colace, sitz bath, prune juice, and increased her fluids and nothing seems to be helping. Pt is not having any abdominal pain, nausea or vomiting. The daughter is wanting to know what to try next or if she needs to try [place]. With the pt's broken ribs it is difficult for the pt to strain.

## 2018-03-09 NOTE — Telephone Encounter (Signed)
Rx resent with diagnosis code to patients pharmacy

## 2018-03-09 NOTE — Telephone Encounter (Signed)
she can try milk of magnesia or Dulcolax (if taking any pain relievers, may need the Dulcolax

## 2018-03-09 NOTE — Telephone Encounter (Signed)
Spoke with pt's daughter and informed her of the medications that Dr. Derrel Nip has advised the pt can try. Pt's daughter gave a verbal understanding.

## 2018-03-09 NOTE — Telephone Encounter (Signed)
DIAGNOSTIC MAMMOGRAM AND ULTRASOUND ORDERED PER DAUGHTER'S REQUEST.  Given that she has 2 broken ribs.  She may not tolerate the positioning for the mammogram due to pain.  I recommend pre treating with 400 mg advil and  650 mg of tylenol before the procedure

## 2018-03-09 NOTE — Telephone Encounter (Signed)
Recommendations?

## 2018-03-09 NOTE — Telephone Encounter (Signed)
Spoke with pt's daughter and informed her that the mammogram has been ordered and gave her the number to call and get it scheduled. Also advised the daughter of the medication that she should take before the procedure to help with the pain since she does have two broken ribs.

## 2018-03-09 NOTE — Addendum Note (Signed)
Addended by: Crecencio Mc on: 03/09/2018 10:55 AM   Modules accepted: Orders

## 2018-03-09 NOTE — Telephone Encounter (Signed)
Pt's daughter called requesting recommendations for constipation and pt having hemorrhoids. Daughter states the patient last bm was on Monday after an enema.  She has tried metamucil, colace, sitz bath, prune juice, increasing her fluids and nothing has helped.  She is denying abd pain, or nausea or vomiting. She wanted to know if milk of magnesia is the next step. The patient has fracture ribs so it is painful when she strains. So trying to avoid that.  She is requesting a call back from her provider for any suggestions. Will route to [facility] at [facility].  Reason for Disposition . Nursing judgment  Protocols used: NO GUIDELINE OR REFERENCE AVAILABLE-A-AH

## 2018-03-14 ENCOUNTER — Encounter: Payer: Self-pay | Admitting: Internal Medicine

## 2018-03-14 ENCOUNTER — Ambulatory Visit (INDEPENDENT_AMBULATORY_CARE_PROVIDER_SITE_OTHER): Payer: Medicare Other | Admitting: Internal Medicine

## 2018-03-14 VITALS — BP 120/68 | HR 64 | Temp 97.7°F | Resp 15 | Ht 66.0 in | Wt 138.8 lb

## 2018-03-14 DIAGNOSIS — I208 Other forms of angina pectoris: Secondary | ICD-10-CM | POA: Diagnosis not present

## 2018-03-14 DIAGNOSIS — I700 Atherosclerosis of aorta: Secondary | ICD-10-CM

## 2018-03-14 DIAGNOSIS — N6322 Unspecified lump in the left breast, upper inner quadrant: Secondary | ICD-10-CM | POA: Insufficient documentation

## 2018-03-14 DIAGNOSIS — Z9181 History of falling: Secondary | ICD-10-CM | POA: Diagnosis not present

## 2018-03-14 DIAGNOSIS — K5909 Other constipation: Secondary | ICD-10-CM | POA: Diagnosis not present

## 2018-03-14 DIAGNOSIS — N6320 Unspecified lump in the left breast, unspecified quadrant: Secondary | ICD-10-CM

## 2018-03-14 DIAGNOSIS — R2689 Other abnormalities of gait and mobility: Secondary | ICD-10-CM

## 2018-03-14 DIAGNOSIS — S2242XA Multiple fractures of ribs, left side, initial encounter for closed fracture: Secondary | ICD-10-CM | POA: Diagnosis not present

## 2018-03-14 MED ORDER — POTASSIUM CHLORIDE CRYS ER 20 MEQ PO TBCR: EXTENDED_RELEASE_TABLET | ORAL | 3 refills | Status: DC

## 2018-03-14 MED ORDER — ROSUVASTATIN CALCIUM 5 MG PO TABS: 5.0000 mg | ORAL_TABLET | Freq: Every day | ORAL | 5 refills | Status: DC

## 2018-03-14 NOTE — Assessment & Plan Note (Addendum)
Noted as an Incidental finding on chest CT during ER evaluation for persistent rib pain following a fall with negative plain films. .  I have confirmed the presence of the mass with Richarda Overlie, MD (radiology) as separate from the pacemaker and not likely to be a hematoma given its location (ribs 3 and 4 ) away from the 8th and 9th posterior rib fractures.  Patient is scheduled for mammogram and ultrasound.  May require biopsy/aspiration,  In which cause she has requested [facility] clinic surgery to do

## 2018-03-14 NOTE — Patient Instructions (Addendum)
Continue taking the atenolol  once daily in the afternoon  Continue the amlodipine once daily in the morning   Your potassium was low.  I want you to take a potassium pill every other day,  With your fluid pill   I will check with radiology about the ct scan findings   I will order home physical therapy once you are feeling better

## 2018-03-14 NOTE — Progress Notes (Signed)
Subjective:  Patient ID: Leah Holmes, female    DOB: [birth year]  Age: 84 y.o. MRN: [id number]  CC: Diagnoses of Breast mass, left, Other constipation, Atherosclerosis of aorta ([place]), Balance problem, Closed fracture of multiple ribs of left side, initial encounter, and Personal history of fall, presenting hazards to health were pertinent to this visit.  HPI [name] presents for ER follow up  Patient is accompanied by her daughter .  She was Seen in office Sept 18 for worsening tremor,  Hemrorhoids,  Uncontrolled hypertension, and medication changes were made, specifically Atenolol 25 mg daly  started and propranolol stopped   Treated in ER on Sept 23 for rib fractures.  Suffered a  fall at home on Sept 20 .  Patient states that she lost her balance in the bathroom and grabbed the edge of the sink to to avoid falling.  She struck her posterior thorax on the sink edge  And developed pain and redness of her left arm and left back.  Family took her to Urgent Care , s rays did not reveal any fractures and she was sent home .  Returned toUrgent care and sent to  ER Sept 23 for persistent pain and bruising to upper flank area  .  CT chest  abd and pelvis done:  Nondisplaced posterior  rib fractures  (8 and 9) along with 2.7 cm lobulated mass in left breast , distal to the Pacemaker box, ,at the level of the  left of 3/4 th ribs ,   along with arotic atherosclerosis and narrowing of celiac , SMA and renal , and left subclavian arteries. INR was  Therapeutic at  2.6 ,  Potassium low at 3.3 (3.8 on sept 18), sodium low but stable, and troponin I was normal.  Coumadin  Was not held . Potassium not replaced       Pain currently present but improved,  , managed only with tylenol. Tried lidocaine patches without improvement  And the trial of tramadol upset her stomach  stillbohered by tremor.  constipation improved however with new regimen.   Diagnostic mammogram of left breast ordered  With [place] for Oct  7 .  breast exam done today and no mass felt other than the pacemaker box.   Patient is using a walker at home, has adequate family supervision.  Agrees to having home PT for balance  Outpatient Medications Prior to Visit  Medication Sig Dispense Refill  . albuterol (PROAIR HFA) 108 (90 Base) MCG/ACT inhaler Inhale 1-2 puffs into the lungs every 4 (four) hours as needed for wheezing or shortness of breath. 1 Inhaler 5  . amLODipine (NORVASC) 2.5 MG tablet Take 1 tablet (2.5 mg total) by mouth daily. 90 tablet 3  . aspirin EC 81 MG tablet Take 81 mg by mouth daily. In am.    . atenolol (TENORMIN) 25 MG tablet Take 1 tablet (25 mg total) by mouth daily. In the afternoon 90 tablet 3  . Cholecalciferol (VITAMIN D3) 1000 UNITS CAPS Take 1,000 Units by mouth daily. In am.    . docusate sodium (COLACE) 50 MG capsule Take 50-100 mg by mouth daily as needed for mild constipation.     Marland Kitchen glipiZIDE (GLUCOTROL) 5 MG tablet TAKE 1/2 (ONE-HALF) TABLET BY MOUTH ONCE DAILY BEFORE SUPPER 45 tablet 2  . glucose blood (ONE TOUCH ULTRA TEST) test strip USE ONE STRIP TO CHECK GLUCOSE TWICE DAILY 200 each 3  . glucose blood (ONE TOUCH ULTRA TEST) test strip  USE 1 STRIP TO CHECK GLUCOSE TWICE DAILY 100 each 2  . glucose blood (ONE TOUCH ULTRA TEST) test strip USE 1 STRIP TO CHECK GLUCOSE TWICE DAILY.DX E11.9 200 each 5  . hydrochlorothiazide (HYDRODIURIL) 25 MG tablet Take 25 mg by mouth every morning.    . isosorbide mononitrate (IMDUR) 60 MG 24 hr tablet Take 60 mg by mouth 2 (two) times daily.    Marland Kitchen lisinopril (PRINIVIL,ZESTRIL) 20 MG tablet TAKE 1 TABLET BY MOUTH TWICE DAILY 180 tablet 1  . meclizine (ANTIVERT) 25 MG tablet TAKE 1 TABLET BY MOUTH THREE TIMES DAILY AS NEEDED 90 tablet 1  . nitroGLYCERIN (NITROSTAT) 0.4 MG SL tablet Place 0.4 mg under the tongue every 5 (five) minutes as needed for chest pain.    Marland Kitchen omeprazole (PRILOSEC) 20 MG capsule TAKE 1 CAPSULE BY MOUTH ONCE DAILY 90 capsule 1  . ONETOUCH DELICA  LANCETS 56L MISC USE 1  TO CHECK GLUCOSE TWICE DAILY 200 each 0  . propranolol (INDERAL) 10 MG tablet   0  . psyllium (METAMUCIL) 58.6 % packet Take 1 packet by mouth daily as needed (for constipation).     . warfarin (COUMADIN) 2 MG tablet Take 4 mg by mouth daily at 6 PM.     . lidocaine (LIDODERM) 5 % Place 1 patch onto the skin every 12 (twelve) hours. Remove & Discard patch within 12 hours or as directed by MD.  Pershing Proud the patch off for 12 hours before applying a new one. (Patient not taking: Reported on 03/14/2018) 10 patch 0  . traMADol (ULTRAM) 50 MG tablet Take 1 tablet (50 mg total) by mouth every 6 (six) hours as needed for severe pain. (Patient not taking: Reported on 03/14/2018) 10 tablet 0   No facility-administered medications prior to visit.     Review of Systems;  Patient denies headache, fevers, malaise, unintentional weight loss, skin rash, eye pain, sinus congestion and sinus pain, sore throat, dysphagia,  hemoptysis , cough, dyspnea, wheezing, chest pain, palpitations, orthopnea, edema, abdominal pain, nausea, melena, diarrhea, constipation, flank pain, dysuria, hematuria, urinary  Frequency, nocturia, numbness, tingling, seizures,  Focal weakness, Loss of consciousness,  Tremor, insomnia, depression, anxiety, and suicidal ideation.      Objective:  BP 120/68 (BP Location: Left Arm, Patient Position: Sitting, Cuff Size: Normal)   Pulse 64   Temp 97.7 F (36.5 C) (Oral)   Resp 15   Ht 5\' 6"  (1.676 m)   Wt 138 lb 12.8 oz (63 kg)   SpO2 97%   BMI 22.40 kg/m   BP Readings from Last 3 Encounters:  03/14/18 120/68  03/07/18 129/63  03/04/18 105/64    Wt Readings from Last 3 Encounters:  03/14/18 138 lb 12.8 oz (63 kg)  03/07/18 137 lb (62.1 kg)  03/04/18 138 lb (62.6 kg)   General appearance: alert, cooperative and appears stated age Head: Normocephalic, without obvious abnormality, atraumatic Eyes: conjunctivae/corneas clear. PERRL, EOM's intact. Fundi  benign. Ears: normal TM's and external ear canals both ears Nose: Nares normal. Septum midline. Mucosa normal. No drainage or sinus tenderness. Throat: lips, mucosa, and tongue normal; teeth and gums normal Neck: no adenopathy, no carotid bruit, no JVD, supple, symmetrical, trachea midline and thyroid not enlarged, symmetric, no tenderness/mass/nodules Lungs: clear to auscultation bilaterally Chest wall: old sternal bruise at 4th rib level on right,  PM box  On left.  Breasts: normal appearance for age, no masses or tenderness Heart: regular rate and rhythm, S1, S2 normal, no  murmur, click, rub or gallop Abdomen: soft, non-tender; bowel sounds normal; no masses,  no organomegaly Extremities: extremities normal, atraumatic, no cyanosis or edema Pulses: 2+ and symmetric Skin: Skin color, texture, turgor normal. No rashes or lesions Neurologic: Alert and oriented X 3, normal strength and tone. Normal symmetric reflexes. Normal coordination, gait is hesitant and unsteady .    Lab Results  Component Value Date   HGBA1C 6.1 03/02/2018   HGBA1C 5.9 10/26/2017   HGBA1C 5.9 04/14/2017    Lab Results  Component Value Date   CREATININE 0.66 03/07/2018   CREATININE 0.66 03/02/2018   CREATININE 0.55 01/11/2018    Lab Results  Component Value Date   WBC 5.8 03/07/2018   HGB 14.2 03/07/2018   HCT 41.3 03/07/2018   PLT 230 03/07/2018   GLUCOSE 188 (H) 03/07/2018   CHOL 204 (H) 10/30/2015   TRIG 94.0 10/30/2015   HDL 57.80 10/30/2015   LDLDIRECT 116.0 10/30/2015   LDLCALC 128 (H) 10/30/2015   ALT 19 03/02/2018   AST 23 03/02/2018   NA 131 (L) 03/07/2018   K 3.3 (L) 03/07/2018   CL 96 (L) 03/07/2018   CREATININE 0.66 03/07/2018   BUN 10 03/07/2018   CO2 28 03/07/2018   TSH 0.64 03/02/2018   INR 2.67 03/07/2018   HGBA1C 6.1 03/02/2018   MICROALBUR <0.7 04/14/2017    Dg Chest 2 View  Result Date: 03/07/2018 CLINICAL DATA:  Recent fall EXAM: CHEST - 2 VIEW COMPARISON:  03/04/2018  FINDINGS: Left subclavian pacemaker device and leads are stable and intact. The heart is borderline enlarged. Normal vascularity. Lungs are hyperaerated and clear. IMPRESSION: No active cardiopulmonary disease. Electronically Signed   By: Marybelle Killings M.D.   On: 03/07/2018 10:43   Ct Chest W Contrast  Result Date: 03/07/2018 CLINICAL DATA:  84 year old female fell hitting left side 4 days ago. Pain. Subsequent encounter. EXAM: CT CHEST, ABDOMEN, AND PELVIS WITH CONTRAST TECHNIQUE: Multidetector CT imaging of the chest, abdomen and pelvis was performed following the standard protocol during bolus administration of intravenous contrast. CONTRAST:  185mL ISOVUE-300 IOPAMIDOL (ISOVUE-300) INJECTION 61% COMPARISON:  03/07/2018 and 03/04/2018 chest x-ray and rib films. 01/11/2018 CT abdomen and pelvis. FINDINGS: CT CHEST FINDINGS Cardiovascular: Pacemaker in place tip in the region of the right ventricle. Heart size top-normal. Coronary artery calcification. Mitral and aortic valve calcification. Atherosclerotic changes thoracic aorta without aneurysm or dissection. Atherosclerotic changes great vessels with mild to slightly moderate narrowing proximal left subclavian artery. No central pulmonary embolus noted. Mediastinum/Nodes: Scattered normal size lymph nodes. No esophageal abnormality or thyroid abnormality noted. Lungs/Pleura: No pneumothorax. Scattered chronic lung changes without worrisome lung mass identified. Trachea and mainstem bronchi patent. Musculoskeletal: Nondisplaced left eighth and ninth rib fracture. No thoracic spine compression fracture although mild degenerative changes noted. Other: Upper left breast 2.7 cm lobulated mass. Correlation with mammography recommended. CT ABDOMEN PELVIS FINDINGS Hepatobiliary: No worrisome hepatic lesion.  Post cholecystectomy. Pancreas: No worrisome pancreatic lesion or inflammation. Spleen: No worrisome splenic mass, injury or enlargement. Adrenals/Urinary Tract:  No obstructing stone or hydronephrosis. Parapelvic cysts and cortical cysts noted. No adrenal mass. Contracted urinary bladder. Stomach/Bowel: Under distended portions of stomach, small bowel and colon. This limits evaluation particularly distal transverse colon and descending colon where there is mild circumferential wall thickening. Mild colitis cannot be excluded. Vascular/Lymphatic: Atherosclerotic changes aorta and aortic branch vessels with areas of narrowing but without evidence of large vessel occlusion or abdominal aortic aneurysm. Narrowing of the origin of the celiac  artery, superior mesenteric artery and renal arteries. Scattered normal size lymph nodes. Reproductive: Post hysterectomy.  No adnexal mass noted. Other: No free air or bowel containing hernia. Mild diastases rectus muscles. Trace free fluid in the pelvis. Musculoskeletal: Degenerative changes lumbar spine without compression fracture. Hip joint degenerative changes without fracture noted. IMPRESSION: 1. Nondisplaced left eighth and ninth rib fracture. No pneumothorax. 2. No evidence of visceral injury. 3. Circumferential thickening distal transverse colon and descending colon. Although this may be partially explained by under distension, colitis cannot be excluded. This may account for trace free fluid in the pelvis. 4. 2.7 cm lobulated mass upper left breast. Correlation with mammography recommended. 5. Pacemaker in place.  Coronary artery calcifications. 6. Aortic Atherosclerosis (ICD10-I70.0). Narrowing of the origin of the celiac artery, superior mesenteric artery and renal arteries. Mild to moderate narrowing origin left subclavian artery. Electronically Signed   By: Genia Del M.D.   On: 03/07/2018 12:03   Ct Abdomen Pelvis W Contrast  Result Date: 03/07/2018 CLINICAL DATA:  84 year old female fell hitting left side 4 days ago. Pain. Subsequent encounter. EXAM: CT CHEST, ABDOMEN, AND PELVIS WITH CONTRAST TECHNIQUE: Multidetector  CT imaging of the chest, abdomen and pelvis was performed following the standard protocol during bolus administration of intravenous contrast. CONTRAST:  175mL ISOVUE-300 IOPAMIDOL (ISOVUE-300) INJECTION 61% COMPARISON:  03/07/2018 and 03/04/2018 chest x-ray and rib films. 01/11/2018 CT abdomen and pelvis. FINDINGS: CT CHEST FINDINGS Cardiovascular: Pacemaker in place tip in the region of the right ventricle. Heart size top-normal. Coronary artery calcification. Mitral and aortic valve calcification. Atherosclerotic changes thoracic aorta without aneurysm or dissection. Atherosclerotic changes great vessels with mild to slightly moderate narrowing proximal left subclavian artery. No central pulmonary embolus noted. Mediastinum/Nodes: Scattered normal size lymph nodes. No esophageal abnormality or thyroid abnormality noted. Lungs/Pleura: No pneumothorax. Scattered chronic lung changes without worrisome lung mass identified. Trachea and mainstem bronchi patent. Musculoskeletal: Nondisplaced left eighth and ninth rib fracture. No thoracic spine compression fracture although mild degenerative changes noted. Other: Upper left breast 2.7 cm lobulated mass. Correlation with mammography recommended. CT ABDOMEN PELVIS FINDINGS Hepatobiliary: No worrisome hepatic lesion.  Post cholecystectomy. Pancreas: No worrisome pancreatic lesion or inflammation. Spleen: No worrisome splenic mass, injury or enlargement. Adrenals/Urinary Tract: No obstructing stone or hydronephrosis. Parapelvic cysts and cortical cysts noted. No adrenal mass. Contracted urinary bladder. Stomach/Bowel: Under distended portions of stomach, small bowel and colon. This limits evaluation particularly distal transverse colon and descending colon where there is mild circumferential wall thickening. Mild colitis cannot be excluded. Vascular/Lymphatic: Atherosclerotic changes aorta and aortic branch vessels with areas of narrowing but without evidence of large  vessel occlusion or abdominal aortic aneurysm. Narrowing of the origin of the celiac artery, superior mesenteric artery and renal arteries. Scattered normal size lymph nodes. Reproductive: Post hysterectomy.  No adnexal mass noted. Other: No free air or bowel containing hernia. Mild diastases rectus muscles. Trace free fluid in the pelvis. Musculoskeletal: Degenerative changes lumbar spine without compression fracture. Hip joint degenerative changes without fracture noted. IMPRESSION: 1. Nondisplaced left eighth and ninth rib fracture. No pneumothorax. 2. No evidence of visceral injury. 3. Circumferential thickening distal transverse colon and descending colon. Although this may be partially explained by under distension, colitis cannot be excluded. This may account for trace free fluid in the pelvis. 4. 2.7 cm lobulated mass upper left breast. Correlation with mammography recommended. 5. Pacemaker in place.  Coronary artery calcifications. 6. Aortic Atherosclerosis (ICD10-I70.0). Narrowing of the origin of the celiac artery, superior  mesenteric artery and renal arteries. Mild to moderate narrowing origin left subclavian artery. Electronically Signed   By: Genia Del M.D.   On: 03/07/2018 12:03    Assessment & Plan:   Problem List Items Addressed This Visit    Atherosclerosis of aorta [facility])    Thoracic aorta without aneurysm, and involving left subclavian, cerliac, SMA and renals. Recommending resuming high potency statin once her pain has resolved. She is willling to try crestor       Relevant Medications   propranolol (INDERAL) 10 MG tablet   rosuvastatin (CRESTOR) 5 MG tablet   Balance problem    Resulting in a fall in bathroom with rib fractures.  Home health Pt ordered       Relevant Orders   Ambulatory referral to Home Health   Breast mass, left    Noted as an Incidental finding on chest CT during ER evaluation for persistent rib pain following a fall with negative plain films. .  I have  confirmed the presence of the mass with Richarda Overlie, MD (radiology) as separate from the pacemaker and not likely to be a hematoma given its location (ribs 3 and 4 ) away from the 8th and 9th posterior rib fractures.  Patient is scheduled for mammogram and ultrasound.  May require biopsy/aspiration,  In which cause she has requested [facility] clinic surgery to do       Constipation    Improved with current reigmen of BFL and stool softener and prune juice. CT abd was done in ER and mild cirucmferential thickening was noted suggestive of mild colitis.       Multiple rib fractures    Secondary to fall against bathroom sink edge.  Pain controlled,  Lungs clear today      Personal history of fall, presenting hazards to health    Home PT ordered         A total of 40 minutes was spent with patient more than half of which was spent in counseling patient on the above mentioned issues , reviewing and explaining recent labs and imaging studies done, and coordination of care.  I am having Payge Sills "[place] Cammi Provencher" start on rosuvastatin and potassium chloride SA. I am also having her maintain her nitroGLYCERIN, aspirin EC, warfarin, psyllium, Vitamin D3, isosorbide mononitrate, hydrochlorothiazide, docusate sodium, glucose blood, ONETOUCH DELICA LANCETS 26E, omeprazole, glipiZIDE, amLODipine, meclizine, albuterol, lisinopril, lidocaine, glucose blood, atenolol, traMADol, glucose blood, and propranolol.  Meds ordered this encounter  Medications  . rosuvastatin (CRESTOR) 5 MG tablet    Sig: Take 1 tablet (5 mg total) by mouth daily.    Dispense:  30 tablet    Refill:  5  . potassium chloride SA (K-DUR,KLOR-CON) 20 MEQ tablet    Sig: Take once daily every other day    Dispense:  30 tablet    Refill:  3    There are no discontinued medications.  Follow-up: No follow-ups on file.   Crecencio Mc, MD

## 2018-03-15 ENCOUNTER — Encounter: Payer: Self-pay | Admitting: Internal Medicine

## 2018-03-15 DIAGNOSIS — S2249XA Multiple fractures of ribs, unspecified side, initial encounter for closed fracture: Secondary | ICD-10-CM | POA: Insufficient documentation

## 2018-03-15 DIAGNOSIS — I700 Atherosclerosis of aorta: Secondary | ICD-10-CM | POA: Insufficient documentation

## 2018-03-15 DIAGNOSIS — R2689 Other abnormalities of gait and mobility: Secondary | ICD-10-CM | POA: Insufficient documentation

## 2018-03-15 DIAGNOSIS — Z9181 History of falling: Secondary | ICD-10-CM | POA: Insufficient documentation

## 2018-03-15 HISTORY — DX: Multiple fractures of ribs, unspecified side, initial encounter for closed fracture: S22.49XA

## 2018-03-15 NOTE — Assessment & Plan Note (Signed)
Secondary to fall against bathroom sink edge.  Pain controlled,  Lungs clear today

## 2018-03-15 NOTE — Assessment & Plan Note (Addendum)
Improved with current reigmen of BFL and stool softener and prune juice. CT abd was done in ER and mild cirucmferential thickening was noted suggestive of mild colitis.

## 2018-03-15 NOTE — Assessment & Plan Note (Signed)
Home PT ordered

## 2018-03-15 NOTE — Assessment & Plan Note (Addendum)
Thoracic aorta without aneurysm, and involving left subclavian, cerliac, SMA and renals. Recommending resuming high potency statin once her pain has resolved. She is willling to try crestor

## 2018-03-15 NOTE — Assessment & Plan Note (Signed)
Resulting in a fall in bathroom with rib fractures.  Home health Pt ordered

## 2018-03-16 DIAGNOSIS — I10 Essential (primary) hypertension: Secondary | ICD-10-CM | POA: Diagnosis not present

## 2018-03-16 DIAGNOSIS — I25118 Atherosclerotic heart disease of native coronary artery with other forms of angina pectoris: Secondary | ICD-10-CM | POA: Diagnosis not present

## 2018-03-16 DIAGNOSIS — I482 Chronic atrial fibrillation, unspecified: Secondary | ICD-10-CM | POA: Diagnosis not present

## 2018-03-16 DIAGNOSIS — R55 Syncope and collapse: Secondary | ICD-10-CM | POA: Diagnosis not present

## 2018-03-16 DIAGNOSIS — I495 Sick sinus syndrome: Secondary | ICD-10-CM | POA: Diagnosis not present

## 2018-03-18 ENCOUNTER — Other Ambulatory Visit: Payer: Self-pay | Admitting: Internal Medicine

## 2018-03-18 MED ORDER — POTASSIUM CHLORIDE 20 MEQ/15ML (10%) PO SOLN: 20.0000 meq | ORAL | 11 refills | Status: DC

## 2018-03-18 NOTE — Progress Notes (Signed)
Mychart message sent.

## 2018-03-21 ENCOUNTER — Ambulatory Visit
Admission: RE | Admit: 2018-03-21 | Discharge: 2018-03-21 | Disposition: A | Discharge status: Home / Self Care | Payer: Medicare Other | Source: Ambulatory Visit | Attending: Internal Medicine | Admitting: Internal Medicine

## 2018-03-21 DIAGNOSIS — R928 Other abnormal and inconclusive findings on diagnostic imaging of breast: Secondary | ICD-10-CM | POA: Diagnosis not present

## 2018-03-21 DIAGNOSIS — N6320 Unspecified lump in the left breast, unspecified quadrant: Secondary | ICD-10-CM | POA: Insufficient documentation

## 2018-03-21 DIAGNOSIS — N6322 Unspecified lump in the left breast, upper inner quadrant: Secondary | ICD-10-CM | POA: Diagnosis not present

## 2018-03-23 ENCOUNTER — Encounter: Payer: Self-pay | Admitting: *Deleted

## 2018-03-23 ENCOUNTER — Telehealth: Payer: Self-pay

## 2018-03-23 DIAGNOSIS — N6320 Unspecified lump in the left breast, unspecified quadrant: Secondary | ICD-10-CM

## 2018-03-23 NOTE — Telephone Encounter (Signed)
Referral in progress to Dr Bary Castilla

## 2018-03-23 NOTE — Telephone Encounter (Signed)
Patient's daughter called stating that Leah Holmes called them stating that they will not schedule the biopsy for the pt because they know you like to refer your pt's out else where. Pt's daughter is needing a referral to Dr. Keith Rake office so they can get the biopsy scheduled.

## 2018-03-23 NOTE — Telephone Encounter (Signed)
Spoke with pt's daughter to let her know that the referral to Dr. Luz Brazen office has been ordered and they should be getting a call from them within the next week. Advised the daughter that if they do not hear anything within a week to please give our office a call so that we can check on the referral.

## 2018-03-31 ENCOUNTER — Ambulatory Visit: Payer: Self-pay

## 2018-03-31 ENCOUNTER — Other Ambulatory Visit: Payer: Self-pay

## 2018-03-31 ENCOUNTER — Ambulatory Visit (INDEPENDENT_AMBULATORY_CARE_PROVIDER_SITE_OTHER): Payer: Medicare Other | Admitting: General Surgery

## 2018-03-31 ENCOUNTER — Encounter: Payer: Self-pay | Admitting: General Surgery

## 2018-03-31 VITALS — BP 118/52 | HR 69 | Resp 16 | Ht 66.0 in | Wt 136.0 lb

## 2018-03-31 DIAGNOSIS — N6322 Unspecified lump in the left breast, upper inner quadrant: Secondary | ICD-10-CM

## 2018-03-31 DIAGNOSIS — C50212 Malignant neoplasm of upper-inner quadrant of left female breast: Secondary | ICD-10-CM | POA: Diagnosis not present

## 2018-03-31 DIAGNOSIS — I208 Other forms of angina pectoris: Secondary | ICD-10-CM | POA: Diagnosis not present

## 2018-03-31 HISTORY — PX: BREAST BIOPSY: SHX20

## 2018-03-31 NOTE — Patient Instructions (Addendum)
The patient is aware to call back for any questions or new concerns.    CARE AFTER BREAST BIOPSY  1. Leave the dressing on that your doctor applied after the biopsy. It is waterproof. You may bathe, shower and/or swim. The dressing can be removed in 3 days, you will see small strips of tape against your skin on the incision. Do not remove these strips they will gradually fall off in about 2-3 weeks. You may use an ice pack on and off for the first 12-24 hours for comfort.  2. You may want to use a gauze,cloth or similar protection in your bra to prevent rubbing against your dressing and incision. This is not necessary, but you may feel more comfortable doing so.  3. It is recommended that you wear a bra day and night to give support to the breast. This will prevent the weight of the breast from pulling on the incision.  4. Your breast may feel hard and lumpy under the incision. Do not be alarmed. This is the underlying stitching of tissue. Softening of this tissue will occur in time.  5. You may have a follow up appointment or phone follow up in one week after your biopsy. The office phone number is [phone].  6. You will notice about a week or two after your office visit that the strips of the tape on your incision will begin to loosen. These may then be removed.  7. Report to your doctor any of the following:  * Severe pain not relieved by your pain medication  *Redness of the incision  * Drainage from the incision  *Fever greater than 101 degrees  

## 2018-03-31 NOTE — Progress Notes (Signed)
Patient ID: Leah Holmes, female   DOB: [birth year], 84 y.o.   MRN: [id number]  Chief Complaint  Patient presents with  . Breast Problem    HPI Leah Holmes is a 84 y.o. female.  who presents for a breast evaluation referred by Dr Derrel Nip. The most recent mammogram and ultrasound was done on 03-21-18. She had a fall up against the bathroom counter 03-07-18 which triggered a CT and then a mammogram.  Patient does not perform regular self breast checks and does not get regular mammograms done.   She states since mammogram she hurts at her pacemaker and upper abdomen. She states she is still bruised on her side and left arm from the fall as well.  She is here with her daughter, Leah Holmes (who she lives with).  Bra size 38 A  HPI  Past Medical History:  Diagnosis Date  . Atrial fibrillation ([place]) 2012  . CHF (congestive heart failure) ([place])   . Colon polyp 2003  . Coronary artery disease   . Diabetes mellitus without complication ([place])   . GERD (gastroesophageal reflux disease)   . Hyperlipidemia   . Hypertension   . Irritable bowel syndrome   . Polyp of colon   . PONV (postoperative nausea and vomiting)    with ether, not recently  . Tremor   . Ulcer     Past Surgical History:  Procedure Laterality Date  . ABDOMINAL ADHESION SURGERY    . ABDOMINAL HYSTERECTOMY    . APPENDECTOMY    . BREAST BIOPSY Right late 80s/early 90s   benign  . BREAST SURGERY Right    biopsy   . CESAREAN SECTION     x 2  . CHOLECYSTECTOMY  [id number]   Dr Burt Knack  . COLONOSCOPY  2003   Dr Theodosia Paling in [place]  . fibroid tumor    . PACEMAKER INSERTION N/A 01/28/2016   Procedure: INSERTION PACEMAKER;  Surgeon: Isaias Cowman, MD;  Location: [facility] ORS;  Service: Cardiovascular;  Laterality: N/A;  . UPPER GI ENDOSCOPY  2003   Dr Theodosia Paling in [place]    Family History  Problem Relation Age of Onset  . Cancer Sister 62       breast  . Breast cancer Maternal Aunt 68  . Breast cancer Cousin   .  Breast cancer Other 39    Social History Social History   Tobacco Use  . Smoking status: Never Smoker  . Smokeless tobacco: Former Systems developer    Types: Snuff  . Tobacco comment: occasionally  Substance Use Topics  . Alcohol use: No  . Drug use: No    Allergies  Allergen Reactions  . Codeine Anaphylaxis  . Penicillins Anaphylaxis    Has patient had a PCN reaction causing immediate rash, facial/tongue/throat swelling, SOB or lightheadedness with hypotension: Yes Has patient had a PCN reaction causing severe rash involving mucus membranes or skin necrosis: No Has patient had a PCN reaction that required hospitalization Yes Has patient had a PCN reaction occurring within the last 10 years: No If all of the above answers are "NO", then may proceed with Cephalosporin use.  . Atorvastatin     Other reaction(s): Unknown  . Clonidine Derivatives Other (See Comments)    Reaction:  Unknown   . Gabapentin Nausea Only  . Losartan Other (See Comments)    Other reaction(s): Other (See Comments) Reaction:  Muscle aches  Reaction:  Muscle aches   . Morphine     Other reaction(s): Unknown  . Morphine  And Related Other (See Comments)    Reaction:  Unknown   . Reglan [Metoclopramide] Other (See Comments)    Reaction:  Tremors   . Hydralazine Anxiety    Current Outpatient Medications  Medication Sig Dispense Refill  . albuterol (PROAIR HFA) 108 (90 Base) MCG/ACT inhaler Inhale 1-2 puffs into the lungs every 4 (four) hours as needed for wheezing or shortness of breath. 1 Inhaler 5  . amLODipine (NORVASC) 2.5 MG tablet Take 1 tablet (2.5 mg total) by mouth daily. 90 tablet 3  . aspirin EC 81 MG tablet Take 81 mg by mouth daily. In am.    . atenolol (TENORMIN) 25 MG tablet Take 1 tablet (25 mg total) by mouth daily. In the afternoon 90 tablet 3  . Cholecalciferol (VITAMIN D3) 1000 UNITS CAPS Take 1,000 Units by mouth daily. In am.    . docusate sodium (COLACE) 50 MG capsule Take 50-100 mg by  mouth daily as needed for mild constipation.     Marland Kitchen glipiZIDE (GLUCOTROL) 5 MG tablet TAKE 1/2 (ONE-HALF) TABLET BY MOUTH ONCE DAILY BEFORE SUPPER 45 tablet 2  . glucose blood (ONE TOUCH ULTRA TEST) test strip USE ONE STRIP TO CHECK GLUCOSE TWICE DAILY 200 each 3  . glucose blood (ONE TOUCH ULTRA TEST) test strip USE 1 STRIP TO CHECK GLUCOSE TWICE DAILY 100 each 2  . glucose blood (ONE TOUCH ULTRA TEST) test strip USE 1 STRIP TO CHECK GLUCOSE TWICE DAILY.DX E11.9 200 each 5  . hydrochlorothiazide (HYDRODIURIL) 25 MG tablet Take 25 mg by mouth every morning.    . isosorbide mononitrate (IMDUR) 60 MG 24 hr tablet Take 60 mg by mouth 2 (two) times daily.    Marland Kitchen lisinopril (PRINIVIL,ZESTRIL) 20 MG tablet TAKE 1 TABLET BY MOUTH TWICE DAILY 180 tablet 1  . meclizine (ANTIVERT) 25 MG tablet TAKE 1 TABLET BY MOUTH THREE TIMES DAILY AS NEEDED 90 tablet 1  . nitroGLYCERIN (NITROSTAT) 0.4 MG SL tablet Place 0.4 mg under the tongue every 5 (five) minutes as needed for chest pain.    Marland Kitchen omeprazole (PRILOSEC) 20 MG capsule TAKE 1 CAPSULE BY MOUTH ONCE DAILY 90 capsule 1  . ONETOUCH DELICA LANCETS 82U MISC USE 1  TO CHECK GLUCOSE TWICE DAILY 200 each 0  . potassium chloride 20 MEQ/15ML (10%) SOLN Take 15 mLs (20 mEq total) by mouth every other day. 225 mL 11  . propranolol (INDERAL) 10 MG tablet   0  . psyllium (METAMUCIL) 58.6 % packet Take 1 packet by mouth daily as needed (for constipation).     . rosuvastatin (CRESTOR) 5 MG tablet Take 1 tablet (5 mg total) by mouth daily. 30 tablet 5  . warfarin (COUMADIN) 2 MG tablet Take 4 mg by mouth daily at 6 PM. 2 mg on Sunday and Wednesday     No current facility-administered medications for this visit.     Review of Systems Review of Systems  Constitutional: Negative.   Respiratory: Positive for shortness of breath.   Gastrointestinal: Positive for abdominal pain.    Blood pressure (!) 118/52, pulse 69, resp. rate 16, height 5\' 6"  (1.676 m), weight 136 lb (61.7  kg), SpO2 97 %.  Physical Exam Physical Exam  Constitutional: She is oriented to person, place, and time. She appears well-developed and well-nourished.  HENT:  Mouth/Throat: Oropharynx is clear and moist.  Eyes: Conjunctivae are normal. No scleral icterus.  Neck: Neck supple.  Cardiovascular: Normal rate and normal heart sounds. An irregular rhythm present.  No peripheral edema.  Pulmonary/Chest: Effort normal and breath sounds normal. Right breast exhibits no inverted nipple, no mass, no nipple discharge, no skin change and no tenderness. Left breast exhibits mass. Left breast exhibits no inverted nipple, no nipple discharge, no skin change and no tenderness.  3 cm mass at 10 o'clock 8 CFN left breast    Lymphadenopathy:    She has no cervical adenopathy.    She has no axillary adenopathy.       Left: No supraclavicular adenopathy present.  Neurological: She is alert and oriented to person, place, and time.  Skin: Skin is warm and dry.  Psychiatric: Her behavior is normal.    Data Reviewed CT chest abdomen and pelvis of March 07, 2018: IMPRESSION: 1. Nondisplaced left eighth and ninth rib fracture. No pneumothorax. 2. No evidence of visceral injury. 3. Circumferential thickening distal transverse colon and descending colon. Although this may be partially explained by under distension, colitis cannot be excluded. This may account for trace free fluid in the pelvis. 4. 2.7 cm lobulated mass upper left breast. Correlation with mammography recommended. 5. Pacemaker in place.  Coronary artery calcifications. 6. Aortic Atherosclerosis (ICD10-I70.0). Narrowing of the origin of the celiac artery, superior mesenteric artery and renal arteries. Mild to moderate narrowing origin left subclavian artery. Bilateral mammogram and left breast ultrasound of March 21, 2018 reviewed.  Solitary mass in the upper inner quadrant of the left breast.  Chest x-ray obtained during the time of  her hospitalization showed no acute changes.  Rib details failed to detect the fractures noted on CT.  Ultrasound examination of the breast was undertaken prior to planned biopsy.  There is a well-defined 2.18 x 2.30 x 2.31 irregular hypoechoic mass that appears to extend at least 2 if not through the pectoralis fascia.  This comes within 5 mm of the skin.  No significant vascular flow on duplex imaging.  The biopsy procedure was reviewed with the patient and her daughter.  10 cc of 0.5% Xylocaine with 0.25% Marcaine with 1 to 200,000 units of epinephrine was utilized and well-tolerated.  ChloraPrep was applied to the skin followed by biopsy with a 14-gauge Bard core biopsy device making use of a lateral to medial approach.  3 samples were obtained.  The patient experienced significant pain with the last sample.  The needle tip did not transverse the medial aspect of the lesion.  Minimal bleeding in spite of her known ongoing anticoagulation.  The skin defect was closed with benzoin and Steri-Strip followed by Telfa and Tegaderm dressing.  Ice pack provided.  Laboratory studies obtained during the time of her hospitalization with her fall showed a PT/INR of 2.67.  CBC at the same date showed hemoglobin of 14.2 with a MCV of 94, white blood cell count 5800, platelet count 230,000.  Basic metabolic panel showed a mild, chronic hyponatremia with a sodium of 131, potassium 3.3 and a post stress blood sugar 188.  Creatinine of 0.6 with an estimated GFR of greater than 60. Comprehensive metabolic panel of March 02, 2018 showed normal liver function studies.     Assessment    Left breast mass likely malignant.    Plan    The patient initially said she did not want to do anything.  We had a long discussion about this that not doing anything does not mean that things are going to change.  With its proximity to the skin this could likely ulcerate in the near future and that would  be a very unpleasant  situation.  At a very minimal antiestrogen therapy would be recommended, assuming receptor positive.  She might well benefit from surgical excision of the mass, with or without sentinel node biopsy.  The goal would certainly be to minimize disturbance of her underlying pacemaker.  The patient's diabetes seems to be under reasonable control.  She has stable coronary disease.  Past history of congestive failure.  Present management with Coumadin for atrial fibrillation (x4 years).  She has taken a fall last month, but her daughter reports this is uncommon and her mother usually uses a four-point cane or a walker more recently.  The patient makes her own bed, cares for the family dog and is certainly mentally awake and alert.  Her father lived age 26 and her mother to age 80.  Results will be available tomorrow, and the patient asked that I contact her daughter with these results.  Approximately 45 minutes were spent during this evaluation, the majority of which was devoted to consultation and coordination of care in light of her multiple medical comorbidities.     HPI, Physical Exam, Assessment and Plan have been scribed under the direction and in the presence of Robert Bellow, MD. Karie Fetch, RN  I have completed the exam and reviewed the above documentation for accuracy and completeness.  I agree with the above.  Haematologist has been used and any errors in dictation or transcription are unintentional.  Hervey Ard, M.D., F.A.C.S.  Forest Gleason Kerrick Miler 03/31/2018, 10:02 AM

## 2018-04-01 ENCOUNTER — Telehealth: Payer: Self-pay | Admitting: General Surgery

## 2018-04-01 NOTE — Telephone Encounter (Signed)
Daughter notified, per patient request.  Invasive ductal cancer. Briefly discussed w/ Med Onc: If node negative and ER +; likely only RX post op would be antiestrogen RX (unless elevated Oncotype DX score). At present, daughter reports mom may be accepting of lumpectomy.  They will call Monday with a decision.  Message sent to cardiology re: need for preop visit there. Will plan to hold coumadin x 4 days secondary to axillary procedure.

## 2018-04-04 ENCOUNTER — Telehealth: Payer: Self-pay | Admitting: *Deleted

## 2018-04-04 ENCOUNTER — Other Ambulatory Visit: Payer: Self-pay | Admitting: *Deleted

## 2018-04-04 ENCOUNTER — Other Ambulatory Visit: Payer: Self-pay | Admitting: General Surgery

## 2018-04-04 DIAGNOSIS — C50912 Malignant neoplasm of unspecified site of left female breast: Secondary | ICD-10-CM

## 2018-04-04 MED ORDER — LIDOCAINE-PRILOCAINE 2.5-2.5 % EX CREA: TOPICAL_CREAM | CUTANEOUS | 0 refills | Status: DC

## 2018-04-04 NOTE — Telephone Encounter (Signed)
Patient's surgery has been scheduled for 04-15-18 at [facility] with Dr. Bary Castilla.  Patient has been asked to stop coumadin 4 days prior to procedure. Last dose will be taken on 04-10-18. It is okay for patient to continue an 81 mg aspirin once daily.   The patient's daughter, Debera Lat, is aware to call the office should they have further questions.

## 2018-04-05 ENCOUNTER — Telehealth: Payer: Self-pay

## 2018-04-05 NOTE — Telephone Encounter (Signed)
The patient's daughter called and she said that the patient will be having her full work up with Dr [name] on 04/20/18. We will need to reschedule her surgery. The patient is rescheduled for surgery with Dr Bary Castilla on 04/22/18. She is aware to stop her Coumadin 4 days prior to that. She is already scheduled for pre admit at the hospital and will return to see Dr Bary Castilla on 04/12/18 for a pre op appointment.

## 2018-04-07 DIAGNOSIS — Z7901 Long term (current) use of anticoagulants: Secondary | ICD-10-CM | POA: Diagnosis not present

## 2018-04-08 ENCOUNTER — Encounter
Admission: RE | Admit: 2018-04-08 | Discharge: 2018-04-08 | Disposition: A | Discharge status: Home / Self Care | Payer: Medicare Other | Source: Ambulatory Visit | Attending: General Surgery | Admitting: General Surgery

## 2018-04-08 ENCOUNTER — Other Ambulatory Visit: Payer: Self-pay

## 2018-04-08 DIAGNOSIS — Z01812 Encounter for preprocedural laboratory examination: Secondary | ICD-10-CM | POA: Insufficient documentation

## 2018-04-08 HISTORY — DX: Other allergic rhinitis: J30.89

## 2018-04-08 HISTORY — DX: Presence of cardiac pacemaker: Z95.0

## 2018-04-08 HISTORY — DX: Dyspnea, unspecified: R06.00

## 2018-04-08 HISTORY — DX: Cardiac arrhythmia, unspecified: I49.9

## 2018-04-08 HISTORY — DX: Tremor, unspecified: R25.1

## 2018-04-08 LAB — COMPREHENSIVE METABOLIC PANEL
ALT: 18 U/L (ref 0–44)
AST: 24 U/L (ref 15–41)
Albumin: 3.8 g/dL (ref 3.5–5.0)
Alkaline Phosphatase: 60 U/L (ref 38–126)
Anion gap: 8 (ref 5–15)
BILIRUBIN TOTAL: 0.8 mg/dL (ref 0.3–1.2)
BUN: 10 mg/dL (ref 8–23)
CHLORIDE: 99 mmol/L (ref 98–111)
CO2: 30 mmol/L (ref 22–32)
Calcium: 9.4 mg/dL (ref 8.9–10.3)
Creatinine, Ser: 0.62 mg/dL (ref 0.44–1.00)
Glucose, Bld: 130 mg/dL — ABNORMAL HIGH (ref 70–99)
Potassium: 4 mmol/L (ref 3.5–5.1)
Sodium: 137 mmol/L (ref 135–145)
TOTAL PROTEIN: 6.7 g/dL (ref 6.5–8.1)

## 2018-04-08 NOTE — Pre-Procedure Instructions (Signed)
Cardiac evaluation with Dr [name] on 04/20/18.

## 2018-04-08 NOTE — Pre-Procedure Instructions (Signed)
Pacemaker form faxed to Dr. Alveria Apley office.

## 2018-04-08 NOTE — Patient Instructions (Signed)
Your procedure is scheduled on: 04/22/18 Fri Report to radiology 1st floor medical mall @ 7:30 AM  mpletely may result in serious medical risk, up to and including death, or upon the discretion of your surgeon and anesthesiologist your surgery may need to be rescheduled.    _x___ 1. Do not eat food after midnight the night before your procedure. You may drink clear liquids up to 2 hours before you are scheduled to arrive at the hospital for your procedure.  Do not drink clear liquids within 2 hours of your scheduled arrival to the hospital.  Clear liquids include  --Water or Apple juice without pulp  --Clear carbohydrate beverage such as ClearFast or Gatorade  --Black Coffee or Clear Tea (No milk, no creamers, do not add anything to                  the coffee or Tea Type 1 and type 2 diabetics should only drink water.   ____Ensure clear carbohydrate drink on the way to the hospital for bariatric patients  ____Ensure clear carbohydrate drink 3 hours before surgery for Dr Dwyane Luo patients if physician instructed.   No gum chewing or hard candies.     __x__ 2. No Alcohol for 24 hours before or after surgery.   __x__3. No Smoking or e-cigarettes for 24 prior to surgery.  Do not use any chewable tobacco products for at least 6 hour prior to surgery   ____  4. Bring all medications with you on the day of surgery if instructed.    __x__ 5. Notify your doctor if there is any change in your medical condition     (cold, fever, infections).    x___6. On the morning of surgery brush your teeth with toothpaste and water.  You may rinse your mouth with mouth wash if you wish.  Do not swallow any toothpaste or mouthwash.   Do not wear jewelry, make-up, hairpins, clips or nail polish.  Do not wear lotions, powders, or perfumes. You may wear deodorant.  Do not shave 48 hours prior to surgery. Men may shave face and neck.  Do not bring valuables to the hospital.    [facility] is not responsible  for any belongings or valuables.               Contacts, dentures or bridgework may not be worn into surgery.  Leave your suitcase in the car. After surgery it may be brought to your room.  For patients admitted to the hospital, discharge time is determined by your                       treatment team.  _  Patients discharged the day of surgery will not be allowed to drive home.  You will need someone to drive you home and stay with you the night of your procedure.    Please read over the following fact sheets that you were given:   [facility] Preparing for Surgery and or MRSA Information   _x___ Take anti-hypertensive listed below, cardiac, seizure, asthma,     anti-reflux and psychiatric medicines. These include:  1. amLODipine (NORVASC) 2.5 MG tablet  2.isosorbide mononitrate (IMDUR) 60 MG 24 hr tablet  3.omeprazole (PRILOSEC) 20 MG capsule  4.  5.  6.  ____Fleets enema or Magnesium Citrate as directed.   _x___ Use CHG Soap or sage wipes as directed on instruction sheet   ____ Use inhalers on the day of  surgery and bring to hospital day of surgery  ____ Stop Metformin and Janumet 2 days prior to surgery.    ____ Take 1/2 of usual insulin dose the night before surgery and none on the morning     surgery.   _x___ Follow recommendations from Cardiologist, Pulmonologist or PCP regarding          stopping Aspirin, Coumadin, Plavix ,Eliquis, Effient, or Pradaxa, and Pletal.  To stop coumadin on Sunday per Dr Bary Castilla  X____Stop Anti-inflammatories such as Advil, Aleve, Ibuprofen, Motrin, Naproxen, Naprosyn, Goodies powders or aspirin products. OK to take Tylenol and                          Celebrex.   _x___ Stop supplements until after surgery.  But may continue Vitamin D, Vitamin B,       and multivitamin.   ____ Bring C-Pap to the hospital.

## 2018-04-09 LAB — CANCER ANTIGEN 27.29: CA 27.29: 14.2 U/mL (ref 0.0–38.6)

## 2018-04-12 ENCOUNTER — Encounter: Payer: Self-pay | Admitting: General Surgery

## 2018-04-12 ENCOUNTER — Ambulatory Visit (INDEPENDENT_AMBULATORY_CARE_PROVIDER_SITE_OTHER): Payer: Medicare Other | Admitting: General Surgery

## 2018-04-12 ENCOUNTER — Other Ambulatory Visit: Payer: Self-pay

## 2018-04-12 VITALS — BP 157/69 | HR 80 | Temp 98.6°F | Resp 18 | Ht 66.0 in | Wt 139.0 lb

## 2018-04-12 DIAGNOSIS — C50912 Malignant neoplasm of unspecified site of left female breast: Secondary | ICD-10-CM | POA: Diagnosis not present

## 2018-04-12 DIAGNOSIS — I208 Other forms of angina pectoris: Secondary | ICD-10-CM | POA: Diagnosis not present

## 2018-04-12 NOTE — Progress Notes (Signed)
Patient ID: Leah Holmes, female   DOB: December 16, 1926, 84 y.o.   MRN: [id number]  Chief Complaint  Patient presents with  . Pre-op Exam    Left breast lumpectomy    HPI Leah Holmes is a 84 y.o. female.  Here today for pre-op appointment for Left breast lumpectomy 04/22/18.  HPI  Past Medical History:  Diagnosis Date  . Atrial fibrillation ([place]) 2012  . CHF (congestive heart failure) ([place])   . Colon polyp 2003  . Coronary artery disease   . Diabetes mellitus without complication ([place])   . Dyspnea   . Dysrhythmia    atrial fib  . Environmental and seasonal allergies   . GERD (gastroesophageal reflux disease)   . Hyperlipidemia   . Hypertension   . Irritable bowel syndrome   . Polyp of colon   . PONV (postoperative nausea and vomiting)    with ether, not recently "woke up in surgery once"  . Presence of permanent cardiac pacemaker   . Tremor   . Tremors of nervous system   . Ulcer     Past Surgical History:  Procedure Laterality Date  . ABDOMINAL ADHESION SURGERY    . ABDOMINAL HYSTERECTOMY    . APPENDECTOMY    . BREAST BIOPSY Right late 80s/early 90s   benign  . BREAST SURGERY Right    biopsy   . CESAREAN SECTION     x 2  . CHOLECYSTECTOMY  [id number]   Dr Burt Knack  . COLONOSCOPY  2003   Dr Theodosia Paling in [place]  . fibroid tumor    . PACEMAKER INSERTION N/A 01/28/2016   Procedure: INSERTION PACEMAKER;  Surgeon: Isaias Cowman, MD;  Location: [facility] ORS;  Service: Cardiovascular;  Laterality: N/A;  . UPPER GI ENDOSCOPY  2003   Dr Theodosia Paling in [place]    Family History  Problem Relation Age of Onset  . Cancer Sister 4       breast  . Breast cancer Maternal Aunt 68  . Breast cancer Cousin   . Breast cancer Other 67    Social History Social History   Tobacco Use  . Smoking status: Never Smoker  . Smokeless tobacco: Former Systems developer    Types: Snuff  . Tobacco comment: occasionally  Substance Use Topics  . Alcohol use: No  . Drug use: No     Allergies  Allergen Reactions  . Codeine Anaphylaxis  . Penicillins Anaphylaxis    Has patient had a PCN reaction causing immediate rash, facial/tongue/throat swelling, SOB or lightheadedness with hypotension: Yes Has patient had a PCN reaction causing severe rash involving mucus membranes or skin necrosis: No Has patient had a PCN reaction that required hospitalization Yes Has patient had a PCN reaction occurring within the last 10 years: No If all of the above answers are "NO", then may proceed with Cephalosporin use.  . Atorvastatin     Other reaction(s): Unknown  . Clonidine Derivatives Other (See Comments)    Reaction:  Unknown   . Gabapentin Nausea Only  . Losartan Other (See Comments)    Other reaction(s): Other (See Comments) Reaction:  Muscle aches  Reaction:  Muscle aches   . Morphine     Other reaction(s): Unknown  . Morphine And Related Other (See Comments)    Reaction:  Unknown   . Reglan [Metoclopramide] Other (See Comments)    Reaction:  Tremors   . Hydralazine Anxiety    Current Outpatient Medications  Medication Sig Dispense Refill  . albuterol (PROAIR HFA)  108 (90 Base) MCG/ACT inhaler Inhale 1-2 puffs into the lungs every 4 (four) hours as needed for wheezing or shortness of breath. 1 Inhaler 5  . amLODipine (NORVASC) 2.5 MG tablet Take 1 tablet (2.5 mg total) by mouth daily. 90 tablet 3  . aspirin EC 81 MG tablet Take 81 mg by mouth daily.     Marland Kitchen atenolol (TENORMIN) 25 MG tablet Take 1 tablet (25 mg total) by mouth daily. In the afternoon (Patient taking differently: Take 25 mg by mouth daily at 3 pm. In the afternoon) 90 tablet 3  . Cholecalciferol (VITAMIN D3) 1000 UNITS CAPS Take 1,000 Units by mouth every morning.     . docusate sodium (COLACE) 100 MG capsule Take 100 mg by mouth daily as needed (for constipation.).    Marland Kitchen glipiZIDE (GLUCOTROL) 5 MG tablet TAKE 1/2 (ONE-HALF) TABLET BY MOUTH ONCE DAILY BEFORE SUPPER (Patient taking differently: Take 2.5  mg by mouth daily after supper. ) 45 tablet 2  . glucose blood (ONE TOUCH ULTRA TEST) test strip USE ONE STRIP TO CHECK GLUCOSE TWICE DAILY 200 each 3  . glucose blood (ONE TOUCH ULTRA TEST) test strip USE 1 STRIP TO CHECK GLUCOSE TWICE DAILY 100 each 2  . glucose blood (ONE TOUCH ULTRA TEST) test strip USE 1 STRIP TO CHECK GLUCOSE TWICE DAILY.DX E11.9 200 each 5  . hydrochlorothiazide (HYDRODIURIL) 25 MG tablet Take 25 mg by mouth daily.     . isosorbide mononitrate (IMDUR) 60 MG 24 hr tablet Take 60 mg by mouth 2 (two) times daily.    Marland Kitchen lidocaine-prilocaine (EMLA) cream Apply to skin of areola one hour prior to coming to the hospital. Cover area with plastic wrap after application. 5 g 0  . lisinopril (PRINIVIL,ZESTRIL) 20 MG tablet TAKE 1 TABLET BY MOUTH TWICE DAILY (Patient taking differently: Take 20 mg by mouth 2 (two) times daily. ) 180 tablet 1  . meclizine (ANTIVERT) 25 MG tablet TAKE 1 TABLET BY MOUTH THREE TIMES DAILY AS NEEDED (Patient taking differently: Take 25 mg by mouth 3 (three) times daily as needed (dizziness). ) 90 tablet 1  . nitroGLYCERIN (NITROSTAT) 0.4 MG SL tablet Place 0.4 mg under the tongue every 5 (five) minutes as needed for chest pain.    Marland Kitchen omeprazole (PRILOSEC) 20 MG capsule TAKE 1 CAPSULE BY MOUTH ONCE DAILY (Patient taking differently: Take 20 mg by mouth at bedtime as needed (indigestion/heartburn). ) 90 capsule 1  . ONETOUCH DELICA LANCETS 35T MISC USE 1  TO CHECK GLUCOSE TWICE DAILY 200 each 0  . Polyethyl Glycol-Propyl Glycol 0.4-0.3 % SOLN Place 1 drop into both eyes 3 (three) times daily as needed (for dry/irritated eyes.).    Marland Kitchen potassium chloride 20 MEQ/15ML (10%) SOLN Take 15 mLs (20 mEq total) by mouth every other day. 225 mL 11  . psyllium (METAMUCIL) 58.6 % packet Take 1 packet by mouth daily as needed (for regularity).     . rosuvastatin (CRESTOR) 5 MG tablet Take 1 tablet (5 mg total) by mouth daily. (Patient taking differently: Take 5 mg by mouth daily  with supper. ) 30 tablet 5  . warfarin (COUMADIN) 2 MG tablet Take 2-4 mg by mouth See admin instructions. Take 2 tablets (4 mg) by mouth on Mondays, Tuesdays, Thursdays, Fridays, Saturdays & Sundays at night. Take 1 tablet (2 mg) by mouth on Sundays & Wednesdays at night.     No current facility-administered medications for this visit.     Review of Systems  Review of Systems  Constitutional: Negative.   Respiratory: Negative.   Cardiovascular: Negative.   Skin: Negative.     Blood pressure (!) 157/69, pulse 80, temperature 98.6 F (37 C), temperature source Temporal, resp. rate 18, height 5\' 6"  (1.676 m), weight 139 lb (63 kg), SpO2 96 %.  Physical Exam Physical Exam  Constitutional: She is oriented to person, place, and time. She appears well-developed and well-nourished.  Eyes: Conjunctivae are normal. No scleral icterus.  Neck: Normal range of motion.  Cardiovascular: Normal rate, regular rhythm and normal heart sounds.  Pulmonary/Chest: Effort normal and breath sounds normal. Right breast exhibits no inverted nipple, no mass, no nipple discharge, no skin change and no tenderness. Left breast exhibits no inverted nipple, no mass, no nipple discharge, no skin change and no tenderness.    Lymphadenopathy:    She has no cervical adenopathy.    She has no axillary adenopathy.       Left: No supraclavicular adenopathy present.  Neurological: She is alert and oriented to person, place, and time.  Skin: Skin is warm and dry.    Data Reviewed Comprehensive metabolic panel dated April 08, 2018 was notable for modest elevation of blood sugar 130.  Normal electrolytes.  Creatinine 0.62.  Estimated GFR greater than 60. CA 27-29: 14.2.  CBC of March 07, 2018: Hemoglobin 14.2 with an MCV of 94, white blood cell count 5800 and a platelet count of 230,000.  Assessment    Left breast cancer, candidate for breast conservation surgery.    Plan    Patient scheduled for surgery  04/22/18.      HPI, Physical Exam, Assessment and Plan have been scribed under the direction and in the presence of Robert Bellow, MD. Jonnie Finner, CMA  Coumadin will be held with the last dose to be administered on April 17, 2018.  I have completed the exam and reviewed the above documentation for accuracy and completeness.  I agree with the above.  Haematologist has been used and any errors in dictation or transcription are unintentional.  Hervey Ard, M.D., F.A.C.S.   Forest Gleason Alona Danford 04/13/2018, 6:03 PM

## 2018-04-12 NOTE — Patient Instructions (Signed)
Patient scheduled for surgery 04/22/18.

## 2018-04-15 ENCOUNTER — Ambulatory Visit: Payer: Medicare Other

## 2018-04-15 DIAGNOSIS — C50919 Malignant neoplasm of unspecified site of unspecified female breast: Secondary | ICD-10-CM

## 2018-04-15 HISTORY — DX: Malignant neoplasm of unspecified site of unspecified female breast: C50.919

## 2018-04-20 DIAGNOSIS — R55 Syncope and collapse: Secondary | ICD-10-CM | POA: Diagnosis not present

## 2018-04-20 DIAGNOSIS — I10 Essential (primary) hypertension: Secondary | ICD-10-CM | POA: Diagnosis not present

## 2018-04-20 DIAGNOSIS — I2510 Atherosclerotic heart disease of native coronary artery without angina pectoris: Secondary | ICD-10-CM | POA: Diagnosis not present

## 2018-04-20 DIAGNOSIS — I25118 Atherosclerotic heart disease of native coronary artery with other forms of angina pectoris: Secondary | ICD-10-CM | POA: Diagnosis not present

## 2018-04-20 DIAGNOSIS — I340 Nonrheumatic mitral (valve) insufficiency: Secondary | ICD-10-CM | POA: Diagnosis not present

## 2018-04-20 DIAGNOSIS — I071 Rheumatic tricuspid insufficiency: Secondary | ICD-10-CM | POA: Diagnosis not present

## 2018-04-20 DIAGNOSIS — I6523 Occlusion and stenosis of bilateral carotid arteries: Secondary | ICD-10-CM | POA: Diagnosis not present

## 2018-04-20 DIAGNOSIS — I495 Sick sinus syndrome: Secondary | ICD-10-CM | POA: Diagnosis not present

## 2018-04-20 DIAGNOSIS — I482 Chronic atrial fibrillation, unspecified: Secondary | ICD-10-CM | POA: Diagnosis not present

## 2018-04-20 NOTE — Patient Instructions (Signed)
Cardiac clearance received from Dr [name]. This has been faxed to pre admit testing.

## 2018-04-22 ENCOUNTER — Ambulatory Visit: Payer: Medicare Other | Admitting: Registered Nurse

## 2018-04-22 ENCOUNTER — Encounter: Payer: Self-pay | Admitting: *Deleted

## 2018-04-22 ENCOUNTER — Ambulatory Visit
Admission: RE | Admit: 2018-04-22 | Discharge: 2018-04-22 | Disposition: A | Discharge status: Home / Self Care | Payer: Medicare Other | Source: Ambulatory Visit | Attending: General Surgery | Admitting: General Surgery

## 2018-04-22 ENCOUNTER — Encounter
Admission: RE | Disposition: A | Discharge status: Home / Self Care | Payer: Self-pay | Source: Ambulatory Visit | Attending: General Surgery

## 2018-04-22 ENCOUNTER — Other Ambulatory Visit: Payer: Self-pay

## 2018-04-22 DIAGNOSIS — I509 Heart failure, unspecified: Secondary | ICD-10-CM | POA: Diagnosis not present

## 2018-04-22 DIAGNOSIS — K219 Gastro-esophageal reflux disease without esophagitis: Secondary | ICD-10-CM | POA: Diagnosis not present

## 2018-04-22 DIAGNOSIS — I110 Hypertensive heart disease with heart failure: Secondary | ICD-10-CM | POA: Insufficient documentation

## 2018-04-22 DIAGNOSIS — Z87891 Personal history of nicotine dependence: Secondary | ICD-10-CM | POA: Diagnosis not present

## 2018-04-22 DIAGNOSIS — K589 Irritable bowel syndrome without diarrhea: Secondary | ICD-10-CM | POA: Diagnosis not present

## 2018-04-22 DIAGNOSIS — C50912 Malignant neoplasm of unspecified site of left female breast: Secondary | ICD-10-CM | POA: Insufficient documentation

## 2018-04-22 DIAGNOSIS — Z7901 Long term (current) use of anticoagulants: Secondary | ICD-10-CM | POA: Insufficient documentation

## 2018-04-22 DIAGNOSIS — Z885 Allergy status to narcotic agent status: Secondary | ICD-10-CM | POA: Insufficient documentation

## 2018-04-22 DIAGNOSIS — Z7982 Long term (current) use of aspirin: Secondary | ICD-10-CM | POA: Diagnosis not present

## 2018-04-22 DIAGNOSIS — E785 Hyperlipidemia, unspecified: Secondary | ICD-10-CM | POA: Insufficient documentation

## 2018-04-22 DIAGNOSIS — C50212 Malignant neoplasm of upper-inner quadrant of left female breast: Secondary | ICD-10-CM | POA: Insufficient documentation

## 2018-04-22 DIAGNOSIS — I4891 Unspecified atrial fibrillation: Secondary | ICD-10-CM | POA: Insufficient documentation

## 2018-04-22 DIAGNOSIS — Z880 Allergy status to penicillin: Secondary | ICD-10-CM | POA: Diagnosis not present

## 2018-04-22 DIAGNOSIS — I2510 Atherosclerotic heart disease of native coronary artery without angina pectoris: Secondary | ICD-10-CM | POA: Insufficient documentation

## 2018-04-22 DIAGNOSIS — E119 Type 2 diabetes mellitus without complications: Secondary | ICD-10-CM | POA: Insufficient documentation

## 2018-04-22 DIAGNOSIS — Z803 Family history of malignant neoplasm of breast: Secondary | ICD-10-CM | POA: Diagnosis not present

## 2018-04-22 DIAGNOSIS — Z950 Presence of cardiac pacemaker: Secondary | ICD-10-CM | POA: Diagnosis not present

## 2018-04-22 HISTORY — PX: BREAST LUMPECTOMY: SHX2

## 2018-04-22 HISTORY — PX: SENTINEL NODE BIOPSY: SHX6608

## 2018-04-22 LAB — GLUCOSE, CAPILLARY
GLUCOSE-CAPILLARY: 152 mg/dL — AB (ref 70–99)
Glucose-Capillary: 121 mg/dL — ABNORMAL HIGH (ref 70–99)

## 2018-04-22 LAB — PROTIME-INR
INR: 1
Prothrombin Time: 13.1 seconds (ref 11.4–15.2)

## 2018-04-22 SURGERY — BREAST LUMPECTOMY
Anesthesia: General | Laterality: Left

## 2018-04-22 MED ORDER — ACETAMINOPHEN 10 MG/ML IV SOLN
INTRAVENOUS | Status: DC | PRN
  Administered 2018-04-22: 1000 mg via INTRAVENOUS

## 2018-04-22 MED ORDER — FENTANYL CITRATE (PF) 100 MCG/2ML IJ SOLN
25.0000 ug | INTRAMUSCULAR | Status: DC | PRN
  Administered 2018-04-22 (×3): 25 ug via INTRAVENOUS

## 2018-04-22 MED ORDER — EPHEDRINE SULFATE 50 MG/ML IJ SOLN
INTRAMUSCULAR | Status: AC
  Filled 2018-04-22: qty 1

## 2018-04-22 MED ORDER — TECHNETIUM TC 99M SULFUR COLLOID FILTERED
0.7040 | Freq: Once | INTRAVENOUS | Status: AC | PRN
  Administered 2018-04-22: 0.704 via INTRADERMAL

## 2018-04-22 MED ORDER — LIDOCAINE HCL (CARDIAC) PF 100 MG/5ML IV SOSY
PREFILLED_SYRINGE | INTRAVENOUS | Status: DC | PRN
  Administered 2018-04-22: 60 mg via INTRAVENOUS

## 2018-04-22 MED ORDER — FENTANYL CITRATE (PF) 100 MCG/2ML IJ SOLN
INTRAMUSCULAR | Status: DC | PRN
  Administered 2018-04-22 (×4): 25 ug via INTRAVENOUS

## 2018-04-22 MED ORDER — DEXAMETHASONE SODIUM PHOSPHATE 10 MG/ML IJ SOLN
INTRAMUSCULAR | Status: AC
  Filled 2018-04-22: qty 1

## 2018-04-22 MED ORDER — TECHNETIUM TC 99M TETROFOSMIN IV KIT: 0.7040 | PACK | Freq: Once | INTRAVENOUS | Status: DC | PRN

## 2018-04-22 MED ORDER — ONDANSETRON HCL 4 MG/2ML IJ SOLN
INTRAMUSCULAR | Status: AC
  Filled 2018-04-22: qty 2

## 2018-04-22 MED ORDER — ATROPINE SULFATE 0.4 MG/ML IJ SOLN
INTRAMUSCULAR | Status: AC
  Filled 2018-04-22: qty 1

## 2018-04-22 MED ORDER — GLYCOPYRROLATE 0.2 MG/ML IJ SOLN
INTRAMUSCULAR | Status: AC
  Filled 2018-04-22: qty 1

## 2018-04-22 MED ORDER — FENTANYL CITRATE (PF) 100 MCG/2ML IJ SOLN
INTRAMUSCULAR | Status: AC
  Filled 2018-04-22: qty 2

## 2018-04-22 MED ORDER — FENTANYL CITRATE (PF) 100 MCG/2ML IJ SOLN
INTRAMUSCULAR | Status: AC
  Administered 2018-04-22: 25 ug via INTRAVENOUS
  Filled 2018-04-22: qty 2

## 2018-04-22 MED ORDER — ONDANSETRON HCL 4 MG/2ML IJ SOLN: 4.0000 mg | Freq: Once | INTRAMUSCULAR | Status: DC | PRN

## 2018-04-22 MED ORDER — BUPIVACAINE-EPINEPHRINE (PF) 0.5% -1:200000 IJ SOLN
INTRAMUSCULAR | Status: DC | PRN
  Administered 2018-04-22: 30 mL

## 2018-04-22 MED ORDER — SODIUM CHLORIDE 0.9 % IV SOLN
INTRAVENOUS | Status: DC
  Administered 2018-04-22: 09:00:00 via INTRAVENOUS

## 2018-04-22 MED ORDER — BUPIVACAINE-EPINEPHRINE (PF) 0.5% -1:200000 IJ SOLN
INTRAMUSCULAR | Status: AC
  Filled 2018-04-22: qty 30

## 2018-04-22 MED ORDER — TRAMADOL HCL 50 MG PO TABS
ORAL_TABLET | ORAL | Status: AC
  Administered 2018-04-22: 50 mg via ORAL
  Filled 2018-04-22: qty 1

## 2018-04-22 MED ORDER — METHYLENE BLUE 0.5 % INJ SOLN
INTRAVENOUS | Status: AC
  Filled 2018-04-22: qty 10

## 2018-04-22 MED ORDER — TRAMADOL HCL 50 MG PO TABS: 50.0000 mg | ORAL_TABLET | Freq: Four times a day (QID) | ORAL | 0 refills | Status: DC | PRN

## 2018-04-22 MED ORDER — PROPOFOL 10 MG/ML IV BOLUS
INTRAVENOUS | Status: AC
  Filled 2018-04-22: qty 20

## 2018-04-22 MED ORDER — DEXAMETHASONE SODIUM PHOSPHATE 10 MG/ML IJ SOLN
INTRAMUSCULAR | Status: DC | PRN
  Administered 2018-04-22: 10 mg via INTRAVENOUS

## 2018-04-22 MED ORDER — ONDANSETRON HCL 4 MG/2ML IJ SOLN
INTRAMUSCULAR | Status: DC | PRN
  Administered 2018-04-22: 4 mg via INTRAVENOUS

## 2018-04-22 MED ORDER — ACETAMINOPHEN 10 MG/ML IV SOLN
INTRAVENOUS | Status: AC
  Filled 2018-04-22: qty 100

## 2018-04-22 MED ORDER — TRAMADOL HCL 50 MG PO TABS
50.0000 mg | ORAL_TABLET | Freq: Four times a day (QID) | ORAL | Status: DC | PRN
  Administered 2018-04-22: 50 mg via ORAL

## 2018-04-22 MED ORDER — METHYLENE BLUE 0.5 % INJ SOLN
INTRAVENOUS | Status: DC | PRN
  Administered 2018-04-22: 5 mL

## 2018-04-22 MED ORDER — PROPOFOL 10 MG/ML IV BOLUS
INTRAVENOUS | Status: DC | PRN
  Administered 2018-04-22: 120 mg via INTRAVENOUS

## 2018-04-22 SURGICAL SUPPLY — 54 items
BINDER BREAST LRG (GAUZE/BANDAGES/DRESSINGS) IMPLANT
BINDER BREAST MEDIUM (GAUZE/BANDAGES/DRESSINGS) ×3 IMPLANT
BINDER BREAST XLRG (GAUZE/BANDAGES/DRESSINGS) IMPLANT
BLADE PHOTON ILLUMINATED (MISCELLANEOUS) IMPLANT
BLADE SURG 15 STRL SS SAFETY (BLADE) ×3 IMPLANT
BULB RESERV EVAC DRAIN JP 100C (MISCELLANEOUS) IMPLANT
CANISTER SUCT 1200ML W/VALVE (MISCELLANEOUS) ×3 IMPLANT
CHLORAPREP W/TINT 26ML (MISCELLANEOUS) ×3 IMPLANT
CLOSURE WOUND 1/2 X4 (GAUZE/BANDAGES/DRESSINGS) ×1
CNTNR SPEC 2.5X3XGRAD LEK (MISCELLANEOUS) ×1
CONT SPEC 4OZ STER OR WHT (MISCELLANEOUS) ×2
CONTAINER SPEC 2.5X3XGRAD LEK (MISCELLANEOUS) ×1 IMPLANT
COVER PROBE FLX POLY STRL (MISCELLANEOUS) ×3 IMPLANT
COVER WAND RF STERILE (DRAPES) IMPLANT
DEVICE DUBIN SPECIMEN MAMMOGRA (MISCELLANEOUS) IMPLANT
DRAIN CHANNEL JP 15F RND 16 (MISCELLANEOUS) IMPLANT
DRAPE LAPAROTOMY TRNSV 106X77 (MISCELLANEOUS) ×3 IMPLANT
DRSG GAUZE FLUFF 36X18 (GAUZE/BANDAGES/DRESSINGS) ×6 IMPLANT
DRSG TELFA 3X8 NADH (GAUZE/BANDAGES/DRESSINGS) ×3 IMPLANT
DRSG TELFA 4X3 1S NADH ST (GAUZE/BANDAGES/DRESSINGS) ×3 IMPLANT
ELECT CAUTERY BLADE TIP 2.5 (TIP) ×3
ELECT REM PT RETURN 9FT ADLT (ELECTROSURGICAL) ×3
ELECTRODE CAUTERY BLDE TIP 2.5 (TIP) ×1 IMPLANT
ELECTRODE REM PT RTRN 9FT ADLT (ELECTROSURGICAL) ×1 IMPLANT
GLOVE BIO SURGEON STRL SZ7.5 (GLOVE) ×9 IMPLANT
GLOVE INDICATOR 8.0 STRL GRN (GLOVE) ×9 IMPLANT
GOWN STRL REUS W/ TWL LRG LVL3 (GOWN DISPOSABLE) ×3 IMPLANT
GOWN STRL REUS W/TWL LRG LVL3 (GOWN DISPOSABLE) ×6
KIT TURNOVER KIT A (KITS) ×3 IMPLANT
LABEL OR SOLS (LABEL) ×3 IMPLANT
MARGIN MAP 10MM (MISCELLANEOUS) ×3 IMPLANT
NDL SAFETY ECLIPSE 18X1.5 (NEEDLE) ×1 IMPLANT
NEEDLE HYPO 18GX1.5 SHARP (NEEDLE) ×2
NEEDLE HYPO 22GX1.5 SAFETY (NEEDLE) ×3 IMPLANT
NEEDLE HYPO 25X1 1.5 SAFETY (NEEDLE) ×3 IMPLANT
PACK BASIN MINOR ARMC (MISCELLANEOUS) ×3 IMPLANT
SHEARS FOC LG CVD HARMONIC 17C (MISCELLANEOUS) IMPLANT
SHEARS HARMONIC 9CM CVD (BLADE) IMPLANT
SLEVE PROBE SENORX GAMMA FIND (MISCELLANEOUS) ×3 IMPLANT
STRIP CLOSURE SKIN 1/2X4 (GAUZE/BANDAGES/DRESSINGS) ×2 IMPLANT
SUT ETHILON 3-0 FS-10 30 BLK (SUTURE) ×3
SUT SILK 2 0 (SUTURE) ×2
SUT SILK 2-0 18XBRD TIE 12 (SUTURE) ×1 IMPLANT
SUT VIC AB 2-0 CT1 27 (SUTURE) ×6
SUT VIC AB 2-0 CT1 TAPERPNT 27 (SUTURE) ×3 IMPLANT
SUT VIC AB 4-0 FS2 27 (SUTURE) ×6 IMPLANT
SUT VICRYL+ 3-0 144IN (SUTURE) ×3 IMPLANT
SUTURE EHLN 3-0 FS-10 30 BLK (SUTURE) ×1 IMPLANT
SWABSTK COMLB BENZOIN TINCTURE (MISCELLANEOUS) ×3 IMPLANT
SYR 10ML LL (SYRINGE) ×3 IMPLANT
SYR BULB IRRIG 60ML STRL (SYRINGE) ×3 IMPLANT
SYR CONTROL 10ML (SYRINGE) IMPLANT
TAPE TRANSPORE STRL 2 31045 (GAUZE/BANDAGES/DRESSINGS) IMPLANT
WATER STERILE IRR 1000ML POUR (IV SOLUTION) ×3 IMPLANT

## 2018-04-22 NOTE — Discharge Instructions (Addendum)
Lumpectomy, Care After This sheet gives you information about how to care for yourself after your procedure. Your health care provider may also give you more specific instructions. If you have problems or questions, contact your health care provider. What can I expect after the procedure? After the procedure, it is common to have:  Breast swelling.  Breast tenderness.  Stiffness in your arm or shoulder.  A change in the shape and feel of your breast.  Scar tissue that feels hard to the touch in the area where the lump was removed.  Follow these instructions at home: Bathing  Take sponge baths until your health care provider says that you can start showering or bathing.  Do not take baths, swim, or use a hot tub until your health care provider approves. Incision care  Follow instructions from your health care provider about how to take care of your incision. Make sure you: ? Wash your hands with soap and water before you change your bandage (dressing). If soap and water are not available, use hand sanitizer. ? Change your dressing as told by your health care provider. ? Leave stitches (sutures), skin glue, or adhesive strips in place. These skin closures may need to stay in place for 2 weeks or longer. If adhesive strip edges start to loosen and curl up, you may trim the loose edges. Do not remove adhesive strips completely unless your health care provider tells you to do that.  Check your incision area every day for signs of infection. Check for: ? More redness, swelling, or pain. ? More fluid or blood. ? Warmth. ? Pus or a bad smell.  Keep your dressing clean and dry.  If you were sent home with a surgical drain in place, follow instructions from your health care provider about emptying it. Activity  Return to your normal activities as told by your health care provider. Ask your health care provider what activities are safe for you.  Avoid activities that require a lot of  energy (are strenuous).  Be careful to avoid any activities that could cause an injury to your arm on the side of your surgery.  Do not lift anything that is heavier than 10 lb (4.5 kg). Avoid lifting with the arm that is on the side of your surgery.  Do not carry heavy objects on your shoulder.  After your drain is removed, you should perform exercises to keep your arm from getting stiff and swollen. Talk with your health care provider about which exercises are safe for you. General instructions  Take over-the-counter and prescription medicines only as told by your health care provider.  You may eat what you usually do.  Wear a supportive bra as told by your health care provider.  Raise (elevate) your arm above the level of your heart while you are sitting or lying down.  Do not wear tight jewelry on your arm, wrist, or fingers on the side of your surgery. Follow-up  Keep all follow-up visits as told by your health care provider. This is important.  You may need to be screened for extra fluid around the lymph nodes (lymphedema). Follow instructions from your health care provider about how often you should be checked.  If you had any lymph nodes removed during your procedure, be sure to tell all of your health care providers. This is important information to share before you are involved in certain procedures, such as giving blood or having your blood pressure taken. Contact a health care  provider if:  You develop a rash.  You have a fever.  Your pain medicine is not working.  Your swelling, weakness, or numbness in your arm has not improved after a few weeks.  You have new swelling in your breast or arm.  You have more redness, swelling, or pain in your incision area.  You have more fluid or blood coming from your incision.  Your incision feels warm to the touch.  You have pus or a bad smell coming from your incision. Get help right away if:  You have very bad pain in  your breast or arm.  You have chest pain.  You have difficulty breathing. This information is not intended to replace advice given to you by your health care provider. Make sure you discuss any questions you have with your health care provider. Document Released: 06/17/2006 Document Revised: 02/12/2016 Document Reviewed: 02/12/2016 Elsevier Interactive Patient Education  2018 [place]   1) The drugs that you were given will stay in your system until tomorrow so for the next 24 hours you should not:  A) Drive an automobile B) Make any legal decisions C) Drink any alcoholic beverage   2) You may resume regular meals tomorrow.  Today it is better to start with liquids and gradually work up to solid foods.  You may eat anything you prefer, but it is better to start with liquids, then soup and crackers, and gradually work up to solid foods.   3) Please notify your doctor immediately if you have any unusual bleeding, trouble breathing, redness and pain at the surgery site, drainage, fever, or pain not relieved by medication. 4)   5) Your post-operative visit with Dr.                                     is: Date:                        Time:    Please call to schedule your post-operative visit.  6) Additional Instructions:   AMBULATORY SURGERY  DISCHARGE INSTRUCTIONS   7) The drugs that you were given will stay in your system until tomorrow so for the next 24 hours you should not:  D) Drive an automobile E) Make any legal decisions F) Drink any alcoholic beverage   8) You may resume regular meals tomorrow.  Today it is better to start with liquids and gradually work up to solid foods.  You may eat anything you prefer, but it is better to start with liquids, then soup and crackers, and gradually work up to solid foods.   9) Please notify your doctor immediately if you have any unusual bleeding, trouble breathing, redness and  pain at the surgery site, drainage, fever, or pain not relieved by medication.    10) Additional Instructions:        Please contact your physician with any problems or Same Day Surgery at [phone], Monday through Friday 6 am to 4 pm, or [place] at [facility] number at [phone].

## 2018-04-22 NOTE — Anesthesia Preprocedure Evaluation (Signed)
Anesthesia Evaluation  Patient identified by MRN, date of birth, ID band Patient awake    Reviewed: Allergy & Precautions, H&P , NPO status , Patient's Chart, lab work & pertinent test results, reviewed documented beta blocker date and time   History of Anesthesia Complications (+) PONV and history of anesthetic complications  Airway Mallampati: II  TM Distance: >3 FB Neck ROM: full    Dental  (+) Upper Dentures, Lower Dentures, Edentulous Upper, Edentulous Lower   Pulmonary neg pulmonary ROS,           Cardiovascular Exercise Tolerance: Good hypertension, (-) angina+ CAD, +CHF and + DOE  (-) Past MI, (-) Cardiac Stents and (-) CABG + dysrhythmias Atrial Fibrillation + pacemaker (-) Valvular Problems/Murmurs     Neuro/Psych PSYCHIATRIC DISORDERS Anxiety Depression negative neurological ROS     GI/Hepatic Neg liver ROS, GERD  ,  Endo/Other  diabetes  Renal/GU negative Renal ROS  negative genitourinary   Musculoskeletal   Abdominal   Peds  Hematology negative hematology ROS (+)   Anesthesia Other Findings Past Medical History: 2012: Atrial fibrillation ([facility]) No date: CHF (congestive heart failure) ([place]) 2003: Colon polyp No date: Coronary artery disease No date: Diabetes mellitus without complication ([facility]) No date: Dyspnea No date: Dysrhythmia     Comment:  atrial fib No date: Environmental and seasonal allergies No date: GERD (gastroesophageal reflux disease) No date: Hyperlipidemia No date: Hypertension No date: Irritable bowel syndrome No date: Polyp of colon No date: PONV (postoperative nausea and vomiting)     Comment:  with ether, not recently "woke up in surgery once" No date: Presence of permanent cardiac pacemaker No date: Tremor No date: Tremors of nervous system No date: Ulcer   Reproductive/Obstetrics negative OB ROS                             Anesthesia  Physical Anesthesia Plan  ASA: III  Anesthesia Plan: General   Post-op Pain Management:    Induction: Intravenous  PONV Risk Score and Plan: 4 or greater and Ondansetron, Dexamethasone and Treatment may vary due to age or medical condition  Airway Management Planned: Oral ETT  Additional Equipment:   Intra-op Plan:   Post-operative Plan: Extubation in OR  Informed Consent: I have reviewed the patients History and Physical, chart, labs and discussed the procedure including the risks, benefits and alternatives for the proposed anesthesia with the patient or authorized representative who has indicated his/her understanding and acceptance.   Dental Advisory Given  Plan Discussed with: Anesthesiologist, CRNA and Surgeon  Anesthesia Plan Comments:         Anesthesia Quick Evaluation

## 2018-04-22 NOTE — H&P (Addendum)
No interval change in clinical condition or exam.   For left breast cancer excision and SLN biopsy.

## 2018-04-22 NOTE — OR Nursing (Signed)
Dr Bary Castilla in to see patient, patient may be discharged per md.

## 2018-04-22 NOTE — Op Note (Signed)
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  Postoperative diagnosis: Same.  Operative procedure: Wide local excision of left upper inner quadrant breast cancer with tissue transfer to close the primary defect, sentinel node biopsy.  Operating Surgeon: Hervey Ard, MD.  Anesthesia: General by LMA.  Marcaine 0.5% with 1 to 200,000 units of epinephrine, 30 cc.  Estimated blood loss: 10 cc.  Clinical note: This 84 year old woman had a long-standing an enlarging mass in the upper inner quadrant of the left breast which only recently came to medical attention.  Core biopsy showed evidence of invasive mammary carcinoma.  She was encouraged to at least complete local resection to minimize ongoing progressive disease and possible skin ulceration.  Informal consultation with medical oncology suggested sentinel node biopsy be completed as this might significantly change recommendations for adjuvant treatment based on the tumor size.  The patient was injected with technetium sulfur colloid prior to the procedure which she tolerated well.  Operative note: The patient had SCD stockings for DVT prevention.  She underwent general anesthesia without difficulty.  The skin was cleansed with alcohol and 5 cc of 0.5% methylene blue was instilled in the subareolar plexus.  The breast chest and axilla was then cleansed with ChloraPrep and draped.  Local anesthesia was infiltrated for postoperative comfort.  Due to the close proximity of the tumor mass to the underlying dermis it was elected to resect a 2 x 7 cm ellipse of skin overlying the tumor mass.  This was transversely orientated at the periphery of the 10 o'clock position.  Ultrasound was used to outline the borders of the mass for resection.  The skin was incised sharply and the remaining dissection completed with electrocautery.  Flaps were elevated off the overlying mass and extended for approximately L3-4  centimeter distance cranial/caudal to encompass the entire mass.  On preoperative ultrasound the lesion appeared to involve the pectoralis fashion for that reason the muscle under the lesion was resected with the specimen.   While the breast specimen was being processed attention was turned to the axilla.  The node seeker device was used to identify the area of increased uptake in the inferior anterior aspect of the axilla.  Local anesthesia was infiltrated.  A transverse incision was made.  Hemostasis was with electrocautery and 3-0 Vicryl ties.  The axillary envelope was opened and a blue lymphatic identified.  This was followed to a single 6 mm hot, blue node.  Scanning through the remaining axilla showed no other clear areas of increased uptake.  The axillary envelope was closed with 2-0 Vicryl figure-of-eight sutures.  The adipose layer was closed in similar fashion.  The skin was closed with a running 4-0 Vicryl subcuticular suture.  The pathologist reported the margins were likely closely clear but requested a shave biopsy of the superior margin.  This margin was grasped and additional 5 mm of tissue removed.  The new superior margin was sutured to a piece of Telfa, new margin on the Telfa.  Appropriate orientation markers were applied and the specimen was placed in formalin for routine histology.  Hemostasis had been obtained with electrocautery and 3-0 Vicryl ties.  The pectoralis fascia was elevated off the underlying muscle to the inframammary fold inferiorly, to the sternum medially, to below the clavicle superiorly and lateral to the edge of the areola.  This allowed the soft tissue defect reapproximated with interrupted 2-0 Vicryl figure-of-eight sutures in layers.  The adipose layer was approximated in  a similar fashion.  The skin was closed with a running 4-0 Vicryl subcuticular suture.  There was a modest dimple at the superior aspect of the small volume of adipose tissue and absent breast  tissue above the level where the tumor was resected.    Benzoin Steri-Strips followed by fluff gauze and a compressive wrap were applied.  The patient tolerated the procedure well was taken to recovery room in stable condition.

## 2018-04-22 NOTE — Anesthesia Post-op Follow-up Note (Signed)
Anesthesia QCDR form completed.        

## 2018-04-22 NOTE — Anesthesia Procedure Notes (Signed)
Procedure Name: LMA Insertion Date/Time: 04/22/2018 9:47 AM Performed by: Doreen Salvage, CRNA Pre-anesthesia Checklist: Patient identified, Patient being monitored, Timeout performed, Emergency Drugs available and Suction available Patient Re-evaluated:Patient Re-evaluated prior to induction Oxygen Delivery Method: Circle system utilized Preoxygenation: Pre-oxygenation with 100% oxygen Induction Type: IV induction Ventilation: Mask ventilation without difficulty LMA: LMA inserted LMA Size: 3.5 Tube type: Oral Number of attempts: 1 Placement Confirmation: positive ETCO2 and breath sounds checked- equal and bilateral Tube secured with: Tape Dental Injury: Teeth and Oropharynx as per pre-operative assessment

## 2018-04-22 NOTE — Transfer of Care (Signed)
Immediate Anesthesia Transfer of Care Note  Patient: Leah Holmes  Procedure(s) Performed: Procedure(s): BREAST LUMPECTOMY (Left) SENTINEL NODE BIOPSY (Left)  Patient Location: PACU  Anesthesia Type:General  Level of Consciousness: sedated  Airway & Oxygen Therapy: Patient Spontanous Breathing and Patient connected to face mask oxygen  Post-op Assessment: Report given to RN and Post -op Vital signs reviewed and stable  Post vital signs: Reviewed and stable  Last Vitals:  Vitals:   04/22/18 0857 04/22/18 1124  BP: (!) 194/87 (!) 166/68  Pulse: 61 73  Resp: 16 12  Temp: (!) 35.9 C (!) 36.3 C  SpO2: 830% 735%    Complications: No apparent anesthesia complications

## 2018-04-23 NOTE — Anesthesia Postprocedure Evaluation (Signed)
Anesthesia Post Note  Patient: Leah Holmes  Procedure(s) Performed: BREAST LUMPECTOMY (Left ) SENTINEL NODE BIOPSY (Left )  Patient location during evaluation: PACU Anesthesia Type: General Level of consciousness: awake and alert Pain management: pain level controlled Vital Signs Assessment: post-procedure vital signs reviewed and stable Respiratory status: spontaneous breathing, nonlabored ventilation, respiratory function stable and patient connected to nasal cannula oxygen Cardiovascular status: blood pressure returned to baseline and stable Postop Assessment: no apparent nausea or vomiting Anesthetic complications: no     Last Vitals:  Vitals:   04/22/18 1322 04/22/18 1437  BP: (!) 180/78 (!) 182/81  Pulse:  81  Resp: 16 16  Temp:    SpO2: 99% 99%    Last Pain:  Vitals:   04/22/18 1437  TempSrc:   PainSc: 6                  Martha Clan

## 2018-04-24 ENCOUNTER — Encounter: Payer: Self-pay | Admitting: General Surgery

## 2018-04-26 ENCOUNTER — Encounter: Payer: Self-pay | Admitting: General Surgery

## 2018-04-26 ENCOUNTER — Ambulatory Visit (INDEPENDENT_AMBULATORY_CARE_PROVIDER_SITE_OTHER): Payer: Medicare Other | Admitting: General Surgery

## 2018-04-26 VITALS — BP 175/75 | HR 68 | Temp 97.9°F | Resp 18 | Ht 66.0 in | Wt 139.0 lb

## 2018-04-26 DIAGNOSIS — C50912 Malignant neoplasm of unspecified site of left female breast: Secondary | ICD-10-CM

## 2018-04-26 LAB — SURGICAL PATHOLOGY

## 2018-04-26 NOTE — Progress Notes (Signed)
Patient ID: Leah Holmes, female   DOB: 1926-07-14, 84 y.o.   MRN: [id number]  Chief Complaint  Patient presents with  . Routine Post Op    4 day post op left breast lumpectomy sx 04/22/18    HPI Leah Holmes is a 84 y.o. female here today fo 4 day post op left breast lumpectomy/SNB sx 04/22/18. Patient states she has had some discomfort and feels awful states she had some stinging in the area as well.. She is here with her daughter [name].  HPI  Past Medical History:  Diagnosis Date  . Atrial fibrillation ([place]) 2012  . CHF (congestive heart failure) ([place])   . Colon polyp 2003  . Coronary artery disease   . Diabetes mellitus without complication ([place])   . Dyspnea   . Dysrhythmia    atrial fib  . Environmental and seasonal allergies   . GERD (gastroesophageal reflux disease)   . Hyperlipidemia   . Hypertension   . Irritable bowel syndrome   . Polyp of colon   . PONV (postoperative nausea and vomiting)    with ether, not recently "woke up in surgery once"  . Presence of permanent cardiac pacemaker   . Tremor   . Tremors of nervous system   . Ulcer     Past Surgical History:  Procedure Laterality Date  . ABDOMINAL ADHESION SURGERY    . ABDOMINAL HYSTERECTOMY    . APPENDECTOMY    . BREAST BIOPSY Right late 80s/early 90s   benign  . BREAST LUMPECTOMY Left 04/22/2018   Procedure: BREAST LUMPECTOMY;  Surgeon: Robert Bellow, MD;  Location: [facility] ORS;  Service: General;  Laterality: Left;  . BREAST SURGERY Right    biopsy   . CESAREAN SECTION     x 2  . CHOLECYSTECTOMY  [id number]   Dr Burt Knack  . COLONOSCOPY  2003   Dr Theodosia Paling in [place]  . fibroid tumor    . PACEMAKER INSERTION N/A 01/28/2016   Procedure: INSERTION PACEMAKER;  Surgeon: Isaias Cowman, MD;  Location: [facility] ORS;  Service: Cardiovascular;  Laterality: N/A;  . SENTINEL NODE BIOPSY Left 04/22/2018   Procedure: SENTINEL NODE BIOPSY;  Surgeon: Robert Bellow, MD;  Location: [facility] ORS;   Service: General;  Laterality: Left;  . UPPER GI ENDOSCOPY  2003   Dr Theodosia Paling in [place]    Family History  Problem Relation Age of Onset  . Cancer Sister 14       breast  . Breast cancer Maternal Aunt 68  . Breast cancer Cousin   . Breast cancer Other 90    Social History Social History   Tobacco Use  . Smoking status: Never Smoker  . Smokeless tobacco: Former Systems developer    Types: Snuff  . Tobacco comment: occasionally  Substance Use Topics  . Alcohol use: No  . Drug use: No    Allergies  Allergen Reactions  . Codeine Anaphylaxis  . Penicillins Anaphylaxis    Has patient had a PCN reaction causing immediate rash, facial/tongue/throat swelling, SOB or lightheadedness with hypotension: Yes Has patient had a PCN reaction causing severe rash involving mucus membranes or skin necrosis: No Has patient had a PCN reaction that required hospitalization Yes Has patient had a PCN reaction occurring within the last 10 years: No If all of the above answers are "NO", then may proceed with Cephalosporin use.  . Atorvastatin     Other reaction(s): Unknown  . Clonidine Derivatives Other (See Comments)    Reaction:  Unknown   . Gabapentin Nausea Only  . Losartan Other (See Comments)    Other reaction(s): Other (See Comments) Reaction:  Muscle aches  Reaction:  Muscle aches   . Morphine     Other reaction(s): Unknown  . Morphine And Related Other (See Comments)    Reaction:  Unknown   . Reglan [Metoclopramide] Other (See Comments)    Reaction:  Tremors   . Hydralazine Anxiety    Current Outpatient Medications  Medication Sig Dispense Refill  . albuterol (PROAIR HFA) 108 (90 Base) MCG/ACT inhaler Inhale 1-2 puffs into the lungs every 4 (four) hours as needed for wheezing or shortness of breath. 1 Inhaler 5  . amLODipine (NORVASC) 2.5 MG tablet Take 1 tablet (2.5 mg total) by mouth daily. 90 tablet 3  . aspirin EC 81 MG tablet Take 81 mg by mouth daily.     Marland Kitchen atenolol (TENORMIN) 25  MG tablet Take 1 tablet (25 mg total) by mouth daily. In the afternoon (Patient taking differently: Take 25 mg by mouth daily at 3 pm. In the afternoon) 90 tablet 3  . Cholecalciferol (VITAMIN D3) 1000 UNITS CAPS Take 1,000 Units by mouth every morning.     . docusate sodium (COLACE) 100 MG capsule Take 100 mg by mouth daily as needed (for constipation.).    Marland Kitchen glipiZIDE (GLUCOTROL) 5 MG tablet TAKE 1/2 (ONE-HALF) TABLET BY MOUTH ONCE DAILY BEFORE SUPPER (Patient taking differently: Take 2.5 mg by mouth daily after supper. ) 45 tablet 2  . glucose blood (ONE TOUCH ULTRA TEST) test strip USE ONE STRIP TO CHECK GLUCOSE TWICE DAILY 200 each 3  . glucose blood (ONE TOUCH ULTRA TEST) test strip USE 1 STRIP TO CHECK GLUCOSE TWICE DAILY 100 each 2  . glucose blood (ONE TOUCH ULTRA TEST) test strip USE 1 STRIP TO CHECK GLUCOSE TWICE DAILY.DX E11.9 200 each 5  . hydrochlorothiazide (HYDRODIURIL) 25 MG tablet Take 25 mg by mouth daily.     . isosorbide mononitrate (IMDUR) 60 MG 24 hr tablet Take 60 mg by mouth 2 (two) times daily.    Marland Kitchen lisinopril (PRINIVIL,ZESTRIL) 20 MG tablet TAKE 1 TABLET BY MOUTH TWICE DAILY (Patient taking differently: Take 20 mg by mouth 2 (two) times daily. ) 180 tablet 1  . meclizine (ANTIVERT) 25 MG tablet TAKE 1 TABLET BY MOUTH THREE TIMES DAILY AS NEEDED (Patient taking differently: Take 25 mg by mouth 3 (three) times daily as needed (dizziness). ) 90 tablet 1  . nitroGLYCERIN (NITROSTAT) 0.4 MG SL tablet Place 0.4 mg under the tongue every 5 (five) minutes as needed for chest pain.    Marland Kitchen omeprazole (PRILOSEC) 20 MG capsule TAKE 1 CAPSULE BY MOUTH ONCE DAILY (Patient taking differently: Take 20 mg by mouth at bedtime as needed (indigestion/heartburn). ) 90 capsule 1  . ONETOUCH DELICA LANCETS 84Z MISC USE 1  TO CHECK GLUCOSE TWICE DAILY 200 each 0  . Polyethyl Glycol-Propyl Glycol 0.4-0.3 % SOLN Place 1 drop into both eyes 3 (three) times daily as needed (for dry/irritated eyes.).    Marland Kitchen  potassium chloride 20 MEQ/15ML (10%) SOLN Take 15 mLs (20 mEq total) by mouth every other day. 225 mL 11  . psyllium (METAMUCIL) 58.6 % packet Take 1 packet by mouth daily as needed (for regularity).     . rosuvastatin (CRESTOR) 5 MG tablet Take 1 tablet (5 mg total) by mouth daily. (Patient taking differently: Take 5 mg by mouth daily with supper. ) 30 tablet 5  .  traMADol (ULTRAM) 50 MG tablet Take 1 tablet (50 mg total) by mouth every 6 (six) hours as needed. 15 tablet 0   No current facility-administered medications for this visit.     Review of Systems Review of Systems  Constitutional: Negative.   Respiratory: Positive for shortness of breath.   Cardiovascular: Negative.   Neurological: Positive for tremors.    Blood pressure (!) 175/75, pulse 68, temperature 97.9 F (36.6 C), temperature source Temporal, resp. rate 18, height 5' 6"  (1.676 m), weight 139 lb (63 kg), SpO2 96 %.  Physical Exam Physical Exam  Constitutional: She is oriented to person, place, and time. She appears well-developed and well-nourished.  Eyes: Conjunctivae are normal. No scleral icterus.  Neck: Normal range of motion.  Cardiovascular: Normal rate, regular rhythm and normal heart sounds.  Pulmonary/Chest: Effort normal and breath sounds normal. Right breast exhibits no inverted nipple, no mass, no nipple discharge, no skin change and no tenderness. Left breast exhibits skin change and tenderness. Left breast exhibits no inverted nipple, no mass and no nipple discharge. Breasts are symmetrical.    Lymphadenopathy:    She has no cervical adenopathy.  Neurological: She is alert and oriented to person, place, and time.  Skin: Skin is warm and dry.  Nursing note reviewed.   Data Reviewed DIAGNOSIS:  A. BREAST, LEFT, UPPER INNER QUADRANT; WIDE LOCAL EXCISION:  - INVASIVE MAMMARY CARCINOMA, NO SPECIAL TYPE, 2.6 CM, GRADE 1, SEE  SUMMARY BELOW.  - BIOPSY SITE CHANGES PRESENT.   B. SENTINEL LYMPH NODE  #1, LEFT AXILLA; EXCISION:  - NEGATIVE FOR MALIGNANCY, ONE LYMPH NODE (0/1).   C. BREAST, LEFT, NEW SUPERIOR MARGIN; RE-EXCISION:  - NEGATIVE FOR MALIGNANCY.   Comment:  In the original excision, invasive carcinoma is 0.5 mm from the superior  margin. With the superior margin re-excised, the closest margin is  inferior, 4 mm.   CANCER CASE SUMMARY: INVASIVE CARCINOMA OF THE BREAST  Procedure: Excision  Specimen Laterality: Left  Tumor Size: 26 mm  Histologic Type: Invasive carcinoma of no special type  Histologic Grade (Nottingham Histologic Score)    Glandular (Acinar)/Tubular Differentiation: 2    Nuclear Pleomorphism: 2    Mitotic Rate: 1    Overall Grade: Grade 1  Ductal Carcinoma In Situ (DCIS): Present, low-grade without necrosis  Tumor Extension:    Skin is present and free of carcinoma    Skeletal muscle is free of carcinoma   Margins:    Invasive Carcinoma Margins: Uninvolved by invasive carcinoma    Distance from closest margin: 4 mm    Specify closest margin: Inferior     DCIS Margins: Uninvolved by DCIS    Distance from closest margin: At least 10 mm    Specify closest margin: Superior   Regional Lymph Nodes: Uninvolved by tumor cells  Number of Lymph Nodes Examined: 1  Number of Sentinel Nodes Examined: 1   Lymphovascular Invasion: Not identified  Pathologic Stage Classification (pTNM, AJCC 8th Edition): pT2 pN0 (sn)   BREAST BIOMARKER TESTS PERFORMED ON PREVIOUS CORE BIOPSY ([id number],  collected 03/31/2018)  Estrogen Receptor (ER) Status: POSITIVE, 100%, strong staining  Progesterone Receptor (PR) Status: POSITIVE, 80%, strong staining  HER2 (by immunohistochemistry): NEGATIVE (score 1+)   Assessment    Stage II carcinoma of the left breast.  Doing well post wide excision.  No cardiopulmonary findings on exam, normal oximetry.  Fine motor tremors, no evidence of neurological deficit.    Plan  Pathology was  not available at  the time of the patient and her daughter's visit but this was conveyed to her daughter by phone the evening of the visit.  All in all she is doing well.  We will plan for the patient to resume her Coumadin tonight.  Follow-up exam in 1 week.  Use  A heating pad on low on the left breat area to help with discomfort. Dr.Lattie Cervi will call the patient or her daughter with pathology report. Start coumadin tonight.  HPI, Physical Exam, Assessment and Plan have been scribed under the direction and in the presence of Hervey Ard, Md.  Eudelia Bunch R. Bobette Mo, CMA   Forest Gleason Sebastiano Luecke 04/26/2018, 8:26 PM

## 2018-04-26 NOTE — Patient Instructions (Addendum)
Use  A heating pad on low on the left breat area to help with discomfort. Dr.Byrnett will call the patient or her daughter with pathology report. Start coumadin tonight. Follow up in 1 week.

## 2018-05-03 ENCOUNTER — Encounter: Payer: Self-pay | Admitting: General Surgery

## 2018-05-03 ENCOUNTER — Other Ambulatory Visit: Payer: Self-pay

## 2018-05-03 ENCOUNTER — Ambulatory Visit (INDEPENDENT_AMBULATORY_CARE_PROVIDER_SITE_OTHER): Payer: Medicare Other | Admitting: General Surgery

## 2018-05-03 ENCOUNTER — Other Ambulatory Visit: Payer: Self-pay | Admitting: *Deleted

## 2018-05-03 VITALS — BP 159/73 | HR 69 | Temp 98.1°F | Resp 16 | Ht 66.0 in | Wt 136.6 lb

## 2018-05-03 DIAGNOSIS — C50912 Malignant neoplasm of unspecified site of left female breast: Secondary | ICD-10-CM

## 2018-05-03 MED ORDER — LETROZOLE 2.5 MG PO TABS: 2.5000 mg | ORAL_TABLET | Freq: Every day | ORAL | 11 refills | Status: DC

## 2018-05-03 NOTE — Patient Instructions (Addendum)
Patient is to return to the office in 1 month. Call the office with any questions or concerns.

## 2018-05-03 NOTE — Progress Notes (Signed)
Patient ID: Leah Holmes, female   DOB: 1926-10-29, 84 y.o.   MRN: [id number]  Chief Complaint  Patient presents with  . Routine Post Op     1 week post op Left breast lumpectomy/ SLN Bx 04/22/18    HPI Leah Holmes is a 84 y.o. female here today for 1 week post op Left breast lumpectomy/ SLN Bx 04/22/18. Patients is here today with her daughter she states she is feeling well.  The patient reports that she has had some dizziness.  This is more of an unsteadiness of gait is a final tossing and turning boat rather than true vertigo.  Appetite improving.  HPI  Past Medical History:  Diagnosis Date  . Atrial fibrillation ([place]) 2012  . CHF (congestive heart failure) ([place])   . Colon polyp 2003  . Coronary artery disease   . Diabetes mellitus without complication ([place])   . Dyspnea   . Dysrhythmia    atrial fib  . Environmental and seasonal allergies   . GERD (gastroesophageal reflux disease)   . Hyperlipidemia   . Hypertension   . Irritable bowel syndrome   . Polyp of colon   . PONV (postoperative nausea and vomiting)    with ether, not recently "woke up in surgery once"  . Presence of permanent cardiac pacemaker   . Tremor   . Tremors of nervous system   . Ulcer     Past Surgical History:  Procedure Laterality Date  . ABDOMINAL ADHESION SURGERY    . ABDOMINAL HYSTERECTOMY    . APPENDECTOMY    . BREAST BIOPSY Right late 80s/early 90s   benign  . BREAST LUMPECTOMY Left 04/22/2018   Procedure: BREAST LUMPECTOMY;  Surgeon: Robert Bellow, MD;  Location: [facility] ORS;  Service: General;  Laterality: Left;  . BREAST SURGERY Right    biopsy   . CESAREAN SECTION     x 2  . CHOLECYSTECTOMY  [id number]   Dr Burt Knack  . COLONOSCOPY  2003   Dr Theodosia Paling in [place]  . fibroid tumor    . PACEMAKER INSERTION N/A 01/28/2016   Procedure: INSERTION PACEMAKER;  Surgeon: Isaias Cowman, MD;  Location: [facility] ORS;  Service: Cardiovascular;  Laterality: N/A;  . SENTINEL  NODE BIOPSY Left 04/22/2018   Procedure: SENTINEL NODE BIOPSY;  Surgeon: Robert Bellow, MD;  Location: [facility] ORS;  Service: General;  Laterality: Left;  . UPPER GI ENDOSCOPY  2003   Dr Theodosia Paling in [place]    Family History  Problem Relation Age of Onset  . Cancer Sister 36       breast  . Breast cancer Maternal Aunt 68  . Breast cancer Cousin   . Breast cancer Other 38    Social History Social History   Tobacco Use  . Smoking status: Never Smoker  . Smokeless tobacco: Former Systems developer    Types: Snuff  . Tobacco comment: occasionally  Substance Use Topics  . Alcohol use: No  . Drug use: No    Allergies  Allergen Reactions  . Codeine Anaphylaxis  . Penicillins Anaphylaxis    Has patient had a PCN reaction causing immediate rash, facial/tongue/throat swelling, SOB or lightheadedness with hypotension: Yes Has patient had a PCN reaction causing severe rash involving mucus membranes or skin necrosis: No Has patient had a PCN reaction that required hospitalization Yes Has patient had a PCN reaction occurring within the last 10 years: No If all of the above answers are "NO", then may proceed with Cephalosporin  use.  . Atorvastatin     Other reaction(s): Unknown  . Clonidine Derivatives Other (See Comments)    Reaction:  Unknown   . Gabapentin Nausea Only  . Losartan Other (See Comments)    Other reaction(s): Other (See Comments) Reaction:  Muscle aches  Reaction:  Muscle aches   . Morphine     Other reaction(s): Unknown  . Morphine And Related Other (See Comments)    Reaction:  Unknown   . Reglan [Metoclopramide] Other (See Comments)    Reaction:  Tremors   . Hydralazine Anxiety    Current Outpatient Medications  Medication Sig Dispense Refill  . albuterol (PROAIR HFA) 108 (90 Base) MCG/ACT inhaler Inhale 1-2 puffs into the lungs every 4 (four) hours as needed for wheezing or shortness of breath. 1 Inhaler 5  . amLODipine (NORVASC) 2.5 MG tablet Take 1 tablet (2.5 mg  total) by mouth daily. 90 tablet 3  . aspirin EC 81 MG tablet Take 81 mg by mouth daily.     Marland Kitchen atenolol (TENORMIN) 25 MG tablet Take 1 tablet (25 mg total) by mouth daily. In the afternoon (Patient taking differently: Take 25 mg by mouth daily at 3 pm. In the afternoon) 90 tablet 3  . Cholecalciferol (VITAMIN D3) 1000 UNITS CAPS Take 1,000 Units by mouth every morning.     . docusate sodium (COLACE) 100 MG capsule Take 100 mg by mouth daily as needed (for constipation.).    Marland Kitchen glipiZIDE (GLUCOTROL) 5 MG tablet TAKE 1/2 (ONE-HALF) TABLET BY MOUTH ONCE DAILY BEFORE SUPPER (Patient taking differently: Take 2.5 mg by mouth daily after supper. ) 45 tablet 2  . glucose blood (ONE TOUCH ULTRA TEST) test strip USE ONE STRIP TO CHECK GLUCOSE TWICE DAILY 200 each 3  . glucose blood (ONE TOUCH ULTRA TEST) test strip USE 1 STRIP TO CHECK GLUCOSE TWICE DAILY 100 each 2  . glucose blood (ONE TOUCH ULTRA TEST) test strip USE 1 STRIP TO CHECK GLUCOSE TWICE DAILY.DX E11.9 200 each 5  . hydrochlorothiazide (HYDRODIURIL) 25 MG tablet Take 25 mg by mouth daily.     . isosorbide mononitrate (IMDUR) 60 MG 24 hr tablet Take 60 mg by mouth 2 (two) times daily.    Marland Kitchen lisinopril (PRINIVIL,ZESTRIL) 20 MG tablet TAKE 1 TABLET BY MOUTH TWICE DAILY (Patient taking differently: Take 20 mg by mouth 2 (two) times daily. ) 180 tablet 1  . meclizine (ANTIVERT) 25 MG tablet TAKE 1 TABLET BY MOUTH THREE TIMES DAILY AS NEEDED (Patient taking differently: Take 25 mg by mouth 3 (three) times daily as needed (dizziness). ) 90 tablet 1  . nitroGLYCERIN (NITROSTAT) 0.4 MG SL tablet Place 0.4 mg under the tongue every 5 (five) minutes as needed for chest pain.    Marland Kitchen omeprazole (PRILOSEC) 20 MG capsule TAKE 1 CAPSULE BY MOUTH ONCE DAILY (Patient taking differently: Take 20 mg by mouth at bedtime as needed (indigestion/heartburn). ) 90 capsule 1  . ONETOUCH DELICA LANCETS 32G MISC USE 1  TO CHECK GLUCOSE TWICE DAILY 200 each 0  . Polyethyl  Glycol-Propyl Glycol 0.4-0.3 % SOLN Place 1 drop into both eyes 3 (three) times daily as needed (for dry/irritated eyes.).    Marland Kitchen potassium chloride 20 MEQ/15ML (10%) SOLN Take 15 mLs (20 mEq total) by mouth every other day. 225 mL 11  . psyllium (METAMUCIL) 58.6 % packet Take 1 packet by mouth daily as needed (for regularity).     . rosuvastatin (CRESTOR) 5 MG tablet Take 1  tablet (5 mg total) by mouth daily. (Patient taking differently: Take 5 mg by mouth daily with supper. ) 30 tablet 5  . traMADol (ULTRAM) 50 MG tablet Take 1 tablet (50 mg total) by mouth every 6 (six) hours as needed. 15 tablet 0  . letrozole (FEMARA) 2.5 MG tablet Take 1 tablet (2.5 mg total) by mouth daily. 30 tablet 11   No current facility-administered medications for this visit.     Review of Systems Review of Systems  Constitutional: Negative.   Respiratory: Negative.   Cardiovascular: Negative.     Blood pressure (!) 159/73, pulse 69, temperature 98.1 F (36.7 C), temperature source Temporal, resp. rate 16, height 5\' 6"  (1.676 m), weight 136 lb 9.6 oz (62 kg), SpO2 94 %.  Physical Exam Physical Exam  Constitutional: She is oriented to person, place, and time. She appears well-developed and well-nourished.  Eyes: Conjunctivae are normal. No scleral icterus.  Pulmonary/Chest: Right breast exhibits no inverted nipple, no mass, no nipple discharge, no skin change and no tenderness. No breast swelling, tenderness or discharge.    Lymphadenopathy:    She has no cervical adenopathy.  Neurological: She is alert and oriented to person, place, and time. She has normal strength. No cranial nerve deficit. She displays a negative Romberg sign.  Nystagmus.  No diplopia.  Skin: Skin is warm and dry.    Data Reviewed New data.  Assessment    Well post excision PT 2, N0 carcinoma the left breast.    Plan Discussed evaluation by medical oncology with the patient and her daughter.  Patient declines.  Discussed  institution of antiestrogen therapy.  With some persuasion the patient is amenable to a trial of letrozole, 2.5 mg daily for 1 month.  Patient is to return to the office in 1 month. Call the office with any questions or concerns. HPI, Physical Exam, Assessment and Plan have been scribed under the direction and in the presence of Hervey Ard, Md.  Eudelia Bunch R. Bobette Mo, CMA  I have completed the exam and reviewed the above documentation for accuracy and completeness.  I agree with the above.  Haematologist has been used and any errors in dictation or transcription are unintentional.  Hervey Ard, M.D., F.A.C.S.  Forest Gleason Ledia Han[place] 05/04/2018, 8:21 AM

## 2018-05-04 ENCOUNTER — Telehealth: Payer: Self-pay | Admitting: Internal Medicine

## 2018-05-04 DIAGNOSIS — Z7901 Long term (current) use of anticoagulants: Secondary | ICD-10-CM | POA: Diagnosis not present

## 2018-05-18 ENCOUNTER — Other Ambulatory Visit: Payer: Self-pay | Admitting: Internal Medicine

## 2018-05-19 ENCOUNTER — Encounter: Payer: Self-pay | Admitting: General Surgery

## 2018-05-19 ENCOUNTER — Encounter: Payer: Self-pay | Admitting: *Deleted

## 2018-05-23 ENCOUNTER — Other Ambulatory Visit: Payer: Self-pay | Admitting: General Surgery

## 2018-05-23 ENCOUNTER — Telehealth: Payer: Self-pay | Admitting: General Surgery

## 2018-05-23 ENCOUNTER — Other Ambulatory Visit: Payer: Self-pay | Admitting: *Deleted

## 2018-05-23 MED ORDER — TAMOXIFEN CITRATE 20 MG PO TABS: 20.0000 mg | ORAL_TABLET | Freq: Every day | ORAL | 0 refills | Status: DC

## 2018-05-23 NOTE — Telephone Encounter (Signed)
Received a notice from the pharmacy re: contraindication of concomitant use of warfarin and tamoxifen secondary to prolongation of clotting studies. Have requested the patient's daughter hold administration of any anti-estrogen RX at this time until office visit scheduled for December 19th.

## 2018-05-23 NOTE — Progress Notes (Signed)
Tamoxifen sent .

## 2018-05-24 DIAGNOSIS — I495 Sick sinus syndrome: Secondary | ICD-10-CM | POA: Diagnosis not present

## 2018-05-24 DIAGNOSIS — R791 Abnormal coagulation profile: Secondary | ICD-10-CM | POA: Diagnosis not present

## 2018-05-25 ENCOUNTER — Encounter: Payer: Self-pay | Admitting: *Deleted

## 2018-05-25 NOTE — Progress Notes (Addendum)
Mickel Baas from [facility] in [place] [phone]) called the office this morning about prescription that was sent in for Tamoxifen for the patient. She states that there is a contraindication since patient is on warfarin.   Originally, she was told we would need to check with Dr. Bary Castilla first and we would call her back once we had spoken to him.   After we hung up, I did see a note from Dr. Bary Castilla on 05-23-18 that stated patient should hold administration of any anti-estrogen prescription at this time until office visit scheduled for 06-02-18.  A message was left on the voicemail for [facility] with this information. They were instructed to call should they have further questions.

## 2018-06-02 ENCOUNTER — Encounter: Payer: Self-pay | Admitting: General Surgery

## 2018-06-02 ENCOUNTER — Other Ambulatory Visit: Payer: Self-pay

## 2018-06-02 ENCOUNTER — Ambulatory Visit (INDEPENDENT_AMBULATORY_CARE_PROVIDER_SITE_OTHER): Payer: Medicare Other | Admitting: General Surgery

## 2018-06-02 VITALS — BP 157/64 | HR 85 | Temp 98.1°F | Resp 15 | Ht 66.0 in | Wt 137.0 lb

## 2018-06-02 DIAGNOSIS — C50912 Malignant neoplasm of unspecified site of left female breast: Secondary | ICD-10-CM

## 2018-06-02 MED ORDER — ANASTROZOLE 1 MG PO TABS: 1.0000 mg | ORAL_TABLET | Freq: Every day | ORAL | 0 refills | Status: DC

## 2018-06-02 NOTE — Progress Notes (Signed)
Patient ID: Leah Holmes, female   DOB: 02/17/1927, 84 y.o.   MRN: [id number]  Chief Complaint  Patient presents with  . Follow-up    HPI Leah Holmes is a 84 y.o. female here today for 1 week post op Left breast lumpectomy/ SLN Bx 04/22/18. Discuss hormone therapy options.  The patient reported adverse effects with the first 2 or 3 doses of Femara.  Tamoxifen is not appropriate due to her ongoing therapy with Coumadin and the likelihood of significantly prolonged here pro time. She is here with her daughter, [name].  HPI  Past Medical History:  Diagnosis Date  . Atrial fibrillation ([place]) 2012  . CHF (congestive heart failure) ([place])   . Colon polyp 2003  . Coronary artery disease   . Diabetes mellitus without complication ([place])   . Dyspnea   . Dysrhythmia    atrial fib  . Environmental and seasonal allergies   . GERD (gastroesophageal reflux disease)   . Hyperlipidemia   . Hypertension   . Irritable bowel syndrome   . Polyp of colon   . PONV (postoperative nausea and vomiting)    with ether, not recently "woke up in surgery once"  . Presence of permanent cardiac pacemaker   . Tremor   . Tremors of nervous system   . Ulcer     Past Surgical History:  Procedure Laterality Date  . ABDOMINAL ADHESION SURGERY    . ABDOMINAL HYSTERECTOMY    . APPENDECTOMY    . BREAST BIOPSY Right late 80s/early 90s   benign  . BREAST LUMPECTOMY Left 04/22/2018   Procedure: BREAST LUMPECTOMY;  Surgeon: Robert Bellow, MD;  Location: [facility] ORS;  Service: General;  Laterality: Left;  . BREAST SURGERY Right    biopsy   . CESAREAN SECTION     x 2  . CHOLECYSTECTOMY  [phone]   Dr Burt Knack  . COLONOSCOPY  2003   Dr Theodosia Paling in [place]  . fibroid tumor    . PACEMAKER INSERTION N/A 01/28/2016   Procedure: INSERTION PACEMAKER;  Surgeon: Isaias Cowman, MD;  Location: [facility] ORS;  Service: Cardiovascular;  Laterality: N/A;  . SENTINEL NODE BIOPSY Left 04/22/2018   Procedure:  SENTINEL NODE BIOPSY;  Surgeon: Robert Bellow, MD;  Location: [facility] ORS;  Service: General;  Laterality: Left;  . UPPER GI ENDOSCOPY  2003   Dr Theodosia Paling in [place]    Family History  Problem Relation Age of Onset  . Cancer Sister 45       breast  . Breast cancer Maternal Aunt 68  . Breast cancer Cousin   . Breast cancer Other 43    Social History Social History   Tobacco Use  . Smoking status: Never Smoker  . Smokeless tobacco: Former Systems developer    Types: Snuff  . Tobacco comment: occasionally  Substance Use Topics  . Alcohol use: No  . Drug use: No    Allergies  Allergen Reactions  . Codeine Anaphylaxis  . Penicillins Anaphylaxis    Has patient had a PCN reaction causing immediate rash, facial/tongue/throat swelling, SOB or lightheadedness with hypotension: Yes Has patient had a PCN reaction causing severe rash involving mucus membranes or skin necrosis: No Has patient had a PCN reaction that required hospitalization Yes Has patient had a PCN reaction occurring within the last 10 years: No If all of the above answers are "NO", then may proceed with Cephalosporin use.  . Atorvastatin     Other reaction(s): Unknown  . Clonidine Derivatives  Other (See Comments)    Reaction:  Unknown   . Gabapentin Nausea Only  . Losartan Other (See Comments)    Other reaction(s): Other (See Comments) Reaction:  Muscle aches  Reaction:  Muscle aches   . Morphine     Other reaction(s): Unknown  . Morphine And Related Other (See Comments)    Reaction:  Unknown   . Reglan [Metoclopramide] Other (See Comments)    Reaction:  Tremors   . Hydralazine Anxiety    Current Outpatient Medications  Medication Sig Dispense Refill  . albuterol (PROAIR HFA) 108 (90 Base) MCG/ACT inhaler Inhale 1-2 puffs into the lungs every 4 (four) hours as needed for wheezing or shortness of breath. 1 Inhaler 5  . amLODipine (NORVASC) 2.5 MG tablet Take 1 tablet (2.5 mg total) by mouth daily. 90 tablet 3  .  aspirin EC 81 MG tablet Take 81 mg by mouth daily.     Marland Kitchen atenolol (TENORMIN) 25 MG tablet Take 1 tablet (25 mg total) by mouth daily. In the afternoon (Patient taking differently: Take 25 mg by mouth daily at 3 pm. In the afternoon) 90 tablet 3  . Cholecalciferol (VITAMIN D3) 1000 UNITS CAPS Take 1,000 Units by mouth every morning.     . docusate sodium (COLACE) 100 MG capsule Take 100 mg by mouth daily as needed (for constipation.).    Marland Kitchen glipiZIDE (GLUCOTROL) 5 MG tablet TAKE 1/2 (ONE-HALF) TABLET BY MOUTH ONCE DAILY BEFORE SUPPER (Patient taking differently: Take 2.5 mg by mouth daily after supper. ) 45 tablet 2  . glucose blood (ONE TOUCH ULTRA TEST) test strip USE ONE STRIP TO CHECK GLUCOSE TWICE DAILY 200 each 3  . glucose blood (ONE TOUCH ULTRA TEST) test strip USE 1 STRIP TO CHECK GLUCOSE TWICE DAILY 100 each 2  . glucose blood (ONE TOUCH ULTRA TEST) test strip USE 1 STRIP TO CHECK GLUCOSE TWICE DAILY.DX E11.9 200 each 5  . hydrochlorothiazide (HYDRODIURIL) 25 MG tablet Take 25 mg by mouth daily.     . isosorbide mononitrate (IMDUR) 60 MG 24 hr tablet Take 60 mg by mouth 2 (two) times daily.    Marland Kitchen lisinopril (PRINIVIL,ZESTRIL) 20 MG tablet TAKE 1 TABLET BY MOUTH TWICE DAILY 180 tablet 1  . meclizine (ANTIVERT) 25 MG tablet TAKE 1 TABLET BY MOUTH THREE TIMES DAILY AS NEEDED (Patient taking differently: Take 25 mg by mouth 3 (three) times daily as needed (dizziness). ) 90 tablet 1  . nitroGLYCERIN (NITROSTAT) 0.4 MG SL tablet Place 0.4 mg under the tongue every 5 (five) minutes as needed for chest pain.    Marland Kitchen omeprazole (PRILOSEC) 20 MG capsule TAKE 1 CAPSULE BY MOUTH ONCE DAILY (Patient taking differently: Take 20 mg by mouth at bedtime as needed (indigestion/heartburn). ) 90 capsule 1  . ONETOUCH DELICA LANCETS 71I MISC USE 1  TO CHECK GLUCOSE TWICE DAILY 200 each 0  . Polyethyl Glycol-Propyl Glycol 0.4-0.3 % SOLN Place 1 drop into both eyes 3 (three) times daily as needed (for dry/irritated  eyes.).    Marland Kitchen potassium chloride 20 MEQ/15ML (10%) SOLN Take 15 mLs (20 mEq total) by mouth every other day. 225 mL 11  . psyllium (METAMUCIL) 58.6 % packet Take 1 packet by mouth daily as needed (for regularity).     . rosuvastatin (CRESTOR) 5 MG tablet Take 1 tablet (5 mg total) by mouth daily. (Patient taking differently: Take 5 mg by mouth daily with supper. ) 30 tablet 5  . anastrozole (ARIMIDEX) 1 MG  tablet Take 1 tablet (1 mg total) by mouth daily. 7 tablet 0   No current facility-administered medications for this visit.     Review of Systems Review of Systems  Constitutional: Negative.   Respiratory: Negative.   Cardiovascular: Negative.     Blood pressure (!) 157/64, pulse 85, temperature 98.1 F (36.7 C), temperature source Skin, resp. rate 15, height 5\' 6"  (1.676 m), weight 137 lb (62.1 kg), SpO2 97 %.  Physical Exam Physical Exam Exam conducted with a chaperone present.  Chest:       Comments: Left lumpectomy site well healed Skin:    General: Skin is warm and dry.  Neurological:     Mental Status: She is alert and oriented to person, place, and time.  Psychiatric:        Mood and Affect: Mood normal.       Assessment    Steady progress post wide excision of left breast.    Plan   Follow up in 2 months.  Recommend trial of Anastrozole patient request 1 week then if tolerates she will call for refills The patient is aware to call back for any questions or new concerns.   HPI, Physical Exam, Assessment and Plan have been scribed under the direction and in the presence of Robert Bellow, MD. Karie Fetch, RN  HPI, Physical Exam, Assessment and Plan have been scribed under the direction and in the presence of Hervey Ard, MD.  Gaspar Cola, CMA  I have completed the exam and reviewed the above documentation for accuracy and completeness.  I agree with the above.  Haematologist has been used and any errors in dictation or transcription are  unintentional.  Hervey Ard, M.D., F.A.C.S.  Forest Gleason Prinston Kynard 06/03/2018, 7:15 AM

## 2018-06-02 NOTE — Patient Instructions (Addendum)
The patient is aware to call back for any questions or new concerns.  trial of Anastrozole 1 week then if tolerates she will call for refills

## 2018-06-11 ENCOUNTER — Other Ambulatory Visit: Payer: Self-pay | Admitting: Internal Medicine

## 2018-06-15 HISTORY — PX: BREAST LUMPECTOMY: SHX2

## 2018-06-23 DIAGNOSIS — Z7901 Long term (current) use of anticoagulants: Secondary | ICD-10-CM | POA: Diagnosis not present

## 2018-07-14 ENCOUNTER — Other Ambulatory Visit: Payer: Self-pay | Admitting: Internal Medicine

## 2018-07-21 DIAGNOSIS — Z7901 Long term (current) use of anticoagulants: Secondary | ICD-10-CM | POA: Diagnosis not present

## 2018-07-22 ENCOUNTER — Ambulatory Visit (INDEPENDENT_AMBULATORY_CARE_PROVIDER_SITE_OTHER): Payer: Medicare Other | Admitting: Internal Medicine

## 2018-07-22 VITALS — BP 152/74 | HR 71 | Temp 97.4°F | Resp 16 | Wt 141.4 lb

## 2018-07-22 DIAGNOSIS — I10 Essential (primary) hypertension: Secondary | ICD-10-CM

## 2018-07-22 DIAGNOSIS — E876 Hypokalemia: Secondary | ICD-10-CM | POA: Diagnosis not present

## 2018-07-22 DIAGNOSIS — R2689 Other abnormalities of gait and mobility: Secondary | ICD-10-CM | POA: Diagnosis not present

## 2018-07-22 DIAGNOSIS — E119 Type 2 diabetes mellitus without complications: Secondary | ICD-10-CM

## 2018-07-22 DIAGNOSIS — E1169 Type 2 diabetes mellitus with other specified complication: Secondary | ICD-10-CM

## 2018-07-22 DIAGNOSIS — C50912 Malignant neoplasm of unspecified site of left female breast: Secondary | ICD-10-CM

## 2018-07-22 DIAGNOSIS — E785 Hyperlipidemia, unspecified: Secondary | ICD-10-CM | POA: Diagnosis not present

## 2018-07-22 DIAGNOSIS — F411 Generalized anxiety disorder: Secondary | ICD-10-CM

## 2018-07-22 LAB — BASIC METABOLIC PANEL
BUN: 12 mg/dL (ref 6–23)
CHLORIDE: 101 meq/L (ref 96–112)
CO2: 28 mEq/L (ref 19–32)
Calcium: 9.3 mg/dL (ref 8.4–10.5)
Creatinine, Ser: 0.67 mg/dL (ref 0.40–1.20)
GFR: 82.71 mL/min (ref >60.00)
Glucose, Bld: 120 mg/dL — ABNORMAL HIGH (ref 70–99)
POTASSIUM: 3.8 meq/L (ref 3.5–5.1)
SODIUM: 137 meq/L (ref 135–145)

## 2018-07-22 LAB — HEMOGLOBIN A1C: HEMOGLOBIN A1C: 6.2 % (ref 4.6–6.5)

## 2018-07-22 MED ORDER — ATENOLOL 25 MG PO TABS: 25.0000 mg | ORAL_TABLET | Freq: Two times a day (BID) | ORAL | 3 refills | Status: DC

## 2018-07-22 NOTE — Progress Notes (Signed)
Subjective:  Patient ID: Leah Holmes, female    DOB: 05/13/1927  Age: 85 y.o. MRN: [id number]  CC: The primary encounter diagnosis was Diabetes mellitus without complication ([place]). Diagnoses of Hypokalemia, Invasive ductal carcinoma of breast, female, left ([place]), Generalized anxiety disorder, Hyperlipidemia associated with type 2 diabetes mellitus ([place]), Essential hypertension, benign, and Balance problem were also pertinent to this visit.  HPI Leah Holmes presents for follow up on multiple issues.  Despite her  many complaints today, she appears to be in good health .   Last seen in September following  a fall with multiple rib fractures  Diagnosed with breast cancer  Nov 2019 , s/p lumpectomy . Declined med onc.  byrnett prescribed letrozole, which she did not tolerate.  Tamoxifen Was c/i due to warfarin therapy,  anastrozol started but not tolerated , only took it for 2 days   Her hand Tremor is worse, but is not interfering with activities    Back,  Hands,  Leg Bones hurt,  Helps when she takes tylenol   Htn:  Stopped amlodipine due to itching. Taking atenolol only in  in the afternoon   Home BPS have been 631 to 497 systolic.   Dizzy all the time.  Feels off balance,  Denies true vertigo  symptom except "occasionally".    Has a choking feeling whenever she lies down.   Still has breast pain from surgery.  Numb and burning.  Can't take gabapentin   Using OTC potassium 99 mg once daily couldn't tolerate liquid and tablets      Outpatient Medications Prior to Visit  Medication Sig Dispense Refill  . albuterol (PROAIR HFA) 108 (90 Base) MCG/ACT inhaler Inhale 1-2 puffs into the lungs every 4 (four) hours as needed for wheezing or shortness of breath. 1 Inhaler 5  . aspirin EC 81 MG tablet Take 81 mg by mouth daily.     . Cholecalciferol (VITAMIN D3) 1000 UNITS CAPS Take 1,000 Units by mouth every morning.     . docusate sodium (COLACE) 100 MG capsule Take  100 mg by mouth daily as needed (for constipation.).    Marland Kitchen glipiZIDE (GLUCOTROL) 5 MG tablet TAKE 1/2 (ONE-HALF) TABLET BY MOUTH ONCE DAILY BEFORE SUPPER 45 tablet 1  . glucose blood (ONE TOUCH ULTRA TEST) test strip USE ONE STRIP TO CHECK GLUCOSE TWICE DAILY 200 each 3  . glucose blood (ONE TOUCH ULTRA TEST) test strip USE 1 STRIP TO CHECK GLUCOSE TWICE DAILY 100 each 2  . glucose blood (ONE TOUCH ULTRA TEST) test strip USE 1 STRIP TO CHECK GLUCOSE TWICE DAILY.DX E11.9 200 each 5  . hydrochlorothiazide (HYDRODIURIL) 25 MG tablet Take 25 mg by mouth daily.     . isosorbide mononitrate (IMDUR) 60 MG 24 hr tablet Take 60 mg by mouth 2 (two) times daily.    Marland Kitchen lisinopril (PRINIVIL,ZESTRIL) 20 MG tablet TAKE 1 TABLET BY MOUTH TWICE DAILY 180 tablet 1  . meclizine (ANTIVERT) 25 MG tablet TAKE 1 TABLET BY MOUTH THREE TIMES DAILY AS NEEDED 90 tablet 0  . nitroGLYCERIN (NITROSTAT) 0.4 MG SL tablet Place 0.4 mg under the tongue every 5 (five) minutes as needed for chest pain.    Marland Kitchen omeprazole (PRILOSEC) 20 MG capsule TAKE 1 CAPSULE BY MOUTH ONCE DAILY (Patient taking differently: Take 20 mg by mouth at bedtime as needed (indigestion/heartburn). ) 90 capsule 1  . ONETOUCH DELICA LANCETS 02O MISC USE 1  TO CHECK GLUCOSE TWICE DAILY 200 each 0  .  Polyethyl Glycol-Propyl Glycol 0.4-0.3 % SOLN Place 1 drop into both eyes 3 (three) times daily as needed (for dry/irritated eyes.).    Marland Kitchen psyllium (METAMUCIL) 58.6 % packet Take 1 packet by mouth daily as needed (for regularity).     . rosuvastatin (CRESTOR) 5 MG tablet Take 1 tablet (5 mg total) by mouth daily. (Patient taking differently: Take 5 mg by mouth daily with supper. ) 30 tablet 5  . amLODipine (NORVASC) 2.5 MG tablet Take 1 tablet (2.5 mg total) by mouth daily. 90 tablet 3  . anastrozole (ARIMIDEX) 1 MG tablet Take 1 tablet (1 mg total) by mouth daily. 7 tablet 0  . atenolol (TENORMIN) 25 MG tablet Take 1 tablet (25 mg total) by mouth daily. In the afternoon  (Patient taking differently: Take 25 mg by mouth daily at 3 pm. In the afternoon) 90 tablet 3  . potassium chloride 20 MEQ/15ML (10%) SOLN Take 15 mLs (20 mEq total) by mouth every other day. 225 mL 11   No facility-administered medications prior to visit.     Review of Systems;  Patient denies headache, fevers, malaise, unintentional weight loss, skin rash, eye pain, sinus congestion and sinus pain, sore throat, dysphagia,  hemoptysis , cough, dyspnea, wheezing, chest pain, palpitations, orthopnea, edema, abdominal pain, nausea, melena, diarrhea, constipation, flank pain, dysuria, hematuria, urinary  Frequency, nocturia, numbness, tingling, seizures,  Focal weakness, Loss of consciousness,  Tremor, insomnia, depression, anxiety, and suicidal ideation.      Objective:  BP (!) 152/74 (BP Location: Left Arm, Patient Position: Sitting, Cuff Size: Normal)   Pulse 71   Temp (!) 97.4 F (36.3 C) (Oral)   Resp 16   Wt 141 lb 6.4 oz (64.1 kg)   SpO2 96%   BMI 22.82 kg/m   BP Readings from Last 3 Encounters:  07/22/18 (!) 152/74  06/02/18 (!) 157/64  05/03/18 (!) 159/73    Wt Readings from Last 3 Encounters:  07/22/18 141 lb 6.4 oz (64.1 kg)  06/02/18 137 lb (62.1 kg)  05/03/18 136 lb 9.6 oz (62 kg)    General appearance: alert, cooperative and appears stated age Ears: normal TM's and external ear canals both ears Throat: lips, mucosa, and tongue normal; teeth and gums normal Neck: no adenopathy, no carotid bruit, supple, symmetrical, trachea midline and thyroid not enlarged, symmetric, no tenderness/mass/nodules Back: symmetric, no curvature. ROM normal. No CVA tenderness. Lungs: clear to auscultation bilaterally Heart: regular rate and rhythm, S1, S2 normal, no murmur, click, rub or gallop Abdomen: soft, non-tender; bowel sounds normal; no masses,  no organomegaly Pulses: 2+ and symmetric Skin: Skin color, texture, turgor normal. No rashes or lesions Lymph nodes: Cervical,  supraclavicular, and axillary nodes normal.  Lab Results  Component Value Date   HGBA1C 6.2 07/22/2018   HGBA1C 6.1 03/02/2018   HGBA1C 5.9 10/26/2017    Lab Results  Component Value Date   CREATININE 0.67 07/22/2018   CREATININE 0.62 04/08/2018   CREATININE 0.66 03/07/2018    Lab Results  Component Value Date   WBC 5.8 03/07/2018   HGB 14.2 03/07/2018   HCT 41.3 03/07/2018   PLT 230 03/07/2018   GLUCOSE 120 (H) 07/22/2018   CHOL 204 (H) 10/30/2015   TRIG 94.0 10/30/2015   HDL 57.80 10/30/2015   LDLDIRECT 116.0 10/30/2015   LDLCALC 128 (H) 10/30/2015   ALT 18 04/08/2018   AST 24 04/08/2018   NA 137 07/22/2018   K 3.8 07/22/2018   CL 101 07/22/2018  CREATININE 0.67 07/22/2018   BUN 12 07/22/2018   CO2 28 07/22/2018   TSH 0.64 03/02/2018   INR 1.00 04/22/2018   HGBA1C 6.2 07/22/2018   MICROALBUR <0.7 04/14/2017    Nm Sentinel Node Injection  Result Date: 04/22/2018 CLINICAL DATA:  Left breast cancer. EXAM: NUCLEAR MEDICINE BREAST LYMPHOSCINTIGRAPHY TECHNIQUE: Intradermal injection of radiopharmaceutical was performed at the 12 o'clock, 3 o'clock, 6 o'clock, and 9 o'clock positions around the left nipple. The patient was then sent to the operating room where the sentinel node(s) were identified and removed by the surgeon. RADIOPHARMACEUTICALS:  Total of 0.704 mCi Millipore-filtered Technetium-52m sulfur colloid, injected in four aliquots. IMPRESSION: Uncomplicated intradermal injection of Technetium-50m sulfur colloid for purposes of sentinel node identification. Electronically Signed   By: Markus Daft M.D.   On: 04/22/2018 11:46    Assessment & Plan:   Problem List Items Addressed This Visit    Invasive ductal carcinoma of breast, female, left ([place])    Treated with lumpectomy only due to patient intolerance of aromatase inhibitors. Etc       Hyperlipidemia associated with type 2 diabetes mellitus ([place])    Managed with generic crestor  Lab Results  Component  Value Date   CHOL 204 (H) 10/30/2015   HDL 57.80 10/30/2015   LDLCALC 128 (H) 10/30/2015   LDLDIRECT 116.0 10/30/2015   TRIG 94.0 10/30/2015   CHOLHDL 4 10/30/2015   Lab Results  Component Value Date   ALT 18 04/08/2018   AST 24 04/08/2018   ALKPHOS 60 04/08/2018   BILITOT 0.8 04/08/2018         Generalized anxiety disorder    She has refused to try lexapro or any other SSRI despite multiple recommendations      Essential hypertension, benign    Uncontrolled due to intolerance to amlodipine due to itching.  She is taking low doses of atenolol once daily,  Lisinopril,  And hctz   Advised to increase atenolol to 25 mg twice daily  And reassess in one week       Relevant Medications   atenolol (TENORMIN) 25 MG tablet   Diabetes mellitus without complication ([place]) - Primary    She continues to worry excessively about her glycemic control  Which is excellent on current dose of glipizide based on assessment of A1c.  reassurance given.  No changes today  Lab Results  Component Value Date   HGBA1C 6.2 07/22/2018         Relevant Orders   Hemoglobin A1c (Completed)   Balance problem    She continues to feel "off balance" but has not had any falls in several months.  Encouraged to use her walker        Other Visit Diagnoses    Hypokalemia       Relevant Orders   Basic metabolic panel (Completed)      I have discontinued Leah Holmes "[place]"'s amLODipine, potassium chloride, and anastrozole. I have also changed her atenolol. Additionally, I am having her maintain her nitroGLYCERIN, aspirin EC, psyllium, Vitamin D3, isosorbide mononitrate, hydrochlorothiazide, glucose blood, ONETOUCH DELICA LANCETS 23N, omeprazole, albuterol, glucose blood, glucose blood, rosuvastatin, docusate sodium, Polyethyl Glycol-Propyl Glycol, lisinopril, meclizine, and glipiZIDE.  Meds ordered this encounter  Medications  . atenolol (TENORMIN) 25 MG tablet    Sig: Take 1 tablet (25 mg total)  by mouth 2 (two) times daily. In the afternoon    Dispense:  180 tablet    Refill:  3    Medications  Discontinued During This Encounter  Medication Reason  . anastrozole (ARIMIDEX) 1 MG tablet   . potassium chloride 20 MEQ/15ML (10%) SOLN   . amLODipine (NORVASC) 2.5 MG tablet   . atenolol (TENORMIN) 25 MG tablet     Follow-up: No follow-ups on file.   Crecencio Mc, MD

## 2018-07-22 NOTE — Patient Instructions (Addendum)
For your pain :  You can take  up to 2000 mg of acetominophen (tylenol) every day safely  In divided doses (500 mg every 6 hours  Or 1000 mg every 12 hours.)  For joint pain    For your blood pressure:  Increase the atenolol to 25 mg twice daily,  If BP is not < 160 after 3 days,  increase dose to 50 mg twice daily    Save the meclizine for  When you have the spins ,  Not just feeling off balance.     Resume the allegra daily for your allergies , and Try using mucinex   And drinking hot tea with lemon to cut the mucus in your throat    Keep taking your current potassium tablet once daily

## 2018-07-24 ENCOUNTER — Telehealth: Payer: Self-pay | Admitting: Internal Medicine

## 2018-07-24 ENCOUNTER — Encounter: Payer: Self-pay | Admitting: Internal Medicine

## 2018-07-24 NOTE — Assessment & Plan Note (Signed)
Treated with lumpectomy only due to patient intolerance of aromatase inhibitors. Etc

## 2018-07-24 NOTE — Assessment & Plan Note (Signed)
She continues to feel "off balance" but has not had any falls in several months.  Encouraged to use her walker

## 2018-07-24 NOTE — Assessment & Plan Note (Signed)
She has refused to try lexapro or any other SSRI despite multiple recommendations

## 2018-07-24 NOTE — Assessment & Plan Note (Addendum)
Uncontrolled due to intolerance to amlodipine due to itching.  She is taking low doses of atenolol once daily,  Lisinopril,  And hctz   Advised to increase atenolol to 25 mg twice daily  And reassess in one week

## 2018-07-24 NOTE — Assessment & Plan Note (Signed)
Managed with generic crestor  Lab Results  Component Value Date   CHOL 204 (H) 10/30/2015   HDL 57.80 10/30/2015   LDLCALC 128 (H) 10/30/2015   LDLDIRECT 116.0 10/30/2015   TRIG 94.0 10/30/2015   CHOLHDL 4 10/30/2015   Lab Results  Component Value Date   ALT 18 04/08/2018   AST 24 04/08/2018   ALKPHOS 60 04/08/2018   BILITOT 0.8 04/08/2018

## 2018-07-24 NOTE — Assessment & Plan Note (Signed)
She continues to worry excessively about her glycemic control  Which is excellent on current dose of glipizide based on assessment of A1c.  reassurance given.  No changes today  Lab Results  Component Value Date   HGBA1C 6.2 07/22/2018

## 2018-08-02 ENCOUNTER — Ambulatory Visit (INDEPENDENT_AMBULATORY_CARE_PROVIDER_SITE_OTHER): Payer: Medicare Other | Admitting: General Surgery

## 2018-08-02 ENCOUNTER — Encounter: Payer: Self-pay | Admitting: General Surgery

## 2018-08-02 ENCOUNTER — Other Ambulatory Visit: Payer: Self-pay

## 2018-08-02 VITALS — BP 158/78 | HR 76 | Temp 98.1°F | Resp 18 | Ht 66.0 in | Wt 137.0 lb

## 2018-08-02 DIAGNOSIS — C50912 Malignant neoplasm of unspecified site of left female breast: Secondary | ICD-10-CM | POA: Diagnosis not present

## 2018-08-02 NOTE — Progress Notes (Signed)
Patient ID: Leah Holmes, female   DOB: 1926/10/11, 85 y.o.   MRN: [id number]  Chief Complaint  Patient presents with  . Follow-up    HPI Leah Holmes is a 85 y.o. female.  Here for follow up left breast cancer. Left breast lumpectomy/ SLN Bx 04/22/18. Unable to tolerate Anastrozole, nausea and joint aches. Left breast soreness is slowly improving. She states she didn't sleep well last night so she isn't feeling as well today. She is here with her daughter, Leah Holmes.  HPI  Past Medical History:  Diagnosis Date  . Atrial fibrillation ([place]) 2012  . CHF (congestive heart failure) ([place])   . Colon polyp 2003  . Coronary artery disease   . Diabetes mellitus without complication ([place])   . Dyspnea   . Dysrhythmia    atrial fib  . Environmental and seasonal allergies   . GERD (gastroesophageal reflux disease)   . Hyperlipidemia   . Hypertension   . Irritable bowel syndrome   . Polyp of colon   . PONV (postoperative nausea and vomiting)    with ether, not recently "woke up in surgery once"  . Presence of permanent cardiac pacemaker   . Tremor   . Tremors of nervous system   . Ulcer     Past Surgical History:  Procedure Laterality Date  . ABDOMINAL ADHESION SURGERY    . ABDOMINAL HYSTERECTOMY    . APPENDECTOMY    . BREAST BIOPSY Right late 80s/early 90s   benign  . BREAST LUMPECTOMY Left 04/22/2018   Procedure: BREAST LUMPECTOMY;  Surgeon: Robert Bellow, MD;  Location: [facility] ORS;  Service: General;  Laterality: Left;  . BREAST SURGERY Right    biopsy   . CESAREAN SECTION     x 2  . CHOLECYSTECTOMY  [id number]   Dr Burt Knack  . COLONOSCOPY  2003   Dr Theodosia Paling in [place]  . fibroid tumor    . PACEMAKER INSERTION N/A 01/28/2016   Procedure: INSERTION PACEMAKER;  Surgeon: Isaias Cowman, MD;  Location: [facility] ORS;  Service: Cardiovascular;  Laterality: N/A;  . SENTINEL NODE BIOPSY Left 04/22/2018   Procedure: SENTINEL NODE BIOPSY;  Surgeon: Robert Bellow, MD;  Location: [facility] ORS;  Service: General;  Laterality: Left;  . UPPER GI ENDOSCOPY  2003   Dr Theodosia Paling in [place]    Family History  Problem Relation Age of Onset  . Cancer Sister 33       breast  . Breast cancer Maternal Aunt 68  . Breast cancer Cousin   . Breast cancer Other 28    Social History Social History   Tobacco Use  . Smoking status: Never Smoker  . Smokeless tobacco: Former Systems developer    Types: Snuff  . Tobacco comment: occasionally  Substance Use Topics  . Alcohol use: No  . Drug use: No    Allergies  Allergen Reactions  . Codeine Anaphylaxis  . Penicillins Anaphylaxis    Has patient had a PCN reaction causing immediate rash, facial/tongue/throat swelling, SOB or lightheadedness with hypotension: Yes Has patient had a PCN reaction causing severe rash involving mucus membranes or skin necrosis: No Has patient had a PCN reaction that required hospitalization Yes Has patient had a PCN reaction occurring within the last 10 years: No If all of the above answers are "NO", then may proceed with Cephalosporin use.  . Amlodipine Other (See Comments)    Diffuse itching   . Atorvastatin     Other reaction(s): Unknown  .  Clonidine Derivatives Other (See Comments)    Reaction:  Unknown   . Gabapentin Nausea Only  . Losartan Other (See Comments)    Other reaction(s): Other (See Comments) Reaction:  Muscle aches  Reaction:  Muscle aches   . Morphine     Other reaction(s): Unknown  . Morphine And Related Other (See Comments)    Reaction:  Unknown   . Reglan [Metoclopramide] Other (See Comments)    Reaction:  Tremors   . Hydralazine Anxiety    Current Outpatient Medications  Medication Sig Dispense Refill  . albuterol (PROAIR HFA) 108 (90 Base) MCG/ACT inhaler Inhale 1-2 puffs into the lungs every 4 (four) hours as needed for wheezing or shortness of breath. 1 Inhaler 5  . aspirin EC 81 MG tablet Take 81 mg by mouth daily.     Marland Kitchen atenolol (TENORMIN) 25 MG tablet  Take 1 tablet (25 mg total) by mouth 2 (two) times daily. In the afternoon 180 tablet 3  . Cholecalciferol (VITAMIN D3) 1000 UNITS CAPS Take 1,000 Units by mouth every morning.     . docusate sodium (COLACE) 100 MG capsule Take 100 mg by mouth daily as needed (for constipation.).    Marland Kitchen glipiZIDE (GLUCOTROL) 5 MG tablet TAKE 1/2 (ONE-HALF) TABLET BY MOUTH ONCE DAILY BEFORE SUPPER 45 tablet 1  . glucose blood (ONE TOUCH ULTRA TEST) test strip USE ONE STRIP TO CHECK GLUCOSE TWICE DAILY 200 each 3  . glucose blood (ONE TOUCH ULTRA TEST) test strip USE 1 STRIP TO CHECK GLUCOSE TWICE DAILY 100 each 2  . glucose blood (ONE TOUCH ULTRA TEST) test strip USE 1 STRIP TO CHECK GLUCOSE TWICE DAILY.DX E11.9 200 each 5  . hydrochlorothiazide (HYDRODIURIL) 25 MG tablet Take 25 mg by mouth daily.     . isosorbide mononitrate (IMDUR) 60 MG 24 hr tablet Take 60 mg by mouth 2 (two) times daily.    Marland Kitchen lisinopril (PRINIVIL,ZESTRIL) 20 MG tablet TAKE 1 TABLET BY MOUTH TWICE DAILY 180 tablet 1  . meclizine (ANTIVERT) 25 MG tablet TAKE 1 TABLET BY MOUTH THREE TIMES DAILY AS NEEDED 90 tablet 0  . nitroGLYCERIN (NITROSTAT) 0.4 MG SL tablet Place 0.4 mg under the tongue every 5 (five) minutes as needed for chest pain.    Marland Kitchen omeprazole (PRILOSEC) 20 MG capsule TAKE 1 CAPSULE BY MOUTH ONCE DAILY (Patient taking differently: Take 20 mg by mouth at bedtime as needed (indigestion/heartburn). ) 90 capsule 1  . ONETOUCH DELICA LANCETS 75F MISC USE 1  TO CHECK GLUCOSE TWICE DAILY 200 each 0  . Polyethyl Glycol-Propyl Glycol 0.4-0.3 % SOLN Place 1 drop into both eyes 3 (three) times daily as needed (for dry/irritated eyes.).    Marland Kitchen psyllium (METAMUCIL) 58.6 % packet Take 1 packet by mouth daily as needed (for regularity).     . rosuvastatin (CRESTOR) 5 MG tablet Take 1 tablet (5 mg total) by mouth daily. (Patient taking differently: Take 5 mg by mouth daily with supper. ) 30 tablet 5   No current facility-administered medications for this  visit.     Review of Systems Review of Systems  Constitutional: Negative.   Respiratory: Negative.   Cardiovascular: Negative.     Blood pressure (!) 158/78, pulse 76, temperature 98.1 F (36.7 C), temperature source Temporal, resp. rate 18, height 5\' 6"  (1.676 m), weight 137 lb (62.1 kg), SpO2 98 %.  Physical Exam Physical Exam Exam conducted with a chaperone present.  Constitutional:      Appearance: She is  well-developed.  Eyes:     General: No scleral icterus.    Conjunctiva/sclera: Conjunctivae normal.  Neck:     Musculoskeletal: Neck supple.  Chest:     Breasts:        Right: No inverted nipple, mass, nipple discharge, skin change or tenderness.        Left: No inverted nipple, mass, nipple discharge, skin change or tenderness.    Skin:    General: Skin is warm and dry.  Neurological:     Mental Status: She is alert and oriented to person, place, and time.  Psychiatric:        Behavior: Behavior normal.     Data Reviewed Unable to tolerate anastrozole.  Assessment    Doing well post wide excision and sentinel node biopsy.  Poor tolerance of antiestrogen therapy.    Plan  We had a long discussion regarding potential trial of tamoxifen and its effect on her Coumadin.  Addition of this medication would necessitate frequent pro time checks which the patient is not in favor of.  At this time, we will plan for continued observation with an office follow-up in June.  The patient's daughter was present for the interview and exam.    The patient reports a long history of difficulty with intermittent shaking of the extremities which she relates to Reglan use years ago.  She reports that the shaking is more prominent.  When questioned whether she would take another pill to control this she said no.  At this time it does not seem the neurology referral is necessary.  The patient is aware to call back for any questions or new concerns. Follow up in 4 months.      HPI, assessment, plan and physical exam has been scribed under the direction and in the presence of Robert Bellow, MD. Karie Fetch, RN I have completed the exam and reviewed the above documentation for accuracy and completeness.  I agree with the above.  Haematologist has been used and any errors in dictation or transcription are unintentional.  Hervey Ard, M.D., F.A.C.S.   Leah Holmes 08/02/2018, 9:01 AM

## 2018-08-02 NOTE — Patient Instructions (Addendum)
The patient is aware to call back for any questions or new concerns. Follow up in June

## 2018-08-12 ENCOUNTER — Other Ambulatory Visit: Payer: Self-pay | Admitting: Internal Medicine

## 2018-08-22 DIAGNOSIS — Z7901 Long term (current) use of anticoagulants: Secondary | ICD-10-CM | POA: Diagnosis not present

## 2018-08-30 ENCOUNTER — Other Ambulatory Visit: Payer: Self-pay

## 2018-08-30 MED ORDER — ONETOUCH DELICA PLUS LANCET33G MISC: 2.0000 | Freq: Every day | 0 refills | Status: DC

## 2018-09-21 ENCOUNTER — Other Ambulatory Visit: Payer: Self-pay | Admitting: Internal Medicine

## 2018-09-21 DIAGNOSIS — Z7901 Long term (current) use of anticoagulants: Secondary | ICD-10-CM | POA: Diagnosis not present

## 2018-09-27 ENCOUNTER — Ambulatory Visit: Payer: Self-pay

## 2018-09-27 ENCOUNTER — Ambulatory Visit
Admission: EM | Admit: 2018-09-27 | Discharge: 2018-09-27 | Disposition: A | Discharge status: Home / Self Care | Payer: Medicare Other | Attending: Family Medicine | Admitting: Family Medicine

## 2018-09-27 ENCOUNTER — Encounter: Payer: Self-pay | Admitting: Emergency Medicine

## 2018-09-27 ENCOUNTER — Other Ambulatory Visit: Payer: Self-pay

## 2018-09-27 DIAGNOSIS — E871 Hypo-osmolality and hyponatremia: Secondary | ICD-10-CM | POA: Diagnosis not present

## 2018-09-27 DIAGNOSIS — I10 Essential (primary) hypertension: Secondary | ICD-10-CM | POA: Diagnosis not present

## 2018-09-27 LAB — URINALYSIS, COMPLETE (UACMP) WITH MICROSCOPIC
Bacteria, UA: NONE SEEN
Bilirubin Urine: NEGATIVE
Glucose, UA: NEGATIVE mg/dL
Ketones, ur: NEGATIVE mg/dL
Leukocytes,Ua: NEGATIVE
Nitrite: NEGATIVE
Protein, ur: NEGATIVE mg/dL
Specific Gravity, Urine: 1.02 (ref 1.005–1.030)
pH: 5 (ref 5.0–8.0)

## 2018-09-27 LAB — BASIC METABOLIC PANEL
Anion gap: 7 (ref 5–15)
BUN: 9 mg/dL (ref 8–23)
CO2: 28 mmol/L (ref 22–32)
Calcium: 9.1 mg/dL (ref 8.9–10.3)
Chloride: 94 mmol/L — ABNORMAL LOW (ref 98–111)
Creatinine, Ser: 0.64 mg/dL (ref 0.44–1.00)
GFR calc Af Amer: >60 mL/min (ref >60)
GFR calc non Af Amer: >60 mL/min (ref >60)
Glucose, Bld: 191 mg/dL — ABNORMAL HIGH (ref 70–99)
Potassium: 4.8 mmol/L (ref 3.5–5.1)
Sodium: 129 mmol/L — ABNORMAL LOW (ref 135–145)

## 2018-09-27 LAB — CBC WITH DIFFERENTIAL/PLATELET
Abs Immature Granulocytes: 0.02 10*3/uL (ref 0.00–0.07)
Basophils Absolute: 0 10*3/uL (ref 0.0–0.1)
Basophils Relative: 1 %
Eosinophils Absolute: 0.1 10*3/uL (ref 0.0–0.5)
Eosinophils Relative: 1 %
HCT: 43.1 % (ref 36.0–46.0)
Hemoglobin: 14.3 g/dL (ref 12.0–15.0)
Immature Granulocytes: 0 %
Lymphocytes Relative: 15 %
Lymphs Abs: 0.9 10*3/uL (ref 0.7–4.0)
MCH: 31 pg (ref 26.0–34.0)
MCHC: 33.2 g/dL (ref 30.0–36.0)
MCV: 93.5 fL (ref 80.0–100.0)
Monocytes Absolute: 0.5 10*3/uL (ref 0.1–1.0)
Monocytes Relative: 8 %
Neutro Abs: 4.6 10*3/uL (ref 1.7–7.7)
Neutrophils Relative %: 75 %
Platelets: 201 10*3/uL (ref 150–400)
RBC: 4.61 MIL/uL (ref 3.87–5.11)
RDW: 13.4 % (ref 11.5–15.5)
WBC: 6.1 10*3/uL (ref 4.0–10.5)
nRBC: 0 % (ref 0.0–0.2)

## 2018-09-27 NOTE — Discharge Instructions (Signed)
Continue recent increase in atenolol started yesterday Recheck sodium with Dr.Tullo at the end of this week

## 2018-09-27 NOTE — ED Triage Notes (Signed)
Patient c/o elevated BP since yesterday.  Patient states that her PCP told her to double up on her Atenolol if her blood pressure goes up.  Patient states that yesterday and today she has doubled her Atenolol.  Patient denies any pain.

## 2018-09-27 NOTE — Telephone Encounter (Signed)
Patient's daughter [name] called and says the patient called to let her know her BP is still up today. She says this morning her mother says it was 207/80's at 0700, she took all of her medication and about 20 minutes ago it was 200/88. She says yesterday the BP was 196/84. She says when she was in the office last, Dr. Derrel Nip told her to double up on the Atenolol when her BP is up, so she's been taking 2 tabs twice a day since yesterday. She says her mother says she's nauseated this morning, but she was able to eat breakfast. She says her other symptoms off and on for the past couple of months have been SOB, blurred vision, weakness and chest discomfort, but not today. She says her mother doesn't have a way to do a virtual visit, so Dr. Derrel Nip can call her if she needs to let her know what to do. I advised I will send to the office and someone will call the patient back with Dr. Lupita Dawn recommendation, [name] verbalized understanding.  Answer Assessment - Initial Assessment Questions 1. BLOOD PRESSURE: "What is the blood pressure?" "Did you take at least two measurements 5 minutes apart?"     207/80, then 200/88 2. ONSET: "When did you take your blood pressure?"     This morning around 7am and about 20 minutes ago 3. HOW: "How did you obtain the blood pressure?" (e.g., visiting nurse, automatic home BP monitor)     Automatic home BP monitor 4. HISTORY: "Do you have a history of high blood pressure?"     Yes 5. MEDICATIONS: "Are you taking any medications for blood pressure?" "Have you missed any doses recently?"     No 6. OTHER SYMPTOMS: "Do you have any symptoms?" (e.g., headache, chest pain, blurred vision, difficulty breathing, weakness)     Nausea today, headache off and on, blurred vision off and on, SOB off and on, weakness off and on  7. PREGNANCY: "Is there any chance you are pregnant?" "When was your last menstrual period?"     No  Protocols used: HIGH BLOOD PRESSURE-A-AH

## 2018-09-27 NOTE — Telephone Encounter (Signed)
Patient stated she is having trimmers all over and dizziness, patient says she feels just awful called and notified patient daughter that patient needs to be seen at ER or UC immediately. Said she will go.

## 2018-09-28 ENCOUNTER — Telehealth: Payer: Self-pay

## 2018-09-28 DIAGNOSIS — E871 Hypo-osmolality and hyponatremia: Secondary | ICD-10-CM

## 2018-09-28 NOTE — Addendum Note (Signed)
Addended by: Crecencio Mc on: 09/28/2018 03:14 PM   Modules accepted: Orders

## 2018-09-28 NOTE — Telephone Encounter (Signed)
Pt was seen in urgent care yesterday and was advised that she follow up in one week for repeat sodium level. Is it okay to order the lab?

## 2018-09-28 NOTE — Telephone Encounter (Signed)
Yes,  I have ordered the BMET .

## 2018-09-30 ENCOUNTER — Other Ambulatory Visit (INDEPENDENT_AMBULATORY_CARE_PROVIDER_SITE_OTHER): Payer: Medicare Other

## 2018-09-30 ENCOUNTER — Other Ambulatory Visit: Payer: Self-pay

## 2018-09-30 DIAGNOSIS — E871 Hypo-osmolality and hyponatremia: Secondary | ICD-10-CM

## 2018-09-30 NOTE — Telephone Encounter (Signed)
Called pt and she stated that her daughter was on the phone to call back in a little while.

## 2018-09-30 NOTE — Telephone Encounter (Signed)
Lab appt has been scheduled. Pt's daughter is aware of appt date and time.

## 2018-09-30 NOTE — Addendum Note (Signed)
Addended by: Leeanne Rio on: 09/30/2018 03:48 PM   Modules accepted: Orders

## 2018-10-01 LAB — BASIC METABOLIC PANEL
BUN: 12 mg/dL (ref 7–25)
CO2: 26 mmol/L (ref 20–32)
Calcium: 9.7 mg/dL (ref 8.6–10.4)
Chloride: 100 mmol/L (ref 98–110)
Creat: 0.73 mg/dL (ref 0.60–0.88)
Glucose, Bld: 130 mg/dL — ABNORMAL HIGH (ref 65–99)
Potassium: 4.1 mmol/L (ref 3.5–5.3)
Sodium: 136 mmol/L (ref 135–146)

## 2018-10-12 ENCOUNTER — Other Ambulatory Visit: Payer: Self-pay | Admitting: Internal Medicine

## 2018-10-12 MED ORDER — AMLODIPINE BESYLATE 5 MG PO TABS: ORAL_TABLET | ORAL | 3 refills | Status: DC

## 2018-10-15 NOTE — ED Provider Notes (Signed)
[facility]    CSN: [id number] Arrival date & time: 09/27/18  1250     History   Chief Complaint Chief Complaint  Patient presents with  . Hypertension    HPI Leah Holmes is a 85 y.o. female.   85 yo female with a c/o high blood pressure since yesterday. States she contacted her PCP and was told to increase the atenolol. Denies any chest pains or shortness of breath.   The history is provided by the patient.  Hypertension     Past Medical History:  Diagnosis Date  . Atrial fibrillation ([place]) 2012  . CHF (congestive heart failure) ([place])   . Colon polyp 2003  . Coronary artery disease   . Diabetes mellitus without complication ([place])   . Dyspnea   . Dysrhythmia    atrial fib  . Environmental and seasonal allergies   . GERD (gastroesophageal reflux disease)   . Hyperlipidemia   . Hypertension   . Irritable bowel syndrome   . Polyp of colon   . PONV (postoperative nausea and vomiting)    with ether, not recently "woke up in surgery once"  . Presence of permanent cardiac pacemaker   . Tremor   . Tremors of nervous system   . Ulcer     Patient Active Problem List   Diagnosis Date Noted  . Invasive ductal carcinoma of breast, female, left ([place]) 04/26/2018  . Atherosclerosis of aorta ([place]) 03/15/2018  . Balance problem 03/15/2018  . Multiple rib fractures 03/15/2018  . Personal history of fall, presenting hazards to health 03/15/2018  . Mass of upper inner quadrant of left breast 03/14/2018  . Fatigue 10/27/2017  . Intention tremor 04/15/2017  . Osteoporosis 03/21/2016  . Sick sinus syndrome ([place]) 01/28/2016  . Other malaise 11/01/2015  . Major depressive disorder, recurrent episode ([place]) 10/30/2015  . Constipation 07/27/2015  . Generalized anxiety disorder 04/02/2015  . Ischemic chest pain ([place]) 01/08/2015  . Atrial fibrillation ([place]) 08/16/2014  . Medicare annual wellness visit, subsequent 05/23/2014  . Dry mouth 03/10/2014  . Low  serum vitamin D 02/16/2014  . Anal polyp 09/20/2013  . Personal history of colonic polyps 08/31/2013  . CAD (coronary artery disease) 03/09/2013  . Diabetes mellitus without complication ([place]) 50/35/4656  . Hyperlipidemia associated with type 2 diabetes mellitus ([place]) 03/09/2013  . Essential hypertension, benign 03/09/2013  . Nonulcer dyspepsia 03/09/2013    Past Surgical History:  Procedure Laterality Date  . ABDOMINAL ADHESION SURGERY    . ABDOMINAL HYSTERECTOMY    . APPENDECTOMY    . BREAST BIOPSY Right late 80s/early 90s   benign  . BREAST LUMPECTOMY Left 04/22/2018   Procedure: BREAST LUMPECTOMY;  Surgeon: Robert Bellow, MD;  Location: [facility] ORS;  Service: General;  Laterality: Left;  . BREAST LUMPECTOMY Left   . BREAST SURGERY Right    biopsy   . CESAREAN SECTION     x 2  . CHOLECYSTECTOMY  [phone]   Dr Burt Knack  . COLONOSCOPY  2003   Dr Theodosia Paling in [place]  . fibroid tumor    . PACEMAKER INSERTION N/A 01/28/2016   Procedure: INSERTION PACEMAKER;  Surgeon: Isaias Cowman, MD;  Location: [facility] ORS;  Service: Cardiovascular;  Laterality: N/A;  . SENTINEL NODE BIOPSY Left 04/22/2018   Procedure: SENTINEL NODE BIOPSY;  Surgeon: Robert Bellow, MD;  Location: [facility] ORS;  Service: General;  Laterality: Left;  . UPPER GI ENDOSCOPY  2003   Dr Theodosia Paling in [place]  OB History    Gravida  2   Para  2   Term      Preterm      AB      Living        SAB      TAB      Ectopic      Multiple      Live Births           Obstetric Comments  1st Menstrual Cycle:  16 1st Pregnancy:  30         Home Medications    Prior to Admission medications   Medication Sig Start Date End Date Taking? Authorizing Provider  aspirin EC 81 MG tablet Take 81 mg by mouth daily.    Yes [provider]  atenolol (TENORMIN) 25 MG tablet Take 1 tablet (25 mg total) by mouth 2 (two) times daily. In the afternoon 07/22/18  Yes Crecencio Mc, MD  Cholecalciferol  (VITAMIN D3) 1000 UNITS CAPS Take 1,000 Units by mouth every morning.    Yes [provider]  docusate sodium (COLACE) 100 MG capsule Take 100 mg by mouth daily as needed (for constipation.).   Yes [provider]  glipiZIDE (GLUCOTROL) 5 MG tablet TAKE 1/2 (ONE-HALF) TABLET BY MOUTH ONCE DAILY BEFORE SUPPER 07/14/18  Yes Crecencio Mc, MD  hydrochlorothiazide (HYDRODIURIL) 25 MG tablet Take 25 mg by mouth daily.  12/12/15  Yes [provider]  isosorbide mononitrate (IMDUR) 60 MG 24 hr tablet Take 60 mg by mouth 2 (two) times daily. 12/27/15  Yes [provider]  lisinopril (PRINIVIL,ZESTRIL) 20 MG tablet TAKE 1 TABLET BY MOUTH TWICE DAILY 05/19/18  Yes Crecencio Mc, MD  omeprazole (PRILOSEC) 20 MG capsule Take 1 capsule by mouth once daily 09/21/18  Yes Crecencio Mc, MD  rosuvastatin (CRESTOR) 5 MG tablet Take 1 tablet (5 mg total) by mouth daily. Patient taking differently: Take 5 mg by mouth daily with supper.  03/14/18  Yes Crecencio Mc, MD  albuterol (PROAIR HFA) 108 (90 Base) MCG/ACT inhaler Inhale 1-2 puffs into the lungs every 4 (four) hours as needed for wheezing or shortness of breath. 11/17/17   Laverle Hobby, MD  amLODipine (NORVASC) 5 MG tablet TAKE ONE TABLET (5 mg) BY MOUTH every morning 10/12/18   Crecencio Mc, MD  glucose blood (ONE TOUCH ULTRA TEST) test strip USE ONE STRIP TO CHECK GLUCOSE TWICE DAILY 01/29/17   Crecencio Mc, MD  glucose blood (ONE TOUCH ULTRA TEST) test strip USE 1 STRIP TO CHECK GLUCOSE TWICE DAILY 02/15/18   Crecencio Mc, MD  glucose blood (ONE TOUCH ULTRA TEST) test strip USE 1 STRIP TO CHECK GLUCOSE TWICE DAILY.DX E11.9 03/09/18   Crecencio Mc, MD  Lancets [facility] DELICA PLUS [id number]) MISC Inject 2 Devices into the skin daily. DX Code E11.9. 08/30/18   Crecencio Mc, MD  meclizine (ANTIVERT) 25 MG tablet TAKE 1 TABLET BY MOUTH THREE TIMES DAILY AS NEEDED 06/13/18   Crecencio Mc, MD  nitroGLYCERIN  (NITROSTAT) 0.4 MG SL tablet Place 0.4 mg under the tongue every 5 (five) minutes as needed for chest pain.    [provider]  Polyethyl Glycol-Propyl Glycol 0.4-0.3 % SOLN Place 1 drop into both eyes 3 (three) times daily as needed (for dry/irritated eyes.).    [provider]  psyllium (METAMUCIL) 58.6 % packet Take 1 packet by mouth daily as needed (for regularity).  [provider]    Family History Family History  Problem Relation Age of Onset  . Cancer Sister 72       breast  . Breast cancer Maternal Aunt 68  . Breast cancer Cousin   . Breast cancer Other 40    Social History Social History   Tobacco Use  . Smoking status: Never Smoker  . Smokeless tobacco: Former Systems developer    Types: Snuff  . Tobacco comment: occasionally  Substance Use Topics  . Alcohol use: No  . Drug use: No     Allergies   Codeine; Penicillins; Amlodipine; Atorvastatin; Clonidine derivatives; Gabapentin; Losartan; Morphine; Morphine and related; Reglan [metoclopramide]; and Hydralazine   Review of Systems Review of Systems   Physical Exam Triage Vital Signs ED Triage Vitals  Enc Vitals Group     BP 09/27/18 1310 (!) 175/93     Pulse Rate 09/27/18 1310 66     Resp 09/27/18 1310 14     Temp 09/27/18 1310 98.1 F (36.7 C)     Temp Source 09/27/18 1310 Oral     SpO2 09/27/18 1310 99 %     Weight 09/27/18 1305 141 lb (64 kg)     Height 09/27/18 1305 5\' 6"  (1.676 m)     Head Circumference --      Peak Flow --      Pain Score 09/27/18 1305 0     Pain Loc --      Pain Edu? --      Excl. in [place]? --    No data found.  Updated Vital Signs BP (!) 175/93 (BP Location: Right Arm)   Pulse 66   Temp 98.1 F (36.7 C) (Oral)   Resp 14   Ht 5\' 6"  (1.676 m)   Wt 64 kg   SpO2 99%   BMI 22.76 kg/m   Visual Acuity Right Eye Distance:   Left Eye Distance:   Bilateral Distance:    Right Eye Near:   Left Eye Near:    Bilateral Near:     Physical Exam Vitals  signs and nursing note reviewed.  Constitutional:      General: She is not in acute distress.    Appearance: She is not toxic-appearing or diaphoretic.  Cardiovascular:     Rate and Rhythm: Normal rate and regular rhythm.  Pulmonary:     Effort: Pulmonary effort is normal. No respiratory distress.     Breath sounds: Normal breath sounds. No stridor. No wheezing, rhonchi or rales.  Neurological:     Mental Status: She is alert.      UC Treatments / Results  Labs (all labs ordered are listed, but only abnormal results are displayed) Labs Reviewed  BASIC METABOLIC PANEL - Abnormal; Notable for the following components:      Result Value   Sodium 129 (*)    Chloride 94 (*)    Glucose, Bld 191 (*)    All other components within normal limits  URINALYSIS, COMPLETE (UACMP) WITH MICROSCOPIC - Abnormal; Notable for the following components:   Hgb urine dipstick TRACE (*)    All other components within normal limits  CBC WITH DIFFERENTIAL/PLATELET    EKG None  Radiology No results found.  Procedures Procedures (including critical care time)  Medications Ordered in UC Medications - No data to display  Initial Impression / Assessment and Plan / UC Course  I have reviewed the triage vital signs and the nursing notes.  Pertinent labs & imaging  results that were available during my care of the patient were reviewed by me and considered in my medical decision making (see chart for details).      Final Clinical Impressions(s) / UC Diagnoses   Final diagnoses:  Hyponatremia  Essential hypertension     Discharge Instructions     Continue recent increase in atenolol started yesterday Recheck sodium with Dr.Tullo at the end of this week    ED Prescriptions    None     1. Lab results and diagnosis reviewed with patient/daughter 2. Recommend continue monitoring and follow up with PCP for bp and recheck labs 3. Follow-up prn   Controlled Substance Prescriptions [place]  Controlled Substance Registry consulted? Not Applicable   Norval Gable, MD 10/15/18 [phone]

## 2018-10-18 DIAGNOSIS — I10 Essential (primary) hypertension: Secondary | ICD-10-CM | POA: Diagnosis not present

## 2018-10-18 DIAGNOSIS — I25118 Atherosclerotic heart disease of native coronary artery with other forms of angina pectoris: Secondary | ICD-10-CM | POA: Diagnosis not present

## 2018-10-18 DIAGNOSIS — I517 Cardiomegaly: Secondary | ICD-10-CM | POA: Diagnosis not present

## 2018-10-18 DIAGNOSIS — I482 Chronic atrial fibrillation, unspecified: Secondary | ICD-10-CM | POA: Diagnosis not present

## 2018-10-18 DIAGNOSIS — I2510 Atherosclerotic heart disease of native coronary artery without angina pectoris: Secondary | ICD-10-CM | POA: Diagnosis not present

## 2018-10-18 DIAGNOSIS — I6523 Occlusion and stenosis of bilateral carotid arteries: Secondary | ICD-10-CM | POA: Diagnosis not present

## 2018-11-06 ENCOUNTER — Other Ambulatory Visit: Payer: Self-pay | Admitting: Internal Medicine

## 2018-11-09 MED ORDER — TELMISARTAN 40 MG PO TABS: 40.0000 mg | ORAL_TABLET | Freq: Every day | ORAL | 0 refills | Status: DC

## 2018-11-09 NOTE — Telephone Encounter (Signed)
CHANGING TO TELMISARTAN.  MY CHART MESSAGE SENT :    I am making a decision to change your lisinopril to telmisartan, based on increased reports by one of my ENT colleagues of patients  developing tongue and throat swelling from lisinopril.  The condition , called "angioedema," can be fatal if a person's airway is compromised.  I also want you to take it at night instead of morning,  s recent studies have shown that taking your blood pressure medications at night protects you better from heart attacks and strokes.   Regards,   Deborra Medina, MD

## 2018-11-09 NOTE — Telephone Encounter (Signed)
Refill request for lisinopril.

## 2018-11-15 ENCOUNTER — Telehealth: Payer: Self-pay | Admitting: *Deleted

## 2018-11-15 NOTE — Telephone Encounter (Signed)
PA for telmisartan has been submitted on covermymeds.

## 2018-11-15 NOTE — Telephone Encounter (Signed)
Copied from [place] [id number]. Topic: Quick Communication - See Telephone Encounter >> Nov 14, 2018  5:58 PM [place] wrote: CRM for notification. See Telephone encounter for: 11/14/18.telmisartan ([place]) 40 MG tablet  [place], [place] [phone] (Phone) [phone] (Fax) pt was just picking this up from [place]. This needs a PA or it is over $300.00. pt daughter requesting a PA from [place] for her mom to afford.

## 2018-11-16 MED ORDER — VALSARTAN 160 MG PO TABS: 160.0000 mg | ORAL_TABLET | Freq: Every day | ORAL | 1 refills | Status: DC

## 2018-11-16 NOTE — Telephone Encounter (Signed)
Valsartan called in to take instead of telmisartan. If they refuse that,  She can resume lisinopril

## 2018-11-16 NOTE — Telephone Encounter (Signed)
PA for telmisartan has been denied by pt's insurance. Is there something else that can be called. Pt is out of the lisinopril.

## 2018-11-21 DIAGNOSIS — I482 Chronic atrial fibrillation, unspecified: Secondary | ICD-10-CM | POA: Diagnosis not present

## 2018-11-25 ENCOUNTER — Other Ambulatory Visit: Payer: Self-pay | Admitting: Internal Medicine

## 2018-11-25 MED ORDER — LISINOPRIL 20 MG PO TABS: 20.0000 mg | ORAL_TABLET | Freq: Two times a day (BID) | ORAL | 1 refills | Status: DC

## 2018-11-30 MED ORDER — ATENOLOL 25 MG PO TABS: 25.0000 mg | ORAL_TABLET | Freq: Two times a day (BID) | ORAL | 3 refills | Status: DC

## 2018-12-01 ENCOUNTER — Ambulatory Visit: Payer: Medicare Other | Admitting: General Surgery

## 2018-12-06 DIAGNOSIS — I495 Sick sinus syndrome: Secondary | ICD-10-CM | POA: Diagnosis not present

## 2018-12-12 ENCOUNTER — Emergency Department
Admission: EM | Admit: 2018-12-12 | Discharge: 2018-12-12 | Disposition: A | Discharge status: Home / Self Care | Payer: Medicare Other | Attending: Student in an Organized Health Care Education/Training Program | Admitting: Student in an Organized Health Care Education/Training Program

## 2018-12-12 ENCOUNTER — Other Ambulatory Visit: Payer: Self-pay

## 2018-12-12 ENCOUNTER — Emergency Department: Payer: Medicare Other

## 2018-12-12 DIAGNOSIS — I10 Essential (primary) hypertension: Secondary | ICD-10-CM | POA: Diagnosis not present

## 2018-12-12 DIAGNOSIS — I110 Hypertensive heart disease with heart failure: Secondary | ICD-10-CM | POA: Insufficient documentation

## 2018-12-12 DIAGNOSIS — I509 Heart failure, unspecified: Secondary | ICD-10-CM | POA: Diagnosis not present

## 2018-12-12 DIAGNOSIS — Z20828 Contact with and (suspected) exposure to other viral communicable diseases: Secondary | ICD-10-CM | POA: Insufficient documentation

## 2018-12-12 DIAGNOSIS — Z950 Presence of cardiac pacemaker: Secondary | ICD-10-CM | POA: Diagnosis not present

## 2018-12-12 DIAGNOSIS — I2510 Atherosclerotic heart disease of native coronary artery without angina pectoris: Secondary | ICD-10-CM | POA: Insufficient documentation

## 2018-12-12 DIAGNOSIS — R0602 Shortness of breath: Secondary | ICD-10-CM

## 2018-12-12 DIAGNOSIS — Z87891 Personal history of nicotine dependence: Secondary | ICD-10-CM | POA: Insufficient documentation

## 2018-12-12 DIAGNOSIS — Z79899 Other long term (current) drug therapy: Secondary | ICD-10-CM | POA: Diagnosis not present

## 2018-12-12 DIAGNOSIS — R251 Tremor, unspecified: Secondary | ICD-10-CM | POA: Diagnosis not present

## 2018-12-12 DIAGNOSIS — Z7984 Long term (current) use of oral hypoglycemic drugs: Secondary | ICD-10-CM | POA: Insufficient documentation

## 2018-12-12 DIAGNOSIS — E119 Type 2 diabetes mellitus without complications: Secondary | ICD-10-CM | POA: Diagnosis not present

## 2018-12-12 DIAGNOSIS — R0789 Other chest pain: Secondary | ICD-10-CM | POA: Diagnosis not present

## 2018-12-12 DIAGNOSIS — R079 Chest pain, unspecified: Secondary | ICD-10-CM | POA: Insufficient documentation

## 2018-12-12 LAB — COMPREHENSIVE METABOLIC PANEL
ALT: 23 U/L (ref 0–44)
AST: 35 U/L (ref 15–41)
Albumin: 3.9 g/dL (ref 3.5–5.0)
Alkaline Phosphatase: 69 U/L (ref 38–126)
Anion gap: 9 (ref 5–15)
BUN: 10 mg/dL (ref 8–23)
CO2: 26 mmol/L (ref 22–32)
Calcium: 9.3 mg/dL (ref 8.9–10.3)
Chloride: 96 mmol/L — ABNORMAL LOW (ref 98–111)
Creatinine, Ser: 0.51 mg/dL (ref 0.44–1.00)
GFR calc Af Amer: >60 mL/min (ref >60)
GFR calc non Af Amer: >60 mL/min (ref >60)
Glucose, Bld: 141 mg/dL — ABNORMAL HIGH (ref 70–99)
Potassium: 4.4 mmol/L (ref 3.5–5.1)
Sodium: 131 mmol/L — ABNORMAL LOW (ref 135–145)
Total Bilirubin: 1 mg/dL (ref 0.3–1.2)
Total Protein: 7.2 g/dL (ref 6.5–8.1)

## 2018-12-12 LAB — CBC WITH DIFFERENTIAL/PLATELET
Abs Immature Granulocytes: 0.02 10*3/uL (ref 0.00–0.07)
Basophils Absolute: 0.1 10*3/uL (ref 0.0–0.1)
Basophils Relative: 1 %
Eosinophils Absolute: 0.1 10*3/uL (ref 0.0–0.5)
Eosinophils Relative: 1 %
HCT: 41.8 % (ref 36.0–46.0)
Hemoglobin: 13.9 g/dL (ref 12.0–15.0)
Immature Granulocytes: 0 %
Lymphocytes Relative: 19 %
Lymphs Abs: 1.2 10*3/uL (ref 0.7–4.0)
MCH: 30.5 pg (ref 26.0–34.0)
MCHC: 33.3 g/dL (ref 30.0–36.0)
MCV: 91.9 fL (ref 80.0–100.0)
Monocytes Absolute: 0.6 10*3/uL (ref 0.1–1.0)
Monocytes Relative: 9 %
Neutro Abs: 4.4 10*3/uL (ref 1.7–7.7)
Neutrophils Relative %: 70 %
Platelets: 224 10*3/uL (ref 150–400)
RBC: 4.55 MIL/uL (ref 3.87–5.11)
RDW: 13.2 % (ref 11.5–15.5)
WBC: 6.3 10*3/uL (ref 4.0–10.5)
nRBC: 0 % (ref 0.0–0.2)

## 2018-12-12 LAB — BRAIN NATRIURETIC PEPTIDE: B Natriuretic Peptide: 294 pg/mL — ABNORMAL HIGH (ref 0.0–100.0)

## 2018-12-12 LAB — SARS CORONAVIRUS 2 BY RT PCR (HOSPITAL ORDER, PERFORMED IN ~~LOC~~ HOSPITAL LAB): SARS Coronavirus 2: NEGATIVE

## 2018-12-12 LAB — TROPONIN I (HIGH SENSITIVITY)
Troponin I (High Sensitivity): 4 ng/L (ref <18)
Troponin I (High Sensitivity): 4 ng/L (ref <18)

## 2018-12-12 LAB — PROTIME-INR
INR: 2.1 — ABNORMAL HIGH (ref 0.8–1.2)
Prothrombin Time: 23.3 seconds — ABNORMAL HIGH (ref 11.4–15.2)

## 2018-12-12 MED ORDER — LISINOPRIL 10 MG PO TABS
20.0000 mg | ORAL_TABLET | Freq: Once | ORAL | Status: AC
  Administered 2018-12-12: 20 mg via ORAL
  Filled 2018-12-12: qty 2

## 2018-12-12 MED ORDER — LORAZEPAM 0.5 MG PO TABS
0.2500 mg | ORAL_TABLET | Freq: Once | ORAL | Status: AC
  Administered 2018-12-12: 0.25 mg via ORAL
  Filled 2018-12-12: qty 1

## 2018-12-12 MED ORDER — ISOSORBIDE MONONITRATE ER 60 MG PO TB24
30.0000 mg | ORAL_TABLET | Freq: Every day | ORAL | Status: DC
  Administered 2018-12-12: 18:00:00 30 mg via ORAL
  Filled 2018-12-12: qty 1

## 2018-12-12 NOTE — ED Notes (Signed)
Patient off unit to xray

## 2018-12-12 NOTE — ED Triage Notes (Signed)
Pt reports that she a has a hx of afib - yesterday she started with chest pain and SHOB that has increased today - she reports that she feels like she is going to pass out - pt took BP at home and it was 219/97 and she "took a pill I had" (amlodipine) - she normally takes in the evening but she took at 145pm

## 2018-12-12 NOTE — ED Provider Notes (Addendum)
[facility] Emergency Department Provider Note    First MD Initiated Contact with Patient 12/12/18 1502     (approximate)  I have reviewed the triage vital signs and the nursing notes.   HISTORY  Chief Complaint Chest Pain and Shortness of Breath    HPI Leah Holmes is a 85 y.o. female below listed past medical history currently on Coumadin for history of A. fib with pacemaker presents the ER for shortness of breath chest pain and pressure and "just feeling awful.  ".  Denies any measured fevers.  States that she carries a chronic cough.  Denies any abdominal pain.  No lower extremity swelling.  No medication changes.  She was worried because her blood pressure was elevated earlier in the day.  She took amlodipine with some improvement in her symptoms.  Denies any pain ripping through to her back describes the discomfort as a pressure and states that she is feeling "shaky and jittery."   Denies any numbness or tingling.  No blurry vision.  No headaches.   Past Medical History:  Diagnosis Date   Atrial fibrillation ([place]) 2012   CHF (congestive heart failure) ([place])    Colon polyp 2003   Coronary artery disease    Diabetes mellitus without complication (HCC)    Dyspnea    Dysrhythmia    atrial fib   Environmental and seasonal allergies    GERD (gastroesophageal reflux disease)    Hyperlipidemia    Hypertension    Irritable bowel syndrome    Polyp of colon    PONV (postoperative nausea and vomiting)    with ether, not recently "woke up in surgery once"   Presence of permanent cardiac pacemaker    Tremor    Tremors of nervous system    Ulcer    Family History  Problem Relation Age of Onset   Cancer Sister 21       breast   Breast cancer Maternal Aunt 68   Breast cancer Cousin    Breast cancer Other 67   Past Surgical History:  Procedure Laterality Date   ABDOMINAL ADHESION SURGERY     ABDOMINAL HYSTERECTOMY      APPENDECTOMY     BREAST BIOPSY Right late 80s/early 90s   benign   BREAST LUMPECTOMY Left 04/22/2018   Procedure: BREAST LUMPECTOMY;  Surgeon: Robert Bellow, MD;  Location: [facility] ORS;  Service: General;  Laterality: Left;   BREAST LUMPECTOMY Left    BREAST SURGERY Right    biopsy    CESAREAN SECTION     x 2   CHOLECYSTECTOMY  03-2012   Dr Burt Knack   COLONOSCOPY  2003   Dr Theodosia Paling in [place]   fibroid tumor     [place] N/A 01/28/2016   Procedure: INSERTION PACEMAKER;  Surgeon: Isaias Cowman, MD;  Location: [facility] ORS;  Service: Cardiovascular;  Laterality: N/A;   SENTINEL NODE BIOPSY Left 04/22/2018   Procedure: SENTINEL NODE BIOPSY;  Surgeon: Robert Bellow, MD;  Location: [facility] ORS;  Service: General;  Laterality: Left;   UPPER GI ENDOSCOPY  2003   Dr Theodosia Paling in [place]   Patient Active Problem List   Diagnosis Date Noted   Invasive ductal carcinoma of breast, female, left ([place]) 04/26/2018   Atherosclerosis of aorta ([place]) 03/15/2018   Balance problem 03/15/2018   Multiple rib fractures 03/15/2018   Personal history of fall, presenting hazards to health 03/15/2018   Mass of upper inner quadrant of left breast 03/14/2018  Fatigue 10/27/2017   Intention tremor 04/15/2017   Osteoporosis 03/21/2016   Sick sinus syndrome ([place]) 01/28/2016   Other malaise 11/01/2015   Major depressive disorder, recurrent episode ([place]) 10/30/2015   Constipation 07/27/2015   Generalized anxiety disorder 04/02/2015   Ischemic chest pain ([place]) 01/08/2015   Atrial fibrillation ([place]) 08/16/2014   Medicare annual wellness visit, subsequent 05/23/2014   Dry mouth 03/10/2014   Low serum vitamin D 02/16/2014   Anal polyp 09/20/2013   Personal history of colonic polyps 08/31/2013   CAD (coronary artery disease) 03/09/2013   Diabetes mellitus without complication ([place]) 53/61/4431   Hyperlipidemia associated with type 2 diabetes mellitus ([place])  03/09/2013   Essential hypertension, benign 03/09/2013   Nonulcer dyspepsia 03/09/2013      Prior to Admission medications   Medication Sig Start Date End Date Taking? Authorizing Provider  albuterol (PROAIR HFA) 108 (90 Base) MCG/ACT inhaler Inhale 1-2 puffs into the lungs every 4 (four) hours as needed for wheezing or shortness of breath. 11/17/17   Laverle Hobby, MD  amLODipine (NORVASC) 5 MG tablet TAKE ONE TABLET (5 mg) BY MOUTH every morning 10/12/18   Crecencio Mc, MD  aspirin EC 81 MG tablet Take 81 mg by mouth daily.     [provider]  atenolol (TENORMIN) 25 MG tablet Take 1 tablet (25 mg total) by mouth 2 (two) times daily. 11/30/18   Crecencio Mc, MD  Cholecalciferol (VITAMIN D3) 1000 UNITS CAPS Take 1,000 Units by mouth every morning.     [provider]  docusate sodium (COLACE) 100 MG capsule Take 100 mg by mouth daily as needed (for constipation.).    [provider]  glipiZIDE (GLUCOTROL) 5 MG tablet TAKE 1/2 (ONE-HALF) TABLET BY MOUTH ONCE DAILY BEFORE SUPPER 07/14/18   Crecencio Mc, MD  glucose blood (ONE TOUCH ULTRA TEST) test strip USE ONE STRIP TO CHECK GLUCOSE TWICE DAILY 01/29/17   Crecencio Mc, MD  glucose blood (ONE TOUCH ULTRA TEST) test strip USE 1 STRIP TO CHECK GLUCOSE TWICE DAILY 02/15/18   Crecencio Mc, MD  glucose blood (ONE TOUCH ULTRA TEST) test strip USE 1 STRIP TO CHECK GLUCOSE TWICE DAILY.DX E11.9 03/09/18   Crecencio Mc, MD  hydrochlorothiazide (HYDRODIURIL) 25 MG tablet Take 25 mg by mouth daily.  12/12/15   [provider]  isosorbide mononitrate (IMDUR) 60 MG 24 hr tablet Take 60 mg by mouth 2 (two) times daily. 12/27/15   [provider]  Lancets (ONETOUCH DELICA PLUS [id number]) [place] 2 Devices into the skin daily. DX Code E11.9. 08/30/18   Crecencio Mc, MD  lisinopril (ZESTRIL) 20 MG tablet Take 1 tablet (20 mg total) by mouth 2 (two) times daily. 11/25/18   Crecencio Mc, MD    meclizine (ANTIVERT) 25 MG tablet TAKE 1 TABLET BY MOUTH THREE TIMES DAILY AS NEEDED 06/13/18   Crecencio Mc, MD  nitroGLYCERIN (NITROSTAT) 0.4 MG SL tablet Place 0.4 mg under the tongue every 5 (five) minutes as needed for chest pain.    [provider]  omeprazole (PRILOSEC) 20 MG capsule Take 1 capsule by mouth once daily 09/21/18   Crecencio Mc, MD  Polyethyl Glycol-Propyl Glycol 0.4-0.3 % SOLN Place 1 drop into both eyes 3 (three) times daily as needed (for dry/irritated eyes.).    [provider]  psyllium (METAMUCIL) 58.6 % packet Take 1 packet by mouth daily as needed (for regularity).     [provider]  rosuvastatin (CRESTOR) 5 MG tablet Take 1 tablet (5 mg total) by mouth daily. Patient taking differently: Take 5 mg by mouth daily with supper.  03/14/18   Crecencio Mc, MD    Allergies Codeine, Penicillins, Amlodipine, Atorvastatin, Clonidine derivatives, Gabapentin, Losartan, Morphine, Morphine and related, Reglan [metoclopramide], and Hydralazine    Social History Social History   Tobacco Use   Smoking status: Never Smoker   Smokeless tobacco: Former Systems developer    Types: Snuff   Tobacco comment: occasionally  Substance Use Topics   Alcohol use: No   Drug use: No    Review of Systems Patient denies headaches, rhinorrhea, blurry vision, numbness, shortness of breath, chest pain, edema, cough, abdominal pain, nausea, vomiting, diarrhea, dysuria, fevers, rashes or hallucinations unless otherwise stated above in HPI. ____________________________________________   PHYSICAL EXAM:  VITAL SIGNS: Vitals:   12/12/18 1715 12/12/18 1745  BP:    Pulse: 66 77  Resp: 16 (!) 24  Temp:    SpO2: 97% 98%    Constitutional: Alert and oriented.  Eyes: Conjunctivae are normal.  Head: Atraumatic. Nose: No congestion/rhinnorhea. Mouth/Throat: Mucous membranes are moist.   Neck: No stridor. Painless ROM.  Cardiovascular:, regular rhythm. Grossly  normal heart sounds.  Good peripheral circulation. Respiratory: Normal respiratory effort.  No retractions. Lungs CTAB. Gastrointestinal: Soft and nontender. No distention. No abdominal bruits. No CVA tenderness. Genitourinary: deferred Musculoskeletal: No lower extremity tenderness nor edema.  No joint effusions. Neurologic:  Normal speech and language. No gross focal neurologic deficits are appreciated. No facial droop bilateral resting tremor lower extremities the patient reports is chronic. Skin:  Skin is warm, dry and intact. No rash noted. Psychiatric: Mood and affect are anxious. Speech and behavior are normal.  ____________________________________________   LABS (all labs ordered are listed, but only abnormal results are displayed)  Results for orders placed or performed during the hospital encounter of 12/12/18 (from the past 24 hour(s))  Brain natriuretic peptide     Status: Abnormal   Collection Time: 12/12/18  3:45 PM  Result Value Ref Range   B Natriuretic Peptide 294.0 (H) 0.0 - 100.0 pg/mL  Troponin I (High Sensitivity)     Status: None   Collection Time: 12/12/18  3:46 PM  Result Value Ref Range   Troponin I (High Sensitivity) 4 <18 ng/L  CBC with Differential     Status: None   Collection Time: 12/12/18  3:46 PM  Result Value Ref Range   WBC 6.3 4.0 - 10.5 K/uL   RBC 4.55 3.87 - 5.11 MIL/uL   Hemoglobin 13.9 12.0 - 15.0 g/dL   HCT 41.8 36.0 - 46.0 %   MCV 91.9 80.0 - 100.0 fL   MCH 30.5 26.0 - 34.0 pg   MCHC 33.3 30.0 - 36.0 g/dL   RDW 13.2 11.5 - 15.5 %   Platelets 224 150 - 400 K/uL   nRBC 0.0 0.0 - 0.2 %   Neutrophils Relative % 70 %   Neutro Abs 4.4 1.7 - 7.7 K/uL   Lymphocytes Relative 19 %   Lymphs Abs 1.2 0.7 - 4.0 K/uL   Monocytes Relative 9 %   Monocytes Absolute 0.6 0.1 - 1.0 K/uL   Eosinophils Relative 1 %   Eosinophils Absolute 0.1 0.0 - 0.5 K/uL   Basophils Relative 1 %   Basophils Absolute 0.1 0.0 - 0.1 K/uL   Immature Granulocytes 0 %    Abs Immature Granulocytes 0.02 0.00 - 0.07 K/uL  Comprehensive metabolic panel  Status: Abnormal   Collection Time: 12/12/18  3:46 PM  Result Value Ref Range   Sodium 131 (L) 135 - 145 mmol/L   Potassium 4.4 3.5 - 5.1 mmol/L   Chloride 96 (L) 98 - 111 mmol/L   CO2 26 22 - 32 mmol/L   Glucose, Bld 141 (H) 70 - 99 mg/dL   BUN 10 8 - 23 mg/dL   Creatinine, Ser 0.51 0.44 - 1.00 mg/dL   Calcium 9.3 8.9 - 10.3 mg/dL   Total Protein 7.2 6.5 - 8.1 g/dL   Albumin 3.9 3.5 - 5.0 g/dL   AST 35 15 - 41 U/L   ALT 23 0 - 44 U/L   Alkaline Phosphatase 69 38 - 126 U/L   Total Bilirubin 1.0 0.3 - 1.2 mg/dL   GFR calc non Af Amer >60 >60 mL/min   GFR calc Af Amer >60 >60 mL/min   Anion gap 9 5 - 15  Protime-INR     Status: Abnormal   Collection Time: 12/12/18  3:46 PM  Result Value Ref Range   Prothrombin Time 23.3 (H) 11.4 - 15.2 seconds   INR 2.1 (H) 0.8 - 1.2  SARS Coronavirus 2 (CEPHEID- Performed in [place] hospital lab), Hosp Order     Status: None   Collection Time: 12/12/18  5:31 PM   Specimen: Nasopharyngeal Swab  Result Value Ref Range   SARS Coronavirus 2 NEGATIVE NEGATIVE  Troponin I (High Sensitivity)     Status: None   Collection Time: 12/12/18  5:54 PM  Result Value Ref Range   Troponin I (High Sensitivity) 4 <18 ng/L   ____________________________________________  EKG My review and personal interpretation at Time: 14:52   Indication: chest pain  Rate: 70  Rhythm: afib with occasional v paced complexes Axis: left Other: abnml ekg, no sgarbossa criteria ____________________________________________  RADIOLOGY  I personally reviewed all radiographic images ordered to evaluate for the above acute complaints and reviewed radiology reports and findings.  These findings were personally discussed with the patient.  Please see medical record for radiology report.  ____________________________________________   PROCEDURES  Procedure(s) performed:   Procedures    Critical Care performed: no ____________________________________________   INITIAL IMPRESSION / ASSESSMENT AND PLAN / ED COURSE  Pertinent labs & imaging results that were available during my care of the patient were reviewed by me and considered in my medical decision making (see chart for details).   DDX: Pneumonia, CHF, COPD, ACS, PE, anemia, anxiety  Marshall Joci Dress is a 85 y.o. who presents to the ED with symptoms as described above.  She is afebrile hypertensive but not hypoxic with no tachypnea.  Her exam is reassuring.  Patient does appear mildly anxious reporting concerns over elevated blood pressure.  Her EKG appears unchanged from previous.  Has been having these symptoms for several days now.  Will order serial enzymes to evaluate for any evidence of myocardial injury.  She is on Coumadin does not seem consistent with PE.  Heart score of 5 based on initial trop negative.  Her abdominal exam is soft and benign.  The patient will be placed on continuous pulse oximetry and telemetry for monitoring.  Laboratory evaluation will be sent to evaluate for the above complaints.     Clinical Course as of Dec 11 1900  Mon Dec 12, 2018  1643 Initial troponin is negative.  Blood pressure is mildly elevated we will give her evening dose of lisinopril.   [PR]  5176 Patient reassessed.  Repeat blood pressure is 180/70.  She denies any chest pain or pressure.  Denies any shortness of breath.  States that she feels much improved.  We discussed option for observation in the hospital given her age and risk factors with patient prefers to go home with outpatient follow-up.  Given her reassuring work-up here in the ER believe that outpatient follow-up is reasonable particularly in setting of a global pandemic.  No evidence of heart failure or infectious process.  Did recommend additional blood pressure dosing in the ER but patient declined stating she would prefer to take her  evening medicines when she gets home which is reasonable.  Have discussed with the patient and available family all diagnostics and treatments performed thus far and all questions were answered to the best of my ability. The patient demonstrates understanding and agreement with plan.    [PR]    Clinical Course User Index [PR] Merlyn Lot, MD    The patient was evaluated in Emergency Department today for the symptoms described in the history of present illness. He/she was evaluated in the context of the global COVID-19 pandemic, which necessitated consideration that the patient might be at risk for infection with the SARS-CoV-2 virus that causes COVID-19. Institutional protocols and algorithms that pertain to the evaluation of patients at risk for COVID-19 are in a state of rapid change based on information released by regulatory bodies including the CDC and federal and state organizations. These policies and algorithms were followed during the patient's care in the ED. As part of my medical decision making, I reviewed the following data within the [facility] notes reviewed and incorporated, Labs reviewed, notes from prior ED visits and [place] Controlled Substance Database   ____________________________________________   FINAL CLINICAL IMPRESSION(S) / ED DIAGNOSES  Final diagnoses:  Shortness of breath  Tremor  Hypertension, unspecified type      NEW MEDICATIONS STARTED DURING THIS VISIT:  New Prescriptions   No medications on file     Note:  This document was prepared using Dragon voice recognition software and may include unintentional dictation errors.    Merlyn Lot, MD 12/12/18 1854    Merlyn Lot, MD 12/12/18 Lurline Hare

## 2018-12-12 NOTE — ED Notes (Signed)
MD at bedside. 

## 2018-12-12 NOTE — ED Notes (Signed)
covid swab obtained and sent to lab. Dinner tray provided, meds given as ordered. Awaiting admission.

## 2018-12-12 NOTE — ED Notes (Signed)
2nd Trop drawn and sent

## 2018-12-12 NOTE — Discharge Instructions (Signed)
Please follow-up with Dr. Alveria Apley clinic.  Return to the ER if you develop any worsening shortness of breath chest pain or for any additional questions or concerns.

## 2018-12-26 DIAGNOSIS — E119 Type 2 diabetes mellitus without complications: Secondary | ICD-10-CM | POA: Diagnosis not present

## 2018-12-26 LAB — HM DIABETES EYE EXAM

## 2018-12-27 ENCOUNTER — Other Ambulatory Visit: Payer: Self-pay

## 2018-12-27 ENCOUNTER — Encounter: Payer: Self-pay | Admitting: General Surgery

## 2018-12-27 ENCOUNTER — Ambulatory Visit (INDEPENDENT_AMBULATORY_CARE_PROVIDER_SITE_OTHER): Payer: Medicare Other | Admitting: General Surgery

## 2018-12-27 VITALS — BP 171/77 | HR 78 | Temp 98.4°F | Ht 63.0 in | Wt 135.0 lb

## 2018-12-27 DIAGNOSIS — C50912 Malignant neoplasm of unspecified site of left female breast: Secondary | ICD-10-CM

## 2018-12-27 NOTE — Progress Notes (Signed)
Patient ID: Leah Holmes, female   DOB: 10-24-1926, 85 y.o.   MRN: [id number]  Chief Complaint  Patient presents with  . Follow-up    HPI Leah Holmes is a 85 y.o. female here today for her follow up breast cancer check up. Patient states she is doing well.   The patient was seen in the emergency department on December 12, 2018 with an episode of chest pain and heaviness.  Evaluation including ECG and troponin was negative.  Her symptoms have since resolved.  The patient reports her appetite is good. HPI  Past Medical History:  Diagnosis Date  . Atrial fibrillation ([place]) 2012  . CHF (congestive heart failure) ([place])   . Colon polyp 2003  . Coronary artery disease   . Diabetes mellitus without complication ([place])   . Dyspnea   . Dysrhythmia    atrial fib  . Environmental and seasonal allergies   . GERD (gastroesophageal reflux disease)   . Hyperlipidemia   . Hypertension   . Irritable bowel syndrome   . Polyp of colon   . PONV (postoperative nausea and vomiting)    with ether, not recently "woke up in surgery once"  . Presence of permanent cardiac pacemaker   . Tremor   . Tremors of nervous system   . Ulcer     Past Surgical History:  Procedure Laterality Date  . ABDOMINAL ADHESION SURGERY    . ABDOMINAL HYSTERECTOMY    . APPENDECTOMY    . BREAST BIOPSY Right late 80s/early 90s   benign  . BREAST LUMPECTOMY Left 04/22/2018   Procedure: BREAST LUMPECTOMY;  Surgeon: Robert Bellow, MD;  Location: [facility] ORS;  Service: General;  Laterality: Left;  . BREAST LUMPECTOMY Left   . BREAST SURGERY Right    biopsy   . CESAREAN SECTION     x 2  . CHOLECYSTECTOMY  [id number]   Dr Burt Knack  . COLONOSCOPY  2003   Dr Theodosia Paling in [place]  . fibroid tumor    . PACEMAKER INSERTION N/A 01/28/2016   Procedure: INSERTION PACEMAKER;  Surgeon: Isaias Cowman, MD;  Location: [facility] ORS;  Service: Cardiovascular;  Laterality: N/A;  . SENTINEL NODE BIOPSY Left 04/22/2018   Procedure: SENTINEL NODE BIOPSY;  Surgeon: Robert Bellow, MD;  Location: [facility] ORS;  Service: General;  Laterality: Left;  . UPPER GI ENDOSCOPY  2003   Dr Theodosia Paling in [place]    Family History  Problem Relation Age of Onset  . Cancer Sister 92       breast  . Breast cancer Maternal Aunt 68  . Breast cancer Cousin   . Breast cancer Other 71    Social History Social History   Tobacco Use  . Smoking status: Never Smoker  . Smokeless tobacco: Former Systems developer    Types: Snuff  . Tobacco comment: occasionally  Substance Use Topics  . Alcohol use: No  . Drug use: No    Allergies  Allergen Reactions  . Codeine Anaphylaxis  . Penicillins Anaphylaxis    Has patient had a PCN reaction causing immediate rash, facial/tongue/throat swelling, SOB or lightheadedness with hypotension: Yes Has patient had a PCN reaction causing severe rash involving mucus membranes or skin necrosis: No Has patient had a PCN reaction that required hospitalization Yes Has patient had a PCN reaction occurring within the last 10 years: No If all of the above answers are "NO", then may proceed with Cephalosporin use.  . Amlodipine Other (See Comments)    Diffuse  itching   . Atorvastatin     Other reaction(s): Unknown  . Clonidine Derivatives Other (See Comments)    Reaction:  Unknown   . Gabapentin Nausea Only  . Losartan Other (See Comments)    Other reaction(s): Other (See Comments) Reaction:  Muscle aches  Reaction:  Muscle aches   . Morphine     Other reaction(s): Unknown  . Morphine And Related Other (See Comments)    Reaction:  Unknown   . Reglan [Metoclopramide] Other (See Comments)    Reaction:  Tremors   . Hydralazine Anxiety    Current Outpatient Medications  Medication Sig Dispense Refill  . albuterol (PROAIR HFA) 108 (90 Base) MCG/ACT inhaler Inhale 1-2 puffs into the lungs every 4 (four) hours as needed for wheezing or shortness of breath. 1 Inhaler 5  . amLODipine (NORVASC) 5 MG tablet  TAKE ONE TABLET (5 mg) BY MOUTH every morning 90 tablet 3  . aspirin EC 81 MG tablet Take 81 mg by mouth daily.     Marland Kitchen atenolol (TENORMIN) 25 MG tablet Take 1 tablet (25 mg total) by mouth 2 (two) times daily. 180 tablet 3  . Cholecalciferol (VITAMIN D3) 1000 UNITS CAPS Take 1,000 Units by mouth every morning.     . docusate sodium (COLACE) 100 MG capsule Take 100 mg by mouth daily as needed (for constipation.).    Marland Kitchen glipiZIDE (GLUCOTROL) 5 MG tablet TAKE 1/2 (ONE-HALF) TABLET BY MOUTH ONCE DAILY BEFORE SUPPER 45 tablet 1  . glucose blood (ONE TOUCH ULTRA TEST) test strip USE ONE STRIP TO CHECK GLUCOSE TWICE DAILY 200 each 3  . glucose blood (ONE TOUCH ULTRA TEST) test strip USE 1 STRIP TO CHECK GLUCOSE TWICE DAILY 100 each 2  . glucose blood (ONE TOUCH ULTRA TEST) test strip USE 1 STRIP TO CHECK GLUCOSE TWICE DAILY.DX E11.9 200 each 5  . hydrochlorothiazide (HYDRODIURIL) 25 MG tablet Take 25 mg by mouth daily.     . isosorbide mononitrate (IMDUR) 60 MG 24 hr tablet Take 60 mg by mouth 2 (two) times daily.    . Lancets (ONETOUCH DELICA PLUS [id number]) MISC Inject 2 Devices into the skin daily. DX Code E11.9. 200 each 0  . lisinopril (ZESTRIL) 20 MG tablet Take 1 tablet (20 mg total) by mouth 2 (two) times daily. 180 tablet 1  . meclizine (ANTIVERT) 25 MG tablet TAKE 1 TABLET BY MOUTH THREE TIMES DAILY AS NEEDED 90 tablet 0  . nitroGLYCERIN (NITROSTAT) 0.4 MG SL tablet Place 0.4 mg under the tongue every 5 (five) minutes as needed for chest pain.    Marland Kitchen omeprazole (PRILOSEC) 20 MG capsule Take 1 capsule by mouth once daily 90 capsule 0  . Polyethyl Glycol-Propyl Glycol 0.4-0.3 % SOLN Place 1 drop into both eyes 3 (three) times daily as needed (for dry/irritated eyes.).    Marland Kitchen psyllium (METAMUCIL) 58.6 % packet Take 1 packet by mouth daily as needed (for regularity).     . rosuvastatin (CRESTOR) 5 MG tablet Take 1 tablet (5 mg total) by mouth daily. (Patient taking differently: Take 5 mg by mouth daily  with supper. ) 30 tablet 5  . warfarin (COUMADIN) 4 MG tablet Take 4 mg by mouth daily at 6 PM.     No current facility-administered medications for this visit.     Review of Systems Review of Systems  Constitutional: Negative.   Respiratory: Negative.   Cardiovascular: Negative.     Blood pressure (!) 171/77, pulse 78, temperature 98.4 F (  36.9 C), temperature source Skin, height 5\' 3"  (1.6 m), weight 135 lb (61.2 kg), SpO2 98 %. The patient's weight is down 2 pounds from her February 2020 exam. Physical Exam Physical Exam Exam conducted with a chaperone present.  Constitutional:      Appearance: She is well-developed.  Eyes:     General: No scleral icterus.    Conjunctiva/sclera: Conjunctivae normal.  Neck:     Musculoskeletal: Neck supple.  Cardiovascular:     Rate and Rhythm: Normal rate and regular rhythm.     Heart sounds: Normal heart sounds.  Pulmonary:     Effort: Pulmonary effort is normal.     Breath sounds: Normal breath sounds.  Chest:     Breasts:        Right: Normal.        Left: Normal.    Lymphadenopathy:     Cervical: No cervical adenopathy.     Upper Body:     Right upper body: No supraclavicular or axillary adenopathy.     Left upper body: No supraclavicular or axillary adenopathy.  Skin:    General: Skin is warm and dry.  Neurological:     Mental Status: She is alert and oriented to person, place, and time.     Data Reviewed Patient did not tolerate an aromatase inhibitor and family did not want to discuss secondary trial.  Assessment Doing well post wide excision and sentinel node biopsy.  Plan  Return in October bilateral diagnotic mammograms. . The patient is aware to call back for any questions or concerns.  HPI, Physical Exam, Assessment and Plan have been scribed under the direction and in the presence of Hervey Ard, MD.  Gaspar Cola, CMA  I have completed the exam and reviewed the above documentation for accuracy and  completeness.  I agree with the above.  Haematologist has been used and any errors in dictation or transcription are unintentional.  Hervey Ard, M.D., F.A.C.S.  Forest Gleason Griffon Herberg 12/27/2018, 11:18 AM

## 2018-12-27 NOTE — Patient Instructions (Signed)
Return in October bilateral diagnotic mammograms. . The patient is aware to call back for any questions or concerns.

## 2019-01-06 ENCOUNTER — Encounter: Payer: Self-pay | Admitting: General Surgery

## 2019-01-09 ENCOUNTER — Other Ambulatory Visit: Payer: Self-pay | Admitting: Internal Medicine

## 2019-01-09 NOTE — Telephone Encounter (Signed)
Called and spoke to pt.  Pt said that Dr. Derrel Nip increased this Rx.  Pt said that she just needs a new Rx with increased amounts sent to pharmacy.

## 2019-01-11 ENCOUNTER — Other Ambulatory Visit: Payer: Self-pay | Admitting: Internal Medicine

## 2019-01-11 DIAGNOSIS — I482 Chronic atrial fibrillation, unspecified: Secondary | ICD-10-CM | POA: Diagnosis not present

## 2019-01-11 MED ORDER — ATENOLOL 50 MG PO TABS: 50.0000 mg | ORAL_TABLET | Freq: Two times a day (BID) | ORAL | 0 refills | Status: DC

## 2019-01-30 ENCOUNTER — Other Ambulatory Visit: Payer: Self-pay

## 2019-01-30 MED ORDER — ONETOUCH DELICA PLUS LANCET33G MISC: 2.0000 | Freq: Every day | 2 refills | Status: DC

## 2019-02-12 ENCOUNTER — Encounter: Payer: Self-pay | Admitting: Emergency Medicine

## 2019-02-12 ENCOUNTER — Ambulatory Visit
Admission: EM | Admit: 2019-02-12 | Discharge: 2019-02-12 | Disposition: A | Discharge status: Home / Self Care | Payer: Medicare Other | Attending: Emergency Medicine | Admitting: Emergency Medicine

## 2019-02-12 ENCOUNTER — Other Ambulatory Visit: Payer: Self-pay

## 2019-02-12 ENCOUNTER — Ambulatory Visit: Payer: Medicare Other

## 2019-02-12 DIAGNOSIS — K219 Gastro-esophageal reflux disease without esophagitis: Secondary | ICD-10-CM | POA: Diagnosis not present

## 2019-02-12 DIAGNOSIS — S20212A Contusion of left front wall of thorax, initial encounter: Secondary | ICD-10-CM | POA: Insufficient documentation

## 2019-02-12 DIAGNOSIS — R0781 Pleurodynia: Secondary | ICD-10-CM | POA: Insufficient documentation

## 2019-02-12 DIAGNOSIS — I110 Hypertensive heart disease with heart failure: Secondary | ICD-10-CM | POA: Insufficient documentation

## 2019-02-12 DIAGNOSIS — Z79899 Other long term (current) drug therapy: Secondary | ICD-10-CM | POA: Diagnosis not present

## 2019-02-12 DIAGNOSIS — E785 Hyperlipidemia, unspecified: Secondary | ICD-10-CM | POA: Diagnosis not present

## 2019-02-12 DIAGNOSIS — I509 Heart failure, unspecified: Secondary | ICD-10-CM | POA: Insufficient documentation

## 2019-02-12 DIAGNOSIS — I2510 Atherosclerotic heart disease of native coronary artery without angina pectoris: Secondary | ICD-10-CM | POA: Diagnosis not present

## 2019-02-12 DIAGNOSIS — I4891 Unspecified atrial fibrillation: Secondary | ICD-10-CM | POA: Diagnosis not present

## 2019-02-12 DIAGNOSIS — Z7901 Long term (current) use of anticoagulants: Secondary | ICD-10-CM | POA: Diagnosis not present

## 2019-02-12 DIAGNOSIS — S299XXA Unspecified injury of thorax, initial encounter: Secondary | ICD-10-CM | POA: Diagnosis not present

## 2019-02-12 DIAGNOSIS — Z950 Presence of cardiac pacemaker: Secondary | ICD-10-CM | POA: Diagnosis not present

## 2019-02-12 DIAGNOSIS — K589 Irritable bowel syndrome without diarrhea: Secondary | ICD-10-CM | POA: Diagnosis not present

## 2019-02-12 DIAGNOSIS — Z7984 Long term (current) use of oral hypoglycemic drugs: Secondary | ICD-10-CM | POA: Insufficient documentation

## 2019-02-12 DIAGNOSIS — Z7982 Long term (current) use of aspirin: Secondary | ICD-10-CM | POA: Diagnosis not present

## 2019-02-12 DIAGNOSIS — E119 Type 2 diabetes mellitus without complications: Secondary | ICD-10-CM | POA: Diagnosis not present

## 2019-02-12 DIAGNOSIS — W19XXXA Unspecified fall, initial encounter: Secondary | ICD-10-CM | POA: Insufficient documentation

## 2019-02-12 NOTE — Discharge Instructions (Addendum)
500 mg of Tylenol 3-4 times a day as needed for pain.  Use heat or ice, whatever feels better.  You may be sore for the next week.  Immediately to the ER if you start to have fevers above 100.4, become short of breath, start coughing up blood, start urinating blood, blood in your stool, or for any other concerns.

## 2019-02-12 NOTE — ED Triage Notes (Signed)
Patient states that he fell on Thursday and hit the left side of her ribs on the bed rail.  Patient reports ongoing pain.

## 2019-02-12 NOTE — ED Provider Notes (Signed)
HPI  SUBJECTIVE:  Leah Holmes is a 85 y.o. female who presents with posterior left sided rib pain after having a mechanical fall 4 days ago.  States that she had dropped something on the floor, stood up quickly, lost her balance and hit the back of  her chest on a bed rail.  She denies chest pain, shortness of breath, preceding syncope.  She describes the pain as dull, sharp, achy, comes in waves.  It lasts seconds and then resolves.  She denies hitting her head, headache, shortness of breath, hemoptysis, abdominal pain, bruising.  She has tried 500 mg of Tylenol 2-3 times a day with improvement of symptoms.  Ice makes it worse.  She states that rotating her torso to the right also makes her pain worse.  Is not associate with deep inspiration, coughing, sneezing, laughing.  She has a past medical history of atrial fibrillation on warfarin, sick sinus syndrome status post pacemaker, diabetes, hypertension, osteoporosis, breast cancer.  No history of chronic kidney disease, liver disease, asthma, emphysema, COPD.  [id number], Aris Everts, MD    Past Medical History:  Diagnosis Date  . Atrial fibrillation ([place]) 2012  . CHF (congestive heart failure) ([place])   . Colon polyp 2003  . Coronary artery disease   . Diabetes mellitus without complication ([place])   . Dyspnea   . Dysrhythmia    atrial fib  . Environmental and seasonal allergies   . GERD (gastroesophageal reflux disease)   . Hyperlipidemia   . Hypertension   . Irritable bowel syndrome   . Polyp of colon   . PONV (postoperative nausea and vomiting)    with ether, not recently "woke up in surgery once"  . Presence of permanent cardiac pacemaker   . Tremor   . Tremors of nervous system   . Ulcer     Past Surgical History:  Procedure Laterality Date  . ABDOMINAL ADHESION SURGERY    . ABDOMINAL HYSTERECTOMY    . APPENDECTOMY    . BREAST BIOPSY Right late 80s/early 90s   benign  . BREAST LUMPECTOMY Left 04/22/2018   Procedure:  BREAST LUMPECTOMY;  Surgeon: Robert Bellow, MD;  Location: [facility] ORS;  Service: General;  Laterality: Left;  . BREAST LUMPECTOMY Left   . BREAST SURGERY Right    biopsy   . CESAREAN SECTION     x 2  . CHOLECYSTECTOMY  [id number]   Dr Burt Knack  . COLONOSCOPY  2003   Dr Theodosia Paling in [place]  . fibroid tumor    . PACEMAKER INSERTION N/A 01/28/2016   Procedure: INSERTION PACEMAKER;  Surgeon: Isaias Cowman, MD;  Location: [facility] ORS;  Service: Cardiovascular;  Laterality: N/A;  . SENTINEL NODE BIOPSY Left 04/22/2018   Procedure: SENTINEL NODE BIOPSY;  Surgeon: Robert Bellow, MD;  Location: [facility] ORS;  Service: General;  Laterality: Left;  . UPPER GI ENDOSCOPY  2003   Dr Theodosia Paling in [place]    Family History  Problem Relation Age of Onset  . Cancer Sister 49       breast  . Breast cancer Maternal Aunt 68  . Breast cancer Cousin   . Breast cancer Other 37    Social History   Tobacco Use  . Smoking status: Never Smoker  . Smokeless tobacco: Former Systems developer    Types: Snuff  . Tobacco comment: occasionally  Substance Use Topics  . Alcohol use: No  . Drug use: No    No current facility-administered medications for this encounter.  Current Outpatient Medications:  .  albuterol (PROAIR HFA) 108 (90 Base) MCG/ACT inhaler, Inhale 1-2 puffs into the lungs every 4 (four) hours as needed for wheezing or shortness of breath., Disp: 1 Inhaler, Rfl: 5 .  amLODipine (NORVASC) 5 MG tablet, TAKE ONE TABLET (5 mg) BY MOUTH every morning, Disp: 90 tablet, Rfl: 3 .  aspirin EC 81 MG tablet, Take 81 mg by mouth daily. , Disp: , Rfl:  .  atenolol (TENORMIN) 50 MG tablet, Take 1 tablet (50 mg total) by mouth 2 (two) times daily., Disp: 180 tablet, Rfl: 0 .  Cholecalciferol (VITAMIN D3) 1000 UNITS CAPS, Take 1,000 Units by mouth every morning. , Disp: , Rfl:  .  docusate sodium (COLACE) 100 MG capsule, Take 100 mg by mouth daily as needed (for constipation.)., Disp: , Rfl:  .  glipiZIDE (GLUCOTROL)  5 MG tablet, TAKE 1/2 (ONE-HALF) TABLET BY MOUTH ONCE DAILY BEFORE SUPPER, Disp: 45 tablet, Rfl: 0 .  hydrochlorothiazide (HYDRODIURIL) 25 MG tablet, Take 25 mg by mouth daily. , Disp: , Rfl:  .  isosorbide mononitrate (IMDUR) 60 MG 24 hr tablet, Take 60 mg by mouth 2 (two) times daily., Disp: , Rfl:  .  lisinopril (ZESTRIL) 20 MG tablet, Take 1 tablet (20 mg total) by mouth 2 (two) times daily., Disp: 180 tablet, Rfl: 1 .  meclizine (ANTIVERT) 25 MG tablet, TAKE 1 TABLET BY MOUTH THREE TIMES DAILY AS NEEDED, Disp: 90 tablet, Rfl: 0 .  nitroGLYCERIN (NITROSTAT) 0.4 MG SL tablet, Place 0.4 mg under the tongue every 5 (five) minutes as needed for chest pain., Disp: , Rfl:  .  omeprazole (PRILOSEC) 20 MG capsule, Take 1 capsule by mouth once daily, Disp: 90 capsule, Rfl: 0 .  psyllium (METAMUCIL) 58.6 % packet, Take 1 packet by mouth daily as needed (for regularity). , Disp: , Rfl:  .  rosuvastatin (CRESTOR) 5 MG tablet, Take 1 tablet (5 mg total) by mouth daily. (Patient taking differently: Take 5 mg by mouth daily with supper. ), Disp: 30 tablet, Rfl: 5 .  warfarin (COUMADIN) 4 MG tablet, Take 4 mg by mouth daily at 6 PM., Disp: , Rfl:  .  glucose blood (ONE TOUCH ULTRA TEST) test strip, USE ONE STRIP TO CHECK GLUCOSE TWICE DAILY, Disp: 200 each, Rfl: 3 .  glucose blood (ONE TOUCH ULTRA TEST) test strip, USE 1 STRIP TO CHECK GLUCOSE TWICE DAILY, Disp: 100 each, Rfl: 2 .  glucose blood (ONE TOUCH ULTRA TEST) test strip, USE 1 STRIP TO CHECK GLUCOSE TWICE DAILY.DX E11.9, Disp: 200 each, Rfl: 5 .  Lancets (ONETOUCH DELICA PLUS 123XX123) MISC, Inject 2 Devices into the skin daily. DX Code E11.9., Disp: 200 each, Rfl: 2 .  Polyethyl Glycol-Propyl Glycol 0.4-0.3 % SOLN, Place 1 drop into both eyes 3 (three) times daily as needed (for dry/irritated eyes.)., Disp: , Rfl:   Allergies  Allergen Reactions  . Codeine Anaphylaxis  . Penicillins Anaphylaxis    Has patient had a PCN reaction causing immediate  rash, facial/tongue/throat swelling, SOB or lightheadedness with hypotension: Yes Has patient had a PCN reaction causing severe rash involving mucus membranes or skin necrosis: No Has patient had a PCN reaction that required hospitalization Yes Has patient had a PCN reaction occurring within the last 10 years: No If all of the above answers are "NO", then may proceed with Cephalosporin use.  . Amlodipine Other (See Comments)    Diffuse itching   . Atorvastatin     Other  reaction(s): Unknown  . Clonidine Derivatives Other (See Comments)    Reaction:  Unknown   . Gabapentin Nausea Only  . Losartan Other (See Comments)    Other reaction(s): Other (See Comments) Reaction:  Muscle aches  Reaction:  Muscle aches   . Morphine     Other reaction(s): Unknown  . Morphine And Related Other (See Comments)    Reaction:  Unknown   . Reglan [Metoclopramide] Other (See Comments)    Reaction:  Tremors   . Hydralazine Anxiety     ROS  As noted in HPI.   Physical Exam  BP (!) 176/76 (BP Location: Left Arm)   Pulse 69   Temp 97.6 F (36.4 C) (Oral)   Resp 14   Ht 5\' 6"  (1.676 m)   Wt 59.9 kg   SpO2 96%   BMI 21.31 kg/m   Constitutional: Well developed, well nourished, no acute distress Eyes:  EOMI, conjunctiva normal bilaterally HENT: Normocephalic, atraumatic,mucus membranes moist Respiratory: Normal inspiratory effort.  Scattered faint wheezing left side.  No rales, rhonchi.  Positive bruising anterior lower ribs, patient states that this is not new, that is left over from breast surgery.  Positive tenderness over posterior lower ribs into the mid axillary and midclavicular line.  No crepitus.  No bruising.  No thoracic spine tenderness. Cardiovascular: Normal rate GI: nondistended, soft, nontender.  No bruising. skin: No rash, skin intact Musculoskeletal: no deformities Neurologic: Alert & oriented x 3, no focal neuro deficits Psychiatric: Speech and behavior appropriate   ED  Course   Medications - No data to display  Orders Placed This Encounter  Procedures  . DG Ribs Unilateral W/Chest Left    Standing Status:   Standing    Number of Occurrences:   1    Order Specific Question:   Reason for Exam (SYMPTOM  OR DIAGNOSIS REQUIRED)    Answer:   Left sided rib pain due to fall on Thursday    No results found for this or any previous visit (from the past 24 hour(s)). Dg Ribs Unilateral W/chest Left  Result Date: 02/12/2019 CLINICAL DATA:  85 year old female with left-sided rib pain after falling backward 4 days previously. EXAM: LEFT RIBS AND CHEST - 3+ VIEW COMPARISON:  Prior chest x-ray 12/12/2018 FINDINGS: Left subclavian approach cardiac rhythm maintenance device remains in stable position. The lead projects over the right ventricle. The cardiac and mediastinal contours are within normal limits. Atherosclerotic calcifications are present in the transverse aorta. Hyperinflation and mild bronchitic changes remain stable. No pneumothorax, pleural effusion or pulmonary edema. No rib fractures identified. IMPRESSION: 1. No rib fractures identified. 2. Stable chest x-ray without evidence of acute cardiopulmonary process. Electronically Signed   By: Jacqulynn Cadet M.D.   On: 02/12/2019 13:59    ED Clinical Impression  1. Contusion of rib on left side, initial encounter   2. Fall, initial encounter      ED Assessment/Plan   Patient with a mechanical fall.  No evidence of head injury.   Denies headache, loss of consciousness.  Reviewed imaging independently.  Hyperinflation.  No pneumothorax, pleural effusion.  No rib fractures.  See radiology report for full details.  Suspect rib contusion.  There is no evidence of pneumothorax, effusion, rib fractures on x-ray.  Advised 500 mg of Tylenol 3-4 times a day as needed, heat or ice, whatever feels better will give this a week or so.  Discussed wheezing with patient family member.  Suspect some mucous plugging.  They will keep an eye on this.  Discussed  imaging, MDM, treatment plan, and plan for follow-up with patient and family member.  Discussed sn/sx that should prompt return to the ED. they agree with plan.   No orders of the defined types were placed in this encounter.   *This clinic note was created using Dragon dictation software. Therefore, there may be occasional mistakes despite careful proofreading.   ?   Melynda Ripple, MD 02/12/19 [phone]

## 2019-02-13 DIAGNOSIS — I482 Chronic atrial fibrillation, unspecified: Secondary | ICD-10-CM | POA: Diagnosis not present

## 2019-03-15 DIAGNOSIS — I482 Chronic atrial fibrillation, unspecified: Secondary | ICD-10-CM | POA: Diagnosis not present

## 2019-03-27 ENCOUNTER — Telehealth: Payer: Self-pay | Admitting: Internal Medicine

## 2019-03-27 NOTE — Telephone Encounter (Signed)
MyChart message sent re blood sugars

## 2019-03-30 ENCOUNTER — Other Ambulatory Visit: Payer: Self-pay

## 2019-04-03 ENCOUNTER — Other Ambulatory Visit: Payer: Self-pay

## 2019-04-03 ENCOUNTER — Ambulatory Visit (INDEPENDENT_AMBULATORY_CARE_PROVIDER_SITE_OTHER): Payer: Medicare Other | Admitting: Internal Medicine

## 2019-04-03 ENCOUNTER — Encounter: Payer: Self-pay | Admitting: Internal Medicine

## 2019-04-03 VITALS — BP 180/94 | HR 71 | Temp 98.3°F | Resp 17 | Ht 66.0 in | Wt 135.0 lb

## 2019-04-03 DIAGNOSIS — E1140 Type 2 diabetes mellitus with diabetic neuropathy, unspecified: Secondary | ICD-10-CM

## 2019-04-03 DIAGNOSIS — F411 Generalized anxiety disorder: Secondary | ICD-10-CM | POA: Diagnosis not present

## 2019-04-03 DIAGNOSIS — R200 Anesthesia of skin: Secondary | ICD-10-CM | POA: Diagnosis not present

## 2019-04-03 DIAGNOSIS — E871 Hypo-osmolality and hyponatremia: Secondary | ICD-10-CM | POA: Diagnosis not present

## 2019-04-03 DIAGNOSIS — I10 Essential (primary) hypertension: Secondary | ICD-10-CM | POA: Diagnosis not present

## 2019-04-03 DIAGNOSIS — E1169 Type 2 diabetes mellitus with other specified complication: Secondary | ICD-10-CM

## 2019-04-03 DIAGNOSIS — E785 Hyperlipidemia, unspecified: Secondary | ICD-10-CM | POA: Diagnosis not present

## 2019-04-03 DIAGNOSIS — E119 Type 2 diabetes mellitus without complications: Secondary | ICD-10-CM | POA: Diagnosis not present

## 2019-04-03 DIAGNOSIS — R202 Paresthesia of skin: Secondary | ICD-10-CM | POA: Diagnosis not present

## 2019-04-03 DIAGNOSIS — Z23 Encounter for immunization: Secondary | ICD-10-CM

## 2019-04-03 NOTE — Patient Instructions (Addendum)
Stop the glipizide for now and continue to check your blood sugars ONCE A DAY   increase the lisinopril to 40 mg twice daily for your blood pressure  If your sodium is still low today ,  We will suspend the hctz because this medication can cause the sodium to drop

## 2019-04-03 NOTE — Progress Notes (Signed)
Subjective:  Patient ID: Leah Holmes, female    DOB: [birth year]  Age: 85 y.o. MRN: [id number]  CC: There were no encounter diagnoses.  HPI Leah Holmes presents for 6 month follow up on hypertension and diabetes mellitus .  She is accompanied by her daughter   She continues to voice complaints of a chronic nature:   I feel bad,  Tremors,  Night sweats,    Left ankle/foot  bruised from door injury but ROM is ormal   BP has elevated at home.  increaing lisinopril to 40 mg bid   Nocturia x 3 chronic. reviewed water intake   Using prune juice prn for constipation    follow up on Type 2 DM.  Taking glipizide twice daily.   Checking sugars twice daily  cbgs have been consistently under 110.   Reviewed her diet,  Use of meds.  obessive control of diabetes despite her age.    Lab Results  Component Value Date   HGBA1C 6.2 07/22/2018   Agreed to stop glipizide   Outpatient Medications Prior to Visit  Medication Sig Dispense Refill   albuterol (PROAIR HFA) 108 (90 Base) MCG/ACT inhaler Inhale 1-2 puffs into the lungs every 4 (four) hours as needed for wheezing or shortness of breath. 1 Inhaler 5   amLODipine (NORVASC) 5 MG tablet TAKE ONE TABLET (5 mg) BY MOUTH every morning 90 tablet 3   aspirin EC 81 MG tablet Take 81 mg by mouth daily.      atenolol (TENORMIN) 50 MG tablet Take 1 tablet (50 mg total) by mouth 2 (two) times daily. 180 tablet 0   Cholecalciferol (VITAMIN D3) 1000 UNITS CAPS Take 1,000 Units by mouth every morning.      glipiZIDE (GLUCOTROL) 5 MG tablet TAKE 1/2 (ONE-HALF) TABLET BY MOUTH ONCE DAILY BEFORE SUPPER 45 tablet 0   glucose blood (ONE TOUCH ULTRA TEST) test strip USE ONE STRIP TO CHECK GLUCOSE TWICE DAILY 200 each 3   glucose blood (ONE TOUCH ULTRA TEST) test strip USE 1 STRIP TO CHECK GLUCOSE TWICE DAILY 100 each 2   glucose blood (ONE TOUCH ULTRA TEST) test strip USE 1 STRIP TO CHECK GLUCOSE TWICE DAILY.DX E11.9 200 each 5     hydrochlorothiazide (HYDRODIURIL) 25 MG tablet Take 25 mg by mouth daily.      isosorbide mononitrate (IMDUR) 60 MG 24 hr tablet Take 60 mg by mouth 2 (two) times daily.     Lancets (ONETOUCH DELICA PLUS 123XX123) MISC Inject 2 Devices into the skin daily. DX Code E11.9. 200 each 2   lisinopril (ZESTRIL) 20 MG tablet Take 1 tablet (20 mg total) by mouth 2 (two) times daily. 180 tablet 1   meclizine (ANTIVERT) 25 MG tablet TAKE 1 TABLET BY MOUTH THREE TIMES DAILY AS NEEDED 90 tablet 0   nitroGLYCERIN (NITROSTAT) 0.4 MG SL tablet Place 0.4 mg under the tongue every 5 (five) minutes as needed for chest pain.     omeprazole (PRILOSEC) 20 MG capsule Take 1 capsule by mouth once daily 90 capsule 0   Polyethyl Glycol-Propyl Glycol 0.4-0.3 % SOLN Place 1 drop into both eyes 3 (three) times daily as needed (for dry/irritated eyes.).     psyllium (METAMUCIL) 58.6 % packet Take 1 packet by mouth daily as needed (for regularity).      warfarin (COUMADIN) 4 MG tablet Take 4 mg by mouth daily at 6 PM.     docusate sodium (COLACE) 100 MG capsule Take 100  mg by mouth daily as needed (for constipation.).     rosuvastatin (CRESTOR) 5 MG tablet Take 1 tablet (5 mg total) by mouth daily. (Patient not taking: Reported on 04/03/2019) 30 tablet 5   No facility-administered medications prior to visit.     Review of Systems;  Patient denies headache, fevers, malaise, unintentional weight loss, skin rash, eye pain, sinus congestion and sinus pain, sore throat, dysphagia,  hemoptysis , cough, dyspnea, wheezing, chest pain, palpitations, orthopnea, edema, abdominal pain, nausea, melena, diarrhea, constipation, flank pain, dysuria, hematuria, urinary  Frequency, nocturia, numbness, tingling, seizures,  Focal weakness, Loss of consciousness,  Tremor, insomnia, depression, anxiety, and suicidal ideation.      Objective:  BP (!) 180/94 (BP Location: Left Arm, Patient Position: Sitting, Cuff Size: Normal)     Pulse 71    Temp 98.3 F (36.8 C) (Temporal)    Resp 17    Ht 5\' 6"  (1.676 m)    Wt 135 lb (61.2 kg)    SpO2 97%    BMI 21.79 kg/m   BP Readings from Last 3 Encounters:  04/03/19 (!) 180/94  02/12/19 (!) 176/76  12/27/18 (!) 171/77    Wt Readings from Last 3 Encounters:  04/03/19 135 lb (61.2 kg)  02/12/19 132 lb (59.9 kg)  12/27/18 135 lb (61.2 kg)    General appearance: alert, cooperative and appears stated age Ears: normal TM's and external ear canals both ears Throat: lips, mucosa, and tongue normal; teeth and gums normal Neck: no adenopathy, no carotid bruit, supple, symmetrical, trachea midline and thyroid not enlarged, symmetric, no tenderness/mass/nodules Back: symmetric, no curvature. ROM normal. No CVA tenderness. Lungs: clear to auscultation bilaterally Heart: regular rate and rhythm, S1, S2 normal, no murmur, click, rub or gallop Abdomen: soft, non-tender; bowel sounds normal; no masses,  no organomegaly Pulses: 2+ and symmetric Skin: Skin color, texture, turgor normal. No rashes or lesions Lymph nodes: Cervical, supraclavicular, and axillary nodes normal.  Lab Results  Component Value Date   HGBA1C 6.2 07/22/2018   HGBA1C 6.1 03/02/2018   HGBA1C 5.9 10/26/2017    Lab Results  Component Value Date   CREATININE 0.51 12/12/2018   CREATININE 0.73 09/30/2018   CREATININE 0.64 09/27/2018    Lab Results  Component Value Date   WBC 6.3 12/12/2018   HGB 13.9 12/12/2018   HCT 41.8 12/12/2018   PLT 224 12/12/2018   GLUCOSE 141 (H) 12/12/2018   CHOL 204 (H) 10/30/2015   TRIG 94.0 10/30/2015   HDL 57.80 10/30/2015   LDLDIRECT 116.0 10/30/2015   LDLCALC 128 (H) 10/30/2015   ALT 23 12/12/2018   AST 35 12/12/2018   NA 131 (L) 12/12/2018   K 4.4 12/12/2018   CL 96 (L) 12/12/2018   CREATININE 0.51 12/12/2018   BUN 10 12/12/2018   CO2 26 12/12/2018   TSH 0.64 03/02/2018   INR 2.1 (H) 12/12/2018   HGBA1C 6.2 07/22/2018   MICROALBUR <0.7 04/14/2017    Dg  Ribs Unilateral W/chest Left  Result Date: 02/12/2019 CLINICAL DATA:  85 year old female with left-sided rib pain after falling backward 4 days previously. EXAM: LEFT RIBS AND CHEST - 3+ VIEW COMPARISON:  Prior chest x-ray 12/12/2018 FINDINGS: Left subclavian approach cardiac rhythm maintenance device remains in stable position. The lead projects over the right ventricle. The cardiac and mediastinal contours are within normal limits. Atherosclerotic calcifications are present in the transverse aorta. Hyperinflation and mild bronchitic changes remain stable. No pneumothorax, pleural effusion or pulmonary edema. No rib  fractures identified. IMPRESSION: 1. No rib fractures identified. 2. Stable chest x-ray without evidence of acute cardiopulmonary process. Electronically Signed   By: Jacqulynn Cadet M.D.   On: 02/12/2019 13:59    Assessment & Plan:   Problem List Items Addressed This Visit    None      I have discontinued Jezabel L. Augustus's rosuvastatin and docusate sodium. I am also having her maintain her nitroGLYCERIN, aspirin EC, psyllium, Vitamin D3, isosorbide mononitrate, hydrochlorothiazide, glucose blood, albuterol, glucose blood, glucose blood, Polyethyl Glycol-Propyl Glycol, meclizine, omeprazole, amLODipine, lisinopril, warfarin, glipiZIDE, atenolol, and OneTouch Delica Plus 0000000.  No orders of the defined types were placed in this encounter.   Medications Discontinued During This Encounter  Medication Reason   docusate sodium (COLACE) 100 MG capsule Patient has not taken in last 30 days   rosuvastatin (CRESTOR) 5 MG tablet Patient has not taken in last 30 days    Follow-up: No follow-ups on file.  A total of 25 minutes of face to face time was spent with patient more than half of which was spent in counselling about the above mentioned conditions  and coordination of care   Crecencio Mc, MD

## 2019-04-04 LAB — BASIC METABOLIC PANEL
BUN: 11 mg/dL (ref 6–23)
CO2: 31 mEq/L (ref 19–32)
Calcium: 9.5 mg/dL (ref 8.4–10.5)
Chloride: 97 mEq/L (ref 96–112)
Creatinine, Ser: 0.66 mg/dL (ref 0.40–1.20)
GFR: 84.02 mL/min (ref >60.00)
Glucose, Bld: 106 mg/dL — ABNORMAL HIGH (ref 70–99)
Potassium: 3.9 mEq/L (ref 3.5–5.1)
Sodium: 134 mEq/L — ABNORMAL LOW (ref 135–145)

## 2019-04-04 LAB — B12 AND FOLATE PANEL
Folate: 12.1 ng/mL (ref >5.9)
Vitamin B-12: 356 pg/mL (ref 211–911)

## 2019-04-04 LAB — TSH: TSH: 0.8 u[IU]/mL (ref 0.35–4.50)

## 2019-04-04 LAB — HEMOGLOBIN A1C: Hgb A1c MFr Bld: 6.4 % (ref 4.6–6.5)

## 2019-04-04 NOTE — Assessment & Plan Note (Signed)
She continues to refuse trials of  lexapro or any other SSRI despite multiple recommendations

## 2019-04-04 NOTE — Assessment & Plan Note (Signed)
Increasing lisinopril to 40 mg bid

## 2019-04-04 NOTE — Assessment & Plan Note (Signed)
Managed with generic crestor with good toleration   Lab Results  Component Value Date   CHOL 204 (H) 10/30/2015   HDL 57.80 10/30/2015   LDLCALC 128 (H) 10/30/2015   LDLDIRECT 116.0 10/30/2015   TRIG 94.0 10/30/2015   CHOLHDL 4 10/30/2015   Lab Results  Component Value Date   ALT 23 12/12/2018   AST 35 12/12/2018   ALKPHOS 69 12/12/2018   BILITOT 1.0 12/12/2018

## 2019-04-04 NOTE — Assessment & Plan Note (Signed)
She has finally agreed to stop the glipizide and monitor her blood sugars

## 2019-04-06 ENCOUNTER — Other Ambulatory Visit: Payer: Self-pay

## 2019-04-06 DIAGNOSIS — C50912 Malignant neoplasm of unspecified site of left female breast: Secondary | ICD-10-CM

## 2019-04-06 DIAGNOSIS — C50212 Malignant neoplasm of upper-inner quadrant of left female breast: Secondary | ICD-10-CM

## 2019-04-08 ENCOUNTER — Other Ambulatory Visit: Payer: Self-pay | Admitting: Internal Medicine

## 2019-04-13 DIAGNOSIS — I482 Chronic atrial fibrillation, unspecified: Secondary | ICD-10-CM | POA: Diagnosis not present

## 2019-04-27 MED ORDER — LISINOPRIL 20 MG PO TABS: 40.0000 mg | ORAL_TABLET | Freq: Two times a day (BID) | ORAL | 1 refills | Status: DC

## 2019-04-27 NOTE — Telephone Encounter (Signed)
Per last OV note patient to increase Lisinopril to 40 mg BID, per request I sent new script to pharmacy.

## 2019-05-02 DIAGNOSIS — I495 Sick sinus syndrome: Secondary | ICD-10-CM | POA: Diagnosis not present

## 2019-05-02 DIAGNOSIS — I6523 Occlusion and stenosis of bilateral carotid arteries: Secondary | ICD-10-CM | POA: Diagnosis not present

## 2019-05-02 DIAGNOSIS — I517 Cardiomegaly: Secondary | ICD-10-CM | POA: Diagnosis not present

## 2019-05-02 DIAGNOSIS — I340 Nonrheumatic mitral (valve) insufficiency: Secondary | ICD-10-CM | POA: Diagnosis not present

## 2019-05-02 DIAGNOSIS — I482 Chronic atrial fibrillation, unspecified: Secondary | ICD-10-CM | POA: Diagnosis not present

## 2019-05-02 DIAGNOSIS — I071 Rheumatic tricuspid insufficiency: Secondary | ICD-10-CM | POA: Diagnosis not present

## 2019-05-02 DIAGNOSIS — I2510 Atherosclerotic heart disease of native coronary artery without angina pectoris: Secondary | ICD-10-CM | POA: Diagnosis not present

## 2019-05-10 ENCOUNTER — Ambulatory Visit: Payer: Medicare Other

## 2019-05-14 ENCOUNTER — Other Ambulatory Visit: Payer: Self-pay | Admitting: Internal Medicine

## 2019-05-16 ENCOUNTER — Ambulatory Visit
Admission: RE | Admit: 2019-05-16 | Discharge: 2019-05-16 | Disposition: A | Discharge status: Home / Self Care | Payer: Medicare Other | Source: Ambulatory Visit | Attending: Surgery | Admitting: Surgery

## 2019-05-16 DIAGNOSIS — R928 Other abnormal and inconclusive findings on diagnostic imaging of breast: Secondary | ICD-10-CM | POA: Diagnosis not present

## 2019-05-16 DIAGNOSIS — Z853 Personal history of malignant neoplasm of breast: Secondary | ICD-10-CM | POA: Diagnosis not present

## 2019-05-16 DIAGNOSIS — I482 Chronic atrial fibrillation, unspecified: Secondary | ICD-10-CM | POA: Diagnosis not present

## 2019-05-16 DIAGNOSIS — C50212 Malignant neoplasm of upper-inner quadrant of left female breast: Secondary | ICD-10-CM

## 2019-05-23 ENCOUNTER — Telehealth: Payer: Self-pay

## 2019-05-23 DIAGNOSIS — I495 Sick sinus syndrome: Secondary | ICD-10-CM | POA: Diagnosis not present

## 2019-05-23 NOTE — Telephone Encounter (Signed)
-----   Message from Jules Husbands, MD sent at 05/23/2019 11:57 AM EST ----- Please let her know mammo is nml ----- Message ----- From: Interface, Rad Results In Sent: 05/16/2019  11:43 AM EST To: Jules Husbands, MD

## 2019-05-23 NOTE — Telephone Encounter (Signed)
Spoke with patient to notify her of recent normal mammogram results per Dr.Pabon. Patient notified and verbalized understanding.

## 2019-05-24 ENCOUNTER — Ambulatory Visit: Payer: Medicare Other | Admitting: Surgery

## 2019-05-29 ENCOUNTER — Other Ambulatory Visit: Payer: Self-pay

## 2019-05-29 MED ORDER — GLUCOSE BLOOD VI STRP: ORAL_STRIP | 3 refills | Status: AC

## 2019-05-31 ENCOUNTER — Ambulatory Visit: Payer: Medicare Other | Admitting: Surgery

## 2019-05-31 ENCOUNTER — Other Ambulatory Visit: Payer: Self-pay | Admitting: Internal Medicine

## 2019-06-01 ENCOUNTER — Other Ambulatory Visit: Payer: Self-pay | Admitting: Internal Medicine

## 2019-06-02 ENCOUNTER — Telehealth: Payer: Self-pay

## 2019-06-02 MED ORDER — GLUCOSE BLOOD VI STRP: ORAL_STRIP | 3 refills | Status: DC

## 2019-06-02 NOTE — Telephone Encounter (Signed)
I'VE ALREADY DONE IT

## 2019-06-02 NOTE — Telephone Encounter (Signed)
Patient prefers to check more often,  [name] to  my instructions!  This will force her to stop checking so much .  Let daughter Leah Holmes  know

## 2019-06-02 NOTE — Telephone Encounter (Signed)
Received a a fax from [facility] stating that the pt's insurance will only cover for the pt to check her sugar once daily because she is not on insulin.

## 2019-06-02 NOTE — Telephone Encounter (Signed)
I will let her daughter know. Is it okay for me to send in a new rx with directions to use once daily?

## 2019-06-11 ENCOUNTER — Other Ambulatory Visit: Payer: Self-pay | Admitting: Internal Medicine

## 2019-06-19 ENCOUNTER — Other Ambulatory Visit: Payer: Self-pay

## 2019-06-19 ENCOUNTER — Encounter: Payer: Self-pay | Admitting: Surgery

## 2019-06-19 ENCOUNTER — Ambulatory Visit (INDEPENDENT_AMBULATORY_CARE_PROVIDER_SITE_OTHER): Payer: Medicare Other | Admitting: Surgery

## 2019-06-19 VITALS — BP 172/64 | HR 70 | Temp 97.7°F | Resp 14 | Ht 66.0 in | Wt 132.6 lb

## 2019-06-19 DIAGNOSIS — C50912 Malignant neoplasm of unspecified site of left female breast: Secondary | ICD-10-CM

## 2019-06-19 NOTE — Patient Instructions (Addendum)
You will hear from our office in November 2021 for your mammogram appointment information. Your mammogram will be scheduled for December 2021.  Please call the office if you have any questions or concerns.    Breast Self-Awareness Breast self-awareness is knowing how your breasts look and feel. Doing breast self-awareness is important. It allows you to catch a breast problem early while it is still small and can be treated. All women should do breast self-awareness, including women who have had breast implants. Tell your doctor if you notice a change in your breasts. What you need:  A mirror.  A well-lit room. How to do a breast self-exam A breast self-exam is one way to learn what is normal for your breasts and to check for changes. To do a breast self-exam: Look for changes  1. Take off all the clothes above your waist. 2. Stand in front of a mirror in a room with good lighting. 3. Put your hands on your hips. 4. Push your hands down. 5. Look at your breasts and nipples in the mirror to see if one breast or nipple looks different from the other. Check to see if: ? The shape of one breast is different. ? The size of one breast is different. ? There are wrinkles, dips, and bumps in one breast and not the other. 6. Look at each breast for changes in the skin, such as: ? Redness. ? Scaly areas. 7. Look for changes in your nipples, such as: ? Liquid around the nipples. ? Bleeding. ? Dimpling. ? Redness. ? A change in where the nipples are. Feel for changes  1. Lie on your back on the floor. 2. Feel each breast. To do this, follow these steps: ? Pick a breast to feel. ? Put the arm closest to that breast above your head. ? Use your other arm to feel the nipple area of your breast. Feel the area with the pads of your three middle fingers by making small circles with your fingers. For the first circle, press lightly. For the second circle, press harder. For the third circle, press  even harder. ? Keep making circles with your fingers at the different pressures as you move down your breast. Stop when you feel your ribs. ? Move your fingers a little toward the center of your body. ? Start making circles with your fingers again, this time going up until you reach your collarbone. ? Keep making up-and-down circles until you reach your armpit. Remember to keep using the three pressures. ? Feel the other breast in the same way. 3. Sit or stand in the tub or shower. 4. With soapy water on your skin, feel each breast the same way you did in step 2 when you were lying on the floor. Write down what you find Writing down what you find can help you remember what to tell your doctor. Write down:  What is normal for each breast.  Any changes you find in each breast, including: ? The kind of changes you find. ? Whether you have pain. ? Size and location of any lumps.  When you last had your menstrual period. General tips  Check your breasts every month.  If you are breastfeeding, the best time to check your breasts is after you feed your baby or after you use a breast pump.  If you get menstrual periods, the best time to check your breasts is 5-7 days after your menstrual period is over.  With time, you  will become comfortable with the self-exam, and you will begin to know if there are changes in your breasts. Contact a doctor if you:  See a change in the shape or size of your breasts or nipples.  See a change in the skin of your breast or nipples, such as red or scaly skin.  Have fluid coming from your nipples that is not normal.  Find a lump or thick area that was not there before.  Have pain in your breasts.  Have any concerns about your breast health. Summary  Breast self-awareness includes looking for changes in your breasts, as well as feeling for changes within your breasts.  Breast self-awareness should be done in front of a mirror in a well-lit  room.  You should check your breasts every month. If you get menstrual periods, the best time to check your breasts is 5-7 days after your menstrual period is over.  Let your doctor know of any changes you see in your breasts, including changes in size, changes on the skin, pain or tenderness, or fluid from your nipples that is not normal. This information is not intended to replace advice given to you by your health care provider. Make sure you discuss any questions you have with your health care provider. Document Revised: 01/18/2018 Document Reviewed: 01/18/2018 Elsevier Patient Education  [place].

## 2019-06-19 NOTE — Progress Notes (Signed)
Outpatient Surgical Follow Up  06/19/2019  Leah Holmes is an 86 y.o. female.   No chief complaint on file.   HPI: Leah Holmes is a 86 year old female with known history of breast cancer status post left lumpectomy sentinel lymph node biopsy on November 2019 by Dr. Bary Castilla. Invasive Mammary CA ER/PR + Her -2 Negative. Could not tolerate Anastrozole.  Comes in for physical exam and review of the mammogram.  Mammogram personally reviewed showing no evidence of new concerning lesions.  Stable lumpectomy site. She denies any breast lumps she does complain of some intermittent mild dull pain on her left breast apparently improves if she does not wear a bra.  It is not disabling.  No fevers no chills.   Past Medical History:  Diagnosis Date  . Atrial fibrillation ([place]) 2012  . Breast cancer ([place]) 04/2018   IDC of left breast   . CHF (congestive heart failure) ([place])   . Colon polyp 2003  . Coronary artery disease   . Diabetes mellitus without complication ([place])   . Dyspnea   . Dysrhythmia    atrial fib  . Environmental and seasonal allergies   . GERD (gastroesophageal reflux disease)   . Hyperlipidemia   . Hypertension   . Irritable bowel syndrome   . Polyp of colon   . PONV (postoperative nausea and vomiting)    with ether, not recently "woke up in surgery once"  . Presence of permanent cardiac pacemaker   . Tremor   . Tremors of nervous system   . Ulcer     Past Surgical History:  Procedure Laterality Date  . ABDOMINAL ADHESION SURGERY    . ABDOMINAL HYSTERECTOMY    . APPENDECTOMY    . BREAST BIOPSY Right late 80s/early 90s   benign  . BREAST BIOPSY Left 03/31/2018   [place] bx done with Dr. Bary Castilla, invasive ductal carcinoma  . BREAST LUMPECTOMY Left 04/22/2018   Procedure: BREAST LUMPECTOMY;  Surgeon: Robert Bellow, MD;  Location: [facility] ORS;  Service: General;  Laterality: Left;  . BREAST LUMPECTOMY Left 04/22/2018   [facility], negative LN, clear margins  . BREAST  SURGERY Right    biopsy   . CESAREAN SECTION     x 2  . CHOLECYSTECTOMY  [id number]   Dr Burt Knack  . COLONOSCOPY  2003   Dr Theodosia Paling in [place]  . fibroid tumor    . PACEMAKER INSERTION N/A 01/28/2016   Procedure: INSERTION PACEMAKER;  Surgeon: Isaias Cowman, MD;  Location: [facility] ORS;  Service: Cardiovascular;  Laterality: N/A;  . SENTINEL NODE BIOPSY Left 04/22/2018   Procedure: SENTINEL NODE BIOPSY;  Surgeon: Robert Bellow, MD;  Location: [facility] ORS;  Service: General;  Laterality: Left;  . UPPER GI ENDOSCOPY  2003   Dr Theodosia Paling in [place]    Family History  Problem Relation Age of Onset  . Cancer Sister 56       breast  . Breast cancer Maternal Aunt 68  . Breast cancer Cousin   . Breast cancer Other 45    Social History:  reports that she has never smoked. She has quit using smokeless tobacco.  Her smokeless tobacco use included snuff. She reports that she does not drink alcohol or use drugs.  Allergies:  Allergies  Allergen Reactions  . Codeine Anaphylaxis  . Penicillins Anaphylaxis    Has patient had a PCN reaction causing immediate rash, facial/tongue/throat swelling, SOB or lightheadedness with hypotension: Yes Has patient had a PCN reaction causing severe  rash involving mucus membranes or skin necrosis: No Has patient had a PCN reaction that required hospitalization Yes Has patient had a PCN reaction occurring within the last 10 years: No If all of the above answers are "NO", then may proceed with Cephalosporin use.  . Amlodipine Other (See Comments)    Diffuse itching   . Atorvastatin     Other reaction(s): Unknown  . Clonidine Derivatives Other (See Comments)    Reaction:  Unknown   . Gabapentin Nausea Only  . Losartan Other (See Comments)    Other reaction(s): Other (See Comments) Reaction:  Muscle aches  Reaction:  Muscle aches   . Morphine     Other reaction(s): Unknown  . Morphine And Related Other (See Comments)    Reaction:  Unknown   . Reglan  [Metoclopramide] Other (See Comments)    Reaction:  Tremors   . Hydralazine Anxiety    Medications reviewed.    ROS Full ROS performed and is otherwise negative other than what is stated in HPI   There were no vitals taken for this visit.  Physical Exam Vitals and nursing note reviewed. Exam conducted with a chaperone present.  Constitutional:      Appearance: Normal appearance. She is normal weight.  Cardiovascular:     Rate and Rhythm: Normal rate and regular rhythm.  Pulmonary:     Effort: Pulmonary effort is normal. No respiratory distress.     Breath sounds: Normal breath sounds. No stridor. No wheezing or rhonchi.     Comments: BREAST: lumpectomy and SLNB scars, no new concerning lesions. No LAD Abdominal:     General: Abdomen is flat. There is no distension.     Palpations: Abdomen is soft. There is no mass.     Tenderness: There is no abdominal tenderness.     Hernia: No hernia is present.  Musculoskeletal:     Cervical back: Normal range of motion and neck supple. No rigidity or tenderness.  Lymphadenopathy:     Cervical: No cervical adenopathy.  Skin:    General: Skin is warm and dry.     Capillary Refill: Capillary refill takes less than 2 seconds.  Neurological:     General: No focal deficit present.     Mental Status: She is alert and oriented to person, place, and time.  Psychiatric:        Mood and Affect: Mood normal.        Behavior: Behavior normal.        Thought Content: Thought content normal.        Judgment: Judgment normal.     Assessment/Plan: 86 year old female with history of breast cancer doing well no evidence of recurrence.  We will see her back in 1 year with a physical exam and mammogram.  Discussed with patient detail about her situation and she is very Patent attorney.  No need for any further work-up at this time. Pain not severe enough to warrant any intervention other than prn OTC meds. Greater than 50% of the 25 minutes  visit was  spent in counseling/coordination of care   Caroleen Hamman, MD [place]

## 2019-07-09 ENCOUNTER — Other Ambulatory Visit: Payer: Self-pay | Admitting: Internal Medicine

## 2019-07-16 ENCOUNTER — Other Ambulatory Visit: Payer: Self-pay

## 2019-07-16 ENCOUNTER — Ambulatory Visit: Payer: Medicare Other

## 2019-07-16 ENCOUNTER — Ambulatory Visit
Admission: EM | Admit: 2019-07-16 | Discharge: 2019-07-16 | Disposition: A | Discharge status: Home / Self Care | Payer: Medicare Other | Attending: Family Medicine | Admitting: Family Medicine

## 2019-07-16 DIAGNOSIS — S2231XA Fracture of one rib, right side, initial encounter for closed fracture: Secondary | ICD-10-CM | POA: Insufficient documentation

## 2019-07-16 DIAGNOSIS — W182XXA Fall in (into) shower or empty bathtub, initial encounter: Secondary | ICD-10-CM | POA: Diagnosis not present

## 2019-07-16 DIAGNOSIS — S299XXA Unspecified injury of thorax, initial encounter: Secondary | ICD-10-CM | POA: Diagnosis not present

## 2019-07-16 DIAGNOSIS — R0781 Pleurodynia: Secondary | ICD-10-CM | POA: Diagnosis not present

## 2019-07-16 NOTE — ED Triage Notes (Signed)
Patient states that she fell at her home yesterday. States that she fell in her bathroom and hit the knob of her bathroom door with her back. Patient states that mid right side of her back hurts terribly now.

## 2019-07-16 NOTE — ED Provider Notes (Signed)
[facility]    CSN: [id number] Arrival date & time: 07/16/19  1208      History   Chief Complaint Chief Complaint  Patient presents with  . Fall  . Back Pain    HPI Leah Holmes is a 86 y.o. female.   86 yo female with a c/o right sided rib pain since falling in her bathtub yesterday and hitting her side against the door knob on the bathroom door. Patient states she slipped. Denies hitting her head or loss of consciousness. Denies any chest pressure or shortness of breath.    Fall  Back Pain   Past Medical History:  Diagnosis Date  . Atrial fibrillation ([place]) 2012  . Breast cancer ([place]) 04/2018   IDC of left breast   . CHF (congestive heart failure) ([place])   . Colon polyp 2003  . Coronary artery disease   . Diabetes mellitus without complication ([place])   . Dyspnea   . Dysrhythmia    atrial fib  . Environmental and seasonal allergies   . GERD (gastroesophageal reflux disease)   . Hyperlipidemia   . Hypertension   . Irritable bowel syndrome   . Polyp of colon   . PONV (postoperative nausea and vomiting)    with ether, not recently "woke up in surgery once"  . Presence of permanent cardiac pacemaker   . Tremor   . Tremors of nervous system   . Ulcer     Patient Active Problem List   Diagnosis Date Noted  . Invasive ductal carcinoma of breast, female, left ([place]) 04/26/2018  . Bilateral carotid artery stenosis 04/20/2018  . Atherosclerosis of aorta ([place]) 03/15/2018  . Balance problem 03/15/2018  . Multiple rib fractures 03/15/2018  . Personal history of fall, presenting hazards to health 03/15/2018  . Mass of upper inner quadrant of left breast 03/14/2018  . Fatigue 10/27/2017  . Intention tremor 04/15/2017  . Osteoporosis 03/21/2016  . Sick sinus syndrome ([place]) 01/28/2016  . Other malaise 11/01/2015  . Major depressive disorder, recurrent episode ([place]) 10/30/2015  . Constipation 07/27/2015  . Generalized anxiety disorder  04/02/2015  . Ischemic chest pain ([place]) 01/08/2015  . Atrial fibrillation ([place]) 08/16/2014  . Medicare annual wellness visit, subsequent 05/23/2014  . Chronic pulmonary hypertension ([place]) 05/08/2014  . Hyperlipidemia, mixed 05/08/2014  . Moderate mitral insufficiency 05/08/2014  . Moderate tricuspid insufficiency 05/08/2014  . Dry mouth 03/10/2014  . Low serum vitamin D 02/16/2014  . LVH (left ventricular hypertrophy) 11/23/2013  . Anal polyp 09/20/2013  . Personal history of colonic polyps 08/31/2013  . CAD (coronary artery disease) 03/09/2013  . Diabetes mellitus without complication ([place]) [id number]  . Hyperlipidemia associated with type 2 diabetes mellitus ([place]) 03/09/2013  . Essential hypertension, benign 03/09/2013  . Nonulcer dyspepsia 03/09/2013    Past Surgical History:  Procedure Laterality Date  . ABDOMINAL ADHESION SURGERY    . ABDOMINAL HYSTERECTOMY    . APPENDECTOMY    . BREAST BIOPSY Right late 80s/early 90s   benign  . BREAST BIOPSY Left 03/31/2018   [place] bx done with Dr. Bary Castilla, invasive ductal carcinoma  . BREAST LUMPECTOMY Left 04/22/2018   Procedure: BREAST LUMPECTOMY;  Surgeon: Robert Bellow, MD;  Location: [facility] OR[facility];  Service: General;  Laterality: Left;  . BREAST LUMPECTOMY Left 04/22/2018   [facility], negative LN, clear margins  . BREAST SURGERY Right    biopsy   . CESAREAN SECTION     x 2  . CHOLECYSTECTOMY  03-2012  Dr Burt Knack  . COLONOSCOPY  2003   Dr Theodosia Paling in [place]  . fibroid tumor    . PACEMAKER INSERTION N/A 01/28/2016   Procedure: INSERTION PACEMAKER;  Surgeon: Isaias Cowman, MD;  Location: [facility] OR[facility];  Service: Cardiovascular;  Laterality: N/A;  . SENTINEL NODE BIOPSY Left 04/22/2018   Procedure: SENTINEL NODE BIOPSY;  Surgeon: Robert Bellow, MD;  Location: [facility];  Service: General;  Laterality: Left;  . UPPER GI ENDOSCOPY  2003   Dr Theodosia Paling in [place]    OB History    Gravida  2   Para  2   Term      Preterm      AB       Living        SAB      TAB      Ectopic      Multiple      Live Births           Obstetric Comments  1st Menstrual Cycle:  16 1st Pregnancy:  30         Home Medications    Prior to Admission medications   Medication Sig Start Date End Date Taking? Authorizing Provider  amLODipine (NORVASC) 5 MG tablet TAKE ONE TABLET (5 mg) BY MOUTH every morning 10/12/18  Yes Crecencio Mc, MD  atenolol (TENORMIN) 50 MG tablet Take 1 tablet by mouth twice daily 07/10/19  Yes Crecencio Mc, MD  Cholecalciferol (VITAMIN D3) 1000 UNITS CAPS Take 1,000 Units by mouth every morning.    Yes [provider]  glucose blood (ONE TOUCH ULTRA TEST) test strip USE 1 STRIP TO CHECK GLUCOSE TWICE DAILY  Dx code: E11.9 05/29/19  Yes Crecencio Mc, MD  glucose blood (ONE TOUCH ULTRA TEST) test strip USE ONE STRIP TO CHECK GLUCOSE ONCE  DAILY 06/02/19  Yes Crecencio Mc, MD  hydrochlorothiazide (HYDRODIURIL) 25 MG tablet Take 25 mg by mouth daily.  12/12/15  Yes [provider]  isosorbide mononitrate (IMDUR) 60 MG 24 hr tablet Take 60 mg by mouth 2 (two) times daily. 12/27/15  Yes [provider]  Lancets (ONETOUCH DELICA PLUS 123XX123) [place] 2 Devices into the skin daily. DX Code E11.9. 01/30/19  Yes Crecencio Mc, MD  lisinopril (ZESTRIL) 20 MG tablet Take 2 tablets (40 mg total) by mouth 2 (two) times daily. 04/27/19  Yes Crecencio Mc, MD  meclizine (ANTIVERT) 25 MG tablet Take 1 tablet by mouth three times daily as needed 06/02/19  Yes Crecencio Mc, MD  nitroGLYCERIN (NITROSTAT) 0.4 MG SL tablet Place 0.4 mg under the tongue every 5 (five) minutes as needed for chest pain.   Yes [provider]  omeprazole (PRILOSEC) 20 MG capsule Take 1 capsule by mouth once daily 05/31/19  Yes Crecencio Mc, MD  [employer] ULTRA test strip USE 1 STRIP TO CHECK GLUCOSE TWICE DAILY 05/15/19  Yes Crecencio Mc, MD  Polyethyl Glycol-Propyl Glycol 0.4-0.3 %  SOLN Place 1 drop into both eyes 3 (three) times daily as needed (for dry/irritated eyes.).   Yes [provider]  warfarin (COUMADIN) 4 MG tablet Take 4 mg by mouth daily at 6 PM.   Yes [provider]  albuterol (PROAIR HFA) 108 (90 Base) MCG/ACT inhaler Inhale 1-2 puffs into the lungs every 4 (four) hours as needed for wheezing or shortness of breath. 11/17/17   Laverle Hobby, MD  glipiZIDE (GLUCOTROL) 5 MG tablet TAKE 1/2 (ONE-HALF) TABLET  BY MOUTH ONCE DAILY BEFORE SUPPER 06/12/19   Crecencio Mc, MD  psyllium (METAMUCIL) 58.6 % packet Take 1 packet by mouth daily as needed (for regularity).     [provider]    Family History Family History  Problem Relation Age of Onset  . Cancer Sister 94       breast  . Breast cancer Maternal Aunt 68  . Breast cancer Cousin   . Breast cancer Other 28    Social History Social History   Tobacco Use  . Smoking status: Never Smoker  . Smokeless tobacco: Former Systems developer    Types: Snuff  . Tobacco comment: occasionally  Substance Use Topics  . Alcohol use: No  . Drug use: No     Allergies   Codeine, Penicillins, Amlodipine, Atorvastatin, Clonidine derivatives, Gabapentin, Losartan, Morphine, Morphine and related, Reglan [metoclopramide], and Hydralazine   Review of Systems Review of Systems  Musculoskeletal: Positive for back pain.     Physical Exam Triage Vital Signs ED Triage Vitals  Enc Vitals Group     BP 07/16/19 1227 (!) 156/79     Pulse Rate 07/16/19 1227 64     Resp 07/16/19 1227 18     Temp 07/16/19 1227 98.4 F (36.9 C)     Temp Source 07/16/19 1227 Oral     SpO2 07/16/19 1227 98 %     Weight 07/16/19 1223 130 lb (59 kg)     Height 07/16/19 1223 5\' 6"  (1.676 m)     Head Circumference --      Peak Flow --      Pain Score 07/16/19 1223 10     Pain Loc --      Pain Edu? --      Excl. in [place]? --    No data found.  Updated Vital Signs BP (!) 156/79 (BP Location: Left Arm)   Pulse  64   Temp 98.4 F (36.9 C) (Oral)   Resp 18   Ht 5\' 6"  (1.676 m)   Wt 59 kg   SpO2 98%   BMI 20.98 kg/m   Visual Acuity Right Eye Distance:   Left Eye Distance:   Bilateral Distance:    Right Eye Near:   Left Eye Near:    Bilateral Near:     Physical Exam Vitals and nursing note reviewed.  Constitutional:      General: She is not in acute distress.    Appearance: She is not toxic-appearing or diaphoretic.  Cardiovascular:     Rate and Rhythm: Normal rate.  Pulmonary:     Effort: Pulmonary effort is normal. No respiratory distress.     Breath sounds: Normal breath sounds.  Chest:     Chest wall: Tenderness (right mid-lower posteo lateral ribs) present.  Neurological:     Mental Status: She is alert.      UC Treatments / Results  Labs (all labs ordered are listed, but only abnormal results are displayed) Labs Reviewed - No data to display  EKG   Radiology DG Ribs Unilateral W/Chest Right  Result Date: 07/16/2019 CLINICAL DATA:  Right rib pain and back pain since fall yesterday. EXAM: RIGHT RIBS AND CHEST - 3+ VIEW COMPARISON:  02/12/2019 FINDINGS: Left-sided pacemaker unchanged. Lungs are hyperexpanded without consolidation, effusion or pneumothorax. Increased lucency of the lungs likely emphysematous disease. Possible subtle fracture involving the anterolateral aspect of a lower right rib likely the eighth rib. IMPRESSION: No acute cardiopulmonary disease.  Emphysematous disease. Possible subtle  right anterolateral lower rib fracture possibly the eighth rib. Electronically Signed   By: Marin Olp M.D.   On: 07/16/2019 13:06    Procedures Procedures (including critical care time)  Medications Ordered in UC Medications - No data to display  Initial Impression / Assessment and Plan / UC Course  I have reviewed the triage vital signs and the nursing notes.  Pertinent labs & imaging results that were available during my care of the patient were reviewed by me  and considered in my medical decision making (see chart for details).      Final Clinical Impressions(s) / UC Diagnoses   Final diagnoses:  Closed fracture of one rib of right side, initial encounter     Discharge Instructions     Ice, tylenol    ED Prescriptions    None      1. x-ray results and diagnosis reviewed with patient and daughter 2.. Recommend supportive treatment as above; offered pain medication however patient declined  3. Follow-up prn if symptoms worsen or don't improve   PDMP not reviewed this encounter.   Norval Gable, MD 07/16/19 1326

## 2019-07-16 NOTE — Discharge Instructions (Addendum)
Ice, tylenol

## 2019-07-25 DIAGNOSIS — I482 Chronic atrial fibrillation, unspecified: Secondary | ICD-10-CM | POA: Diagnosis not present

## 2019-07-26 ENCOUNTER — Other Ambulatory Visit: Payer: Self-pay | Admitting: Internal Medicine

## 2019-08-24 DIAGNOSIS — I482 Chronic atrial fibrillation, unspecified: Secondary | ICD-10-CM | POA: Diagnosis not present

## 2019-09-03 ENCOUNTER — Other Ambulatory Visit: Payer: Self-pay | Admitting: Internal Medicine

## 2019-09-04 ENCOUNTER — Other Ambulatory Visit: Payer: Self-pay

## 2019-09-06 ENCOUNTER — Other Ambulatory Visit: Payer: Self-pay

## 2019-09-06 ENCOUNTER — Encounter: Payer: Self-pay | Admitting: Internal Medicine

## 2019-09-06 ENCOUNTER — Ambulatory Visit (INDEPENDENT_AMBULATORY_CARE_PROVIDER_SITE_OTHER): Payer: Medicare Other | Admitting: Internal Medicine

## 2019-09-06 VITALS — BP 180/82 | HR 64 | Temp 98.1°F | Resp 15 | Ht 66.0 in | Wt 131.2 lb

## 2019-09-06 DIAGNOSIS — I209 Angina pectoris, unspecified: Secondary | ICD-10-CM | POA: Diagnosis not present

## 2019-09-06 DIAGNOSIS — I10 Essential (primary) hypertension: Secondary | ICD-10-CM

## 2019-09-06 DIAGNOSIS — L299 Pruritus, unspecified: Secondary | ICD-10-CM | POA: Diagnosis not present

## 2019-09-06 DIAGNOSIS — E785 Hyperlipidemia, unspecified: Secondary | ICD-10-CM | POA: Diagnosis not present

## 2019-09-06 DIAGNOSIS — E1169 Type 2 diabetes mellitus with other specified complication: Secondary | ICD-10-CM

## 2019-09-06 DIAGNOSIS — S2231XD Fracture of one rib, right side, subsequent encounter for fracture with routine healing: Secondary | ICD-10-CM

## 2019-09-06 DIAGNOSIS — J3089 Other allergic rhinitis: Secondary | ICD-10-CM | POA: Diagnosis not present

## 2019-09-06 DIAGNOSIS — I482 Chronic atrial fibrillation, unspecified: Secondary | ICD-10-CM

## 2019-09-06 DIAGNOSIS — E119 Type 2 diabetes mellitus without complications: Secondary | ICD-10-CM

## 2019-09-06 DIAGNOSIS — Z950 Presence of cardiac pacemaker: Secondary | ICD-10-CM

## 2019-09-06 MED ORDER — TRIAMCINOLONE ACETONIDE 0.1 % EX CREA: 1.0000 "application " | TOPICAL_CREAM | Freq: Two times a day (BID) | CUTANEOUS | 0 refills | Status: DC

## 2019-09-06 NOTE — Patient Instructions (Addendum)
Fexofenadine is generic for allegra,  It can be taken 1 to 4 times daily as needed for allergies and itching  You can use the triamcinolone cream  in your ear once daily and on your back  Where you itch    Get back on amlodipine because your blood pressure is too high  Drink your water earlier in the day ,  Before 6 pm so it won't keep you up at night   You can take the potassium substitute pill 2 times daily with food   Ok to use mucinex  If needed (or a steam bath or Vick's Vapo rub)

## 2019-09-06 NOTE — Progress Notes (Signed)
Subjective:  Patient ID: Leah Holmes, female    DOB: [birth year]  Age: 86 y.o. MRN: [id number]  CC: The primary encounter diagnosis was Diabetes mellitus without complication ([place]). 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  HPI Leah Holmes presents for follow up on type 2 DM, GAD, hypertension .  She is accompanied by her daughter [name].   She had a fall recently while getting out of the  bathtub and broke a rib on the bathroom doorknob.  ER records reviewed which included imaging studies of chest only.  No labs or head CT were done.   .she was discharged home and is recovering without  pain.  T2DM/anxiety about health:  She has finally stopped checking her  blood sugars twice daily but continues to endorse feeling poorly all the time.  Her appetite is good and her weight is stable.  She has not had any chest pain or shortness of breath.   Allergic rhinitis:   nose itching,  draining,  Coughing at night.  Denies dyspnea, fevers,  Body aches.  Does not leave the house except to see the doctor.   Ears itching and lower back itching along the elastic band of her underwear.   Outpatient Medications Prior to Visit  Medication Sig Dispense Refill  . albuterol (PROAIR HFA) 108 (90 Base) MCG/ACT inhaler Inhale 1-2 puffs into the lungs every 4 (four) hours as needed for wheezing or shortness of breath. 1 Inhaler 5  . amLODipine (NORVASC) 5 MG tablet TAKE ONE TABLET (5 mg) BY MOUTH every morning 90 tablet 3  . atenolol (TENORMIN) 50 MG tablet Take 1 tablet by mouth twice daily 180 tablet 1  . Cholecalciferol (VITAMIN D3) 1000 UNITS CAPS Take  1,000 Units by mouth every morning.     Marland Kitchen glucose blood (ONE TOUCH ULTRA TEST) test strip USE 1 STRIP TO CHECK GLUCOSE TWICE DAILY  Dx code: E11.9 200 each 3  . glucose blood (ONE TOUCH ULTRA TEST) test strip USE ONE STRIP TO CHECK GLUCOSE ONCE  DAILY 100 each 3  . hydrochlorothiazide (HYDRODIURIL) 25 MG tablet Take 25 mg by mouth daily.     . isosorbide mononitrate (IMDUR) 60 MG 24 hr tablet Take 60 mg by mouth 2 (two) times daily.    . Lancets (ONETOUCH DELICA PLUS 123XX123) MISC Inject 2 Devices into the skin daily. DX Code E11.9. 200 each 2  . lisinopril (ZESTRIL) 20 MG tablet Take 2 tablets by mouth twice daily 180 tablet 0  . meclizine (ANTIVERT) 25 MG tablet Take 1 tablet by mouth three times daily as needed 90 tablet 0  . nitroGLYCERIN (NITROSTAT) 0.4 MG SL tablet Place 0.4 mg under the tongue every 5 (five) minutes as needed for chest pain.    Marland Kitchen omeprazole (PRILOSEC) 20 MG capsule Take 1 capsule by mouth once daily 90 capsule 1  . ONETOUCH ULTRA test strip USE 1 STRIP TO CHECK GLUCOSE TWICE DAILY 100 each 0  . Polyethyl Glycol-Propyl Glycol 0.4-0.3 % SOLN Place 1 drop into both eyes 3 (three) times daily as needed (for dry/irritated eyes.).    Marland Kitchen warfarin (COUMADIN) 4 MG tablet Take 4 mg by mouth daily at 6 PM.    .  psyllium (METAMUCIL) 58.6 % packet Take 1 packet by mouth daily as needed (for regularity).     Marland Kitchen glipiZIDE (GLUCOTROL) 5 MG tablet TAKE 1/2 (ONE-HALF) TABLET BY MOUTH ONCE DAILY BEFORE SUPPER (Patient not taking: Reported on 09/06/2019) 45 tablet 0   No facility-administered medications prior to visit.    Review of Systems;  Patient denies headache, fevers, malaise, unintentional weight loss, skin rash, eye pain, sinus congestion and sinus pain, sore throat, dysphagia,  hemoptysis , , dyspnea, wheezing, chest pain, palpitations, orthopnea, edema, abdominal pain, nausea, melena, diarrhea, constipation, flank pain, dysuria, hematuria, urinary  Frequency, nocturia, numbness,  tingling, seizures,  Focal weakness, Loss of consciousness,  Tremor, insomnia, depression, anxiety, and suicidal ideation.      Objective:  BP (!) 180/82 (BP Location: Left Arm, Patient Position: Sitting, Cuff Size: Normal)   Pulse 64   Temp 98.1 F (36.7 C) (Temporal)   Resp 15   Ht 5\' 6"  (1.676 m)   Wt 131 lb 3.2 oz (59.5 kg)   SpO2 97%   BMI 21.18 kg/m   BP Readings from Last 3 Encounters:  09/06/19 (!) 180/82  07/16/19 (!) 156/79  06/19/19 (!) 172/64    Wt Readings from Last 3 Encounters:  09/06/19 131 lb 3.2 oz (59.5 kg)  07/16/19 130 lb (59 kg)  06/19/19 132 lb 9.6 oz (60.1 kg)    General appearance: alert, cooperative and appears stated age Ears: normal TM's and external ear canals both ears Throat: lips, mucosa, and tongue normal; teeth and gums normal Neck: no adenopathy, no carotid bruit, supple, symmetrical, trachea midline and thyroid not enlarged, symmetric, no tenderness/mass/nodules Back: symmetric, no curvature. ROM normal. No CVA tenderness. Lungs: clear to auscultation bilaterally with good bilateral expansion  Heart: regular rate and rhythm, S1, S2 normal, no murmur, click, rub or gallop Abdomen: soft, non-tender; bowel sounds normal; no masses,  no organomegaly Pulses: 2+ and symmetric Skin: Skin color, texture, turgor normal. Small red macular patch at top of natal cleft c/w contact dermatitis  From underwear tag Lymph nodes: Cervical, supraclavicular, and axillary nodes normal.  Lab Results  Component Value Date   HGBA1C 7.0 (H) 09/06/2019   HGBA1C 6.4 04/03/2019   HGBA1C 6.2 07/22/2018    Lab Results  Component Value Date   CREATININE 0.72 09/06/2019   CREATININE 0.66 04/03/2019   CREATININE 0.51 12/12/2018    Lab Results  Component Value Date   WBC 7.2 09/06/2019   HGB 14.2 09/06/2019   HCT 41.9 09/06/2019   PLT 205.0 09/06/2019   GLUCOSE 131 (H) 09/06/2019   CHOL 204 (H) 10/30/2015   TRIG 94.0 10/30/2015   HDL 57.80 10/30/2015    LDLDIRECT 116.0 10/30/2015   LDLCALC 128 (H) 10/30/2015   ALT 16 09/06/2019   AST 21 09/06/2019   NA 131 (L) 09/06/2019   K 3.9 09/06/2019   CL 96 09/06/2019   CREATININE 0.72 09/06/2019   BUN 12 09/06/2019   CO2 31 09/06/2019   TSH 0.80 04/03/2019   INR 2.1 (H) 12/12/2018   HGBA1C 7.0 (H) 09/06/2019   MICROALBUR <0.7 04/14/2017    DG Ribs Unilateral W/Chest Right  Result Date: 07/16/2019 CLINICAL DATA:  Right rib pain and back pain since fall yesterday. EXAM: RIGHT RIBS AND CHEST - 3+ VIEW COMPARISON:  02/12/2019 FINDINGS: Left-sided pacemaker unchanged. Lungs are hyperexpanded without consolidation, effusion or pneumothorax. Increased lucency of the lungs likely emphysematous disease. Possible subtle fracture involving the anterolateral aspect of a lower right rib likely  the eighth rib. IMPRESSION: No acute cardiopulmonary disease.  Emphysematous disease. Possible subtle right anterolateral lower rib fracture possibly the eighth rib. Electronically Signed   By: Marin Olp M.D.   On: 07/16/2019 13:06    Assessment & Plan:   Problem List Items Addressed This Visit      Unprioritized   Diabetes mellitus without complication ([place]) - Primary   Relevant Orders   Hemoglobin A1c (Completed)   Hyperlipidemia associated with type 2 diabetes mellitus ([place])    Managed with generic crestor with good toleration .  LFTS are normal.   Lab Results  Component Value Date   CHOL 204 (H) 10/30/2015   HDL 57.80 10/30/2015   LDLCALC 128 (H) 10/30/2015   LDLDIRECT 116.0 10/30/2015   TRIG 94.0 10/30/2015   CHOLHDL 4 10/30/2015   Lab Results  Component Value Date   ALT 16 09/06/2019   AST 21 09/06/2019   ALKPHOS 66 09/06/2019   BILITOT 0.8 09/06/2019         Essential hypertension, benign    she reports compliance with medication regimen (5 drug therapy) but has not been taking amlodipine.  Advised to resume medication for elevated reading today in office.  She is not using NSAIDs  daily. H      Relevant Orders   Comprehensive metabolic panel (Completed)   Chronic atrial fibrillation ([facility])    S/p pacemaker for SSS ,  Rate controlled with metoprolol and anticoagulated with warfarin.  She has had another minor fall at home.  Advised to consider stopping warfarin .      Ischemic chest pain ([place])    Managed with Imdur and prn NTG , none used recently       Closed rib fracture    Secondary to traumatic fall in late January. Pain has resolved.       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  Triamcinolone ointment prescribed.        Other Visit Diagnoses    Itching       Relevant Orders   CBC with Differential/Platelet (Completed)      I provided  30 minutes of  face-to-face time during this encounter reviewing patient's current problems and past surgeries, labs and imaging studies, providing counseling on the above mentioned problems , and coordination  of care .  I have discontinued Jenia L. Prieto's glipiZIDE. I am also having her start on triamcinolone cream. Additionally, I am having her maintain her nitroGLYCERIN, psyllium, Vitamin D3, isosorbide mononitrate, hydrochlorothiazide, albuterol, Polyethyl Glycol-Propyl Glycol, amLODipine, warfarin, OneTouch Delica Plus 0000000, OneTouch Ultra, glucose blood, omeprazole, meclizine, glucose blood, atenolol, and lisinopril.  Meds ordered this encounter  Medications  . triamcinolone cream (KENALOG) 0.1 %    Sig: Apply 1 application topically 2 (two) times daily.    Dispense:  30 g    Refill:  0    Medications Discontinued During This Encounter  Medication Reason  . glipiZIDE (GLUCOTROL) 5 MG tablet Patient has not taken in last 30 days    Follow-up: No follow-ups on file.   Crecencio Mc, MD

## 2019-09-07 LAB — COMPREHENSIVE METABOLIC PANEL
ALT: 16 U/L (ref 0–35)
AST: 21 U/L (ref 0–37)
Albumin: 3.9 g/dL (ref 3.5–5.2)
Alkaline Phosphatase: 66 U/L (ref 39–117)
BUN: 12 mg/dL (ref 6–23)
CO2: 31 mEq/L (ref 19–32)
Calcium: 9.4 mg/dL (ref 8.4–10.5)
Chloride: 96 mEq/L (ref 96–112)
Creatinine, Ser: 0.72 mg/dL (ref 0.40–1.20)
GFR: 75.92 mL/min (ref >60.00)
Glucose, Bld: 131 mg/dL — ABNORMAL HIGH (ref 70–99)
Potassium: 3.9 mEq/L (ref 3.5–5.1)
Sodium: 131 mEq/L — ABNORMAL LOW (ref 135–145)
Total Bilirubin: 0.8 mg/dL (ref 0.2–1.2)
Total Protein: 6.8 g/dL (ref 6.0–8.3)

## 2019-09-07 LAB — HEMOGLOBIN A1C: Hgb A1c MFr Bld: 7 % — ABNORMAL HIGH (ref 4.6–6.5)

## 2019-09-07 LAB — CBC WITH DIFFERENTIAL/PLATELET
Basophils Absolute: 0 10*3/uL (ref 0.0–0.1)
Basophils Relative: 0.4 % (ref 0.0–3.0)
Eosinophils Absolute: 0.1 10*3/uL (ref 0.0–0.7)
Eosinophils Relative: 1.5 % (ref 0.0–5.0)
HCT: 41.9 % (ref 36.0–46.0)
Hemoglobin: 14.2 g/dL (ref 12.0–15.0)
Lymphocytes Relative: 18.4 % (ref 12.0–46.0)
Lymphs Abs: 1.3 10*3/uL (ref 0.7–4.0)
MCHC: 33.8 g/dL (ref 30.0–36.0)
MCV: 93.7 fl (ref 78.0–100.0)
Monocytes Absolute: 0.6 10*3/uL (ref 0.1–1.0)
Monocytes Relative: 7.8 % (ref 3.0–12.0)
Neutro Abs: 5.2 10*3/uL (ref 1.4–7.7)
Neutrophils Relative %: 71.9 % (ref 43.0–77.0)
Platelets: 205 10*3/uL (ref 150.0–400.0)
RBC: 4.47 Mil/uL (ref 3.87–5.11)
RDW: 13.9 % (ref 11.5–15.5)
WBC: 7.2 10*3/uL (ref 4.0–10.5)

## 2019-09-08 ENCOUNTER — Encounter: Payer: Self-pay | Admitting: Internal Medicine

## 2019-09-08 DIAGNOSIS — S2239XA Fracture of one rib, unspecified side, initial encounter for closed fracture: Secondary | ICD-10-CM | POA: Insufficient documentation

## 2019-09-08 DIAGNOSIS — Z950 Presence of cardiac pacemaker: Secondary | ICD-10-CM | POA: Insufficient documentation

## 2019-09-08 DIAGNOSIS — J3089 Other allergic rhinitis: Secondary | ICD-10-CM | POA: Insufficient documentation

## 2019-09-08 DIAGNOSIS — L299 Pruritus, unspecified: Secondary | ICD-10-CM | POA: Insufficient documentation

## 2019-09-08 NOTE — Assessment & Plan Note (Signed)
Managed with Imdur and prn NTG , none used recently

## 2019-09-08 NOTE — Assessment & Plan Note (Signed)
Managed with generic crestor with good toleration .  LFTS are normal.   Lab Results  Component Value Date   CHOL 204 (H) 10/30/2015   HDL 57.80 10/30/2015   LDLCALC 128 (H) 10/30/2015   LDLDIRECT 116.0 10/30/2015   TRIG 94.0 10/30/2015   CHOLHDL 4 10/30/2015   Lab Results  Component Value Date   ALT 16 09/06/2019   AST 21 09/06/2019   ALKPHOS 66 09/06/2019   BILITOT 0.8 09/06/2019

## 2019-09-08 NOTE — Assessment & Plan Note (Signed)
Secondary to traumatic fall in late January. Pain has resolved.

## 2019-09-08 NOTE — Assessment & Plan Note (Signed)
She has a contact dermatitis on her her back as well as eczema of ears.  Triamcinolone ointment prescribed.

## 2019-09-08 NOTE — Assessment & Plan Note (Signed)
S/p pacemaker for SSS ,  Rate controlled with metoprolol and anticoagulated with warfarin.  She has had another minor fall at home.  Advised to consider stopping warfarin .

## 2019-09-08 NOTE — Assessment & Plan Note (Addendum)
she reports compliance with medication regimen (5 drug therapy) but has not been taking amlodipine.  Advised to resume medication for elevated reading today in office.  She is not using NSAIDs daily. H

## 2019-09-08 NOTE — Assessment & Plan Note (Signed)
Addressed today with antihistamine

## 2019-09-21 DIAGNOSIS — I482 Chronic atrial fibrillation, unspecified: Secondary | ICD-10-CM | POA: Diagnosis not present

## 2019-10-24 DIAGNOSIS — I480 Paroxysmal atrial fibrillation: Secondary | ICD-10-CM | POA: Diagnosis not present

## 2019-10-27 ENCOUNTER — Other Ambulatory Visit: Payer: Self-pay | Admitting: Internal Medicine

## 2019-11-07 DIAGNOSIS — I272 Pulmonary hypertension, unspecified: Secondary | ICD-10-CM | POA: Diagnosis not present

## 2019-11-07 DIAGNOSIS — E782 Mixed hyperlipidemia: Secondary | ICD-10-CM | POA: Diagnosis not present

## 2019-11-07 DIAGNOSIS — I10 Essential (primary) hypertension: Secondary | ICD-10-CM | POA: Diagnosis not present

## 2019-11-07 DIAGNOSIS — I6523 Occlusion and stenosis of bilateral carotid arteries: Secondary | ICD-10-CM | POA: Diagnosis not present

## 2019-11-07 DIAGNOSIS — I2510 Atherosclerotic heart disease of native coronary artery without angina pectoris: Secondary | ICD-10-CM | POA: Diagnosis not present

## 2019-11-07 DIAGNOSIS — I495 Sick sinus syndrome: Secondary | ICD-10-CM | POA: Diagnosis not present

## 2019-11-07 DIAGNOSIS — I482 Chronic atrial fibrillation, unspecified: Secondary | ICD-10-CM | POA: Diagnosis not present

## 2019-11-08 ENCOUNTER — Other Ambulatory Visit: Payer: Self-pay | Admitting: Internal Medicine

## 2019-11-08 ENCOUNTER — Ambulatory Visit (INDEPENDENT_AMBULATORY_CARE_PROVIDER_SITE_OTHER): Payer: Medicare Other

## 2019-11-08 VITALS — Ht 66.0 in | Wt 131.0 lb

## 2019-11-08 DIAGNOSIS — Z0000 Encounter for general adult medical examination without abnormal findings: Secondary | ICD-10-CM | POA: Diagnosis not present

## 2019-11-08 NOTE — Patient Instructions (Addendum)
  Leah Holmes , Thank you for taking time to come for your Medicare Wellness Visit. I appreciate your ongoing commitment to your health goals. Please review the following plan we discussed and let me know if I can assist you in the future.   These are the goals we discussed: Goals    . Follow up with Primary Care Provider     As needed       This is a list of the screening recommended for you and due dates:  Health Maintenance  Topic Date Due  . COVID-19 Vaccine (1) Never done  . Complete foot exam   04/14/2018  . Eye exam for diabetics  12/26/2019  . Flu Shot  01/14/2020  . Hemoglobin A1C  03/08/2020  . Tetanus Vaccine  04/07/2022  . DEXA scan (bone density measurement)  Completed  . Pneumonia vaccines  Completed

## 2019-11-08 NOTE — Progress Notes (Addendum)
Subjective:   Leah Holmes is a 86 y.o. female who presents for Medicare Annual (Subsequent) preventive examination.  Review of Systems:  No ROS.  Medicare Wellness Virtual Visit.  Visual/audio telehealth visit, UTA vital signs.   See social history for additional risk factors.   Cardiac Risk Factors include: advanced age (>56men, >40 women);hypertension;diabetes mellitus     Objective:     Vitals: Ht 5\' 6"  (1.676 m)   Wt 131 lb (59.4 kg)   BMI 21.14 kg/m   Body mass index is 21.14 kg/m.  Advanced Directives 11/08/2019 07/16/2019 02/12/2019 12/12/2018 09/27/2018 04/08/2018 03/07/2018  Does Patient Have a Medical Advance Directive? Yes No Yes No Yes Yes Yes  Type of International aid/development worker - Healthcare Power of [place];Living will [place] -  Does patient want to make changes to medical advance directive? No - Patient declined - - - - - No - Patient declined  Copy of [place] in Chart? No - copy requested - - - - - -  Would patient like information on creating a medical advance directive? - - - - - - No - Patient declined    Tobacco Social History   Tobacco Use  Smoking Status Never Smoker  Smokeless Tobacco Former Systems developer  . Types: Snuff  Tobacco Comment   occasionally     Counseling given: Not Answered Comment: occasionally   Clinical Intake:  Pre-visit preparation completed: Yes        Diabetes: Yes(Followed by pcp. Diet controlled.)  How often do you need to have someone help you when you read instructions, pamphlets, or other written materials from your doctor or pharmacy?: 3 - Sometimes  Interpreter Needed?: No     Past Medical History:  Diagnosis Date  . Atrial fibrillation ([place]) 2012  . Breast cancer ([place]) 04/2018   IDC of left breast   . CHF (congestive heart failure) ([place])   . Colon polyp 2003  . Coronary artery disease   . Diabetes mellitus  without complication ([place])   . Dyspnea   . Dysrhythmia    atrial fib  . Environmental and seasonal allergies   . GERD (gastroesophageal reflux disease)   . Hyperlipidemia   . Hypertension   . Irritable bowel syndrome   . Multiple rib fractures 03/15/2018  . Polyp of colon   . PONV (postoperative nausea and vomiting)    with ether, not recently "woke up in surgery once"  . Presence of permanent cardiac pacemaker   . Tremor   . Tremors of nervous system   . Ulcer    Past Surgical History:  Procedure Laterality Date  . ABDOMINAL ADHESION SURGERY    . ABDOMINAL HYSTERECTOMY    . APPENDECTOMY    . BREAST BIOPSY Right late 80s/early 90s   benign  . BREAST BIOPSY Left 03/31/2018   [place] bx done with Dr. Bary Castilla, invasive ductal carcinoma  . BREAST LUMPECTOMY Left 04/22/2018   Procedure: BREAST LUMPECTOMY;  Surgeon: Robert Bellow, MD;  Location: [facility] ORS;  Service: General;  Laterality: Left;  . BREAST LUMPECTOMY Left 04/22/2018   [facility], negative LN, clear margins  . BREAST SURGERY Right    biopsy   . CESAREAN SECTION     x 2  . CHOLECYSTECTOMY  [id number]   Dr Burt Knack  . COLONOSCOPY  2003   Dr Theodosia Paling in [place]  . fibroid tumor    . PACEMAKER INSERTION N/A  01/28/2016   Procedure: INSERTION PACEMAKER;  Surgeon: Isaias Cowman, MD;  Location: [facility] ORS;  Service: Cardiovascular;  Laterality: N/A;  . SENTINEL NODE BIOPSY Left 04/22/2018   Procedure: SENTINEL NODE BIOPSY;  Surgeon: Robert Bellow, MD;  Location: [facility] ORS;  Service: General;  Laterality: Left;  . UPPER GI ENDOSCOPY  2003   Dr Theodosia Paling in [place]   Family History  Problem Relation Age of Onset  . Cancer Sister 32       breast  . Breast cancer Maternal Aunt 68  . Breast cancer Cousin   . Breast cancer Other 61   Social History   Socioeconomic History  . Marital status: Widowed    Spouse name: Not on file  . Number of children: Not on file  . Years of education: Not on file  . Highest education level:  Not on file  Occupational History  . Not on file  Tobacco Use  . Smoking status: Never Smoker  . Smokeless tobacco: Former Systems developer    Types: Snuff  . Tobacco comment: occasionally  Substance and Sexual Activity  . Alcohol use: No  . Drug use: No  . Sexual activity: Never  Other Topics Concern  . Not on file  Social History Narrative  . Not on file   Social Determinants of Health   Financial Resource Strain:   . Difficulty of Paying Living Expenses:   Food Insecurity:   . Worried About Charity fundraiser in the Last Year:   . Arboriculturist in the Last Year:   Transportation Needs:   . Film/video editor (Medical):   Marland Kitchen Lack of Transportation (Non-Medical):   Physical Activity:   . Days of Exercise per Week:   . Minutes of Exercise per Session:   Stress:   . Feeling of Stress :   Social Connections:   . Frequency of Communication with Friends and Family:   . Frequency of Social Gatherings with Friends and Family:   . Attends Religious Services:   . Active Member of Clubs or Organizations:   . Attends Archivist Meetings:   Marland Kitchen Marital Status:     Outpatient Encounter Medications as of 11/08/2019  Medication Sig  . albuterol (PROAIR HFA) 108 (90 Base) MCG/ACT inhaler Inhale 1-2 puffs into the lungs every 4 (four) hours as needed for wheezing or shortness of breath.  Marland Kitchen amLODipine (NORVASC) 5 MG tablet TAKE ONE TABLET (5 mg) BY MOUTH every morning  . atenolol (TENORMIN) 50 MG tablet Take 1 tablet by mouth twice daily  . Cholecalciferol (VITAMIN D3) 1000 UNITS CAPS Take 1,000 Units by mouth every morning.   Marland Kitchen glucose blood (ONE TOUCH ULTRA TEST) test strip USE 1 STRIP TO CHECK GLUCOSE TWICE DAILY  Dx code: E11.9  . glucose blood (ONE TOUCH ULTRA TEST) test strip USE ONE STRIP TO CHECK GLUCOSE ONCE  DAILY  . hydrochlorothiazide (HYDRODIURIL) 25 MG tablet Take 25 mg by mouth daily.   . isosorbide mononitrate (IMDUR) 60 MG 24 hr tablet Take 60 mg by mouth 2 (two)  times daily.  . Lancets (ONETOUCH DELICA PLUS 123XX123) MISC Inject 2 Devices into the skin daily. DX Code E11.9.  . lisinopril (ZESTRIL) 20 MG tablet Take 2 tablets by mouth twice daily  . meclizine (ANTIVERT) 25 MG tablet Take 1 tablet by mouth three times daily as needed  . nitroGLYCERIN (NITROSTAT) 0.4 MG SL tablet Place 0.4 mg under the tongue every 5 (five) minutes as needed  for chest pain.  Marland Kitchen omeprazole (PRILOSEC) 20 MG capsule Take 1 capsule by mouth once daily  . ONETOUCH ULTRA test strip USE 1 STRIP TO CHECK GLUCOSE TWICE DAILY  . Polyethyl Glycol-Propyl Glycol 0.4-0.3 % SOLN Place 1 drop into both eyes 3 (three) times daily as needed (for dry/irritated eyes.).  Marland Kitchen psyllium (METAMUCIL) 58.6 % packet Take 1 packet by mouth daily as needed (for regularity).   . triamcinolone cream (KENALOG) 0.1 % Apply 1 application topically 2 (two) times daily.  Marland Kitchen warfarin (COUMADIN) 4 MG tablet Take 4 mg by mouth daily at 6 PM.   No facility-administered encounter medications on file as of 11/08/2019.    Activities of Daily Living In your present state of health, do you have any difficulty performing the following activities: 11/08/2019  Hearing? N  Vision? N  Difficulty concentrating or making decisions? N  Walking or climbing stairs? N  Dressing or bathing? N  Doing errands, shopping? Y  Comment She does not Physiological scientist and eating ? Y  Comment Daughter meal preps. Self feeds.  Using the Toilet? N  In the past six months, have you accidently leaked urine? N  Do you have problems with loss of bowel control? N  Managing your Medications? Y  Managing your Finances? Y  Housekeeping or managing your Housekeeping? Y  Some recent data might be hidden    Patient Care Team: Crecencio Mc, MD as PCP - General (Internal Medicine) Crecencio Mc, MD (Internal Medicine) Bary Castilla Forest Gleason, MD (General Surgery) Corey Skains, MD as Consulting Physician (Cardiology)    Assessment:    This is a routine wellness examination for [facility].  I connected with Hollie Beach and daughter [name] (HIPAA compliant) today by telephone and verified that I am speaking with the correct person using two identifiers. Location patient: home Location provider: work Persons participating in the virtual visit: patient, provider.   I discussed the limitations, risks, security and privacy concerns of performing an evaluation and management service by telephone and the availability of in person appointments. I also discussed with the patient that there may be a patient responsible charge related to this service. The patient expressed understanding and verbally consented to this telephonic visit.    Interactive audio and video telecommunications were attempted between this provider and patient, however failed, due to patient having technical difficulties OR patient did not have access to video capability.  We continued and completed visit with audio only.  Some vital signs may be absent or patient reported.   Time Spent with patient on telephone encounter: 20 minutes  Health Maintenance Due: -Foot Exam- denies any wounds or changes. Followed by pcp. -Hgb A1c-09/06/19 (7.0) -Covid vaccine- declined See completed HM at the end of note.   Eye: Visual acuity not assessed. Virtual visit. Followed by their ophthalmologist.  Retinopathy- none reported.  Dental: Dentures- yes  Hearing: Demonstrates normal hearing during visit.  Safety:  Patient feels safe at home- yes. Lives with daughter.  Patient does have smoke detectors at home- yes Patient does wear sunscreen or protective clothing when in direct sunlight - yes Patient does wear seat belt when in a moving vehicle - yes Patient drives- no Adequate lighting in walkways free from debris- yes Grab bars and handrails used as appropriate- yes Ambulates with an assistive device- yes; walker Is the patient's home free of loose throw rugs in  walkways, pet beds, electrical cords, etc?   Yes Adequate lighting?  Yes  Social: Alcohol intake - no   Smoking history- former  Smokers in home? none Illicit drug use? none  Medication: Taking as directed and without issues.  Daughter manages - yes   Covid-19: Precautions and sickness symptoms discussed. Wears mask, social distancing, hand hygiene as appropriate.   Activities of Daily Living Patient denies needing assistance with: feeding themselves, getting from bed to chair, getting to the toilet, bathing/showering, dressing.  Assisted by daughter with managing money, household chores, preparing meals as needed.   Discussed the importance of a healthy diet, water intake and the benefits of aerobic exercise.   Physical activity- walks a great deal in the home, no routine   Diet:  Healthy diet Water: good intake  Other Providers Patient Care Team: Crecencio Mc, MD as PCP - General (Internal Medicine) Crecencio Mc, MD (Internal Medicine) Bary Castilla Forest Gleason, MD (General Surgery) Corey Skains, MD as Consulting Physician (Cardiology)  Exercise Activities and Dietary recommendations Current Exercise Habits: The patient does not participate in regular exercise at present  Goals    . Follow up with Primary Care Provider     As needed       Fall Risk Fall Risk  11/08/2019 09/06/2019 06/19/2019 04/03/2019 08/02/2018  Falls in the past year? (No Data) 1 1 1  0  Comment No falls in last 3 months, since last reported. - - - -  Number falls in past yr: - 1 0 1 0  Injury with Fall? - 1 0 1 -  Risk for fall due to : - - - History of fall(s) -  Follow up Falls evaluation completed Falls evaluation completed - Falls evaluation completed Falls evaluation completed   Timed Get Up and Go performed: no, virtual visit  Depression Screen PHQ 2/9 Scores 11/08/2019 03/02/2018 10/30/2015 08/25/2014  PHQ - 2 Score 0 0 3 0  PHQ- 9 Score - - 7 -    Cognitive Function Patient is  alert and oriented x3. Patient denies difficulty focusing or concentrating. Patient likes to read.   MMSE - Mini Mental State Exam 11/08/2019  Not completed: Unable to complete        Immunization History  Administered Date(s) Administered  . Influenza Split 04/07/2012, 03/07/2018  . Influenza,inj,Quad PF,6+ Mos 03/08/2013, 03/07/2014, 03/13/2015, 04/03/2019  . Pneumococcal Conjugate-13 08/31/2013  . Pneumococcal Polysaccharide-23 03/09/1995, 08/22/2014  . Tdap 04/07/2012   Screening Tests Health Maintenance  Topic Date Due  . COVID-19 Vaccine (1) Never done  . FOOT EXAM  04/14/2018  . OPHTHALMOLOGY EXAM  12/26/2019  . INFLUENZA VACCINE  01/14/2020  . HEMOGLOBIN A1C  03/08/2020  . TETANUS/TDAP  04/07/2022  . DEXA SCAN  Completed  . PNA vac Low Risk Adult  Completed     Plan:   Keep all routine maintenance appointments.   Medicare Attestation I have personally reviewed: The patient's medical and social history Their use of alcohol, tobacco or illicit drugs Their current medications and supplements The patient's functional ability including ADLs,fall risks, home safety risks, cognitive, and hearing and visual impairment Diet and physical activities Evidence for depression   I have reviewed and discussed with patient certain preventive protocols, quality metrics, and best practice recommendations.      OBrien-Blaney, Trevelle Mcgurn L, LPN  [id number]   I have reviewed the above information and agree with above.   Deborra Medina, MD

## 2019-11-19 ENCOUNTER — Other Ambulatory Visit: Payer: Self-pay

## 2019-11-19 ENCOUNTER — Ambulatory Visit (INDEPENDENT_AMBULATORY_CARE_PROVIDER_SITE_OTHER): Payer: Medicare Other

## 2019-11-19 ENCOUNTER — Ambulatory Visit
Admission: EM | Admit: 2019-11-19 | Discharge: 2019-11-19 | Disposition: A | Discharge status: Home / Self Care | Payer: Medicare Other | Attending: Family Medicine | Admitting: Family Medicine

## 2019-11-19 DIAGNOSIS — R05 Cough: Secondary | ICD-10-CM | POA: Diagnosis not present

## 2019-11-19 DIAGNOSIS — Z803 Family history of malignant neoplasm of breast: Secondary | ICD-10-CM | POA: Diagnosis not present

## 2019-11-19 DIAGNOSIS — I110 Hypertensive heart disease with heart failure: Secondary | ICD-10-CM | POA: Insufficient documentation

## 2019-11-19 DIAGNOSIS — Z79899 Other long term (current) drug therapy: Secondary | ICD-10-CM | POA: Insufficient documentation

## 2019-11-19 DIAGNOSIS — Z950 Presence of cardiac pacemaker: Secondary | ICD-10-CM | POA: Diagnosis not present

## 2019-11-19 DIAGNOSIS — Z885 Allergy status to narcotic agent status: Secondary | ICD-10-CM | POA: Insufficient documentation

## 2019-11-19 DIAGNOSIS — U071 COVID-19: Secondary | ICD-10-CM | POA: Diagnosis not present

## 2019-11-19 DIAGNOSIS — K219 Gastro-esophageal reflux disease without esophagitis: Secondary | ICD-10-CM | POA: Insufficient documentation

## 2019-11-19 DIAGNOSIS — Z853 Personal history of malignant neoplasm of breast: Secondary | ICD-10-CM | POA: Diagnosis not present

## 2019-11-19 DIAGNOSIS — Z888 Allergy status to other drugs, medicaments and biological substances status: Secondary | ICD-10-CM | POA: Diagnosis not present

## 2019-11-19 DIAGNOSIS — Z880 Allergy status to penicillin: Secondary | ICD-10-CM | POA: Diagnosis not present

## 2019-11-19 DIAGNOSIS — Z7901 Long term (current) use of anticoagulants: Secondary | ICD-10-CM | POA: Diagnosis not present

## 2019-11-19 DIAGNOSIS — J069 Acute upper respiratory infection, unspecified: Secondary | ICD-10-CM | POA: Diagnosis not present

## 2019-11-19 DIAGNOSIS — I509 Heart failure, unspecified: Secondary | ICD-10-CM | POA: Diagnosis not present

## 2019-11-19 DIAGNOSIS — E119 Type 2 diabetes mellitus without complications: Secondary | ICD-10-CM | POA: Diagnosis not present

## 2019-11-19 DIAGNOSIS — E782 Mixed hyperlipidemia: Secondary | ICD-10-CM | POA: Insufficient documentation

## 2019-11-19 DIAGNOSIS — I700 Atherosclerosis of aorta: Secondary | ICD-10-CM | POA: Diagnosis not present

## 2019-11-19 DIAGNOSIS — I2510 Atherosclerotic heart disease of native coronary artery without angina pectoris: Secondary | ICD-10-CM | POA: Insufficient documentation

## 2019-11-19 NOTE — ED Provider Notes (Signed)
[facility]    CSN: [id number] Arrival date & time: 11/19/19  1138      History   Chief Complaint Chief Complaint  Patient presents with   Cough    HPI Houston Surges is a 86 y.o. female.   86 yo female accompanied by daughter with a c/o runny nose, nasal congestion, cough, sweats for the past 3-4 days. Denies any fevers, shortness of breath, wheezing or chest pains. Recent exposure to covid through grandson who tested positive this past week. Patient has not been vaccinated.      Past Medical History:  Diagnosis Date   Atrial fibrillation ([place]) 2012   Breast cancer ([place]) 04/2018   IDC of left breast    CHF (congestive heart failure) ([place])    Colon polyp 2003   Coronary artery disease    Diabetes mellitus without complication ([facility])    Dyspnea    Dysrhythmia    atrial fib   Environmental and seasonal allergies    GERD (gastroesophageal reflux disease)    Hyperlipidemia    Hypertension    Irritable bowel syndrome    Multiple rib fractures 03/15/2018   Polyp of colon    PONV (postoperative nausea and vomiting)    with ether, not recently "woke up in surgery once"   Presence of permanent cardiac pacemaker    Tremor    Tremors of nervous system    Ulcer     Patient Active Problem List   Diagnosis Date Noted   Closed rib fracture 09/08/2019   S/P placement of cardiac pacemaker 09/08/2019   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 09/08/2019   Pruritus 09/08/2019   Invasive ductal carcinoma of breast, female, left ([place]) 04/26/2018   Bilateral carotid artery stenosis 04/20/2018   Atherosclerosis of aorta ([place]) 03/15/2018   Balance problem 03/15/2018   Personal history of fall, presenting hazards to health 03/15/2018   Mass of upper inner quadrant of left breast 03/14/2018   Fatigue 10/27/2017   Intention tremor 04/15/2017   Osteoporosis 03/21/2016   Sick sinus syndrome ([place]) 01/28/2016   Other malaise  11/01/2015   Major depressive disorder, recurrent episode ([place]) 10/30/2015   Constipation 07/27/2015   Generalized anxiety disorder 04/02/2015   Ischemic chest pain ([place]) 01/08/2015   Chronic atrial fibrillation ([place]) 08/16/2014   Medicare annual wellness visit, subsequent 05/23/2014   Hyperlipidemia, mixed 05/08/2014   Moderate mitral insufficiency 05/08/2014   Moderate tricuspid insufficiency 05/08/2014   Dry mouth 03/10/2014   Low serum vitamin D 02/16/2014   LVH (left ventricular hypertrophy) 11/23/2013   Anal polyp 09/20/2013   Personal history of colonic polyps 08/31/2013   CAD (coronary artery disease) 03/09/2013   Diabetes mellitus without complication ([place]) 92/33/0076   Hyperlipidemia associated with type 2 diabetes mellitus ([place]) 03/09/2013   Essential hypertension, benign 03/09/2013   Nonulcer dyspepsia 03/09/2013    Past Surgical History:  Procedure Laterality Date   ABDOMINAL ADHESION SURGERY     ABDOMINAL HYSTERECTOMY     APPENDECTOMY     BREAST BIOPSY Right late 80s/early 90s   benign   BREAST BIOPSY Left 03/31/2018   [place] bx done with Dr. Bary Castilla, invasive ductal carcinoma   BREAST LUMPECTOMY Left 04/22/2018   Procedure: BREAST LUMPECTOMY;  Surgeon: Robert Bellow, MD;  Location: [facility] ORS;  Service: General;  Laterality: Left;   BREAST LUMPECTOMY Left 04/22/2018   [facility], negative LN, clear margins   BREAST SURGERY Right    biopsy    CESAREAN SECTION  x 2   CHOLECYSTECTOMY  03-2012   Dr Burt Knack   COLONOSCOPY  2003   Dr Theodosia Paling in [place]   fibroid tumor     [place] N/A 01/28/2016   Procedure: INSERTION PACEMAKER;  Surgeon: Isaias Cowman, MD;  Location: [facility] ORS;  Service: Cardiovascular;  Laterality: N/A;   SENTINEL NODE BIOPSY Left 04/22/2018   Procedure: SENTINEL NODE BIOPSY;  Surgeon: Robert Bellow, MD;  Location: [facility] ORS;  Service: General;  Laterality: Left;   UPPER GI ENDOSCOPY  2003   Dr  Theodosia Paling in [place]    OB History    Gravida  2   Para  2   Term      Preterm      AB      Living        SAB      TAB      Ectopic      Multiple      Live Births           Obstetric Comments  1st Menstrual Cycle:  16 1st Pregnancy:  30         Home Medications    Prior to Admission medications   Medication Sig Start Date End Date Taking? Authorizing Provider  amLODipine (NORVASC) 5 MG tablet TAKE ONE TABLET (5 mg) BY MOUTH every morning 10/12/18  Yes Crecencio Mc, MD  atenolol (TENORMIN) 50 MG tablet Take 1 tablet by mouth twice daily 07/10/19  Yes Crecencio Mc, MD  Cholecalciferol (VITAMIN D3) 1000 UNITS CAPS Take 1,000 Units by mouth every morning.    Yes [provider]  glucose blood (ONE TOUCH ULTRA TEST) test strip USE 1 STRIP TO CHECK GLUCOSE TWICE DAILY  Dx code: E11.9 05/29/19  Yes Crecencio Mc, MD  glucose blood (ONE TOUCH ULTRA TEST) test strip USE ONE STRIP TO CHECK GLUCOSE ONCE  DAILY 06/02/19  Yes Crecencio Mc, MD  hydrochlorothiazide (HYDRODIURIL) 25 MG tablet Take 25 mg by mouth daily.  12/12/15  Yes [provider]  isosorbide mononitrate (IMDUR) 60 MG 24 hr tablet Take 60 mg by mouth 2 (two) times daily. 12/27/15  Yes [provider]  Lancets (ONETOUCH DELICA PLUS [id number]) [place] 2 Devices into the skin daily. DX Code E11.9. 01/30/19  Yes Crecencio Mc, MD  lisinopril (ZESTRIL) 20 MG tablet Take 2 tablets by mouth twice daily 10/27/19  Yes Crecencio Mc, MD  meclizine (ANTIVERT) 25 MG tablet Take 1 tablet by mouth three times daily as needed 06/02/19  Yes Crecencio Mc, MD  nitroGLYCERIN (NITROSTAT) 0.4 MG SL tablet Place 0.4 mg under the tongue every 5 (five) minutes as needed for chest pain.   Yes [provider]  omeprazole (PRILOSEC) 20 MG capsule Take 1 capsule by mouth once daily 05/31/19  Yes Crecencio Mc, MD  [facility] ULTRA test strip USE 1 STRIP TO CHECK GLUCOSE TWICE DAILY  05/15/19  Yes Crecencio Mc, MD  Polyethyl Glycol-Propyl Glycol 0.4-0.3 % SOLN Place 1 drop into both eyes 3 (three) times daily as needed (for dry/irritated eyes.).   Yes [provider]  triamcinolone cream (KENALOG) 0.1 % Apply 1 application topically 2 (two) times daily. 09/06/19  Yes Crecencio Mc, MD  warfarin (COUMADIN) 4 MG tablet Take 4 mg by mouth daily at 6 PM.   Yes [provider]  albuterol (PROAIR HFA) 108 (90 Base) MCG/ACT inhaler Inhale 1-2 puffs into the lungs every 4 (four)  hours as needed for wheezing or shortness of breath. 11/17/17   Laverle Hobby, MD  psyllium (METAMUCIL) 58.6 % packet Take 1 packet by mouth daily as needed (for regularity).     [provider]    Family History Family History  Problem Relation Age of Onset   Cancer Sister 46       breast   Breast cancer Maternal Aunt 68   Breast cancer Cousin    Breast cancer Other 53    Social History Social History   Tobacco Use   Smoking status: Never Smoker   Smokeless tobacco: Former Systems developer    Types: Snuff   Tobacco comment: occasionally  Substance Use Topics   Alcohol use: No   Drug use: No     Allergies   Codeine, Penicillins, Amlodipine, Atorvastatin, Clonidine derivatives, Gabapentin, Losartan, Morphine, Morphine and related, Reglan [metoclopramide], and Hydralazine   Review of Systems Review of Systems   Physical Exam Triage Vital Signs ED Triage Vitals  Enc Vitals Group     BP 11/19/19 1152 (!) 153/86     Pulse Rate 11/19/19 1152 (!) 59     Resp 11/19/19 1152 18     Temp 11/19/19 1152 98 F (36.7 C)     Temp Source 11/19/19 1152 Oral     SpO2 11/19/19 1152 99 %     Weight 11/19/19 1150 130 lb 15.3 oz (59.4 kg)     Height 11/19/19 1150 5\' 6"  (1.676 m)     Head Circumference --      Peak Flow --      Pain Score 11/19/19 1149 8     Pain Loc --      Pain Edu? --      Excl. in [place]? --    No data found.  Updated Vital Signs BP (!)  153/86 (BP Location: Left Arm)    Pulse (!) 59    Temp 98 F (36.7 C) (Oral)    Resp 18    Ht 5\' 6"  (1.676 m)    Wt 59.4 kg    SpO2 99%    BMI 21.14 kg/m   Visual Acuity Right Eye Distance:   Left Eye Distance:   Bilateral Distance:    Right Eye Near:   Left Eye Near:    Bilateral Near:     Physical Exam Vitals and nursing note reviewed.  Constitutional:      General: She is not in acute distress.    Appearance: She is not toxic-appearing or diaphoretic.  HENT:     Right Ear: Tympanic membrane normal.     Left Ear: Tympanic membrane normal.     Nose: Congestion and rhinorrhea present.     Mouth/Throat:     Pharynx: Posterior oropharyngeal erythema present. No oropharyngeal exudate.  Cardiovascular:     Rate and Rhythm: Normal rate.     Heart sounds: Normal heart sounds.  Pulmonary:     Effort: Pulmonary effort is normal. No respiratory distress.     Breath sounds: Normal breath sounds. No stridor. No wheezing, rhonchi or rales.  Musculoskeletal:     Cervical back: Neck supple.  Neurological:     Mental Status: She is alert.      UC Treatments / Results  Labs (all labs ordered are listed, but only abnormal results are displayed) Labs Reviewed  SARS CORONAVIRUS 2 (TAT 6-24 HRS)    EKG   Radiology DG Chest 2 View  Result Date: 11/19/2019 CLINICAL DATA:  Patient  complains of chest congestion, cough, nasal congestion, body aches, chills and sweats. Patient daughter reports that grandson who was living in her home was positive for Covid. Patient has not had the vaccines. Hx left breast ca, CHF, pacemaker EXAM: CHEST - 2 VIEW COMPARISON:  07/16/2019 FINDINGS: Pulmonary hyperinflation with coarse attenuated perihilar bronchovascular markings as before. No focal infiltrate or overt edema. Heart size and mediastinal contours are within normal limits. Aortic Atherosclerosis (ICD10-170.0). Stable left subclavian pacemaker lead extending towards the right ventricular apex. No  effusion.  No pneumothorax. Visualized bones unremarkable. IMPRESSION: Pulmonary hyperinflation. No acute disease. Electronically Signed   By: Lucrezia Europe M.D.   On: 11/19/2019 12:41    Procedures Procedures (including critical care time)  Medications Ordered in UC Medications - No data to display  Initial Impression / Assessment and Plan / UC Course  I have reviewed the triage vital signs and the nursing notes.  Pertinent labs & imaging results that were available during my care of the patient were reviewed by me and considered in my medical decision making (see chart for details).      Final Clinical Impressions(s) / UC Diagnoses   Final diagnoses:  Viral URI with cough     Discharge Instructions     Symptom treatment with over the counter medication as needed Fluids/hydrate Await covid test    ED Prescriptions    None      1. Chest x-ray results and diagnosis reviewed with patient and daughter 2. Await covid test 3. Recommend supportive treatment with otc medications prn 4. Follow-up prn if symptoms worsen or don't improve   PDMP not reviewed this encounter.   Norval Gable, MD 11/19/19 1312

## 2019-11-19 NOTE — ED Triage Notes (Signed)
Patient complains of chest congestion, cough, nasal congestion, body aches, chills and sweats. Patient daughter reports that grandson who was living in her home was positive for Covid. Patient has not had the vaccines.

## 2019-11-19 NOTE — Discharge Instructions (Signed)
Symptom treatment with over the counter medication as needed Fluids/hydrate Await covid test

## 2019-11-20 LAB — SARS CORONAVIRUS 2 (TAT 6-24 HRS): SARS Coronavirus 2: POSITIVE — AB

## 2019-11-21 ENCOUNTER — Telehealth: Payer: Self-pay | Admitting: Unknown Physician Specialty

## 2019-11-21 NOTE — Telephone Encounter (Signed)
Called to discuss with patient about Covid symptoms and the use of bamlanivimab, a monoclonal antibody infusion for those with mild to moderate Covid symptoms and at a high risk of hospitalization.  Pt is qualified for this infusion at the [facility] infusion center due to age and other comorbid conditions.    Message left with daughter.  Will also send mychart

## 2019-11-22 MED ORDER — AMLODIPINE BESYLATE 5 MG PO TABS: ORAL_TABLET | ORAL | 3 refills | Status: DC

## 2019-12-10 ENCOUNTER — Other Ambulatory Visit: Payer: Self-pay | Admitting: Internal Medicine

## 2019-12-25 ENCOUNTER — Other Ambulatory Visit: Payer: Self-pay

## 2019-12-25 ENCOUNTER — Ambulatory Visit (INDEPENDENT_AMBULATORY_CARE_PROVIDER_SITE_OTHER): Payer: Medicare Other | Admitting: Internal Medicine

## 2019-12-25 ENCOUNTER — Encounter: Payer: Self-pay | Admitting: Internal Medicine

## 2019-12-25 VITALS — BP 132/68 | HR 83 | Temp 97.8°F | Resp 16 | Ht 66.0 in | Wt 128.0 lb

## 2019-12-25 DIAGNOSIS — E1169 Type 2 diabetes mellitus with other specified complication: Secondary | ICD-10-CM | POA: Diagnosis not present

## 2019-12-25 DIAGNOSIS — E1142 Type 2 diabetes mellitus with diabetic polyneuropathy: Secondary | ICD-10-CM

## 2019-12-25 DIAGNOSIS — D6869 Other thrombophilia: Secondary | ICD-10-CM

## 2019-12-25 DIAGNOSIS — Z8616 Personal history of COVID-19: Secondary | ICD-10-CM

## 2019-12-25 DIAGNOSIS — U071 COVID-19: Secondary | ICD-10-CM

## 2019-12-25 DIAGNOSIS — R2689 Other abnormalities of gait and mobility: Secondary | ICD-10-CM | POA: Diagnosis not present

## 2019-12-25 DIAGNOSIS — E119 Type 2 diabetes mellitus without complications: Secondary | ICD-10-CM | POA: Diagnosis not present

## 2019-12-25 DIAGNOSIS — R634 Abnormal weight loss: Secondary | ICD-10-CM | POA: Insufficient documentation

## 2019-12-25 DIAGNOSIS — E785 Hyperlipidemia, unspecified: Secondary | ICD-10-CM

## 2019-12-25 HISTORY — DX: COVID-19: U07.1

## 2019-12-25 NOTE — Assessment & Plan Note (Signed)
Managed finally without glipizide .  BS have been controlled

## 2019-12-25 NOTE — Patient Instructions (Signed)
I AM SO GLAD YOU RECOVERED SO EASILY!  EAT WHATEVER YOU WANT.  I WANT YOU TO REGAIN YOUR WEIGHT   HOME PHYSICAL THERAPY IS STILL AN OPTION FOR YOUR BALANCE ISSUES

## 2019-12-25 NOTE — Assessment & Plan Note (Addendum)
Chronic . Aggravated by neuropathy.  No recent falls.   Home PT offered but deferred

## 2019-12-25 NOTE — Assessment & Plan Note (Signed)
SHe recovered uneventfully at home. .  She has not received the vaccination

## 2019-12-25 NOTE — Assessment & Plan Note (Signed)
Her embolic stroke risk is managed with use of coumadin

## 2019-12-25 NOTE — Assessment & Plan Note (Signed)
Secondary to recent COVID 19 infection.  weight has improved

## 2019-12-25 NOTE — Progress Notes (Signed)
Subjective:  Patient ID: Leah Holmes, female    DOB: Nov 16, 1926  Age: 86 y.o. MRN: [id number]  CC: The primary encounter diagnosis was Hyperlipidemia associated with type 2 diabetes mellitus ([place]). Diagnoses of Diabetes mellitus without complication ([place]), Personal history of covid-19, Abnormal weight loss, DM type 2 with diabetic peripheral neuropathy ([place]), Acquired thrombophilia ([place]), COVID-19 virus infection, Balance problem, and Weight loss, unintentional were also pertinent to this visit.  HPI Leah Holmes presents for follow up on RECENT COVID 29 INFECTION.   This visit occurred during the SARS-CoV-2 public health emergency.  Safety protocols were in place, including screening questions prior to the visit, additional usage of staff PPE, and extensive cleaning of exam room while observing appropriate contact time as indicated for disinfecting solutions.    Tested positive for COVID ON June 6 during ER visit for URI symptoms which started after being notified that her visiting grandson had become infected by a fried during a friendlgy game of gold.  Grandson and patient had not been vaccinated.  Grandson had been stationed in [place] with the [employer]  Patient Recovered uneventfully at home,  Lost sense of taste and smell for a few weeks.  Still some clear rhinitis but not constant, mostly in the morning but generally feels fine.  No dyspnea   Diabetic neuropathy: Feet feel numb and are cold at night,  Not keeping her up  Balance is poor but she declines home PT in favor of continuining her own regimen of exercise.  Has had one fall in the past year but none since last visit.   Wt loss 6% drop since October (9 months).  Lost more per daughter due to poor appetite , anosmia/dysgeusia,  but has gained back 7 lbs since her senses have returned.    Outpatient Medications Prior to Visit  Medication Sig Dispense Refill  . albuterol (PROAIR HFA) 108 (90 Base) MCG/ACT  inhaler Inhale 1-2 puffs into the lungs every 4 (four) hours as needed for wheezing or shortness of breath. 1 Inhaler 5  . amLODipine (NORVASC) 5 MG tablet TAKE ONE TABLET (5 mg) BY MOUTH every morning 90 tablet 3  . atenolol (TENORMIN) 50 MG tablet Take 1 tablet by mouth twice daily 180 tablet 1  . Cholecalciferol (VITAMIN D3) 1000 UNITS CAPS Take 1,000 Units by mouth every morning.     Marland Kitchen glucose blood (ONE TOUCH ULTRA TEST) test strip USE 1 STRIP TO CHECK GLUCOSE TWICE DAILY  Dx code: E11.9 200 each 3  . glucose blood (ONE TOUCH ULTRA TEST) test strip USE ONE STRIP TO CHECK GLUCOSE ONCE  DAILY 100 each 3  . hydrochlorothiazide (HYDRODIURIL) 25 MG tablet Take 25 mg by mouth daily.     . isosorbide mononitrate (IMDUR) 60 MG 24 hr tablet Take 60 mg by mouth 2 (two) times daily.    . Lancets (ONETOUCH DELICA PLUS [id number]) MISC Inject 2 Devices into the skin daily. DX Code E11.9. 200 each 2  . lisinopril (ZESTRIL) 20 MG tablet Take 2 tablets by mouth twice daily 180 tablet 0  . meclizine (ANTIVERT) 25 MG tablet Take 1 tablet by mouth three times daily as needed 90 tablet 0  . nitroGLYCERIN (NITROSTAT) 0.4 MG SL tablet Place 0.4 mg under the tongue every 5 (five) minutes as needed for chest pain.    Marland Kitchen omeprazole (PRILOSEC) 20 MG capsule Take 1 capsule by mouth once daily 90 capsule 1  . ONETOUCH ULTRA test strip USE 1 STRIP  TO CHECK GLUCOSE TWICE DAILY 100 each 0  . Polyethyl Glycol-Propyl Glycol 0.4-0.3 % SOLN Place 1 drop into both eyes 3 (three) times daily as needed (for dry/irritated eyes.).    Marland Kitchen psyllium (METAMUCIL) 58.6 % packet Take 1 packet by mouth daily as needed (for regularity).     . triamcinolone cream (KENALOG) 0.1 % Apply 1 application topically 2 (two) times daily. 30 g 0  . warfarin (COUMADIN) 4 MG tablet Take 4 mg by mouth daily at 6 PM.     No facility-administered medications prior to visit.    Review of Systems;  Patient denies headache, fevers, malaise, unintentional  weight loss, skin rash, eye pain, sinus congestion and sinus pain, sore throat, dysphagia,  hemoptysis , cough, dyspnea, wheezing, chest pain, palpitations, orthopnea, edema, abdominal pain, nausea, melena, diarrhea, constipation, flank pain, dysuria, hematuria, urinary  Frequency, nocturia, numbness, tingling, seizures,  Focal weakness, Loss of consciousness,  Tremor, insomnia, depression, anxiety, and suicidal ideation.      Objective:  BP 132/68 (BP Location: Left Arm, Patient Position: Sitting, Cuff Size: Normal)   Pulse 83   Temp 97.8 F (36.6 C) (Oral)   Resp 16   Ht 5\' 6"  (1.676 m)   Wt 128 lb (58.1 kg)   SpO2 97%   BMI 20.66 kg/m   BP Readings from Last 3 Encounters:  12/25/19 132/68  11/19/19 (!) 153/86  09/06/19 (!) 180/82    Wt Readings from Last 3 Encounters:  12/25/19 128 lb (58.1 kg)  11/19/19 130 lb 15.3 oz (59.4 kg)  11/08/19 131 lb (59.4 kg)    General appearance: alert, cooperative and appears stated age Ears: normal TM's and external ear canals both ears Throat: lips, mucosa, and tongue normal; teeth and gums normal Neck: no adenopathy, no carotid bruit, supple, symmetrical, trachea midline and thyroid not enlarged, symmetric, no tenderness/mass/nodules Back: symmetric, no curvature. ROM normal. No CVA tenderness. Lungs: clear to auscultation bilaterally Heart: regular rate and rhythm, S1, S2 normal, no murmur, click, rub or gallop Abdomen: soft, non-tender; bowel sounds normal; no masses,  no organomegaly Pulses: 2+ and symmetric Skin: Skin color, texture, turgor normal. No rashes or lesions Lymph nodes: Cervical, supraclavicular, and axillary nodes normal.  Lab Results  Component Value Date   HGBA1C 7.0 (H) 09/06/2019   HGBA1C 6.4 04/03/2019   HGBA1C 6.2 07/22/2018    Lab Results  Component Value Date   CREATININE 0.72 09/06/2019   CREATININE 0.66 04/03/2019   CREATININE 0.51 12/12/2018    Lab Results  Component Value Date   WBC 7.2  09/06/2019   HGB 14.2 09/06/2019   HCT 41.9 09/06/2019   PLT 205.0 09/06/2019   GLUCOSE 131 (H) 09/06/2019   CHOL 204 (H) 10/30/2015   TRIG 94.0 10/30/2015   HDL 57.80 10/30/2015   LDLDIRECT 116.0 10/30/2015   LDLCALC 128 (H) 10/30/2015   ALT 16 09/06/2019   AST 21 09/06/2019   NA 131 (L) 09/06/2019   K 3.9 09/06/2019   CL 96 09/06/2019   CREATININE 0.72 09/06/2019   BUN 12 09/06/2019   CO2 31 09/06/2019   TSH 0.80 04/03/2019   INR 2.1 (H) 12/12/2018   HGBA1C 7.0 (H) 09/06/2019   MICROALBUR <0.7 04/14/2017    DG Chest 2 View  Result Date: 11/19/2019 CLINICAL DATA:  Patient complains of chest congestion, cough, nasal congestion, body aches, chills and sweats. Patient daughter reports that grandson who was living in her home was positive for Covid. Patient has not had  the vaccines. Hx left breast ca, CHF, pacemaker EXAM: CHEST - 2 VIEW COMPARISON:  07/16/2019 FINDINGS: Pulmonary hyperinflation with coarse attenuated perihilar bronchovascular markings as before. No focal infiltrate or overt edema. Heart size and mediastinal contours are within normal limits. Aortic Atherosclerosis (ICD10-170.0). Stable left subclavian pacemaker lead extending towards the right ventricular apex. No effusion.  No pneumothorax. Visualized bones unremarkable. IMPRESSION: Pulmonary hyperinflation. No acute disease. Electronically Signed   By: Lucrezia Europe M.D.   On: 11/19/2019 12:41    Assessment & Plan:   Problem List Items Addressed This Visit      Unprioritized   DM type 2 with diabetic peripheral neuropathy ([place])    Managed finally without glipizide .  BS have been controlled       Hyperlipidemia associated with type 2 diabetes mellitus ([place]) - Primary   Balance problem    Chronic . Aggravated by neuropathy.  No recent falls.   Home PT offered but deferred       Acquired thrombophilia ([place])    Her embolic stroke risk is managed with use of coumadin      COVID-19 virus infection    SHe  recovered uneventfully at home. .  She has not received the vaccination       Weight loss, unintentional    Secondary to recent COVID 19 infection.  weight has improved        Other Visit Diagnoses    Personal history of covid-19       Relevant Orders   SARS-CoV-2 Semi-Quantitative Total Antibody, Spike   Abnormal weight loss       Relevant Orders   TSH      I am having Verdean L. Lanni maintain her nitroGLYCERIN, psyllium, Vitamin D3, isosorbide mononitrate, hydrochlorothiazide, albuterol, Polyethyl Glycol-Propyl Glycol, warfarin, OneTouch Delica Plus [id number], OneTouch Ultra, glucose blood, omeprazole, meclizine, glucose blood, atenolol, triamcinolone cream, amLODipine, and lisinopril.  No orders of the defined types were placed in this encounter.   There are no discontinued medications.  Follow-up: No follow-ups on file.   Crecencio Mc, MD

## 2020-01-03 DIAGNOSIS — I480 Paroxysmal atrial fibrillation: Secondary | ICD-10-CM | POA: Diagnosis not present

## 2020-01-08 ENCOUNTER — Other Ambulatory Visit: Payer: Self-pay | Admitting: Internal Medicine

## 2020-01-23 ENCOUNTER — Other Ambulatory Visit: Payer: Self-pay | Admitting: Internal Medicine

## 2020-02-05 DIAGNOSIS — I480 Paroxysmal atrial fibrillation: Secondary | ICD-10-CM | POA: Diagnosis not present

## 2020-02-23 ENCOUNTER — Encounter: Payer: Self-pay | Admitting: Internal Medicine

## 2020-02-23 ENCOUNTER — Ambulatory Visit (INDEPENDENT_AMBULATORY_CARE_PROVIDER_SITE_OTHER): Payer: Medicare Other | Admitting: Internal Medicine

## 2020-02-23 ENCOUNTER — Other Ambulatory Visit: Payer: Self-pay

## 2020-02-23 ENCOUNTER — Ambulatory Visit (INDEPENDENT_AMBULATORY_CARE_PROVIDER_SITE_OTHER): Payer: Medicare Other

## 2020-02-23 VITALS — BP 176/54 | HR 69 | Temp 98.7°F | Resp 15 | Ht 66.0 in | Wt 131.0 lb

## 2020-02-23 DIAGNOSIS — L299 Pruritus, unspecified: Secondary | ICD-10-CM

## 2020-02-23 DIAGNOSIS — C50912 Malignant neoplasm of unspecified site of left female breast: Secondary | ICD-10-CM

## 2020-02-23 DIAGNOSIS — R0602 Shortness of breath: Secondary | ICD-10-CM | POA: Diagnosis not present

## 2020-02-23 DIAGNOSIS — R634 Abnormal weight loss: Secondary | ICD-10-CM | POA: Diagnosis not present

## 2020-02-23 DIAGNOSIS — D6869 Other thrombophilia: Secondary | ICD-10-CM | POA: Diagnosis not present

## 2020-02-23 DIAGNOSIS — Z8616 Personal history of COVID-19: Secondary | ICD-10-CM

## 2020-02-23 DIAGNOSIS — E1142 Type 2 diabetes mellitus with diabetic polyneuropathy: Secondary | ICD-10-CM

## 2020-02-23 DIAGNOSIS — J90 Pleural effusion, not elsewhere classified: Secondary | ICD-10-CM | POA: Diagnosis not present

## 2020-02-23 DIAGNOSIS — Z23 Encounter for immunization: Secondary | ICD-10-CM | POA: Diagnosis not present

## 2020-02-23 DIAGNOSIS — E119 Type 2 diabetes mellitus without complications: Secondary | ICD-10-CM

## 2020-02-23 DIAGNOSIS — I10 Essential (primary) hypertension: Secondary | ICD-10-CM

## 2020-02-23 LAB — COMPREHENSIVE METABOLIC PANEL
ALT: 18 U/L (ref 0–35)
AST: 21 U/L (ref 0–37)
Albumin: 3.9 g/dL (ref 3.5–5.2)
Alkaline Phosphatase: 66 U/L (ref 39–117)
BUN: 11 mg/dL (ref 6–23)
CO2: 32 mEq/L (ref 19–32)
Calcium: 9.5 mg/dL (ref 8.4–10.5)
Chloride: 99 mEq/L (ref 96–112)
Creatinine, Ser: 0.61 mg/dL (ref 0.40–1.20)
GFR: 91.84 mL/min (ref >60.00)
Glucose, Bld: 97 mg/dL (ref 70–99)
Potassium: 4 mEq/L (ref 3.5–5.1)
Sodium: 136 mEq/L (ref 135–145)
Total Bilirubin: 0.9 mg/dL (ref 0.2–1.2)
Total Protein: 6.6 g/dL (ref 6.0–8.3)

## 2020-02-23 LAB — CBC WITH DIFFERENTIAL/PLATELET
Basophils Absolute: 0 10*3/uL (ref 0.0–0.1)
Basophils Relative: 0.7 % (ref 0.0–3.0)
Eosinophils Absolute: 0.2 10*3/uL (ref 0.0–0.7)
Eosinophils Relative: 4.5 % (ref 0.0–5.0)
HCT: 42.7 % (ref 36.0–46.0)
Hemoglobin: 14.1 g/dL (ref 12.0–15.0)
Lymphocytes Relative: 18.2 % (ref 12.0–46.0)
Lymphs Abs: 1 10*3/uL (ref 0.7–4.0)
MCHC: 33.1 g/dL (ref 30.0–36.0)
MCV: 96 fl (ref 78.0–100.0)
Monocytes Absolute: 0.6 10*3/uL (ref 0.1–1.0)
Monocytes Relative: 10.6 % (ref 3.0–12.0)
Neutro Abs: 3.5 10*3/uL (ref 1.4–7.7)
Neutrophils Relative %: 66 % (ref 43.0–77.0)
Platelets: 202 10*3/uL (ref 150.0–400.0)
RBC: 4.45 Mil/uL (ref 3.87–5.11)
RDW: 13.7 % (ref 11.5–15.5)
WBC: 5.2 10*3/uL (ref 4.0–10.5)

## 2020-02-23 LAB — TSH: TSH: 0.59 u[IU]/mL (ref 0.35–4.50)

## 2020-02-23 LAB — MICROALBUMIN / CREATININE URINE RATIO
Creatinine,U: 80.5 mg/dL
Microalb Creat Ratio: 1.3 mg/g (ref 0.0–30.0)
Microalb, Ur: 1 mg/dL (ref 0.0–1.9)

## 2020-02-23 LAB — HEMOGLOBIN A1C: Hgb A1c MFr Bld: 6.8 % — ABNORMAL HIGH (ref 4.6–6.5)

## 2020-02-23 MED ORDER — IPRATROPIUM BROMIDE 0.03 % NA SOLN: 2.0000 | Freq: Two times a day (BID) | NASAL | 12 refills | Status: DC

## 2020-02-23 NOTE — Progress Notes (Signed)
Subjective:  Patient ID: Leah Holmes, female    DOB: 05-22-1927  Age: 86 y.o. MRN: [id number]  CC: The primary encounter diagnosis was Pruritus. Diagnoses of Short of breath on exertion, Abnormal weight loss, Diabetes mellitus without complication (HCC), Personal history of covid-19, Need for immunization against influenza, DM type 2 with diabetic peripheral neuropathy ([place]), Essential hypertension, benign, Acquired thrombophilia ([place]), Invasive ductal carcinoma of breast, female, left ([place]), and Weight loss, unintentional were also pertinent to this visit.  HPI [name] presents for follow up on chronic issues.  She was last  seen in July  Following recovery from COVID 19 infection (mild). She is accompanied by her daughter [name].   This visit occurred during the SARS-CoV-2 public health emergency.  Safety protocols were in place, including screening questions prior to the visit, additional usage of staff PPE, and extensive cleaning of exam room while observing appropriate contact time as indicated for disinfecting solutions.   She has been having elevated blood pressure readings even at home.  Stopped the amlodipine because  she thought it made her itch. However, the Itching has not stopped .  She is no longer taking  allegra  Has a persistent "Tickle" in throat,  Due to PND,  Not a lot of coughing  Tremor bothering her more  Outpatient Medications Prior to Visit  Medication Sig Dispense Refill  . albuterol (PROAIR HFA) 108 (90 Base) MCG/ACT inhaler Inhale 1-2 puffs into the lungs every 4 (four) hours as needed for wheezing or shortness of breath. 1 Inhaler 5  . amLODipine (NORVASC) 5 MG tablet TAKE ONE TABLET (5 mg) BY MOUTH every morning 90 tablet 3  . atenolol (TENORMIN) 50 MG tablet Take 1 tablet by mouth twice daily 180 tablet 0  . Cholecalciferol (VITAMIN D3) 1000 UNITS CAPS Take 1,000 Units by mouth every morning.     Marland Kitchen glucose blood (ONE TOUCH ULTRA TEST)  test strip USE 1 STRIP TO CHECK GLUCOSE TWICE DAILY  Dx code: E11.9 200 each 3  . glucose blood (ONE TOUCH ULTRA TEST) test strip USE ONE STRIP TO CHECK GLUCOSE ONCE  DAILY 100 each 3  . hydrochlorothiazide (HYDRODIURIL) 25 MG tablet Take 25 mg by mouth daily.     . isosorbide mononitrate (IMDUR) 60 MG 24 hr tablet Take 60 mg by mouth 2 (two) times daily.    . Lancets (ONETOUCH DELICA PLUS [id number]) MISC Inject 2 Devices into the skin daily. DX Code E11.9. 200 each 2  . lisinopril (ZESTRIL) 20 MG tablet Take 2 tablets by mouth twice daily 180 tablet 0  . meclizine (ANTIVERT) 25 MG tablet Take 1 tablet by mouth three times daily as needed 90 tablet 0  . nitroGLYCERIN (NITROSTAT) 0.4 MG SL tablet Place 0.4 mg under the tongue every 5 (five) minutes as needed for chest pain.    Marland Kitchen omeprazole (PRILOSEC) 20 MG capsule Take 1 capsule by mouth once daily 90 capsule 1  . ONETOUCH ULTRA test strip USE 1 STRIP TO CHECK GLUCOSE TWICE DAILY 100 each 0  . Polyethyl Glycol-Propyl Glycol 0.4-0.3 % SOLN Place 1 drop into both eyes 3 (three) times daily as needed (for dry/irritated eyes.).    Marland Kitchen psyllium (METAMUCIL) 58.6 % packet Take 1 packet by mouth daily as needed (for regularity).     . triamcinolone cream (KENALOG) 0.1 % Apply 1 application topically 2 (two) times daily. 30 g 0  . warfarin (COUMADIN) 2 MG tablet Take 4 mg by mouth at  bedtime.     Marland Kitchen warfarin (COUMADIN) 4 MG tablet Take 4 mg by mouth daily at 6 PM.      No facility-administered medications prior to visit.    Review of Systems;  Patient denies headache, fevers, malaise, unintentional weight loss, skin rash, eye pain, sinus congestion and sinus pain, sore throat, dysphagia,  hemoptysis , cough, dyspnea, wheezing, chest pain, palpitations, orthopnea, edema, abdominal pain, nausea, melena, diarrhea, constipation, flank pain, dysuria, hematuria, urinary  Frequency, nocturia, numbness, tingling, seizures,  Focal weakness, Loss of consciousness,   Tremor, insomnia, depression, anxiety, and suicidal ideation.      Objective:  BP (!) 176/54 (BP Location: Left Arm, Patient Position: Sitting, Cuff Size: Normal)   Pulse 69   Temp 98.7 F (37.1 C) (Oral)   Resp 15   Ht 5\' 6"  (1.676 m)   Wt 131 lb (59.4 kg)   SpO2 98%   BMI 21.14 kg/m   BP Readings from Last 3 Encounters:  02/23/20 (!) 176/54  12/25/19 132/68  11/19/19 (!) 153/86    Wt Readings from Last 3 Encounters:  02/23/20 131 lb (59.4 kg)  12/25/19 128 lb (58.1 kg)  11/19/19 130 lb 15.3 oz (59.4 kg)    General appearance: alert, cooperative and appears stated age Ears: normal TM's and external ear canals both ears Throat: lips, mucosa, and tongue normal; teeth and gums normal Neck: no adenopathy, no carotid bruit, supple, symmetrical, trachea midline and thyroid not enlarged, symmetric, no tenderness/mass/nodules Back: symmetric, no curvature. ROM normal. No CVA tenderness. Lungs: clear to auscultation bilaterally Heart: regular rate and rhythm, S1, S2 normal, no murmur, click, rub or gallop Abdomen: soft, non-tender; bowel sounds normal; no masses,  no organomegaly Pulses: 2+ and symmetric Skin: Skin color, texture, turgor normal. No rashes or lesions Lymph nodes: Cervical, supraclavicular, and axillary nodes normal.  Lab Results  Component Value Date   HGBA1C 6.8 (H) 02/23/2020   HGBA1C 7.0 (H) 09/06/2019   HGBA1C 6.4 04/03/2019    Lab Results  Component Value Date   CREATININE 0.61 02/23/2020   CREATININE 0.72 09/06/2019   CREATININE 0.66 04/03/2019    Lab Results  Component Value Date   WBC 5.2 02/23/2020   HGB 14.1 02/23/2020   HCT 42.7 02/23/2020   PLT 202.0 02/23/2020   GLUCOSE 97 02/23/2020   CHOL 204 (H) 10/30/2015   TRIG 94.0 10/30/2015   HDL 57.80 10/30/2015   LDLDIRECT 116.0 10/30/2015   LDLCALC 128 (H) 10/30/2015   ALT 18 02/23/2020   AST 21 02/23/2020   NA 136 02/23/2020   K 4.0 02/23/2020   CL 99 02/23/2020   CREATININE 0.61  02/23/2020   BUN 11 02/23/2020   CO2 32 02/23/2020   TSH 0.59 02/23/2020   INR 2.1 (H) 12/12/2018   HGBA1C 6.8 (H) 02/23/2020   MICROALBUR 1.0 02/23/2020    DG Chest 2 View  Result Date: 11/19/2019 CLINICAL DATA:  Patient complains of chest congestion, cough, nasal congestion, body aches, chills and sweats. Patient daughter reports that grandson who was living in her home was positive for Covid. Patient has not had the vaccines. Hx left breast ca, CHF, pacemaker EXAM: CHEST - 2 VIEW COMPARISON:  07/16/2019 FINDINGS: Pulmonary hyperinflation with coarse attenuated perihilar bronchovascular markings as before. No focal infiltrate or overt edema. Heart size and mediastinal contours are within normal limits. Aortic Atherosclerosis (ICD10-170.0). Stable left subclavian pacemaker lead extending towards the right ventricular apex. No effusion.  No pneumothorax. Visualized bones unremarkable. IMPRESSION: Pulmonary  hyperinflation. No acute disease. Electronically Signed   By: Lucrezia Europe M.D.   On: 11/19/2019 12:41    Assessment & Plan:   Problem List Items Addressed This Visit      Unprioritized   DM type 2 with diabetic peripheral neuropathy ([place])    Continues to be well managed  without glipizide .  BS have been controlled.  Lab Results  Component Value Date   HGBA1C 6.8 (H) 02/23/2020         Essential hypertension, benign    Advised to resume amlodipine 5 mg daily in the evening       Relevant Medications   warfarin (COUMADIN) 2 MG tablet   Invasive ductal carcinoma of breast, female, left ([place])    Treated with lumpectomy in 2020.  Did not tolerate adjuvant therapy. Sending back to dr. Bary Castilla for evaluation of thickening of skin noted at prior surgical site.       Relevant Medications   warfarin (COUMADIN) 2 MG tablet   Other Relevant Orders   Ambulatory referral to General Surgery   Pruritus - Primary    On prior visits she had a contact dermatitis on her her back as well as  eczema of ears.  Triamcinolone ointment prescribed for rash,  Allegra bid for itching CBC and LFTS are normal      Relevant Orders   CBC with Differential/Platelet (Completed)   Acquired thrombophilia ([place])    Her embolic stroke risk is managed with use of coumadin, INR is managed by Coumadin clinic at [facility].       Weight loss, unintentional    Resolved.  She has regained the weight she lost during her COVID 19 infection       Other Visit Diagnoses    Short of breath on exertion       Relevant Orders   DG Chest 2 View (Completed)   Abnormal weight loss       Diabetes mellitus without complication (HCC)       Personal history of covid-19       Need for immunization against influenza       Relevant Orders   Flu Vaccine QUAD 36+ mos IM (Completed)      I am having Ayaan L. Kozloski start on ipratropium. I am also having her maintain her nitroGLYCERIN, psyllium, Vitamin D3, isosorbide mononitrate, hydrochlorothiazide, albuterol, Polyethyl Glycol-Propyl Glycol, warfarin, OneTouch Delica Plus [id number], OneTouch Ultra, glucose blood, omeprazole, meclizine, glucose blood, triamcinolone cream, amLODipine, atenolol, lisinopril, and warfarin.  Meds ordered this encounter  Medications  . ipratropium (ATROVENT) 0.03 % nasal spray    Sig: Place 2 sprays into both nostrils every 12 (twelve) hours.    Dispense:  30 mL    Refill:  12    There are no discontinued medications.  Follow-up: No follow-ups on file.   Crecencio Mc, MD

## 2020-02-23 NOTE — Patient Instructions (Addendum)
  I want you to Take Allegra daily (for the itching and the post nasal drip ) and use the atrovent nasal spray twice daily   I want you to resume 5 mg amlodipine every day in the evening for your blood pressure   I am sending you to Dr Bary Castilla to look at your scar on your left breast

## 2020-02-25 MED ORDER — FUROSEMIDE 20 MG PO TABS: 20.0000 mg | ORAL_TABLET | Freq: Every day | ORAL | 0 refills | Status: DC

## 2020-02-25 NOTE — Assessment & Plan Note (Addendum)
Treated with lumpectomy in 2020.  Did not tolerate adjuvant therapy. Sending back to dr. Bary Castilla for evaluation of thickening of skin noted at prior surgical site.

## 2020-02-25 NOTE — Assessment & Plan Note (Signed)
Resolved.  She has regained the weight she lost during her COVID 19 infection

## 2020-02-25 NOTE — Assessment & Plan Note (Signed)
Advised to resume amlodipine 5 mg daily in the evening

## 2020-02-25 NOTE — Assessment & Plan Note (Signed)
On prior visits she had a contact dermatitis on her her back as well as eczema of ears.  Triamcinolone ointment prescribed for rash,  Allegra bid for itching CBC and LFTS are normal

## 2020-02-25 NOTE — Assessment & Plan Note (Signed)
Her embolic stroke risk is managed with use of coumadin, INR is managed by Coumadin clinic at [facility].

## 2020-02-25 NOTE — Assessment & Plan Note (Signed)
Continues to be well managed  without glipizide .  BS have been controlled.  Lab Results  Component Value Date   HGBA1C 6.8 (H) 02/23/2020

## 2020-02-27 LAB — SARS-COV-2 SEMI-QUANTITATIVE TOTAL ANTIBODY, SPIKE: SARS COV2 AB, Total Spike Semi QN: 171.3 U/mL — ABNORMAL HIGH (ref <0.8)

## 2020-02-27 NOTE — Telephone Encounter (Signed)
Daughter is aware that lasix has been sent in.

## 2020-03-05 ENCOUNTER — Other Ambulatory Visit: Payer: Self-pay | Admitting: Internal Medicine

## 2020-03-07 DIAGNOSIS — Z853 Personal history of malignant neoplasm of breast: Secondary | ICD-10-CM | POA: Diagnosis not present

## 2020-03-11 DIAGNOSIS — I480 Paroxysmal atrial fibrillation: Secondary | ICD-10-CM | POA: Diagnosis not present

## 2020-03-19 ENCOUNTER — Other Ambulatory Visit: Payer: Self-pay | Admitting: General Surgery

## 2020-03-19 DIAGNOSIS — C50212 Malignant neoplasm of upper-inner quadrant of left female breast: Secondary | ICD-10-CM | POA: Diagnosis not present

## 2020-03-19 DIAGNOSIS — C50912 Malignant neoplasm of unspecified site of left female breast: Secondary | ICD-10-CM | POA: Diagnosis not present

## 2020-03-27 LAB — SURGICAL PATHOLOGY

## 2020-04-01 ENCOUNTER — Other Ambulatory Visit: Payer: Self-pay | Admitting: Internal Medicine

## 2020-04-03 ENCOUNTER — Other Ambulatory Visit: Payer: Self-pay | Admitting: General Surgery

## 2020-04-03 DIAGNOSIS — C50912 Malignant neoplasm of unspecified site of left female breast: Secondary | ICD-10-CM

## 2020-04-03 NOTE — Progress Notes (Signed)
.  rad

## 2020-04-09 ENCOUNTER — Other Ambulatory Visit: Payer: Self-pay

## 2020-04-09 ENCOUNTER — Ambulatory Visit
Admission: RE | Admit: 2020-04-09 | Discharge: 2020-04-09 | Disposition: A | Discharge status: Home / Self Care | Payer: Medicare Other | Source: Ambulatory Visit | Attending: Radiation Oncology | Admitting: Radiation Oncology

## 2020-04-09 ENCOUNTER — Encounter: Payer: Self-pay | Admitting: Radiation Oncology

## 2020-04-09 VITALS — Temp 97.4°F | Wt 132.0 lb

## 2020-04-09 DIAGNOSIS — C50912 Malignant neoplasm of unspecified site of left female breast: Secondary | ICD-10-CM | POA: Diagnosis not present

## 2020-04-09 DIAGNOSIS — Z170 Estrogen receptor positive status [ER+]: Secondary | ICD-10-CM | POA: Diagnosis not present

## 2020-04-09 NOTE — Consult Note (Signed)
NEW PATIENT EVALUATION  Name: Leah Holmes  MRN: [id number]  Date:   04/09/2020     DOB: March 31, 1927   This 86 y.o. female patient presents to the clinic for initial evaluation of recurrent invasive mammary carcinoma ER/PR positive the left breast status post wide local excision with no adjuvant treatment in 2019.  REFERRING PHYSICIAN: Crecencio Mc, MD  CHIEF COMPLAINT: No chief complaint on file.   DIAGNOSIS: The encounter diagnosis was Invasive ductal carcinoma of breast, female, left ([place]).   PREVIOUS INVESTIGATIONS:  Mammograms reviewed Pathology report reviewed Clinical notes reviewed  HPI: Patient is a 86 year old female status post wide local excision of her left breast back in 2019 for a T2 N0 M0 invasive mammary carcinoma ER/PR positive.  She was not referred for any adjuvant treatment at that time based on her age.  Tumor was 2.6 cm with 2 lymph nodes 1 of sentinel node both negative for metastatic disease.  Tumor was overall grade 1.  She had a mammogram back in December 2020 which was BI-RADS 2 benign.  She is recently been seen by Dr. Tollie Pizza with some nodularity in her lumpectomy site with upon resection was positive with recurrent invasive mammary carcinoma low-grade.  Margins were negative.  Tumor was again ER/PR positive.  At this time patient is now referred to radiation oncology for consideration of adjuvant treatment.  She is somewhat frail having some difficulty ambulating is using a walker.  She specifically denies any breast tenderness.  PLANNED TREATMENT REGIMEN: Left hypofractionated whole breast radiation  PAST MEDICAL HISTORY:  has a past medical history of Atrial fibrillation ([place]) (2012), Breast cancer ([place]) (04/2018), CHF (congestive heart failure) ([place]), Colon polyp (2003), Coronary artery disease, Diabetes mellitus without complication ([place]), Dyspnea, Dysrhythmia, Environmental and seasonal allergies, GERD (gastroesophageal reflux disease),  Hyperlipidemia, Hypertension, Irritable bowel syndrome, Multiple rib fractures (03/15/2018), Polyp of colon, PONV (postoperative nausea and vomiting), Presence of permanent cardiac pacemaker, Tremor, Tremors of nervous system, and Ulcer.    PAST SURGICAL HISTORY:  Past Surgical History:  Procedure Laterality Date  . ABDOMINAL ADHESION SURGERY    . ABDOMINAL HYSTERECTOMY    . APPENDECTOMY    . BREAST BIOPSY Right late 80s/early 90s   benign  . BREAST BIOPSY Left 03/31/2018   [place] bx done with Dr. Bary Castilla, invasive ductal carcinoma  . BREAST LUMPECTOMY Left 04/22/2018   Procedure: BREAST LUMPECTOMY;  Surgeon: Robert Bellow, MD;  Location: [facility] ORS;  Service: General;  Laterality: Left;  . BREAST LUMPECTOMY Left 04/22/2018   [facility], negative LN, clear margins  . BREAST SURGERY Right    biopsy   . CESAREAN SECTION     x 2  . CHOLECYSTECTOMY  [id number]   Dr Burt Knack  . COLONOSCOPY  2003   Dr Theodosia Paling in [place]  . fibroid tumor    . PACEMAKER INSERTION N/A 01/28/2016   Procedure: INSERTION PACEMAKER;  Surgeon: Isaias Cowman, MD;  Location: [facility] ORS;  Service: Cardiovascular;  Laterality: N/A;  . SENTINEL NODE BIOPSY Left 04/22/2018   Procedure: SENTINEL NODE BIOPSY;  Surgeon: Robert Bellow, MD;  Location: [facility] ORS;  Service: General;  Laterality: Left;  . UPPER GI ENDOSCOPY  2003   Dr Theodosia Paling in [place]: family history includes Breast cancer in her cousin; Breast cancer (age of onset: 37) in an other family member; Breast cancer (age of onset: 12) in her maternal aunt; Cancer (age of onset: 35) in her sister.  SOCIAL HISTORY:  reports that she has never smoked. She has quit using smokeless tobacco.  Her smokeless tobacco use included snuff. She reports that she does not drink alcohol and does not use drugs.  ALLERGIES: Codeine, Penicillins, Amlodipine, Atorvastatin, Clonidine derivatives, Gabapentin, Losartan, Morphine, Morphine and related, Reglan  [metoclopramide], and Hydralazine  MEDICATIONS:  Current Outpatient Medications  Medication Sig Dispense Refill  . amLODipine (NORVASC) 5 MG tablet TAKE ONE TABLET (5 mg) BY MOUTH every morning 90 tablet 3  . atenolol (TENORMIN) 50 MG tablet Take 1 tablet by mouth twice daily 180 tablet 0  . Cholecalciferol (VITAMIN D3) 1000 UNITS CAPS Take 1,000 Units by mouth every morning.     Marland Kitchen glucose blood (ONE TOUCH ULTRA TEST) test strip USE 1 STRIP TO CHECK GLUCOSE TWICE DAILY  Dx code: E11.9 200 each 3  . glucose blood (ONE TOUCH ULTRA TEST) test strip USE ONE STRIP TO CHECK GLUCOSE ONCE  DAILY 100 each 3  . hydrochlorothiazide (HYDRODIURIL) 25 MG tablet Take 25 mg by mouth daily.     . isosorbide mononitrate (IMDUR) 60 MG 24 hr tablet Take 60 mg by mouth 2 (two) times daily.    . Lancets (ONETOUCH DELICA PLUS [id number]) MISC Inject 2 Devices into the skin daily. DX Code E11.9. 200 each 2  . lisinopril (ZESTRIL) 20 MG tablet Take 2 tablets by mouth twice daily 180 tablet 0  . meclizine (ANTIVERT) 25 MG tablet Take 1 tablet by mouth three times daily as needed 90 tablet 0  . nitroGLYCERIN (NITROSTAT) 0.4 MG SL tablet Place 0.4 mg under the tongue every 5 (five) minutes as needed for chest pain.    Marland Kitchen omeprazole (PRILOSEC) 20 MG capsule Take 1 capsule by mouth once daily 90 capsule 1  . ONETOUCH ULTRA test strip USE 1 STRIP TO CHECK GLUCOSE TWICE DAILY 100 each 0  . Polyethyl Glycol-Propyl Glycol 0.4-0.3 % SOLN Place 1 drop into both eyes 3 (three) times daily as needed (for dry/irritated eyes.).    Marland Kitchen psyllium (METAMUCIL) 58.6 % packet Take 1 packet by mouth daily as needed (for regularity).     . triamcinolone cream (KENALOG) 0.1 % Apply 1 application topically 2 (two) times daily. 30 g 0  . warfarin (COUMADIN) 2 MG tablet Take 4 mg by mouth at bedtime.     Marland Kitchen warfarin (COUMADIN) 4 MG tablet Take 4 mg by mouth daily at 6 PM.     . albuterol (PROAIR HFA) 108 (90 Base) MCG/ACT inhaler Inhale 1-2 puffs  into the lungs every 4 (four) hours as needed for wheezing or shortness of breath. (Patient not taking: Reported on 04/09/2020) 1 Inhaler 5  . ipratropium (ATROVENT) 0.03 % nasal spray Place 2 sprays into both nostrils every 12 (twelve) hours. (Patient not taking: Reported on 04/09/2020) 30 mL 12   No current facility-administered medications for this encounter.    ECOG PERFORMANCE STATUS:  0 - Asymptomatic  REVIEW OF SYSTEMS: Patient denies any weight loss, fatigue, weakness, fever, chills or night sweats. Patient denies any loss of vision, blurred vision. Patient denies any ringing  of the ears or hearing loss. No irregular heartbeat. Patient denies heart murmur or history of fainting. Patient denies any chest pain or pain radiating to her upper extremities. Patient denies any shortness of breath, difficulty breathing at night, cough or hemoptysis. Patient denies any swelling in the lower legs. Patient denies any nausea vomiting, vomiting of blood, or coffee ground material in the vomitus. Patient denies any stomach pain. Patient  states has had normal bowel movements no significant constipation or diarrhea. Patient denies any dysuria, hematuria or significant nocturia. Patient denies any problems walking, swelling in the joints or loss of balance. Patient denies any skin changes, loss of hair or loss of weight. Patient denies any excessive worrying or anxiety or significant depression. Patient denies any problems with insomnia. Patient denies excessive thirst, polyuria, polydipsia. Patient denies any swollen glands, patient denies easy bruising or easy bleeding. Patient denies any recent infections, allergies or URI. Patient "s visual fields have not changed significantly in recent time.   PHYSICAL EXAM: Temp (!) 97.4 F (36.3 C) (Tympanic)   Wt 132 lb (59.9 kg)   BMI 21.31 kg/m  Patient is status post wide local excision of the left breast.  There is some firmness below the scar site consistent  with possible seroma.  No other dominant masses noted in either breast.  No axillary or supraclavicular adenopathy is noted.  Well-developed well-nourished patient in NAD. HEENT reveals PERLA, EOMI, discs not visualized.  Oral cavity is clear. No oral mucosal lesions are identified. Neck is clear without evidence of cervical or supraclavicular adenopathy. Lungs are clear to A&P. Cardiac examination is essentially unremarkable with regular rate and rhythm without murmur rub or thrill. Abdomen is benign with no organomegaly or masses noted. Motor sensory and DTR levels are equal and symmetric in the upper and lower extremities. Cranial nerves II through XII are grossly intact. Proprioception is intact. No peripheral adenopathy or edema is identified. No motor or sensory levels are noted. Crude visual fields are within normal range.  LABORATORY DATA: Pathology report reviewed    RADIOLOGY RESULTS: Mammograms reviewed   IMPRESSION: Recurrent invasive mammary carcinoma low-grade of the left breast status post wide local excision in 2019 now with recurrent disease status post resection.  In 86 year old female  PLAN: At this time elect to go ahead with a hypofractionated course of treatment to her left breast we will treat her over 3 weeks.  Would also boost to scar another 1000 cGy in 5 fractions using electron beam.  Risks and benefits of treatment occluding skin reaction fatigue alteration of blood counts possible inclusion of superficial lung all were discussed in detail with the patient.  I believe based on the low grade nature of her disease do not have to include a peripheral lymphatics and can do a hypofractionated course of treatment.  Patient also will probably benefit from antiestrogen therapy we will leave that to Dr. Tollie Pizza for his input.  Patient and daughter both comprehend my treatment plan well.  I have personally set up and ordered CT simulation for early next week to allow some more healing of  her breast.  I would like to take this opportunity to thank you for allowing me to participate in the care of your patient.Noreene Filbert, MD

## 2020-04-10 DIAGNOSIS — I480 Paroxysmal atrial fibrillation: Secondary | ICD-10-CM | POA: Diagnosis not present

## 2020-04-15 ENCOUNTER — Ambulatory Visit
Admission: RE | Admit: 2020-04-15 | Discharge: 2020-04-15 | Disposition: A | Discharge status: Home / Self Care | Payer: Medicare Other | Source: Ambulatory Visit | Attending: Radiation Oncology | Admitting: Radiation Oncology

## 2020-04-15 DIAGNOSIS — C50912 Malignant neoplasm of unspecified site of left female breast: Secondary | ICD-10-CM | POA: Diagnosis not present

## 2020-04-15 DIAGNOSIS — Z510 Encounter for antineoplastic radiation therapy: Secondary | ICD-10-CM | POA: Diagnosis not present

## 2020-04-15 DIAGNOSIS — Z170 Estrogen receptor positive status [ER+]: Secondary | ICD-10-CM | POA: Diagnosis not present

## 2020-04-17 DIAGNOSIS — Z170 Estrogen receptor positive status [ER+]: Secondary | ICD-10-CM | POA: Diagnosis not present

## 2020-04-17 DIAGNOSIS — C50912 Malignant neoplasm of unspecified site of left female breast: Secondary | ICD-10-CM | POA: Diagnosis not present

## 2020-04-17 DIAGNOSIS — Z510 Encounter for antineoplastic radiation therapy: Secondary | ICD-10-CM | POA: Diagnosis not present

## 2020-04-18 ENCOUNTER — Other Ambulatory Visit: Payer: Self-pay | Admitting: Internal Medicine

## 2020-04-19 ENCOUNTER — Other Ambulatory Visit: Payer: Self-pay | Admitting: *Deleted

## 2020-04-19 DIAGNOSIS — C50912 Malignant neoplasm of unspecified site of left female breast: Secondary | ICD-10-CM

## 2020-04-22 ENCOUNTER — Ambulatory Visit: Admission: RE | Admit: 2020-04-22 | Payer: Medicare Other | Source: Ambulatory Visit

## 2020-04-22 DIAGNOSIS — C50912 Malignant neoplasm of unspecified site of left female breast: Secondary | ICD-10-CM | POA: Diagnosis not present

## 2020-04-22 DIAGNOSIS — Z170 Estrogen receptor positive status [ER+]: Secondary | ICD-10-CM | POA: Diagnosis not present

## 2020-04-22 DIAGNOSIS — Z510 Encounter for antineoplastic radiation therapy: Secondary | ICD-10-CM | POA: Diagnosis not present

## 2020-04-23 ENCOUNTER — Ambulatory Visit
Admission: RE | Admit: 2020-04-23 | Discharge: 2020-04-23 | Disposition: A | Discharge status: Home / Self Care | Payer: Medicare Other | Source: Ambulatory Visit | Attending: Radiation Oncology | Admitting: Radiation Oncology

## 2020-04-23 DIAGNOSIS — C50912 Malignant neoplasm of unspecified site of left female breast: Secondary | ICD-10-CM | POA: Diagnosis not present

## 2020-04-23 DIAGNOSIS — Z510 Encounter for antineoplastic radiation therapy: Secondary | ICD-10-CM | POA: Diagnosis not present

## 2020-04-23 DIAGNOSIS — Z170 Estrogen receptor positive status [ER+]: Secondary | ICD-10-CM | POA: Diagnosis not present

## 2020-04-24 ENCOUNTER — Ambulatory Visit
Admission: RE | Admit: 2020-04-24 | Discharge: 2020-04-24 | Disposition: A | Discharge status: Home / Self Care | Payer: Medicare Other | Source: Ambulatory Visit | Attending: Radiation Oncology | Admitting: Radiation Oncology

## 2020-04-24 DIAGNOSIS — C50912 Malignant neoplasm of unspecified site of left female breast: Secondary | ICD-10-CM | POA: Diagnosis not present

## 2020-04-24 DIAGNOSIS — Z170 Estrogen receptor positive status [ER+]: Secondary | ICD-10-CM | POA: Diagnosis not present

## 2020-04-24 DIAGNOSIS — Z510 Encounter for antineoplastic radiation therapy: Secondary | ICD-10-CM | POA: Diagnosis not present

## 2020-04-25 ENCOUNTER — Ambulatory Visit
Admission: RE | Admit: 2020-04-25 | Discharge: 2020-04-25 | Disposition: A | Discharge status: Home / Self Care | Payer: Medicare Other | Source: Ambulatory Visit | Attending: Radiation Oncology | Admitting: Radiation Oncology

## 2020-04-25 DIAGNOSIS — Z510 Encounter for antineoplastic radiation therapy: Secondary | ICD-10-CM | POA: Diagnosis not present

## 2020-04-25 DIAGNOSIS — C50912 Malignant neoplasm of unspecified site of left female breast: Secondary | ICD-10-CM | POA: Diagnosis not present

## 2020-04-25 DIAGNOSIS — Z170 Estrogen receptor positive status [ER+]: Secondary | ICD-10-CM | POA: Diagnosis not present

## 2020-04-26 ENCOUNTER — Ambulatory Visit
Admission: RE | Admit: 2020-04-26 | Discharge: 2020-04-26 | Disposition: A | Discharge status: Home / Self Care | Payer: Medicare Other | Source: Ambulatory Visit | Attending: Radiation Oncology | Admitting: Radiation Oncology

## 2020-04-26 DIAGNOSIS — Z510 Encounter for antineoplastic radiation therapy: Secondary | ICD-10-CM | POA: Diagnosis not present

## 2020-04-26 DIAGNOSIS — C50912 Malignant neoplasm of unspecified site of left female breast: Secondary | ICD-10-CM | POA: Diagnosis not present

## 2020-04-29 ENCOUNTER — Ambulatory Visit
Admission: RE | Admit: 2020-04-29 | Discharge: 2020-04-29 | Disposition: A | Discharge status: Home / Self Care | Payer: Medicare Other | Source: Ambulatory Visit | Attending: Radiation Oncology | Admitting: Radiation Oncology

## 2020-04-29 DIAGNOSIS — Z510 Encounter for antineoplastic radiation therapy: Secondary | ICD-10-CM | POA: Diagnosis not present

## 2020-04-29 DIAGNOSIS — Z170 Estrogen receptor positive status [ER+]: Secondary | ICD-10-CM | POA: Diagnosis not present

## 2020-04-29 DIAGNOSIS — C50912 Malignant neoplasm of unspecified site of left female breast: Secondary | ICD-10-CM | POA: Diagnosis not present

## 2020-04-30 ENCOUNTER — Ambulatory Visit
Admission: RE | Admit: 2020-04-30 | Discharge: 2020-04-30 | Disposition: A | Discharge status: Home / Self Care | Payer: Medicare Other | Source: Ambulatory Visit | Attending: Radiation Oncology | Admitting: Radiation Oncology

## 2020-04-30 DIAGNOSIS — Z510 Encounter for antineoplastic radiation therapy: Secondary | ICD-10-CM | POA: Diagnosis not present

## 2020-04-30 DIAGNOSIS — I6523 Occlusion and stenosis of bilateral carotid arteries: Secondary | ICD-10-CM | POA: Diagnosis not present

## 2020-04-30 DIAGNOSIS — I272 Pulmonary hypertension, unspecified: Secondary | ICD-10-CM | POA: Diagnosis not present

## 2020-04-30 DIAGNOSIS — I10 Essential (primary) hypertension: Secondary | ICD-10-CM | POA: Diagnosis not present

## 2020-04-30 DIAGNOSIS — R0600 Dyspnea, unspecified: Secondary | ICD-10-CM | POA: Diagnosis not present

## 2020-04-30 DIAGNOSIS — I482 Chronic atrial fibrillation, unspecified: Secondary | ICD-10-CM | POA: Diagnosis not present

## 2020-04-30 DIAGNOSIS — I2510 Atherosclerotic heart disease of native coronary artery without angina pectoris: Secondary | ICD-10-CM | POA: Diagnosis not present

## 2020-04-30 DIAGNOSIS — I071 Rheumatic tricuspid insufficiency: Secondary | ICD-10-CM | POA: Diagnosis not present

## 2020-04-30 DIAGNOSIS — C50912 Malignant neoplasm of unspecified site of left female breast: Secondary | ICD-10-CM | POA: Diagnosis not present

## 2020-04-30 DIAGNOSIS — I340 Nonrheumatic mitral (valve) insufficiency: Secondary | ICD-10-CM | POA: Diagnosis not present

## 2020-04-30 DIAGNOSIS — Z170 Estrogen receptor positive status [ER+]: Secondary | ICD-10-CM | POA: Diagnosis not present

## 2020-04-30 DIAGNOSIS — I495 Sick sinus syndrome: Secondary | ICD-10-CM | POA: Diagnosis not present

## 2020-04-30 DIAGNOSIS — I208 Other forms of angina pectoris: Secondary | ICD-10-CM | POA: Diagnosis not present

## 2020-05-01 ENCOUNTER — Ambulatory Visit
Admission: RE | Admit: 2020-05-01 | Discharge: 2020-05-01 | Disposition: A | Discharge status: Home / Self Care | Payer: Medicare Other | Source: Ambulatory Visit | Attending: Radiation Oncology | Admitting: Radiation Oncology

## 2020-05-01 DIAGNOSIS — Z510 Encounter for antineoplastic radiation therapy: Secondary | ICD-10-CM | POA: Diagnosis not present

## 2020-05-01 DIAGNOSIS — C50912 Malignant neoplasm of unspecified site of left female breast: Secondary | ICD-10-CM | POA: Diagnosis not present

## 2020-05-01 DIAGNOSIS — Z170 Estrogen receptor positive status [ER+]: Secondary | ICD-10-CM | POA: Diagnosis not present

## 2020-05-02 ENCOUNTER — Ambulatory Visit: Payer: Medicare Other

## 2020-05-03 ENCOUNTER — Ambulatory Visit: Payer: Medicare Other

## 2020-05-06 ENCOUNTER — Ambulatory Visit
Admission: RE | Admit: 2020-05-06 | Discharge: 2020-05-06 | Disposition: A | Discharge status: Home / Self Care | Payer: Medicare Other | Source: Ambulatory Visit | Attending: Radiation Oncology | Admitting: Radiation Oncology

## 2020-05-06 DIAGNOSIS — C50912 Malignant neoplasm of unspecified site of left female breast: Secondary | ICD-10-CM | POA: Diagnosis not present

## 2020-05-06 DIAGNOSIS — Z510 Encounter for antineoplastic radiation therapy: Secondary | ICD-10-CM | POA: Diagnosis not present

## 2020-05-06 DIAGNOSIS — Z170 Estrogen receptor positive status [ER+]: Secondary | ICD-10-CM | POA: Diagnosis not present

## 2020-05-07 ENCOUNTER — Ambulatory Visit
Admission: RE | Admit: 2020-05-07 | Discharge: 2020-05-07 | Disposition: A | Discharge status: Home / Self Care | Payer: Medicare Other | Source: Ambulatory Visit | Attending: Radiation Oncology | Admitting: Radiation Oncology

## 2020-05-07 ENCOUNTER — Inpatient Hospital Stay: Payer: Medicare Other | Attending: Radiation Oncology

## 2020-05-07 DIAGNOSIS — Z170 Estrogen receptor positive status [ER+]: Secondary | ICD-10-CM | POA: Diagnosis not present

## 2020-05-07 DIAGNOSIS — C50912 Malignant neoplasm of unspecified site of left female breast: Secondary | ICD-10-CM | POA: Diagnosis not present

## 2020-05-07 DIAGNOSIS — Z510 Encounter for antineoplastic radiation therapy: Secondary | ICD-10-CM | POA: Diagnosis not present

## 2020-05-07 LAB — CBC
HCT: 41.2 % (ref 36.0–46.0)
Hemoglobin: 14.1 g/dL (ref 12.0–15.0)
MCH: 32 pg (ref 26.0–34.0)
MCHC: 34.2 g/dL (ref 30.0–36.0)
MCV: 93.6 fL (ref 80.0–100.0)
Platelets: 188 10*3/uL (ref 150–400)
RBC: 4.4 MIL/uL (ref 3.87–5.11)
RDW: 13.1 % (ref 11.5–15.5)
WBC: 4.4 10*3/uL (ref 4.0–10.5)
nRBC: 0 % (ref 0.0–0.2)

## 2020-05-08 ENCOUNTER — Ambulatory Visit
Admission: RE | Admit: 2020-05-08 | Discharge: 2020-05-08 | Disposition: A | Discharge status: Home / Self Care | Payer: Medicare Other | Source: Ambulatory Visit | Attending: Radiation Oncology | Admitting: Radiation Oncology

## 2020-05-08 DIAGNOSIS — C50912 Malignant neoplasm of unspecified site of left female breast: Secondary | ICD-10-CM | POA: Diagnosis not present

## 2020-05-08 DIAGNOSIS — Z170 Estrogen receptor positive status [ER+]: Secondary | ICD-10-CM | POA: Diagnosis not present

## 2020-05-08 DIAGNOSIS — Z510 Encounter for antineoplastic radiation therapy: Secondary | ICD-10-CM | POA: Diagnosis not present

## 2020-05-13 ENCOUNTER — Other Ambulatory Visit: Payer: Self-pay | Admitting: Internal Medicine

## 2020-05-13 ENCOUNTER — Ambulatory Visit
Admission: RE | Admit: 2020-05-13 | Discharge: 2020-05-13 | Disposition: A | Discharge status: Home / Self Care | Payer: Medicare Other | Source: Ambulatory Visit | Attending: Radiation Oncology | Admitting: Radiation Oncology

## 2020-05-13 DIAGNOSIS — C50912 Malignant neoplasm of unspecified site of left female breast: Secondary | ICD-10-CM | POA: Diagnosis not present

## 2020-05-13 DIAGNOSIS — Z170 Estrogen receptor positive status [ER+]: Secondary | ICD-10-CM | POA: Diagnosis not present

## 2020-05-13 DIAGNOSIS — Z510 Encounter for antineoplastic radiation therapy: Secondary | ICD-10-CM | POA: Diagnosis not present

## 2020-05-14 ENCOUNTER — Ambulatory Visit
Admission: RE | Admit: 2020-05-14 | Discharge: 2020-05-14 | Disposition: A | Discharge status: Home / Self Care | Payer: Medicare Other | Source: Ambulatory Visit | Attending: Radiation Oncology | Admitting: Radiation Oncology

## 2020-05-14 DIAGNOSIS — Z170 Estrogen receptor positive status [ER+]: Secondary | ICD-10-CM | POA: Diagnosis not present

## 2020-05-14 DIAGNOSIS — C50912 Malignant neoplasm of unspecified site of left female breast: Secondary | ICD-10-CM | POA: Diagnosis not present

## 2020-05-14 DIAGNOSIS — Z510 Encounter for antineoplastic radiation therapy: Secondary | ICD-10-CM | POA: Diagnosis not present

## 2020-05-15 ENCOUNTER — Ambulatory Visit
Admission: RE | Admit: 2020-05-15 | Discharge: 2020-05-15 | Disposition: A | Discharge status: Home / Self Care | Payer: Medicare Other | Source: Ambulatory Visit | Attending: Radiation Oncology | Admitting: Radiation Oncology

## 2020-05-15 DIAGNOSIS — C50912 Malignant neoplasm of unspecified site of left female breast: Secondary | ICD-10-CM | POA: Insufficient documentation

## 2020-05-15 DIAGNOSIS — Z170 Estrogen receptor positive status [ER+]: Secondary | ICD-10-CM | POA: Diagnosis not present

## 2020-05-15 DIAGNOSIS — I480 Paroxysmal atrial fibrillation: Secondary | ICD-10-CM | POA: Diagnosis not present

## 2020-05-15 DIAGNOSIS — Z510 Encounter for antineoplastic radiation therapy: Secondary | ICD-10-CM | POA: Insufficient documentation

## 2020-05-16 ENCOUNTER — Ambulatory Visit
Admission: RE | Admit: 2020-05-16 | Discharge: 2020-05-16 | Disposition: A | Discharge status: Home / Self Care | Payer: Medicare Other | Source: Ambulatory Visit | Attending: Radiation Oncology | Admitting: Radiation Oncology

## 2020-05-16 DIAGNOSIS — Z510 Encounter for antineoplastic radiation therapy: Secondary | ICD-10-CM | POA: Diagnosis not present

## 2020-05-16 DIAGNOSIS — C50912 Malignant neoplasm of unspecified site of left female breast: Secondary | ICD-10-CM | POA: Diagnosis not present

## 2020-05-17 ENCOUNTER — Ambulatory Visit
Admission: RE | Admit: 2020-05-17 | Discharge: 2020-05-17 | Disposition: A | Discharge status: Home / Self Care | Payer: Medicare Other | Source: Ambulatory Visit | Attending: Radiation Oncology | Admitting: Radiation Oncology

## 2020-05-17 DIAGNOSIS — C50912 Malignant neoplasm of unspecified site of left female breast: Secondary | ICD-10-CM | POA: Diagnosis not present

## 2020-05-17 DIAGNOSIS — Z170 Estrogen receptor positive status [ER+]: Secondary | ICD-10-CM | POA: Diagnosis not present

## 2020-05-17 DIAGNOSIS — Z510 Encounter for antineoplastic radiation therapy: Secondary | ICD-10-CM | POA: Diagnosis not present

## 2020-05-20 ENCOUNTER — Ambulatory Visit
Admission: RE | Admit: 2020-05-20 | Discharge: 2020-05-20 | Disposition: A | Discharge status: Home / Self Care | Payer: Medicare Other | Source: Ambulatory Visit | Attending: Radiation Oncology | Admitting: Radiation Oncology

## 2020-05-20 DIAGNOSIS — Z510 Encounter for antineoplastic radiation therapy: Secondary | ICD-10-CM | POA: Diagnosis not present

## 2020-05-20 DIAGNOSIS — Z170 Estrogen receptor positive status [ER+]: Secondary | ICD-10-CM | POA: Diagnosis not present

## 2020-05-20 DIAGNOSIS — C50912 Malignant neoplasm of unspecified site of left female breast: Secondary | ICD-10-CM | POA: Diagnosis not present

## 2020-05-21 ENCOUNTER — Ambulatory Visit
Admission: RE | Admit: 2020-05-21 | Discharge: 2020-05-21 | Disposition: A | Discharge status: Home / Self Care | Payer: Medicare Other | Source: Ambulatory Visit | Attending: Radiation Oncology | Admitting: Radiation Oncology

## 2020-05-21 DIAGNOSIS — Z510 Encounter for antineoplastic radiation therapy: Secondary | ICD-10-CM | POA: Diagnosis not present

## 2020-05-21 DIAGNOSIS — C50912 Malignant neoplasm of unspecified site of left female breast: Secondary | ICD-10-CM | POA: Diagnosis not present

## 2020-05-22 ENCOUNTER — Ambulatory Visit
Admission: RE | Admit: 2020-05-22 | Discharge: 2020-05-22 | Disposition: A | Discharge status: Home / Self Care | Payer: Medicare Other | Source: Ambulatory Visit | Attending: Radiation Oncology | Admitting: Radiation Oncology

## 2020-05-22 DIAGNOSIS — C50912 Malignant neoplasm of unspecified site of left female breast: Secondary | ICD-10-CM | POA: Diagnosis not present

## 2020-05-22 DIAGNOSIS — Z510 Encounter for antineoplastic radiation therapy: Secondary | ICD-10-CM | POA: Diagnosis not present

## 2020-05-23 ENCOUNTER — Ambulatory Visit: Payer: Medicare Other

## 2020-05-23 ENCOUNTER — Ambulatory Visit
Admission: RE | Admit: 2020-05-23 | Discharge: 2020-05-23 | Disposition: A | Discharge status: Home / Self Care | Payer: Medicare Other | Source: Ambulatory Visit | Attending: Radiation Oncology | Admitting: Radiation Oncology

## 2020-05-23 DIAGNOSIS — Z510 Encounter for antineoplastic radiation therapy: Secondary | ICD-10-CM | POA: Diagnosis not present

## 2020-05-23 DIAGNOSIS — C50912 Malignant neoplasm of unspecified site of left female breast: Secondary | ICD-10-CM | POA: Diagnosis not present

## 2020-05-24 ENCOUNTER — Ambulatory Visit
Admission: RE | Admit: 2020-05-24 | Discharge: 2020-05-24 | Disposition: A | Discharge status: Home / Self Care | Payer: Medicare Other | Source: Ambulatory Visit | Attending: Radiation Oncology | Admitting: Radiation Oncology

## 2020-05-24 DIAGNOSIS — C50912 Malignant neoplasm of unspecified site of left female breast: Secondary | ICD-10-CM | POA: Diagnosis not present

## 2020-05-24 DIAGNOSIS — Z510 Encounter for antineoplastic radiation therapy: Secondary | ICD-10-CM | POA: Diagnosis not present

## 2020-05-24 DIAGNOSIS — Z170 Estrogen receptor positive status [ER+]: Secondary | ICD-10-CM | POA: Diagnosis not present

## 2020-05-27 ENCOUNTER — Ambulatory Visit
Admission: RE | Admit: 2020-05-27 | Discharge: 2020-05-27 | Disposition: A | Discharge status: Home / Self Care | Payer: Medicare Other | Source: Ambulatory Visit | Attending: Radiation Oncology | Admitting: Radiation Oncology

## 2020-05-27 DIAGNOSIS — C50912 Malignant neoplasm of unspecified site of left female breast: Secondary | ICD-10-CM | POA: Diagnosis not present

## 2020-05-27 DIAGNOSIS — Z510 Encounter for antineoplastic radiation therapy: Secondary | ICD-10-CM | POA: Diagnosis not present

## 2020-05-30 ENCOUNTER — Other Ambulatory Visit: Payer: Self-pay | Admitting: Internal Medicine

## 2020-06-18 DIAGNOSIS — I480 Paroxysmal atrial fibrillation: Secondary | ICD-10-CM | POA: Diagnosis not present

## 2020-06-24 ENCOUNTER — Other Ambulatory Visit: Payer: Self-pay | Admitting: Internal Medicine

## 2020-06-28 ENCOUNTER — Ambulatory Visit
Admission: RE | Admit: 2020-06-28 | Discharge: 2020-06-28 | Disposition: A | Discharge status: Home / Self Care | Payer: Medicare Other | Source: Ambulatory Visit | Attending: Radiation Oncology | Admitting: Radiation Oncology

## 2020-06-28 ENCOUNTER — Encounter: Payer: Self-pay | Admitting: Radiation Oncology

## 2020-06-28 ENCOUNTER — Other Ambulatory Visit: Payer: Self-pay

## 2020-06-28 VITALS — BP 125/63 | HR 64 | Temp 96.9°F | Resp 16 | Wt 130.4 lb

## 2020-06-28 DIAGNOSIS — Z923 Personal history of irradiation: Secondary | ICD-10-CM | POA: Diagnosis not present

## 2020-06-28 DIAGNOSIS — C50912 Malignant neoplasm of unspecified site of left female breast: Secondary | ICD-10-CM

## 2020-06-28 DIAGNOSIS — Z170 Estrogen receptor positive status [ER+]: Secondary | ICD-10-CM | POA: Insufficient documentation

## 2020-06-28 NOTE — Progress Notes (Signed)
Radiation Oncology Follow up Note  Name: Leah Holmes   Date:   06/28/2020 MRN:  [id number] DOB: Dec 07, 1926    This 87 y.o. female presents to the clinic today for 1 month follow-up status post whole breast radiation to her left breas inpatient I was status post wide local excision for T2 lesion back in 2019 now with recurrent disease in the left breast status post resection.  REFERRING PROVIDER: Crecencio Mc, MD  HPI: Patient is a 87 year old female who presented with recurrent disease in her left breast after resection in 2019 for T2N0 invasive mammary carcinoma.  She underwent whole breast radiation in a hypofractionated manner which she tolerated fairly well.  She is seen today 1 month out and is doing well.  She specifically denies breast tenderness cough or bone pain..  She has not been*started on antiestrogen therapy I believe she has had some difficulty with side effects to that in the past.  COMPLICATIONS OF TREATMENT: none  FOLLOW UP COMPLIANCE: keeps appointments   PHYSICAL EXAM:  BP 125/63   Pulse 64   Temp (!) 96.9 F (36.1 C)   Resp 16   Wt 130 lb 6.4 oz (59.1 kg)   SpO2 98%   BMI 21.05 kg/m  Lungs are clear to A&P cardiac examination essentially unremarkable with regular rate and rhythm. No dominant mass or nodularity is noted in either breast in 2 positions examined. Incision is well-healed. No axillary or supraclavicular adenopathy is appreciated. Cosmetic result is excellent.  Well-developed well-nourished patient in NAD. HEENT reveals PERLA, EOMI, discs not visualized.  Oral cavity is clear. No oral mucosal lesions are identified. Neck is clear without evidence of cervical or supraclavicular adenopathy. Lungs are clear to A&P. Cardiac examination is essentially unremarkable with regular rate and rhythm without murmur rub or thrill. Abdomen is benign with no organomegaly or masses noted. Motor sensory and DTR levels are equal and symmetric in the upper and  lower extremities. Cranial nerves II through XII are grossly intact. Proprioception is intact. No peripheral adenopathy or edema is identified. No motor or sensory levels are noted. Crude visual fields are within normal range.  RADIOLOGY RESULTS: No current films to review  PLAN: Present time patient is doing well recovered nicely from her whole breast radiation.  I am pleased with her overall progress.  I have asked to see her back in 4 to 5 months for follow-up.  She continues close follow-up care with Dr. Tollie Pizza.  Patient knows to call with any concerns.  I would like to take this opportunity to thank you for allowing me to participate in the care of your patient.Noreene Filbert, MD

## 2020-06-29 ENCOUNTER — Other Ambulatory Visit: Payer: Self-pay | Admitting: Internal Medicine

## 2020-07-17 DIAGNOSIS — I480 Paroxysmal atrial fibrillation: Secondary | ICD-10-CM | POA: Diagnosis not present

## 2020-08-10 ENCOUNTER — Other Ambulatory Visit: Payer: Self-pay | Admitting: Internal Medicine

## 2020-08-14 DIAGNOSIS — I480 Paroxysmal atrial fibrillation: Secondary | ICD-10-CM | POA: Diagnosis not present

## 2020-09-13 ENCOUNTER — Ambulatory Visit (INDEPENDENT_AMBULATORY_CARE_PROVIDER_SITE_OTHER): Payer: Medicare Other | Admitting: Internal Medicine

## 2020-09-13 ENCOUNTER — Other Ambulatory Visit: Payer: Self-pay

## 2020-09-13 ENCOUNTER — Encounter: Payer: Self-pay | Admitting: Internal Medicine

## 2020-09-13 VITALS — BP 156/78 | HR 68 | Temp 97.8°F | Resp 16 | Ht 66.0 in | Wt 132.2 lb

## 2020-09-13 DIAGNOSIS — I10 Essential (primary) hypertension: Secondary | ICD-10-CM

## 2020-09-13 DIAGNOSIS — D6869 Other thrombophilia: Secondary | ICD-10-CM

## 2020-09-13 DIAGNOSIS — T466X5A Adverse effect of antihyperlipidemic and antiarteriosclerotic drugs, initial encounter: Secondary | ICD-10-CM | POA: Diagnosis not present

## 2020-09-13 DIAGNOSIS — M791 Myalgia, unspecified site: Secondary | ICD-10-CM

## 2020-09-13 DIAGNOSIS — E1142 Type 2 diabetes mellitus with diabetic polyneuropathy: Secondary | ICD-10-CM | POA: Diagnosis not present

## 2020-09-13 DIAGNOSIS — E1169 Type 2 diabetes mellitus with other specified complication: Secondary | ICD-10-CM

## 2020-09-13 DIAGNOSIS — C50912 Malignant neoplasm of unspecified site of left female breast: Secondary | ICD-10-CM

## 2020-09-13 DIAGNOSIS — I495 Sick sinus syndrome: Secondary | ICD-10-CM | POA: Diagnosis not present

## 2020-09-13 DIAGNOSIS — E785 Hyperlipidemia, unspecified: Secondary | ICD-10-CM | POA: Diagnosis not present

## 2020-09-13 DIAGNOSIS — I700 Atherosclerosis of aorta: Secondary | ICD-10-CM

## 2020-09-13 LAB — COMPREHENSIVE METABOLIC PANEL
ALT: 14 U/L (ref 0–35)
AST: 19 U/L (ref 0–37)
Albumin: 3.8 g/dL (ref 3.5–5.2)
Alkaline Phosphatase: 63 U/L (ref 39–117)
BUN: 9 mg/dL (ref 6–23)
CO2: 32 mEq/L (ref 19–32)
Calcium: 9.5 mg/dL (ref 8.4–10.5)
Chloride: 98 mEq/L (ref 96–112)
Creatinine, Ser: 0.64 mg/dL (ref 0.40–1.20)
GFR: 76.93 mL/min (ref >60.00)
Glucose, Bld: 151 mg/dL — ABNORMAL HIGH (ref 70–99)
Potassium: 3.5 mEq/L (ref 3.5–5.1)
Sodium: 136 mEq/L (ref 135–145)
Total Bilirubin: 1 mg/dL (ref 0.2–1.2)
Total Protein: 6.4 g/dL (ref 6.0–8.3)

## 2020-09-13 LAB — LIPID PANEL
Cholesterol: 174 mg/dL (ref 0–200)
HDL: 60.1 mg/dL (ref >39.00)
LDL Cholesterol: 101 mg/dL — ABNORMAL HIGH (ref 0–99)
NonHDL: 113.55
Total CHOL/HDL Ratio: 3
Triglycerides: 63 mg/dL (ref 0.0–149.0)
VLDL: 12.6 mg/dL (ref 0.0–40.0)

## 2020-09-13 LAB — CBC WITH DIFFERENTIAL/PLATELET
Basophils Absolute: 0 10*3/uL (ref 0.0–0.1)
Basophils Relative: 0.8 % (ref 0.0–3.0)
Eosinophils Absolute: 0.1 10*3/uL (ref 0.0–0.7)
Eosinophils Relative: 2.8 % (ref 0.0–5.0)
HCT: 40.8 % (ref 36.0–46.0)
Hemoglobin: 13.8 g/dL (ref 12.0–15.0)
Lymphocytes Relative: 13.4 % (ref 12.0–46.0)
Lymphs Abs: 0.5 10*3/uL — ABNORMAL LOW (ref 0.7–4.0)
MCHC: 33.8 g/dL (ref 30.0–36.0)
MCV: 95.1 fl (ref 78.0–100.0)
Monocytes Absolute: 0.4 10*3/uL (ref 0.1–1.0)
Monocytes Relative: 9.9 % (ref 3.0–12.0)
Neutro Abs: 2.8 10*3/uL (ref 1.4–7.7)
Neutrophils Relative %: 73.1 % (ref 43.0–77.0)
Platelets: 184 10*3/uL (ref 150.0–400.0)
RBC: 4.29 Mil/uL (ref 3.87–5.11)
RDW: 13.9 % (ref 11.5–15.5)
WBC: 3.8 10*3/uL — ABNORMAL LOW (ref 4.0–10.5)

## 2020-09-13 LAB — HEMOGLOBIN A1C: Hgb A1c MFr Bld: 6.7 % — ABNORMAL HIGH (ref 4.6–6.5)

## 2020-09-13 MED ORDER — MOMETASONE FUROATE 0.1 % EX CREA: TOPICAL_CREAM | CUTANEOUS | 1 refills | Status: DC

## 2020-09-13 NOTE — Progress Notes (Signed)
Subjective:  Patient ID: Leah Holmes, female    DOB: 06/13/1927  Age: 86 y.o. MRN: [id number]  CC: The primary encounter diagnosis was DM type 2 with diabetic peripheral neuropathy ([place]). Diagnoses of Invasive ductal carcinoma of breast, female, left ([place]), Acquired thrombophilia ([place]), Essential hypertension, benign, Sick sinus syndrome ([place]), Aortic atherosclerosis ([place]), Myalgia due to statin, and Hyperlipidemia associated with type 2 diabetes mellitus ([place]) were also pertinent to this visit.  HPI Leah Holmes presents for follow up on type 2 diabetes,  Aortic atherosclerosis, atrial fibrillation, chronic ,SSS s/p pacemaker and  anticoagulation with warfarin, hypertension, invasive breast cancer  and untreated anxiety.   This visit occurred during the SARS-CoV-2 public health emergency.  Safety protocols were in place, including screening questions prior to the visit, additional usage of staff PPE, and extensive cleaning of exam room while observing appropriate contact time as indicated for disinfecting solutions.   Since her last visit she has completed radiation therapy for left breast  Carcinoma , in December. Has an occasional discomfort in the area of radiation but the symptoms are transient and occur  without erythema     Some joint pain  :  Neck shoulder, knees  and thumb joints.  Using rapid relief tylenol.  Sleeping ok "some nights"  Occasional foot pain described as burning which is chronic   DM:  No longer checking sugars per my instructions,  As she was checking excessively , which was aggravating her anxiety.  No episodes suggestive of hypoglycemia    Outpatient Medications Prior to Visit  Medication Sig Dispense Refill  . albuterol (PROAIR HFA) 108 (90 Base) MCG/ACT inhaler Inhale 1-2 puffs into the lungs every 4 (four) hours as needed for wheezing or shortness of breath. 1 Inhaler 5  . amLODipine (NORVASC) 5 MG tablet TAKE ONE TABLET (5 mg) BY MOUTH  every morning 90 tablet 3  . atenolol (TENORMIN) 50 MG tablet Take 1 tablet by mouth twice daily 180 tablet 0  . Cholecalciferol (VITAMIN D3) 1000 UNITS CAPS Take 1,000 Units by mouth every morning.     Marland Kitchen glucose blood (ONE TOUCH ULTRA TEST) test strip USE 1 STRIP TO CHECK GLUCOSE TWICE DAILY  Dx code: E11.9 200 each 3  . glucose blood (ONE TOUCH ULTRA TEST) test strip USE ONE STRIP TO CHECK GLUCOSE ONCE  DAILY 100 each 3  . hydrochlorothiazide (HYDRODIURIL) 25 MG tablet Take 25 mg by mouth daily.     Marland Kitchen ipratropium (ATROVENT) 0.03 % nasal spray Place 2 sprays into both nostrils every 12 (twelve) hours. 30 mL 12  . isosorbide mononitrate (IMDUR) 60 MG 24 hr tablet Take 60 mg by mouth 2 (two) times daily.    . Lancets (ONETOUCH DELICA PLUS [id number]) MISC Inject 2 Devices into the skin daily. DX Code E11.9. 200 each 2  . lisinopril (ZESTRIL) 20 MG tablet Take 2 tablets by mouth twice daily 180 tablet 0  . meclizine (ANTIVERT) 25 MG tablet Take 1 tablet by mouth three times daily as needed 90 tablet 0  . nitroGLYCERIN (NITROSTAT) 0.4 MG SL tablet Place 0.4 mg under the tongue every 5 (five) minutes as needed for chest pain.    Marland Kitchen omeprazole (PRILOSEC) 20 MG capsule Take 1 capsule by mouth once daily 90 capsule 0  . ONETOUCH ULTRA test strip USE 1 STRIP TO CHECK GLUCOSE TWICE DAILY 100 each 0  . Polyethyl Glycol-Propyl Glycol 0.4-0.3 % SOLN Place 1 drop into both eyes 3 (three) times daily  as needed (for dry/irritated eyes.).    Marland Kitchen psyllium (METAMUCIL) 58.6 % packet Take 1 packet by mouth daily as needed (for regularity).     . triamcinolone cream (KENALOG) 0.1 % APPLY 1 APPLICATION TOPICALLY TWICE DAILY 30 g 0  . warfarin (COUMADIN) 2 MG tablet Take 4 mg by mouth at bedtime.     Marland Kitchen warfarin (COUMADIN) 4 MG tablet Take 4 mg by mouth daily at 6 PM.      No facility-administered medications prior to visit.    Review of Systems;  Patient denies headache, fevers, malaise, unintentional weight loss,  skin rash, eye pain, sinus congestion and sinus pain, sore throat, dysphagia,  hemoptysis , cough, dyspnea, wheezing, chest pain, palpitations, orthopnea, edema, abdominal pain, nausea, melena, diarrhea, constipation, flank pain, dysuria, hematuria, urinary  Frequency, nocturia, numbness, tingling, seizures,  Focal weakness, Loss of consciousness,  Tremor, insomnia, depression, anxiety, and suicidal ideation.      Objective:  BP (!) 156/78 (BP Location: Left Arm, Patient Position: Sitting, Cuff Size: Normal)   Pulse 68   Temp 97.8 F (36.6 C) (Oral)   Resp 16   Ht 5\' 6"  (1.676 m)   Wt 132 lb 3.2 oz (60 kg)   SpO2 98%   BMI 21.34 kg/m   BP Readings from Last 3 Encounters:  09/13/20 (!) 156/78  06/28/20 125/63  02/23/20 (!) 176/54    Wt Readings from Last 3 Encounters:  09/13/20 132 lb 3.2 oz (60 kg)  06/28/20 130 lb 6.4 oz (59.1 kg)  04/09/20 132 lb (59.9 kg)    General appearance: alert, cooperative and appears stated age Ears: normal TM's and external ear canals both ears Throat: lips, mucosa, and tongue normal; teeth and gums normal Neck: no adenopathy, no carotid bruit, supple, symmetrical, trachea midline and thyroid not enlarged, symmetric, no tenderness/mass/nodules Back: symmetric, no curvature. ROM normal. No CVA tenderness. Lungs: clear to auscultation bilaterally Heart: regular rate and rhythm, S1, S2 normal, no murmur, click, rub or gallop Abdomen: soft, non-tender; bowel sounds normal; no masses,  no organomegaly Pulses: 2+ and symmetric Skin: Skin color, texture, turgor normal. No rashes or lesions Lymph nodes: Cervical, supraclavicular, and axillary nodes normal.  Lab Results  Component Value Date   HGBA1C 6.7 (H) 09/13/2020   HGBA1C 6.8 (H) 02/23/2020   HGBA1C 7.0 (H) 09/06/2019    Lab Results  Component Value Date   CREATININE 0.64 09/13/2020   CREATININE 0.61 02/23/2020   CREATININE 0.72 09/06/2019    Lab Results  Component Value Date   WBC 3.8  (L) 09/13/2020   HGB 13.8 09/13/2020   HCT 40.8 09/13/2020   PLT 184.0 09/13/2020   GLUCOSE 151 (H) 09/13/2020   CHOL 174 09/13/2020   TRIG 63.0 09/13/2020   HDL 60.10 09/13/2020   LDLDIRECT 116.0 10/30/2015   LDLCALC 101 (H) 09/13/2020   ALT 14 09/13/2020   AST 19 09/13/2020   NA 136 09/13/2020   K 3.5 09/13/2020   CL 98 09/13/2020   CREATININE 0.64 09/13/2020   BUN 9 09/13/2020   CO2 32 09/13/2020   TSH 0.59 02/23/2020   INR 2.1 (H) 12/12/2018   HGBA1C 6.7 (H) 09/13/2020   MICROALBUR 1.0 02/23/2020    No results found.  Assessment & Plan:   Problem List Items Addressed This Visit      Unprioritized   Invasive ductal carcinoma of breast, female, left ([place])    Treated with lumpectomy in 2020.  Did not tolerate adjuvant therapy. Sent back  to dr. Bary Castilla for evaluation of thickening of skin noted at prior surgical site.  Completed XRT with DR Chrystal recently.       Acquired thrombophilia ([place])    Secondary to chronic atrial fibrillation.  Her embolic stroke risk is managed with use of coumadin, INR is managed by Coumad[facility] clinic at [facility].       Relevant Orders   CBC with Differential/Platelet (Completed)   DM type 2 with diabetic peripheral neuropathy ([place]) - Primary    Continues to be well managed without glipizide .  BS have been controlled. She declines medication therapy for neuropathy   Lab Results  Component Value Date   HGBA1C 6.7 (H) 09/13/2020         Relevant Orders   Hemoglobin A1c (Completed)   Comprehensive metabolic panel (Completed)   Lipid panel (Completed)   Hyperlipidemia associated with type 2 diabetes mellitus ([place])    Untreated  due to intolerance to prior trials of generic Lipitor and generic crestor .  myoview was done in 2019 by Dr Erin Fulling due ot her known CAD and was negative for inducible ischemia   Lab Results  Component Value Date   CHOL 174 09/13/2020   HDL 60.10 09/13/2020   LDLCALC 101 (H) 09/13/2020   LDLDIRECT 116.0  10/30/2015   TRIG 63.0 09/13/2020   CHOLHDL 3 09/13/2020   Lab Results  Component Value Date   ALT 14 09/13/2020   AST 19 09/13/2020   ALKPHOS 63 09/13/2020   BILITOT 1.0 09/13/2020         Essential hypertension, benign    Well controlled on current regimen of amlodipine , hctz,  Imdur, atenolol, and lisinopril . Renal function stable, no changes today.  Lab Results  Component Value Date   CREATININE 0.64 09/13/2020   Lab Results  Component Value Date   NA 136 09/13/2020   K 3.5 09/13/2020   CL 98 09/13/2020   CO2 32 09/13/2020         Sick sinus syndrome ([facility])    S/p pacemaker implantation in 2018.  She is in sinus rhythm today on exam and remains asymptomatic.  Follow up semi annually with Dr [name]       Aortic atherosclerosis [facility])    Reviewed findings of prior CT scan today..  Patient is intolerant of statins due to multiple prior trials which caused myalgias and increased her risk of falling      Myalgia due to statin      I am having Leah L. Current start on mometasone. I am also having her maintain her nitroGLYCERIN, psyllium, Vitamin D3, isosorbide mononitrate, hydrochlorothiazide, albuterol, Polyethyl Glycol-Propyl Glycol, warfarin, OneTouch Delica Plus [id number], OneTouch Ultra, glucose blood, glucose blood, amLODipine, warfarin, ipratropium, triamcinolone, omeprazole, meclizine, atenolol, and lisinopril.  Meds ordered this encounter  Medications  . mometasone (ELOCON) 0.1 % cream    Sig: Apply once ot twice daily as needed for itching    Dispense:  15 g    Refill:  1    KEEP ON FILE FOR FUTURE REFILLS    There are no discontinued medications.  Follow-up: No follow-ups on file.   Crecencio Mc, MD

## 2020-09-13 NOTE — Patient Instructions (Signed)
I have prescrbied  A steroid cream to use as needed for itchy earlobes/ears

## 2020-09-13 NOTE — Assessment & Plan Note (Signed)
Treated with lumpectomy in 2020.  Did not tolerate adjuvant therapy. Sent back to dr. Bary Castilla for evaluation of thickening of skin noted at prior surgical site.  Completed XRT with DR Chrystal recently.

## 2020-09-15 ENCOUNTER — Encounter: Payer: Self-pay | Admitting: Internal Medicine

## 2020-09-15 DIAGNOSIS — M791 Myalgia, unspecified site: Secondary | ICD-10-CM | POA: Insufficient documentation

## 2020-09-15 DIAGNOSIS — T466X5A Adverse effect of antihyperlipidemic and antiarteriosclerotic drugs, initial encounter: Secondary | ICD-10-CM | POA: Insufficient documentation

## 2020-09-15 NOTE — Assessment & Plan Note (Addendum)
Untreated  due to intolerance to prior trials of generic Lipitor and generic crestor .  myoview was done in 2019 by Dr Erin Fulling due ot her known CAD and was negative for inducible ischemia   Lab Results  Component Value Date   CHOL 174 09/13/2020   HDL 60.10 09/13/2020   LDLCALC 101 (H) 09/13/2020   LDLDIRECT 116.0 10/30/2015   TRIG 63.0 09/13/2020   CHOLHDL 3 09/13/2020   Lab Results  Component Value Date   ALT 14 09/13/2020   AST 19 09/13/2020   ALKPHOS 63 09/13/2020   BILITOT 1.0 09/13/2020

## 2020-09-15 NOTE — Assessment & Plan Note (Signed)
Reviewed findings of prior CT scan today..  Patient is intolerant of statins due to multiple prior trials which caused myalgias and increased her risk of falling

## 2020-09-15 NOTE — Assessment & Plan Note (Signed)
Continues to be well managed without glipizide .  BS have been controlled. She declines medication therapy for neuropathy   Lab Results  Component Value Date   HGBA1C 6.7 (H) 09/13/2020

## 2020-09-15 NOTE — Assessment & Plan Note (Signed)
Secondary to chronic atrial fibrillation.  Her embolic stroke risk is managed with use of coumadin, INR is managed by Coumadin clinic at [facility].

## 2020-09-15 NOTE — Assessment & Plan Note (Addendum)
Well controlled on current regimen of amlodipine , hctz,  Imdur, atenolol, and lisinopril . Renal function stable, no changes today.  Lab Results  Component Value Date   CREATININE 0.64 09/13/2020   Lab Results  Component Value Date   NA 136 09/13/2020   K 3.5 09/13/2020   CL 98 09/13/2020   CO2 32 09/13/2020

## 2020-09-15 NOTE — Assessment & Plan Note (Signed)
S/p pacemaker implantation in 2018.  She is in sinus rhythm today on exam and remains asymptomatic.  Follow up semi annually with Dr [name]

## 2020-09-16 DIAGNOSIS — I480 Paroxysmal atrial fibrillation: Secondary | ICD-10-CM | POA: Diagnosis not present

## 2020-09-23 ENCOUNTER — Other Ambulatory Visit: Payer: Self-pay | Admitting: Internal Medicine

## 2020-09-28 ENCOUNTER — Other Ambulatory Visit: Payer: Self-pay | Admitting: Internal Medicine

## 2020-10-16 DIAGNOSIS — Z7901 Long term (current) use of anticoagulants: Secondary | ICD-10-CM | POA: Diagnosis not present

## 2020-10-22 DIAGNOSIS — I495 Sick sinus syndrome: Secondary | ICD-10-CM | POA: Diagnosis not present

## 2020-11-08 ENCOUNTER — Ambulatory Visit: Payer: Medicare Other

## 2020-11-11 ENCOUNTER — Other Ambulatory Visit: Payer: Self-pay | Admitting: Internal Medicine

## 2020-11-19 DIAGNOSIS — Z7901 Long term (current) use of anticoagulants: Secondary | ICD-10-CM | POA: Diagnosis not present

## 2020-11-27 ENCOUNTER — Ambulatory Visit
Admission: RE | Admit: 2020-11-27 | Discharge: 2020-11-27 | Disposition: A | Discharge status: Home / Self Care | Payer: Medicare Other | Source: Ambulatory Visit | Attending: Radiation Oncology | Admitting: Radiation Oncology

## 2020-11-27 ENCOUNTER — Encounter: Payer: Self-pay | Admitting: Radiation Oncology

## 2020-11-27 ENCOUNTER — Other Ambulatory Visit: Payer: Self-pay

## 2020-11-27 VITALS — BP 174/77 | HR 60 | Temp 97.3°F | Resp 12 | Wt 129.5 lb

## 2020-11-27 DIAGNOSIS — Z08 Encounter for follow-up examination after completed treatment for malignant neoplasm: Secondary | ICD-10-CM | POA: Diagnosis not present

## 2020-11-27 DIAGNOSIS — Z993 Dependence on wheelchair: Secondary | ICD-10-CM | POA: Diagnosis not present

## 2020-11-27 DIAGNOSIS — Z923 Personal history of irradiation: Secondary | ICD-10-CM | POA: Diagnosis not present

## 2020-11-27 DIAGNOSIS — C50912 Malignant neoplasm of unspecified site of left female breast: Secondary | ICD-10-CM | POA: Diagnosis not present

## 2020-11-27 DIAGNOSIS — C7951 Secondary malignant neoplasm of bone: Secondary | ICD-10-CM | POA: Insufficient documentation

## 2020-11-27 DIAGNOSIS — Z170 Estrogen receptor positive status [ER+]: Secondary | ICD-10-CM | POA: Diagnosis not present

## 2020-11-27 DIAGNOSIS — Z853 Personal history of malignant neoplasm of breast: Secondary | ICD-10-CM | POA: Diagnosis not present

## 2020-11-27 NOTE — Progress Notes (Signed)
Radiation Oncology Follow up Note  Name: Leah Holmes   Date:   11/27/2020 MRN:  [id number] DOB: 1927-01-08    This 87 y.o. female presents to the clinic today for 25-month follow-up status post whole breast radiation to her left breast and patient status post wide local excision for T2 lesion in 2019 now with recurrent disease in the left breast status post resection.  REFERRING PROVIDER: Crecencio Mc, MD  HPI: Patient is a 87 year old female now out 6 months having completed whole breast radiation to her left breast for recurrent breast cancer status post whole breast radiation in 2019 for T2N0 invasive disease.  She is seen today in routine follow-up is doing well specifically denies breast tenderness cough or bone pain she is frail has multiple comorbidities and is wheelchair-bound.  COMPLICATIONS OF TREATMENT: none  FOLLOW UP COMPLIANCE: keeps appointments   PHYSICAL EXAM:  BP (!) 174/77   Pulse 60   Temp (!) 97.3 F (36.3 C) (Tympanic)   Resp 12   Wt 129 lb 8 oz (58.7 kg)   BMI 20.90 kg/m  Lungs are clear to A&P cardiac examination essentially unremarkable with regular rate and rhythm. No dominant mass or nodularity is noted in either breast in 2 positions examined. Incision is well-healed. No axillary or supraclavicular adenopathy is appreciated. Cosmetic result is excellent.  Frail wheelchair-bound female in NAD.  Well-developed well-nourished patient in NAD. HEENT reveals PERLA, EOMI, discs not visualized.  Oral cavity is clear. No oral mucosal lesions are identified. Neck is clear without evidence of cervical or supraclavicular adenopathy. Lungs are clear to A&P. Cardiac examination is essentially unremarkable with regular rate and rhythm without murmur rub or thrill. Abdomen is benign with no organomegaly or masses noted. Motor sensory and DTR levels are equal and symmetric in the upper and lower extremities. Cranial nerves II through XII are grossly intact.  Proprioception is intact. No peripheral adenopathy or edema is identified. No motor or sensory levels are noted. Crude visual fields are within normal range. RADIOLOGY RESULTS: No current films for review  PLAN: Present time patient is doing well with no evidence of disease now 6 months out from salvage treatment to her left breast.  And pleased with her overall progress.  I have asked her to contact Dr. Barbette Hair office for continuity of care.  He will be ordering follow-up mammograms.  I have asked to see her back in 6 months for follow-up.  She has multiple comorbidities that are being addressed by other clinicians.  Patient and family know to call with any concerns.  I would like to take this opportunity to thank you for allowing me to participate in the care of your patient.Noreene Filbert, MD

## 2020-12-10 DIAGNOSIS — U071 COVID-19: Secondary | ICD-10-CM | POA: Diagnosis not present

## 2020-12-10 DIAGNOSIS — I071 Rheumatic tricuspid insufficiency: Secondary | ICD-10-CM | POA: Diagnosis not present

## 2020-12-10 DIAGNOSIS — E782 Mixed hyperlipidemia: Secondary | ICD-10-CM | POA: Diagnosis not present

## 2020-12-10 DIAGNOSIS — I2510 Atherosclerotic heart disease of native coronary artery without angina pectoris: Secondary | ICD-10-CM | POA: Diagnosis not present

## 2020-12-10 DIAGNOSIS — I495 Sick sinus syndrome: Secondary | ICD-10-CM | POA: Diagnosis not present

## 2020-12-10 DIAGNOSIS — I272 Pulmonary hypertension, unspecified: Secondary | ICD-10-CM | POA: Diagnosis not present

## 2020-12-10 DIAGNOSIS — I482 Chronic atrial fibrillation, unspecified: Secondary | ICD-10-CM | POA: Diagnosis not present

## 2020-12-10 DIAGNOSIS — I10 Essential (primary) hypertension: Secondary | ICD-10-CM | POA: Diagnosis not present

## 2020-12-10 DIAGNOSIS — I340 Nonrheumatic mitral (valve) insufficiency: Secondary | ICD-10-CM | POA: Diagnosis not present

## 2020-12-10 DIAGNOSIS — I6523 Occlusion and stenosis of bilateral carotid arteries: Secondary | ICD-10-CM | POA: Diagnosis not present

## 2020-12-10 DIAGNOSIS — I517 Cardiomegaly: Secondary | ICD-10-CM | POA: Diagnosis not present

## 2020-12-21 ENCOUNTER — Other Ambulatory Visit: Payer: Self-pay | Admitting: Internal Medicine

## 2020-12-22 DIAGNOSIS — Z20822 Contact with and (suspected) exposure to covid-19: Secondary | ICD-10-CM | POA: Diagnosis not present

## 2020-12-23 DIAGNOSIS — Z7901 Long term (current) use of anticoagulants: Secondary | ICD-10-CM | POA: Diagnosis not present

## 2021-01-16 ENCOUNTER — Other Ambulatory Visit: Payer: Self-pay | Admitting: General Surgery

## 2021-01-16 DIAGNOSIS — Z1231 Encounter for screening mammogram for malignant neoplasm of breast: Secondary | ICD-10-CM

## 2021-01-16 DIAGNOSIS — C50912 Malignant neoplasm of unspecified site of left female breast: Secondary | ICD-10-CM

## 2021-01-16 DIAGNOSIS — Z853 Personal history of malignant neoplasm of breast: Secondary | ICD-10-CM | POA: Diagnosis not present

## 2021-01-22 DIAGNOSIS — Z7901 Long term (current) use of anticoagulants: Secondary | ICD-10-CM | POA: Diagnosis not present

## 2021-01-27 ENCOUNTER — Other Ambulatory Visit: Payer: Self-pay

## 2021-01-27 ENCOUNTER — Ambulatory Visit
Admission: RE | Admit: 2021-01-27 | Discharge: 2021-01-27 | Disposition: A | Discharge status: Home / Self Care | Payer: Medicare Other | Source: Ambulatory Visit | Attending: General Surgery | Admitting: General Surgery

## 2021-01-27 DIAGNOSIS — Z1231 Encounter for screening mammogram for malignant neoplasm of breast: Secondary | ICD-10-CM | POA: Insufficient documentation

## 2021-01-27 HISTORY — DX: Personal history of irradiation: Z92.3

## 2021-01-29 ENCOUNTER — Other Ambulatory Visit: Payer: Self-pay | Admitting: General Surgery

## 2021-01-29 DIAGNOSIS — R928 Other abnormal and inconclusive findings on diagnostic imaging of breast: Secondary | ICD-10-CM

## 2021-01-29 DIAGNOSIS — R921 Mammographic calcification found on diagnostic imaging of breast: Secondary | ICD-10-CM

## 2021-02-04 ENCOUNTER — Ambulatory Visit
Admission: RE | Admit: 2021-02-04 | Discharge: 2021-02-04 | Disposition: A | Discharge status: Home / Self Care | Payer: Medicare Other | Source: Ambulatory Visit | Attending: General Surgery | Admitting: General Surgery

## 2021-02-04 ENCOUNTER — Other Ambulatory Visit: Payer: Self-pay

## 2021-02-04 DIAGNOSIS — R922 Inconclusive mammogram: Secondary | ICD-10-CM | POA: Diagnosis not present

## 2021-02-04 DIAGNOSIS — R928 Other abnormal and inconclusive findings on diagnostic imaging of breast: Secondary | ICD-10-CM

## 2021-02-04 DIAGNOSIS — R921 Mammographic calcification found on diagnostic imaging of breast: Secondary | ICD-10-CM

## 2021-02-10 ENCOUNTER — Other Ambulatory Visit: Payer: Self-pay | Admitting: Internal Medicine

## 2021-02-24 DIAGNOSIS — Z7901 Long term (current) use of anticoagulants: Secondary | ICD-10-CM | POA: Diagnosis not present

## 2021-03-05 ENCOUNTER — Emergency Department
Admission: EM | Admit: 2021-03-05 | Discharge: 2021-03-05 | Disposition: A | Discharge status: Home / Self Care | Payer: Medicare Other | Attending: Emergency Medicine | Admitting: Emergency Medicine

## 2021-03-05 ENCOUNTER — Emergency Department: Payer: Medicare Other

## 2021-03-05 ENCOUNTER — Other Ambulatory Visit: Payer: Self-pay

## 2021-03-05 ENCOUNTER — Encounter: Payer: Self-pay | Admitting: Emergency Medicine

## 2021-03-05 DIAGNOSIS — Z79899 Other long term (current) drug therapy: Secondary | ICD-10-CM | POA: Diagnosis not present

## 2021-03-05 DIAGNOSIS — Z8616 Personal history of COVID-19: Secondary | ICD-10-CM | POA: Insufficient documentation

## 2021-03-05 DIAGNOSIS — I2510 Atherosclerotic heart disease of native coronary artery without angina pectoris: Secondary | ICD-10-CM | POA: Insufficient documentation

## 2021-03-05 DIAGNOSIS — I509 Heart failure, unspecified: Secondary | ICD-10-CM | POA: Diagnosis not present

## 2021-03-05 DIAGNOSIS — I4891 Unspecified atrial fibrillation: Secondary | ICD-10-CM | POA: Diagnosis not present

## 2021-03-05 DIAGNOSIS — E1140 Type 2 diabetes mellitus with diabetic neuropathy, unspecified: Secondary | ICD-10-CM | POA: Insufficient documentation

## 2021-03-05 DIAGNOSIS — I110 Hypertensive heart disease with heart failure: Secondary | ICD-10-CM | POA: Diagnosis not present

## 2021-03-05 DIAGNOSIS — M79605 Pain in left leg: Secondary | ICD-10-CM | POA: Diagnosis not present

## 2021-03-05 DIAGNOSIS — Z87891 Personal history of nicotine dependence: Secondary | ICD-10-CM | POA: Insufficient documentation

## 2021-03-05 DIAGNOSIS — Z7901 Long term (current) use of anticoagulants: Secondary | ICD-10-CM | POA: Insufficient documentation

## 2021-03-05 DIAGNOSIS — M5442 Lumbago with sciatica, left side: Secondary | ICD-10-CM | POA: Insufficient documentation

## 2021-03-05 DIAGNOSIS — Z950 Presence of cardiac pacemaker: Secondary | ICD-10-CM | POA: Insufficient documentation

## 2021-03-05 DIAGNOSIS — M160 Bilateral primary osteoarthritis of hip: Secondary | ICD-10-CM | POA: Diagnosis not present

## 2021-03-05 DIAGNOSIS — M25552 Pain in left hip: Secondary | ICD-10-CM | POA: Diagnosis not present

## 2021-03-05 DIAGNOSIS — M545 Low back pain, unspecified: Secondary | ICD-10-CM | POA: Diagnosis not present

## 2021-03-05 DIAGNOSIS — Z853 Personal history of malignant neoplasm of breast: Secondary | ICD-10-CM | POA: Diagnosis not present

## 2021-03-05 LAB — URINALYSIS, COMPLETE (UACMP) WITH MICROSCOPIC
Bilirubin Urine: NEGATIVE
Glucose, UA: 50 mg/dL — AB
Ketones, ur: NEGATIVE mg/dL
Leukocytes,Ua: NEGATIVE
Nitrite: NEGATIVE
Protein, ur: NEGATIVE mg/dL
Specific Gravity, Urine: 1.005 (ref 1.005–1.030)
pH: 6 (ref 5.0–8.0)

## 2021-03-05 MED ORDER — TRAMADOL HCL 50 MG PO TABS: 50.0000 mg | ORAL_TABLET | Freq: Two times a day (BID) | ORAL | 0 refills | Status: AC

## 2021-03-05 MED ORDER — LIDOCAINE 5 % EX PTCH: 1.0000 | MEDICATED_PATCH | Freq: Two times a day (BID) | CUTANEOUS | 0 refills | Status: AC | PRN

## 2021-03-05 MED ORDER — LIDOCAINE 5 % EX PTCH
1.0000 | MEDICATED_PATCH | Freq: Once | CUTANEOUS | Status: DC
  Administered 2021-03-05: 1 via TRANSDERMAL
  Filled 2021-03-05: qty 1

## 2021-03-05 MED ORDER — TRAMADOL HCL 50 MG PO TABS
25.0000 mg | ORAL_TABLET | Freq: Once | ORAL | Status: AC
Start: 2021-03-05 — End: 2021-03-05
  Administered 2021-03-05: 25 mg via ORAL
  Filled 2021-03-05: qty 1

## 2021-03-05 NOTE — ED Provider Notes (Signed)
Emergency Medicine Provider Triage Evaluation Note  Leah Holmes, a 87 y.o. female  was evaluated in triage.  Pt complains of lower back pain with LLE referral. She denies recent fall, dysuria, foot drop, or incontinence.   Review of Systems  Positive: LBP w/ LLE referral  Negative: CP, abd pain  Physical Exam  BP (!) 189/89 (BP Location: Left Arm)   Pulse 60   Temp 98.4 F (36.9 C) (Oral)   Resp 16   Ht 5\' 6"  (1.676 m)   Wt 57.6 kg   SpO2 98%   BMI 20.50 kg/m  Gen:   Awake, no distress  A&O x 4 Resp:  Normal effort CTA MSK:   Moves extremities without difficulty. No edema Other:  CVS: RRR  Medical Decision Making  Medically screening exam initiated at 8:06 PM.  Appropriate orders placed.  Leah Holmes was informed that the remainder of the evaluation will be completed by another provider, this initial triage assessment does not replace that evaluation, and the importance of remaining in the ED until their evaluation is complete.  Patient with ED evaluation of LBP w/ LLE referral.   Melvenia Needles, PA-C 03/05/21 2009    Rada Hay, MD 03/08/21 1310

## 2021-03-05 NOTE — ED Triage Notes (Signed)
Pt to ED from home c/o lower back pain and left hip pain, denies known injury.  States fell last week and was not seen then but denies injury.  Denies urinary changes.  Pt A&Ox4, chest rise even and unlabored, in NAD at this time.

## 2021-03-05 NOTE — ED Provider Notes (Signed)
[facility] Emergency Department Provider Note ____________________________________________  Time seen: 2127  I have reviewed the triage vital signs and the nursing notes.  HISTORY  Chief Complaint  Back Pain   HPI Leah Holmes is a 87 y.o. female with the below medical history, presents to the ED accompanied by her daughter, for evaluation of acute midline low back pain.  Patient denies any recent injury, trauma, or falls.  She also denies any bladder or bowel incontinence, foot drop, or saddle anesthesia.  She does note some mild left leg referred pain, but denies any persistence.  She denies a history of chronic ongoing back pain.  Past Medical History:  Diagnosis Date   Atrial fibrillation ([place]) 2012   Breast cancer ([place]) 04/2018   IDC of left breast    CHF (congestive heart failure) ([place])    Colon polyp 2003   Coronary artery disease    COVID-19 virus infection 12/25/2019   Diabetes mellitus without complication (HCC)    Dyspnea    Dysrhythmia    atrial fib   Environmental and seasonal allergies    GERD (gastroesophageal reflux disease)    Hyperlipidemia    Hypertension    Irritable bowel syndrome    Multiple rib fractures 03/15/2018   Personal history of radiation therapy    Polyp of colon    PONV (postoperative nausea and vomiting)    with ether, not recently "woke up in surgery once"   Presence of permanent cardiac pacemaker    Tremor    Tremors of nervous system    Ulcer     Patient Active Problem List   Diagnosis Date Noted   Myalgia due to statin 09/15/2020   Acquired thrombophilia ([place]) 12/25/2019   Weight loss, unintentional 12/25/2019   Closed rib fracture 09/08/2019   S/P placement of cardiac pacemaker 09/08/2019   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 09/08/2019   Pruritus 09/08/2019   Invasive ductal carcinoma of breast, female, left ([place]) 04/26/2018   Bilateral carotid artery stenosis 04/20/2018   Aortic  atherosclerosis ([place]) 03/15/2018   Balance problem 03/15/2018   Personal history of fall, presenting hazards to health 03/15/2018   Mass of upper inner quadrant of left breast 03/14/2018   Fatigue 10/27/2017   Intention tremor 04/15/2017   Osteoporosis 03/21/2016   Sick sinus syndrome ([place]) 01/28/2016   Other malaise 11/01/2015   Major depressive disorder, recurrent episode ([place]) 10/30/2015   Constipation 07/27/2015   Generalized anxiety disorder 04/02/2015   Ischemic chest pain ([place]) 01/08/2015   Chronic atrial fibrillation ([place]) 08/16/2014   Medicare annual wellness visit, subsequent 05/23/2014   Hyperlipidemia, mixed 05/08/2014   Moderate mitral insufficiency 05/08/2014   Moderate tricuspid insufficiency 05/08/2014   Dry mouth 03/10/2014   Low serum vitamin D 02/16/2014   LVH (left ventricular hypertrophy) 11/23/2013   Anal polyp 09/20/2013   Personal history of colonic polyps 08/31/2013   CAD (coronary artery disease) 03/09/2013   DM type 2 with diabetic peripheral neuropathy ([place]) 03/09/2013   Hyperlipidemia associated with type 2 diabetes mellitus ([place]) 03/09/2013   Essential hypertension, benign 03/09/2013   Nonulcer dyspepsia 03/09/2013    Past Surgical History:  Procedure Laterality Date   ABDOMINAL ADHESION SURGERY     ABDOMINAL HYSTERECTOMY     APPENDECTOMY     BREAST BIOPSY Right late 80s/early 90s   benign   BREAST BIOPSY Left 03/31/2018   [place] bx done with Dr. Bary Castilla, invasive ductal carcinoma   BREAST LUMPECTOMY Left 04/22/2018  Procedure: BREAST LUMPECTOMY;  Surgeon: Robert Bellow, MD;  Location: [facility] ORS;  Service: General;  Laterality: Left;   BREAST LUMPECTOMY Left 04/22/2018   [place], negative LN, clear margins   BREAST LUMPECTOMY Left 2020   positive, done in office   BREAST SURGERY Right    biopsy    CESAREAN SECTION     x 2   CHOLECYSTECTOMY  03/2012   Dr Burt Knack   COLONOSCOPY  2003   Dr Theodosia Paling in [place]   fibroid tumor     [place] N/A 01/28/2016   Procedure: INSERTION PACEMAKER;  Surgeon: Isaias Cowman, MD;  Location: [facility] ORS;  Service: Cardiovascular;  Laterality: N/A;   SENTINEL NODE BIOPSY Left 04/22/2018   Procedure: SENTINEL NODE BIOPSY;  Surgeon: Robert Bellow, MD;  Location: [facility] ORS;  Service: General;  Laterality: Left;   UPPER GI ENDOSCOPY  2003   Dr Theodosia Paling in [place]    Prior to Admission medications   Medication Sig Start Date End Date Taking? Authorizing Provider  lidocaine (LIDODERM) 5 % Place 1 patch onto the skin every 12 (twelve) hours as needed for up to 10 days. Remove & Discard patch after 12 hours of wear each day. 03/05/21 03/15/21 Yes Leiah Giannotti, Dannielle Karvonen, PA-C  traMADol (ULTRAM) 50 MG tablet Take 1 tablet (50 mg total) by mouth 2 (two) times daily for 5 days. 03/05/21 03/10/21 Yes Laquesha Holcomb, Dannielle Karvonen, PA-C  albuterol (PROAIR HFA) 108 (90 Base) MCG/ACT inhaler Inhale 1-2 puffs into the lungs every 4 (four) hours as needed for wheezing or shortness of breath. 11/17/17   Laverle Hobby, MD  amLODipine (NORVASC) 5 MG tablet TAKE ONE TABLET (5 mg) BY MOUTH every morning 11/22/19   Crecencio Mc, MD  atenolol (TENORMIN) 50 MG tablet Take 1 tablet by mouth twice daily 12/23/20   Crecencio Mc, MD  Cholecalciferol (VITAMIN D3) 1000 UNITS CAPS Take 1,000 Units by mouth every morning.     [provider]  glucose blood (ONE TOUCH ULTRA TEST) test strip USE 1 STRIP TO CHECK GLUCOSE TWICE DAILY  Dx code: E11.9 05/29/19   Crecencio Mc, MD  glucose blood (ONE TOUCH ULTRA TEST) test strip USE ONE STRIP TO CHECK GLUCOSE ONCE  DAILY 06/02/19   Crecencio Mc, MD  hydrochlorothiazide (HYDRODIURIL) 25 MG tablet Take 25 mg by mouth daily.  12/12/15   [provider]  ipratropium (ATROVENT) 0.03 % nasal spray Place 2 sprays into both nostrils every 12 (twelve) hours. 02/23/20   Crecencio Mc, MD  isosorbide mononitrate (IMDUR) 60 MG 24 hr tablet Take 60 mg by mouth 2  (two) times daily. 12/27/15   [provider]  Lancets (ONETOUCH DELICA PLUS YTKZSW10X) [place] 2 Devices into the skin daily. DX Code E11.9. 01/30/19   Crecencio Mc, MD  lisinopril (ZESTRIL) 20 MG tablet Take 2 tablets by mouth twice daily 02/11/21   Crecencio Mc, MD  meclizine (ANTIVERT) 25 MG tablet Take 1 tablet by mouth three times daily as needed 06/24/20   Crecencio Mc, MD  mometasone (ELOCON) 0.1 % cream Apply once ot twice daily as needed for itching 09/13/20   Crecencio Mc, MD  nitroGLYCERIN (NITROSTAT) 0.4 MG SL tablet Place 0.4 mg under the tongue every 5 (five) minutes as needed for chest pain.    [provider]  omeprazole (PRILOSEC) 20 MG capsule Take 1 capsule by mouth once daily 05/30/20   Crecencio Mc,  MD  ONETOUCH ULTRA test strip USE 1 STRIP TO CHECK GLUCOSE TWICE DAILY 05/15/19   Crecencio Mc, MD  Polyethyl Glycol-Propyl Glycol 0.4-0.3 % SOLN Place 1 drop into both eyes 3 (three) times daily as needed (for dry/irritated eyes.).    [provider]  psyllium (METAMUCIL) 58.6 % packet Take 1 packet by mouth daily as needed (for regularity).     [provider]  triamcinolone cream (KENALOG) 0.1 % APPLY 1 APPLICATION TOPICALLY TWICE DAILY 04/18/20   Crecencio Mc, MD  warfarin (COUMADIN) 2 MG tablet Take 4 mg by mouth at bedtime.  01/29/20   [provider]  warfarin (COUMADIN) 4 MG tablet Take 4 mg by mouth daily at 6 PM.     [provider]    Allergies Codeine, Penicillins, Amlodipine, Atorvastatin, Clonidine derivatives, Gabapentin, Losartan, Morphine, Morphine and related, Reglan [metoclopramide], and Hydralazine  Family History  Problem Relation Age of Onset   Breast cancer Sister    Cancer Sister 69       breast   Breast cancer Maternal Aunt 68   Breast cancer Cousin    Breast cancer Other 85    Social History Social History   Tobacco Use   Smoking status: Never   Smokeless tobacco: Former     Types: Snuff   Tobacco comments:    occasionally  Vaping Use   Vaping Use: Never used  Substance Use Topics   Alcohol use: No   Drug use: No    Review of Systems  Constitutional: Negative for fever. Cardiovascular: Negative for chest pain. Respiratory: Negative for shortness of breath. Gastrointestinal: Negative for abdominal pain, vomiting and diarrhea. Genitourinary: Negative for dysuria. Musculoskeletal: Positive for back pain. Skin: Negative for rash. Neurological: Negative for headaches, focal weakness or numbness. ____________________________________________  PHYSICAL EXAM:  VITAL SIGNS: ED Triage Vitals  Enc Vitals Group     BP 03/05/21 1954 (!) 189/89     Pulse Rate 03/05/21 1954 60     Resp 03/05/21 1954 16     Temp 03/05/21 1954 98.4 F (36.9 C)     Temp Source 03/05/21 1954 Oral     SpO2 03/05/21 1954 98 %     Weight 03/05/21 1955 127 lb (57.6 kg)     Height 03/05/21 1955 5\' 6"  (1.676 m)     Head Circumference --      Peak Flow --      Pain Score 03/05/21 1955 10     Pain Loc --      Pain Edu? --      Excl. in [place]? --     Constitutional: Alert and oriented. Well appearing and in no distress. Head: Normocephalic and atraumatic. Eyes: Conjunctivae are normal. Normal extraocular movements Mouth/Throat: Mucous membranes are moist. Neck: Supple. No thyromegaly. Cardiovascular: Normal rate, regular rhythm. Normal distal pulses. Respiratory: Normal respiratory effort. No wheezes/rales/rhonchi. Gastrointestinal: Soft and nontender. No distention. Musculoskeletal: Nontender with normal range of motion in all extremities.  Neurologic:  Normal gait without ataxia. Normal speech and language. No gross focal neurologic deficits are appreciated. Skin:  Skin is warm, dry and intact. No rash noted. Psychiatric: Mood and affect are normal. Patient exhibits appropriate insight and judgment. ____________________________________________    {LABS (pertinent  positives/negatives)  ____________________________________________  {EKG  ____________________________________________   RADIOLOGY Official radiology report(s): DG Lumbar Spine 2-3 Views  Result Date: 03/05/2021 CLINICAL DATA:  [place].  Back pain. EXAM: LUMBAR SPINE - 2-3 VIEW COMPARISON:  None. FINDINGS: Normal  alignment of the lumbar vertebral bodies. Remarkably little degenerative changes for age. Disc spaces and vertebral bodies are maintained. No acute compression fracture. No pars defects. The visualized bony pelvis is intact. Advanced aortic and iliac artery calcifications but no definite aneurysm. IMPRESSION: 1. Normal alignment and no acute bony findings. 2. Advanced aortic and iliac artery calcifications. Electronically Signed   By: Marijo Sanes M.D.   On: 03/05/2021 20:36   DG Hip Unilat With Pelvis 2-3 Views Left  Result Date: 03/05/2021 CLINICAL DATA:  [place].  Left hip pain. EXAM: DG HIP (WITH OR WITHOUT PELVIS) 2-3V LEFT COMPARISON:  None. FINDINGS: Both hips are normally located. No acute hip fracture is identified. Mild bilateral hip joint degenerative changes. The pubic symphysis and SI joints are intact. No pelvic fractures bone lesions. Advanced vascular calcifications. IMPRESSION: No acute bony findings. Electronically Signed   By: Marijo Sanes M.D.   On: 03/05/2021 20:37   ____________________________________________  PROCEDURES  Lidoderm Patch 5% topically  Procedures ____________________________________________   INITIAL IMPRESSION / ASSESSMENT AND PLAN / ED COURSE  As part of my medical decision making, I reviewed the following data within the [place] reviewed WNL, Notes from prior ED visits, and [place] Controlled Substance Database   Geriatric patient ED evaluation of acute midline low back pain without preceding injury or fall.  Patient presents with acute pain to the mid back without significant referral.  Patient is evaluated in the  ED for complaint, found to have reassuring negative x-rays of the lumbar spine and left hip.  Patient is concerned about taking any narcotic pain medicines given some previous history of some euphoria.  She is obliged to take Lidoderm patch, and a prescription for Lidoderm patch along with Ultram was provided for her benefit.  She will follow with primary provider for ongoing symptoms or return to the ED if needed.  Leah Holmes was evaluated in Emergency Department on 03/05/2021 for the symptoms described in the history of present illness. She was evaluated in the context of the global COVID-19 pandemic, which necessitated consideration that the patient might be at risk for infection with the SARS-CoV-2 virus that causes COVID-19. Institutional protocols and algorithms that pertain to the evaluation of patients at risk for COVID-19 are in a state of rapid change based on information released by regulatory bodies including the CDC and federal and state organizations. These policies and algorithms were followed during the patient's care in the ED.  I reviewed the patient's prescription history over the last 12 months in the multi-state controlled substances database(s) that includes [place], [place], [place], [place], [place], [place], [place], [place], [place], [place], [place], [place], [place], and [place].  Results were notable for no current RX.  ____________________________________________  FINAL CLINICAL IMPRESSION(S) / ED DIAGNOSES  Final diagnoses:  Acute midline low back pain with left-sided sciatica      Jovaun Levene, Dannielle Karvonen, PA-C 03/05/21 2208    Vladimir Crofts, MD 03/05/21 2358

## 2021-03-10 ENCOUNTER — Other Ambulatory Visit: Payer: Self-pay

## 2021-03-10 ENCOUNTER — Ambulatory Visit (INDEPENDENT_AMBULATORY_CARE_PROVIDER_SITE_OTHER): Payer: Medicare Other | Admitting: Family

## 2021-03-10 ENCOUNTER — Emergency Department: Payer: Medicare Other

## 2021-03-10 ENCOUNTER — Inpatient Hospital Stay
Admission: EM | Admit: 2021-03-10 | Discharge: 2021-03-14 | DRG: 305 | Disposition: A | Discharge status: Skilled Nursing Facility | Payer: Medicare Other | Attending: Internal Medicine | Admitting: Internal Medicine

## 2021-03-10 ENCOUNTER — Encounter: Payer: Self-pay | Admitting: Family

## 2021-03-10 VITALS — BP 182/82 | HR 60 | Temp 98.4°F

## 2021-03-10 DIAGNOSIS — Z66 Do not resuscitate: Secondary | ICD-10-CM | POA: Diagnosis present

## 2021-03-10 DIAGNOSIS — Z950 Presence of cardiac pacemaker: Secondary | ICD-10-CM | POA: Diagnosis present

## 2021-03-10 DIAGNOSIS — E782 Mixed hyperlipidemia: Secondary | ICD-10-CM | POA: Diagnosis present

## 2021-03-10 DIAGNOSIS — R109 Unspecified abdominal pain: Secondary | ICD-10-CM | POA: Diagnosis not present

## 2021-03-10 DIAGNOSIS — L299 Pruritus, unspecified: Secondary | ICD-10-CM | POA: Diagnosis not present

## 2021-03-10 DIAGNOSIS — Z7901 Long term (current) use of anticoagulants: Secondary | ICD-10-CM | POA: Diagnosis not present

## 2021-03-10 DIAGNOSIS — K219 Gastro-esophageal reflux disease without esophagitis: Secondary | ICD-10-CM | POA: Diagnosis present

## 2021-03-10 DIAGNOSIS — F339 Major depressive disorder, recurrent, unspecified: Secondary | ICD-10-CM | POA: Diagnosis present

## 2021-03-10 DIAGNOSIS — R634 Abnormal weight loss: Secondary | ICD-10-CM | POA: Diagnosis not present

## 2021-03-10 DIAGNOSIS — S32020A Wedge compression fracture of second lumbar vertebra, initial encounter for closed fracture: Secondary | ICD-10-CM | POA: Diagnosis not present

## 2021-03-10 DIAGNOSIS — Z4789 Encounter for other orthopedic aftercare: Secondary | ICD-10-CM | POA: Diagnosis not present

## 2021-03-10 DIAGNOSIS — C50912 Malignant neoplasm of unspecified site of left female breast: Secondary | ICD-10-CM | POA: Diagnosis not present

## 2021-03-10 DIAGNOSIS — Z803 Family history of malignant neoplasm of breast: Secondary | ICD-10-CM | POA: Diagnosis not present

## 2021-03-10 DIAGNOSIS — I110 Hypertensive heart disease with heart failure: Secondary | ICD-10-CM | POA: Diagnosis present

## 2021-03-10 DIAGNOSIS — I2510 Atherosclerotic heart disease of native coronary artery without angina pectoris: Secondary | ICD-10-CM | POA: Diagnosis present

## 2021-03-10 DIAGNOSIS — R9431 Abnormal electrocardiogram [ECG] [EKG]: Secondary | ICD-10-CM | POA: Diagnosis not present

## 2021-03-10 DIAGNOSIS — S32020S Wedge compression fracture of second lumbar vertebra, sequela: Secondary | ICD-10-CM | POA: Diagnosis not present

## 2021-03-10 DIAGNOSIS — B370 Candidal stomatitis: Secondary | ICD-10-CM

## 2021-03-10 DIAGNOSIS — F411 Generalized anxiety disorder: Secondary | ICD-10-CM | POA: Diagnosis present

## 2021-03-10 DIAGNOSIS — E876 Hypokalemia: Secondary | ICD-10-CM | POA: Diagnosis not present

## 2021-03-10 DIAGNOSIS — H04129 Dry eye syndrome of unspecified lacrimal gland: Secondary | ICD-10-CM | POA: Diagnosis not present

## 2021-03-10 DIAGNOSIS — K5900 Constipation, unspecified: Secondary | ICD-10-CM | POA: Diagnosis present

## 2021-03-10 DIAGNOSIS — R2681 Unsteadiness on feet: Secondary | ICD-10-CM | POA: Diagnosis not present

## 2021-03-10 DIAGNOSIS — Z87891 Personal history of nicotine dependence: Secondary | ICD-10-CM

## 2021-03-10 DIAGNOSIS — K589 Irritable bowel syndrome without diarrhea: Secondary | ICD-10-CM | POA: Diagnosis present

## 2021-03-10 DIAGNOSIS — I482 Chronic atrial fibrillation, unspecified: Secondary | ICD-10-CM | POA: Diagnosis present

## 2021-03-10 DIAGNOSIS — Z7401 Bed confinement status: Secondary | ICD-10-CM | POA: Diagnosis not present

## 2021-03-10 DIAGNOSIS — M6259 Muscle wasting and atrophy, not elsewhere classified, multiple sites: Secondary | ICD-10-CM | POA: Diagnosis not present

## 2021-03-10 DIAGNOSIS — I495 Sick sinus syndrome: Secondary | ICD-10-CM | POA: Diagnosis present

## 2021-03-10 DIAGNOSIS — I509 Heart failure, unspecified: Secondary | ICD-10-CM | POA: Diagnosis not present

## 2021-03-10 DIAGNOSIS — I160 Hypertensive urgency: Secondary | ICD-10-CM

## 2021-03-10 DIAGNOSIS — Z923 Personal history of irradiation: Secondary | ICD-10-CM | POA: Diagnosis not present

## 2021-03-10 DIAGNOSIS — Z853 Personal history of malignant neoplasm of breast: Secondary | ICD-10-CM | POA: Diagnosis not present

## 2021-03-10 DIAGNOSIS — E1169 Type 2 diabetes mellitus with other specified complication: Secondary | ICD-10-CM | POA: Diagnosis present

## 2021-03-10 DIAGNOSIS — W19XXXD Unspecified fall, subsequent encounter: Secondary | ICD-10-CM | POA: Diagnosis not present

## 2021-03-10 DIAGNOSIS — M4856XA Collapsed vertebra, not elsewhere classified, lumbar region, initial encounter for fracture: Secondary | ICD-10-CM | POA: Diagnosis present

## 2021-03-10 DIAGNOSIS — E559 Vitamin D deficiency, unspecified: Secondary | ICD-10-CM | POA: Diagnosis not present

## 2021-03-10 DIAGNOSIS — M810 Age-related osteoporosis without current pathological fracture: Secondary | ICD-10-CM | POA: Diagnosis present

## 2021-03-10 DIAGNOSIS — R1319 Other dysphagia: Secondary | ICD-10-CM | POA: Diagnosis not present

## 2021-03-10 DIAGNOSIS — I10 Essential (primary) hypertension: Secondary | ICD-10-CM | POA: Diagnosis not present

## 2021-03-10 DIAGNOSIS — Z20822 Contact with and (suspected) exposure to covid-19: Secondary | ICD-10-CM | POA: Diagnosis present

## 2021-03-10 DIAGNOSIS — M549 Dorsalgia, unspecified: Secondary | ICD-10-CM | POA: Diagnosis not present

## 2021-03-10 DIAGNOSIS — G8929 Other chronic pain: Secondary | ICD-10-CM | POA: Diagnosis present

## 2021-03-10 DIAGNOSIS — E1142 Type 2 diabetes mellitus with diabetic polyneuropathy: Secondary | ICD-10-CM | POA: Diagnosis present

## 2021-03-10 DIAGNOSIS — Z888 Allergy status to other drugs, medicaments and biological substances status: Secondary | ICD-10-CM

## 2021-03-10 DIAGNOSIS — I480 Paroxysmal atrial fibrillation: Secondary | ICD-10-CM | POA: Diagnosis not present

## 2021-03-10 DIAGNOSIS — R5383 Other fatigue: Secondary | ICD-10-CM

## 2021-03-10 DIAGNOSIS — Z880 Allergy status to penicillin: Secondary | ICD-10-CM

## 2021-03-10 DIAGNOSIS — I517 Cardiomegaly: Secondary | ICD-10-CM | POA: Diagnosis present

## 2021-03-10 DIAGNOSIS — R531 Weakness: Secondary | ICD-10-CM | POA: Diagnosis not present

## 2021-03-10 DIAGNOSIS — Z8719 Personal history of other diseases of the digestive system: Secondary | ICD-10-CM | POA: Diagnosis not present

## 2021-03-10 DIAGNOSIS — U071 COVID-19: Secondary | ICD-10-CM | POA: Diagnosis not present

## 2021-03-10 DIAGNOSIS — Z8616 Personal history of COVID-19: Secondary | ICD-10-CM | POA: Diagnosis not present

## 2021-03-10 DIAGNOSIS — J3089 Other allergic rhinitis: Secondary | ICD-10-CM | POA: Diagnosis not present

## 2021-03-10 DIAGNOSIS — S32020D Wedge compression fracture of second lumbar vertebra, subsequent encounter for fracture with routine healing: Secondary | ICD-10-CM | POA: Diagnosis not present

## 2021-03-10 DIAGNOSIS — R5381 Other malaise: Secondary | ICD-10-CM | POA: Diagnosis not present

## 2021-03-10 DIAGNOSIS — Z885 Allergy status to narcotic agent status: Secondary | ICD-10-CM

## 2021-03-10 HISTORY — DX: Hypertensive urgency: I16.0

## 2021-03-10 LAB — COMPREHENSIVE METABOLIC PANEL
ALT: 21 U/L (ref 0–44)
AST: 27 U/L (ref 15–41)
Albumin: 3.7 g/dL (ref 3.5–5.0)
Alkaline Phosphatase: 68 U/L (ref 38–126)
Anion gap: 10 (ref 5–15)
BUN: 12 mg/dL (ref 8–23)
CO2: 26 mmol/L (ref 22–32)
Calcium: 9.3 mg/dL (ref 8.9–10.3)
Chloride: 98 mmol/L (ref 98–111)
Creatinine, Ser: 0.58 mg/dL (ref 0.44–1.00)
GFR, Estimated: >60 mL/min (ref >60)
Glucose, Bld: 190 mg/dL — ABNORMAL HIGH (ref 70–99)
Potassium: 3.6 mmol/L (ref 3.5–5.1)
Sodium: 134 mmol/L — ABNORMAL LOW (ref 135–145)
Total Bilirubin: 1.1 mg/dL (ref 0.3–1.2)
Total Protein: 7 g/dL (ref 6.5–8.1)

## 2021-03-10 LAB — CBC WITH DIFFERENTIAL/PLATELET
Abs Immature Granulocytes: 0.02 10*3/uL (ref 0.00–0.07)
Basophils Absolute: 0 10*3/uL (ref 0.0–0.1)
Basophils Relative: 1 %
Eosinophils Absolute: 0.1 10*3/uL (ref 0.0–0.5)
Eosinophils Relative: 2 %
HCT: 47 % — ABNORMAL HIGH (ref 36.0–46.0)
Hemoglobin: 16.3 g/dL — ABNORMAL HIGH (ref 12.0–15.0)
Immature Granulocytes: 0 %
Lymphocytes Relative: 17 %
Lymphs Abs: 0.8 10*3/uL (ref 0.7–4.0)
MCH: 32.5 pg (ref 26.0–34.0)
MCHC: 34.7 g/dL (ref 30.0–36.0)
MCV: 93.8 fL (ref 80.0–100.0)
Monocytes Absolute: 0.5 10*3/uL (ref 0.1–1.0)
Monocytes Relative: 9 %
Neutro Abs: 3.4 10*3/uL (ref 1.7–7.7)
Neutrophils Relative %: 71 %
Platelets: 193 10*3/uL (ref 150–400)
RBC: 5.01 MIL/uL (ref 3.87–5.11)
RDW: 13.1 % (ref 11.5–15.5)
WBC: 4.8 10*3/uL (ref 4.0–10.5)
nRBC: 0 % (ref 0.0–0.2)

## 2021-03-10 LAB — URINALYSIS, ROUTINE W REFLEX MICROSCOPIC
Bilirubin Urine: NEGATIVE
Glucose, UA: 150 mg/dL — AB
Ketones, ur: 5 mg/dL — AB
Leukocytes,Ua: NEGATIVE
Nitrite: NEGATIVE
Protein, ur: 30 mg/dL — AB
Specific Gravity, Urine: 1.025 (ref 1.005–1.030)
Squamous Epithelial / HPF: NONE SEEN (ref 0–5)
pH: 8 (ref 5.0–8.0)

## 2021-03-10 LAB — LIPASE, BLOOD: Lipase: 40 U/L (ref 11–51)

## 2021-03-10 LAB — PROTIME-INR
INR: 3.3 — ABNORMAL HIGH (ref 0.8–1.2)
Prothrombin Time: 33.2 seconds — ABNORMAL HIGH (ref 11.4–15.2)

## 2021-03-10 LAB — TSH: TSH: 1.187 u[IU]/mL (ref 0.350–4.500)

## 2021-03-10 LAB — APTT: aPTT: 76 seconds — ABNORMAL HIGH (ref 24–36)

## 2021-03-10 MED ORDER — FENTANYL CITRATE PF 50 MCG/ML IJ SOSY
25.0000 ug | PREFILLED_SYRINGE | INTRAMUSCULAR | Status: DC | PRN
  Administered 2021-03-10 – 2021-03-11 (×2): 25 ug via INTRAVENOUS
  Filled 2021-03-10 (×2): qty 1

## 2021-03-10 MED ORDER — ISOSORBIDE MONONITRATE ER 60 MG PO TB24: 60.0000 mg | ORAL_TABLET | Freq: Two times a day (BID) | ORAL | Status: DC

## 2021-03-10 MED ORDER — PSYLLIUM 95 % PO PACK
1.0000 | PACK | Freq: Every day | ORAL | Status: DC | PRN
  Filled 2021-03-10: qty 1

## 2021-03-10 MED ORDER — ACETAMINOPHEN 500 MG PO TABS
1000.0000 mg | ORAL_TABLET | Freq: Four times a day (QID) | ORAL | Status: AC | PRN
  Administered 2021-03-11 – 2021-03-13 (×3): 1000 mg via ORAL
  Filled 2021-03-10 (×3): qty 2

## 2021-03-10 MED ORDER — TRAMADOL HCL 50 MG PO TABS
50.0000 mg | ORAL_TABLET | Freq: Two times a day (BID) | ORAL | Status: DC
  Administered 2021-03-10 – 2021-03-14 (×8): 50 mg via ORAL
  Filled 2021-03-10 (×8): qty 1

## 2021-03-10 MED ORDER — ISOSORBIDE MONONITRATE ER 60 MG PO TB24
60.0000 mg | ORAL_TABLET | Freq: Every day | ORAL | Status: DC
  Administered 2021-03-10: 60 mg via ORAL
  Filled 2021-03-10: qty 1

## 2021-03-10 MED ORDER — ONDANSETRON HCL 4 MG/2ML IJ SOLN
4.0000 mg | Freq: Four times a day (QID) | INTRAMUSCULAR | Status: DC | PRN
Start: 2021-03-10 — End: 2021-03-14

## 2021-03-10 MED ORDER — ACETAMINOPHEN 650 MG RE SUPP: 650.0000 mg | Freq: Four times a day (QID) | RECTAL | Status: AC | PRN

## 2021-03-10 MED ORDER — POLYETHYLENE GLYCOL 3350 17 G PO PACK: 17.0000 g | PACK | Freq: Every day | ORAL | Status: DC | PRN

## 2021-03-10 MED ORDER — LISINOPRIL 20 MG PO TABS
40.0000 mg | ORAL_TABLET | Freq: Two times a day (BID) | ORAL | Status: DC
  Administered 2021-03-11 – 2021-03-14 (×7): 40 mg via ORAL
  Filled 2021-03-10: qty 4
  Filled 2021-03-10: qty 8
  Filled 2021-03-10 (×3): qty 2
  Filled 2021-03-10: qty 4
  Filled 2021-03-10: qty 2

## 2021-03-10 MED ORDER — ONDANSETRON HCL 4 MG PO TABS
4.0000 mg | ORAL_TABLET | Freq: Four times a day (QID) | ORAL | Status: DC | PRN
Start: 2021-03-10 — End: 2021-03-14

## 2021-03-10 MED ORDER — ATENOLOL 25 MG PO TABS
50.0000 mg | ORAL_TABLET | Freq: Once | ORAL | Status: AC
  Administered 2021-03-10: 50 mg via ORAL
  Filled 2021-03-10: qty 2

## 2021-03-10 MED ORDER — KETOROLAC TROMETHAMINE 15 MG/ML IJ SOLN
15.0000 mg | Freq: Four times a day (QID) | INTRAMUSCULAR | Status: AC | PRN
  Filled 2021-03-10: qty 1

## 2021-03-10 MED ORDER — AMLODIPINE BESYLATE 10 MG PO TABS
10.0000 mg | ORAL_TABLET | Freq: Every day | ORAL | Status: DC
  Administered 2021-03-11 – 2021-03-14 (×4): 10 mg via ORAL
  Filled 2021-03-10: qty 1
  Filled 2021-03-10: qty 2
  Filled 2021-03-10: qty 1
  Filled 2021-03-10: qty 2

## 2021-03-10 MED ORDER — NICARDIPINE HCL IN NACL 20-0.86 MG/200ML-% IV SOLN
3.0000 mg/h | INTRAVENOUS | Status: DC
  Administered 2021-03-10: 5 mg/h via INTRAVENOUS
  Administered 2021-03-11 (×2): 3 mg/h via INTRAVENOUS
  Filled 2021-03-10 (×4): qty 200

## 2021-03-10 MED ORDER — LISINOPRIL 10 MG PO TABS
20.0000 mg | ORAL_TABLET | Freq: Once | ORAL | Status: AC
  Administered 2021-03-10: 20 mg via ORAL
  Filled 2021-03-10: qty 2

## 2021-03-10 MED ORDER — PANTOPRAZOLE SODIUM 40 MG PO TBEC
40.0000 mg | DELAYED_RELEASE_TABLET | Freq: Every day | ORAL | Status: DC
  Administered 2021-03-11 – 2021-03-14 (×4): 40 mg via ORAL
  Filled 2021-03-10 (×4): qty 1

## 2021-03-10 MED ORDER — MECLIZINE HCL 25 MG PO TABS
25.0000 mg | ORAL_TABLET | Freq: Three times a day (TID) | ORAL | Status: DC | PRN
  Filled 2021-03-10: qty 1

## 2021-03-10 MED ORDER — HYDROCHLOROTHIAZIDE 25 MG PO TABS
25.0000 mg | ORAL_TABLET | Freq: Every day | ORAL | Status: DC
  Administered 2021-03-12 – 2021-03-13 (×2): 25 mg via ORAL
  Filled 2021-03-10 (×3): qty 1

## 2021-03-10 MED ORDER — WARFARIN SODIUM 2 MG PO TABS: 2.0000 mg | ORAL_TABLET | ORAL | Status: DC

## 2021-03-10 MED ORDER — WARFARIN SODIUM 4 MG PO TABS
4.0000 mg | ORAL_TABLET | ORAL | Status: DC
  Filled 2021-03-10: qty 1

## 2021-03-10 MED ORDER — FENTANYL CITRATE PF 50 MCG/ML IJ SOSY
50.0000 ug | PREFILLED_SYRINGE | Freq: Once | INTRAMUSCULAR | Status: AC
  Administered 2021-03-10: 50 ug via INTRAVENOUS
  Filled 2021-03-10: qty 1

## 2021-03-10 MED ORDER — ATENOLOL 25 MG PO TABS
50.0000 mg | ORAL_TABLET | Freq: Two times a day (BID) | ORAL | Status: DC
  Administered 2021-03-11 – 2021-03-14 (×7): 50 mg via ORAL
  Filled 2021-03-10 (×7): qty 2

## 2021-03-10 MED ORDER — IPRATROPIUM BROMIDE 0.03 % NA SOLN
2.0000 | Freq: Two times a day (BID) | NASAL | Status: DC
  Administered 2021-03-13 – 2021-03-14 (×3): 2 via NASAL
  Filled 2021-03-10 (×2): qty 30

## 2021-03-10 MED ORDER — WARFARIN - PHARMACIST DOSING INPATIENT
Freq: Every day | Status: DC
  Filled 2021-03-10: qty 1

## 2021-03-10 MED ORDER — ALBUTEROL SULFATE (2.5 MG/3ML) 0.083% IN NEBU: 3.0000 mL | INHALATION_SOLUTION | RESPIRATORY_TRACT | Status: DC | PRN

## 2021-03-10 MED ORDER — NITROGLYCERIN 0.4 MG SL SUBL: 0.4000 mg | SUBLINGUAL_TABLET | SUBLINGUAL | Status: DC | PRN

## 2021-03-10 MED ORDER — IOHEXOL 350 MG/ML SOLN
75.0000 mL | Freq: Once | INTRAVENOUS | Status: AC | PRN
  Administered 2021-03-10: 75 mL via INTRAVENOUS

## 2021-03-10 MED ORDER — WARFARIN SODIUM 2 MG PO TABS
2.0000 mg | ORAL_TABLET | Freq: Once | ORAL | Status: AC
  Administered 2021-03-10: 2 mg via ORAL
  Filled 2021-03-10: qty 1

## 2021-03-10 MED ORDER — METOPROLOL TARTRATE 5 MG/5ML IV SOLN
5.0000 mg | Freq: Once | INTRAVENOUS | Status: AC
  Administered 2021-03-10: 5 mg via INTRAVENOUS
  Filled 2021-03-10: qty 5

## 2021-03-10 NOTE — H&P (Signed)
History and Physical   Leah Holmes XBD:[id number] DOB: 06/06/1927 DOA: 03/10/2021  PCP: Crecencio Mc, MD  Outpatient Specialists: Dr. Bary Castilla, [facility] General surgery; Dr. [name], cardiology Patient coming from: clinic  I have personally briefly reviewed patient's old medical records in [place].  Chief Concern: back pain  HPI: Leah Holmes is a 87 y.o. female with medical history significant for hypertension, atrial fibrillation, chronic pain, GERD, constipation, who presents to the emergency department for chief concerns of back pain.  She states that her back pain started today.  She states that in the last 2 weeks she fell on her bed several times..  She states at that time her back did not hurt very much.  However, over the last week, she has been having worsening back pain.  She states that the pain is all over.  She states that the location of the pain is at the mid back. At bedside she is able to tell me her name, her age, her current location, the current calendar year.  She is aware that she is in the hospital.  She states that her daughter just left.  She was able to flex and extend her arms without difficulty, bend her knees bilaterally and move her legs without difficulty.  She was able to wiggle her toes.  Her face did not exhibit any asymmetry and there was no slurring of speech.  She does have upper extremity tremors and she states that this is normal for her.  She denies fever, nausea, vomiting, chest pain, shortness of breath, abdominal pain, dysuria, hematuria, diarrhea.  She states that the TLSO brace is placed too tight.  I ask if she would like me to call a tech to assist with readjusting the brace and she yells at me know.  She does not want anyone to touch it tonight.  She wants to be left alone.  ROS: Constitutional: no weight change, no fever ENT/Mouth: no sore throat, no rhinorrhea Eyes: no eye pain, no vision changes Cardiovascular:  no chest pain, no dyspnea,  no edema, no palpitations Respiratory: no cough, no sputum, no wheezing Gastrointestinal: no nausea, no vomiting, no diarrhea, no constipation Genitourinary: no urinary incontinence, no dysuria, no hematuria Musculoskeletal: no arthralgias, no myalgias, back pain Skin: no skin lesions, no pruritus, Neuro: + weakness, no loss of consciousness, no syncope Psych: no anxiety, no depression, + decrease appetite Heme/Lymph: no bruising, no bleeding  ED Course: Discussed with ED provider, patient requiring hospitalization for chief concerns of hypertensive urgency and pain control in setting of L2 compression fracture.  Vitals in the emergency department was remarkable for temperature of 98.4, respiration rate of 14, heart rate of 60, initial blood pressure 182/82, increased to 258/98, SPO2 of 98% on room air.  Assessment/Plan  Principal Problem:   Hypertensive urgency Active Problems:   CAD (coronary artery disease)   DM type 2 with diabetic peripheral neuropathy (HCC)   Hyperlipidemia associated with type 2 diabetes mellitus ([place])   Essential hypertension, benign   Chronic atrial fibrillation (HCC)   Generalized anxiety disorder   Constipation   Major depressive disorder, recurrent episode (HCC)   Sick sinus syndrome (HCC)   Fatigue   Invasive ductal carcinoma of breast, female, left (HCC)   Hyperlipidemia, mixed   LVH (left ventricular hypertrophy)   S/P placement of cardiac pacemaker   Back pain   # Hypertensive urgency-I suspect this is secondary to pain in setting of acute/subacute compression fracture involving the  superior endplate of L2. - Pain control - Resume home tramadol 50 mg p.o. twice daily, ketorolac 50 mg IV every 6 hours as needed for moderate pain, 1 day ordered - Fentanyl 25 mcg IV every 4 hours as needed for severe pain, 3 doses ordered - Admit to stepdown, inpatient, telemetry  # Acute/subacute compression fracture involving  superior endplate of L2 with approximate 30% loss of height - Continue TLSO brace - Recommend outpatient follow-up with orthopedic/interventional radiology for vertebroplasty - Fall precautions  # Hypertension-patient is status post atenolol 50 mg p.o., fentanyl 50 mcg, Imdur 60 mg p.o., lisinopril 20 mg p.o., Lopressor 5 mg IV per EDP - Her home medications were resumed with the consideration that patient received her home dosing per EDP - Patient was started on Cardene drip, with goal SBP of 175-190 until 2200 on day of admission, SBP goal of 160-180 after 2200  # QT prolongation-avoid QT prolonging agent, monitor  # Atrial fibrillation-warfarin per pharmacy, check INR, PTT, PT  # Chronic pain-resumed tramadol 50 mg p.o. twice daily  Resumed home inhaler  Chart reviewed.   DVT prophylaxis: Warfarin per pharmacy Code Status: Full code Diet: Heart healthy Family Communication: No Disposition Plan: Pending clinical course Consults called: No Admission status: Stepdown, inpatient, telemetry  Past Medical History:  Diagnosis Date   Atrial fibrillation ([place]) 2012   Breast cancer ([place]) 04/2018   IDC of left breast    CHF (congestive heart failure) ([place])    Colon polyp 2003   Coronary artery disease    COVID-19 virus infection 12/25/2019   Diabetes mellitus without complication (HCC)    Dyspnea    Dysrhythmia    atrial fib   Environmental and seasonal allergies    GERD (gastroesophageal reflux disease)    Hyperlipidemia    Hypertension    Irritable bowel syndrome    Multiple rib fractures 03/15/2018   Personal history of radiation therapy    Polyp of colon    PONV (postoperative nausea and vomiting)    with ether, not recently "woke up in surgery once"   Presence of permanent cardiac pacemaker    Tremor    Tremors of nervous system    Ulcer    Past Surgical History:  Procedure Laterality Date   ABDOMINAL ADHESION SURGERY     ABDOMINAL HYSTERECTOMY     APPENDECTOMY      BREAST BIOPSY Right late 80s/early 90s   benign   BREAST BIOPSY Left 03/31/2018   [place] bx done with Dr. Bary Castilla, invasive ductal carcinoma   BREAST LUMPECTOMY Left 04/22/2018   Procedure: BREAST LUMPECTOMY;  Surgeon: Robert Bellow, MD;  Location: [facility] ORS;  Service: General;  Laterality: Left;   BREAST LUMPECTOMY Left 04/22/2018   [place], negative LN, clear margins   BREAST LUMPECTOMY Left 2020   positive, done in office   BREAST SURGERY Right    biopsy    CESAREAN SECTION     x 2   CHOLECYSTECTOMY  03/2012   Dr Burt Knack   COLONOSCOPY  2003   Dr Theodosia Paling in [place]   fibroid tumor     [place] N/A 01/28/2016   Procedure: INSERTION PACEMAKER;  Surgeon: Isaias Cowman, MD;  Location: [facility] ORS;  Service: Cardiovascular;  Laterality: N/A;   SENTINEL NODE BIOPSY Left 04/22/2018   Procedure: SENTINEL NODE BIOPSY;  Surgeon: Robert Bellow, MD;  Location: [facility] ORS;  Service: General;  Laterality: Left;   UPPER GI ENDOSCOPY  2003   Dr Theodosia Paling  in [place] History:  reports that she has never smoked. She has quit using smokeless tobacco.  Her smokeless tobacco use included snuff. She reports that she does not drink alcohol and does not use drugs.  Allergies  Allergen Reactions   Codeine Anaphylaxis   Penicillins Anaphylaxis    Has patient had a PCN reaction causing immediate rash, facial/tongue/throat swelling, SOB or lightheadedness with hypotension: Yes Has patient had a PCN reaction causing severe rash involving mucus membranes or skin necrosis: No Has patient had a PCN reaction that required hospitalization Yes Has patient had a PCN reaction occurring within the last 10 years: No If all of the above answers are "NO", then may proceed with Cephalosporin use.   Amlodipine Other (See Comments)    Diffuse itching    Atorvastatin     Other reaction(s): Unknown   Clonidine Derivatives Other (See Comments)    Reaction:  Unknown    Gabapentin Nausea Only    Losartan Other (See Comments)    Other reaction(s): Other (See Comments) Reaction:  Muscle aches  Reaction:  Muscle aches    Morphine     Other reaction(s): Unknown   Morphine And Related Other (See Comments)    Reaction:  Unknown    Reglan [Metoclopramide] Other (See Comments)    Reaction:  Tremors    Hydralazine Anxiety   Family History  Problem Relation Age of Onset   Breast cancer Sister    Cancer Sister 76       breast   Breast cancer Maternal Aunt 68   Breast cancer Cousin    Breast cancer Other 40   Family history: Family history reviewed and not pertinent  Prior to Admission medications   Medication Sig Start Date End Date Taking? Authorizing Provider  amLODipine (NORVASC) 5 MG tablet TAKE ONE TABLET (5 mg) BY MOUTH every morning 11/22/19  Yes Crecencio Mc, MD  atenolol (TENORMIN) 50 MG tablet Take 1 tablet by mouth twice daily 12/23/20  Yes Crecencio Mc, MD  Cholecalciferol (VITAMIN D3) 1000 UNITS CAPS Take 1,000 Units by mouth every morning.    Yes [provider]  hydrochlorothiazide (HYDRODIURIL) 25 MG tablet Take 25 mg by mouth daily.  12/12/15  Yes [provider]  isosorbide mononitrate (IMDUR) 60 MG 24 hr tablet Take 60 mg by mouth 2 (two) times daily. 12/27/15  Yes [provider]  lisinopril (ZESTRIL) 20 MG tablet Take 2 tablets by mouth twice daily 02/11/21  Yes Crecencio Mc, MD  omeprazole (PRILOSEC) 20 MG capsule Take 1 capsule by mouth once daily 05/30/20  Yes Crecencio Mc, MD  traMADol (ULTRAM) 50 MG tablet Take 1 tablet (50 mg total) by mouth 2 (two) times daily for 5 days. 03/05/21 03/10/21 Yes Menshew, Dannielle Karvonen, PA-C  warfarin (COUMADIN) 2 MG tablet Take 2 mg by mouth 2 (two) times a week. Take 2mg  at bedtime on Wednesday & Sunday. 01/29/20  Yes [provider]  warfarin (COUMADIN) 4 MG tablet Take 4 mg by mouth daily at 6 PM. Does not take Wednesday or Sunday.   Yes [provider]  albuterol (PROAIR  HFA) 108 (90 Base) MCG/ACT inhaler Inhale 1-2 puffs into the lungs every 4 (four) hours as needed for wheezing or shortness of breath. Patient not taking: No sig reported 11/17/17   Laverle Hobby, MD  glucose blood (ONE TOUCH ULTRA TEST) test strip USE 1 STRIP TO CHECK GLUCOSE TWICE DAILY  Dx code: E11.9  05/29/19   Crecencio Mc, MD  glucose blood (ONE TOUCH ULTRA TEST) test strip USE ONE STRIP TO CHECK GLUCOSE ONCE  DAILY 06/02/19   Crecencio Mc, MD  ipratropium (ATROVENT) 0.03 % nasal spray Place 2 sprays into both nostrils every 12 (twelve) hours. 02/23/20   Crecencio Mc, MD  Lancets [facility] DELICA PLUS [id number]) MISC Inject 2 Devices into the skin daily. DX Code E11.9. 01/30/19   Crecencio Mc, MD  lidocaine (LIDODERM) 5 % Place 1 patch onto the skin every 12 (twelve) hours as needed for up to 10 days. Remove & Discard patch after 12 hours of wear each day. Patient not taking: No sig reported 03/05/21 03/15/21  Menshew, Dannielle Karvonen, PA-C  meclizine (ANTIVERT) 25 MG tablet Take 1 tablet by mouth three times daily as needed 06/24/20   Crecencio Mc, MD  mometasone (ELOCON) 0.1 % cream Apply once ot twice daily as needed for itching 09/13/20   Crecencio Mc, MD  nitroGLYCERIN (NITROSTAT) 0.4 MG SL tablet Place 0.4 mg under the tongue every 5 (five) minutes as needed for chest pain.    [provider]  [facility] ULTRA test strip USE 1 STRIP TO CHECK GLUCOSE TWICE DAILY 05/15/19   Crecencio Mc, MD  Polyethyl Glycol-Propyl Glycol 0.4-0.3 % SOLN Place 1 drop into both eyes 3 (three) times daily as needed (for dry/irritated eyes.).    [provider]  polyethylene glycol (MIRALAX / GLYCOLAX) 17 g packet Take 17 g by mouth daily as needed.    [provider]  psyllium (METAMUCIL) 58.6 % packet Take 1 packet by mouth daily as needed (for regularity).     [provider]  triamcinolone cream (KENALOG) 0.1 % APPLY 1 APPLICATION TOPICALLY TWICE DAILY  04/18/20   Crecencio Mc, MD   Physical Exam: Vitals:   03/10/21 1615 03/10/21 1730 03/10/21 1830 03/10/21 1900  BP: (!) 258/98 (!) 245/100 (!) 236/123 (!) 216/102  Pulse: 61 63 75 60  Resp: (!) 23 20 16 20   Temp:      TempSrc:      SpO2: 98% 96% (!) 88% 97%   Constitutional: appears age-appropriate, frail, NAD, calm, comfortable Eyes: PERRL, lids and conjunctivae normal ENMT: Mucous membranes are moist. Posterior pharynx clear of any exudate or lesions. Age-appropriate dentition. Hearing appropriate Neck: normal, supple, no masses, no thyromegaly Respiratory: clear to auscultation bilaterally, no wheezing, no crackles. Normal respiratory effort. No accessory muscle use.  Cardiovascular: Regular rate and rhythm, no murmurs / rubs / gallops. No extremity edema. 2+ pedal pulses. No carotid bruits.  Abdomen: no tenderness, no masses palpated, no hepatosplenomegaly. Bowel sounds positive.  Musculoskeletal: no clubbing / cyanosis. No joint deformity upper and lower extremities. Good ROM, no contractures, no atrophy. Normal muscle tone.  TLSO brace in place Skin: no rashes, lesions, ulcers. No induration Neurologic: Sensation intact. Strength 5/5 in all 4.  Psychiatric: Normal judgment and insight. Alert and oriented x 3. Normal mood.   EKG: independently reviewed, showing Atrial fibrillation with rate of 67, QTc 518  Chest x-ray on Admission: I personally reviewed and I agree with radiologist reading as below.  CT ABDOMEN PELVIS W CONTRAST  Result Date: 03/10/2021 CLINICAL DATA:  Left abdominal pain.  Back pain. EXAM: CT ABDOMEN AND PELVIS WITH CONTRAST TECHNIQUE: Multidetector CT imaging of the abdomen and pelvis was performed using the standard protocol following bolus administration of intravenous contrast. CONTRAST:  7mL OMNIPAQUE IOHEXOL 350 MG/ML SOLN COMPARISON:  Chest CTA 03/07/2018 and 01/11/2018 FINDINGS: Lower chest: Multiple small sub solid opacities in the periphery of the  lower lungs. Largest opacity measures roughly 5 mm on sequence 4, image 14. Some of these peripheral opacities are chronic since 2019 but others may be new. Cardiac lead in the right ventricle. Trace pleural fluid versus minimal pleural thickening, left side greater than right. Hepatobiliary: Small focal area of enhancement in the lateral left hepatic lobe is unchanged since 2019 and most compatible with an incidental finding. Cholecystectomy. No biliary dilatation. Pancreas: Unremarkable. No pancreatic ductal dilatation or surrounding inflammatory changes. Spleen: Normal in size without focal abnormality. Adrenals/Urinary Tract: Normal adrenal glands. Bilateral renal cysts. Normal appearance of the urinary bladder. No hydronephrosis. Stomach/Bowel: Question polyp or ingested tablet in the sigmoid colon on sequence 2 image 62. Normal appearance of the stomach. No evidence for bowel inflammation. No evidence for a bowel obstruction. Vascular/Lymphatic: No abdominopelvic lymph node enlargement. Diffuse atherosclerotic calcifications. No abdominal aortic aneurysm. Reproductive: Status post hysterectomy. No adnexal masses. Other: Trace pelvic free fluid. Laxity and irregularity along the anterior abdominal wall is likely postoperative in etiology. Musculoskeletal: Compression fracture involving the superior endplate of L2. This is most compatible with an acute/subacute fracture. No significant bone retropulsion. Approximately 30% loss of height. IMPRESSION: 1. Acute/subacute compression fracture involving the superior endplate of L2. Approximately 30% loss of height. 2. Trace free fluid in the pelvis. There was a similar finding on a exam from 2019. These findings are nonspecific but could be associated with an occult inflammatory process in the abdomen and pelvis. 3. Bilateral renal cysts. 4. Small peripheral opacities at both lung bases. Some of these are chronic. These small opacities are likely postinflammatory in  etiology. No follow-up needed if patient is low-risk (and has no known or suspected primary neoplasm). Non-contrast chest CT can be considered in 12 months if patient is high-risk. This recommendation follows the consensus statement: Guidelines for Management of Incidental Pulmonary Nodules Detected on CT Images: From the Fleischner Society 2017; Radiology 2017; 284:228-243. Electronically Signed   By: Markus Daft M.D.   On: 03/10/2021 17:23    Labs on Admission: I have personally reviewed following labs  CBC: Recent Labs  Lab 03/10/21 1455  WBC 4.8  NEUTROABS 3.4  HGB 16.3*  HCT 47.0*  MCV 93.8  PLT 353   Basic Metabolic Panel: Recent Labs  Lab 03/10/21 1455  NA 134*  K 3.6  CL 98  CO2 26  GLUCOSE 190*  BUN 12  CREATININE 0.58  CALCIUM 9.3   GFR: Estimated Creatinine Clearance: 40.8 mL/min (by C-G formula based on SCr of 0.58 mg/dL).  Liver Function Tests: Recent Labs  Lab 03/10/21 1455  AST 27  ALT 21  ALKPHOS 68  BILITOT 1.1  PROT 7.0  ALBUMIN 3.7   Recent Labs  Lab 03/10/21 1455  LIPASE 40   Urine analysis:    Component Value Date/Time   COLORURINE STRAW (A) 03/10/2021 1511   APPEARANCEUR CLEAR (A) 03/10/2021 1511   APPEARANCEUR Clear 10/05/2014 2114   LABSPEC 1.025 03/10/2021 1511   LABSPEC 1.027 10/05/2014 2114   PHURINE 8.0 03/10/2021 1511   GLUCOSEU 150 (A) 03/10/2021 1511   GLUCOSEU Negative 10/05/2014 2114   HGBUR MODERATE (A) 03/10/2021 1511   BILIRUBINUR NEGATIVE 03/10/2021 1511   BILIRUBINUR negative 01/19/2017 1531   BILIRUBINUR Negative 10/05/2014 2114   KETONESUR 5 (A) 03/10/2021 1511   PROTEINUR 30 (A) 03/10/2021 1511   UROBILINOGEN 0.2 01/19/2017 1531  NITRITE NEGATIVE 03/10/2021 1511   LEUKOCYTESUR NEGATIVE 03/10/2021 1511   LEUKOCYTESUR 2+ 10/05/2014 2114   Dr. Tobie Poet [facility]  If 7PM-7AM, please contact overnight-coverage provider If 7AM-7PM, please contact day coverage provider [email or web address]  03/10/2021, 7:55 PM

## 2021-03-10 NOTE — ED Notes (Signed)
Pt given warm blanket. Provider at bedside.

## 2021-03-10 NOTE — Assessment & Plan Note (Addendum)
Uncontrolled. Back pain was sudden onset acute 5 days ago, worsening.  Unfortunately she has failed conservative therapy with extra strength Tylenol, tramadol twice daily.  She unable to tolerate lidocaine patch.  Flexeril without relief.  History of atrial fibrillation, compliant with warfarin and history of gastritis, she is not a candidate for NSAIDs.  Discussed at length with patient and daughter today that I was concerned I was being unable to manage her pain adequately.  We discussed increasing tramadol, a trial of Cymbalta however patient appears to be in tremendous pain in the office today and I am concerned that this may not be musculoskeletal etiology.  Discussed consideration for CT abdomen pelvis due to patient's history of left breast cancer to exclude metastasis, acute GI pathology.Discussed XR thoracic spine to complete work up as well. Lastly discussed consideration for admission for adequate pain control, work up.  Patient and daughter be agreeable to going to [facility] emergency room.  Daughter declines emergency transport.  Referral in place to Dr Leanord Hawking ,  Psychiatry for evaluation after she is seen in ED. Report has been given to triage Aldona Bar at [facility] ED.

## 2021-03-10 NOTE — ED Notes (Signed)
Messaged Dr. Tobie Poet regarding the patients Cardene rate - Ok'd to decrease from 5mg  to 3mg .

## 2021-03-10 NOTE — ED Notes (Signed)
Phlebotomy called about obtaining labs.

## 2021-03-10 NOTE — Assessment & Plan Note (Signed)
Extremely elevated today. No cp, sob.  I expected pain today is contributory.Advised to continue Amlodipine 5 mg, Imdur 60 mg, lisinopril 20 mg, atenolol 50mg . She  Takes  hydrochlorothiazide 25 mg QOD and hasnt taken today. Advised if blood pressure remains elevated despite improved pain control, she may consider starting HCTZ 25mg  qd with close attention to electrolytes, Crt.

## 2021-03-10 NOTE — ED Notes (Signed)
TLSO  BRACE  CALLED FOR  PER  DR  Archie Balboa  MD

## 2021-03-10 NOTE — ED Notes (Signed)
Patient transported to CT 

## 2021-03-10 NOTE — ED Triage Notes (Signed)
Pt presents to ED from home C/O back pain X 2 weeks. Seen recently and prescribed pain medications, but they are giving her no relief.

## 2021-03-10 NOTE — ED Notes (Signed)
Phlebotomy to obtain labs for patient.

## 2021-03-10 NOTE — Progress Notes (Signed)
Orthopedic Tech Progress Note Patient Details:  Leah Holmes [birth year] [id number]  Called in order to [employer] for a TLSO BRACE   Patient ID: [name], female   DOB: Oct 26, 1926, 87 y.o.   MRN: [id number]  [name] 03/10/2021, 7:06 PM

## 2021-03-10 NOTE — Consult Note (Addendum)
ANTICOAGULATION CONSULT NOTE - Follow Up Consult  Pharmacy Consult for  Warfarin Indication: atrial fibrillation  Allergies  Allergen Reactions   Codeine Anaphylaxis   Penicillins Anaphylaxis    Has patient had a PCN reaction causing immediate rash, facial/tongue/throat swelling, SOB or lightheadedness with hypotension: Yes Has patient had a PCN reaction causing severe rash involving mucus membranes or skin necrosis: No Has patient had a PCN reaction that required hospitalization Yes Has patient had a PCN reaction occurring within the last 10 years: No If all of the above answers are "NO", then may proceed with Cephalosporin use.   Amlodipine Other (See Comments)    Diffuse itching    Atorvastatin     Other reaction(s): Unknown   Clonidine Derivatives Other (See Comments)    Reaction:  Unknown    Gabapentin Nausea Only   Losartan Other (See Comments)    Other reaction(s): Other (See Comments) Reaction:  Muscle aches  Reaction:  Muscle aches    Morphine     Other reaction(s): Unknown   Morphine And Related Other (See Comments)    Reaction:  Unknown    Reglan [Metoclopramide] Other (See Comments)    Reaction:  Tremors    Hydralazine Anxiety    Patient Measurements:  Vital Signs: Temp: 97.7 F (36.5 C) (09/26 1253) Temp Source: Oral (09/26 1253) BP: 143/89 (09/26 2130) Pulse Rate: 68 (09/26 2130)  Labs: Recent Labs    03/10/21 1455  HGB 16.3*  HCT 47.0*  PLT 193  CREATININE 0.58    Estimated Creatinine Clearance: 40.8 mL/min (by C-G formula based on SCr of 0.58 mg/dL).   Medications:  PTA warfarin 4 mg daily at 6 pm (except wednesday or Sunday) and 2 mg twice per week on Wednesday and Sunday  Assessment: 87yo Female w/ h/o CHF, Afib, breast cancer, CAD, HLD, HTN, & ulcers presenting from clinic with CC of back pain. CT scan c/w L2 Compression fracture. Pharmacy consulted for the mgmt of warfarin dosing while inpatient.    Date Time INR Dose/Comment 9/24 2000 Unk PTA dose 4mg  9/25 2000 Unk PTA dose 2mg  9/26  2150 3.3  2mg  x1   Goal of Therapy:  INR 2-3 Monitor platelets by anticoagulation protocol: Yes   Plan:  Stat INR revealed supratherapeutic INR of 3.3 (goal 2-3); no current DDI's. Based on pt's home regimen, tonight would have been a 4mg  dose, however will reduce by 50% and dose 2mg  x1 tonight. CTM INR trend and adjust accordingly from home regimen as entered. CBC ordered  Lorna Dibble 03/10/2021,10:00 PM

## 2021-03-10 NOTE — ED Notes (Signed)
Secretary called to get a TLSO brace for the patient - they stated it may be ~2-3 hours before they will be able to come the [facility].

## 2021-03-10 NOTE — Progress Notes (Signed)
Subjective:    Patient ID: Leah Holmes, female    DOB: [birth year], 87 y.o.   MRN: [id number]  CC: Leah Holmes is a 87 y.o. female who presents today for an acute visit.    HPI: Accompanied by daughter today Complaints of low back pain x 6 days, worsening. Pain all over. Started lumbar mid spine and today she complains of mid back pain, low back pain and left leg pain.  No heavy lifting or injury. Had been taking tylenol extra strength and tramadol 50g BID without relief.She tried flexeril from her daughter without relief.  Unable to tolerate lidocaine patch as  'skin is too thin'  Not sleeping due to the pain.  No h/o back pain.  Chronic peripheral neuropathy.   Low abdominal pain x 5 days ago, since resolved.  She thinks related to tramadol use and slowed bowel movement. Started miralax yesterday after starting tramadol and reports small brown BM yesterday.  No dysuria, urinary frequency, rash.   H/o shingles  HTN- compliant with Amlodipine 5 mg, Imdur 60 mg, lisinopril 20 mg, atenolol 50mg . She doesn't take hydrochlorothiazide 25 mg daily but takes QOD  Blood pressure 150-190/ 70. This is normal for her.   No cp, sob.   H/o atrial fib-compliant with warfarin  Seen 03/05/21 in ED for low back pain.  Advised lidocaine patch, provided tramadol No injury or trauma.  X-ray lumbar spine normal alignment of vertebral bodies little generative changes.  Advanced aortic and iliac calcifications.  Left hip x-ray showed mild bilateral hip joint degenerative changes.  SI joints are intact.  No pelvic fractures bone lesions UA negative for blood  GFR 76 09/13/20 She has h/o Cholecystectomy, hysterectomy, appendectomy.  No history of renal stone She follows with cardiology, Jettie Booze last seen 12/10/20  for coronary artery disease, sick sinus syndrome.  At follow-up in June, she denied any repeat exercise stress testing or myocardial perfusion scanning  Follows with  Dr. Bary Castilla history of left breast cancer HISTORY:  Past Medical History:  Diagnosis Date   Atrial fibrillation ([place]) 2012   Breast cancer ([place]) 04/2018   IDC of left breast    CHF (congestive heart failure) ([place])    Colon polyp 2003   Coronary artery disease    COVID-19 virus infection 12/25/2019   Diabetes mellitus without complication (H[facility])    Dyspnea    Dysrhythmia    atrial fib   Environmental and seasonal allergies    GERD (gastroesophageal reflux disease)    Hyperlipidemia    Hypertension    Irritable bowel syndrome    Multiple rib fractures 03/15/2018   Personal history of radiation therapy    Polyp of colon    PONV (postoperative nausea and vomiting)    with ether, not recently "woke up in surgery once"   Presence of permanent cardiac pacemaker    Tremor    Tremors of nervous system    Ulcer    Past Surgical History:  Procedure Laterality Date   ABDOMINAL ADHESION SURGERY     ABDOMINAL HYSTERECTOMY     APPENDECTOMY     BREAST BIOPSY Right late 80s/early 90s   benign   BREAST BIOPSY Left 03/31/2018   [place] bx done with Dr. Bary Castilla, invasive ductal carcinoma   BREAST LUMPECTOMY Left 04/22/2018   Procedure: BREAST LUMPECTOMY;  Surgeon: Robert Bellow, MD;  Location: [facility] ORS;  Service: General;  Laterality: Left;   BREAST LUMPECTOMY Left 04/22/2018   [facility], negative LN,  clear margins   BREAST LUMPECTOMY Left 2020   positive, done in office   BREAST SURGERY Right    biopsy    CESAREAN SECTION     x 2   CHOLECYSTECTOMY  03/2012   Dr Burt Knack   COLONOSCOPY  2003   Dr Theodosia Paling in [place]   fibroid tumor     [place] N/A 01/28/2016   Procedure: INSERTION PACEMAKER;  Surgeon: Isaias Cowman, MD;  Location: [facility] ORS;  Service: Cardiovascular;  Laterality: N/A;   SENTINEL NODE BIOPSY Left 04/22/2018   Procedure: SENTINEL NODE BIOPSY;  Surgeon: Robert Bellow, MD;  Location: [facility] ORS;  Service: General;  Laterality: Left;   UPPER GI ENDOSCOPY   2003   Dr Theodosia Paling in [place]   Family History  Problem Relation Age of Onset   Breast cancer Sister    Cancer Sister 62       breast   Breast cancer Maternal Aunt 68   Breast cancer Cousin    Breast cancer Other 59    Allergies: Codeine, Penicillins, Amlodipine, Atorvastatin, Clonidine derivatives, Gabapentin, Losartan, Morphine, Morphine and related, Reglan [metoclopramide], and Hydralazine Current Outpatient Medications on File Prior to Visit  Medication Sig Dispense Refill   amLODipine (NORVASC) 5 MG tablet TAKE ONE TABLET (5 mg) BY MOUTH every morning 90 tablet 3   atenolol (TENORMIN) 50 MG tablet Take 1 tablet by mouth twice daily 180 tablet 0   Cholecalciferol (VITAMIN D3) 1000 UNITS CAPS Take 1,000 Units by mouth every morning.      glucose blood (ONE TOUCH ULTRA TEST) test strip USE 1 STRIP TO CHECK GLUCOSE TWICE DAILY  Dx code: E11.9 200 each 3   glucose blood (ONE TOUCH ULTRA TEST) test strip USE ONE STRIP TO CHECK GLUCOSE ONCE  DAILY 100 each 3   hydrochlorothiazide (HYDRODIURIL) 25 MG tablet Take 25 mg by mouth daily.      ipratropium (ATROVENT) 0.03 % nasal spray Place 2 sprays into both nostrils every 12 (twelve) hours. 30 mL 12   isosorbide mononitrate (IMDUR) 60 MG 24 hr tablet Take 60 mg by mouth 2 (two) times daily.     Lancets (ONETOUCH DELICA PLUS OZDGUY40H) MISC Inject 2 Devices into the skin daily. DX Code E11.9. 200 each 2   lisinopril (ZESTRIL) 20 MG tablet Take 2 tablets by mouth twice daily 180 tablet 0   meclizine (ANTIVERT) 25 MG tablet Take 1 tablet by mouth three times daily as needed 90 tablet 0   mometasone (ELOCON) 0.1 % cream Apply once ot twice daily as needed for itching 15 g 1   nitroGLYCERIN (NITROSTAT) 0.4 MG SL tablet Place 0.4 mg under the tongue every 5 (five) minutes as needed for chest pain.     omeprazole (PRILOSEC) 20 MG capsule Take 1 capsule by mouth once daily 90 capsule 0   ONETOUCH ULTRA test strip USE 1 STRIP TO CHECK GLUCOSE TWICE  DAILY 100 each 0   Polyethyl Glycol-Propyl Glycol 0.4-0.3 % SOLN Place 1 drop into both eyes 3 (three) times daily as needed (for dry/irritated eyes.).     polyethylene glycol (MIRALAX / GLYCOLAX) 17 g packet Take 17 g by mouth daily as needed.     psyllium (METAMUCIL) 58.6 % packet Take 1 packet by mouth daily as needed (for regularity).      traMADol (ULTRAM) 50 MG tablet Take 1 tablet (50 mg total) by mouth 2 (two) times daily for 5 days. 10 tablet 0   triamcinolone cream (KENALOG)  0.1 % APPLY 1 APPLICATION TOPICALLY TWICE DAILY 30 g 0   warfarin (COUMADIN) 2 MG tablet Take 2 mg by mouth 2 (two) times a week. Take 2mg  at bedtime on Wednesday & Sunday.     warfarin (COUMADIN) 4 MG tablet Take 4 mg by mouth daily at 6 PM. Does not take Wednesday or Sunday.     albuterol (PROAIR HFA) 108 (90 Base) MCG/ACT inhaler Inhale 1-2 puffs into the lungs every 4 (four) hours as needed for wheezing or shortness of breath. (Patient not taking: Reported on 03/10/2021) 1 Inhaler 5   lidocaine (LIDODERM) 5 % Place 1 patch onto the skin every 12 (twelve) hours as needed for up to 10 days. Remove & Discard patch after 12 hours of wear each day. (Patient not taking: Reported on 03/10/2021) 10 patch 0   No current facility-administered medications on file prior to visit.    Social History   Tobacco Use   Smoking status: Never   Smokeless tobacco: Former    Types: Snuff   Tobacco comments:    occasionally  Vaping Use   Vaping Use: Never used  Substance Use Topics   Alcohol use: No   Drug use: No    Review of Systems  Constitutional:  Negative for chills and fever.  HENT:  Negative for congestion.   Respiratory:  Negative for cough.   Cardiovascular:  Negative for chest pain and palpitations.  Gastrointestinal:  Negative for abdominal pain (resolved), nausea and vomiting.  Genitourinary:  Negative for difficulty urinating and hematuria.  Musculoskeletal:  Positive for back pain.  Neurological:   Positive for numbness.     Objective:    BP (!) 182/82 (BP Location: Left Arm, Patient Position: Sitting, Cuff Size: Normal)   Pulse 60   Temp 98.4 F (36.9 C) (Oral)   SpO2 96%   BP Readings from Last 3 Encounters:  03/10/21 (!) 182/82  03/05/21 (!) 189/89  11/27/20 (!) 174/77    Physical Exam Vitals reviewed.  Constitutional:      Appearance: She is well-developed.  Eyes:     Conjunctiva/sclera: Conjunctivae normal.  Cardiovascular:     Rate and Rhythm: Normal rate. Rhythm regularly irregular.     Pulses: Normal pulses.     Heart sounds: Normal heart sounds.  Pulmonary:     Effort: Pulmonary effort is normal.     Breath sounds: Normal breath sounds. No wheezing, rhonchi or rales.  Musculoskeletal:     Thoracic back: Tenderness present. No swelling, edema or bony tenderness. Normal range of motion.     Lumbar back: Spasms and tenderness present. No swelling, edema or bony tenderness. Normal range of motion.     Comments: Sitting in wheelchair. Grimacing. Pain with palpation of thoracic spine. Questionable CVA tenderness as patient is very tender during exam. Full range of motion with flexion, tension. No increased pain elicited with single leg raise bilaterally.   Skin:    General: Skin is warm and dry.  Neurological:     Mental Status: She is alert.     Sensory: No sensory deficit.     Deep Tendon Reflexes:     Reflex Scores:      Patellar reflexes are 2+ on the right side and 2+ on the left side. Psychiatric:        Speech: Speech normal.        Behavior: Behavior normal.        Thought Content: Thought content normal.  Assessment & Plan:   Problem List Items Addressed This Visit       Cardiovascular and Mediastinum   Essential hypertension, benign    Extremely elevated today. No cp, sob.  I expected pain today is contributory.Advised to continue Amlodipine 5 mg, Imdur 60 mg, lisinopril 20 mg, atenolol 50mg . She  Takes  hydrochlorothiazide 25 mg QOD and  hasnt taken today. Advised if blood pressure remains elevated despite improved pain control, she may consider starting HCTZ 25mg  qd with close attention to electrolytes, Crt.        Other   Back pain - Primary    Uncontrolled. Back pain was sudden onset acute 5 days ago, worsening.  Unfortunately she has failed conservative therapy with extra strength Tylenol, tramadol twice daily.  She unable to tolerate lidocaine patch.  Flexeril without relief.  History of atrial fibrillation, compliant with warfarin and history of gastritis, she is not a candidate for NSAIDs.  Discussed at length with patient and daughter today that I was concerned I was being unable to manage her pain adequately.  We discussed increasing tramadol, a trial of Cymbalta however patient appears to be in tremendous pain in the office today and I am concerned that this may not be musculoskeletal etiology.  Discussed consideration for CT abdomen pelvis due to patient's history of left breast cancer to exclude metastasis, acute GI pathology.Discussed XR thoracic spine to complete work up as well. Lastly discussed consideration for admission for adequate pain control, work up.  Patient and daughter be agreeable to going to [facility] emergency room.  Daughter declines emergency transport.  Referral in place to Dr Leanord Hawking ,  Psychiatry for evaluation after she is seen in ED. Report has been given to triage Aldona Bar at [facility] ED.       Relevant Orders   Ambulatory referral to Orthopedic Surgery       I am having [place]. Lassalle maintain her nitroGLYCERIN, psyllium, Vitamin D3, isosorbide mononitrate, hydrochlorothiazide, albuterol, Polyethyl Glycol-Propyl Glycol, warfarin, OneTouch Delica Plus [id number], OneTouch Ultra, glucose blood, glucose blood, amLODipine, warfarin, ipratropium, triamcinolone cream, omeprazole, meclizine, mometasone, atenolol, lisinopril, lidocaine, traMADol, and polyethylene glycol.   No orders of the defined types  were placed in this encounter.   Return precautions given.   Risks, benefits, and alternatives of the medications and treatment plan prescribed today were discussed, and patient expressed understanding.   Education regarding symptom management and diagnosis given to patient on AVS.  Continue to follow with Crecencio Mc, MD for routine health maintenance.   Leah Holmes and I agreed with plan.   Mable Paris, FNP

## 2021-03-10 NOTE — ED Provider Notes (Signed)
[facility] Emergency Department Provider Note   ____________________________________________   I have reviewed the triage vital signs and the nursing notes.   HISTORY  Chief Complaint Back Pain   History limited by: Not Limited   HPI Leah Holmes is a 87 y.o. female who presents to the emergency department today because of concern for continued back pain. The patient states that the pain started 5 days ago when she woke up. She denies any trauma. Denies any unusual exertion. The pain is located primarily in her left lower back but she does state that it radiates across her lower back. The patient also has some pain going up her left flank and down her left leg. She has had some constipation recently which she attributes to being placed on tramadol. The tramadol has not helped with the pain. She has not noticed any change in her urine. The patient has had some chills. Denies similar pain in the past.   Records reviewed. Per medical record review patient has a history of atrial fibrillation. ER visit 5 days ago for low back pain.   Past Medical History:  Diagnosis Date   Atrial fibrillation ([place]) 2012   Breast cancer ([place]) 04/2018   IDC of left breast    CHF (congestive heart failure) ([place])    Colon polyp 2003   Coronary artery disease    COVID-19 virus infection 12/25/2019   Diabetes mellitus without complication (HCC)    Dyspnea    Dysrhythmia    atrial fib   Environmental and seasonal allergies    GERD (gastroesophageal reflux disease)    Hyperlipidemia    Hypertension    Irritable bowel syndrome    Multiple rib fractures 03/15/2018   Personal history of radiation therapy    Polyp of colon    PONV (postoperative nausea and vomiting)    with ether, not recently "woke up in surgery once"   Presence of permanent cardiac pacemaker    Tremor    Tremors of nervous system    Ulcer     Patient Active Problem List   Diagnosis Date Noted    Back pain 03/10/2021   Myalgia due to statin 09/15/2020   Acquired thrombophilia ([place]) 12/25/2019   Weight loss, unintentional 12/25/2019   Closed rib fracture 09/08/2019   S/P placement of cardiac pacemaker 09/08/2019   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 09/08/2019   Pruritus 09/08/2019   Invasive ductal carcinoma of breast, female, left ([place]) 04/26/2018   Bilateral carotid artery stenosis 04/20/2018   Aortic atherosclerosis ([place]) 03/15/2018   Balance problem 03/15/2018   Personal history of fall, presenting hazards to health 03/15/2018   Mass of upper inner quadrant of left breast 03/14/2018   Fatigue 10/27/2017   Intention tremor 04/15/2017   Osteoporosis 03/21/2016   Sick sinus syndrome ([place]) 01/28/2016   Other malaise 11/01/2015   Major depressive disorder, recurrent episode ([place]) 10/30/2015   Constipation 07/27/2015   Generalized anxiety disorder 04/02/2015   Ischemic chest pain ([place]) 01/08/2015   Chronic atrial fibrillation ([place]) 08/16/2014   Medicare annual wellness visit, subsequent 05/23/2014   Hyperlipidemia, mixed 05/08/2014   Moderate mitral insufficiency 05/08/2014   Moderate tricuspid insufficiency 05/08/2014   Dry mouth 03/10/2014   Low serum vitamin D 02/16/2014   LVH (left ventricular hypertrophy) 11/23/2013   Anal polyp 09/20/2013   Personal history of colonic polyps 08/31/2013   CAD (coronary artery disease) 03/09/2013   DM type 2 with diabetic peripheral neuropathy ([place]) 03/09/2013  Hyperlipidemia associated with type 2 diabetes mellitus ([place]) 03/09/2013   Essential hypertension, benign 03/09/2013   Nonulcer dyspepsia 03/09/2013    Past Surgical History:  Procedure Laterality Date   ABDOMINAL ADHESION SURGERY     ABDOMINAL HYSTERECTOMY     APPENDECTOMY     BREAST BIOPSY Right late 80s/early 90s   benign   BREAST BIOPSY Left 03/31/2018   [place] bx done with Dr. Bary Castilla, invasive ductal carcinoma   BREAST LUMPECTOMY Left 04/22/2018   Procedure:  BREAST LUMPECTOMY;  Surgeon: Robert Bellow, MD;  Location: [facility] ORS;  Service: General;  Laterality: Left;   BREAST LUMPECTOMY Left 04/22/2018   [place], negative LN, clear margins   BREAST LUMPECTOMY Left 2020   positive, done in office   BREAST SURGERY Right    biopsy    CESAREAN SECTION     x 2   CHOLECYSTECTOMY  03/2012   Dr Burt Knack   COLONOSCOPY  2003   Dr Theodosia Paling in [place]   fibroid tumor     [place] N/A 01/28/2016   Procedure: INSERTION PACEMAKER;  Surgeon: Isaias Cowman, MD;  Location: [facility] ORS;  Service: Cardiovascular;  Laterality: N/A;   SENTINEL NODE BIOPSY Left 04/22/2018   Procedure: SENTINEL NODE BIOPSY;  Surgeon: Robert Bellow, MD;  Location: [facility] ORS;  Service: General;  Laterality: Left;   UPPER GI ENDOSCOPY  2003   Dr Theodosia Paling in [place]    Prior to Admission medications   Medication Sig Start Date End Date Taking? Authorizing Provider  albuterol (PROAIR HFA) 108 (90 Base) MCG/ACT inhaler Inhale 1-2 puffs into the lungs every 4 (four) hours as needed for wheezing or shortness of breath. Patient not taking: Reported on 03/10/2021 11/17/17   Laverle Hobby, MD  amLODipine (NORVASC) 5 MG tablet TAKE ONE TABLET (5 mg) BY MOUTH every morning 11/22/19   Crecencio Mc, MD  atenolol (TENORMIN) 50 MG tablet Take 1 tablet by mouth twice daily 12/23/20   Crecencio Mc, MD  Cholecalciferol (VITAMIN D3) 1000 UNITS CAPS Take 1,000 Units by mouth every morning.     [provider]  glucose blood (ONE TOUCH ULTRA TEST) test strip USE 1 STRIP TO CHECK GLUCOSE TWICE DAILY  Dx code: E11.9 05/29/19   Crecencio Mc, MD  glucose blood (ONE TOUCH ULTRA TEST) test strip USE ONE STRIP TO CHECK GLUCOSE ONCE  DAILY 06/02/19   Crecencio Mc, MD  hydrochlorothiazide (HYDRODIURIL) 25 MG tablet Take 25 mg by mouth daily.  12/12/15   [provider]  ipratropium (ATROVENT) 0.03 % nasal spray Place 2 sprays into both nostrils every 12 (twelve) hours.  02/23/20   Crecencio Mc, MD  isosorbide mononitrate (IMDUR) 60 MG 24 hr tablet Take 60 mg by mouth 2 (two) times daily. 12/27/15   [provider]  Lancets (ONETOUCH DELICA PLUS ZOXWRU04V) [place] 2 Devices into the skin daily. DX Code E11.9. 01/30/19   Crecencio Mc, MD  lidocaine (LIDODERM) 5 % Place 1 patch onto the skin every 12 (twelve) hours as needed for up to 10 days. Remove & Discard patch after 12 hours of wear each day. Patient not taking: Reported on 03/10/2021 03/05/21 03/15/21  Menshew, Dannielle Karvonen, PA-C  lisinopril (ZESTRIL) 20 MG tablet Take 2 tablets by mouth twice daily 02/11/21   Crecencio Mc, MD  meclizine (ANTIVERT) 25 MG tablet Take 1 tablet by mouth three times daily as needed 06/24/20   Crecencio Mc, MD  mometasone (  ELOCON) 0.1 % cream Apply once ot twice daily as needed for itching 09/13/20   Crecencio Mc, MD  nitroGLYCERIN (NITROSTAT) 0.4 MG SL tablet Place 0.4 mg under the tongue every 5 (five) minutes as needed for chest pain.    [provider]  omeprazole (PRILOSEC) 20 MG capsule Take 1 capsule by mouth once daily 05/30/20   Crecencio Mc, MD  [facility] ULTRA test strip USE 1 STRIP TO CHECK GLUCOSE TWICE DAILY 05/15/19   Crecencio Mc, MD  Polyethyl Glycol-Propyl Glycol 0.4-0.3 % SOLN Place 1 drop into both eyes 3 (three) times daily as needed (for dry/irritated eyes.).    [provider]  polyethylene glycol (MIRALAX / GLYCOLAX) 17 g packet Take 17 g by mouth daily as needed.    [provider]  psyllium (METAMUCIL) 58.6 % packet Take 1 packet by mouth daily as needed (for regularity).     [provider]  traMADol (ULTRAM) 50 MG tablet Take 1 tablet (50 mg total) by mouth 2 (two) times daily for 5 days. 03/05/21 03/10/21  Menshew, Dannielle Karvonen, PA-C  triamcinolone cream (KENALOG) 0.1 % APPLY 1 APPLICATION TOPICALLY TWICE DAILY 04/18/20   Crecencio Mc, MD  warfarin (COUMADIN) 2 MG tablet Take 2 mg by mouth 2  (two) times a week. Take 2mg  at bedtime on Wednesday & Sunday. 01/29/20   [provider]  warfarin (COUMADIN) 4 MG tablet Take 4 mg by mouth daily at 6 PM. Does not take Wednesday or Sunday.    [provider]    Allergies Codeine, Penicillins, Amlodipine, Atorvastatin, Clonidine derivatives, Gabapentin, Losartan, Morphine, Morphine and related, Reglan [metoclopramide], and Hydralazine  Family History  Problem Relation Age of Onset   Breast cancer Sister    Cancer Sister 68       breast   Breast cancer Maternal Aunt 68   Breast cancer Cousin    Breast cancer Other 28    Social History Social History   Tobacco Use   Smoking status: Never   Smokeless tobacco: Former    Types: Snuff   Tobacco comments:    occasionally  Vaping Use   Vaping Use: Never used  Substance Use Topics   Alcohol use: No   Drug use: No    Review of Systems Constitutional: No fever/chills Eyes: No visual changes. ENT: No sore throat. Cardiovascular: Denies chest pain. Respiratory: Denies shortness of breath. Gastrointestinal: Positive for left lower abdominal pain.  Positive for constipation. Genitourinary: Negative for dysuria. Musculoskeletal: Positive for low back pain. Skin: Negative for rash. Neurological: Negative for headaches, focal weakness or numbness.  ____________________________________________   PHYSICAL EXAM:  VITAL SIGNS: ED Triage Vitals  Enc Vitals Group     BP 03/10/21 1255 (!) 228/100     Pulse Rate 03/10/21 1253 63     Resp 03/10/21 1253 14     Temp 03/10/21 1253 97.7 F (36.5 C)     Temp Source 03/10/21 1253 Oral     SpO2 03/10/21 1253 97 %     Weight --      Height --      Head Circumference --      Peak Flow --      Pain Score 03/10/21 1257 7    Constitutional: Alert and oriented.  Eyes: Conjunctivae are normal.  ENT      Head: Normocephalic and atraumatic.      Nose: No congestion/rhinnorhea.      Mouth/Throat: Mucous membranes are  moist.      Neck: No stridor. Hematological/Lymphatic/Immunilogical: No cervical lymphadenopathy. Cardiovascular: Normal rate, regular rhythm.  No murmurs, rubs, or gallops.  Respiratory: Normal respiratory effort without tachypnea nor retractions. Breath sounds are clear and equal bilaterally. No wheezes/rales/rhonchi. Gastrointestinal: Soft and non tender. No rebound. No guarding.  Genitourinary: Deferred Musculoskeletal: Tender to palpation in lower back. Neurologic:  Normal speech and language. No gross focal neurologic deficits are appreciated.  Skin:  Skin is warm, dry and intact. No rash noted. Psychiatric: Mood and affect are normal. Speech and behavior are normal. Patient exhibits appropriate insight and judgment.  ____________________________________________    LABS (pertinent positives/negatives)  Lipase 40 CBC wbc 4.8, hgb 16.3, plt 193 CMP wnl except na 134, glu 190 UA clear, moderate hgb dipstick, ketones 5, protein 30, 0-5 rbc and wbc ____________________________________________   EKG  I, Nance Pear, attending physician, personally viewed and interpreted this EKG  EKG Time: 1523 Rate: 67 Rhythm: atrial fibrillation with some ventricular paced beats Axis: left axis deviation Intervals: qtc 518 QRS: LVH, wide ST changes: no st elevation Impression: abnormal ekg  ____________________________________________    RADIOLOGY  CT abd/pel Acute/subacute L2 compression fracture.   ____________________________________________   PROCEDURES  Procedures  ____________________________________________   INITIAL IMPRESSION / ASSESSMENT AND PLAN / ED COURSE  Pertinent labs & imaging results that were available during my care of the patient were reviewed by me and considered in my medical decision making (see chart for details).   Patient presents to the emergency department today with concern for severe low back pain. Had been evaluated recently in ED with  negative x-ray. CT scan was obtained today consistent with L2 compression fracture. Do think this would explain the patients pain. Additionally patient was hypertensive here in the emergency department. The patient also was found to have elevated blood pressure here. She was given her home blood pressure medication however continued to be hypertensive. Will plan on admission. Discussed findings and plan with patient and family member.   ____________________________________________   FINAL CLINICAL IMPRESSION(S) / ED DIAGNOSES  Final diagnoses:  Closed compression fracture of L2 lumbar vertebra, initial encounter ([place])  Hypertension, unspecified type     Note: This dictation was prepared with Dragon dictation. Any transcriptional errors that result from this process are unintentional     Nance Pear, MD 03/10/21 2019

## 2021-03-11 DIAGNOSIS — S32020A Wedge compression fracture of second lumbar vertebra, initial encounter for closed fracture: Secondary | ICD-10-CM

## 2021-03-11 DIAGNOSIS — I160 Hypertensive urgency: Secondary | ICD-10-CM | POA: Diagnosis not present

## 2021-03-11 DIAGNOSIS — I480 Paroxysmal atrial fibrillation: Secondary | ICD-10-CM

## 2021-03-11 LAB — CBC
HCT: 45 % (ref 36.0–46.0)
Hemoglobin: 15.5 g/dL — ABNORMAL HIGH (ref 12.0–15.0)
MCH: 32 pg (ref 26.0–34.0)
MCHC: 34.4 g/dL (ref 30.0–36.0)
MCV: 93 fL (ref 80.0–100.0)
Platelets: 208 10*3/uL (ref 150–400)
RBC: 4.84 MIL/uL (ref 3.87–5.11)
RDW: 13 % (ref 11.5–15.5)
WBC: 5.4 10*3/uL (ref 4.0–10.5)
nRBC: 0 % (ref 0.0–0.2)

## 2021-03-11 LAB — BASIC METABOLIC PANEL
Anion gap: 14 (ref 5–15)
BUN: 14 mg/dL (ref 8–23)
CO2: 26 mmol/L (ref 22–32)
Calcium: 9.4 mg/dL (ref 8.9–10.3)
Chloride: 96 mmol/L — ABNORMAL LOW (ref 98–111)
Creatinine, Ser: 0.66 mg/dL (ref 0.44–1.00)
GFR, Estimated: >60 mL/min (ref >60)
Glucose, Bld: 151 mg/dL — ABNORMAL HIGH (ref 70–99)
Potassium: 3.3 mmol/L — ABNORMAL LOW (ref 3.5–5.1)
Sodium: 136 mmol/L (ref 135–145)

## 2021-03-11 LAB — PROTIME-INR
INR: 3.2 — ABNORMAL HIGH (ref 0.8–1.2)
Prothrombin Time: 33.1 seconds — ABNORMAL HIGH (ref 11.4–15.2)

## 2021-03-11 LAB — SARS CORONAVIRUS 2 (TAT 6-24 HRS): SARS Coronavirus 2: NEGATIVE

## 2021-03-11 MED ORDER — ALPRAZOLAM 0.25 MG PO TABS
0.2500 mg | ORAL_TABLET | Freq: Three times a day (TID) | ORAL | Status: DC | PRN
  Administered 2021-03-11 – 2021-03-13 (×3): 0.25 mg via ORAL
  Filled 2021-03-11 (×3): qty 1

## 2021-03-11 MED ORDER — PHENOL 1.4 % MT LIQD
1.0000 | OROMUCOSAL | Status: DC | PRN
  Filled 2021-03-11: qty 177

## 2021-03-11 MED ORDER — DOCUSATE SODIUM 100 MG PO CAPS
200.0000 mg | ORAL_CAPSULE | Freq: Two times a day (BID) | ORAL | Status: DC
  Administered 2021-03-11 – 2021-03-14 (×7): 200 mg via ORAL
  Filled 2021-03-11 (×7): qty 2

## 2021-03-11 MED ORDER — ISOSORBIDE MONONITRATE ER 30 MG PO TB24
60.0000 mg | ORAL_TABLET | Freq: Two times a day (BID) | ORAL | Status: DC
  Administered 2021-03-11 – 2021-03-14 (×7): 60 mg via ORAL
  Filled 2021-03-11: qty 1
  Filled 2021-03-11: qty 2
  Filled 2021-03-11: qty 1
  Filled 2021-03-11 (×2): qty 2
  Filled 2021-03-11: qty 1
  Filled 2021-03-11: qty 2

## 2021-03-11 MED ORDER — POLYETHYLENE GLYCOL 3350 17 G PO PACK
17.0000 g | PACK | Freq: Every day | ORAL | Status: DC
  Administered 2021-03-11 – 2021-03-14 (×4): 17 g via ORAL
  Filled 2021-03-11 (×4): qty 1

## 2021-03-11 MED ORDER — WARFARIN SODIUM 2 MG PO TABS
2.0000 mg | ORAL_TABLET | Freq: Once | ORAL | Status: AC
  Administered 2021-03-11: 2 mg via ORAL
  Filled 2021-03-11: qty 1

## 2021-03-11 NOTE — Progress Notes (Signed)
Orthopedic Tech Progress Note Patient Details:  Leah Holmes [birth year] [id number]  RN called this morning stating " patient has back brace, but needs to be adjusted better".. so I called to [facility] to come out and make those adjustments    Patient ID: Leah Holmes, female   DOB: [birth year], 87 y.o.   MRN: [id number]  Leah Holmes 03/11/2021, 12:11 PM

## 2021-03-11 NOTE — ED Notes (Addendum)
Patient keeps yelling out help. Patient is focused on the TSLO brace. An ortho tech came out earlier today to readjust brace. Patient has been explained what the brace is for and why she needs to keep it on multiple times throughout shift. Patient is pulling at the attachments and stating it is choking her. But patient was able to swallow pills without difficulty. MD notified, awaiting orders.

## 2021-03-11 NOTE — ED Notes (Signed)
Called for ortho tech for brace to be adjusted  will be here shortly  1202

## 2021-03-11 NOTE — ED Notes (Signed)
Patient states she does not want any more pain medications. Patient states her pain is related to the brace not being fitted correctly and the pressure it causes.

## 2021-03-11 NOTE — ED Notes (Signed)
Pt placed on monitor.  

## 2021-03-11 NOTE — Progress Notes (Signed)
PROGRESS NOTE   HPI was taken from Dr. Tobie Poet: Leah Holmes is a 87 y.o. female with medical history significant for hypertension, atrial fibrillation, chronic pain, GERD, constipation, who presents to the emergency department for chief concerns of back pain.   She states that her back pain started today.  She states that in the last 2 weeks she fell on her bed several times..  She states at that time her back did not hurt very much.  However, over the last week, she has been having worsening back pain.  She states that the pain is all over.  She states that the location of the pain is at the mid back. At bedside she is able to tell me her name, her age, her current location, the current calendar year.  She is aware that she is in the hospital.  She states that her daughter just left.  She was able to flex and extend her arms without difficulty, bend her knees bilaterally and move her legs without difficulty.  She was able to wiggle her toes.  Her face did not exhibit any asymmetry and there was no slurring of speech.   She does have upper extremity tremors and she states that this is normal for her.  She denies fever, nausea, vomiting, chest pain, shortness of breath, abdominal pain, dysuria, hematuria, diarrhea.  She states that the TLSO brace is placed too tight.   I ask if she would like me to call a tech to assist with readjusting the brace and she yells at me know.  She does not want anyone to touch it tonight.  She wants to be left alone.   Leah Holmes  ACZ:[id number] DOB: December 11, 1926 DOA: 03/10/2021 PCP: Crecencio Mc, MD   Assessment & Plan:   Principal Problem:   Hypertensive urgency Active Problems:   CAD (coronary artery disease)   DM type 2 with diabetic peripheral neuropathy ([place])   Hyperlipidemia associated with type 2 diabetes mellitus ([place])   Essential hypertension, benign   Chronic atrial fibrillation (HCC)   Generalized anxiety disorder    Constipation   Major depressive disorder, recurrent episode ([place])   Sick sinus syndrome ([place])   Fatigue   Invasive ductal carcinoma of breast, female, left (HCC)   Hyperlipidemia, mixed   LVH (left ventricular hypertrophy)   S/P placement of cardiac pacemaker   Back pain   Hypertensive urgency: possibly secondary to severe pain as stated below. Hx of HTN. Continue on atenolol, imdur, lisinopril, HCTZ, amlodipine and continue on IV cardene drip and wean as tolerated   Acute/subacute compression fracture: involving superior endplate of L2 with approximate 30% loss of height. Continue TLSO brace. Recommend outpatient follow-up with orthopedic for vertebroplasty. Consider ortho surg consult if pain does not improve. Unlikely a surgical candidate secondary to pt's age. Continue w/ fall precautions   QT prolongation: continue to monitor on tele    Likely PAF: continue on atenolol, warfarin. Pharmacy to monitor & dose warfarin    Chronic pain: continue on home dose of tramadol   Hx of breast cancer: s/p lumpectomy in Oct 2021. Unable to tolerate chemo and s/p radiation. Management per onco outpatient    DVT prophylaxis: warfarin  Code Status full  Family Communication: discussed pt's care w/ pt's family at bedside and answered their questions  Disposition Plan: depends on PT/OT recs (not consulted yet)   Level of care: Stepdown  Status is: Inpatient  Remains inpatient appropriate because:Unsafe d/c plan, IV treatments  appropriate due to intensity of illness or inability to take PO, and Inpatient level of care appropriate due to severity of illness  Dispo: The patient is from: home               Anticipated d/c is to: SNF              Patient currently is not medically stable to d/c.   Difficult to place patient : unclear   Consultants:    Procedures:  Antimicrobials:    Subjective: Pt c/o back pain   Objective: Vitals:   03/11/21 0430 03/11/21 0530 03/11/21 0600  03/11/21 0630  BP: 128/63 135/67 131/69 139/69  Pulse: 71 68 63 69  Resp: 19 15 15 15   Temp:      TempSrc:      SpO2: 93% 93% 94% 96%    Intake/Output Summary (Last 24 hours) at 03/11/2021 2585 Last data filed at 03/11/2021 0050 Gross per 24 hour  Intake --  Output 800 ml  Net -800 ml   There were no vitals filed for this visit.  Examination:  General exam: Appears uncomfortable. Frail appearing  Respiratory system: Clear to auscultation. Respiratory effort normal. Cardiovascular system: S1 & S2 +. No  rubs, gallops or clicks.  Gastrointestinal system: Abdomen is nondistended, soft and nontender. Normal bowel sounds heard. Central nervous system: Alert and oriented. Moves all extremities  Psychiatry: Judgement and insight appear normal. Appears frustrated     Data Reviewed: I have personally reviewed following labs and imaging studies  CBC: Recent Labs  Lab 03/10/21 1455 03/11/21 0750  WBC 4.8 5.4  NEUTROABS 3.4  --   HGB 16.3* 15.5*  HCT 47.0* 45.0  MCV 93.8 93.0  PLT 193 277   Basic Metabolic Panel: Recent Labs  Lab 03/10/21 1455  NA 134*  K 3.6  CL 98  CO2 26  GLUCOSE 190*  BUN 12  CREATININE 0.58  CALCIUM 9.3   GFR: Estimated Creatinine Clearance: 40.8 mL/min (by C-G formula based on SCr of 0.58 mg/dL). Liver Function Tests: Recent Labs  Lab 03/10/21 1455  AST 27  ALT 21  ALKPHOS 68  BILITOT 1.1  PROT 7.0  ALBUMIN 3.7   Recent Labs  Lab 03/10/21 1455  LIPASE 40   No results for input(s): AMMONIA in the last 168 hours. Coagulation Profile: Recent Labs  Lab 03/10/21 2150  INR 3.3*   Cardiac Enzymes: No results for input(s): CKTOTAL, CKMB, CKMBINDEX, TROPONINI in the last 168 hours. BNP (last 3 results) No results for input(s): PROBNP in the last 8760 hours. HbA1C: No results for input(s): HGBA1C in the last 72 hours. CBG: No results for input(s): GLUCAP in the last 168 hours. Lipid Profile: No results for input(s): CHOL, HDL,  LDLCALC, TRIG, CHOLHDL, LDLDIRECT in the last 72 hours. Thyroid Function Tests: Recent Labs    03/10/21 2150  TSH 1.187   Anemia Panel: No results for input(s): VITAMINB12, FOLATE, FERRITIN, TIBC, IRON, RETICCTPCT in the last 72 hours. Sepsis Labs: No results for input(s): PROCALCITON, LATICACIDVEN in the last 168 hours.  No results found for this or any previous visit (from the past 240 hour(s)).       Radiology Studies: CT ABDOMEN PELVIS W CONTRAST  Result Date: 03/10/2021 CLINICAL DATA:  Left abdominal pain.  Back pain. EXAM: CT ABDOMEN AND PELVIS WITH CONTRAST TECHNIQUE: Multidetector CT imaging of the abdomen and pelvis was performed using the standard protocol following bolus administration of intravenous contrast. CONTRAST:  61mL  OMNIPAQUE IOHEXOL 350 MG/ML SOLN COMPARISON:  Chest CTA 03/07/2018 and 01/11/2018 FINDINGS: Lower chest: Multiple small sub solid opacities in the periphery of the lower lungs. Largest opacity measures roughly 5 mm on sequence 4, image 14. Some of these peripheral opacities are chronic since 2019 but others may be new. Cardiac lead in the right ventricle. Trace pleural fluid versus minimal pleural thickening, left side greater than right. Hepatobiliary: Small focal area of enhancement in the lateral left hepatic lobe is unchanged since 2019 and most compatible with an incidental finding. Cholecystectomy. No biliary dilatation. Pancreas: Unremarkable. No pancreatic ductal dilatation or surrounding inflammatory changes. Spleen: Normal in size without focal abnormality. Adrenals/Urinary Tract: Normal adrenal glands. Bilateral renal cysts. Normal appearance of the urinary bladder. No hydronephrosis. Stomach/Bowel: Question polyp or ingested tablet in the sigmoid colon on sequence 2 image 62. Normal appearance of the stomach. No evidence for bowel inflammation. No evidence for a bowel obstruction. Vascular/Lymphatic: No abdominopelvic lymph node enlargement. Diffuse  atherosclerotic calcifications. No abdominal aortic aneurysm. Reproductive: Status post hysterectomy. No adnexal masses. Other: Trace pelvic free fluid. Laxity and irregularity along the anterior abdominal wall is likely postoperative in etiology. Musculoskeletal: Compression fracture involving the superior endplate of L2. This is most compatible with an acute/subacute fracture. No significant bone retropulsion. Approximately 30% loss of height. IMPRESSION: 1. Acute/subacute compression fracture involving the superior endplate of L2. Approximately 30% loss of height. 2. Trace free fluid in the pelvis. There was a similar finding on a exam from 2019. These findings are nonspecific but could be associated with an occult inflammatory process in the abdomen and pelvis. 3. Bilateral renal cysts. 4. Small peripheral opacities at both lung bases. Some of these are chronic. These small opacities are likely postinflammatory in etiology. No follow-up needed if patient is low-risk (and has no known or suspected primary neoplasm). Non-contrast chest CT can be considered in 12 months if patient is high-risk. This recommendation follows the consensus statement: Guidelines for Management of Incidental Pulmonary Nodules Detected on CT Images: From the Fleischner Society 2017; Radiology 2017; 284:228-243. Electronically Signed   By: Markus Daft M.D.   On: 03/10/2021 17:23        Scheduled Meds:  amLODipine  10 mg Oral Daily   atenolol  50 mg Oral BID   hydrochlorothiazide  25 mg Oral Daily   ipratropium  2 spray Each Nare Q12H   isosorbide mononitrate  60 mg Oral BID   lisinopril  40 mg Oral BID   pantoprazole  40 mg Oral Daily   traMADol  50 mg Oral BID   [START ON 03/12/2021] warfarin  2 mg Oral Once per day on Sun Wed   warfarin  4 mg Oral Once per day on Mon Tue Thu Fri Sat   Warfarin - Pharmacist Dosing Inpatient   Does not apply q1600   Continuous Infusions:  niCARDipine 3 mg/hr (03/11/21 0211)     LOS: 1  day    Time spent: 33 mins    Wyvonnia Dusky, MD [facility] Pager [phone]  If 7PM-7AM, please contact night-coverage 03/11/2021, 8:08 AM

## 2021-03-11 NOTE — Consult Note (Signed)
ANTICOAGULATION CONSULT NOTE - Follow Up Consult  Pharmacy Consult for  Warfarin Indication: atrial fibrillation  Allergies  Allergen Reactions   Codeine Anaphylaxis   Penicillins Anaphylaxis    Has patient had a PCN reaction causing immediate rash, facial/tongue/throat swelling, SOB or lightheadedness with hypotension: Yes Has patient had a PCN reaction causing severe rash involving mucus membranes or skin necrosis: No Has patient had a PCN reaction that required hospitalization Yes Has patient had a PCN reaction occurring within the last 10 years: No If all of the above answers are "NO", then may proceed with Cephalosporin use.   Amlodipine Other (See Comments)    Diffuse itching    Atorvastatin     Other reaction(s): Unknown   Clonidine Derivatives Other (See Comments)    Reaction:  Unknown    Gabapentin Nausea Only   Losartan Other (See Comments)    Other reaction(s): Other (See Comments) Reaction:  Muscle aches  Reaction:  Muscle aches    Morphine     Other reaction(s): Unknown   Morphine And Related Other (See Comments)    Reaction:  Unknown    Reglan [Metoclopramide] Other (See Comments)    Reaction:  Tremors    Hydralazine Anxiety    Patient Measurements:  Vital Signs: Temp: 97.6 F (36.4 C) (09/27 0817) Temp Source: Oral (09/27 0817) BP: 148/69 (09/27 0930) Pulse Rate: 83 (09/27 0930)  Labs: Recent Labs    03/10/21 1455 03/10/21 2150 03/11/21 0750  HGB 16.3*  --  15.5*  HCT 47.0*  --  45.0  PLT 193  --  208  APTT  --  76*  --   LABPROT  --  33.2* 33.1*  INR  --  3.3* 3.2*  CREATININE 0.58  --  0.66     Estimated Creatinine Clearance: 40.8 mL/min (by C-G formula based on SCr of 0.66 mg/dL).   Medications:  PTA warfarin 4 mg daily at 6 pm (except wednesday or Sunday) and 2 mg twice per week on Wednesday and Sunday  Assessment: 87yo Female w/ h/o CHF, Afib, breast cancer, CAD, HLD, HTN, & ulcers presenting from clinic with CC of back pain. CT  scan c/w L2 Compression fracture. Pharmacy consulted for the mgmt of warfarin dosing while inpatient.   INR remains supratherapeutic with INR goal range of 2-3. No current DDI's at this time.  Date Time INR Dose/Comment 9/24 2000 Unk PTA dose 4mg  9/25 2000 Unk PTA dose 2mg  9/26  2150 3.3  2mg  x1 9/27    0827    3.2       2mg  x1   Goal of Therapy:  INR 2-3 Monitor platelets by anticoagulation protocol: Yes   Plan:  Continue warfarin 2mg  today Plan to resume home regimen once INR 3 or lower Daily INR x 5 already ordered by provider CBC stable Will continue daily follow up and warfarin adjustment as needed  Leah Holmes PharmD, BCPS 03/11/2021 10:52 AM

## 2021-03-11 NOTE — ED Notes (Signed)
Tech here to adjust brace.

## 2021-03-12 ENCOUNTER — Telehealth: Payer: Self-pay | Admitting: Internal Medicine

## 2021-03-12 DIAGNOSIS — S32020A Wedge compression fracture of second lumbar vertebra, initial encounter for closed fracture: Secondary | ICD-10-CM

## 2021-03-12 DIAGNOSIS — S32020S Wedge compression fracture of second lumbar vertebra, sequela: Secondary | ICD-10-CM

## 2021-03-12 DIAGNOSIS — I160 Hypertensive urgency: Secondary | ICD-10-CM | POA: Diagnosis not present

## 2021-03-12 DIAGNOSIS — I480 Paroxysmal atrial fibrillation: Secondary | ICD-10-CM | POA: Insufficient documentation

## 2021-03-12 DIAGNOSIS — R9431 Abnormal electrocardiogram [ECG] [EKG]: Secondary | ICD-10-CM

## 2021-03-12 DIAGNOSIS — C50912 Malignant neoplasm of unspecified site of left female breast: Secondary | ICD-10-CM

## 2021-03-12 DIAGNOSIS — E876 Hypokalemia: Secondary | ICD-10-CM

## 2021-03-12 LAB — BASIC METABOLIC PANEL
Anion gap: 9 (ref 5–15)
BUN: 12 mg/dL (ref 8–23)
CO2: 30 mmol/L (ref 22–32)
Calcium: 9.5 mg/dL (ref 8.9–10.3)
Chloride: 97 mmol/L — ABNORMAL LOW (ref 98–111)
Creatinine, Ser: 0.53 mg/dL (ref 0.44–1.00)
GFR, Estimated: >60 mL/min (ref >60)
Glucose, Bld: 148 mg/dL — ABNORMAL HIGH (ref 70–99)
Potassium: 3.1 mmol/L — ABNORMAL LOW (ref 3.5–5.1)
Sodium: 136 mmol/L (ref 135–145)

## 2021-03-12 LAB — CBC
HCT: 46.1 % — ABNORMAL HIGH (ref 36.0–46.0)
Hemoglobin: 15.4 g/dL — ABNORMAL HIGH (ref 12.0–15.0)
MCH: 31 pg (ref 26.0–34.0)
MCHC: 33.4 g/dL (ref 30.0–36.0)
MCV: 92.9 fL (ref 80.0–100.0)
Platelets: 220 10*3/uL (ref 150–400)
RBC: 4.96 MIL/uL (ref 3.87–5.11)
RDW: 12.9 % (ref 11.5–15.5)
WBC: 5.9 10*3/uL (ref 4.0–10.5)
nRBC: 0 % (ref 0.0–0.2)

## 2021-03-12 LAB — PROTIME-INR
INR: 4 — ABNORMAL HIGH (ref 0.8–1.2)
Prothrombin Time: 38.7 seconds — ABNORMAL HIGH (ref 11.4–15.2)

## 2021-03-12 MED ORDER — POTASSIUM CHLORIDE CRYS ER 20 MEQ PO TBCR
20.0000 meq | EXTENDED_RELEASE_TABLET | Freq: Once | ORAL | Status: AC
  Administered 2021-03-12: 20 meq via ORAL
  Filled 2021-03-12: qty 1

## 2021-03-12 MED ORDER — TRAZODONE HCL 50 MG PO TABS
25.0000 mg | ORAL_TABLET | Freq: Every evening | ORAL | Status: DC | PRN
  Administered 2021-03-12: 25 mg via ORAL
  Filled 2021-03-12: qty 1

## 2021-03-12 MED ORDER — SPIRONOLACTONE 25 MG PO TABS
12.5000 mg | ORAL_TABLET | Freq: Every day | ORAL | Status: DC
Start: 2021-03-12 — End: 2021-03-12

## 2021-03-12 NOTE — ED Notes (Signed)
RN aware bed assigned ?

## 2021-03-12 NOTE — ED Notes (Addendum)
Pt can be heard from her room yelling "help me". Her call bell in hand. She states that she needs to get up to go to the restroom - pt informed that she has a purewick in place and its safer for her to remain in bed. Pt became increasingly agitated but was easily deescalated. Pt allowed this RN to apply her back brace. Pt denies pain meds at this time.

## 2021-03-12 NOTE — ED Notes (Signed)
Pt bed alarm going off re directed pt and placed back brace back on. Placed her on [facility] and then back in bed. Alarm reset

## 2021-03-12 NOTE — ED Notes (Signed)
Pt removed back brace and refusing to keep on. Mittens applied due to alter state by previous medication administered. Pts RN and Probation officer in room to help apply brace and mittens as well as insuring fall alarm in place.

## 2021-03-12 NOTE — Consult Note (Signed)
ANTICOAGULATION CONSULT NOTE - Follow Up Consult  Pharmacy Consult for  Warfarin Indication: atrial fibrillation  Allergies  Allergen Reactions   Codeine Anaphylaxis   Penicillins Anaphylaxis    Has patient had a PCN reaction causing immediate rash, facial/tongue/throat swelling, SOB or lightheadedness with hypotension: Yes Has patient had a PCN reaction causing severe rash involving mucus membranes or skin necrosis: No Has patient had a PCN reaction that required hospitalization Yes Has patient had a PCN reaction occurring within the last 10 years: No If all of the above answers are "NO", then may proceed with Cephalosporin use.   Amlodipine Other (See Comments)    Diffuse itching    Atorvastatin     Other reaction(s): Unknown   Clonidine Derivatives Other (See Comments)    Reaction:  Unknown    Gabapentin Nausea Only   Losartan Other (See Comments)    Other reaction(s): Other (See Comments) Reaction:  Muscle aches  Reaction:  Muscle aches    Morphine     Other reaction(s): Unknown   Morphine And Related Other (See Comments)    Reaction:  Unknown    Reglan [Metoclopramide] Other (See Comments)    Reaction:  Tremors    Hydralazine Anxiety    Patient Measurements:  Vital Signs: Temp: 98.1 F (36.7 C) (09/28 1153) Temp Source: Oral (09/28 1153) BP: 155/65 (09/28 1153) Pulse Rate: 74 (09/28 1153)  Labs: Recent Labs    03/10/21 1455 03/10/21 2150 03/11/21 0750 03/12/21 0642  HGB 16.3*  --  15.5* 15.4*  HCT 47.0*  --  45.0 46.1*  PLT 193  --  208 220  APTT  --  76*  --   --   LABPROT  --  33.2* 33.1* 38.7*  INR  --  3.3* 3.2* 4.0*  CREATININE 0.58  --  0.66 0.53     Estimated Creatinine Clearance: 40.8 mL/min (by C-G formula based on SCr of 0.53 mg/dL).   Medications:  PTA warfarin 4 mg daily at 6 pm (except wednesday or Sunday) and 2 mg twice per week on Wednesday and Sunday  Assessment: 87yo Female w/ h/o CHF, Afib, breast cancer, CAD, HLD, HTN, &  ulcers presenting from clinic with CC of back pain. CT scan c/w L2 Compression fracture. Pharmacy consulted for the mgmt of warfarin dosing while inpatient.   INR remains supratherapeutic with INR goal range of 2-3. No current DDI's at this time.  Date Time INR Dose/Comment 9/24 2000 Unk PTA dose 4mg  9/25 2000 Unk PTA dose 2mg  9/26  2150 3.3  2mg  x1 9/27    0827    3.2       2mg  x1 9/28    0727    4.0       HOLD   Goal of Therapy:  INR 2-3 Monitor platelets by anticoagulation protocol: Yes   Plan:  HOLD warfarin today Plan to resume home regimen once INR 3 or lower Daily INR x 5 already ordered by provider CBC stable Will continue daily follow up and warfarin adjustment as needed  Myrella Fahs Rodriguez-Guzman PharmD, BCPS 03/12/2021 12:32 PM

## 2021-03-12 NOTE — Evaluation (Signed)
hysical Therapy Evaluation Patient Details Name: Leah Holmes MRN: [id number] DOB: 05/20/1927 Today's Date: 03/12/2021  History of Present Illness  Leah Holmes is a 87 y.o. female with medical history significant for hypertension, atrial fibrillation, chronic pain, GERD, constipation, who presents to the emergency department for chief concerns of back pain. Patient with L2 compression fracture.   Clinical Impression  Patient received on stretcher, daughter at bedside, RN in room. Had long discussion regarding TLSO brace. Patient was unable to tolerate wearing it. Causing more discomfort than support. Patient reporting little to no pain at this time. Patient performed all mobility without brace. She performed bed mobility with mod independence, transfers with min guard and ambulated 100 feet with RW and min guard. Patient will benefit from PT follow up to ensure she is continuing to move well and will be safe to return home.         Recommendations for follow up therapy are one component of a multi-disciplinary discharge planning process, led by the attending physician.  Recommendations may be updated based on patient status, additional functional criteria and insurance authorization.  Follow Up Recommendations No PT follow up;Other (comment) (would have recommended HHPT for balance, but daughter states she won't want it)    Equipment Recommendations  None recommended by PT    Recommendations for Other Services       Precautions / Restrictions Precautions Precautions: Fall Precaution Comments: for some reason a TLSO was ordered for L2 compression Fx. Patient unable to tolerate this brace. Patient currently not reporting any pain, therefore did not have patient wear brace for evaluation. Discussed with RN, daughter and patient that if pain is to return she may benefit from a lumbar corset. Required Braces or Orthoses: Spinal Brace Spinal Brace: Thoracolumbosacral  orthotic Restrictions Weight Bearing Restrictions: No      Mobility  Bed Mobility Overal bed mobility: Modified Independent                  Transfers Overall transfer level: Needs assistance Equipment used: Rolling walker (2 wheeled) Transfers: Sit to/from Stand              Ambulation/Gait Ambulation/Gait assistance: Min guard Gait Distance (Feet): 100 Feet Assistive device: Rolling walker (2 wheeled) Gait Pattern/deviations: Step-through pattern;Narrow base of support;Decreased stride length Gait velocity: decr   General Gait Details: patient slightly unsteady with ambulation, reports she feels a bit dizzy, vision a little fuzzy. BP checked on returning to room and it was not low.  Stairs            Wheelchair Mobility    Modified Rankin (Stroke Patients Only)       Balance Overall balance assessment: Needs assistance;History of Falls Sitting-balance support: Feet supported Sitting balance-[name]: Good     Standing balance support: Bilateral upper extremity supported;During functional activity Standing balance-[name]: Fair Standing balance comment: relies on B UE support and min guard                             Pertinent Vitals/Pain Pain Assessment: No/denies pain    Home Living Family/patient expects to be discharged to:: Private residence Living Arrangements: Children Available Help at Discharge: Family;Available 24 hours/day           Home Equipment: Walker - 4 wheels      Prior Function Level of Independence: Independent with assistive device(s)         Comments: daughter  reports her mother is independent at home using rollator.     Hand Dominance        Extremity/Trunk Assessment   Upper Extremity Assessment Upper Extremity Assessment: Overall WFL for tasks assessed    Lower Extremity Assessment Lower Extremity Assessment: Overall WFL for tasks assessed    Cervical / Trunk  Assessment Cervical / Trunk Assessment: Kyphotic  Communication   Communication: HOH  Cognition Arousal/Alertness: Awake/alert Behavior During Therapy: WFL for tasks assessed/performed Overall Cognitive Status: Within Functional Limits for tasks assessed                                        General Comments      Exercises     Assessment/Plan    PT Assessment Patient needs continued PT services  PT Problem List Decreased strength;Decreased mobility;Decreased safety awareness;Decreased balance       PT Treatment Interventions Therapeutic activities;Gait training;Therapeutic exercise;Functional mobility training;Balance training;Patient/family education    PT Goals (Current goals can be found in the Care Plan section)  Acute Rehab PT Goals Patient Stated Goal: to return home PT Goal Formulation: With patient/family Time For Goal Achievement: 03/19/21 Potential to Achieve Goals: Good    Frequency Min 2X/week   Barriers to discharge        Co-evaluation               AM-PAC PT "6 Clicks" Mobility  Outcome Measure Help needed turning from your back to your side while in a flat bed without using bedrails?: A Little Help needed moving from lying on your back to sitting on the side of a flat bed without using bedrails?: A Little Help needed moving to and from a bed to a chair (including a wheelchair)?: A Little Help needed standing up from a chair using your arms (e.g., wheelchair or bedside chair)?: A Little Help needed to walk in hospital room?: A Little Help needed climbing 3-5 steps with a railing? : A Little 6 Click Score: 18    End of Session Equipment Utilized During Treatment: Gait belt Activity Tolerance: Patient tolerated treatment well Patient left: in bed;with bed alarm set;with family/visitor present Nurse Communication: Mobility status;Other (comment) (to just not have patient wear brace and if she reports pain again to look into  getting a lumbar corset instead of the TLSO) PT Visit Diagnosis: Unsteadiness on feet (R26.81);History of falling (Z91.81);Difficulty in walking, not elsewhere classified (R26.2)    Time: 8527-7824 PT Time Calculation (min) (ACUTE ONLY): 32 min   Charges:   PT Evaluation $PT Eval Moderate Complexity: 1 Mod PT Treatments $Gait Training: 8-22 mins        [employer], PT, GCS 03/12/21,12:40 PM

## 2021-03-12 NOTE — ED Notes (Signed)
Dr. Sidney Ace Ok'd the patient having her brace & mitts removed while sleeping and replaced in the AM. Once the brace was removed the patient began removing monitoring equipment as well. Pt refused to listen to this RN about keeping on the equipment and the mitts were reapplied. MD aware.

## 2021-03-12 NOTE — Progress Notes (Addendum)
Patient ID: Leah Holmes, female   DOB: 1927/02/13, 87 y.o.   MRN: [id number] [facility] PROGRESS NOTE  Petula Rotolo QMV:[id number] DOB: 08-Apr-1927 DOA: 03/10/2021 PCP: Crecencio Mc, MD  HPI/Subjective: Patient seen in the morning and had a lot of back pain at that time.  She was able to straight leg raise.  Came in with accelerated hypertension initially started on a nicardipine drip.  Also found to have a compression fracture.  Objective: Vitals:   03/12/21 1132 03/12/21 1153  BP: (!) 155/65 (!) 155/65  Pulse: 64 74  Resp:  18  Temp:  98.1 F (36.7 C)  SpO2:  98%   No intake or output data in the 24 hours ending 03/12/21 1427 There were no vitals filed for this visit.  ROS: Review of Systems  Respiratory:  Negative for shortness of breath.   Cardiovascular:  Negative for chest pain.  Gastrointestinal:  Negative for abdominal pain, nausea and vomiting.  Musculoskeletal:  Positive for back pain.  Exam: Physical Exam HENT:     Head: Normocephalic.     Mouth/Throat:     Pharynx: No oropharyngeal exudate.  Eyes:     General: Lids are normal.     Conjunctiva/sclera: Conjunctivae normal.     Pupils: Pupils are equal, round, and reactive to light.  Cardiovascular:     Rate and Rhythm: Normal rate and regular rhythm.     Heart sounds: Normal heart sounds, S1 normal and S2 normal.  Pulmonary:     Breath sounds: No decreased breath sounds, wheezing, rhonchi or rales.  Abdominal:     Palpations: Abdomen is soft.     Tenderness: There is no abdominal tenderness.  Musculoskeletal:     Right lower leg: No swelling.     Left lower leg: No swelling.  Neurological:     Mental Status: She is alert and oriented to person, place, and time.     Comments: Able to straight leg raise      Scheduled Meds:  amLODipine  10 mg Oral Daily   atenolol  50 mg Oral BID   docusate sodium  200 mg Oral BID   hydrochlorothiazide  25 mg Oral Daily   ipratropium  2  spray Each Nare Q12H   isosorbide mononitrate  60 mg Oral BID   lisinopril  40 mg Oral BID   pantoprazole  40 mg Oral Daily   polyethylene glycol  17 g Oral Daily   traMADol  50 mg Oral BID   Warfarin - Pharmacist Dosing Inpatient   Does not apply [id number]    Assessment/Plan:  Hypertensive urgency likely secondary to pain.  Patient on atenolol, lisinopril, Imdur, hydrochlorothiazide and amlodipine.  Check renin aldosterone ratio.  Likely will add low-dose Aldactone tomorrow after blood test drawn. Acute to subacute compression fracture of L2.  We will order a lumbar corset.  The brace that she had was pressing on her neck and uncomfortable.  Patient was able to walk 100 feet and had less pain today.  Tramadol as needed for pain QT prolongation.  Continue atenolol Paroxysmal atrial fibrillation on atenolol and Coumadin. History of breast cancer status postlumpectomy October 2021. Hypokalemia.  Replace potassium.     Code Status:     Code Status Orders  (From admission, onward)           Start     Ordered   03/10/21 1951  Full code  Continuous        03/10/21 1953  Code Status History     Date Active Date Inactive Code Status Order ID Comments User Context   01/28/2016 1521 01/29/2016 1400 Full Code [id number]  Isaias Cowman, MD Inpatient   01/09/2016 1456 01/10/2016 1549 Full Code [id number]  Vaughan Basta, MD ED      Family Communication: Spoke with daughter on the phone Disposition Plan: Status is: Inpatient  Dispo: The patient is from: Home              Anticipated d/c is to: Home              Patient currently doing better with regards to her pain.  Blood pressure trended better but this last blood pressure up again.  Hopefully will be able to send home tomorrow if pain controlled and blood pressure better   Difficult to place patient.  No.  Time spent: 28 minutes  [place]

## 2021-03-12 NOTE — Telephone Encounter (Signed)
Patients daughter is calling in to cancel her appointment due to the patient being in the hospital.The next opening with Dr.Tullo will be 10/17,please advise.

## 2021-03-13 ENCOUNTER — Ambulatory Visit: Payer: Medicare Other | Admitting: Internal Medicine

## 2021-03-13 DIAGNOSIS — R531 Weakness: Secondary | ICD-10-CM

## 2021-03-13 DIAGNOSIS — I160 Hypertensive urgency: Secondary | ICD-10-CM | POA: Diagnosis not present

## 2021-03-13 DIAGNOSIS — I480 Paroxysmal atrial fibrillation: Secondary | ICD-10-CM | POA: Diagnosis not present

## 2021-03-13 DIAGNOSIS — S32020A Wedge compression fracture of second lumbar vertebra, initial encounter for closed fracture: Secondary | ICD-10-CM | POA: Diagnosis not present

## 2021-03-13 DIAGNOSIS — R9431 Abnormal electrocardiogram [ECG] [EKG]: Secondary | ICD-10-CM | POA: Diagnosis not present

## 2021-03-13 LAB — PROTIME-INR
INR: 3.6 — ABNORMAL HIGH (ref 0.8–1.2)
Prothrombin Time: 35.7 seconds — ABNORMAL HIGH (ref 11.4–15.2)

## 2021-03-13 MED ORDER — CALCITONIN (SALMON) 200 UNIT/ACT NA SOLN
1.0000 | Freq: Every day | NASAL | Status: DC
  Administered 2021-03-13 – 2021-03-14 (×2): 1 via NASAL
  Filled 2021-03-13 (×2): qty 3.7

## 2021-03-13 MED ORDER — SPIRONOLACTONE 25 MG PO TABS
12.5000 mg | ORAL_TABLET | Freq: Every day | ORAL | Status: DC
  Administered 2021-03-13: 12.5 mg via ORAL
  Filled 2021-03-13 (×2): qty 0.5

## 2021-03-13 NOTE — Progress Notes (Signed)
[name], female   DOB: [birth year], 87 y.o.   MRN: [id number] [facility] PROGRESS NOTE  Leah Holmes [id number]:[id number] DOB: [birth year] DOA: 03/10/2021 PCP: Crecencio Mc, MD  HPI/Subjective: Had more difficulty getting around today and more back pain this morning.  A new back brace was obtained.  Patient and family interested in rehab at this point.  Admitted with accelerated hypertension and back pain and found to have a compression fracture.  Objective: Vitals:   03/13/21 0758 03/13/21 1548  BP: (!) 153/40 (!) 168/79  Pulse: (!) 59 64  Resp: 16 16  Temp: 98.2 F (36.8 C) 98.2 F (36.8 C)  SpO2: 96% 96%    Intake/Output Summary (Last 24 hours) at 03/13/2021 1706 Last data filed at 03/13/2021 1352 Gross per 24 hour  Intake 120 ml  Output 0 ml  Net 120 ml   Filed Weights   03/12/21 1645  Weight: 56.7 kg    ROS: Review of Systems  Respiratory:  Negative for shortness of breath.   Cardiovascular:  Negative for chest pain.  Gastrointestinal:  Negative for abdominal pain.  Musculoskeletal:  Positive for back pain.  Exam: Physical Exam HENT:     Head: Normocephalic.     Mouth/Throat:     Pharynx: No oropharyngeal exudate.  Eyes:     General: Lids are normal.     Conjunctiva/sclera: Conjunctivae normal.  Cardiovascular:     Rate and Rhythm: Normal rate and regular rhythm.     Heart sounds: Normal heart sounds, S1 normal and S2 normal.  Pulmonary:     Breath sounds: Examination of the right-lower field reveals decreased breath sounds. Examination of the left-lower field reveals decreased breath sounds. Decreased breath sounds present. No wheezing, rhonchi or rales.  Abdominal:     Palpations: Abdomen is soft.     Tenderness: There is no abdominal tenderness.  Musculoskeletal:     Right lower leg: No swelling.     Left lower leg: No swelling.  Skin:    General: Skin is warm.     Findings: No rash.  Neurological:      Mental Status: She is alert.     Comments: Answers some yes/no questions.  Able to straight leg raise bilaterally without a problem.      Scheduled Meds:  amLODipine  10 mg Oral Daily   atenolol  50 mg Oral BID   calcitonin (salmon)  1 spray Alternating Nares Daily   docusate sodium  200 mg Oral BID   hydrochlorothiazide  25 mg Oral Daily   ipratropium  2 spray Each Nare Q12H   isosorbide mononitrate  60 mg Oral BID   lisinopril  40 mg Oral BID   pantoprazole  40 mg Oral Daily   polyethylene glycol  17 g Oral Daily   spironolactone  12.5 mg Oral Daily   traMADol  50 mg Oral BID   Warfarin - Pharmacist Dosing Inpatient   Does not apply [id number]     Assessment/Plan:  Hypertensive urgency on presentation likely secondary to pain.  Continue atenolol, lisinopril, Imdur, hydrochlorothiazide and amlodipine.  Handed Aldactone today.  Renin and aldosterone pending. Acute to subacute compression fracture of L2.  Lumbar corset ordered and obtained today.  Patient did worse with physical therapy and they changed the recommendation out to rehab.  As needed tramadol for pain.  Added Miacalcin nasal spray for pain. QT prolongation.  Continue atenolol. Paroxysmal atrial fibrillation on atenolol for rate control and  Coumadin for anticoagulation.  Pharmacy dosing Coumadin.  INR 3.6 today. History of breast cancer.  Status post lumpectomy in 2021 Hypokalemia.  Added spironolactone.  Recheck electrolytes tomorrow morning. Weakness.  Physical therapy change the recommendation to rehab.  Transitional care team to look into bed offers.    Code Status:     Code Status Orders  (From admission, onward)           Start     Ordered   03/10/21 1951  Full code  Continuous        03/10/21 1953           Code Status History     Date Active Date Inactive Code Status Order ID Comments User Context   01/28/2016 1521 01/29/2016 1400 Full Code [id number]  Leah Cowman, MD Inpatient   01/09/2016  1456 01/10/2016 1549 Full Code [id number]  Leah Basta, MD ED      Family Communication: Spoke with daughter at the bedside Disposition Plan: Status is: Inpatient  Dispo: The patient is from: Home              Anticipated d/c is to: Rehab              Patient currently looking for rehab beds.  Medically stable to go out to rehab once bed found.   Difficult to place patient.  No.  Time spent: 27 minutes  [place]

## 2021-03-13 NOTE — NC FL2 (Signed)
[place] LEVEL OF CARE SCREENING TOOL     IDENTIFICATION  Patient Name: Leah Holmes Birthdate: Jul 23, 1926 Sex: female Admission Date (Current Location): 03/10/2021  [facility] and [place] Number:  Engineering geologist and Address:  [facility], [address], [place], [place] [address]      Provider Number: [id number]  Attending Physician Name and Address:  Loletha Grayer, MD  Relative Name and Phone Number:  [name] [phone]    Current Level of Care: Hospital Recommended Level of Care: [facility] Prior Approval Number:    Date Approved/Denied:   PASRR Number: [id number]  Discharge Plan: SNF    Current Diagnoses: Patient Active Problem List   Diagnosis Date Noted   Closed compression fracture of second lumbar vertebra (HCC)    QT prolongation    AF (paroxysmal atrial fibrillation) ([place])    Hypokalemia    Back pain 03/10/2021   Hypertensive urgency 03/10/2021   Myalgia due to statin 09/15/2020   Acquired thrombophilia ([place]) 12/25/2019   Weight loss, unintentional 12/25/2019   Closed rib fracture 09/08/2019   S/P placement of cardiac pacemaker 09/08/2019   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 09/08/2019   Pruritus 09/08/2019   Invasive ductal carcinoma of breast, female, left ([place]) 04/26/2018   Bilateral carotid artery stenosis 04/20/2018   Aortic atherosclerosis ([place]) 03/15/2018   Balance problem 03/15/2018   Personal history of fall, presenting hazards to health 03/15/2018   Mass of upper inner quadrant of left breast 03/14/2018   Fatigue 10/27/2017   Intention tremor 04/15/2017   Osteoporosis 03/21/2016   Sick sinus syndrome ([employer]) 01/28/2016   Other malaise 11/01/2015   Major depressive disorder, recurrent episode ([place]) 10/30/2015   Constipation 07/27/2015   Generalized anxiety disorder 04/02/2015   Ischemic chest pain ([place]) 01/08/2015   Chronic atrial fibrillation ([place]) 08/16/2014    Medicare annual wellness visit, subsequent 05/23/2014   Hyperlipidemia, mixed 05/08/2014   Moderate mitral insufficiency 05/08/2014   Moderate tricuspid insufficiency 05/08/2014   Dry mouth 03/10/2014   Low serum vitamin D 02/16/2014   LVH (left ventricular hypertrophy) 11/23/2013   Anal polyp 09/20/2013   Personal history of colonic polyps 08/31/2013   CAD (coronary artery disease) 03/09/2013   DM type 2 with diabetic peripheral neuropathy ([place]) 03/09/2013   Hyperlipidemia associated with type 2 diabetes mellitus ([place]) 03/09/2013   Essential hypertension, benign 03/09/2013   Nonulcer dyspepsia 03/09/2013    Orientation RESPIRATION BLADDER Height & Weight     Self, Place  Normal Continent Weight: 56.7 kg Height:  5\' 6"  (167.6 cm)  BEHAVIORAL SYMPTOMS/MOOD NEUROLOGICAL BOWEL NUTRITION STATUS      Continent Diet (low sodium)  AMBULATORY STATUS COMMUNICATION OF NEEDS Skin   Extensive Assist Verbally Normal                       Personal Care Assistance Level of Assistance  Bathing, Dressing Bathing Assistance: Limited assistance   Dressing Assistance: Limited assistance     Functional Limitations Info  Sight, Hearing Sight Info: Impaired Hearing Info: Impaired      SPECIAL CARE FACTORS FREQUENCY  PT (By licensed PT), OT (By licensed OT)     PT Frequency: 5 times per week OT Frequency: 5 times per week            Contractures Contractures Info: Not present    Additional Factors Info  Code Status, Allergies Code Status Info: Full code Allergies Info: Codeine, Penicillins, Amlodipine, Atorvastatin,  Clonidine Derivatives, Gabapentin, Losartan, Morphine, Morphine And Related, Reglan (Metoclopramide), Hydralazine           Current Medications (03/13/2021):  This is the current hospital active medication list Current Facility-Administered Medications  Medication Dose Route Frequency Provider Last Rate Last Admin   acetaminophen (TYLENOL) tablet 1,000 mg   1,000 mg Oral Q6H PRN Cox, Amy N, DO   1,000 mg at 03/11/21 2255   Or   acetaminophen (TYLENOL) suppository 650 mg  650 mg Rectal Q6H PRN Cox, Amy N, DO       albuterol (PROVENTIL) (2.5 MG/3ML) 0.083% nebulizer solution 3 mL  3 mL Inhalation Q4H PRN Cox, Amy N, DO       ALPRAZolam Duanne Moron) tablet 0.25 mg  0.25 mg Oral TID PRN Mansy, Jan A, MD   0.25 mg at 03/12/21 2133   amLODipine (NORVASC) tablet 10 mg  10 mg Oral Daily Cox, Amy N, DO   10 mg at 03/13/21 1020   atenolol (TENORMIN) tablet 50 mg  50 mg Oral BID Cox, Amy N, DO   50 mg at 03/13/21 1022   calcitonin (salmon) (MIACALCIN/FORTICAL) nasal spray 1 spray  1 spray Alternating Nares Daily Loletha Grayer, MD   1 spray at 03/13/21 1258   docusate sodium (COLACE) capsule 200 mg  200 mg Oral BID Wyvonnia Dusky, MD   200 mg at 03/13/21 1019   hydrochlorothiazide (HYDRODIURIL) tablet 25 mg  25 mg Oral Daily Cox, Amy N, DO   25 mg at 03/13/21 1020   ipratropium (ATROVENT) 0.03 % nasal spray 2 spray  2 spray Each Nare Q12H Cox, Amy N, DO   2 spray at 03/13/21 1243   isosorbide mononitrate (IMDUR) 24 hr tablet 60 mg  60 mg Oral BID Cox, Amy N, DO   60 mg at 03/13/21 1020   lisinopril (ZESTRIL) tablet 40 mg  40 mg Oral BID Cox, Amy N, DO   40 mg at 03/13/21 1022   meclizine (ANTIVERT) tablet 25 mg  25 mg Oral TID PRN Cox, Amy N, DO       nitroGLYCERIN (NITROSTAT) SL tablet 0.4 mg  0.4 mg Sublingual Q5 min PRN Cox, Amy N, DO       ondansetron (ZOFRAN) tablet 4 mg  4 mg Oral Q6H PRN Cox, Amy N, DO       Or   ondansetron (ZOFRAN) injection 4 mg  4 mg Intravenous Q6H PRN Cox, Amy N, DO       pantoprazole (PROTONIX) EC tablet 40 mg  40 mg Oral Daily Cox, Amy N, DO   40 mg at 03/13/21 1019   phenol (CHLORASEPTIC) mouth spray 1 spray  1 spray Mouth/Throat PRN Wyvonnia Dusky, MD       polyethylene glycol (MIRALAX / GLYCOLAX) packet 17 g  17 g Oral Daily PRN Cox, Amy N, DO       polyethylene glycol (MIRALAX / GLYCOLAX) packet 17 g  17 g Oral Daily  Eppie Gibson M, MD   17 g at 03/13/21 1017   psyllium (HYDROCIL/METAMUCIL) 1 packet  1 packet Oral Daily PRN Cox, Amy N, DO       spironolactone (ALDACTONE) tablet 12.5 mg  12.5 mg Oral Daily Wieting, Richard, MD   12.5 mg at 03/13/21 1018   traMADol (ULTRAM) tablet 50 mg  50 mg Oral BID Cox, Amy N, DO   50 mg at 03/13/21 1021   traZODone (DESYREL) tablet 25 mg  25 mg Oral QHS  PRN Loletha Grayer, MD   25 mg at 03/12/21 2133   Warfarin - Pharmacist Dosing Inpatient   Does not apply q1600 Cox, Amy N, DO         Discharge Medications: Please see discharge summary for a list of discharge medications.  Relevant Imaging Results:  Relevant Lab Results:   Additional Information SS# [id number]  Su Hilt, RN

## 2021-03-13 NOTE — Care Management Important Message (Signed)
Important Message  Patient Details  Name: Leah Holmes MRN: [id number] Date of Birth: 09-05-1926   Medicare Important Message Given:  Yes     Dannette Barbara 03/13/2021, 2:48 PM

## 2021-03-13 NOTE — Progress Notes (Signed)
Physical Therapy Treatment Patient Details Name: Leah Holmes MRN: [id number] DOB: June 27, 1926 Today's Date: 03/13/2021   History of Present Illness Leah Holmes is a 87 y.o. female with medical history significant for hypertension, atrial fibrillation, chronic pain, GERD, constipation, who presents to the emergency department for chief concerns of back pain. Patient with L2 compression fracture.    PT Comments    Patient received in bed, daughter at bedside. Ortho tech here with lumbar corset. Daughter reporting patient is very weak and having more difficulty and pain this day. Patient requires mod assist for rolling in bed. Mod assist for side lying >< sit. Min assist for sit to stand and min guard for ambulating in room with RW. Patient requires more assistance with basic mobility this session compared to yesterday due to increased pain in low back. She will continue to benefit from skilled PT while here to improve functional mobility and independence.        Recommendations for follow up therapy are one component of a multi-disciplinary discharge planning process, led by the attending physician.  Recommendations may be updated based on patient status, additional functional criteria and insurance authorization.  Follow Up Recommendations  SNF;Supervision/Assistance - 24 hour     Equipment Recommendations  Other (comment) (TBD)    Recommendations for Other Services       Precautions / Restrictions Precautions Precautions: Fall;Back Required Braces or Orthoses: Spinal Brace Spinal Brace: Lumbar corset;Applied in supine position Restrictions Weight Bearing Restrictions: No Other Position/Activity Restrictions: patient to wear lumbar corset when oob.     Mobility  Bed Mobility Overal bed mobility: Needs Assistance Bed Mobility: Rolling;Sidelying to Sit;Sit to Sidelying Rolling: Mod assist Sidelying to sit: Mod assist     Sit to sidelying: Mod  assist General bed mobility comments: patient requires assistance and cues for log rolling in bed.    Transfers Overall transfer level: Needs assistance Equipment used: Rolling walker (2 wheeled) Transfers: Sit to/from Stand Sit to Stand: Min assist         General transfer comment: min assist to power up  Ambulation/Gait Ambulation/Gait assistance: Min guard Gait Distance (Feet): 25 Feet Assistive device: Rolling walker (2 wheeled) Gait Pattern/deviations: Step-through pattern;Decreased step length - right;Decreased step length - left;Decreased stride length Gait velocity: decr   General Gait Details: slightly shaky, but she is shaky at baseline. Patient's daughter reports it took herself and NT to get patient to bathroom right before I arrived.   Stairs             Wheelchair Mobility    Modified Rankin (Stroke Patients Only)       Balance Overall balance assessment: Needs assistance;History of Falls Sitting-balance support: Feet supported Sitting balance-[name]: Fair Sitting balance - Comments: leaning to left in sitting this session. Cues needed to maintain independent sitting Postural control: Left lateral lean Standing balance support: Bilateral upper extremity supported;During functional activity Standing balance-[name]: Fair Standing balance comment: relies on B UE support and min guard                            Cognition Arousal/Alertness: Awake/alert Behavior During Therapy: WFL for tasks assessed/performed Overall Cognitive Status: Within Functional Limits for tasks assessed                                 General Comments: patient not always able to  discribe pain well      Exercises      General Comments        Pertinent Vitals/Pain Pain Assessment: Faces Faces Pain Scale: Hurts little more Pain Location: low back Pain Descriptors / Indicators: Discomfort;Grimacing;Guarding;Sore Pain  Intervention(s): Monitored during session;Limited activity within patient's tolerance;Repositioned    Home Living                      Prior Function            PT Goals (current goals can now be found in the care plan section) Acute Rehab PT Goals Patient Stated Goal: to return home, lives with daughter, but due to increased pain this visit, patient not moving as well. PT Goal Formulation: With patient/family Time For Goal Achievement: 03/19/21 Potential to Achieve Goals: Fair Progress towards PT goals: Progressing toward goals    Frequency    Min 5X/week      PT Plan Discharge plan needs to be updated    Co-evaluation              AM-PAC PT "6 Clicks" Mobility   Outcome Measure  Help needed turning from your back to your side while in a flat bed without using bedrails?: A Lot Help needed moving from lying on your back to sitting on the side of a flat bed without using bedrails?: A Lot Help needed moving to and from a bed to a chair (including a wheelchair)?: A Little Help needed standing up from a chair using your arms (e.g., wheelchair or bedside chair)?: A Little Help needed to walk in hospital room?: A Little Help needed climbing 3-5 steps with a railing? : A Lot 6 Click Score: 15    End of Session Equipment Utilized During Treatment: Gait belt;Back brace Activity Tolerance: Patient limited by fatigue;Patient limited by pain Patient left: in bed;with call bell/phone within reach;with bed alarm set Nurse Communication: Mobility status PT Visit Diagnosis: History of falling (Z91.81);Difficulty in walking, not elsewhere classified (R26.2);Pain;Other abnormalities of gait and mobility (R26.89);Muscle weakness (generalized) (M62.81) Pain - part of body:  (back)     Time: 1145-1210 PT Time Calculation (min) (ACUTE ONLY): 25 min  Charges:  $Therapeutic Activity: 8-22 mins                     [employer], PT, GCS 03/13/21,12:20 PM

## 2021-03-13 NOTE — Consult Note (Signed)
ANTICOAGULATION CONSULT NOTE - Follow Up Consult  Pharmacy Consult for  Warfarin Indication: atrial fibrillation  Allergies  Allergen Reactions   Codeine Anaphylaxis   Penicillins Anaphylaxis    Has patient had a PCN reaction causing immediate rash, facial/tongue/throat swelling, SOB or lightheadedness with hypotension: Yes Has patient had a PCN reaction causing severe rash involving mucus membranes or skin necrosis: No Has patient had a PCN reaction that required hospitalization Yes Has patient had a PCN reaction occurring within the last 10 years: No If all of the above answers are "NO", then may proceed with Cephalosporin use.   Amlodipine Other (See Comments)    Diffuse itching    Atorvastatin     Other reaction(s): Unknown   Clonidine Derivatives Other (See Comments)    Reaction:  Unknown    Gabapentin Nausea Only   Losartan Other (See Comments)    Other reaction(s): Other (See Comments) Reaction:  Muscle aches  Reaction:  Muscle aches    Morphine     Other reaction(s): Unknown   Morphine And Related Other (See Comments)    Reaction:  Unknown    Reglan [Metoclopramide] Other (See Comments)    Reaction:  Tremors    Hydralazine Anxiety    Patient Measurements:  Vital Signs: Temp: 98.2 F (36.8 C) (09/29 0758) Temp Source: Oral (09/29 0758) BP: 153/40 (09/29 0758) Pulse Rate: 59 (09/29 0758)  Labs: Recent Labs    03/10/21 1455 03/10/21 2150 03/10/21 2150 03/11/21 0750 03/12/21 0642 03/13/21 0650  HGB 16.3*  --   --  15.5* 15.4*  --   HCT 47.0*  --   --  45.0 46.1*  --   PLT 193  --   --  208 220  --   APTT  --  76*  --   --   --   --   LABPROT  --  33.2*   < > 33.1* 38.7* 35.7*  INR  --  3.3*   < > 3.2* 4.0* 3.6*  CREATININE 0.58  --   --  0.66 0.53  --    < > = values in this interval not displayed.     Estimated Creatinine Clearance: 40.2 mL/min (by C-G formula based on SCr of 0.53 mg/dL).   Medications:  PTA warfarin 4 mg daily at 6 pm (except  wednesday or Sunday) and 2 mg twice per week on Wednesday and Sunday  Assessment: 87yo Female w/ h/o CHF, Afib, breast cancer, CAD, HLD, HTN, & ulcers presenting from clinic with CC of back pain. CT scan c/w L2 Compression fracture. Pharmacy consulted for the mgmt of warfarin dosing while inpatient.   INR remains supratherapeutic with INR goal range of 2-3. No current DDI's at this time.  Date Time INR Dose/Comment 9/24 2000 Unk PTA dose 4mg  9/25 2000 Unk PTA dose 2mg  9/26  2150 3.3  2mg  x1 9/27    0827    3.2       2mg  x1 9/28    0727    4.0       HOLD 9/29 0650 3.6 HOLD   Goal of Therapy:  INR 2-3 Monitor platelets by anticoagulation protocol: Yes   Plan:  HOLD warfarin today Plan to resume home regimen once INR 3 or lower Daily INR x 5 already ordered by provider CBC stable Will continue daily follow up and warfarin adjustment as needed  Dorothe Pea, PharmD, BCPS Clinical Pharmacist   03/13/2021 8:23 AM

## 2021-03-13 NOTE — Telephone Encounter (Signed)
Spoke with pt's daughter and she stated that pt may be discharged from hospital tomorrow but will be going to rehab. Daughter was advised to call when being discharged from rehab to schedule a follow up appt. Daughter gave a verbal understanding.

## 2021-03-13 NOTE — Progress Notes (Signed)
Orthopedic Tech Progress Note Patient Details:  Leah Holmes 11/30/1926 [id number] Called in order to [facility] for LSO brace Patient ID: Leah Holmes, female   DOB: 1926/12/12, 87 y.o.   MRN: [id number]  Leah Holmes 03/13/2021, 9:16 AM

## 2021-03-13 NOTE — TOC Progression Note (Signed)
Transition of Care [facility]) - Progression Note    Patient Details  Name: Leah Holmes MRN: [id number] Date of Birth: 1926-12-31  Transition of Care [facility]) CM/SW [place], RN Phone Number: 03/13/2021, 1:03 PM  Clinical Narrative:    Spoke with the daughter in law at the bedside, they would like a bedsearch to be done, The patient has not been vaccinated, she did have covid last year, PASSR obtained, FL2 completed, Bedsearch sent        Expected Discharge Plan and Services                                                 Social Determinants of Health (SDOH) Interventions    Readmission Risk Interventions No flowsheet data found.

## 2021-03-14 DIAGNOSIS — C50912 Malignant neoplasm of unspecified site of left female breast: Secondary | ICD-10-CM | POA: Diagnosis not present

## 2021-03-14 DIAGNOSIS — S32020D Wedge compression fracture of second lumbar vertebra, subsequent encounter for fracture with routine healing: Secondary | ICD-10-CM | POA: Diagnosis not present

## 2021-03-14 DIAGNOSIS — J3089 Other allergic rhinitis: Secondary | ICD-10-CM | POA: Diagnosis not present

## 2021-03-14 DIAGNOSIS — B370 Candidal stomatitis: Secondary | ICD-10-CM | POA: Diagnosis not present

## 2021-03-14 DIAGNOSIS — I160 Hypertensive urgency: Secondary | ICD-10-CM | POA: Diagnosis not present

## 2021-03-14 DIAGNOSIS — K219 Gastro-esophageal reflux disease without esophagitis: Secondary | ICD-10-CM | POA: Diagnosis not present

## 2021-03-14 DIAGNOSIS — R634 Abnormal weight loss: Secondary | ICD-10-CM | POA: Diagnosis not present

## 2021-03-14 DIAGNOSIS — E876 Hypokalemia: Secondary | ICD-10-CM | POA: Diagnosis not present

## 2021-03-14 DIAGNOSIS — I2510 Atherosclerotic heart disease of native coronary artery without angina pectoris: Secondary | ICD-10-CM | POA: Diagnosis not present

## 2021-03-14 DIAGNOSIS — M62830 Muscle spasm of back: Secondary | ICD-10-CM | POA: Diagnosis not present

## 2021-03-14 DIAGNOSIS — U071 COVID-19: Secondary | ICD-10-CM | POA: Diagnosis not present

## 2021-03-14 DIAGNOSIS — Z7401 Bed confinement status: Secondary | ICD-10-CM | POA: Diagnosis not present

## 2021-03-14 DIAGNOSIS — R059 Cough, unspecified: Secondary | ICD-10-CM | POA: Diagnosis not present

## 2021-03-14 DIAGNOSIS — M810 Age-related osteoporosis without current pathological fracture: Secondary | ICD-10-CM | POA: Diagnosis not present

## 2021-03-14 DIAGNOSIS — I509 Heart failure, unspecified: Secondary | ICD-10-CM | POA: Diagnosis not present

## 2021-03-14 DIAGNOSIS — I495 Sick sinus syndrome: Secondary | ICD-10-CM | POA: Diagnosis not present

## 2021-03-14 DIAGNOSIS — I10 Essential (primary) hypertension: Secondary | ICD-10-CM | POA: Diagnosis not present

## 2021-03-14 DIAGNOSIS — W19XXXD Unspecified fall, subsequent encounter: Secondary | ICD-10-CM | POA: Diagnosis not present

## 2021-03-14 DIAGNOSIS — M6259 Muscle wasting and atrophy, not elsewhere classified, multiple sites: Secondary | ICD-10-CM | POA: Diagnosis not present

## 2021-03-14 DIAGNOSIS — R5381 Other malaise: Secondary | ICD-10-CM | POA: Diagnosis not present

## 2021-03-14 DIAGNOSIS — F411 Generalized anxiety disorder: Secondary | ICD-10-CM | POA: Diagnosis not present

## 2021-03-14 DIAGNOSIS — R531 Weakness: Secondary | ICD-10-CM | POA: Diagnosis not present

## 2021-03-14 DIAGNOSIS — F339 Major depressive disorder, recurrent, unspecified: Secondary | ICD-10-CM | POA: Diagnosis not present

## 2021-03-14 DIAGNOSIS — R509 Fever, unspecified: Secondary | ICD-10-CM | POA: Diagnosis not present

## 2021-03-14 DIAGNOSIS — R768 Other specified abnormal immunological findings in serum: Secondary | ICD-10-CM | POA: Diagnosis not present

## 2021-03-14 DIAGNOSIS — I480 Paroxysmal atrial fibrillation: Secondary | ICD-10-CM | POA: Diagnosis not present

## 2021-03-14 DIAGNOSIS — E782 Mixed hyperlipidemia: Secondary | ICD-10-CM | POA: Diagnosis not present

## 2021-03-14 DIAGNOSIS — K5900 Constipation, unspecified: Secondary | ICD-10-CM | POA: Diagnosis not present

## 2021-03-14 DIAGNOSIS — K589 Irritable bowel syndrome without diarrhea: Secondary | ICD-10-CM | POA: Diagnosis not present

## 2021-03-14 DIAGNOSIS — R2681 Unsteadiness on feet: Secondary | ICD-10-CM | POA: Diagnosis not present

## 2021-03-14 DIAGNOSIS — H04129 Dry eye syndrome of unspecified lacrimal gland: Secondary | ICD-10-CM | POA: Diagnosis not present

## 2021-03-14 DIAGNOSIS — S32020A Wedge compression fracture of second lumbar vertebra, initial encounter for closed fracture: Secondary | ICD-10-CM | POA: Diagnosis not present

## 2021-03-14 DIAGNOSIS — E43 Unspecified severe protein-calorie malnutrition: Secondary | ICD-10-CM | POA: Diagnosis not present

## 2021-03-14 DIAGNOSIS — R251 Tremor, unspecified: Secondary | ICD-10-CM | POA: Diagnosis not present

## 2021-03-14 DIAGNOSIS — E559 Vitamin D deficiency, unspecified: Secondary | ICD-10-CM | POA: Diagnosis not present

## 2021-03-14 DIAGNOSIS — R1319 Other dysphagia: Secondary | ICD-10-CM | POA: Diagnosis not present

## 2021-03-14 DIAGNOSIS — L299 Pruritus, unspecified: Secondary | ICD-10-CM | POA: Diagnosis not present

## 2021-03-14 DIAGNOSIS — M545 Low back pain, unspecified: Secondary | ICD-10-CM | POA: Diagnosis not present

## 2021-03-14 DIAGNOSIS — S32020S Wedge compression fracture of second lumbar vertebra, sequela: Secondary | ICD-10-CM | POA: Diagnosis not present

## 2021-03-14 DIAGNOSIS — E1142 Type 2 diabetes mellitus with diabetic polyneuropathy: Secondary | ICD-10-CM | POA: Diagnosis not present

## 2021-03-14 DIAGNOSIS — Z4789 Encounter for other orthopedic aftercare: Secondary | ICD-10-CM | POA: Diagnosis not present

## 2021-03-14 LAB — BASIC METABOLIC PANEL
Anion gap: 6 (ref 5–15)
BUN: 12 mg/dL (ref 8–23)
CO2: 31 mmol/L (ref 22–32)
Calcium: 8.8 mg/dL — ABNORMAL LOW (ref 8.9–10.3)
Chloride: 96 mmol/L — ABNORMAL LOW (ref 98–111)
Creatinine, Ser: 0.48 mg/dL (ref 0.44–1.00)
GFR, Estimated: >60 mL/min (ref >60)
Glucose, Bld: 159 mg/dL — ABNORMAL HIGH (ref 70–99)
Potassium: 3.8 mmol/L (ref 3.5–5.1)
Sodium: 133 mmol/L — ABNORMAL LOW (ref 135–145)

## 2021-03-14 LAB — MAGNESIUM: Magnesium: 1.8 mg/dL (ref 1.7–2.4)

## 2021-03-14 LAB — RESP PANEL BY RT-PCR (FLU A&B, COVID) ARPGX2
Influenza A by PCR: NEGATIVE
Influenza B by PCR: NEGATIVE
SARS Coronavirus 2 by RT PCR: NEGATIVE

## 2021-03-14 LAB — PROTIME-INR
INR: 2.7 — ABNORMAL HIGH (ref 0.8–1.2)
Prothrombin Time: 28.5 seconds — ABNORMAL HIGH (ref 11.4–15.2)

## 2021-03-14 MED ORDER — SPIRONOLACTONE 25 MG PO TABS: 25.0000 mg | ORAL_TABLET | Freq: Every day | ORAL | 0 refills | Status: DC

## 2021-03-14 MED ORDER — CALCITONIN (SALMON) 200 UNIT/ACT NA SOLN: 1.0000 | Freq: Every day | NASAL | 0 refills | Status: DC

## 2021-03-14 MED ORDER — SPIRONOLACTONE 25 MG PO TABS
25.0000 mg | ORAL_TABLET | Freq: Every day | ORAL | Status: DC
  Administered 2021-03-14: 25 mg via ORAL
  Filled 2021-03-14: qty 1

## 2021-03-14 MED ORDER — HYDROCHLOROTHIAZIDE 25 MG PO TABS
12.5000 mg | ORAL_TABLET | Freq: Every day | ORAL | Status: DC
  Administered 2021-03-14: 12.5 mg via ORAL
  Filled 2021-03-14: qty 1

## 2021-03-14 MED ORDER — ACETAMINOPHEN 325 MG PO TABS: 650.0000 mg | ORAL_TABLET | Freq: Four times a day (QID) | ORAL | Status: DC | PRN

## 2021-03-14 MED ORDER — LIDOCAINE 5 % EX PTCH
1.0000 | MEDICATED_PATCH | CUTANEOUS | Status: DC
  Filled 2021-03-14: qty 1

## 2021-03-14 MED ORDER — WARFARIN SODIUM 4 MG PO TABS
4.0000 mg | ORAL_TABLET | Freq: Once | ORAL | Status: AC
  Administered 2021-03-14: 4 mg via ORAL
  Filled 2021-03-14: qty 1

## 2021-03-14 MED ORDER — DOCUSATE SODIUM 100 MG PO CAPS: 200.0000 mg | ORAL_CAPSULE | Freq: Two times a day (BID) | ORAL | 0 refills | Status: DC

## 2021-03-14 MED ORDER — TRAMADOL HCL 50 MG PO TABS: 50.0000 mg | ORAL_TABLET | Freq: Two times a day (BID) | ORAL | 0 refills | Status: DC | PRN

## 2021-03-14 MED ORDER — AMLODIPINE BESYLATE 10 MG PO TABS: 10.0000 mg | ORAL_TABLET | Freq: Every day | ORAL | 0 refills | Status: DC

## 2021-03-14 MED ORDER — NYSTATIN 100000 UNIT/ML MT SUSP: 5.0000 mL | Freq: Four times a day (QID) | OROMUCOSAL | 0 refills | Status: AC

## 2021-03-14 MED ORDER — NYSTATIN 100000 UNIT/ML MT SUSP
5.0000 mL | Freq: Four times a day (QID) | OROMUCOSAL | Status: DC
  Administered 2021-03-14 (×2): 500000 [IU] via ORAL
  Filled 2021-03-14 (×2): qty 5

## 2021-03-14 MED ORDER — ACETAMINOPHEN 325 MG PO TABS
650.0000 mg | ORAL_TABLET | Freq: Four times a day (QID) | ORAL | Status: DC | PRN
  Administered 2021-03-14: 650 mg via ORAL
  Filled 2021-03-14: qty 2

## 2021-03-14 MED ORDER — HYDROCHLOROTHIAZIDE 12.5 MG PO TABS: 12.5000 mg | ORAL_TABLET | Freq: Every day | ORAL | 0 refills | Status: DC

## 2021-03-14 NOTE — Progress Notes (Addendum)
Report called to Evelina Bucy, manager at [facility]. Waiting on EMS to transport the patient.   [id number] EMS is here to transport pt.

## 2021-03-14 NOTE — TOC Progression Note (Signed)
Transition of Care [facility]) - Progression Note    Patient Details  Name: Leah Holmes MRN: [id number] Date of Birth: [birth year]  Transition of Care [facility]) CM/SW [place], RN Phone Number: 03/14/2021, 3:07 PM  Clinical Narrative:   Patient to discharge to [facility]. Facility to room E-4. RN to call report to [phone].       Barriers to Discharge: Barriers Resolved  Expected Discharge Plan and Services           Expected Discharge Date: 03/14/21                                     Social Determinants of Health (SDOH) Interventions    Readmission Risk Interventions No flowsheet data found.

## 2021-03-14 NOTE — Discharge Summary (Addendum)
[facility] [place] NAME: Leah Holmes    MR#:  [id number]  DATE OF BIRTH:  1982/06/09  DATE OF ADMISSION:  03/10/2021 ADMITTING PHYSICIAN: Loletha Grayer, MD  DATE OF DISCHARGE: 03/14/2021  PRIMARY CARE PHYSICIAN: Crecencio Mc, MD    ADMISSION DIAGNOSIS:  Hypertensive urgency [I16.0] Closed compression fracture of L2 lumbar vertebra, initial encounter ([place]) [S32.020A] Hypertension, unspecified type [I10]  DISCHARGE DIAGNOSIS:  Principal Problem:   Hypertensive urgency Active Problems:   CAD (coronary artery disease)   DM type 2 with diabetic peripheral neuropathy (HCC)   Hyperlipidemia associated with type 2 diabetes mellitus ([place])   Essential hypertension, benign   Chronic atrial fibrillation (HCC)   Generalized anxiety disorder   Constipation   Major depressive disorder, recurrent episode ([place])   Sick sinus syndrome (HCC)   Weakness   Invasive ductal carcinoma of breast, female, left (HCC)   Hyperlipidemia, mixed   LVH (left ventricular hypertrophy)   S/P placement of cardiac pacemaker   Back pain   Closed compression fracture of L2 lumbar vertebra, initial encounter ([place])   SECONDARY DIAGNOSIS:   Past Medical History:  Diagnosis Date   Atrial fibrillation ([place]) 2012   Breast cancer ([place]) 04/2018   IDC of left breast    CHF (congestive heart failure) ([place])    Colon polyp 2003   Coronary artery disease    COVID-19 virus infection 12/25/2019   Diabetes mellitus without complication (HCC)    Dyspnea    Dysrhythmia    atrial fib   Environmental and seasonal allergies    GERD (gastroesophageal reflux disease)    Hyperlipidemia    Hypertension    Irritable bowel syndrome    Multiple rib fractures 03/15/2018   Personal history of radiation therapy    Polyp of colon    PONV (postoperative nausea and vomiting)    with ether, not recently "woke up in surgery once"   Presence of permanent cardiac pacemaker     Tremor    Tremors of nervous system    Ulcer     HOSPITAL COURSE:   Hypertensive urgency on presentation likely secondary to pain.  Patient was initially started on nicardipine drip.  Now back on her usual atenolol, lisinopril, and Imdur.  Norvasc increased to 10 mg.  Hydrochlorothiazide decreased to 12.5 mg.  Aldactone started and increased up to 12.5 mg.  Renin and aldosterone levels pending. Acute to subacute compression fracture L2.  Lumbar corset ordered.  Physical therapy recommending rehab.  Pain control with as needed tramadol.  Try to convert to Tylenol as quickly as possible.  Prescribed Lidoderm patch also.  Miacalcin nasal spray for pain.  Patient and family would like to do medical management at this point instead of a surgical management. Slight slurred speech which cleared up,  thrush.  Slurred speech could be secondary to tramadol.  Try to convert over to Tylenol as quickly as possible.  Patient is therapeutic on Coumadin.  Treat thrush with nystatin swish and swallow. Paroxysmal atrial fibrillation on atenolol for rate control and Coumadin for anticoagulation.  INR on 9/28 was 4.0.  Today 2.7.  Continue her usual dose of Coumadin.  Check INR weekly. History of breast cancer.  Status postlumpectomy 2021 Hypokalemia.  I added spironolactone.  Electrolytes acceptable. Weakness.  Physical therapy recommends rehab.  Patient will be discharged to rehab today.  DISCHARGE CONDITIONS:   Satisfactory  CONSULTS OBTAINED:    Physical therapy  DRUG ALLERGIES:  Allergies  Allergen Reactions   Codeine Anaphylaxis   Penicillins Anaphylaxis    Has patient had a PCN reaction causing immediate rash, facial/tongue/throat swelling, SOB or lightheadedness with hypotension: Yes Has patient had a PCN reaction causing severe rash involving mucus membranes or skin necrosis: No Has patient had a PCN reaction that required hospitalization Yes Has patient had a PCN reaction occurring within the  last 10 years: No If all of the above answers are "NO", then may proceed with Cephalosporin use.   Amlodipine Other (See Comments)    Diffuse itching    Atorvastatin     Other reaction(s): Unknown   Clonidine Derivatives Other (See Comments)    Reaction:  Unknown    Gabapentin Nausea Only   Losartan Other (See Comments)    Other reaction(s): Other (See Comments) Reaction:  Muscle aches  Reaction:  Muscle aches    Morphine     Other reaction(s): Unknown   Morphine And Related Other (See Comments)    Reaction:  Unknown    Reglan [Metoclopramide] Other (See Comments)    Reaction:  Tremors    Hydralazine Anxiety    DISCHARGE MEDICATIONS:   Allergies as of 03/14/2021       Reactions   Codeine Anaphylaxis   Penicillins Anaphylaxis   Has patient had a PCN reaction causing immediate rash, facial/tongue/throat swelling, SOB or lightheadedness with hypotension: Yes Has patient had a PCN reaction causing severe rash involving mucus membranes or skin necrosis: No Has patient had a PCN reaction that required hospitalization Yes Has patient had a PCN reaction occurring within the last 10 years: No If all of the above answers are "NO", then may proceed with Cephalosporin use.   Amlodipine Other (See Comments)   Diffuse itching    Atorvastatin    Other reaction(s): Unknown   Clonidine Derivatives Other (See Comments)   Reaction:  Unknown    Gabapentin Nausea Only   Losartan Other (See Comments)   Other reaction(s): Other (See Comments) Reaction:  Muscle aches  Reaction:  Muscle aches    Morphine    Other reaction(s): Unknown   Morphine And Related Other (See Comments)   Reaction:  Unknown    Reglan [metoclopramide] Other (See Comments)   Reaction:  Tremors    Hydralazine Anxiety        Medication List     TAKE these medications    acetaminophen 325 MG tablet Commonly known as: Tylenol Take 2 tablets (650 mg total) by mouth every 6 (six) hours as needed.   albuterol 108  (90 Base) MCG/ACT inhaler Commonly known as: ProAir HFA Inhale 1-2 puffs into the lungs every 4 (four) hours as needed for wheezing or shortness of breath.   amLODipine 10 MG tablet Commonly known as: NORVASC Take 1 tablet (10 mg total) by mouth daily. Start taking on: March 15, 2021 What changed:  medication strength how much to take how to take this when to take this additional instructions   atenolol 50 MG tablet Commonly known as: TENORMIN Take 1 tablet by mouth twice daily   calcitonin (salmon) 200 UNIT/ACT nasal spray Commonly known as: MIACALCIN/FORTICAL Place 1 spray into alternate nostrils daily. Start taking on: March 15, 2021   docusate sodium 100 MG capsule Commonly known as: COLACE Take 2 capsules (200 mg total) by mouth 2 (two) times daily.   hydrochlorothiazide 12.5 MG tablet Commonly known as: HYDRODIURIL Take 1 tablet (12.5 mg total) by mouth daily. Start taking on: March 15, 2021  What changed:  medication strength how much to take   ipratropium 0.03 % nasal spray Commonly known as: ATROVENT Place 2 sprays into both nostrils every 12 (twelve) hours.   isosorbide mononitrate 60 MG 24 hr tablet Commonly known as: IMDUR Take 60 mg by mouth 2 (two) times daily.   lidocaine 5 % Commonly known as: Lidoderm Place 1 patch onto the skin every 12 (twelve) hours as needed for up to 10 days. Remove & Discard patch after 12 hours of wear each day.   lisinopril 20 MG tablet Commonly known as: ZESTRIL Take 2 tablets by mouth twice daily   meclizine 25 MG tablet Commonly known as: ANTIVERT Take 1 tablet by mouth three times daily as needed   mometasone 0.1 % cream Commonly known as: ELOCON Apply once ot twice daily as needed for itching   nitroGLYCERIN 0.4 MG SL tablet Commonly known as: NITROSTAT Place 0.4 mg under the tongue every 5 (five) minutes as needed for chest pain.   nystatin 100000 UNIT/ML suspension Commonly known as: MYCOSTATIN Take 5  mLs (500,000 Units total) by mouth 4 (four) times daily for 10 days.   omeprazole 20 MG capsule Commonly known as: PRILOSEC Take 1 capsule by mouth once daily   OneTouch Delica Plus [id number] Misc Inject 2 Devices into the skin daily. DX Code E11.9.   OneTouch Ultra test strip Generic drug: glucose blood USE 1 STRIP TO CHECK GLUCOSE TWICE DAILY   glucose blood test strip Commonly known as: ONE TOUCH ULTRA TEST USE 1 STRIP TO CHECK GLUCOSE TWICE DAILY  Dx code: E11.9   glucose blood test strip Commonly known as: ONE TOUCH ULTRA TEST USE ONE STRIP TO CHECK GLUCOSE ONCE  DAILY   Polyethyl Glycol-Propyl Glycol 0.4-0.3 % Soln Place 1 drop into both eyes 3 (three) times daily as needed (for dry/irritated eyes.).   polyethylene glycol 17 g packet Commonly known as: MIRALAX / GLYCOLAX Take 17 g by mouth daily as needed.   psyllium 58.6 % packet Commonly known as: METAMUCIL Take 1 packet by mouth daily as needed (for regularity).   spironolactone 25 MG tablet Commonly known as: ALDACTONE Take 1 tablet (25 mg total) by mouth daily. Start taking on: March 15, 2021   traMADol 50 MG tablet Commonly known as: Ultram Take 1 tablet (50 mg total) by mouth every 12 (twelve) hours as needed for severe pain. What changed:  when to take this reasons to take this   triamcinolone cream 0.1 % Commonly known as: KENALOG APPLY 1 APPLICATION TOPICALLY TWICE DAILY   Vitamin D3 25 MCG (1000 UT) Caps Take 1,000 Units by mouth every morning.   warfarin 4 MG tablet Commonly known as: COUMADIN Take 4 mg by mouth daily at 6 PM. Does not take Wednesday or Sunday.   warfarin 2 MG tablet Commonly known as: COUMADIN Take 2 mg by mouth 2 (two) times a week. Take 2mg  at bedtime on Wednesday & Sunday.         DISCHARGE INSTRUCTIONS:   Follow-up team at rehab 1 day  If you experience worsening of your admission symptoms, develop shortness of breath, life threatening emergency, suicidal or  homicidal thoughts you must seek medical attention immediately by calling 911 or calling your MD immediately  if symptoms less severe.  You Must read complete instructions/literature along with all the possible adverse reactions/side effects for all the Medicines you take and that have been prescribed to you. Take any new Medicines after you have  completely understood and accept all the possible adverse reactions/side effects.   Please note  You were cared for by a hospitalist during your hospital stay. If you have any questions about your discharge medications or the care you received while you were in the hospital after you are discharged, you can call the unit and asked to speak with the hospitalist on call if the hospitalist that took care of you is not available. Once you are discharged, your primary care physician will handle any further medical issues. Please note that NO REFILLS for any discharge medications will be authorized once you are discharged, as it is imperative that you return to your primary care physician (or establish a relationship with a primary care physician if you do not have one) for your aftercare needs so that they can reassess your need for medications and monitor your lab values.    Today   CHIEF COMPLAINT:   Chief Complaint  Patient presents with   Back Pain    HISTORY OF PRESENT ILLNESS:  Leah Holmes  is a 87 y.o. female came in with back pain and accelerated hypertension   VITAL SIGNS:  Blood pressure (!) 179/80, pulse 62, temperature 98.4 F (36.9 C), resp. rate 17, height 5\' 6"  (1.676 m), weight 56.7 kg, SpO2 96 %.  I/O:   Intake/Output Summary (Last 24 hours) at 03/14/2021 1119 Last data filed at 03/14/2021 1027 Gross per 24 hour  Intake 360 ml  Output --  Net 360 ml    PHYSICAL EXAMINATION:  GENERAL:  87 y.o.-year-old patient lying in the bed with no acute distress.  EYES: Pupils equal, round, reactive to light and accommodation. No scleral  icterus. Extraocular muscles intact.  HEENT: Head atraumatic, normocephalic. Oropharynx and nasopharynx clear.  LUNGS: Normal breath sounds bilaterally, no wheezing, rales,rhonchi or crepitation. No use of accessory muscles of respiration.  CARDIOVASCULAR: S1, S2 normal. No murmurs, rubs, or gallops.  ABDOMEN: Soft, non-tender, non-distended. Bowel sounds present. No organomegaly or mass.  EXTREMITIES: No pedal edema, cyanosis, or clubbing.  NEUROLOGIC: Cranial nerves II through XII are intact. Muscle strength 5/5 in all extremities.  Patient able to straight leg raise bilaterally.  Sensation intact. Gait not checked.  PSYCHIATRIC: The patient is alert and answers questions appropriately.  SKIN: No obvious rash, lesion, or ulcer.   DATA REVIEW:   CBC Recent Labs  Lab 03/12/21 0642  WBC 5.9  HGB 15.4*  HCT 46.1*  PLT 220    Chemistries  Recent Labs  Lab 03/10/21 1455 03/11/21 0750 03/14/21 0546  NA 134*   < > 133*  K 3.6   < > 3.8  CL 98   < > 96*  CO2 26   < > 31  GLUCOSE 190*   < > 159*  BUN 12   < > 12  CREATININE 0.58   < > 0.48  CALCIUM 9.3   < > 8.8*  MG  --   --  1.8  AST 27  --   --   ALT 21  --   --   ALKPHOS 68  --   --   BILITOT 1.1  --   --    < > = values in this interval not displayed.    Microbiology Results  Results for orders placed or performed during the hospital encounter of 03/10/21  SARS CORONAVIRUS 2 (TAT 6-24 HRS) Nasopharyngeal Nasopharyngeal Swab     Status: None   Collection Time: 03/11/21  3:50 AM  Specimen: Nasopharyngeal Swab  Result Value Ref Range Status   SARS Coronavirus 2 NEGATIVE NEGATIVE Final    Comment: (NOTE) SARS-CoV-2 target nucleic acids are NOT DETECTED.  The SARS-CoV-2 RNA is generally detectable in upper and lower respiratory specimens during the acute phase of infection. Negative results do not preclude SARS-CoV-2 infection, do not rule out co-infections with other pathogens, and should not be used as the sole  basis for treatment or other patient management decisions. Negative results must be combined with clinical observations, patient history, and epidemiological information. The expected result is Negative.  Fact Sheet for Patients: SugarRoll.be  Fact Sheet for Healthcare Providers: https://www.Pl[employer]-mathews.com/  This test is not yet approved or cleared by the [place] FDA and  has been authorized for detection and/or diagnosis of SARS-CoV-2 by FDA under an Emergency Use Authorization (EUA). This EUA will remain  in effect (meaning this test can be used) for the duration of the COVID-19 declaration under Se ction 564(b)(1) of the Act, 21 U.S.C. section 360bbb-3(b)(1), unless the authorization is terminated or revoked sooner.  Performed at [facility], [address] [place], [place] [address]      Management plans discussed with the patient, family and they are in agreement.  CODE STATUS:     Code Status Orders  (From admission, onward)           Start     Ordered   03/14/21 0900  Do not attempt resuscitation (DNR)  Continuous       Question Answer Comment  In the event of cardiac or respiratory ARREST Do not call a "code blue"   In the event of cardiac or respiratory ARREST Do not perform Intubation, CPR, defibrillation or ACLS   In the event of cardiac or respiratory ARREST Use medication by any route, position, wound care, and other measures to relive pain and suffering. May use oxygen, suction and manual treatment of airway obstruction as needed for comfort.   Comments nurse may pronounce      03/14/21 0859           Code Status History     Date Active Date Inactive Code Status Order ID Comments User Context   03/10/2021 1953 03/14/2021 0859 Full Code [id number]  Criss Alvine, DO ED   01/28/2016 1521 01/29/2016 1400 Full Code [id number]  Isaias Cowman, MD Inpatient   01/09/2016 1456 01/10/2016 1549 Full Code  [id number]  Vaughan Basta, MD ED       TOTAL TIME TAKING CARE OF THIS PATIENT: 32 minutes.    Loletha Grayer M.D on 03/14/2021 at 11:19 AM  [facility]  CC: Primary care physician; Crecencio Mc, MD

## 2021-03-14 NOTE — Progress Notes (Signed)
ANTICOAGULATION CONSULT NOTE - Follow Up Consult  Pharmacy Consult for  Warfarin Indication: atrial fibrillation  Allergies  Allergen Reactions   Codeine Anaphylaxis   Penicillins Anaphylaxis    Has patient had a PCN reaction causing immediate rash, facial/tongue/throat swelling, SOB or lightheadedness with hypotension: Yes Has patient had a PCN reaction causing severe rash involving mucus membranes or skin necrosis: No Has patient had a PCN reaction that required hospitalization Yes Has patient had a PCN reaction occurring within the last 10 years: No If all of the above answers are "NO", then may proceed with Cephalosporin use.   Amlodipine Other (See Comments)    Diffuse itching    Atorvastatin     Other reaction(s): Unknown   Clonidine Derivatives Other (See Comments)    Reaction:  Unknown    Gabapentin Nausea Only   Losartan Other (See Comments)    Other reaction(s): Other (See Comments) Reaction:  Muscle aches  Reaction:  Muscle aches    Morphine     Other reaction(s): Unknown   Morphine And Related Other (See Comments)    Reaction:  Unknown    Reglan [Metoclopramide] Other (See Comments)    Reaction:  Tremors    Hydralazine Anxiety    Patient Measurements:  Vital Signs: Temp: 98.2 F (36.8 C) (09/30 0608) BP: 154/77 (09/30 0608) Pulse Rate: 60 (09/30 0608)  Labs: Recent Labs    03/11/21 0750 03/12/21 0642 03/13/21 0650 03/14/21 0546  HGB 15.5* 15.4*  --   --   HCT 45.0 46.1*  --   --   PLT 208 220  --   --   LABPROT 33.1* 38.7* 35.7* 28.5*  INR 3.2* 4.0* 3.6* 2.7*  CREATININE 0.66 0.53  --  0.48     Estimated Creatinine Clearance: 40.2 mL/min (by C-G formula based on SCr of 0.48 mg/dL).   Medications:  PTA warfarin 4 mg daily at 6 pm (except wednesday or Sunday) and 2 mg twice per week on Wednesday and Sunday  Assessment: 87yo Female w/ h/o CHF, Afib, breast cancer, CAD, HLD, HTN, & ulcers presenting from clinic with CC of back pain. CT scan c/w  L2 Compression fracture. Pharmacy consulted for the mgmt of warfarin dosing while inpatient.   INR remains supratherapeutic with INR goal range of 2-3. No current DDI's at this time.  Date Time INR Dose/Comment 9/24 2000 Unk PTA dose 4mg  9/25 2000 Unk PTA dose 2mg  9/26  2150 3.3  2mg  x1 9/27    0827    3.2       2mg  x1 9/28    0727    4.0       HOLD 9/29 0650 3.6 HOLD 9/30 0600 2.7 4 mg x1     Goal of Therapy:  INR 2-3 Monitor platelets by anticoagulation protocol: Yes   Plan:  Resume home regimen with 4 mg dose x 1 tonight  Daily INR x 5 already ordered by provider CBC stable Will continue daily follow up and warfarin adjustment as needed  Dorothe Pea, PharmD, BCPS Clinical Pharmacist   03/14/2021 7:37 AM

## 2021-03-18 DIAGNOSIS — F339 Major depressive disorder, recurrent, unspecified: Secondary | ICD-10-CM | POA: Diagnosis not present

## 2021-03-18 DIAGNOSIS — R5381 Other malaise: Secondary | ICD-10-CM | POA: Diagnosis not present

## 2021-03-18 DIAGNOSIS — I480 Paroxysmal atrial fibrillation: Secondary | ICD-10-CM | POA: Diagnosis not present

## 2021-03-18 DIAGNOSIS — I509 Heart failure, unspecified: Secondary | ICD-10-CM | POA: Diagnosis not present

## 2021-03-18 DIAGNOSIS — I495 Sick sinus syndrome: Secondary | ICD-10-CM | POA: Diagnosis not present

## 2021-03-18 DIAGNOSIS — K589 Irritable bowel syndrome without diarrhea: Secondary | ICD-10-CM | POA: Diagnosis not present

## 2021-03-18 DIAGNOSIS — E559 Vitamin D deficiency, unspecified: Secondary | ICD-10-CM | POA: Diagnosis not present

## 2021-03-18 DIAGNOSIS — M810 Age-related osteoporosis without current pathological fracture: Secondary | ICD-10-CM | POA: Diagnosis not present

## 2021-03-18 DIAGNOSIS — R251 Tremor, unspecified: Secondary | ICD-10-CM | POA: Diagnosis not present

## 2021-03-18 DIAGNOSIS — E43 Unspecified severe protein-calorie malnutrition: Secondary | ICD-10-CM | POA: Diagnosis not present

## 2021-03-18 DIAGNOSIS — K5900 Constipation, unspecified: Secondary | ICD-10-CM | POA: Diagnosis not present

## 2021-03-18 DIAGNOSIS — S32020A Wedge compression fracture of second lumbar vertebra, initial encounter for closed fracture: Secondary | ICD-10-CM | POA: Diagnosis not present

## 2021-03-18 DIAGNOSIS — E1142 Type 2 diabetes mellitus with diabetic polyneuropathy: Secondary | ICD-10-CM | POA: Diagnosis not present

## 2021-03-18 DIAGNOSIS — H04129 Dry eye syndrome of unspecified lacrimal gland: Secondary | ICD-10-CM | POA: Diagnosis not present

## 2021-03-18 DIAGNOSIS — C50912 Malignant neoplasm of unspecified site of left female breast: Secondary | ICD-10-CM | POA: Diagnosis not present

## 2021-03-18 DIAGNOSIS — E782 Mixed hyperlipidemia: Secondary | ICD-10-CM | POA: Diagnosis not present

## 2021-03-18 DIAGNOSIS — K219 Gastro-esophageal reflux disease without esophagitis: Secondary | ICD-10-CM | POA: Diagnosis not present

## 2021-03-18 DIAGNOSIS — I10 Essential (primary) hypertension: Secondary | ICD-10-CM | POA: Diagnosis not present

## 2021-03-18 DIAGNOSIS — F411 Generalized anxiety disorder: Secondary | ICD-10-CM | POA: Diagnosis not present

## 2021-03-18 DIAGNOSIS — E876 Hypokalemia: Secondary | ICD-10-CM | POA: Diagnosis not present

## 2021-03-18 DIAGNOSIS — I2510 Atherosclerotic heart disease of native coronary artery without angina pectoris: Secondary | ICD-10-CM | POA: Diagnosis not present

## 2021-03-18 LAB — ALDOSTERONE + RENIN ACTIVITY W/ RATIO
ALDO / PRA Ratio: 37.8 — ABNORMAL HIGH (ref 0.0–30.0)
Aldosterone: 8.7 ng/dL (ref 0.0–30.0)
PRA LC/MS/MS: 0.23 ng/mL/hr (ref 0.167–5.380)

## 2021-03-25 ENCOUNTER — Telehealth: Payer: Self-pay | Admitting: Internal Medicine

## 2021-03-25 NOTE — Telephone Encounter (Signed)
Rejection Reason - Patient Declined" Ancil Boozer said on Mar 25, 2021 3:24 PM  Msg from [facility] physiatry physical medicine and rehab

## 2021-04-02 DIAGNOSIS — R059 Cough, unspecified: Secondary | ICD-10-CM | POA: Diagnosis not present

## 2021-04-02 DIAGNOSIS — S32020A Wedge compression fracture of second lumbar vertebra, initial encounter for closed fracture: Secondary | ICD-10-CM | POA: Diagnosis not present

## 2021-04-02 DIAGNOSIS — R509 Fever, unspecified: Secondary | ICD-10-CM | POA: Diagnosis not present

## 2021-04-02 DIAGNOSIS — R5381 Other malaise: Secondary | ICD-10-CM | POA: Diagnosis not present

## 2021-04-07 DIAGNOSIS — R768 Other specified abnormal immunological findings in serum: Secondary | ICD-10-CM | POA: Diagnosis not present

## 2021-04-07 DIAGNOSIS — S32020A Wedge compression fracture of second lumbar vertebra, initial encounter for closed fracture: Secondary | ICD-10-CM | POA: Diagnosis not present

## 2021-04-07 DIAGNOSIS — M62830 Muscle spasm of back: Secondary | ICD-10-CM | POA: Diagnosis not present

## 2021-04-11 ENCOUNTER — Telehealth: Payer: Self-pay | Admitting: Internal Medicine

## 2021-04-11 DIAGNOSIS — R768 Other specified abnormal immunological findings in serum: Secondary | ICD-10-CM | POA: Diagnosis not present

## 2021-04-11 DIAGNOSIS — I10 Essential (primary) hypertension: Secondary | ICD-10-CM | POA: Diagnosis not present

## 2021-04-11 DIAGNOSIS — K219 Gastro-esophageal reflux disease without esophagitis: Secondary | ICD-10-CM | POA: Diagnosis not present

## 2021-04-11 DIAGNOSIS — M62830 Muscle spasm of back: Secondary | ICD-10-CM | POA: Diagnosis not present

## 2021-04-11 DIAGNOSIS — S32020A Wedge compression fracture of second lumbar vertebra, initial encounter for closed fracture: Secondary | ICD-10-CM | POA: Diagnosis not present

## 2021-04-11 DIAGNOSIS — I2510 Atherosclerotic heart disease of native coronary artery without angina pectoris: Secondary | ICD-10-CM | POA: Diagnosis not present

## 2021-04-11 DIAGNOSIS — M545 Low back pain, unspecified: Secondary | ICD-10-CM | POA: Diagnosis not present

## 2021-04-11 DIAGNOSIS — I480 Paroxysmal atrial fibrillation: Secondary | ICD-10-CM | POA: Diagnosis not present

## 2021-04-11 NOTE — Telephone Encounter (Signed)
noted 

## 2021-04-11 NOTE — Telephone Encounter (Signed)
Pt is discharging from [facility]. [facility] will be seeing pt. Pt has a upcoming appt no need for a Home health referral. It's already been done. Please advise and Thank you!

## 2021-04-17 DIAGNOSIS — E43 Unspecified severe protein-calorie malnutrition: Secondary | ICD-10-CM | POA: Diagnosis not present

## 2021-04-17 DIAGNOSIS — Z950 Presence of cardiac pacemaker: Secondary | ICD-10-CM | POA: Diagnosis not present

## 2021-04-17 DIAGNOSIS — R42 Dizziness and giddiness: Secondary | ICD-10-CM | POA: Diagnosis not present

## 2021-04-17 DIAGNOSIS — M6259 Muscle wasting and atrophy, not elsewhere classified, multiple sites: Secondary | ICD-10-CM | POA: Diagnosis not present

## 2021-04-17 DIAGNOSIS — U071 COVID-19: Secondary | ICD-10-CM | POA: Diagnosis not present

## 2021-04-17 DIAGNOSIS — I495 Sick sinus syndrome: Secondary | ICD-10-CM | POA: Diagnosis not present

## 2021-04-17 DIAGNOSIS — Z87440 Personal history of urinary (tract) infections: Secondary | ICD-10-CM | POA: Diagnosis not present

## 2021-04-17 DIAGNOSIS — K5900 Constipation, unspecified: Secondary | ICD-10-CM | POA: Diagnosis not present

## 2021-04-17 DIAGNOSIS — M62830 Muscle spasm of back: Secondary | ICD-10-CM | POA: Diagnosis not present

## 2021-04-17 DIAGNOSIS — K219 Gastro-esophageal reflux disease without esophagitis: Secondary | ICD-10-CM | POA: Diagnosis not present

## 2021-04-17 DIAGNOSIS — E876 Hypokalemia: Secondary | ICD-10-CM | POA: Diagnosis not present

## 2021-04-17 DIAGNOSIS — I509 Heart failure, unspecified: Secondary | ICD-10-CM | POA: Diagnosis not present

## 2021-04-17 DIAGNOSIS — M810 Age-related osteoporosis without current pathological fracture: Secondary | ICD-10-CM | POA: Diagnosis not present

## 2021-04-17 DIAGNOSIS — Z9181 History of falling: Secondary | ICD-10-CM | POA: Diagnosis not present

## 2021-04-17 DIAGNOSIS — E1142 Type 2 diabetes mellitus with diabetic polyneuropathy: Secondary | ICD-10-CM | POA: Diagnosis not present

## 2021-04-17 DIAGNOSIS — H04129 Dry eye syndrome of unspecified lacrimal gland: Secondary | ICD-10-CM | POA: Diagnosis not present

## 2021-04-17 DIAGNOSIS — E782 Mixed hyperlipidemia: Secondary | ICD-10-CM | POA: Diagnosis not present

## 2021-04-17 DIAGNOSIS — F331 Major depressive disorder, recurrent, moderate: Secondary | ICD-10-CM | POA: Diagnosis not present

## 2021-04-17 DIAGNOSIS — I110 Hypertensive heart disease with heart failure: Secondary | ICD-10-CM | POA: Diagnosis not present

## 2021-04-17 DIAGNOSIS — Z853 Personal history of malignant neoplasm of breast: Secondary | ICD-10-CM | POA: Diagnosis not present

## 2021-04-17 DIAGNOSIS — I480 Paroxysmal atrial fibrillation: Secondary | ICD-10-CM | POA: Diagnosis not present

## 2021-04-17 DIAGNOSIS — J309 Allergic rhinitis, unspecified: Secondary | ICD-10-CM | POA: Diagnosis not present

## 2021-04-17 DIAGNOSIS — K589 Irritable bowel syndrome without diarrhea: Secondary | ICD-10-CM | POA: Diagnosis not present

## 2021-04-17 DIAGNOSIS — F411 Generalized anxiety disorder: Secondary | ICD-10-CM | POA: Diagnosis not present

## 2021-04-17 DIAGNOSIS — S32020D Wedge compression fracture of second lumbar vertebra, subsequent encounter for fracture with routine healing: Secondary | ICD-10-CM | POA: Diagnosis not present

## 2021-04-22 DIAGNOSIS — E1142 Type 2 diabetes mellitus with diabetic polyneuropathy: Secondary | ICD-10-CM | POA: Diagnosis not present

## 2021-04-22 DIAGNOSIS — S32020D Wedge compression fracture of second lumbar vertebra, subsequent encounter for fracture with routine healing: Secondary | ICD-10-CM | POA: Diagnosis not present

## 2021-04-22 DIAGNOSIS — I509 Heart failure, unspecified: Secondary | ICD-10-CM | POA: Diagnosis not present

## 2021-04-22 DIAGNOSIS — U071 COVID-19: Secondary | ICD-10-CM | POA: Diagnosis not present

## 2021-04-22 DIAGNOSIS — I482 Chronic atrial fibrillation, unspecified: Secondary | ICD-10-CM | POA: Diagnosis not present

## 2021-04-22 DIAGNOSIS — I495 Sick sinus syndrome: Secondary | ICD-10-CM | POA: Diagnosis not present

## 2021-04-22 DIAGNOSIS — M62830 Muscle spasm of back: Secondary | ICD-10-CM | POA: Diagnosis not present

## 2021-04-22 DIAGNOSIS — I110 Hypertensive heart disease with heart failure: Secondary | ICD-10-CM | POA: Diagnosis not present

## 2021-04-24 ENCOUNTER — Other Ambulatory Visit: Payer: Self-pay

## 2021-04-24 ENCOUNTER — Ambulatory Visit (INDEPENDENT_AMBULATORY_CARE_PROVIDER_SITE_OTHER): Payer: Medicare Other | Admitting: Internal Medicine

## 2021-04-24 ENCOUNTER — Encounter: Payer: Self-pay | Admitting: Internal Medicine

## 2021-04-24 VITALS — BP 134/60 | HR 71 | Temp 96.8°F | Ht 66.0 in | Wt 121.0 lb

## 2021-04-24 DIAGNOSIS — D6869 Other thrombophilia: Secondary | ICD-10-CM | POA: Diagnosis not present

## 2021-04-24 DIAGNOSIS — I10 Essential (primary) hypertension: Secondary | ICD-10-CM | POA: Diagnosis not present

## 2021-04-24 DIAGNOSIS — Z8616 Personal history of COVID-19: Secondary | ICD-10-CM | POA: Diagnosis not present

## 2021-04-24 DIAGNOSIS — R7989 Other specified abnormal findings of blood chemistry: Secondary | ICD-10-CM | POA: Diagnosis not present

## 2021-04-24 DIAGNOSIS — E871 Hypo-osmolality and hyponatremia: Secondary | ICD-10-CM | POA: Diagnosis not present

## 2021-04-24 DIAGNOSIS — E876 Hypokalemia: Secondary | ICD-10-CM

## 2021-04-24 DIAGNOSIS — E1142 Type 2 diabetes mellitus with diabetic polyneuropathy: Secondary | ICD-10-CM

## 2021-04-24 DIAGNOSIS — R634 Abnormal weight loss: Secondary | ICD-10-CM | POA: Diagnosis not present

## 2021-04-24 DIAGNOSIS — M8080XS Other osteoporosis with current pathological fracture, unspecified site, sequela: Secondary | ICD-10-CM | POA: Diagnosis not present

## 2021-04-24 DIAGNOSIS — E559 Vitamin D deficiency, unspecified: Secondary | ICD-10-CM

## 2021-04-24 LAB — BASIC METABOLIC PANEL
BUN: 15 mg/dL (ref 6–23)
CO2: 30 mEq/L (ref 19–32)
Calcium: 9.1 mg/dL (ref 8.4–10.5)
Chloride: 97 mEq/L (ref 96–112)
Creatinine, Ser: 0.59 mg/dL (ref 0.40–1.20)
GFR: 78.11 mL/min (ref >60.00)
Glucose, Bld: 199 mg/dL — ABNORMAL HIGH (ref 70–99)
Potassium: 4.7 mEq/L (ref 3.5–5.1)
Sodium: 131 mEq/L — ABNORMAL LOW (ref 135–145)

## 2021-04-24 LAB — VITAMIN D 25 HYDROXY (VIT D DEFICIENCY, FRACTURES): VITD: 17.71 ng/mL — ABNORMAL LOW (ref 30.00–100.00)

## 2021-04-24 NOTE — Progress Notes (Signed)
Subjective:  Patient ID: Leah Holmes, female    DOB: May 03, 1927  Age: 87 y.o. MRN: [id number]  CC: The primary encounter diagnosis was Vitamin D deficiency. Diagnoses of Hypokalemia, Unintentional weight loss of 7.5% body weight or less within 3 months, Acquired thrombophilia ([place]), Essential hypertension, benign, History of COVID-19, Low serum vitamin D, and Localized osteoporosis with current pathological fracture, sequela were also pertinent to this visit.  HPI Leah Holmes presents for  Chief Complaint  Patient presents with   Follow-up    Leaving facilility   This visit occurred during the SARS-CoV-2 public health emergency.  Safety protocols were in place, including screening questions prior to the visit, additional usage of staff PPE, and extensive cleaning of exam room while observing appropriate contact time as indicated for disinfecting solutions.   Leah Holmes is a 87 yr odl female with hypertension, type 2 DM and osteoporosis who was admitted  TO [facility] [facility] WITH a subacute L2 COMPRESSION FRACTURE AND HYPERTENSIVE URGENCY .  She was treated with a nicardipine drip and transitioned to her hoe regimen with dose changes as follows:    Norvasc increased to 10 mg.  Hydrochlorothiazide decreased to 12.5 mg.  Aldactone started and increased up to 12.5 mg.  Renin and aldosterone levels were drawn and her ratio was elevated.  She has not taken the spironolactone or hctz in the past week and is not sure how often she has been taking potassium since leaving the facility   Acute to subacute compression fracture L2.  Lumbar corset ordered.  Physical therapy recommending rehab.  Pain control with as needed tramadol.  Try to convert to Tylenol as quickly as possible.  Prescribed Lidoderm patch also.  Did not tolerated tramadol due to slurring of speech and constipation  Miacalcin nasal spray for pain.  Slight slurred speech which cleared up,  thrush. Using tylenol only.   Getting home PT>  ambulating with a walker   Thrush : was treated with nystatin oral suspension to resolution   PAF:  managed with atenolol for rate control.  Patient was therapeutic on Coumadin.  History of breast cancer.  Status postlumpectomy 2021  Hypokalemia.  Attributed to hctz vs hyperaldosteronism?  Hospitalist reduced hctz and added spironolactone.  Electrolytes need rechecking today   Came home one week ago with COVID and gave to Cath and both have recovered.  Back paitylnn is tolerable.  Some pain on right side of ribs.   Taking tyenol daily  .  Getting home PT twice daily .  Tolerating it well.  Using a walker at home.   Having trouble laying flat on her back due to pain   Taking calcitonin   Unintentional weight loss  Using Boost every day to manage the 10 lb weight loss.    Some constipation managed with prune juice.  \not sleeping well due to nocturia . Discussed adding melatonin at dinnertime   Outpatient Medications Prior to Visit  Medication Sig Dispense Refill   acetaminophen (TYLENOL) 325 MG tablet Take 2 tablets (650 mg total) by mouth every 6 (six) hours as needed.     albuterol (PROAIR HFA) 108 (90 Base) MCG/ACT inhaler Inhale 1-2 puffs into the lungs every 4 (four) hours as needed for wheezing or shortness of breath. 1 Inhaler 5   amLODipine (NORVASC) 10 MG tablet Take 1 tablet (10 mg total) by mouth daily. 30 tablet 0   atenolol (TENORMIN) 50 MG tablet Take 1 tablet by mouth twice  daily 180 tablet 0   calcitonin, salmon, (MIACALCIN/FORTICAL) 200 UNIT/ACT nasal spray Place 1 spray into alternate nostrils daily. 3.7 mL 0   Cholecalciferol (VITAMIN D3) 1000 UNITS CAPS Take 1,000 Units by mouth every morning.      docusate sodium (COLACE) 100 MG capsule Take 2 capsules (200 mg total) by mouth 2 (two) times daily. 60 capsule 0   glucose blood (ONE TOUCH ULTRA TEST) test strip USE 1 STRIP TO CHECK GLUCOSE TWICE DAILY  Dx code: E11.9 200 each 3   glucose blood (ONE TOUCH  ULTRA TEST) test strip USE ONE STRIP TO CHECK GLUCOSE ONCE  DAILY 100 each 3   hydrochlorothiazide (HYDRODIURIL) 12.5 MG tablet Take 1 tablet (12.5 mg total) by mouth daily. 30 tablet 0   ipratropium (ATROVENT) 0.03 % nasal spray Place 2 sprays into both nostrils every 12 (twelve) hours. 30 mL 12   isosorbide mononitrate (IMDUR) 60 MG 24 hr tablet Take 60 mg by mouth 2 (two) times daily.     Lancets (ONETOUCH DELICA PLUS [id number]) MISC Inject 2 Devices into the skin daily. DX Code E11.9. 200 each 2   lisinopril (ZESTRIL) 20 MG tablet Take 2 tablets by mouth twice daily 180 tablet 0   meclizine (ANTIVERT) 25 MG tablet Take 1 tablet by mouth three times daily as needed 90 tablet 0   mometasone (ELOCON) 0.1 % cream Apply once ot twice daily as needed for itching 15 g 1   nitroGLYCERIN (NITROSTAT) 0.4 MG SL tablet Place 0.4 mg under the tongue every 5 (five) minutes as needed for chest pain.     omeprazole (PRILOSEC) 20 MG capsule Take 1 capsule by mouth once daily 90 capsule 0   ONETOUCH ULTRA test strip USE 1 STRIP TO CHECK GLUCOSE TWICE DAILY 100 each 0   Polyethyl Glycol-Propyl Glycol 0.4-0.3 % SOLN Place 1 drop into both eyes 3 (three) times daily as needed (for dry/irritated eyes.).     polyethylene glycol (MIRALAX / GLYCOLAX) 17 g packet Take 17 g by mouth daily as needed.     psyllium (METAMUCIL) 58.6 % packet Take 1 packet by mouth daily as needed (for regularity).      spironolactone (ALDACTONE) 25 MG tablet Take 1 tablet (25 mg total) by mouth daily. 30 tablet 0   traMADol (ULTRAM) 50 MG tablet Take 1 tablet (50 mg total) by mouth every 12 (twelve) hours as needed for severe pain. 8 tablet 0   triamcinolone cream (KENALOG) 0.1 % APPLY 1 APPLICATION TOPICALLY TWICE DAILY 30 g 0   warfarin (COUMADIN) 2 MG tablet Take 2 mg by mouth 2 (two) times a week. Take 2mg  at bedtime on Wednesday & Sunday.     warfarin (COUMADIN) 4 MG tablet Take 4 mg by mouth daily at 6 PM. Does not take Wednesday or  Sunday.     No facility-administered medications prior to visit.    Review of Systems;  Patient denies headache, fevers, malaise, unintentional weight loss, skin rash, eye pain, sinus congestion and sinus pain, sore throat, dysphagia,  hemoptysis , cough, dyspnea, wheezing, chest pain, palpitations, orthopnea, edema, abdominal pain, nausea, melena, diarrhea, constipation, flank pain, dysuria, hematuria, urinary  Frequency, nocturia, numbness, tingling, seizures,  Focal weakness, Loss of consciousness,  Tremor, insomnia, depression, anxiety, and suicidal ideation.      Objective:  BP 134/60   Pulse 71   Temp (!) 96.8 F (36 C) (Skin)   Ht 5\' 6"  (1.676 m)   Wt 121 lb (54.9  kg)   SpO2 97%   BMI 19.53 kg/m   BP Readings from Last 3 Encounters:  04/24/21 134/60  03/14/21 138/63  03/10/21 (!) 182/82    Wt Readings from Last 3 Encounters:  04/24/21 121 lb (54.9 kg)  03/12/21 125 lb (56.7 kg)  03/05/21 127 lb (57.6 kg)    General appearance: alert, cooperative and appears stated age Ears: normal TM's and external ear canals both ears Throat: lips, mucosa, and tongue normal; teeth and gums normal Neck: no adenopathy, no carotid bruit, supple, symmetrical, trachea midline and thyroid not enlarged, symmetric, no tenderness/mass/nodules Back: symmetric, no curvature. ROM normal. No CVA tenderness. Lungs: clear to auscultation bilaterally Heart: regular rate and rhythm, S1, S2 normal, no murmur, click, rub or gallop Abdomen: soft, non-tender; bowel sounds normal; no masses,  no organomegaly Pulses: 2+ and symmetric Skin: Skin color, texture, turgor normal. No rashes or lesions Lymph nodes: Cervical, supraclavicular, and axillary nodes normal.  Lab Results  Component Value Date   HGBA1C 6.7 (H) 09/13/2020   HGBA1C 6.8 (H) 02/23/2020   HGBA1C 7.0 (H) 09/06/2019    Lab Results  Component Value Date   CREATININE 0.48 03/14/2021   CREATININE 0.53 03/12/2021   CREATININE 0.66  03/11/2021    Lab Results  Component Value Date   WBC 5.9 03/12/2021   HGB 15.4 (H) 03/12/2021   HCT 46.1 (H) 03/12/2021   PLT 220 03/12/2021   GLUCOSE 159 (H) 03/14/2021   CHOL 174 09/13/2020   TRIG 63.0 09/13/2020   HDL 60.10 09/13/2020   LDLDIRECT 116.0 10/30/2015   LDLCALC 101 (H) 09/13/2020   ALT 21 03/10/2021   AST 27 03/10/2021   NA 133 (L) 03/14/2021   K 3.8 03/14/2021   CL 96 (L) 03/14/2021   CREATININE 0.48 03/14/2021   BUN 12 03/14/2021   CO2 31 03/14/2021   TSH 1.187 03/10/2021   INR 2.7 (H) 03/14/2021   HGBA1C 6.7 (H) 09/13/2020   MICROALBUR 1.0 02/23/2020    CT ABDOMEN PELVIS W CONTRAST  Result Date: 03/10/2021 CLINICAL DATA:  Left abdominal pain.  Back pain. EXAM: CT ABDOMEN AND PELVIS WITH CONTRAST TECHNIQUE: Multidetector CT imaging of the abdomen and pelvis was performed using the standard protocol following bolus administration of intravenous contrast. CONTRAST:  24mL OMNIPAQUE IOHEXOL 350 MG/ML SOLN COMPARISON:  Chest CTA 03/07/2018 and 01/11/2018 FINDINGS: Lower chest: Multiple small sub solid opacities in the periphery of the lower lungs. Largest opacity measures roughly 5 mm on sequence 4, image 14. Some of these peripheral opacities are chronic since 2019 but others may be new. Cardiac lead in the right ventricle. Trace pleural fluid versus minimal pleural thickening, left side greater than right. Hepatobiliary: Small focal area of enhancement in the lateral left hepatic lobe is unchanged since 2019 and most compatible with an incidental finding. Cholecystectomy. No biliary dilatation. Pancreas: Unremarkable. No pancreatic ductal dilatation or surrounding inflammatory changes. Spleen: Normal in size without focal abnormality. Adrenals/Urinary Tract: Normal adrenal glands. Bilateral renal cysts. Normal appearance of the urinary bladder. No hydronephrosis. Stomach/Bowel: Question polyp or ingested tablet in the sigmoid colon on sequence 2 image 62. Normal  appearance of the stomach. No evidence for bowel inflammation. No evidence for a bowel obstruction. Vascular/Lymphatic: No abdominopelvic lymph node enlargement. Diffuse atherosclerotic calcifications. No abdominal aortic aneurysm. Reproductive: Status post hysterectomy. No adnexal masses. Other: Trace pelvic free fluid. Laxity and irregularity along the anterior abdominal wall is likely postoperative in etiology. Musculoskeletal: Compression fracture involving the superior endplate of  L2. This is most compatible with an acute/subacute fracture. No significant bone retropulsion. Approximately 30% loss of height. IMPRESSION: 1. Acute/subacute compression fracture involving the superior endplate of L2. Approximately 30% loss of height. 2. Trace free fluid in the pelvis. There was a similar finding on a exam from 2019. These findings are nonspecific but could be associated with an occult inflammatory process in the abdomen and pelvis. 3. Bilateral renal cysts. 4. Small peripheral opacities at both lung bases. Some of these are chronic. These small opacities are likely postinflammatory in etiology. No follow-up needed if patient is low-risk (and has no known or suspected primary neoplasm). Non-contrast chest CT can be considered in 12 months if patient is high-risk. This recommendation follows the consensus statement: Guidelines for Management of Incidental Pulmonary Nodules Detected on CT Images: From the Fleischner Society 2017; Radiology 2017; 284:228-243. Electronically Signed   By: Markus Daft M.D.   On: 03/10/2021 17:23    Assessment & Plan:   Problem List Items Addressed This Visit     Essential hypertension, benign    With recent hypertensive crisis during admission for back pain.  hyperaldo suggested by renin/angiotension levels drawn during hospitalization.  It is unclear if she has Continued spironolactone or potassium sinc leaving rehab one week ago .  Checking potassium level today       Low serum  vitamin D    With osteoporosis and recent facility stay.  Checking level today , currently taking 1000 Ius daily       Osteoporosis    Historically Untreated due to patient preference ; now with L2 fracture.  Continue miacalcin nasal spray        Acquired thrombophilia ([facility])    Her embolic stroke risk is managed with use of coumadin, INR is managed by Coumad[facility] clinic at [facility].       Unintentional weight loss of 7.5% body weight or less within 3 months    10 lb wt loss addressed with nutritional supplements (Boost one serving daily)      Hypokalemia   Relevant Orders   Basic metabolic panel   History of COVID-19    She has recovered uneventfully from her second infection  Which occurred during her stay in rehab in October 2022 (Last week )      Other Visit Diagnoses     Vitamin D deficiency    -  Primary   Relevant Orders   VITAMIN D 25 Hydroxy (Vit-D Deficiency, Fractures)       I am having Elinda L. Baldus maintain her nitroGLYCERIN, psyllium, Vitamin D3, isosorbide mononitrate, albuterol, Polyethyl Glycol-Propyl Glycol, warfarin, OneTouch Delica Plus [id number], OneTouch Ultra, glucose blood, glucose blood, warfarin, ipratropium, triamcinolone cream, omeprazole, meclizine, mometasone, atenolol, lisinopril, polyethylene glycol, amLODipine, hydrochlorothiazide, traMADol, calcitonin (salmon), docusate sodium, spironolactone, and acetaminophen.  No orders of the defined types were placed in this encounter.   There are no discontinued medications.  Follow-up: Return in about 3 months (around 07/25/2021) for follow up diabetes.   Crecencio Mc, MD

## 2021-04-24 NOTE — Assessment & Plan Note (Signed)
She has recovered uneventfully from her second infection  Which occurred during her stay in rehab in October 2022 (Last week )

## 2021-04-24 NOTE — Assessment & Plan Note (Signed)
Her embolic stroke risk is managed with use of coumadin, INR is managed by Coumadin clinic at [facility].

## 2021-04-24 NOTE — Assessment & Plan Note (Signed)
10 lb wt loss addressed with nutritional supplements (Boost one serving daily)

## 2021-04-24 NOTE — Patient Instructions (Addendum)
Max out the tylenol at 2000 mg daily for your back .  I recommend trying melatonin for your insomnia.  It is not a sedative,  But must be taken on  a regular basis to help your internal clock.  Take every evening after dinner start with 3 mg dose   Max effective dose is 6 mg  Ok to continue miralax for constipation .  I'm glad you are feeling better!

## 2021-04-24 NOTE — Assessment & Plan Note (Signed)
With recent hypertensive crisis during admission for back pain.  hyperaldo suggested by renin/angiotension levels drawn during hospitalization.  It is unclear if she has Continued spironolactone or potassium sinc leaving rehab one week ago .  Checking potassium level today

## 2021-04-24 NOTE — Assessment & Plan Note (Signed)
Historically Untreated due to patient preference ; now with L2 fracture.  Continue miacalcin nasal spray

## 2021-04-24 NOTE — Assessment & Plan Note (Signed)
With osteoporosis and recent facility stay.  Checking level today , currently taking 1000 Ius daily

## 2021-04-25 DIAGNOSIS — U071 COVID-19: Secondary | ICD-10-CM | POA: Diagnosis not present

## 2021-04-25 DIAGNOSIS — S32020D Wedge compression fracture of second lumbar vertebra, subsequent encounter for fracture with routine healing: Secondary | ICD-10-CM | POA: Diagnosis not present

## 2021-04-25 DIAGNOSIS — M62830 Muscle spasm of back: Secondary | ICD-10-CM | POA: Diagnosis not present

## 2021-04-25 DIAGNOSIS — E1142 Type 2 diabetes mellitus with diabetic polyneuropathy: Secondary | ICD-10-CM | POA: Diagnosis not present

## 2021-04-25 DIAGNOSIS — I110 Hypertensive heart disease with heart failure: Secondary | ICD-10-CM | POA: Diagnosis not present

## 2021-04-25 DIAGNOSIS — I509 Heart failure, unspecified: Secondary | ICD-10-CM | POA: Diagnosis not present

## 2021-04-27 MED ORDER — ERGOCALCIFEROL 1.25 MG (50000 UT) PO CAPS: 50000.0000 [IU] | ORAL_CAPSULE | ORAL | 0 refills | Status: DC

## 2021-04-27 NOTE — Addendum Note (Signed)
Addended by: Crecencio Mc on: 04/27/2021 02:39 PM   Modules accepted: Orders

## 2021-04-28 DIAGNOSIS — U071 COVID-19: Secondary | ICD-10-CM | POA: Diagnosis not present

## 2021-04-28 DIAGNOSIS — E1142 Type 2 diabetes mellitus with diabetic polyneuropathy: Secondary | ICD-10-CM | POA: Diagnosis not present

## 2021-04-28 DIAGNOSIS — M62830 Muscle spasm of back: Secondary | ICD-10-CM | POA: Diagnosis not present

## 2021-04-28 DIAGNOSIS — I110 Hypertensive heart disease with heart failure: Secondary | ICD-10-CM | POA: Diagnosis not present

## 2021-04-28 DIAGNOSIS — S32020D Wedge compression fracture of second lumbar vertebra, subsequent encounter for fracture with routine healing: Secondary | ICD-10-CM | POA: Diagnosis not present

## 2021-04-28 DIAGNOSIS — I509 Heart failure, unspecified: Secondary | ICD-10-CM | POA: Diagnosis not present

## 2021-04-29 DIAGNOSIS — E1142 Type 2 diabetes mellitus with diabetic polyneuropathy: Secondary | ICD-10-CM | POA: Diagnosis not present

## 2021-04-29 DIAGNOSIS — E782 Mixed hyperlipidemia: Secondary | ICD-10-CM

## 2021-04-29 DIAGNOSIS — I495 Sick sinus syndrome: Secondary | ICD-10-CM | POA: Diagnosis not present

## 2021-04-29 DIAGNOSIS — M810 Age-related osteoporosis without current pathological fracture: Secondary | ICD-10-CM | POA: Diagnosis not present

## 2021-04-29 DIAGNOSIS — M62830 Muscle spasm of back: Secondary | ICD-10-CM | POA: Diagnosis not present

## 2021-04-29 DIAGNOSIS — Z950 Presence of cardiac pacemaker: Secondary | ICD-10-CM

## 2021-04-29 DIAGNOSIS — J309 Allergic rhinitis, unspecified: Secondary | ICD-10-CM

## 2021-04-29 DIAGNOSIS — F331 Major depressive disorder, recurrent, moderate: Secondary | ICD-10-CM

## 2021-04-29 DIAGNOSIS — E43 Unspecified severe protein-calorie malnutrition: Secondary | ICD-10-CM

## 2021-04-29 DIAGNOSIS — K219 Gastro-esophageal reflux disease without esophagitis: Secondary | ICD-10-CM

## 2021-04-29 DIAGNOSIS — F411 Generalized anxiety disorder: Secondary | ICD-10-CM

## 2021-04-29 DIAGNOSIS — U071 COVID-19: Secondary | ICD-10-CM | POA: Diagnosis not present

## 2021-04-29 DIAGNOSIS — H04129 Dry eye syndrome of unspecified lacrimal gland: Secondary | ICD-10-CM | POA: Diagnosis not present

## 2021-04-29 DIAGNOSIS — I110 Hypertensive heart disease with heart failure: Secondary | ICD-10-CM | POA: Diagnosis not present

## 2021-04-29 DIAGNOSIS — Z853 Personal history of malignant neoplasm of breast: Secondary | ICD-10-CM

## 2021-04-29 DIAGNOSIS — Z9181 History of falling: Secondary | ICD-10-CM

## 2021-04-29 DIAGNOSIS — M6259 Muscle wasting and atrophy, not elsewhere classified, multiple sites: Secondary | ICD-10-CM | POA: Diagnosis not present

## 2021-04-29 DIAGNOSIS — K5900 Constipation, unspecified: Secondary | ICD-10-CM

## 2021-04-29 DIAGNOSIS — I480 Paroxysmal atrial fibrillation: Secondary | ICD-10-CM | POA: Diagnosis not present

## 2021-04-29 DIAGNOSIS — I509 Heart failure, unspecified: Secondary | ICD-10-CM | POA: Diagnosis not present

## 2021-04-29 DIAGNOSIS — E876 Hypokalemia: Secondary | ICD-10-CM

## 2021-04-29 DIAGNOSIS — Z87440 Personal history of urinary (tract) infections: Secondary | ICD-10-CM

## 2021-04-29 DIAGNOSIS — R42 Dizziness and giddiness: Secondary | ICD-10-CM | POA: Diagnosis not present

## 2021-04-29 DIAGNOSIS — K589 Irritable bowel syndrome without diarrhea: Secondary | ICD-10-CM

## 2021-04-29 DIAGNOSIS — S32020D Wedge compression fracture of second lumbar vertebra, subsequent encounter for fracture with routine healing: Secondary | ICD-10-CM | POA: Diagnosis not present

## 2021-05-02 DIAGNOSIS — U071 COVID-19: Secondary | ICD-10-CM | POA: Diagnosis not present

## 2021-05-02 DIAGNOSIS — I509 Heart failure, unspecified: Secondary | ICD-10-CM | POA: Diagnosis not present

## 2021-05-02 DIAGNOSIS — I110 Hypertensive heart disease with heart failure: Secondary | ICD-10-CM | POA: Diagnosis not present

## 2021-05-02 DIAGNOSIS — S32020D Wedge compression fracture of second lumbar vertebra, subsequent encounter for fracture with routine healing: Secondary | ICD-10-CM | POA: Diagnosis not present

## 2021-05-02 DIAGNOSIS — E1142 Type 2 diabetes mellitus with diabetic polyneuropathy: Secondary | ICD-10-CM | POA: Diagnosis not present

## 2021-05-02 DIAGNOSIS — M62830 Muscle spasm of back: Secondary | ICD-10-CM | POA: Diagnosis not present

## 2021-05-05 DIAGNOSIS — E1142 Type 2 diabetes mellitus with diabetic polyneuropathy: Secondary | ICD-10-CM | POA: Diagnosis not present

## 2021-05-05 DIAGNOSIS — M62830 Muscle spasm of back: Secondary | ICD-10-CM | POA: Diagnosis not present

## 2021-05-05 DIAGNOSIS — I110 Hypertensive heart disease with heart failure: Secondary | ICD-10-CM | POA: Diagnosis not present

## 2021-05-05 DIAGNOSIS — I509 Heart failure, unspecified: Secondary | ICD-10-CM | POA: Diagnosis not present

## 2021-05-05 DIAGNOSIS — U071 COVID-19: Secondary | ICD-10-CM | POA: Diagnosis not present

## 2021-05-05 DIAGNOSIS — S32020D Wedge compression fracture of second lumbar vertebra, subsequent encounter for fracture with routine healing: Secondary | ICD-10-CM | POA: Diagnosis not present

## 2021-05-12 DIAGNOSIS — Z7901 Long term (current) use of anticoagulants: Secondary | ICD-10-CM | POA: Diagnosis not present

## 2021-05-13 DIAGNOSIS — S32020D Wedge compression fracture of second lumbar vertebra, subsequent encounter for fracture with routine healing: Secondary | ICD-10-CM | POA: Diagnosis not present

## 2021-05-13 DIAGNOSIS — I110 Hypertensive heart disease with heart failure: Secondary | ICD-10-CM | POA: Diagnosis not present

## 2021-05-13 DIAGNOSIS — E1142 Type 2 diabetes mellitus with diabetic polyneuropathy: Secondary | ICD-10-CM | POA: Diagnosis not present

## 2021-05-13 DIAGNOSIS — I509 Heart failure, unspecified: Secondary | ICD-10-CM | POA: Diagnosis not present

## 2021-05-13 DIAGNOSIS — U071 COVID-19: Secondary | ICD-10-CM | POA: Diagnosis not present

## 2021-05-13 DIAGNOSIS — M62830 Muscle spasm of back: Secondary | ICD-10-CM | POA: Diagnosis not present

## 2021-05-17 ENCOUNTER — Other Ambulatory Visit: Payer: Self-pay | Admitting: Internal Medicine

## 2021-05-17 DIAGNOSIS — M6259 Muscle wasting and atrophy, not elsewhere classified, multiple sites: Secondary | ICD-10-CM | POA: Diagnosis not present

## 2021-05-17 DIAGNOSIS — R42 Dizziness and giddiness: Secondary | ICD-10-CM | POA: Diagnosis not present

## 2021-05-17 DIAGNOSIS — M62830 Muscle spasm of back: Secondary | ICD-10-CM | POA: Diagnosis not present

## 2021-05-17 DIAGNOSIS — E876 Hypokalemia: Secondary | ICD-10-CM | POA: Diagnosis not present

## 2021-05-17 DIAGNOSIS — Z9181 History of falling: Secondary | ICD-10-CM | POA: Diagnosis not present

## 2021-05-17 DIAGNOSIS — H04129 Dry eye syndrome of unspecified lacrimal gland: Secondary | ICD-10-CM | POA: Diagnosis not present

## 2021-05-17 DIAGNOSIS — M810 Age-related osteoporosis without current pathological fracture: Secondary | ICD-10-CM | POA: Diagnosis not present

## 2021-05-17 DIAGNOSIS — I480 Paroxysmal atrial fibrillation: Secondary | ICD-10-CM | POA: Diagnosis not present

## 2021-05-17 DIAGNOSIS — E1142 Type 2 diabetes mellitus with diabetic polyneuropathy: Secondary | ICD-10-CM | POA: Diagnosis not present

## 2021-05-17 DIAGNOSIS — I110 Hypertensive heart disease with heart failure: Secondary | ICD-10-CM | POA: Diagnosis not present

## 2021-05-17 DIAGNOSIS — K5900 Constipation, unspecified: Secondary | ICD-10-CM | POA: Diagnosis not present

## 2021-05-17 DIAGNOSIS — Z950 Presence of cardiac pacemaker: Secondary | ICD-10-CM | POA: Diagnosis not present

## 2021-05-17 DIAGNOSIS — E43 Unspecified severe protein-calorie malnutrition: Secondary | ICD-10-CM | POA: Diagnosis not present

## 2021-05-17 DIAGNOSIS — Z853 Personal history of malignant neoplasm of breast: Secondary | ICD-10-CM | POA: Diagnosis not present

## 2021-05-17 DIAGNOSIS — F411 Generalized anxiety disorder: Secondary | ICD-10-CM | POA: Diagnosis not present

## 2021-05-17 DIAGNOSIS — S32020D Wedge compression fracture of second lumbar vertebra, subsequent encounter for fracture with routine healing: Secondary | ICD-10-CM | POA: Diagnosis not present

## 2021-05-17 DIAGNOSIS — F331 Major depressive disorder, recurrent, moderate: Secondary | ICD-10-CM | POA: Diagnosis not present

## 2021-05-17 DIAGNOSIS — E782 Mixed hyperlipidemia: Secondary | ICD-10-CM | POA: Diagnosis not present

## 2021-05-17 DIAGNOSIS — I509 Heart failure, unspecified: Secondary | ICD-10-CM | POA: Diagnosis not present

## 2021-05-17 DIAGNOSIS — K589 Irritable bowel syndrome without diarrhea: Secondary | ICD-10-CM | POA: Diagnosis not present

## 2021-05-17 DIAGNOSIS — U071 COVID-19: Secondary | ICD-10-CM | POA: Diagnosis not present

## 2021-05-17 DIAGNOSIS — Z87440 Personal history of urinary (tract) infections: Secondary | ICD-10-CM | POA: Diagnosis not present

## 2021-05-17 DIAGNOSIS — I495 Sick sinus syndrome: Secondary | ICD-10-CM | POA: Diagnosis not present

## 2021-05-17 DIAGNOSIS — K219 Gastro-esophageal reflux disease without esophagitis: Secondary | ICD-10-CM | POA: Diagnosis not present

## 2021-05-17 DIAGNOSIS — J309 Allergic rhinitis, unspecified: Secondary | ICD-10-CM | POA: Diagnosis not present

## 2021-05-22 DIAGNOSIS — Z20822 Contact with and (suspected) exposure to covid-19: Secondary | ICD-10-CM | POA: Diagnosis not present

## 2021-05-26 ENCOUNTER — Encounter: Payer: Self-pay | Admitting: Adult Health

## 2021-05-26 ENCOUNTER — Other Ambulatory Visit: Payer: Self-pay

## 2021-05-26 ENCOUNTER — Other Ambulatory Visit: Payer: Self-pay | Admitting: Adult Health

## 2021-05-26 ENCOUNTER — Ambulatory Visit (INDEPENDENT_AMBULATORY_CARE_PROVIDER_SITE_OTHER): Payer: Medicare Other

## 2021-05-26 ENCOUNTER — Ambulatory Visit (INDEPENDENT_AMBULATORY_CARE_PROVIDER_SITE_OTHER): Payer: Medicare Other | Admitting: Adult Health

## 2021-05-26 VITALS — BP 138/64 | HR 63 | Temp 96.5°F | Ht 65.98 in | Wt 126.2 lb

## 2021-05-26 DIAGNOSIS — R0602 Shortness of breath: Secondary | ICD-10-CM

## 2021-05-26 DIAGNOSIS — R0989 Other specified symptoms and signs involving the circulatory and respiratory systems: Secondary | ICD-10-CM | POA: Diagnosis not present

## 2021-05-26 DIAGNOSIS — J0100 Acute maxillary sinusitis, unspecified: Secondary | ICD-10-CM

## 2021-05-26 DIAGNOSIS — Z8616 Personal history of COVID-19: Secondary | ICD-10-CM

## 2021-05-26 DIAGNOSIS — I517 Cardiomegaly: Secondary | ICD-10-CM | POA: Diagnosis not present

## 2021-05-26 DIAGNOSIS — E119 Type 2 diabetes mellitus without complications: Secondary | ICD-10-CM

## 2021-05-26 DIAGNOSIS — E876 Hypokalemia: Secondary | ICD-10-CM

## 2021-05-26 DIAGNOSIS — R5383 Other fatigue: Secondary | ICD-10-CM

## 2021-05-26 DIAGNOSIS — E559 Vitamin D deficiency, unspecified: Secondary | ICD-10-CM | POA: Diagnosis not present

## 2021-05-26 DIAGNOSIS — J90 Pleural effusion, not elsewhere classified: Secondary | ICD-10-CM | POA: Diagnosis not present

## 2021-05-26 DIAGNOSIS — R9389 Abnormal findings on diagnostic imaging of other specified body structures: Secondary | ICD-10-CM

## 2021-05-26 LAB — COMPREHENSIVE METABOLIC PANEL
ALT: 14 U/L (ref 0–35)
AST: 17 U/L (ref 0–37)
Albumin: 3.7 g/dL (ref 3.5–5.2)
Alkaline Phosphatase: 85 U/L (ref 39–117)
BUN: 12 mg/dL (ref 6–23)
CO2: 30 mEq/L (ref 19–32)
Calcium: 9.6 mg/dL (ref 8.4–10.5)
Chloride: 97 mEq/L (ref 96–112)
Creatinine, Ser: 0.49 mg/dL (ref 0.40–1.20)
GFR: 81.64 mL/min (ref >60.00)
Glucose, Bld: 152 mg/dL — ABNORMAL HIGH (ref 70–99)
Potassium: 4 mEq/L (ref 3.5–5.1)
Sodium: 134 mEq/L — ABNORMAL LOW (ref 135–145)
Total Bilirubin: 0.8 mg/dL (ref 0.2–1.2)
Total Protein: 6.2 g/dL (ref 6.0–8.3)

## 2021-05-26 LAB — CBC WITH DIFFERENTIAL/PLATELET
Basophils Absolute: 0 10*3/uL (ref 0.0–0.1)
Basophils Relative: 0.7 % (ref 0.0–3.0)
Eosinophils Absolute: 0.2 10*3/uL (ref 0.0–0.7)
Eosinophils Relative: 4.8 % (ref 0.0–5.0)
HCT: 38.5 % (ref 36.0–46.0)
Hemoglobin: 12.9 g/dL (ref 12.0–15.0)
Lymphocytes Relative: 15.3 % (ref 12.0–46.0)
Lymphs Abs: 0.6 10*3/uL — ABNORMAL LOW (ref 0.7–4.0)
MCHC: 33.5 g/dL (ref 30.0–36.0)
MCV: 97.4 fl (ref 78.0–100.0)
Monocytes Absolute: 0.4 10*3/uL (ref 0.1–1.0)
Monocytes Relative: 11.4 % (ref 3.0–12.0)
Neutro Abs: 2.6 10*3/uL (ref 1.4–7.7)
Neutrophils Relative %: 67.8 % (ref 43.0–77.0)
Platelets: 201 10*3/uL (ref 150.0–400.0)
RBC: 3.96 Mil/uL (ref 3.87–5.11)
RDW: 15 % (ref 11.5–15.5)
WBC: 3.9 10*3/uL — ABNORMAL LOW (ref 4.0–10.5)

## 2021-05-26 LAB — VITAMIN D 25 HYDROXY (VIT D DEFICIENCY, FRACTURES): VITD: 26.74 ng/mL — ABNORMAL LOW (ref 30.00–100.00)

## 2021-05-26 LAB — HEMOGLOBIN A1C: Hgb A1c MFr Bld: 7.5 % — ABNORMAL HIGH (ref 4.6–6.5)

## 2021-05-26 LAB — TSH: TSH: 0.66 u[IU]/mL (ref 0.35–5.50)

## 2021-05-26 NOTE — Telephone Encounter (Signed)
Patient has been referred pulmonary - she should hear in one week if not notify us.

## 2021-05-26 NOTE — Patient Instructions (Signed)
Shortness of Breath, Adult °Shortness of breath means you have trouble breathing. Shortness of breath could be a sign of a medical problem. °Follow these instructions at home: °Pollution °Do not smoke or use any products that contain nicotine or tobacco. If you need help quitting, ask your doctor. °Avoid things that can make it harder to breathe, such as: °Smoke of all kinds. This includes smoke from campfires or forest fires. Do not smoke or allow others to smoke in your home. °Mold. °Dust. °Air pollution. °Chemical smells. °Things that can give you an allergic reaction (allergens) if you have allergies. °Keep your living space clean. Use products that help remove mold and dust. °General instructions °Watch for any changes in your symptoms. °Take over-the-counter and prescription medicines only as told by your doctor. This includes oxygen therapy and inhaled medicines. °Rest as needed. °Return to your normal activities when your doctor says that it is safe. °Keep all follow-up visits. °Contact a doctor if: °Your condition does not get better as soon as expected. °You have a hard time doing your normal activities, even after you rest. °You have new symptoms. °You cannot walk up stairs. °You cannot exercise the way you normally do. °Get help right away if: °Your shortness of breath gets worse. °You have trouble breathing when you are resting. °You feel light-headed or you faint. °You have a cough that is not helped by medicines. °You cough up blood. °You have pain with breathing. °You have pain in your chest, arms, shoulders, or belly (abdomen). °You have a fever. °These symptoms may be an emergency. Get help right away. Call 911. °Do not wait to see if the symptoms will go away. °Do not drive yourself to the hospital. °Summary °Shortness of breath is when you have trouble breathing enough air. It can be a sign of a medical problem. °Avoid things that make it hard for you to breathe, such as smoking, pollution, mold,  and dust. °Watch for any changes in your symptoms. Contact your doctor if you do not get better or you get worse. °This information is not intended to replace advice given to you by your health care provider. Make sure you discuss any questions you have with your health care provider. °Document Revised: 01/18/2021 Document Reviewed: 01/18/2021 °Elsevier Patient Education © 2022 [employer] ° °

## 2021-05-26 NOTE — Progress Notes (Signed)
Labs still pending.  Chest x ray shows mild enlargement of heart likely due to pulmonary venous congestion. Trace bilateral effusions seen again were on previous x tay 02/22/21.  Aortic atherosclerosis is seen.  Use inhaler as discussed. Keep cardiology appointment in one week. Will go ahead a refer to pulmonary given her increased weakness and shortness of breath that has been persistent. Post covid. If in agreement with referral please let me know.

## 2021-05-26 NOTE — Progress Notes (Signed)
Orders Placed This Encounter  Procedures   Ambulatory referral to Pulmonology    Referral Priority:   Urgent    Referral Type:   Consultation    Referral Reason:   Specialty Services Required    Requested Specialty:   Pulmonary Disease    Number of Visits Requested:   1

## 2021-05-26 NOTE — Progress Notes (Signed)
Acute Office Visit  Subjective:    Patient ID: Leah Holmes, female    DOB: 07/29/1926, 87 y.o.   MRN: [id number]  Chief Complaint  Patient presents with   Fatigue   Shortness of Breath    HPI Patient is in today for follow up she last saw Dr. Derrel Nip  on 04/24/2021 She has been having some shortness of breath , weakness and fatigue over the past week that she has had chronically she reports but seems to be mildly worsening. She has had chest congestion.  Denies any fever or chills.  Denies any productive cough. She has taken a few nitroglycerin in the past few days.  She sees Dr. Drema Dallas next week for atrial fibrillation, seen last week for INR, she has a pacemaker reading.  She has been waking up sweating at night,chest congestion.   Covid October 2022.   Patient  denies any fever, body aches,chills, rash, chest pain, nausea, vomiting, or diarrhea.    Denies dizziness, lightheadedness, pre syncopal or syncopal episodes.    Past Medical History:  Diagnosis Date   Atrial fibrillation ([place]) 2012   Breast cancer ([place]) 04/2018   IDC of left breast    CHF (congestive heart failure) ([place])    Colon polyp 2003   Coronary artery disease    COVID-19 virus infection 12/25/2019   Diabetes mellitus without complication ([facility])    Dyspnea    Dysrhythmia    atrial fib   Environmental and seasonal allergies    GERD (gastroesophageal reflux disease)    Hyperlipidemia    Hypertension    Irritable bowel syndrome    Multiple rib fractures 03/15/2018   Personal history of radiation therapy    Polyp of colon    PONV (postoperative nausea and vomiting)    with ether, not recently "woke up in surgery once"   Presence of permanent cardiac pacemaker    Tremor    Tremors of nervous system    Ulcer     Past Surgical History:  Procedure Laterality Date   ABDOMINAL ADHESION SURGERY     ABDOMINAL HYSTERECTOMY     APPENDECTOMY     BREAST BIOPSY Right late 80s/early 90s    benign   BREAST BIOPSY Left 03/31/2018   [place] bx done with Dr. Bary Castilla, invasive ductal carcinoma   BREAST LUMPECTOMY Left 04/22/2018   Procedure: BREAST LUMPECTOMY;  Surgeon: Robert Bellow, MD;  Location: [facility] ORS;  Service: General;  Laterality: Left;   BREAST LUMPECTOMY Left 04/22/2018   [place], negative LN, clear margins   BREAST LUMPECTOMY Left 2020   positive, done in office   BREAST SURGERY Right    biopsy    CESAREAN SECTION     x 2   CHOLECYSTECTOMY  03/2012   Dr Burt Knack   COLONOSCOPY  2003   Dr Theodosia Paling in [place]   fibroid tumor     [place] N/A 01/28/2016   Procedure: INSERTION PACEMAKER;  Surgeon: Isaias Cowman, MD;  Location: [facility] ORS;  Service: Cardiovascular;  Laterality: N/A;   SENTINEL NODE BIOPSY Left 04/22/2018   Procedure: SENTINEL NODE BIOPSY;  Surgeon: Robert Bellow, MD;  Location: [facility] ORS;  Service: General;  Laterality: Left;   UPPER GI ENDOSCOPY  2003   Dr Theodosia Paling in [place]    Family History  Problem Relation Age of Onset   Breast cancer Sister    Cancer Sister 49       breast   Breast cancer Maternal Aunt 35  Breast cancer Cousin    Breast cancer Other 46    Social History   Socioeconomic History   Marital status: Widowed    Spouse name: Not on file   Number of children: Not on file   Years of education: Not on file   Highest education level: Not on file  Occupational History   Not on file  Tobacco Use   Smoking status: Never   Smokeless tobacco: Former    Types: Snuff   Tobacco comments:    occasionally  Vaping Use   Vaping Use: Never used  Substance and Sexual Activity   Alcohol use: No   Drug use: No   Sexual activity: Never  Other Topics Concern   Not on file  Social History Narrative   Not on file   Social Determinants of Health   Financial Resource Strain: Not on file  Food Insecurity: Not on file  Transportation Needs: Not on file  Physical Activity: Not on file  Stress: Not on file  Social  Connections: Not on file  Intimate Partner Violence: Not on file    Outpatient Medications Prior to Visit  Medication Sig Dispense Refill   acetaminophen (TYLENOL) 325 MG tablet Take 2 tablets (650 mg total) by mouth every 6 (six) hours as needed.     albuterol (PROAIR HFA) 108 (90 Base) MCG/ACT inhaler Inhale 1-2 puffs into the lungs every 4 (four) hours as needed for wheezing or shortness of breath. 1 Inhaler 5   amLODipine (NORVASC) 10 MG tablet Take 1 tablet (10 mg total) by mouth daily. 30 tablet 0   atenolol (TENORMIN) 50 MG tablet Take 1 tablet by mouth twice daily 180 tablet 0   calcitonin, salmon, (MIACALCIN/FORTICAL) 200 UNIT/ACT nasal spray Place 1 spray into alternate nostrils daily. 3.7 mL 0   Cholecalciferol (VITAMIN D3) 1000 UNITS CAPS Take 1,000 Units by mouth every morning.      docusate sodium (COLACE) 100 MG capsule Take 2 capsules (200 mg total) by mouth 2 (two) times daily. 60 capsule 0   ergocalciferol (DRISDOL) 1.25 MG (50000 UT) capsule Take 1 capsule (50,000 Units total) by mouth once a week. 12 capsule 0   glucose blood (ONE TOUCH ULTRA TEST) test strip USE 1 STRIP TO CHECK GLUCOSE TWICE DAILY  Dx code: E11.9 200 each 3   glucose blood (ONE TOUCH ULTRA TEST) test strip USE ONE STRIP TO CHECK GLUCOSE ONCE  DAILY 100 each 3   hydrochlorothiazide (HYDRODIURIL) 12.5 MG tablet Take 1 tablet (12.5 mg total) by mouth daily. 30 tablet 0   ipratropium (ATROVENT) 0.03 % nasal spray Place 2 sprays into both nostrils every 12 (twelve) hours. 30 mL 12   isosorbide mononitrate (IMDUR) 60 MG 24 hr tablet Take 60 mg by mouth 2 (two) times daily.     Lancets (ONETOUCH DELICA PLUS QPRFFM38G) MISC Inject 2 Devices into the skin daily. DX Code E11.9. 200 each 2   lisinopril (ZESTRIL) 20 MG tablet Take 2 tablets by mouth twice daily 180 tablet 0   meclizine (ANTIVERT) 25 MG tablet Take 1 tablet by mouth three times daily as needed 90 tablet 0   mometasone (ELOCON) 0.1 % cream Apply once ot  twice daily as needed for itching 15 g 1   nitroGLYCERIN (NITROSTAT) 0.4 MG SL tablet Place 0.4 mg under the tongue every 5 (five) minutes as needed for chest pain.     omeprazole (PRILOSEC) 20 MG capsule Take 1 capsule by mouth once daily 90  capsule 0   ONETOUCH ULTRA test strip USE 1 STRIP TO CHECK GLUCOSE TWICE DAILY 100 each 0   Polyethyl Glycol-Propyl Glycol 0.4-0.3 % SOLN Place 1 drop into both eyes 3 (three) times daily as needed (for dry/irritated eyes.).     polyethylene glycol (MIRALAX / GLYCOLAX) 17 g packet Take 17 g by mouth daily as needed.     psyllium (METAMUCIL) 58.6 % packet Take 1 packet by mouth daily as needed (for regularity).      traMADol (ULTRAM) 50 MG tablet Take 1 tablet (50 mg total) by mouth every 12 (twelve) hours as needed for severe pain. 8 tablet 0   triamcinolone cream (KENALOG) 0.1 % APPLY 1 APPLICATION TOPICALLY TWICE DAILY 30 g 0   warfarin (COUMADIN) 2 MG tablet Take 2 mg by mouth 2 (two) times a week. Take 2mg  at bedtime on Wednesday & Sunday.     warfarin (COUMADIN) 4 MG tablet Take 4 mg by mouth daily at 6 PM. Does not take Wednesday or Sunday.     spironolactone (ALDACTONE) 25 MG tablet Take 1 tablet (25 mg total) by mouth daily. (Patient not taking: Reported on 05/26/2021) 30 tablet 0   No facility-administered medications prior to visit.    Allergies  Allergen Reactions   Codeine Anaphylaxis   Penicillins Anaphylaxis    Has patient had a PCN reaction causing immediate rash, facial/tongue/throat swelling, SOB or lightheadedness with hypotension: Yes Has patient had a PCN reaction causing severe rash involving mucus membranes or skin necrosis: No Has patient had a PCN reaction that required hospitalization Yes Has patient had a PCN reaction occurring within the last 10 years: No If all of the above answers are "NO", then may proceed with Cephalosporin use.   Amlodipine Other (See Comments)    Diffuse itching    Atorvastatin     Other reaction(s):  Unknown   Clonidine Derivatives Other (See Comments)    Reaction:  Unknown    Gabapentin Nausea Only   Losartan Other (See Comments)    Other reaction(s): Other (See Comments) Reaction:  Muscle aches  Reaction:  Muscle aches    Morphine     Other reaction(s): Unknown   Morphine And Related Other (See Comments)    Reaction:  Unknown    Reglan [Metoclopramide] Other (See Comments)    Reaction:  Tremors    Hydralazine Anxiety    Review of Systems  Constitutional: Negative.   HENT: Negative.    Respiratory:  Positive for cough and shortness of breath. Negative for apnea, choking, chest tightness, wheezing and stridor.   Cardiovascular: Negative.  Negative for chest pain, palpitations and leg swelling.  Gastrointestinal: Negative.   Genitourinary: Negative.   Musculoskeletal: Negative.   Skin: Negative.       Objective:    Physical Exam Constitutional:      General: She is not in acute distress.    Appearance: She is not ill-appearing, toxic-appearing or diaphoretic.     Comments: Frail elderly female.   HENT:     Head: Normocephalic and atraumatic.     Jaw: There is normal jaw occlusion.     Right Ear: External ear normal. A middle ear effusion is present. Tympanic membrane is not perforated or erythematous.     Left Ear: External ear normal. A middle ear effusion is present. Tympanic membrane is not perforated or erythematous.     Nose: Congestion and rhinorrhea present.     Right Sinus: Maxillary sinus tenderness present. No frontal  sinus tenderness.     Left Sinus: Maxillary sinus tenderness present. No frontal sinus tenderness.     Mouth/Throat:     Mouth: Mucous membranes are moist.     Pharynx: Oropharynx is clear. Uvula midline. No oropharyngeal exudate or posterior oropharyngeal erythema.     Tonsils: No tonsillar exudate or tonsillar abscesses.  Eyes:     Extraocular Movements: Extraocular movements intact.     Pupils: Pupils are equal, round, and reactive to  light.  Cardiovascular:     Rate and Rhythm: Normal rate and regular rhythm. No extrasystoles are present.    Heart sounds: No murmur heard. Pulmonary:     Breath sounds: Examination of the right-upper field reveals wheezing. Examination of the left-upper field reveals wheezing. Wheezing (mild expiratory wheezing.) present. No decreased breath sounds, rhonchi or rales.  Chest:     Chest wall: No mass, deformity, tenderness, crepitus or edema. There is no dullness to percussion.  Musculoskeletal:     Cervical back: Normal range of motion and neck supple.  Neurological:     Mental Status: She is alert.    BP 138/64   Pulse 63   Temp (!) 96.5 F (35.8 C)   Ht 5' 5.98" (1.676 m)   Wt 126 lb 3.2 oz (57.2 kg)   SpO2 97%   BMI 20.38 kg/m  Wt Readings from Last 3 Encounters:  05/26/21 126 lb 3.2 oz (57.2 kg)  04/24/21 121 lb (54.9 kg)  03/12/21 125 lb (56.7 kg)    Health Maintenance Due  Topic Date Due   COVID-19 Vaccine (1) Never done   Zoster Vaccines- Shingrix (1 of 2) Never done   FOOT EXAM  04/14/2018   OPHTHALMOLOGY EXAM  12/26/2019    There are no preventive care reminders to display for this patient.   Lab Results  Component Value Date   TSH 0.66 05/26/2021   Lab Results  Component Value Date   WBC 3.9 (L) 05/26/2021   HGB 12.9 05/26/2021   HCT 38.5 05/26/2021   MCV 97.4 05/26/2021   PLT 201.0 05/26/2021   Lab Results  Component Value Date   NA 134 (L) 05/26/2021   K 4.0 05/26/2021   CO2 30 05/26/2021   GLUCOSE 152 (H) 05/26/2021   BUN 12 05/26/2021   CREATININE 0.49 05/26/2021   BILITOT 0.8 05/26/2021   ALKPHOS 85 05/26/2021   AST 17 05/26/2021   ALT 14 05/26/2021   PROT 6.2 05/26/2021   ALBUMIN 3.7 05/26/2021   CALCIUM 9.6 05/26/2021   ANIONGAP 6 03/14/2021   GFR 81.64 05/26/2021   Lab Results  Component Value Date   CHOL 174 09/13/2020   Lab Results  Component Value Date   HDL 60.10 09/13/2020   Lab Results  Component Value Date    LDLCALC 101 (H) 09/13/2020   Lab Results  Component Value Date   TRIG 63.0 09/13/2020   Lab Results  Component Value Date   CHOLHDL 3 09/13/2020   Lab Results  Component Value Date   HGBA1C 7.5 (H) 05/26/2021       Assessment & Plan:   Problem List Items Addressed This Visit       Other   Hypokalemia   History of COVID-19   Relevant Orders   DG Chest 2 View (Completed)   Other Visit Diagnoses     Shortness of breath    -  Primary   Relevant Orders   COVID-19, Flu A+B and RSV   DG Chest  2 View (Completed)   Chest congestion       Relevant Orders   DG Chest 2 View (Completed)   Other fatigue       Relevant Orders   CBC with Differential/Platelet (Completed)   Comprehensive metabolic panel (Completed)   TSH (Completed)   DG Chest 2 View (Completed)   Vitamin D deficiency       Relevant Orders   VITAMIN D 25 Hydroxy (Vit-D Deficiency, Fractures) (Completed)   Diabetes mellitus without complication ([place])       Relevant Orders   Hemoglobin A1c (Completed)      The primary encounter diagnosis was Shortness of breath. Diagnoses of History of COVID-19, Chest congestion, Other fatigue, Vitamin D deficiency, Hypokalemia, and Diabetes mellitus without complication ([place]) were also pertinent to this visit.   Meds ordered this encounter  Medications   doxycycline (VIBRA-TABS) 100 MG tablet    Sig: Take 1 tablet (100 mg total) by mouth 2 (two) times daily.    Dispense:  10 tablet    Refill:  0   Monitor for any bleeding can affect INR, recommend INR recheck in 1 week.  Keep cardiology appointment next week. She has not been using her albuterol, advised to try once daily for 2 weeks.  Advised patient call the office or your primary care doctor for an appointment if no improvement within 72 hours or if any symptoms change or worsen at any time  Advised ER or urgent Care if after hours or on weekend. Call 911 for emergency symptoms at any time.Patinet verbalized  understanding of all instructions given/reviewed and treatment plan and has no further questions or concerns at this time.   Return in about 1 week (around 06/02/2021), or if symptoms worsen or fail to improve, for at any time for any worsening symptoms, Go to Emergency room/ urgent care if worse.   Marcille Buffy, FNP

## 2021-05-27 ENCOUNTER — Telehealth: Payer: Self-pay | Admitting: Internal Medicine

## 2021-05-27 ENCOUNTER — Encounter: Payer: Self-pay | Admitting: Adult Health

## 2021-05-27 ENCOUNTER — Other Ambulatory Visit: Payer: Self-pay | Admitting: Adult Health

## 2021-05-27 DIAGNOSIS — E119 Type 2 diabetes mellitus without complications: Secondary | ICD-10-CM | POA: Insufficient documentation

## 2021-05-27 DIAGNOSIS — E559 Vitamin D deficiency, unspecified: Secondary | ICD-10-CM | POA: Insufficient documentation

## 2021-05-27 DIAGNOSIS — J0100 Acute maxillary sinusitis, unspecified: Secondary | ICD-10-CM | POA: Insufficient documentation

## 2021-05-27 DIAGNOSIS — R0989 Other specified symptoms and signs involving the circulatory and respiratory systems: Secondary | ICD-10-CM | POA: Insufficient documentation

## 2021-05-27 DIAGNOSIS — R0602 Shortness of breath: Secondary | ICD-10-CM | POA: Insufficient documentation

## 2021-05-27 MED ORDER — DOXYCYCLINE HYCLATE 100 MG PO TABS: 100.0000 mg | ORAL_TABLET | Freq: Two times a day (BID) | ORAL | 0 refills | Status: DC

## 2021-05-27 NOTE — Telephone Encounter (Signed)
Spoke with Pharmacist Mickel Baas who just wanted to make sure she would be having INR checked more frequently while on Warfarin and Doxycyline as known an  antibiotic can raise INR.  I had already advised in previous note to have INR checked next week at her cardiology appointment they already had scheduled. 12/19 she should have recheck.  Monitor for any bleeding.  Please advise patients daughter as I am unsure if she has been informed yet.

## 2021-05-27 NOTE — Telephone Encounter (Signed)
Noted  

## 2021-05-27 NOTE — Telephone Encounter (Signed)
Placed call to pt. Pts daughter states pt doesn't have an infection and probably wont take the doxycycline. Informed daughter to call office back to let us know if she decides to take the medication cause she'll need her INR checked this week.

## 2021-05-27 NOTE — Telephone Encounter (Signed)
Pharmacy called and stated there is a medical interaction between pts doxycycline (VIBRA-TABS) 100 MG tablet and warfarin (COUMADIN) 2 MG tablet

## 2021-05-27 NOTE — Progress Notes (Signed)
CBC stable.CMP stable glucose not fasting, and sodium stable.  Vitamin d improving, continue dose currently taking.  TSH for thyroid  stable.  Hemoglobin A1C has increased 7.5, monitor glucose in diet and continue to monitor glucose, schedule follow up with PCP to discuss A1C, Recheck A1c and vitamin d in 3 months please add labs and schedule.

## 2021-05-27 NOTE — Telephone Encounter (Signed)
Please have patient call cardiology to get INR checked on 12/15 or 12/16 since she will be on doxycycline with her warfarin.

## 2021-05-28 ENCOUNTER — Other Ambulatory Visit: Payer: Medicare Other

## 2021-05-28 LAB — COVID-19, FLU A+B AND RSV
Influenza A, NAA: NOT DETECTED
Influenza B, NAA: NOT DETECTED
RSV, NAA: NOT DETECTED
SARS-CoV-2, NAA: NOT DETECTED

## 2021-05-28 NOTE — Progress Notes (Signed)
Covid, flu and rsv all negative. I suspect a sinusitis/ chest infection given her worsening fatigue and cough.  Prior recommendations already sent.

## 2021-05-28 NOTE — Progress Notes (Signed)
Spoke with pts daughter. Pt declined taking doxycycline but will call office if she decides to take. Pt and daughter aware of risk with INR and need to get checked with taking antibiotic .

## 2021-05-29 ENCOUNTER — Ambulatory Visit (INDEPENDENT_AMBULATORY_CARE_PROVIDER_SITE_OTHER): Payer: Medicare Other | Admitting: Pulmonary Disease

## 2021-05-29 ENCOUNTER — Encounter: Payer: Self-pay | Admitting: Pulmonary Disease

## 2021-05-29 ENCOUNTER — Other Ambulatory Visit: Payer: Self-pay

## 2021-05-29 VITALS — BP 120/52 | HR 67 | Temp 97.5°F | Ht 66.0 in | Wt 126.0 lb

## 2021-05-29 DIAGNOSIS — J90 Pleural effusion, not elsewhere classified: Secondary | ICD-10-CM | POA: Diagnosis not present

## 2021-05-29 DIAGNOSIS — R0602 Shortness of breath: Secondary | ICD-10-CM

## 2021-05-29 DIAGNOSIS — I509 Heart failure, unspecified: Secondary | ICD-10-CM

## 2021-05-29 NOTE — Progress Notes (Signed)
Subjective:    Patient ID: Leah Holmes, female    DOB: 04-Oct-1926, 87 y.o.   MRN: [id number] Chief Complaint  Patient presents with   Consult    Abnormal CXR, SOB.   HPI Patient is a 87 year old lifelong never smoker (albeit snuff user in the past) presents for evaluation of an abnormal chest x-ray and shortness of breath.  She is kindly referred by Laverna Peace, [place].  Her primary care physician is Dr. Deborra Medina.  Patient has had some issues of longstanding with breathlessness due to excessive mucus.  She was previously evaluated here by Dr. Ashby Dawes in 2019.  She presents with her daughter today.  The patient had COVID-19 twice, last episode was October 2022 she required rehab/SNF after that briefly.  She now resides with her daughter.  Over the last 2 to 3 weeks she has noted increasing shortness of breath and generalized weakness.  She has not had any wheezing.  No cough or sputum production.  She notes that increasing activity does make her short of breath and resting alleviates her symptoms.  She has an albuterol inhaler but has not noted that this helps her in particular.  However it appears that she may have difficulties getting the medication into her lungs appropriately.  She had a chest x-ray obtained on 12 December that showed cardiomegaly and trace bilateral pleural effusions.  Mild interstitial prominence as noted previously may be residual from COVID-19 infection.  Findings are consistent with pulmonary edema.   Review of Systems A 10 point review of systems was performed and it is as noted above otherwise negative.  Past Medical History:  Diagnosis Date   Atrial fibrillation ([place]) 2012   Breast cancer ([place]) 04/2018   IDC of left breast    CHF (congestive heart failure) ([place])    Colon polyp 2003   Coronary artery disease    COVID-19 virus infection 12/25/2019   Diabetes mellitus without complication (H[facility])    Dyspnea    Dysrhythmia    atrial fib    Environmental and seasonal allergies    GERD (gastroesophageal reflux disease)    Hyperlipidemia    Hypertension    Irritable bowel syndrome    Multiple rib fractures 03/15/2018   Personal history of radiation therapy    Polyp of colon    PONV (postoperative nausea and vomiting)    with ether, not recently "woke up in surgery once"   Presence of permanent cardiac pacemaker    Tremor    Tremors of nervous system    Ulcer    Past Surgical History:  Procedure Laterality Date   ABDOMINAL ADHESION SURGERY     ABDOMINAL HYSTERECTOMY     APPENDECTOMY     BREAST BIOPSY Right late 80s/early 90s   benign   BREAST BIOPSY Left 03/31/2018   [place] bx done with Dr. Bary Castilla, invasive ductal carcinoma   BREAST LUMPECTOMY Left 04/22/2018   Procedure: BREAST LUMPECTOMY;  Surgeon: Robert Bellow, MD;  Location: [facility] ORS;  Service: General;  Laterality: Left;   BREAST LUMPECTOMY Left 04/22/2018   [place], negative LN, clear margins   BREAST LUMPECTOMY Left 2020   positive, done in office   BREAST SURGERY Right    biopsy    CESAREAN SECTION     x 2   CHOLECYSTECTOMY  03/2012   Dr Burt Knack   COLONOSCOPY  2003   Dr Theodosia Paling in [place]   fibroid tumor     [place] N/A 01/28/2016  Procedure: INSERTION PACEMAKER;  Surgeon: Isaias Cowman, MD;  Location: [facility] ORS;  Service: Cardiovascular;  Laterality: N/A;   SENTINEL NODE BIOPSY Left 04/22/2018   Procedure: SENTINEL NODE BIOPSY;  Surgeon: Robert Bellow, MD;  Location: [facility] ORS;  Service: General;  Laterality: Left;   UPPER GI ENDOSCOPY  2003   Dr Theodosia Paling in [place]   Family History  Problem Relation Age of Onset   Breast cancer Sister    Cancer Sister 22       breast   Breast cancer Maternal Aunt 68   Breast cancer Cousin    Breast cancer Other 78   Social History   Tobacco Use   Smoking status: Never   Smokeless tobacco: Former    Types: Snuff   Tobacco comments:    occasionally  Substance Use Topics   Alcohol  use: No   Allergies  Allergen Reactions   Codeine Anaphylaxis   Penicillins Anaphylaxis    Has patient had a PCN reaction causing immediate rash, facial/tongue/throat swelling, SOB or lightheadedness with hypotension: Yes Has patient had a PCN reaction causing severe rash involving mucus membranes or skin necrosis: No Has patient had a PCN reaction that required hospitalization Yes Has patient had a PCN reaction occurring within the last 10 years: No If all of the above answers are "NO", then may proceed with Cephalosporin use.   Amlodipine Other (See Comments)    Diffuse itching    Atorvastatin     Other reaction(s): Unknown   Clonidine Derivatives Other (See Comments)    Reaction:  Unknown    Gabapentin Nausea Only   Losartan Other (See Comments)    Other reaction(s): Other (See Comments) Reaction:  Muscle aches  Reaction:  Muscle aches    Morphine     Other reaction(s): Unknown   Morphine And Related Other (See Comments)    Reaction:  Unknown    Reglan [Metoclopramide] Other (See Comments)    Reaction:  Tremors    Hydralazine Anxiety   Current Meds  Medication Sig   acetaminophen (TYLENOL) 325 MG tablet Take 2 tablets (650 mg total) by mouth every 6 (six) hours as needed.   albuterol (PROAIR HFA) 108 (90 Base) MCG/ACT inhaler Inhale 1-2 puffs into the lungs every 4 (four) hours as needed for wheezing or shortness of breath.   amLODipine (NORVASC) 10 MG tablet Take 1 tablet (10 mg total) by mouth daily.   atenolol (TENORMIN) 50 MG tablet Take 1 tablet by mouth twice daily   calcitonin, salmon, (MIACALCIN/FORTICAL) 200 UNIT/ACT nasal spray Place 1 spray into alternate nostrils daily.   Cholecalciferol (VITAMIN D3) 1000 UNITS CAPS Take 1,000 Units by mouth every morning.    docusate sodium (COLACE) 100 MG capsule Take 2 capsules (200 mg total) by mouth 2 (two) times daily.   doxycycline (VIBRA-TABS) 100 MG tablet Take 1 tablet (100 mg total) by mouth 2 (two) times daily.    ergocalciferol (DRISDOL) 1.25 MG (50000 UT) capsule Take 1 capsule (50,000 Units total) by mouth once a week.   glucose blood (ONE TOUCH ULTRA TEST) test strip USE 1 STRIP TO CHECK GLUCOSE TWICE DAILY  Dx code: E11.9   glucose blood (ONE TOUCH ULTRA TEST) test strip USE ONE STRIP TO CHECK GLUCOSE ONCE  DAILY   hydrochlorothiazide (HYDRODIURIL) 12.5 MG tablet Take 1 tablet (12.5 mg total) by mouth daily.   ipratropium (ATROVENT) 0.03 % nasal spray Place 2 sprays into both nostrils every 12 (twelve) hours.   isosorbide mononitrate (  IMDUR) 60 MG 24 hr tablet Take 60 mg by mouth 2 (two) times daily.   Lancets (ONETOUCH DELICA PLUS SEGBTD17O) MISC Inject 2 Devices into the skin daily. DX Code E11.9.   lisinopril (ZESTRIL) 20 MG tablet Take 2 tablets by mouth twice daily   meclizine (ANTIVERT) 25 MG tablet Take 1 tablet by mouth three times daily as needed   mometasone (ELOCON) 0.1 % cream Apply once ot twice daily as needed for itching   nitroGLYCERIN (NITROSTAT) 0.4 MG SL tablet Place 0.4 mg under the tongue every 5 (five) minutes as needed for chest pain.   omeprazole (PRILOSEC) 20 MG capsule Take 1 capsule by mouth once daily   ONETOUCH ULTRA test strip USE 1 STRIP TO CHECK GLUCOSE TWICE DAILY   Polyethyl Glycol-Propyl Glycol 0.4-0.3 % SOLN Place 1 drop into both eyes 3 (three) times daily as needed (for dry/irritated eyes.).   polyethylene glycol (MIRALAX / GLYCOLAX) 17 g packet Take 17 g by mouth daily as needed.   psyllium (METAMUCIL) 58.6 % packet Take 1 packet by mouth daily as needed (for regularity).    spironolactone (ALDACTONE) 25 MG tablet Take 1 tablet (25 mg total) by mouth daily.   traMADol (ULTRAM) 50 MG tablet Take 1 tablet (50 mg total) by mouth every 12 (twelve) hours as needed for severe pain.   triamcinolone cream (KENALOG) 0.1 % APPLY 1 APPLICATION TOPICALLY TWICE DAILY   warfarin (COUMADIN) 2 MG tablet Take 2 mg by mouth 2 (two) times a week. Take 2mg  at bedtime on Wednesday &  Sunday.   warfarin (COUMADIN) 4 MG tablet Take 4 mg by mouth daily at 6 PM. Does not take Wednesday or Sunday.   Immunization History  Administered Date(s) Administered   Influenza Split 04/07/2012, 03/07/2018   Influenza,inj,Quad PF,6+ Mos 03/08/2013, 03/07/2014, 03/13/2015, 04/03/2019, 02/23/2020   Influenza-Unspecified 03/14/2021   Pneumococcal Conjugate-13 08/31/2013   Pneumococcal Polysaccharide-23 03/09/1995, 08/22/2014   Tdap 04/07/2012        Objective:   Physical Exam BP (!) 120/52 (BP Location: Left Arm, Patient Position: Sitting, Cuff Size: Normal)    Pulse 67    Temp (!) 97.5 F (36.4 C) (Oral)    Ht 5\' 6"  (1.676 m)    Wt 126 lb (57.2 kg)    SpO2 98%    BMI 20.34 kg/m  GENERAL: Elderly woman, frail, presents in transport chair.  No acute distress. HEAD: Normocephalic, atraumatic.  EYES: Pupils equal, round, reactive to light.  No scleral icterus.  MOUTH: Nose/mouth/throat not examined due to masking requirements for COVID 19. NECK: Supple. No thyromegaly. Trachea midline.  Mild jugular vein prominence.  No adenopathy. PULMONARY: Good air entry bilaterally.  No adventitious sounds. CARDIOVASCULAR: S1 and S2. Regular rate and rhythm.  Grade 2/6 harsh systolic ejection murmur left sternal border.  Pacemaker in situ. ABDOMEN: Scaphoid otherwise benign. MUSCULOSKELETAL: No joint deformity, no clubbing, no edema.  NEUROLOGIC: Grossly nonfocal.  Intention tremor noted.  Gait not tested. SKIN: Intact,warm,dry. PSYCH: Flat affect, otherwise behavior normal.   Chest x-ray performed 26 May 2021 showing pacemaker in situ, cardiomegaly, tiny bilateral pleural effusions and mild interstitial prominence no change when compared to priors:     Assessment & Plan:     ICD-10-CM   1. Shortness of breath  R06.02 Pulse oximetry, overnight    CANCELED: Pulse oximetry, overnight   Suspect related to congestive heart failure Obtain 2D echo Consider diuretic    2. Congestive  heart failure, unspecified HF chronicity, unspecified  heart failure type ([place])  I50.9 ECHOCARDIOGRAM COMPLETE   This issue adds complexity to her management 2D echo pending Has upcoming cardiology appointment    3. Chronic bilateral pleural effusions  J90    Bilateral pleural effusions are mirror of systemic disease Likely related to cardiac dysfunction Echo as above     Orders Placed This Encounter  Procedures   Pulse oximetry, overnight    ON RA    Standing Status:   Future    Standing Expiration Date:   05/29/2022   ECHOCARDIOGRAM COMPLETE    Standing Status:   Future    Number of Occurrences:   1    Standing Expiration Date:   11/27/2021    Order Specific Question:   Where should this test be performed    Answer:   [facility]    Order Specific Question:   Please indicate who you request to read the nuc med / echo results.    Answer:   [facility]    Order Specific Question:   Perflutren DEFINITY (image enhancing agent) should be administered unless hypersensitivity or allergy exist    Answer:   Administer Perflutren    Order Specific Question:   Reason for exam-Echo    Answer:   Congestive Heart Failure  I50.9   Patient's x-ray findings are consistent with pulmonary vascular congestion.  She may benefit from low-dose diuretic.  Will defer to primary and to cardiology.  We will obtain 2D echo to evaluate evaluate for potential valvular disease and potential pulmonary artery hypertension.  We will obtain overnight oximetry.  See her in follow-up in 6 to 8 weeks time call sooner should any new problems arise.  Renold Don, MD Advanced [facility]ch[facility]    *This note was dictated using voice recognition software/Dragon.  Despite best efforts to proofread, errors can occur which can change the meaning. Any transcriptional errors that result from this process are unintentional and may not be fully corrected at the time of dictation.

## 2021-05-29 NOTE — Patient Instructions (Signed)
We are going to get oxygen level at nighttime  Going to get an echocardiogram this will help [place] evaluate your heart valves   We provided you with a spacer for your inhaler   We will see you in follow-up in 6 to 8 weeks time call sooner should any new problems arise

## 2021-05-30 ENCOUNTER — Ambulatory Visit: Payer: Medicare Other | Admitting: Radiation Oncology

## 2021-06-03 ENCOUNTER — Ambulatory Visit: Payer: Medicare Other | Admitting: Internal Medicine

## 2021-06-03 DIAGNOSIS — E782 Mixed hyperlipidemia: Secondary | ICD-10-CM | POA: Diagnosis not present

## 2021-06-03 DIAGNOSIS — I272 Pulmonary hypertension, unspecified: Secondary | ICD-10-CM | POA: Diagnosis not present

## 2021-06-03 DIAGNOSIS — I2510 Atherosclerotic heart disease of native coronary artery without angina pectoris: Secondary | ICD-10-CM | POA: Diagnosis not present

## 2021-06-03 DIAGNOSIS — I071 Rheumatic tricuspid insufficiency: Secondary | ICD-10-CM | POA: Diagnosis not present

## 2021-06-03 DIAGNOSIS — I340 Nonrheumatic mitral (valve) insufficiency: Secondary | ICD-10-CM | POA: Diagnosis not present

## 2021-06-03 DIAGNOSIS — I208 Other forms of angina pectoris: Secondary | ICD-10-CM | POA: Diagnosis not present

## 2021-06-03 DIAGNOSIS — I10 Essential (primary) hypertension: Secondary | ICD-10-CM | POA: Diagnosis not present

## 2021-06-03 DIAGNOSIS — I482 Chronic atrial fibrillation, unspecified: Secondary | ICD-10-CM | POA: Diagnosis not present

## 2021-06-03 DIAGNOSIS — I517 Cardiomegaly: Secondary | ICD-10-CM | POA: Diagnosis not present

## 2021-06-03 DIAGNOSIS — I495 Sick sinus syndrome: Secondary | ICD-10-CM | POA: Diagnosis not present

## 2021-06-03 DIAGNOSIS — I6523 Occlusion and stenosis of bilateral carotid arteries: Secondary | ICD-10-CM | POA: Diagnosis not present

## 2021-06-11 ENCOUNTER — Ambulatory Visit
Admission: RE | Admit: 2021-06-11 | Discharge: 2021-06-11 | Disposition: A | Discharge status: Home / Self Care | Payer: Medicare Other | Source: Ambulatory Visit | Attending: Pulmonary Disease | Admitting: Pulmonary Disease

## 2021-06-11 ENCOUNTER — Other Ambulatory Visit: Payer: Self-pay

## 2021-06-11 DIAGNOSIS — E119 Type 2 diabetes mellitus without complications: Secondary | ICD-10-CM | POA: Diagnosis not present

## 2021-06-11 DIAGNOSIS — I509 Heart failure, unspecified: Secondary | ICD-10-CM | POA: Diagnosis not present

## 2021-06-11 DIAGNOSIS — I350 Nonrheumatic aortic (valve) stenosis: Secondary | ICD-10-CM | POA: Insufficient documentation

## 2021-06-11 DIAGNOSIS — I4891 Unspecified atrial fibrillation: Secondary | ICD-10-CM | POA: Diagnosis not present

## 2021-06-11 DIAGNOSIS — I110 Hypertensive heart disease with heart failure: Secondary | ICD-10-CM | POA: Diagnosis not present

## 2021-06-11 DIAGNOSIS — I340 Nonrheumatic mitral (valve) insufficiency: Secondary | ICD-10-CM | POA: Diagnosis not present

## 2021-06-11 DIAGNOSIS — Z950 Presence of cardiac pacemaker: Secondary | ICD-10-CM | POA: Diagnosis not present

## 2021-06-11 DIAGNOSIS — Z7901 Long term (current) use of anticoagulants: Secondary | ICD-10-CM | POA: Diagnosis not present

## 2021-06-11 LAB — ECHOCARDIOGRAM COMPLETE
AR max vel: 0.89 cm2
AV Area VTI: 1.07 cm2
AV Area mean vel: 0.93 cm2
AV Mean grad: 12 mmHg
AV Peak grad: 20.4 mmHg
Ao pk vel: 2.26 m/s
Area-P 1/2: 3.7 cm2
MV VTI: 1.44 cm2
S' Lateral: 2.5 cm

## 2021-06-11 NOTE — Progress Notes (Signed)
*  PRELIMINARY RESULTS* Echocardiogram 2D Echocardiogram has been performed.  Leah Holmes 06/11/2021, 10:57 AM

## 2021-06-12 ENCOUNTER — Encounter: Payer: Self-pay | Admitting: Pulmonary Disease

## 2021-06-12 DIAGNOSIS — R0683 Snoring: Secondary | ICD-10-CM | POA: Diagnosis not present

## 2021-06-12 DIAGNOSIS — G4730 Sleep apnea, unspecified: Secondary | ICD-10-CM | POA: Diagnosis not present

## 2021-06-16 DIAGNOSIS — K589 Irritable bowel syndrome without diarrhea: Secondary | ICD-10-CM | POA: Diagnosis not present

## 2021-06-16 DIAGNOSIS — Z853 Personal history of malignant neoplasm of breast: Secondary | ICD-10-CM | POA: Diagnosis not present

## 2021-06-16 DIAGNOSIS — I509 Heart failure, unspecified: Secondary | ICD-10-CM | POA: Diagnosis not present

## 2021-06-16 DIAGNOSIS — I110 Hypertensive heart disease with heart failure: Secondary | ICD-10-CM | POA: Diagnosis not present

## 2021-06-16 DIAGNOSIS — M6259 Muscle wasting and atrophy, not elsewhere classified, multiple sites: Secondary | ICD-10-CM | POA: Diagnosis not present

## 2021-06-16 DIAGNOSIS — F331 Major depressive disorder, recurrent, moderate: Secondary | ICD-10-CM | POA: Diagnosis not present

## 2021-06-16 DIAGNOSIS — Z87440 Personal history of urinary (tract) infections: Secondary | ICD-10-CM | POA: Diagnosis not present

## 2021-06-16 DIAGNOSIS — I495 Sick sinus syndrome: Secondary | ICD-10-CM | POA: Diagnosis not present

## 2021-06-16 DIAGNOSIS — I480 Paroxysmal atrial fibrillation: Secondary | ICD-10-CM | POA: Diagnosis not present

## 2021-06-16 DIAGNOSIS — I2510 Atherosclerotic heart disease of native coronary artery without angina pectoris: Secondary | ICD-10-CM | POA: Diagnosis not present

## 2021-06-16 DIAGNOSIS — E43 Unspecified severe protein-calorie malnutrition: Secondary | ICD-10-CM | POA: Diagnosis not present

## 2021-06-16 DIAGNOSIS — H04129 Dry eye syndrome of unspecified lacrimal gland: Secondary | ICD-10-CM | POA: Diagnosis not present

## 2021-06-16 DIAGNOSIS — M62830 Muscle spasm of back: Secondary | ICD-10-CM | POA: Diagnosis not present

## 2021-06-16 DIAGNOSIS — K219 Gastro-esophageal reflux disease without esophagitis: Secondary | ICD-10-CM | POA: Diagnosis not present

## 2021-06-16 DIAGNOSIS — Z86010 Personal history of colonic polyps: Secondary | ICD-10-CM | POA: Diagnosis not present

## 2021-06-16 DIAGNOSIS — E1142 Type 2 diabetes mellitus with diabetic polyneuropathy: Secondary | ICD-10-CM | POA: Diagnosis not present

## 2021-06-16 DIAGNOSIS — J309 Allergic rhinitis, unspecified: Secondary | ICD-10-CM | POA: Diagnosis not present

## 2021-06-16 DIAGNOSIS — F411 Generalized anxiety disorder: Secondary | ICD-10-CM | POA: Diagnosis not present

## 2021-06-16 DIAGNOSIS — S32020D Wedge compression fracture of second lumbar vertebra, subsequent encounter for fracture with routine healing: Secondary | ICD-10-CM | POA: Diagnosis not present

## 2021-06-16 DIAGNOSIS — Z950 Presence of cardiac pacemaker: Secondary | ICD-10-CM | POA: Diagnosis not present

## 2021-06-16 DIAGNOSIS — E782 Mixed hyperlipidemia: Secondary | ICD-10-CM | POA: Diagnosis not present

## 2021-06-16 DIAGNOSIS — Z7901 Long term (current) use of anticoagulants: Secondary | ICD-10-CM | POA: Diagnosis not present

## 2021-06-16 DIAGNOSIS — Z8616 Personal history of COVID-19: Secondary | ICD-10-CM | POA: Diagnosis not present

## 2021-06-16 DIAGNOSIS — M810 Age-related osteoporosis without current pathological fracture: Secondary | ICD-10-CM | POA: Diagnosis not present

## 2021-06-16 DIAGNOSIS — K5900 Constipation, unspecified: Secondary | ICD-10-CM | POA: Diagnosis not present

## 2021-06-18 DIAGNOSIS — I509 Heart failure, unspecified: Secondary | ICD-10-CM | POA: Diagnosis not present

## 2021-06-18 DIAGNOSIS — I480 Paroxysmal atrial fibrillation: Secondary | ICD-10-CM | POA: Diagnosis not present

## 2021-06-18 DIAGNOSIS — E1142 Type 2 diabetes mellitus with diabetic polyneuropathy: Secondary | ICD-10-CM | POA: Diagnosis not present

## 2021-06-18 DIAGNOSIS — S32020D Wedge compression fracture of second lumbar vertebra, subsequent encounter for fracture with routine healing: Secondary | ICD-10-CM | POA: Diagnosis not present

## 2021-06-18 DIAGNOSIS — I2510 Atherosclerotic heart disease of native coronary artery without angina pectoris: Secondary | ICD-10-CM | POA: Diagnosis not present

## 2021-06-18 DIAGNOSIS — I110 Hypertensive heart disease with heart failure: Secondary | ICD-10-CM | POA: Diagnosis not present

## 2021-06-20 ENCOUNTER — Telehealth: Payer: Self-pay | Admitting: Pulmonary Disease

## 2021-06-20 NOTE — Telephone Encounter (Signed)
ONO reviewed by Dr. Diona Browner does not qualify for oxygen.    Patient's daughter, cathy(DPR) is aware of results and voiced her understanding.  Nothing further needed.

## 2021-06-23 DIAGNOSIS — E782 Mixed hyperlipidemia: Secondary | ICD-10-CM

## 2021-06-23 DIAGNOSIS — Z950 Presence of cardiac pacemaker: Secondary | ICD-10-CM

## 2021-06-23 DIAGNOSIS — J309 Allergic rhinitis, unspecified: Secondary | ICD-10-CM

## 2021-06-23 DIAGNOSIS — S32020D Wedge compression fracture of second lumbar vertebra, subsequent encounter for fracture with routine healing: Secondary | ICD-10-CM | POA: Diagnosis not present

## 2021-06-23 DIAGNOSIS — Z7901 Long term (current) use of anticoagulants: Secondary | ICD-10-CM

## 2021-06-23 DIAGNOSIS — E43 Unspecified severe protein-calorie malnutrition: Secondary | ICD-10-CM

## 2021-06-23 DIAGNOSIS — Z8616 Personal history of COVID-19: Secondary | ICD-10-CM

## 2021-06-23 DIAGNOSIS — F411 Generalized anxiety disorder: Secondary | ICD-10-CM | POA: Diagnosis not present

## 2021-06-23 DIAGNOSIS — K219 Gastro-esophageal reflux disease without esophagitis: Secondary | ICD-10-CM

## 2021-06-23 DIAGNOSIS — F331 Major depressive disorder, recurrent, moderate: Secondary | ICD-10-CM | POA: Diagnosis not present

## 2021-06-23 DIAGNOSIS — Z9181 History of falling: Secondary | ICD-10-CM

## 2021-06-23 DIAGNOSIS — K589 Irritable bowel syndrome without diarrhea: Secondary | ICD-10-CM

## 2021-06-23 DIAGNOSIS — I480 Paroxysmal atrial fibrillation: Secondary | ICD-10-CM | POA: Diagnosis not present

## 2021-06-23 DIAGNOSIS — I509 Heart failure, unspecified: Secondary | ICD-10-CM | POA: Diagnosis not present

## 2021-06-23 DIAGNOSIS — I495 Sick sinus syndrome: Secondary | ICD-10-CM | POA: Diagnosis not present

## 2021-06-23 DIAGNOSIS — M62830 Muscle spasm of back: Secondary | ICD-10-CM | POA: Diagnosis not present

## 2021-06-23 DIAGNOSIS — Z86010 Personal history of colonic polyps: Secondary | ICD-10-CM

## 2021-06-23 DIAGNOSIS — K5900 Constipation, unspecified: Secondary | ICD-10-CM

## 2021-06-23 DIAGNOSIS — Z853 Personal history of malignant neoplasm of breast: Secondary | ICD-10-CM

## 2021-06-23 DIAGNOSIS — I2510 Atherosclerotic heart disease of native coronary artery without angina pectoris: Secondary | ICD-10-CM | POA: Diagnosis not present

## 2021-06-23 DIAGNOSIS — M810 Age-related osteoporosis without current pathological fracture: Secondary | ICD-10-CM | POA: Diagnosis not present

## 2021-06-23 DIAGNOSIS — H04129 Dry eye syndrome of unspecified lacrimal gland: Secondary | ICD-10-CM

## 2021-06-23 DIAGNOSIS — Z87440 Personal history of urinary (tract) infections: Secondary | ICD-10-CM

## 2021-06-23 DIAGNOSIS — M6259 Muscle wasting and atrophy, not elsewhere classified, multiple sites: Secondary | ICD-10-CM | POA: Diagnosis not present

## 2021-06-23 DIAGNOSIS — E1142 Type 2 diabetes mellitus with diabetic polyneuropathy: Secondary | ICD-10-CM | POA: Diagnosis not present

## 2021-06-23 DIAGNOSIS — I110 Hypertensive heart disease with heart failure: Secondary | ICD-10-CM | POA: Diagnosis not present

## 2021-06-24 DIAGNOSIS — I110 Hypertensive heart disease with heart failure: Secondary | ICD-10-CM | POA: Diagnosis not present

## 2021-06-24 DIAGNOSIS — I480 Paroxysmal atrial fibrillation: Secondary | ICD-10-CM | POA: Diagnosis not present

## 2021-06-24 DIAGNOSIS — I2510 Atherosclerotic heart disease of native coronary artery without angina pectoris: Secondary | ICD-10-CM | POA: Diagnosis not present

## 2021-06-24 DIAGNOSIS — S32020D Wedge compression fracture of second lumbar vertebra, subsequent encounter for fracture with routine healing: Secondary | ICD-10-CM | POA: Diagnosis not present

## 2021-06-24 DIAGNOSIS — I509 Heart failure, unspecified: Secondary | ICD-10-CM | POA: Diagnosis not present

## 2021-06-24 DIAGNOSIS — E1142 Type 2 diabetes mellitus with diabetic polyneuropathy: Secondary | ICD-10-CM | POA: Diagnosis not present

## 2021-06-28 ENCOUNTER — Ambulatory Visit (INDEPENDENT_AMBULATORY_CARE_PROVIDER_SITE_OTHER): Payer: Medicare Other

## 2021-06-28 ENCOUNTER — Encounter: Payer: Self-pay | Admitting: Emergency Medicine

## 2021-06-28 ENCOUNTER — Other Ambulatory Visit: Payer: Self-pay

## 2021-06-28 ENCOUNTER — Ambulatory Visit
Admission: EM | Admit: 2021-06-28 | Discharge: 2021-06-28 | Disposition: A | Discharge status: Home / Self Care | Payer: Medicare Other | Attending: Emergency Medicine | Admitting: Emergency Medicine

## 2021-06-28 DIAGNOSIS — I517 Cardiomegaly: Secondary | ICD-10-CM | POA: Diagnosis not present

## 2021-06-28 DIAGNOSIS — R0781 Pleurodynia: Secondary | ICD-10-CM | POA: Diagnosis not present

## 2021-06-28 DIAGNOSIS — R079 Chest pain, unspecified: Secondary | ICD-10-CM | POA: Diagnosis not present

## 2021-06-28 DIAGNOSIS — J449 Chronic obstructive pulmonary disease, unspecified: Secondary | ICD-10-CM | POA: Diagnosis not present

## 2021-06-28 DIAGNOSIS — R0789 Other chest pain: Secondary | ICD-10-CM

## 2021-06-28 DIAGNOSIS — B029 Zoster without complications: Secondary | ICD-10-CM

## 2021-06-28 MED ORDER — VALACYCLOVIR HCL 1 G PO TABS: 1000.0000 mg | ORAL_TABLET | Freq: Three times a day (TID) | ORAL | 0 refills | Status: DC

## 2021-06-28 NOTE — ED Triage Notes (Addendum)
Patient c/o right sided rib pain that started 2 days ago. Patient states that the pain comes and goes.  Patient states that the pain starts in her right mid back and comes around  and also has pain under her right shoulder blade.   Patient denies any fall or injury.  Patient denies any cold symptoms.  Patient denies SOB.

## 2021-06-28 NOTE — ED Provider Notes (Signed)
[facility]    CSN: [id number] Arrival date & time: 06/28/21  1430      History   Chief Complaint Chief Complaint  Patient presents with   Rib pain    right    HPI Leah Holmes is a 88 y.o. female.   HPI  88 year old female here for evaluation of right-sided chest wall pain.  Patient reports that she has been experiencing pain in her right chest wall for the last 2 days.  She states that it will wax and wane but never completely goes away.  It will be sharp in nature and then dull down to a more tolerable level.  She states that she does not have any shortness of breath or cough.  No sweating or nausea.  She denies any falls or injury.  She also has not seen any rash on her skin.  Past Medical History:  Diagnosis Date   Atrial fibrillation ([place]) 2012   Breast cancer ([place]) 04/2018   IDC of left breast    CHF (congestive heart failure) ([place])    Colon polyp 2003   Coronary artery disease    COVID-19 virus infection 12/25/2019   Diabetes mellitus without complication (HCC)    Dyspnea    Dysrhythmia    atrial fib   Environmental and seasonal allergies    GERD (gastroesophageal reflux disease)    Hyperlipidemia    Hypertension    Irritable bowel syndrome    Multiple rib fractures 03/15/2018   Personal history of radiation therapy    Polyp of colon    PONV (postoperative nausea and vomiting)    with ether, not recently "woke up in surgery once"   Presence of permanent cardiac pacemaker    Tremor    Tremors of nervous system    Ulcer     Patient Active Problem List   Diagnosis Date Noted   Shortness of breath 05/27/2021   Chest congestion 05/27/2021   Vitamin D deficiency 05/27/2021   Diabetes mellitus without complication ([place]) 24/26/8341   Acute non-recurrent maxillary sinusitis 05/27/2021   History of COVID-19 04/24/2021   Compression fracture of L2 lumbar vertebra (HCC)    QT prolongation    AF (paroxysmal atrial fibrillation) (HCC)     Hypokalemia    Back pain 03/10/2021   Hypertensive urgency 03/10/2021   Myalgia due to statin 09/15/2020   Acquired thrombophilia ([place]) 12/25/2019   Unintentional weight loss of 7.5% body weight or less within 3 months 12/25/2019   Closed rib fracture 09/08/2019   S/P placement of cardiac pacemaker 09/08/2019   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 09/08/2019   Pruritus 09/08/2019   Invasive ductal carcinoma of breast, female, left ([place]) 04/26/2018   Bilateral carotid artery stenosis 04/20/2018   Aortic atherosclerosis ([place]) 03/15/2018   Balance problem 03/15/2018   Personal history of fall, presenting hazards to health 03/15/2018   Mass of upper inner quadrant of left breast 03/14/2018   Other fatigue 10/27/2017   Intention tremor 04/15/2017   Osteoporosis 03/21/2016   Sick sinus syndrome ([place]) 01/28/2016   Other malaise 11/01/2015   Major depressive disorder, recurrent episode ([place]) 10/30/2015   Constipation 07/27/2015   Generalized anxiety disorder 04/02/2015   Ischemic chest pain ([place]) 01/08/2015   Chronic atrial fibrillation ([place]) 08/16/2014   Medicare annual wellness visit, subsequent 05/23/2014   Hyperlipidemia, mixed 05/08/2014   Moderate mitral insufficiency 05/08/2014   Moderate tricuspid insufficiency 05/08/2014   Dry mouth 03/10/2014   Low serum vitamin D  02/16/2014   Thrush 02/07/2014   LVH (left ventricular hypertrophy) 11/23/2013   Anal polyp 09/20/2013   Personal history of colonic polyps 08/31/2013   CAD (coronary artery disease) 03/09/2013   DM type 2 with diabetic peripheral neuropathy ([place]) 03/09/2013   Hyperlipidemia associated with type 2 diabetes mellitus ([place]) 03/09/2013   Essential hypertension, benign 03/09/2013   Nonulcer dyspepsia 03/09/2013    Past Surgical History:  Procedure Laterality Date   ABDOMINAL ADHESION SURGERY     ABDOMINAL HYSTERECTOMY     APPENDECTOMY     BREAST BIOPSY Right late 80s/early 90s   benign   BREAST BIOPSY Left  03/31/2018   [place] bx done with Dr. Bary Castilla, invasive ductal carcinoma   BREAST LUMPECTOMY Left 04/22/2018   Procedure: BREAST LUMPECTOMY;  Surgeon: Robert Bellow, MD;  Location: [facility] ORS;  Service: General;  Laterality: Left;   BREAST LUMPECTOMY Left 04/22/2018   [place], negative LN, clear margins   BREAST LUMPECTOMY Left 2020   positive, done in office   BREAST SURGERY Right    biopsy    CESAREAN SECTION     x 2   CHOLECYSTECTOMY  03/2012   Dr Burt Knack   COLONOSCOPY  2003   Dr Theodosia Paling in [place]   fibroid tumor     Organ N/A 01/28/2016   Procedure: INSERTION PACEMAKER;  Surgeon: Isaias Cowman, MD;  Location: [facility] ORS;  Service: Cardiovascular;  Laterality: N/A;   SENTINEL NODE BIOPSY Left 04/22/2018   Procedure: SENTINEL NODE BIOPSY;  Surgeon: Robert Bellow, MD;  Location: [facility] OR[facility];  Service: General;  Laterality: Left;   UPPER GI ENDOSCOPY  2003   Dr Theodosia Paling in [place]    OB History     Gravida  2   Para  2   Term      Preterm      AB      Living         SAB      IAB      Ectopic      Multiple      Live Births           Obstetric Comments  1st Menstrual Cycle:  16 1st Pregnancy:  30          Home Medications    Prior to Admission medications   Medication Sig Start Date End Date Taking? Authorizing Provider  valACYclovir (VALTREX) 1000 MG tablet Take 1 tablet (1,000 mg total) by mouth 3 (three) times daily. 06/28/21  Yes Margarette Canada, NP  acetaminophen (TYLENOL) 325 MG tablet Take 2 tablets (650 mg total) by mouth every 6 (six) hours as needed. 03/14/21   Loletha Grayer, MD  albuterol (PROAIR HFA) 108 (90 Base) MCG/ACT inhaler Inhale 1-2 puffs into the lungs every 4 (four) hours as needed for wheezing or shortness of breath. 11/17/17   Laverle Hobby, MD  amLODipine (NORVASC) 10 MG tablet Take 1 tablet (10 mg total) by mouth daily. 03/15/21   Loletha Grayer, MD  atenolol (TENORMIN) 50 MG tablet Take 1 tablet by mouth  twice daily 05/19/21   Crecencio Mc, MD  calcitonin, salmon, (MIACALCIN/FORTICAL) 200 UNIT/ACT nasal spray Place 1 spray into alternate nostrils daily. 03/15/21   Loletha Grayer, MD  Cholecalciferol (VITAMIN D3) 1000 UNITS CAPS Take 1,000 Units by mouth every morning.     [provider]  docusate sodium (COLACE) 100 MG capsule Take 2 capsules (200 mg total) by mouth 2 (two) times daily. 03/14/21  Loletha Grayer, MD  doxycycline (VIBRA-TABS) 100 MG tablet Take 1 tablet (100 mg total) by mouth 2 (two) times daily. 05/27/21   Flinchum, Kelby Aline, FNP  ergocalciferol (DRISDOL) 1.25 MG (50000 UT) capsule Take 1 capsule (50,000 Units total) by mouth once a week. 04/27/21   Crecencio Mc, MD  glucose blood (ONE TOUCH ULTRA TEST) test strip USE 1 STRIP TO CHECK GLUCOSE TWICE DAILY  Dx code: E11.9 05/29/19   Crecencio Mc, MD  glucose blood (ONE TOUCH ULTRA TEST) test strip USE ONE STRIP TO CHECK GLUCOSE ONCE  DAILY 06/02/19   Crecencio Mc, MD  hydrochlorothiazide (HYDRODIURIL) 12.5 MG tablet Take 1 tablet (12.5 mg total) by mouth daily. 03/15/21   Loletha Grayer, MD  ipratropium (ATROVENT) 0.03 % nasal spray Place 2 sprays into both nostrils every 12 (twelve) hours. 02/23/20   Crecencio Mc, MD  isosorbide mononitrate (IMDUR) 60 MG 24 hr tablet Take 60 mg by mouth 2 (two) times daily. 12/27/15   [provider]  Lancets (ONETOUCH DELICA PLUS DDUKGU54Y) [place] 2 Devices into the skin daily. DX Code E11.9. 01/30/19   Crecencio Mc, MD  lisinopril (ZESTRIL) 20 MG tablet Take 2 tablets by mouth twice daily 05/19/21   Crecencio Mc, MD  meclizine (ANTIVERT) 25 MG tablet Take 1 tablet by mouth three times daily as needed 06/24/20   Crecencio Mc, MD  mometasone (ELOCON) 0.1 % cream Apply once ot twice daily as needed for itching 09/13/20   Crecencio Mc, MD  nitroGLYCERIN (NITROSTAT) 0.4 MG SL tablet Place 0.4 mg under the tongue every 5 (five) minutes as needed for chest  pain.    [provider]  omeprazole (PRILOSEC) 20 MG capsule Take 1 capsule by mouth once daily 05/30/20   Crecencio Mc, MD  [facility] ULTRA test strip USE 1 STRIP TO CHECK GLUCOSE TWICE DAILY 05/15/19   Crecencio Mc, MD  Polyethyl Glycol-Propyl Glycol 0.4-0.3 % SOLN Place 1 drop into both eyes 3 (three) times daily as needed (for dry/irritated eyes.).    [provider]  polyethylene glycol (MIRALAX / GLYCOLAX) 17 g packet Take 17 g by mouth daily as needed.    [provider]  psyllium (METAMUCIL) 58.6 % packet Take 1 packet by mouth daily as needed (for regularity).     [provider]  spironolactone (ALDACTONE) 25 MG tablet Take 1 tablet (25 mg total) by mouth daily. 03/15/21   Loletha Grayer, MD  traMADol (ULTRAM) 50 MG tablet Take 1 tablet (50 mg total) by mouth every 12 (twelve) hours as needed for severe pain. 03/14/21   Loletha Grayer, MD  triamcinolone cream (KENALOG) 0.1 % APPLY 1 APPLICATION TOPICALLY TWICE DAILY 04/18/20   Crecencio Mc, MD  warfarin (COUMADIN) 2 MG tablet Take 2 mg by mouth 2 (two) times a week. Take 2mg  at bedtime on Wednesday & Sunday. 01/29/20   [provider]  warfarin (COUMADIN) 4 MG tablet Take 4 mg by mouth daily at 6 PM. Does not take Wednesday or Sunday.    [provider]    Family History Family History  Problem Relation Age of Onset   Breast cancer Sister    Cancer Sister 59       breast   Breast cancer Maternal Aunt 68   Breast cancer Cousin    Breast cancer Other 68    Social History Social History   Tobacco Use   Smoking status: Never  Smokeless tobacco: Former    Types: Snuff   Tobacco comments:    occasionally  Vaping Use   Vaping Use: Never used  Substance Use Topics   Alcohol use: No   Drug use: No     Allergies   Codeine, Penicillins, Amlodipine, Atorvastatin, Clonidine derivatives, Gabapentin, Losartan, Morphine, Morphine and related, Reglan [metoclopramide],  and Hydralazine   Review of Systems Review of Systems  Constitutional:  Negative for fever.  Cardiovascular:  Positive for chest pain.       Right-sided chest wall pain.  Skin:  Negative for rash.  Hematological: Negative.   Psychiatric/Behavioral: Negative.      Physical Exam Triage Vital Signs ED Triage Vitals  Enc Vitals Group     BP 06/28/21 1444 (!) 154/110     Pulse Rate 06/28/21 1444 63     Resp 06/28/21 1444 14     Temp 06/28/21 1444 98.2 F (36.8 C)     Temp Source 06/28/21 1444 Oral     SpO2 06/28/21 1444 99 %     Weight 06/28/21 1440 125 lb (56.7 kg)     Height 06/28/21 1440 5\' 6"  (1.676 m)     Head Circumference --      Peak Flow --      Pain Score 06/28/21 1440 9     Pain Loc --      Pain Edu? --      Excl. in [place]? --    No data found.  Updated Vital Signs BP (!) 154/110 (BP Location: Left Arm)    Pulse 63    Temp 98.2 F (36.8 C) (Oral)    Resp 14    Ht 5\' 6"  (1.676 m)    Wt 125 lb (56.7 kg)    SpO2 99%    BMI 20.18 kg/m   Visual Acuity Right Eye Distance:   Left Eye Distance:   Bilateral Distance:    Right Eye Near:   Left Eye Near:    Bilateral Near:     Physical Exam Vitals and nursing note reviewed.  Constitutional:      General: She is not in acute distress.    Appearance: Normal appearance. She is not ill-appearing.  HENT:     Head: Normocephalic and atraumatic.  Cardiovascular:     Rate and Rhythm: Normal rate and regular rhythm.     Pulses: Normal pulses.     Heart sounds: Normal heart sounds. No murmur heard.   No friction rub. No gallop.  Pulmonary:     Effort: Pulmonary effort is normal.     Breath sounds: Normal breath sounds. No wheezing, rhonchi or rales.  Skin:    General: Skin is warm and dry.     Capillary Refill: Capillary refill takes less than 2 seconds.     Findings: Erythema present. No rash.  Neurological:     General: No focal deficit present.     Mental Status: She is alert and oriented to person, place, and  time.  Psychiatric:        Mood and Affect: Mood normal.        Behavior: Behavior normal.        Thought Content: Thought content normal.        Judgment: Judgment normal.     UC Treatments / Results  Labs (all labs ordered are listed, but only abnormal results are displayed) Labs Reviewed - No data to display  EKG Ventricular paced rhythm with a rate of 62  bpm.   Radiology DG Chest 2 View  Result Date: 06/28/2021 CLINICAL DATA:  Right-sided chest and rib pain beginning 2 days ago. EXAM: CHEST - 2 VIEW COMPARISON:  05/26/2021 FINDINGS: Stable mild cardiomegaly. Single lead transvenous pacemaker remains in appropriate position. Aortic atherosclerotic calcification noted. Pulmonary hyperinflation is again seen, consistent with COPD. Both lungs are clear. No evidence of pneumothorax or hemothorax. IMPRESSION: Stable mild cardiomegaly and COPD. No active lung disease or other acute findings. Electronically Signed   By: Marlaine Hind M.D.   On: 06/28/2021 15:58    Procedures Procedures (including critical care time)  Medications Ordered in UC Medications - No data to display  Initial Impression / Assessment and Plan / UC Course  I have reviewed the triage vital signs and the nursing notes.  Pertinent labs & imaging results that were available during my care of the patient were reviewed by me and considered in my medical decision making (see chart for details).  Patient is a very pleasant, nontoxic-appearing 88 year old female here for evaluation of right sided posterior chest wall pain that radiates around to her right mid axillary region that is been present for last 2 days.  She describes it as sharp in nature and it will wax and wane in intensity but it never goes away.  This not been associated with any rash, injury, shortness of breath, or cough.  No sweating or nausea.  Physical exam reveals a benign cardiopulmonary exam with clear lung sounds auscultation.  Patient has normal  chest excursion.  When examining the chest wall the patient does have some fine macular clusters along the path of pain the following the course of her rib.  There are no vesicular lesions or maculopapular lesions noted.  I suspect that the patient is developing shingles.  When asked she states that her symptoms at present are similar to her previous episode of shingles.  The last time should she feel she never did develop a rash.  An EKG was collected at triage which shows a ventricular paced rhythm with a rate of 62 bpm and is noncontributory.  Chest x-ray was also obtained from triage which shows no active lung disease or acute findings.  I will prescribe Valtrex for the treatment of shingles 3 times daily x7 days.  Advised patient she can use Tylenol for mild to moderate pain.  She also has some remaining tramadol from a previous wrist fracture that I told her she can use at bedtime as needed.  I am reluctant to give her gabapentin given her advanced age.   Final Clinical Impressions(s) / UC Diagnoses   Final diagnoses:  Herpes zoster without complication     Discharge Instructions      Take the Valtrex 3 times a day for 7 days for treatment of the shingles.  If you develop a rash you will need to keep the rash covered with clothing until it dries up.  Return for new or worsening symptoms.      ED Prescriptions     Medication Sig Dispense Auth. Provider   valACYclovir (VALTREX) 1000 MG tablet Take 1 tablet (1,000 mg total) by mouth 3 (three) times daily. 21 tablet Margarette Canada, NP      PDMP not reviewed this encounter.   Margarette Canada, NP 06/28/21 1622

## 2021-06-28 NOTE — Discharge Instructions (Signed)
Take the Valtrex 3 times a day for 7 days for treatment of the shingles.  If you develop a rash you will need to keep the rash covered with clothing until it dries up.  Return for new or worsening symptoms.

## 2021-06-29 ENCOUNTER — Other Ambulatory Visit: Payer: Self-pay | Admitting: Internal Medicine

## 2021-07-01 DIAGNOSIS — I350 Nonrheumatic aortic (valve) stenosis: Secondary | ICD-10-CM | POA: Diagnosis not present

## 2021-07-01 DIAGNOSIS — I10 Essential (primary) hypertension: Secondary | ICD-10-CM | POA: Diagnosis not present

## 2021-07-01 DIAGNOSIS — E782 Mixed hyperlipidemia: Secondary | ICD-10-CM | POA: Diagnosis not present

## 2021-07-01 DIAGNOSIS — I272 Pulmonary hypertension, unspecified: Secondary | ICD-10-CM | POA: Diagnosis not present

## 2021-07-01 DIAGNOSIS — I495 Sick sinus syndrome: Secondary | ICD-10-CM | POA: Diagnosis not present

## 2021-07-01 DIAGNOSIS — I700 Atherosclerosis of aorta: Secondary | ICD-10-CM | POA: Diagnosis not present

## 2021-07-01 DIAGNOSIS — I482 Chronic atrial fibrillation, unspecified: Secondary | ICD-10-CM | POA: Diagnosis not present

## 2021-07-01 DIAGNOSIS — I6523 Occlusion and stenosis of bilateral carotid arteries: Secondary | ICD-10-CM | POA: Diagnosis not present

## 2021-07-01 DIAGNOSIS — I071 Rheumatic tricuspid insufficiency: Secondary | ICD-10-CM | POA: Diagnosis not present

## 2021-07-01 DIAGNOSIS — I2510 Atherosclerotic heart disease of native coronary artery without angina pectoris: Secondary | ICD-10-CM | POA: Diagnosis not present

## 2021-07-01 DIAGNOSIS — I517 Cardiomegaly: Secondary | ICD-10-CM | POA: Diagnosis not present

## 2021-07-01 DIAGNOSIS — I340 Nonrheumatic mitral (valve) insufficiency: Secondary | ICD-10-CM | POA: Diagnosis not present

## 2021-07-04 ENCOUNTER — Telehealth: Payer: Self-pay | Admitting: Internal Medicine

## 2021-07-04 NOTE — Telephone Encounter (Signed)
Spoke with Lauren with [facility] to give the verbal okay to discontinue PT until pt is feeling better. Lauren did state that pt is being treated for Shingles.

## 2021-07-04 NOTE — Telephone Encounter (Signed)
Lauren from [facility] home [facility] called stating she wanted to update the calendar to discontinue visit because pt is not feeling well because of shingles and is not up to doing the visit.

## 2021-07-12 ENCOUNTER — Other Ambulatory Visit: Payer: Self-pay | Admitting: Internal Medicine

## 2021-07-14 ENCOUNTER — Other Ambulatory Visit: Payer: Self-pay

## 2021-07-14 ENCOUNTER — Emergency Department
Admission: EM | Admit: 2021-07-14 | Discharge: 2021-07-15 | Disposition: A | Discharge status: Home / Self Care | Payer: Medicare Other | Attending: Emergency Medicine | Admitting: Emergency Medicine

## 2021-07-14 ENCOUNTER — Encounter: Payer: Self-pay | Admitting: Emergency Medicine

## 2021-07-14 ENCOUNTER — Emergency Department: Payer: Medicare Other

## 2021-07-14 DIAGNOSIS — I10 Essential (primary) hypertension: Secondary | ICD-10-CM | POA: Diagnosis not present

## 2021-07-14 DIAGNOSIS — S0083XA Contusion of other part of head, initial encounter: Secondary | ICD-10-CM | POA: Diagnosis not present

## 2021-07-14 DIAGNOSIS — W01198A Fall on same level from slipping, tripping and stumbling with subsequent striking against other object, initial encounter: Secondary | ICD-10-CM | POA: Insufficient documentation

## 2021-07-14 DIAGNOSIS — W19XXXA Unspecified fall, initial encounter: Secondary | ICD-10-CM | POA: Diagnosis not present

## 2021-07-14 DIAGNOSIS — S0990XA Unspecified injury of head, initial encounter: Secondary | ICD-10-CM

## 2021-07-14 DIAGNOSIS — S5001XA Contusion of right elbow, initial encounter: Secondary | ICD-10-CM | POA: Insufficient documentation

## 2021-07-14 DIAGNOSIS — S60211A Contusion of right wrist, initial encounter: Secondary | ICD-10-CM | POA: Insufficient documentation

## 2021-07-14 DIAGNOSIS — Y92009 Unspecified place in unspecified non-institutional (private) residence as the place of occurrence of the external cause: Secondary | ICD-10-CM | POA: Diagnosis not present

## 2021-07-14 DIAGNOSIS — I4891 Unspecified atrial fibrillation: Secondary | ICD-10-CM | POA: Diagnosis not present

## 2021-07-14 DIAGNOSIS — S0512XA Contusion of eyeball and orbital tissues, left eye, initial encounter: Secondary | ICD-10-CM | POA: Diagnosis not present

## 2021-07-14 DIAGNOSIS — Z7901 Long term (current) use of anticoagulants: Secondary | ICD-10-CM | POA: Insufficient documentation

## 2021-07-14 DIAGNOSIS — Z043 Encounter for examination and observation following other accident: Secondary | ICD-10-CM | POA: Diagnosis not present

## 2021-07-14 DIAGNOSIS — R739 Hyperglycemia, unspecified: Secondary | ICD-10-CM | POA: Diagnosis not present

## 2021-07-14 DIAGNOSIS — M47812 Spondylosis without myelopathy or radiculopathy, cervical region: Secondary | ICD-10-CM | POA: Diagnosis not present

## 2021-07-14 DIAGNOSIS — Z950 Presence of cardiac pacemaker: Secondary | ICD-10-CM | POA: Diagnosis not present

## 2021-07-14 DIAGNOSIS — R52 Pain, unspecified: Secondary | ICD-10-CM | POA: Diagnosis not present

## 2021-07-14 LAB — BASIC METABOLIC PANEL
Anion gap: 9 (ref 5–15)
BUN: 16 mg/dL (ref 8–23)
CO2: 30 mmol/L (ref 22–32)
Calcium: 9.5 mg/dL (ref 8.9–10.3)
Chloride: 93 mmol/L — ABNORMAL LOW (ref 98–111)
Creatinine, Ser: 0.67 mg/dL (ref 0.44–1.00)
GFR, Estimated: >60 mL/min (ref >60)
Glucose, Bld: 282 mg/dL — ABNORMAL HIGH (ref 70–99)
Potassium: 3.8 mmol/L (ref 3.5–5.1)
Sodium: 132 mmol/L — ABNORMAL LOW (ref 135–145)

## 2021-07-14 LAB — CBC WITH DIFFERENTIAL/PLATELET
Abs Immature Granulocytes: 0.04 10*3/uL (ref 0.00–0.07)
Basophils Absolute: 0 10*3/uL (ref 0.0–0.1)
Basophils Relative: 0 %
Eosinophils Absolute: 0 10*3/uL (ref 0.0–0.5)
Eosinophils Relative: 0 %
HCT: 45.7 % (ref 36.0–46.0)
Hemoglobin: 15.1 g/dL — ABNORMAL HIGH (ref 12.0–15.0)
Immature Granulocytes: 0 %
Lymphocytes Relative: 8 %
Lymphs Abs: 0.8 10*3/uL (ref 0.7–4.0)
MCH: 32.5 pg (ref 26.0–34.0)
MCHC: 33 g/dL (ref 30.0–36.0)
MCV: 98.3 fL (ref 80.0–100.0)
Monocytes Absolute: 0.8 10*3/uL (ref 0.1–1.0)
Monocytes Relative: 9 %
Neutro Abs: 7.6 10*3/uL (ref 1.7–7.7)
Neutrophils Relative %: 83 %
Platelets: 220 10*3/uL (ref 150–400)
RBC: 4.65 MIL/uL (ref 3.87–5.11)
RDW: 12.4 % (ref 11.5–15.5)
WBC: 9.3 10*3/uL (ref 4.0–10.5)
nRBC: 0 % (ref 0.0–0.2)

## 2021-07-14 LAB — PROTIME-INR
INR: 2 — ABNORMAL HIGH (ref 0.8–1.2)
Prothrombin Time: 23 seconds — ABNORMAL HIGH (ref 11.4–15.2)

## 2021-07-14 NOTE — ED Triage Notes (Signed)
EMS brings pt in from home for report of fall at 9am; bruising beneath left eye; denies LOC; currently taking coumadin; bruising also noted to rt wrist

## 2021-07-14 NOTE — ED Provider Notes (Signed)
[facility] Provider Note    Event Date/Time   First MD Initiated Contact with Patient 07/14/21 2307     (approximate)   History   Fall   HPI  Leah Holmes is a 88 y.o. female whose medical history includes atrial fibrillation and a pacemaker on warfarin for long-term.  She has had falls in the past due to unstable gait and has fractured ribs previously.  She presents tonight by private vehicle with her daughter for evaluation after a fall at home.  She fell earlier today and struck the left side of her face, and possibly her right wrist and elbow because they are bruised.  However she has some pain in the left side of her face but nowhere else except for the right flank which she said has been hurting for a long time, for longer than her fall.  She did not lose consciousness and has no neck pain.  No mental status changes.  She is unsteady at baseline.  Her daughter is with her and confirms that they just want to make sure she did not sustain any severe injuries as a result of her fall, particularly because she has been on warfarin for a long time.     Physical Exam   Triage Vital Signs: ED Triage Vitals  Enc Vitals Group     BP 07/14/21 1954 (!) 151/109     Pulse Rate 07/14/21 1954 68     Resp 07/14/21 1954 14     Temp 07/14/21 1954 98.2 F (36.8 C)     Temp src --      SpO2 07/14/21 1922 95 %     Weight 07/14/21 1955 56.7 kg (125 lb)     Height 07/14/21 1955 1.676 m (5\' 6" )     Head Circumference --      Peak Flow --      Pain Score 07/14/21 1955 6     Pain Loc --      Pain Edu? --      Excl. in [place]? --     Most recent vital signs: Vitals:   07/14/21 1922 07/14/21 1954  BP:  (!) 151/109  Pulse:  68  Resp:  14  Temp:  98.2 F (36.8 C)  SpO2: 95% 94%     General: Awake, no distress.  CV:  Good peripheral perfusion.  Normal capillary refill. Resp:  Normal effort.  No accessory muscle usage. Abd:  No distention.   Other:  Patient has obvious left-sided facial trauma with extensive ecchymoses on the left side of the forehead and cheek.  However there is no significant swelling, extraocular motion is intact, no hyphema, no subconjunctival hemorrhage, no angioedema.  Mild tenderness to palpation infraorbital/left cheek, but no significant pain.  No tenderness to palpation of the cervical spine.  No battle sign.  No pain or tenderness with flexion/extension or rotation side to side of head and neck.  She has some bruising to her right wrist and right elbow but completely normal active range of motion, no pain or tenderness, no swelling.   ED Results / Procedures / Treatments   Labs (all labs ordered are listed, but only abnormal results are displayed) Labs Reviewed  PROTIME-INR - Abnormal; Notable for the following components:      Result Value   Prothrombin Time 23.0 (*)    INR 2.0 (*)    All other components within normal limits  CBC WITH DIFFERENTIAL/PLATELET - Abnormal; Notable for  the following components:   Hemoglobin 15.1 (*)    All other components within normal limits  BASIC METABOLIC PANEL - Abnormal; Notable for the following components:   Sodium 132 (*)    Chloride 93 (*)    Glucose, Bld 282 (*)    All other components within normal limits     RADIOLOGY I personally reviewed the patient's CT scans of the head, face, and cervical spine, and I see no evidence of fracture/dislocation or intracranial bleeding.  The radiologist agrees and confirmed no evidence of acute traumatic injury.   IMPRESSION / MDM / ASSESSMENT AND PLAN / ED COURSE  I reviewed the triage vital signs and the nursing notes.                              Differential diagnosis includes, but is not limited to, fracture, dislocation, contusion, intracranial bleeding.  Patient seems to be at her baseline, confirmed by her daughter.  Vital signs are stable other than hypertension.  She is very talkative and  communicative, able to tell me about what happened.  She is in no distress and has been stable for nearly 5 hours in the emergency department.  Although she has bruising to her arms, she has no pain or tenderness and no impediment to range of motion.  I offered x-rays but the daughter, patient, and I all agree that they are of little benefit given extremely low risk of fracture.  CT scans of the head, face, and cervical spine ordered based on the obvious facial trauma and the history of warfarin use.  As documented above I reviewed the results and there is no evidence of any acute or emergent traumatic injury and the radiologist agrees.  Patient and daughter are reassured by this.  They are comfortable with the plan for discharge home and outpatient follow-up and I see no evidence of high risk to indicate additional work-up or admission to the hospital is warranted.  I gave my usual and customary follow-up recommendations and return precautions.  Patient was advised to hold off on warfarin until tomorrow.   FINAL CLINICAL IMPRESSION(S) / ED DIAGNOSES   Final diagnoses:  Fall, initial encounter  Facial contusion, initial encounter  Minor head injury, initial encounter  Current use of long term anticoagulation     Rx / DC Orders   ED Discharge Orders     None        Note:  This document was prepared using Dragon voice recognition software and may include unintentional dictation errors.   Hinda Kehr, MD 07/14/21 [phone]

## 2021-07-14 NOTE — ED Triage Notes (Signed)
Pt presents for fall at home. Bruising under left eye noted. Pt denies LOC or dizziness. Pt states she had to use cane instead of walker and tripped over rug. Pt reports hitting head and nose as well as right wrist pain. Pt alert and oriented x4.

## 2021-07-14 NOTE — Discharge Instructions (Signed)
You have been seen in the Emergency Department (ED) today for a fall.  Your work up does not show any concerning injuries.  Please take over-the-counter ibuprofen and/or Tylenol as needed for your pain (unless you have an allergy or your doctor as told you not to take them), or take any prescribed medication as instructed, though you will likely want to wait until tomorrow to resume your warfarin.  You can also coordinate this with your primary care provider.  Fortunately your CT scans were reassuring today with no evidence of fracture nor bleeding inside your head.  Please follow up with your doctor regarding today's Emergency Department (ED) visit and your recent fall.    Return to the ED if you have any headache, confusion, slurred speech, weakness/numbness of any arm or leg, or any increased pain.

## 2021-07-16 ENCOUNTER — Other Ambulatory Visit: Payer: Self-pay

## 2021-07-16 ENCOUNTER — Ambulatory Visit (INDEPENDENT_AMBULATORY_CARE_PROVIDER_SITE_OTHER): Payer: Medicare Other | Admitting: Internal Medicine

## 2021-07-16 ENCOUNTER — Encounter: Payer: Self-pay | Admitting: Internal Medicine

## 2021-07-16 DIAGNOSIS — I10 Essential (primary) hypertension: Secondary | ICD-10-CM

## 2021-07-16 DIAGNOSIS — Z9181 History of falling: Secondary | ICD-10-CM | POA: Diagnosis not present

## 2021-07-16 NOTE — Patient Instructions (Addendum)
Stop the hctz due to low sodium  If bp goes above 150/90,  you should  resume spironolactone instead (better for people with low potassium/sodium)  You can continue atenolol 25 mg twice daily unless your pulse drops < 60 .   Repeat labs after March 12

## 2021-07-16 NOTE — Assessment & Plan Note (Signed)
Stopping hctz .  Reduce atenolol dose to 12.5  Mmg bid fro symptomatic bradycadia

## 2021-07-16 NOTE — Progress Notes (Signed)
Subjective:  Patient ID: Leah Holmes, female    DOB: [birth year]  Age: 88 y.o. MRN: [id number]  CC: There were no encounter diagnoses.   This visit occurred during the SARS-CoV-2 public health emergency.  Safety protocols were in place, including screening questions prior to the visit, additional usage of staff PPE, and extensive cleaning of exam room while observing appropriate contact time as indicated for disinfecting solutions.       HPI [name] presents for No chief complaint on file.   1) recent  unwitnessed fall at home with facial trauma and  prolonged immobilization.( Was unable to get up for 5 hours)   Occurred on Jan 30.  Taken to ED by family.  Head Ct an C spine donse,  due to use of warfarin. No fractures or SDH. . has been using a walker at home and was not using it when she fell .was using a cane which slipped out.    2) new onset  tremor since taking valtrex for shingles  prescribed by urgent care on jan 8 .  Took 8 doses .  (Tremor has been reported in post marketing analyses )   Outpatient Medications Prior to Visit  Medication Sig Dispense Refill   acetaminophen (TYLENOL) 325 MG tablet Take 2 tablets (650 mg total) by mouth every 6 (six) hours as needed.     albuterol (PROAIR HFA) 108 (90 Base) MCG/ACT inhaler Inhale 1-2 puffs into the lungs every 4 (four) hours as needed for wheezing or shortness of breath. 1 Inhaler 5   amLODipine (NORVASC) 10 MG tablet Take 1 tablet (10 mg total) by mouth daily. 30 tablet 0   atenolol (TENORMIN) 50 MG tablet Take 1 tablet by mouth twice daily 180 tablet 0   calcitonin, salmon, (MIACALCIN/FORTICAL) 200 UNIT/ACT nasal spray Place 1 spray into alternate nostrils daily. 3.7 mL 0   Cholecalciferol (VITAMIN D3) 1000 UNITS CAPS Take 1,000 Units by mouth every morning.      docusate sodium (COLACE) 100 MG capsule Take 2 capsules (200 mg total) by mouth 2 (two) times daily. 60 capsule 0   doxycycline  (VIBRA-TABS) 100 MG tablet Take 1 tablet (100 mg total) by mouth 2 (two) times daily. 10 tablet 0   ergocalciferol (DRISDOL) 1.25 MG (50000 UT) capsule Take 1 capsule (50,000 Units total) by mouth once a week. 12 capsule 0   glucose blood (ONE TOUCH ULTRA TEST) test strip USE 1 STRIP TO CHECK GLUCOSE TWICE DAILY  Dx code: E11.9 200 each 3   glucose blood (ONE TOUCH ULTRA TEST) test strip USE ONE STRIP TO CHECK GLUCOSE ONCE  DAILY 100 each 3   hydrochlorothiazide (HYDRODIURIL) 12.5 MG tablet Take 1 tablet (12.5 mg total) by mouth daily. 30 tablet 0   ipratropium (ATROVENT) 0.03 % nasal spray Place 2 sprays into both nostrils every 12 (twelve) hours. 30 mL 12   isosorbide mononitrate (IMDUR) 60 MG 24 hr tablet Take 60 mg by mouth 2 (two) times daily.     Lancets (ONETOUCH DELICA PLUS [id number]) MISC Inject 2 Devices into the skin daily. DX Code E11.9. 200 each 2   lisinopril (ZESTRIL) 20 MG tablet Take 2 tablets by mouth twice daily 180 tablet 0   meclizine (ANTIVERT) 25 MG tablet Take 1 tablet by mouth three times daily as needed 90 tablet 0   mometasone (ELOCON) 0.1 % cream Apply once ot twice daily as needed for itching 15 g 1   nitroGLYCERIN (NITROSTAT) 0.4  MG SL tablet Place 0.4 mg under the tongue every 5 (five) minutes as needed for chest pain.     omeprazole (PRILOSEC) 20 MG capsule Take 1 capsule by mouth once daily 90 capsule 0   ONETOUCH ULTRA test strip USE 1 STRIP TO CHECK GLUCOSE TWICE DAILY 100 each 0   Polyethyl Glycol-Propyl Glycol 0.4-0.3 % SOLN Place 1 drop into both eyes 3 (three) times daily as needed (for dry/irritated eyes.).     polyethylene glycol (MIRALAX / GLYCOLAX) 17 g packet Take 17 g by mouth daily as needed.     psyllium (METAMUCIL) 58.6 % packet Take 1 packet by mouth daily as needed (for regularity).      spironolactone (ALDACTONE) 25 MG tablet Take 1 tablet (25 mg total) by mouth daily. 30 tablet 0   traMADol (ULTRAM) 50 MG tablet Take 1 tablet (50 mg total) by  mouth every 12 (twelve) hours as needed for severe pain. 8 tablet 0   triamcinolone cream (KENALOG) 0.1 % APPLY 1 APPLICATION TOPICALLY TWICE DAILY 30 g 0   valACYclovir (VALTREX) 1000 MG tablet Take 1 tablet (1,000 mg total) by mouth 3 (three) times daily. 21 tablet 0   warfarin (COUMADIN) 2 MG tablet Take 2 mg by mouth 2 (two) times a week. Take 2mg  at bedtime on Wednesday & Sunday.     warfarin (COUMADIN) 4 MG tablet Take 4 mg by mouth daily at 6 PM. Does not take Wednesday or Sunday.     No facility-administered medications prior to visit.    Review of Systems;  Patient denies headache, fevers, malaise, unintentional weight loss, skin rash, eye pain, sinus congestion and sinus pain, sore throat, dysphagia,  hemoptysis , cough, dyspnea, wheezing, chest pain, palpitations, orthopnea, edema, abdominal pain, nausea, melena, diarrhea, constipation, flank pain, dysuria, hematuria, urinary  Frequency, nocturia, numbness, tingling, seizures,  Focal weakness, Loss of consciousness,  Tremor, insomnia, depression, anxiety, and suicidal ideation.      Objective:  There were no vitals taken for this visit.  BP Readings from Last 3 Encounters:  07/15/21 (!) 171/79  06/28/21 (!) 154/110  05/29/21 (!) 120/52    Wt Readings from Last 3 Encounters:  07/14/21 125 lb (56.7 kg)  06/28/21 125 lb (56.7 kg)  05/29/21 126 lb (57.2 kg)    General appearance: alert, cooperative and appears stated age Face: large ecchymosis of left orbital arena and zygoma Patient ID: Leah Holmes, female    DOB: 1926/08/04  Age: 88 y.o. MRN: [id number]  The patient is here for annual Medicare wellness examination and management of other chronic and acute problems.   The risk factors are reflected in the social history.  The roster of all physicians providing medical care to patient - is listed in the Snapshot section of the chart.  Activities of daily living:  The patient is 100% independent in all ADLs:  dressing, toileting, feeding as well as independent mobility  Home safety : The patient has smoke detectors in the home. They wear seatbelts.  There are no firearms at home. There is no violence in the home.   There is no risks for hepatitis, STDs or HIV. There is no   history of blood transfusion. They have no travel history to infectious disease endemic areas of the world.  The patient has seen their dentist in the last six month. They have seen their eye doctor in the last year. They admit to slight hearing difficulty with regard to whispered voices and some  television programs.  They have deferred audiologic testing in the last year.  They do not  have excessive sun exposure. Discussed the need for sun protection: hats, long sleeves and use of sunscreen if there is significant sun exposure.   Diet: the importance of a healthy diet is discussed. They do have a healthy diet.  The benefits of regular aerobic exercise were discussed. She walks 4 times per week ,  20 minutes.   Depression screen: there are no signs or vegative symptoms of depression- irritability, change in appetite, anhedonia, sadness/tearfullness.  Cognitive assessment: the patient manages all their financial and personal affairs and is actively engaged. They could relate day,date,year and events; recalled 2/3 objects at 3 minutes; performed clock-face test normally.  The following portions of the patient's history were reviewed and updated as appropriate: allergies, current medications, past family history, past medical history,  past surgical history, past social history  and problem list.  Visual acuity was not assessed per patient preference since she has regular follow up with her ophthalmologist. Hearing and body mass index were assessed and reviewed.   During the course of the visit the patient was educated and counseled about appropriate screening and preventive services including : fall prevention , diabetes screening,  nutrition counseling, colorectal cancer screening, and recommended immunizations.    CC: There were no encounter diagnoses.  History Evian has a past medical history of Atrial fibrillation ([place]) (2012), Breast cancer ([place]) (04/2018), CHF (congestive heart failure) ([place]), Colon polyp (2003), Coronary artery disease, COVID-19 virus infection (12/25/2019), Diabetes mellitus without complication ([place]), Dyspnea, Dysrhythmia, Environmental and seasonal allergies, GERD (gastroesophageal reflux disease), Hyperlipidemia, Hypertension, Irritable bowel syndrome, Multiple rib fractures (03/15/2018), Personal history of radiation therapy, Polyp of colon, PONV (postoperative nausea and vomiting), Presence of permanent cardiac pacemaker, Tremor, Tremors of nervous system, and Ulcer.   She has a past surgical history that includes Appendectomy; Abdominal hysterectomy; Colonoscopy (2003); Upper gi endoscopy (2003); fibroid tumor; Abdominal adhesion surgery; Breast surgery (Right); Cesarean section; Pacemaker insertion (N/A, 01/28/2016); Cholecystectomy (03/2012); Sentinel node biopsy (Left, 04/22/2018); Breast biopsy (Right, late 80s/early 90s); Breast biopsy (Left, 03/31/2018); Breast lumpectomy (Left, 04/22/2018); Breast lumpectomy (Left, 04/22/2018); and Breast lumpectomy (Left, 2020).   Her family history includes Breast cancer in her cousin and sister; Breast cancer (age of onset: 47) in an other family member; Breast cancer (age of onset: 72) in her maternal aunt; Cancer (age of onset: 17) in her sister.She reports that she has never smoked. She has quit using smokeless tobacco.  Her smokeless tobacco use included snuff. She reports that she does not drink alcohol and does not use drugs.  Outpatient Medications Prior to Visit  Medication Sig Dispense Refill   acetaminophen (TYLENOL) 325 MG tablet Take 2 tablets (650 mg total) by mouth every 6 (six) hours as needed.     albuterol (PROAIR HFA) 108 (90 Base)  MCG/ACT inhaler Inhale 1-2 puffs into the lungs every 4 (four) hours as needed for wheezing or shortness of breath. 1 Inhaler 5   amLODipine (NORVASC) 10 MG tablet Take 1 tablet (10 mg total) by mouth daily. 30 tablet 0   atenolol (TENORMIN) 25 MG tablet Take 25 mg by mouth 2 (two) times daily.     calcitonin, salmon, (MIACALCIN/FORTICAL) 200 UNIT/ACT nasal spray Place 1 spray into alternate nostrils daily. 3.7 mL 0   Cholecalciferol (VITAMIN D3) 1000 UNITS CAPS Take 1,000 Units by mouth every morning.      docusate sodium (COLACE) 100 MG capsule Take  2 capsules (200 mg total) by mouth 2 (two) times daily. 60 capsule 0   glucose blood (ONE TOUCH ULTRA TEST) test strip USE 1 STRIP TO CHECK GLUCOSE TWICE DAILY  Dx code: E11.9 200 each 3   glucose blood (ONE TOUCH ULTRA TEST) test strip USE ONE STRIP TO CHECK GLUCOSE ONCE  DAILY 100 each 3   ipratropium (ATROVENT) 0.03 % nasal spray Place 2 sprays into both nostrils every 12 (twelve) hours. 30 mL 12   isosorbide mononitrate (IMDUR) 60 MG 24 hr tablet Take 60 mg by mouth 2 (two) times daily.     Lancets (ONETOUCH DELICA PLUS [id number]) MISC Inject 2 Devices into the skin daily. DX Code E11.9. 200 each 2   lisinopril (ZESTRIL) 20 MG tablet Take 2 tablets by mouth twice daily 180 tablet 0   meclizine (ANTIVERT) 25 MG tablet Take 1 tablet by mouth three times daily as needed 90 tablet 0   mometasone (ELOCON) 0.1 % cream Apply once ot twice daily as needed for itching 15 g 1   nitroGLYCERIN (NITROSTAT) 0.4 MG SL tablet Place 0.4 mg under the tongue every 5 (five) minutes as needed for chest pain.     omeprazole (PRILOSEC) 20 MG capsule Take 1 capsule by mouth once daily 90 capsule 0   ONETOUCH ULTRA test strip USE 1 STRIP TO CHECK GLUCOSE TWICE DAILY 100 each 0   Polyethyl Glycol-Propyl Glycol 0.4-0.3 % SOLN Place 1 drop into both eyes 3 (three) times daily as needed (for dry/irritated eyes.).     polyethylene glycol (MIRALAX / GLYCOLAX) 17 g packet Take  17 g by mouth daily as needed.     psyllium (METAMUCIL) 58.6 % packet Take 1 packet by mouth daily as needed (for regularity).      triamcinolone cream (KENALOG) 0.1 % APPLY 1 APPLICATION TOPICALLY TWICE DAILY 30 g 0   warfarin (COUMADIN) 2 MG tablet Take 2 mg by mouth 2 (two) times a week. Take 2mg  at bedtime on Wednesday & Sunday.     warfarin (COUMADIN) 4 MG tablet Take 4 mg by mouth daily at 6 PM. Does not take Wednesday or Sunday.     hydrochlorothiazide (HYDRODIURIL) 12.5 MG tablet Take 1 tablet (12.5 mg total) by mouth daily. 30 tablet 0   atenolol (TENORMIN) 50 MG tablet Take 1 tablet by mouth twice daily 180 tablet 0   doxycycline (VIBRA-TABS) 100 MG tablet Take 1 tablet (100 mg total) by mouth 2 (two) times daily. 10 tablet 0   ergocalciferol (DRISDOL) 1.25 MG (50000 UT) capsule Take 1 capsule (50,000 Units total) by mouth once a week. 12 capsule 0   spironolactone (ALDACTONE) 25 MG tablet Take 1 tablet (25 mg total) by mouth daily. (Patient not taking: Reported on 07/16/2021) 30 tablet 0   traMADol (ULTRAM) 50 MG tablet Take 1 tablet (50 mg total) by mouth every 12 (twelve) hours as needed for severe pain. (Patient not taking: Reported on 07/16/2021) 8 tablet 0   valACYclovir (VALTREX) 1000 MG tablet Take 1 tablet (1,000 mg total) by mouth 3 (three) times daily. 21 tablet 0   No facility-administered medications prior to visit.    Review of Systems  Objective:  BP (!) 142/58 (BP Location: Left Arm, Patient Position: Sitting, Cuff Size: Normal)    Pulse 65    Temp 97.9 F (36.6 C) (Oral)    Ht 5\' 6"  (1.676 m)    Wt 123 lb 12.8 oz (56.2 kg)    SpO2 98%  BMI 19.98 kg/m   Physical Exam  Physical Exam   Assessment & Plan:   Problem List Items Addressed This Visit   None   I have discontinued Ifeoma L. Prophete's hydrochlorothiazide, traMADol, spironolactone, ergocalciferol, doxycycline, and valACYclovir. I am also having her maintain her nitroGLYCERIN, psyllium, Vitamin D3,  isosorbide mononitrate, albuterol, Polyethyl Glycol-Propyl Glycol, warfarin, OneTouch Delica Plus [id number], OneTouch Ultra, glucose blood, glucose blood, warfarin, ipratropium, triamcinolone cream, meclizine, mometasone, polyethylene glycol, amLODipine, calcitonin (salmon), docusate sodium, acetaminophen, lisinopril, omeprazole, and atenolol.  No orders of the defined types were placed in this encounter.   Medications Discontinued During This Encounter  Medication Reason   doxycycline (VIBRA-TABS) 100 MG tablet    ergocalciferol (DRISDOL) 1.25 MG (50000 UT) capsule    valACYclovir (VALTREX) 1000 MG tablet    atenolol (TENORMIN) 50 MG tablet Change in therapy   traMADol (ULTRAM) 50 MG tablet    spironolactone (ALDACTONE) 25 MG tablet    hydrochlorothiazide (HYDRODIURIL) 12.5 MG tablet     Follow-up: No follow-ups on file.   Crecencio Mc, MD  Ears: normal TM's and external ear canals both ears Throat: lips, mucosa, and tongue normal; teeth and gums normal Neck: no adenopathy, no carotid bruit, supple, symmetrical, trachea midline and thyroid not enlarged, symmetric, no tenderness/mass/nodules Back: symmetric, no curvature. ROM normal. No CVA tenderness. Lungs: clear to auscultation bilaterally Heart: regular rate and rhythm, S1, S2 normal, no murmur, click, rub or gallop Abdomen: soft, non-tender; bowel sounds normal; no masses,  no organomegaly Pulses: 2+ and symmetric Skin: Skin color, texture, turgor normal. No rashes or lesions Lymph nodes: Cervical, supraclavicular, and axillary nodes normal.  Lab Results  Component Value Date   HGBA1C 7.5 (H) 05/26/2021   HGBA1C 6.7 (H) 09/13/2020   HGBA1C 6.8 (H) 02/23/2020    Lab Results  Component Value Date   CREATININE 0.67 07/14/2021   CREATININE 0.49 05/26/2021   CREATININE 0.59 04/24/2021    Lab Results  Component Value Date   WBC 9.3 07/14/2021   HGB 15.1 (H) 07/14/2021   HCT 45.7 07/14/2021   PLT 220 07/14/2021    GLUCOSE 282 (H) 07/14/2021   CHOL 174 09/13/2020   TRIG 63.0 09/13/2020   HDL 60.10 09/13/2020   LDLDIRECT 116.0 10/30/2015   LDLCALC 101 (H) 09/13/2020   ALT 14 05/26/2021   AST 17 05/26/2021   NA 132 (L) 07/14/2021   K 3.8 07/14/2021   CL 93 (L) 07/14/2021   CREATININE 0.67 07/14/2021   BUN 16 07/14/2021   CO2 30 07/14/2021   TSH 0.66 05/26/2021   INR 2.0 (H) 07/14/2021   HGBA1C 7.5 (H) 05/26/2021   MICROALBUR 1.0 02/23/2020    CT HEAD WO CONTRAST (5MM)  Result Date: 07/14/2021 CLINICAL DATA:  Fall, bruising under left eye EXAM: CT HEAD WITHOUT CONTRAST CT MAXILLOFACIAL WITHOUT CONTRAST CT CERVICAL SPINE WITHOUT CONTRAST TECHNIQUE: Multidetector CT imaging of the head, cervical spine, and maxillofacial structures were performed using the standard protocol without intravenous contrast. Multiplanar CT image reconstructions of the cervical spine and maxillofacial structures were also generated. RADIATION DOSE REDUCTION: This exam was performed according to the departmental dose-optimization program which includes automated exposure control, adjustment of the mA and/or kV according to patient size and/or use of iterative reconstruction technique. COMPARISON:  None. FINDINGS: CT HEAD FINDINGS Brain: No evidence of acute infarction, hemorrhage, hydrocephalus, extra-axial collection or mass lesion/mass effect. Global cortical atrophy, likely age appropriate. Subcortical white matter and periventricular small vessel ischemic changes. Right basal ganglia/caudate  lacunar infarcts. Vascular: Intracranial atherosclerosis. Skull: Normal. Negative for fracture or focal lesion. Other: None. CT MAXILLOFACIAL FINDINGS Osseous: No evidence of maxillofacial fracture. Mandible is intact. Bilateral mandibular condyles are well-seated in the TMJs. Orbits: Bilateral orbits, including the globes and retroconal soft tissues, within normal limits Sinuses: The visualized paranasal sinuses are essentially clear. The  mastoid air cells are unopacified. Soft tissues: Mild soft tissue swelling overlying the left maxilla (series 2/image 29). CT CERVICAL SPINE FINDINGS Alignment: Normal cervical lordosis. Skull base and vertebrae: No acute fracture. No primary bone lesion or focal pathologic process. Soft tissues and spinal canal: No prevertebral fluid or swelling. No visible canal hematoma. Disc levels: Mild multilevel degenerative changes. Spinal canal is patent. Upper chest: Visualized lung apices are clear. Other: Visualized thyroid is grossly unremarkable. IMPRESSION: No evidence of acute intracranial abnormality. Atrophy with small vessel ischemic changes. Old right basal ganglia/caudate lacunar infarcts. No evidence of maxillofacial fracture. Mild soft tissue swelling overlying the left maxilla. No evidence of traumatic injury to the cervical spine. Mild multilevel degenerative changes. Electronically Signed   By: Julian Hy M.D.   On: 07/14/2021 21:09   CT Cervical Spine Wo Contrast  Result Date: 07/14/2021 CLINICAL DATA:  Fall, bruising under left eye EXAM: CT HEAD WITHOUT CONTRAST CT MAXILLOFACIAL WITHOUT CONTRAST CT CERVICAL SPINE WITHOUT CONTRAST TECHNIQUE: Multidetector CT imaging of the head, cervical spine, and maxillofacial structures were performed using the standard protocol without intravenous contrast. Multiplanar CT image reconstructions of the cervical spine and maxillofacial structures were also generated. RADIATION DOSE REDUCTION: This exam was performed according to the departmental dose-optimization program which includes automated exposure control, adjustment of the mA and/or kV according to patient size and/or use of iterative reconstruction technique. COMPARISON:  None. FINDINGS: CT HEAD FINDINGS Brain: No evidence of acute infarction, hemorrhage, hydrocephalus, extra-axial collection or mass lesion/mass effect. Global cortical atrophy, likely age appropriate. Subcortical white matter and  periventricular small vessel ischemic changes. Right basal ganglia/caudate lacunar infarcts. Vascular: Intracranial atherosclerosis. Skull: Normal. Negative for fracture or focal lesion. Other: None. CT MAXILLOFACIAL FINDINGS Osseous: No evidence of maxillofacial fracture. Mandible is intact. Bilateral mandibular condyles are well-seated in the TMJs. Orbits: Bilateral orbits, including the globes and retroconal soft tissues, within normal limits Sinuses: The visualized paranasal sinuses are essentially clear. The mastoid air cells are unopacified. Soft tissues: Mild soft tissue swelling overlying the left maxilla (series 2/image 29). CT CERVICAL SPINE FINDINGS Alignment: Normal cervical lordosis. Skull base and vertebrae: No acute fracture. No primary bone lesion or focal pathologic process. Soft tissues and spinal canal: No prevertebral fluid or swelling. No visible canal hematoma. Disc levels: Mild multilevel degenerative changes. Spinal canal is patent. Upper chest: Visualized lung apices are clear. Other: Visualized thyroid is grossly unremarkable. IMPRESSION: No evidence of acute intracranial abnormality. Atrophy with small vessel ischemic changes. Old right basal ganglia/caudate lacunar infarcts. No evidence of maxillofacial fracture. Mild soft tissue swelling overlying the left maxilla. No evidence of traumatic injury to the cervical spine. Mild multilevel degenerative changes. Electronically Signed   By: Julian Hy M.D.   On: 07/14/2021 21:09   CT Maxillofacial Wo Contrast  Result Date: 07/14/2021 CLINICAL DATA:  Fall, bruising under left eye EXAM: CT HEAD WITHOUT CONTRAST CT MAXILLOFACIAL WITHOUT CONTRAST CT CERVICAL SPINE WITHOUT CONTRAST TECHNIQUE: Multidetector CT imaging of the head, cervical spine, and maxillofacial structures were performed using the standard protocol without intravenous contrast. Multiplanar CT image reconstructions of the cervical spine and maxillofacial structures were  also generated. RADIATION  DOSE REDUCTION: This exam was performed according to the departmental dose-optimization program which includes automated exposure control, adjustment of the mA and/or kV according to patient size and/or use of iterative reconstruction technique. COMPARISON:  None. FINDINGS: CT HEAD FINDINGS Brain: No evidence of acute infarction, hemorrhage, hydrocephalus, extra-axial collection or mass lesion/mass effect. Global cortical atrophy, likely age appropriate. Subcortical white matter and periventricular small vessel ischemic changes. Right basal ganglia/caudate lacunar infarcts. Vascular: Intracranial atherosclerosis. Skull: Normal. Negative for fracture or focal lesion. Other: None. CT MAXILLOFACIAL FINDINGS Osseous: No evidence of maxillofacial fracture. Mandible is intact. Bilateral mandibular condyles are well-seated in the TMJs. Orbits: Bilateral orbits, including the globes and retroconal soft tissues, within normal limits Sinuses: The visualized paranasal sinuses are essentially clear. The mastoid air cells are unopacified. Soft tissues: Mild soft tissue swelling overlying the left maxilla (series 2/image 29). CT CERVICAL SPINE FINDINGS Alignment: Normal cervical lordosis. Skull base and vertebrae: No acute fracture. No primary bone lesion or focal pathologic process. Soft tissues and spinal canal: No prevertebral fluid or swelling. No visible canal hematoma. Disc levels: Mild multilevel degenerative changes. Spinal canal is patent. Upper chest: Visualized lung apices are clear. Other: Visualized thyroid is grossly unremarkable. IMPRESSION: No evidence of acute intracranial abnormality. Atrophy with small vessel ischemic changes. Old right basal ganglia/caudate lacunar infarcts. No evidence of maxillofacial fracture. Mild soft tissue swelling overlying the left maxilla. No evidence of traumatic injury to the cervical spine. Mild multilevel degenerative changes. Electronically Signed   By:  Julian Hy M.D.   On: 07/14/2021 21:09    Assessment & Plan:   Problem List Items Addressed This Visit   None   I spent 30 minutes dedicated to the care of this patient on the date of this encounter to include pre-visit review of patient's medical history,  most recent imaging studies, Face-to-face time with the patient , and post visit ordering of testing and therapeutics.    Follow-up: No follow-ups on file.   Crecencio Mc, MD

## 2021-07-16 NOTE — Assessment & Plan Note (Signed)
Occurred unsupervised., resulting in facial trauma.  Bruised ribs . No fractures or SDH   She has chosen to continue warfarin doing forward despite her increased risk for SDH.

## 2021-07-17 ENCOUNTER — Ambulatory Visit: Payer: Medicare Other | Admitting: Pulmonary Disease

## 2021-07-21 ENCOUNTER — Other Ambulatory Visit: Payer: Self-pay

## 2021-07-21 ENCOUNTER — Encounter: Payer: Self-pay | Admitting: Internal Medicine

## 2021-07-21 ENCOUNTER — Ambulatory Visit
Admission: EM | Admit: 2021-07-21 | Discharge: 2021-07-21 | Disposition: A | Discharge status: Home / Self Care | Payer: Medicare Other | Attending: Emergency Medicine | Admitting: Emergency Medicine

## 2021-07-21 ENCOUNTER — Ambulatory Visit (INDEPENDENT_AMBULATORY_CARE_PROVIDER_SITE_OTHER): Payer: Medicare Other

## 2021-07-21 DIAGNOSIS — S2241XA Multiple fractures of ribs, right side, initial encounter for closed fracture: Secondary | ICD-10-CM

## 2021-07-21 DIAGNOSIS — J90 Pleural effusion, not elsewhere classified: Secondary | ICD-10-CM | POA: Diagnosis not present

## 2021-07-21 NOTE — Discharge Instructions (Addendum)
Use the tramadol that you are prescribed to help you with pain relief.  You may take it twice daily.  If you become constipated you can use MiraLAX to help resolve that.  You may also take over-the-counter Colace, 100 mg twice daily, to help prevent constipation.  Return for reevaluation for new or worsening symptoms.

## 2021-07-21 NOTE — ED Provider Notes (Signed)
[facility]    CSN: [id number] Arrival date & time: 07/21/21  1531      History   Chief Complaint Chief Complaint  Patient presents with   Fall    HPI Merridith Davonda Ausley is a 88 y.o. female.   HPI  88 year old female here for evaluation of chest pain.  Patient suffered a fall on 07/14/2021 and was evaluated in the emergency department at that time.  She had imaging of her head, face, and C-spine but no imaging of her chest.  Since that time she has been complaining of pain in her right chest underneath her breast that goes around the side to the back.  This is associated with some shortness of breath and the patient reports that the pain increases when she coughs, takes a deep breath, or moves.  She has been using topical lidocaine patches, Tylenol, and tramadol with some mild relief of pain.  Patient does have some bruising to her posterior chest wall in 2 different locations per the daughter but they are not in the areas where patient is having pain.  Past Medical History:  Diagnosis Date   Atrial fibrillation ([place]) 2012   Breast cancer ([place]) 04/2018   IDC of left breast    CHF (congestive heart failure) ([place])    Colon polyp 2003   Coronary artery disease    COVID-19 virus infection 12/25/2019   Diabetes mellitus without complication (HCC)    Dyspnea    Dysrhythmia    atrial fib   Environmental and seasonal allergies    GERD (gastroesophageal reflux disease)    Hyperlipidemia    Hypertension    Irritable bowel syndrome    Multiple rib fractures 03/15/2018   Personal history of radiation therapy    Polyp of colon    PONV (postoperative nausea and vomiting)    with ether, not recently "woke up in surgery once"   Presence of permanent cardiac pacemaker    Tremor    Tremors of nervous system    Ulcer     Patient Active Problem List   Diagnosis Date Noted   Shortness of breath 05/27/2021   Chest congestion 05/27/2021   Vitamin D deficiency  05/27/2021   Diabetes mellitus without complication ([place]) 38/33/3832   History of COVID-19 04/24/2021   Compression fracture of L2 lumbar vertebra (HCC)    QT prolongation    AF (paroxysmal atrial fibrillation) (HCC)    Hypokalemia    Back pain 03/10/2021   Hypertensive urgency 03/10/2021   Myalgia due to statin 09/15/2020   Acquired thrombophilia ([place]) 12/25/2019   Unintentional weight loss of 7.5% body weight or less within 3 months 12/25/2019   Closed rib fracture 09/08/2019   S/P placement of cardiac pacemaker 09/08/2019   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 09/08/2019   Pruritus 09/08/2019   Invasive ductal carcinoma of breast, female, left ([place]) 04/26/2018   Bilateral carotid artery stenosis 04/20/2018   Aortic atherosclerosis ([place]) 03/15/2018   Balance problem 03/15/2018   History of fall 03/15/2018   Mass of upper inner quadrant of left breast 03/14/2018   Other fatigue 10/27/2017   Intention tremor 04/15/2017   Osteoporosis 03/21/2016   Sick sinus syndrome ([place]) 01/28/2016   Other malaise 11/01/2015   Major depressive disorder, recurrent episode ([place]) 10/30/2015   Constipation 07/27/2015   Generalized anxiety disorder 04/02/2015   Ischemic chest pain ([place]) 01/08/2015   Chronic atrial fibrillation ([place]) 08/16/2014   Medicare annual wellness visit, subsequent 05/23/2014  Hyperlipidemia, mixed 05/08/2014   Moderate mitral insufficiency 05/08/2014   Moderate tricuspid insufficiency 05/08/2014   Dry mouth 03/10/2014   Low serum vitamin D 02/16/2014   Thrush 02/07/2014   LVH (left ventricular hypertrophy) 11/23/2013   Anal polyp 09/20/2013   Personal history of colonic polyps 08/31/2013   CAD (coronary artery disease) 03/09/2013   DM type 2 with diabetic peripheral neuropathy ([place]) 03/09/2013   Hyperlipidemia associated with type 2 diabetes mellitus ([place]) 03/09/2013   Essential hypertension, benign 03/09/2013   Nonulcer dyspepsia 03/09/2013    Past Surgical History:   Procedure Laterality Date   ABDOMINAL ADHESION SURGERY     ABDOMINAL HYSTERECTOMY     APPENDECTOMY     BREAST BIOPSY Right late 80s/early 90s   benign   BREAST BIOPSY Left 03/31/2018   [place] bx done with Dr. Bary Castilla, invasive ductal carcinoma   BREAST LUMPECTOMY Left 04/22/2018   Procedure: BREAST LUMPECTOMY;  Surgeon: Robert Bellow, MD;  Location: [facility] OR[facility];  Service: General;  Laterality: Left;   BREAST LUMPECTOMY Left 04/22/2018   [place], negative LN, clear margins   BREAST LUMPECTOMY Left 2020   positive, done in office   BREAST SURGERY Right    biopsy    CESAREAN SECTION     x 2   CHOLECYSTECTOMY  03/2012   Dr Burt Knack   COLONOSCOPY  2003   Dr Theodosia Paling in [place]   fibroid tumor     [place] N/A 01/28/2016   Procedure: INSERTION PACEMAKER;  Surgeon: Isaias Cowman, MD;  Location: [facility] OR[facility];  Service: Cardiovascular;  Laterality: N/A;   SENTINEL NODE BIOPSY Left 04/22/2018   Procedure: SENTINEL NODE BIOPSY;  Surgeon: Robert Bellow, MD;  Location: [facility];  Service: General;  Laterality: Left;   UPPER GI ENDOSCOPY  2003   Dr Theodosia Paling in [place]    OB History     Gravida  2   Para  2   Term      Preterm      AB      Living         SAB      IAB      Ectopic      Multiple      Live Births           Obstetric Comments  1st Menstrual Cycle:  16 1st Pregnancy:  30          Home Medications    Prior to Admission medications   Medication Sig Start Date End Date Taking? Authorizing Provider  acetaminophen (TYLENOL) 325 MG tablet Take 2 tablets (650 mg total) by mouth every 6 (six) hours as needed. 03/14/21   Loletha Grayer, MD  albuterol (PROAIR HFA) 108 (90 Base) MCG/ACT inhaler Inhale 1-2 puffs into the lungs every 4 (four) hours as needed for wheezing or shortness of breath. 11/17/17   Laverle Hobby, MD  amLODipine (NORVASC) 10 MG tablet Take 1 tablet (10 mg total) by mouth daily. 03/15/21   Loletha Grayer, MD  atenolol  (TENORMIN) 25 MG tablet Take 25 mg by mouth 2 (two) times daily. 07/01/21   [provider]  calcitonin, salmon, (MIACALCIN/FORTICAL) 200 UNIT/ACT nasal spray Place 1 spray into alternate nostrils daily. 03/15/21   Loletha Grayer, MD  Cholecalciferol (VITAMIN D3) 1000 UNITS CAPS Take 1,000 Units by mouth every morning.     [provider]  docusate sodium (COLACE) 100 MG capsule Take 2 capsules (200 mg total) by mouth 2 (two) times daily.  03/14/21   Wieting, Richard, MD  glucose blood (ONE TOUCH ULTRA TEST) test strip USE 1 STRIP TO CHECK GLUCOSE TWICE DAILY  Dx code: E11.9 05/29/19   Crecencio Mc, MD  glucose blood (ONE TOUCH ULTRA TEST) test strip USE ONE STRIP TO CHECK GLUCOSE ONCE  DAILY 06/02/19   Crecencio Mc, MD  ipratropium (ATROVENT) 0.03 % nasal spray Place 2 sprays into both nostrils every 12 (twelve) hours. 02/23/20   Crecencio Mc, MD  isosorbide mononitrate (IMDUR) 60 MG 24 hr tablet Take 60 mg by mouth 2 (two) times daily. 12/27/15   [provider]  Lancets (ONETOUCH DELICA PLUS [id number]) [place] 2 Devices into the skin daily. DX Code E11.9. 01/30/19   Crecencio Mc, MD  lisinopril (ZESTRIL) 20 MG tablet Take 2 tablets by mouth twice daily 06/30/21   Crecencio Mc, MD  meclizine (ANTIVERT) 25 MG tablet Take 1 tablet by mouth three times daily as needed 06/24/20   Crecencio Mc, MD  mometasone (ELOCON) 0.1 % cream Apply once ot twice daily as needed for itching 09/13/20   Crecencio Mc, MD  nitroGLYCERIN (NITROSTAT) 0.4 MG SL tablet Place 0.4 mg under the tongue every 5 (five) minutes as needed for chest pain.    [provider]  omeprazole (PRILOSEC) 20 MG capsule Take 1 capsule by mouth once daily 07/14/21   Crecencio Mc, MD  [facility] ULTRA test strip USE 1 STRIP TO CHECK GLUCOSE TWICE DAILY 05/15/19   Crecencio Mc, MD  Polyethyl Glycol-Propyl Glycol 0.4-0.3 % SOLN Place 1 drop into both eyes 3 (three) times daily as needed (for  dry/irritated eyes.).    [provider]  polyethylene glycol (MIRALAX / GLYCOLAX) 17 g packet Take 17 g by mouth daily as needed.    [provider]  psyllium (METAMUCIL) 58.6 % packet Take 1 packet by mouth daily as needed (for regularity).     [provider]  triamcinolone cream (KENALOG) 0.1 % APPLY 1 APPLICATION TOPICALLY TWICE DAILY 04/18/20   Crecencio Mc, MD  warfarin (COUMADIN) 2 MG tablet Take 2 mg by mouth 2 (two) times a week. Take 2mg  at bedtime on Wednesday & Sunday. 01/29/20   [provider]  warfarin (COUMADIN) 4 MG tablet Take 4 mg by mouth daily at 6 PM. Does not take Wednesday or Sunday.    [provider]    Family History Family History  Problem Relation Age of Onset   Breast cancer Sister    Cancer Sister 12       breast   Breast cancer Maternal Aunt 68   Breast cancer Cousin    Breast cancer Other 10    Social History Social History   Tobacco Use   Smoking status: Never   Smokeless tobacco: Former    Types: Snuff   Tobacco comments:    occasionally  Vaping Use   Vaping Use: Never used  Substance Use Topics   Alcohol use: No   Drug use: No     Allergies   Codeine, Penicillins, Amlodipine, Atorvastatin, Clonidine derivatives, Gabapentin, Losartan, Morphine, Morphine and related, Reglan [metoclopramide], and Hydralazine   Review of Systems Review of Systems  Respiratory:  Positive for shortness of breath.   Cardiovascular:  Positive for chest pain. Negative for palpitations.  Skin:  Positive for color change.  Hematological: Negative.   Psychiatric/Behavioral: Negative.      Physical Exam Triage Vital Signs ED Triage Vitals  Enc Vitals Group     BP 07/21/21 1545 (S) (!) 170/96     Pulse Rate 07/21/21 1545 60     Resp 07/21/21 1545 16     Temp 07/21/21 1545 97.8 F (36.6 C)     Temp Source 07/21/21 1545 Oral     SpO2 07/21/21 1545 95 %     Weight --      Height --      Head Circumference  --      Peak Flow --      Pain Score 07/21/21 1542 8     Pain Loc --      Pain Edu? --      Excl. in [place]? --    No data found.  Updated Vital Signs BP (S) (!) 170/96 (BP Location: Left Arm)    Pulse 60    Temp 97.8 F (36.6 C) (Oral)    Resp 16    SpO2 95%   Visual Acuity Right Eye Distance:   Left Eye Distance:   Bilateral Distance:    Right Eye Near:   Left Eye Near:    Bilateral Near:     Physical Exam Vitals and nursing note reviewed.  Constitutional:      General: She is not in acute distress.    Appearance: Normal appearance. She is not ill-appearing.  HENT:     Head: Normocephalic and atraumatic.  Cardiovascular:     Rate and Rhythm: Normal rate and regular rhythm.     Pulses: Normal pulses.     Heart sounds: Normal heart sounds. No murmur heard.   No friction rub. No gallop.  Pulmonary:     Effort: Pulmonary effort is normal.     Breath sounds: No wheezing, rhonchi or rales.  Chest:     Chest wall: Tenderness present.  Skin:    General: Skin is warm and dry.     Capillary Refill: Capillary refill takes less than 2 seconds.     Findings: Bruising present.  Neurological:     General: No focal deficit present.     Mental Status: She is alert and oriented to person, place, and time.  Psychiatric:        Mood and Affect: Mood normal.        Behavior: Behavior normal.        Thought Content: Thought content normal.        Judgment: Judgment normal.     UC Treatments / Results  Labs (all labs ordered are listed, but only abnormal results are displayed) Labs Reviewed - No data to display  EKG   Radiology DG Ribs Unilateral W/Chest Right  Result Date: 07/21/2021 CLINICAL DATA:  Right rib pain after fall. EXAM: RIGHT RIBS AND CHEST - 3+ VIEW COMPARISON:  June 28, 2021. FINDINGS: Minimally displaced right fifth and sixth rib fractures are noted. There is no evidence of pneumothorax. Minimal right pleural effusion is noted. Both lungs are clear. Heart  size and mediastinal contours are within normal limits. IMPRESSION: Minimally displaced right fifth and sixth rib fractures. Minimal right pleural effusion. Electronically Signed   By: Marijo Conception M.D.   On: 07/21/2021 16:21    Procedures Procedures (including critical care time)  Medications Ordered in UC Medications - No data to display  Initial Impression / Assessment and Plan / UC Course  I have reviewed the triage vital signs and the nursing notes.  Pertinent labs & imaging results that were available during my care  of the patient were reviewed by me and considered in my medical decision making (see chart for details).  Patient is a very pleasant 88 year old female here for evaluation of right-sided chest wall pain that has been ongoing for the past 7 days since she suffered a fall.  The pain increases with movement, coughing, sneezing, or deep breathing.  She has been using tramadol, lidocaine patches, and Tylenol at home with minimal relief of pain symptoms.  Patient is able to speak in full sentences and relate the history of the fall.  On exam patient has a benign cardiopulmonary exam with S1-S2 heart sounds and lung sounds that are clear to auscultation all fields.  There is bruising in 2 separate locations that is yellow in color on the posterior aspect of the right chest wall.  This is above the area where the patient is complaining of pain.  Patient reports she has significant pain behind her right breast that extends laterally through the axillary region to the posterior lower rib cage.  The patient is tender along the pathway of the seventh and eighth rib but there is no crepitus felt on exam.  We will obtain right rib films to look for the presence of a broken rib.  Radiology impression is minimally displaced right fifth and sixth rib fractures and minimal right pleural effusion.  We will discharge patient home and have her her tramadol as needed for pain.  She is written for it  twice daily but she is reluctant to take it due to causing constipation.  I advised her that she can take over-the-counter Colace twice daily to help prevent constipation and her daughter also remind her that she has MiraLAX at home which she can use if she comes constipated as well.   Final Clinical Impressions(s) / UC Diagnoses   Final diagnoses:  Closed fracture of multiple ribs of right side, initial encounter     Discharge Instructions      Use the tramadol that you are prescribed to help you with pain relief.  You may take it twice daily.  If you become constipated you can use MiraLAX to help resolve that.  You may also take over-the-counter Colace, 100 mg twice daily, to help prevent constipation.  Return for reevaluation for new or worsening symptoms.     ED Prescriptions   None    PDMP not reviewed this encounter.   Margarette Canada, NP 07/21/21 1630

## 2021-07-21 NOTE — ED Triage Notes (Signed)
Patient presents to Urgent Care with complaints of right sided chest pain since 01/30. Pain has worsened. Treating pain with tylenol, lidocaine patch, and one dose of tramadol yesterday. Pt worse with cough, sneezing.

## 2021-07-22 ENCOUNTER — Other Ambulatory Visit: Payer: Self-pay | Admitting: Internal Medicine

## 2021-07-22 DIAGNOSIS — R0789 Other chest pain: Secondary | ICD-10-CM

## 2021-07-29 ENCOUNTER — Telehealth: Payer: Self-pay | Admitting: Internal Medicine

## 2021-07-29 DIAGNOSIS — E871 Hypo-osmolality and hyponatremia: Secondary | ICD-10-CM

## 2021-07-29 MED ORDER — SPIRONOLACTONE 25 MG PO TABS: 25.0000 mg | ORAL_TABLET | Freq: Every day | ORAL | 1 refills | Status: DC

## 2021-07-29 NOTE — Telephone Encounter (Signed)
Spoke with pt's daughter and let her know that the Spironolactone has been sent in. Also scheduled pt for a lab appt in two weeks to have bmet checked.

## 2021-07-29 NOTE — Telephone Encounter (Signed)
Spironolactone sent.  25 mg daily  needs BMET after 1-2 weeks of concurrent use with lisinopril to check potassium level .  I will order lab.

## 2021-07-29 NOTE — Telephone Encounter (Signed)
Patient's daughter called to let Dr Derrel Nip know that patient's  Bp is going up again and she will need a water pill.

## 2021-07-29 NOTE — Telephone Encounter (Signed)
Spoke with pt's daughter and she stated that since the HCTZ was stopped the pt's blood pressure has continued to go up ranging in the 180s/80s-90s. [name] did stated that the pt has been in pain with her ribs since she fell and fractured them but at last appt she was advised that if it continues to go up to call and Spironolactone would be sent in.

## 2021-08-07 ENCOUNTER — Ambulatory Visit: Payer: Medicare Other | Admitting: Internal Medicine

## 2021-08-08 DIAGNOSIS — Z20822 Contact with and (suspected) exposure to covid-19: Secondary | ICD-10-CM | POA: Diagnosis not present

## 2021-08-13 ENCOUNTER — Other Ambulatory Visit: Payer: Medicare Other

## 2021-08-13 ENCOUNTER — Other Ambulatory Visit: Payer: Self-pay | Admitting: Internal Medicine

## 2021-08-18 DIAGNOSIS — Z7901 Long term (current) use of anticoagulants: Secondary | ICD-10-CM | POA: Diagnosis not present

## 2021-08-19 ENCOUNTER — Other Ambulatory Visit (INDEPENDENT_AMBULATORY_CARE_PROVIDER_SITE_OTHER): Payer: Medicare Other

## 2021-08-19 ENCOUNTER — Other Ambulatory Visit: Payer: Self-pay

## 2021-08-19 DIAGNOSIS — E871 Hypo-osmolality and hyponatremia: Secondary | ICD-10-CM

## 2021-08-19 LAB — BASIC METABOLIC PANEL
BUN: 10 mg/dL (ref 6–23)
CO2: 32 mEq/L (ref 19–32)
Calcium: 9.7 mg/dL (ref 8.4–10.5)
Chloride: 98 mEq/L (ref 96–112)
Creatinine, Ser: 0.56 mg/dL (ref 0.40–1.20)
GFR: 78.92 mL/min (ref >60.00)
Glucose, Bld: 221 mg/dL — ABNORMAL HIGH (ref 70–99)
Potassium: 4.5 mEq/L (ref 3.5–5.1)
Sodium: 135 mEq/L (ref 135–145)

## 2021-08-20 ENCOUNTER — Other Ambulatory Visit: Payer: Self-pay | Admitting: Internal Medicine

## 2021-08-21 ENCOUNTER — Encounter: Payer: Self-pay | Admitting: Internal Medicine

## 2021-08-25 ENCOUNTER — Ambulatory Visit: Payer: Medicare Other | Admitting: Pulmonary Disease

## 2021-08-25 ENCOUNTER — Other Ambulatory Visit: Payer: Self-pay | Admitting: Internal Medicine

## 2021-08-27 ENCOUNTER — Other Ambulatory Visit: Payer: Self-pay

## 2021-08-27 ENCOUNTER — Ambulatory Visit
Admission: EM | Admit: 2021-08-27 | Discharge: 2021-08-27 | Disposition: A | Discharge status: Home / Self Care | Payer: Medicare Other | Attending: Emergency Medicine | Admitting: Emergency Medicine

## 2021-08-27 DIAGNOSIS — R7309 Other abnormal glucose: Secondary | ICD-10-CM | POA: Diagnosis not present

## 2021-08-27 DIAGNOSIS — R300 Dysuria: Secondary | ICD-10-CM | POA: Insufficient documentation

## 2021-08-27 DIAGNOSIS — R81 Glycosuria: Secondary | ICD-10-CM

## 2021-08-27 DIAGNOSIS — R350 Frequency of micturition: Secondary | ICD-10-CM | POA: Diagnosis not present

## 2021-08-27 LAB — URINALYSIS, ROUTINE W REFLEX MICROSCOPIC
Bilirubin Urine: NEGATIVE
Glucose, UA: >1000 mg/dL — AB
Hgb urine dipstick: NEGATIVE
Ketones, ur: NEGATIVE mg/dL
Leukocytes,Ua: NEGATIVE
Nitrite: NEGATIVE
Protein, ur: NEGATIVE mg/dL
Specific Gravity, Urine: >1.03 — ABNORMAL HIGH (ref 1.005–1.030)
pH: 5 (ref 5.0–8.0)

## 2021-08-27 LAB — GLUCOSE, CAPILLARY: Glucose-Capillary: 168 mg/dL — ABNORMAL HIGH (ref 70–99)

## 2021-08-27 MED ORDER — MICONAZOLE NITRATE 2 % EX CREA: 1.0000 "application " | TOPICAL_CREAM | Freq: Two times a day (BID) | CUTANEOUS | 0 refills | Status: DC

## 2021-08-27 NOTE — ED Notes (Signed)
BG 168 ?

## 2021-08-27 NOTE — ED Provider Notes (Signed)
HPI ? ?SUBJECTIVE: ? ?Leah Holmes is a 88 y.o. female who presents with 2 weeks of dysuria, frequency, or urgency.  She states that her urine is "real yellow".  No odorous urine, hematuria, nausea, vomiting, fevers, new or different abdominal pain, back or pelvic pain, vaginal itching, bleeding, odor, discharge.  No unintentional weight loss, increased thirst.  She drinks 32 to 48 ounces of water a day.  No recent antibiotics.  No antipyretic in the past 6 hours.  She does not check her sugar at home per PCP approval.  She has tried Vagisil cream with improvement in her symptoms.  She has also tried Tylenol and cranberry juice.  The increase fluids helps.  No aggravating factors.  She has a past medical history of diabetes and is currently not on any medications for it.  She has a history of hypertension, took her blood pressure medications this morning, atrial fibrillation on warfarin, remote history of UTI, CHF, coronary artery disease, history of breast cancer, sick sinus syndrome status post pacemaker placement, prolonged QT syndrome.  No history of chronic kidney disease, pyelonephritis, nephrolithiasis, BV, yeast, atrophic vaginitis.  She has an appointment with her PCP next week ? ?Additional history obtained from daughter. ? ?Past Medical History:  ?Diagnosis Date  ? Atrial fibrillation ([place]) 2012  ? Breast cancer ([place]) 04/2018  ? IDC of left breast   ? CHF (congestive heart failure) ([place])   ? Colon polyp 2003  ? Coronary artery disease   ? COVID-19 virus infection 12/25/2019  ? Diabetes mellitus without complication ([employer])   ? Dyspnea   ? Dysrhythmia   ? atrial fib  ? Environmental and seasonal allergies   ? GERD (gastroesophageal reflux disease)   ? Hyperlipidemia   ? Hypertension   ? Irritable bowel syndrome   ? Multiple rib fractures 03/15/2018  ? Personal history of radiation therapy   ? Polyp of colon   ? PONV (postoperative nausea and vomiting)   ? with ether, not recently "woke up in  surgery once"  ? Presence of permanent cardiac pacemaker   ? Tremor   ? Tremors of nervous system   ? Ulcer   ? ? ?Past Surgical History:  ?Procedure Laterality Date  ? ABDOMINAL ADHESION SURGERY    ? ABDOMINAL HYSTERECTOMY    ? APPENDECTOMY    ? BREAST BIOPSY Right late 80s/early 90s  ? benign  ? BREAST BIOPSY Left 03/31/2018  ? [place] bx done with Dr. Bary Castilla, invasive ductal carcinoma  ? BREAST LUMPECTOMY Left 04/22/2018  ? Procedure: BREAST LUMPECTOMY;  Surgeon: Robert Bellow, MD;  Location: [facility] OR[facility];  Service: General;  Laterality: Left;  ? BREAST LUMPECTOMY Left 04/22/2018  ? [facility], negative LN, clear margins  ? BREAST LUMPECTOMY Left 2020  ? positive, done in office  ? BREAST SURGERY Right   ? biopsy   ? CESAREAN SECTION    ? x 2  ? CHOLECYSTECTOMY  03/2012  ? Dr Burt Knack  ? COLONOSCOPY  2003  ? Dr Theodosia Paling in [place]  ? fibroid tumor    ? PACEMAKER INSERTION N/A 01/28/2016  ? Procedure: INSERTION PACEMAKER;  Surgeon: Isaias Cowman, MD;  Location: [facility] OR[facility];  Service: Cardiovascular;  Laterality: N/A;  ? SENTINEL NODE BIOPSY Left 04/22/2018  ? Procedure: SENTINEL NODE BIOPSY;  Surgeon: Robert Bellow, MD;  Location: [facility];  Service: General;  Laterality: Left;  ? UPPER GI ENDOSCOPY  2003  ? Dr Theodosia Paling in [place]  ? ? ?Family History  ?  Problem Relation Age of Onset  ? Breast cancer Sister   ? Cancer Sister 23  ?     breast  ? Breast cancer Maternal Aunt 53  ? Breast cancer Cousin   ? Breast cancer Other 54  ? ? ?Social History  ? ?Tobacco Use  ? Smoking status: Never  ? Smokeless tobacco: Former  ?  Types: Snuff  ? Tobacco comments:  ?  occasionally  ?Vaping Use  ? Vaping Use: Never used  ?Substance Use Topics  ? Alcohol use: No  ? Drug use: No  ? ? ?No current facility-administered medications for this encounter. ? ?Current Outpatient Medications:  ?  miconazole (MICOTIN) 2 % cream, Apply 1 application. topically 2 (two) times daily., Disp: 28.35 g, Rfl: 0 ?  acetaminophen (TYLENOL) 325 MG tablet,  Take 2 tablets (650 mg total) by mouth every 6 (six) hours as needed., Disp: , Rfl:  ?  albuterol (PROAIR HFA) 108 (90 Base) MCG/ACT inhaler, Inhale 1-2 puffs into the lungs every 4 (four) hours as needed for wheezing or shortness of breath., Disp: 1 Inhaler, Rfl: 5 ?  amLODipine (NORVASC) 10 MG tablet, Take 1 tablet (10 mg total) by mouth daily., Disp: 30 tablet, Rfl: 0 ?  atenolol (TENORMIN) 25 MG tablet, Take 25 mg by mouth 2 (two) times daily., Disp: , Rfl:  ?  calcitonin, salmon, (MIACALCIN/FORTICAL) 200 UNIT/ACT nasal spray, Place 1 spray into alternate nostrils daily., Disp: 3.7 mL, Rfl: 0 ?  Cholecalciferol (VITAMIN D3) 1000 UNITS CAPS, Take 1,000 Units by mouth every morning. , Disp: , Rfl:  ?  docusate sodium (COLACE) 100 MG capsule, Take 2 capsules (200 mg total) by mouth 2 (two) times daily., Disp: 60 capsule, Rfl: 0 ?  glucose blood (ONE TOUCH ULTRA TEST) test strip, USE 1 STRIP TO CHECK GLUCOSE TWICE DAILY  Dx code: E11.9, Disp: 200 each, Rfl: 3 ?  glucose blood (ONE TOUCH ULTRA TEST) test strip, USE ONE STRIP TO CHECK GLUCOSE ONCE  DAILY, Disp: 100 each, Rfl: 3 ?  ipratropium (ATROVENT) 0.03 % nasal spray, Place 2 sprays into both nostrils every 12 (twelve) hours., Disp: 30 mL, Rfl: 12 ?  isosorbide mononitrate (IMDUR) 60 MG 24 hr tablet, Take 60 mg by mouth 2 (two) times daily., Disp: , Rfl:  ?  Lancets (ONETOUCH DELICA PLUS [id number]) MISC, Inject 2 Devices into the skin daily. DX Code E11.9., Disp: 200 each, Rfl: 2 ?  lisinopril (ZESTRIL) 20 MG tablet, Take 2 tablets by mouth twice daily, Disp: 180 tablet, Rfl: 0 ?  meclizine (ANTIVERT) 25 MG tablet, Take 1 tablet by mouth three times daily as needed, Disp: 40 tablet, Rfl: 0 ?  mometasone (ELOCON) 0.1 % cream, Apply once ot twice daily as needed for itching, Disp: 15 g, Rfl: 1 ?  nitroGLYCERIN (NITROSTAT) 0.4 MG SL tablet, Place 0.4 mg under the tongue every 5 (five) minutes as needed for chest pain., Disp: , Rfl:  ?  omeprazole (PRILOSEC) 20 MG  capsule, Take 1 capsule by mouth once daily, Disp: 90 capsule, Rfl: 0 ?  ONETOUCH ULTRA test strip, USE 1 STRIP TO CHECK GLUCOSE TWICE DAILY, Disp: 100 each, Rfl: 0 ?  Polyethyl Glycol-Propyl Glycol 0.4-0.3 % SOLN, Place 1 drop into both eyes 3 (three) times daily as needed (for dry/irritated eyes.)., Disp: , Rfl:  ?  polyethylene glycol (MIRALAX / GLYCOLAX) 17 g packet, Take 17 g by mouth daily as needed., Disp: , Rfl:  ?  psyllium (METAMUCIL) 58.6 %  packet, Take 1 packet by mouth daily as needed (for regularity). , Disp: , Rfl:  ?  spironolactone (ALDACTONE) 25 MG tablet, Take 1 tablet (25 mg total) by mouth daily., Disp: 90 tablet, Rfl: 1 ?  triamcinolone cream (KENALOG) 0.1 %, APPLY 1 APPLICATION TOPICALLY TWICE DAILY, Disp: 30 g, Rfl: 0 ?  warfarin (COUMADIN) 2 MG tablet, Take 2 mg by mouth 2 (two) times a week. Take '2mg'$  at bedtime on Wednesday & Sunday., Disp: , Rfl:  ?  warfarin (COUMADIN) 4 MG tablet, Take 4 mg by mouth daily at 6 PM. Does not take Wednesday or Sunday., Disp: , Rfl:  ? ?Allergies  ?Allergen Reactions  ? Codeine Anaphylaxis  ? Penicillins Anaphylaxis  ?  Has patient had a PCN reaction causing immediate rash, facial/tongue/throat swelling, SOB or lightheadedness with hypotension: Yes ?Has patient had a PCN reaction causing severe rash involving mucus membranes or skin necrosis: No ?Has patient had a PCN reaction that required hospitalization Yes ?Has patient had a PCN reaction occurring within the last 10 years: No ?If all of the above answers are "NO", then may proceed with Cephalosporin use.  ? Amlodipine Other (See Comments)  ?  Diffuse itching   ? Atorvastatin   ?  Other reaction(s): Unknown  ? Clonidine Derivatives Other (See Comments)  ?  Reaction:  Unknown   ? Gabapentin Nausea Only  ? Losartan Other (See Comments)  ?  Other reaction(s): Other (See Comments) ?Reaction:  Muscle aches  ?Reaction:  Muscle aches   ? Morphine   ?  Other reaction(s): Unknown  ? Morphine And Related Other (See  Comments)  ?  Reaction:  Unknown   ? Reglan [Metoclopramide] Other (See Comments)  ?  Reaction:  Tremors   ? Valtrex [Valacyclovir] Other (See Comments)  ?  Tremors   ? Hydralazine Anxiety  ? ? ? ?ROS ? ?As no

## 2021-08-27 NOTE — ED Triage Notes (Signed)
Patient presents to Urgent Care with complaints of dysuria x 2 weeks. Daughter states she has been taking cranberry juice for symptom relief.  ?

## 2021-08-27 NOTE — Discharge Instructions (Addendum)
We can try treating this like yeast infection with some external miconazole.  It should also help with atrophic vaginitis.  Her glucose was 168 today, which is not extraordinarily high, however, Dr. Derrel Nip may want to restart the medication since you have so much glucose in your urine.  Start drinking more water. ?

## 2021-09-01 ENCOUNTER — Other Ambulatory Visit: Payer: Self-pay | Admitting: Internal Medicine

## 2021-09-01 ENCOUNTER — Ambulatory Visit (INDEPENDENT_AMBULATORY_CARE_PROVIDER_SITE_OTHER): Payer: Medicare Other | Admitting: Internal Medicine

## 2021-09-01 ENCOUNTER — Telehealth: Payer: Self-pay | Admitting: Internal Medicine

## 2021-09-01 ENCOUNTER — Ambulatory Visit: Payer: Medicare Other | Admitting: Internal Medicine

## 2021-09-01 ENCOUNTER — Other Ambulatory Visit: Payer: Self-pay

## 2021-09-01 ENCOUNTER — Encounter: Payer: Self-pay | Admitting: Internal Medicine

## 2021-09-01 VITALS — BP 142/62 | HR 65 | Temp 97.7°F | Ht 66.0 in | Wt 123.4 lb

## 2021-09-01 DIAGNOSIS — R81 Glycosuria: Secondary | ICD-10-CM | POA: Insufficient documentation

## 2021-09-01 DIAGNOSIS — I482 Chronic atrial fibrillation, unspecified: Secondary | ICD-10-CM | POA: Diagnosis not present

## 2021-09-01 DIAGNOSIS — D6869 Other thrombophilia: Secondary | ICD-10-CM

## 2021-09-01 DIAGNOSIS — E1142 Type 2 diabetes mellitus with diabetic polyneuropathy: Secondary | ICD-10-CM

## 2021-09-01 DIAGNOSIS — E119 Type 2 diabetes mellitus without complications: Secondary | ICD-10-CM | POA: Diagnosis not present

## 2021-09-01 LAB — POCT GLYCOSYLATED HEMOGLOBIN (HGB A1C): Hemoglobin A1C: 7.2 % — AB (ref 4.0–5.6)

## 2021-09-01 LAB — GLUCOSE, POCT (MANUAL RESULT ENTRY): POC Glucose: 200 mg/dl — AB (ref 70–99)

## 2021-09-01 MED ORDER — AMLODIPINE BESYLATE 10 MG PO TABS: 10.0000 mg | ORAL_TABLET | Freq: Every day | ORAL | 0 refills | Status: DC

## 2021-09-01 MED ORDER — ONETOUCH DELICA PLUS LANCET33G MISC: 2.0000 | Freq: Every day | 2 refills | Status: AC

## 2021-09-01 MED ORDER — ACETONE (URINE) TEST VI STRP: 1.0000 | ORAL_STRIP | 0 refills | Status: DC | PRN

## 2021-09-01 MED ORDER — GLUCOSE BLOOD VI STRP: ORAL_STRIP | 12 refills | Status: DC

## 2021-09-01 NOTE — Progress Notes (Signed)
? ?Subjective:  ?Patient ID: Leah Holmes, female    DOB: [birth year]  Age: 88 y.o. MRN: [id number] ? ?CC: The primary encounter diagnosis was Diabetes mellitus without complication ([place]). Diagnoses of Glucosuria, DM type 2 with diabetic peripheral neuropathy ([place]), Chronic atrial fibrillation ([place]), and Acquired thrombophilia ([place]) were also pertinent to this visit. ? ? ?This visit occurred during the SARS-CoV-2 public health emergency.  Safety protocols were in place, including screening questions prior to the visit, additional usage of staff PPE, and extensive cleaning of exam room while observing appropriate contact time as indicated for disinfecting solutions.   ? ?HPI ?Leah Holmes presents for follow up on recent urgent care visit  ? ?88 yr old female with type 2 DM,  atrophic vaginitis evaluated and treated on March 15  by Urgent Care for urinary frequency and urgency without burning .  No UTI found.  Significant glucosuria (> 1000) and concentrated urine was noted.    ? ?Per evaluation: "Difficult to tell where this was coming from, could be due to glucosuria,  yeast infection or atrophic vaginitis.  She has no evidence of a UTI, is not significantly hyperglycemic.  We will try a course of external miconazole and follow-up with her primary care provider if this does not help for treatment of possible atrophic vaginitis.  No intravaginal miconazole because it interacts with warfarin and no Diflucan because it prolongs the QT interval" ? ?T2DM:  She has not been taking medication or checking sugars per my advice as of one year ago (due to frequent checking and worrying).  She currently does not have a functioning glucometer .  Diet reviewed. Today's CBG  of 200 was drawn one hour post a breakfast of  1/2 banana and sips of boost.  ?  ?Hypertension: patient checks blood pressure twice weekly at home.  Readings have been reportedly high,  up to  > 160/80 at rest .  She resumed the hctz 12.5  mg   bc she did not tolerate higher dose due to frequency,  and readings have improved to 140/70   .  Did not tolerate  spironolactone due to itching . She is following a low salt diet and is taking medications as prescribed (amlodipine, atenolol, lisinopril, and Imdur )  ? ?Outpatient Medications Prior to Visit  ?Medication Sig Dispense Refill  ? acetaminophen (TYLENOL) 325 MG tablet Take 2 tablets (650 mg total) by mouth every 6 (six) hours as needed.    ? albuterol (PROAIR HFA) 108 (90 Base) MCG/ACT inhaler Inhale 1-2 puffs into the lungs every 4 (four) hours as needed for wheezing or shortness of breath. 1 Inhaler 5  ? amLODipine (NORVASC) 10 MG tablet Take 1 tablet (10 mg total) by mouth daily. 30 tablet 0  ? atenolol (TENORMIN) 25 MG tablet Take 25 mg by mouth 2 (two) times daily.    ? calcitonin, salmon, (MIACALCIN/FORTICAL) 200 UNIT/ACT nasal spray Place 1 spray into alternate nostrils daily. 3.7 mL 0  ? Cholecalciferol (VITAMIN D3) 1000 UNITS CAPS Take 1,000 Units by mouth every morning.     ? docusate sodium (COLACE) 100 MG capsule Take 2 capsules (200 mg total) by mouth 2 (two) times daily. 60 capsule 0  ? glucose blood (ONE TOUCH ULTRA TEST) test strip USE 1 STRIP TO CHECK GLUCOSE TWICE DAILY  Dx code: E11.9 200 each 3  ? glucose blood (ONE TOUCH ULTRA TEST) test strip USE ONE STRIP TO CHECK GLUCOSE ONCE  DAILY 100 each 3  ?  hydrochlorothiazide (HYDRODIURIL) 25 MG tablet Take 12.5 mg by mouth daily.    ? ipratropium (ATROVENT) 0.03 % nasal spray Place 2 sprays into both nostrils every 12 (twelve) hours. 30 mL 12  ? isosorbide mononitrate (IMDUR) 60 MG 24 hr tablet Take 60 mg by mouth 2 (two) times daily.    ? lisinopril (ZESTRIL) 20 MG tablet Take 2 tablets by mouth twice daily 180 tablet 0  ? meclizine (ANTIVERT) 25 MG tablet Take 1 tablet by mouth three times daily as needed 40 tablet 0  ? miconazole (MICOTIN) 2 % cream Apply 1 application. topically 2 (two) times daily. 28.35 g 0  ? mometasone  (ELOCON) 0.1 % cream Apply once ot twice daily as needed for itching 15 g 1  ? nitroGLYCERIN (NITROSTAT) 0.4 MG SL tablet Place 0.4 mg under the tongue every 5 (five) minutes as needed for chest pain.    ? omeprazole (PRILOSEC) 20 MG capsule Take 1 capsule by mouth once daily 90 capsule 0  ? ONETOUCH ULTRA test strip USE 1 STRIP TO CHECK GLUCOSE TWICE DAILY 100 each 0  ? Polyethyl Glycol-Propyl Glycol 0.4-0.3 % SOLN Place 1 drop into both eyes 3 (three) times daily as needed (for dry/irritated eyes.).    ? polyethylene glycol (MIRALAX / GLYCOLAX) 17 g packet Take 17 g by mouth daily as needed.    ? psyllium (METAMUCIL) 58.6 % packet Take 1 packet by mouth daily as needed (for regularity).     ? triamcinolone cream (KENALOG) 0.1 % APPLY 1 APPLICATION TOPICALLY TWICE DAILY 30 g 0  ? warfarin (COUMADIN) 2 MG tablet Take 2 mg by mouth 2 (two) times a week. Take '2mg'$  at bedtime on Wednesday & Sunday.    ? warfarin (COUMADIN) 4 MG tablet Take 4 mg by mouth daily at 6 PM. Does not take Wednesday or Sunday.    ? Lancets (ONETOUCH DELICA PLUS [id number]) MISC Inject 2 Devices into the skin daily. DX Code E11.9. 200 each 2  ? spironolactone (ALDACTONE) 25 MG tablet Take 1 tablet (25 mg total) by mouth daily. (Patient not taking: Reported on 09/01/2021) 90 tablet 1  ? ?No facility-administered medications prior to visit.  ? ? ?Review of Systems; ? ?Patient denies headache, fevers, malaise, unintentional weight loss, skin rash, eye pain, sinus congestion and sinus pain, sore throat, dysphagia,  hemoptysis , cough, dyspnea, wheezing, chest pain, palpitations, orthopnea, edema, abdominal pain, nausea, melena, diarrhea, constipation, flank pain, dysuria, hematuria, urinary  Frequency, nocturia, numbness, tingling, seizures,  Focal weakness, Loss of consciousness,  Tremor, insomnia, depression, anxiety, and suicidal ideation.   ? ? ? ?Objective:  ?BP (!) 142/62 (BP Location: Left Arm, Patient Position: Sitting, Cuff Size: Large)    Pulse 65   Temp 97.7 ?F (36.5 ?C) (Oral)   Ht '5\' 6"'$  (1.676 m)   Wt 123 lb 6.4 oz (56 kg)   SpO2 98%   BMI 19.92 kg/m?  ? ?BP Readings from Last 3 Encounters:  ?09/01/21 (!) 142/62  ?08/27/21 (S) (!) 184/74  ?07/21/21 (S) (!) 170/96  ? ? ?Wt Readings from Last 3 Encounters:  ?09/01/21 123 lb 6.4 oz (56 kg)  ?07/16/21 123 lb 12.8 oz (56.2 kg)  ?07/14/21 125 lb (56.7 kg)  ? ? ?General appearance: alert, cooperative and appears stated age ?Ears: normal TM's and external ear canals both ears ?Throat: lips, mucosa, and tongue normal; teeth and gums normal ?Neck: no adenopathy, no carotid bruit, supple, symmetrical, trachea midline and thyroid not enlarged, symmetric,  no tenderness/mass/nodules ?Back: symmetric, no curvature. ROM normal. No CVA tenderness. ?Lungs: clear to auscultation bilaterally ?Heart: regular rate and rhythm, S1, S2 normal, no murmur, click, rub or gallop ?Abdomen: soft, non-tender; bowel sounds normal; no masses,  no organomegaly ?Pulses: 2+ and symmetric ?Skin: Skin color, texture, turgor normal. No rashes or lesions ?Lymph nodes: Cervical, supraclavicular, and axillary nodes normal. ? ?Lab Results  ?Component Value Date  ? HGBA1C 7.2 (A) 09/01/2021  ? HGBA1C 7.5 (H) 05/26/2021  ? HGBA1C 6.7 (H) 09/13/2020  ? ? ?Lab Results  ?Component Value Date  ? CREATININE 0.56 08/19/2021  ? CREATININE 0.67 07/14/2021  ? CREATININE 0.49 05/26/2021  ? ? ?Lab Results  ?Component Value Date  ? WBC 9.3 07/14/2021  ? HGB 15.1 (H) 07/14/2021  ? HCT 45.7 07/14/2021  ? PLT 220 07/14/2021  ? GLUCOSE 221 (H) 08/19/2021  ? CHOL 174 09/13/2020  ? TRIG 63.0 09/13/2020  ? HDL 60.10 09/13/2020  ? LDLDIRECT 116.0 10/30/2015  ? LDLCALC 101 (H) 09/13/2020  ? ALT 14 05/26/2021  ? AST 17 05/26/2021  ? NA 135 08/19/2021  ? K 4.5 08/19/2021  ? CL 98 08/19/2021  ? CREATININE 0.56 08/19/2021  ? BUN 10 08/19/2021  ? CO2 32 08/19/2021  ? TSH 0.66 05/26/2021  ? INR 2.0 (H) 07/14/2021  ? HGBA1C 7.2 (A) 09/01/2021  ? MICROALBUR 1.0  02/23/2020  ? ? ?No results found. ? ?Assessment & Plan:  ? ?Problem List Items Addressed This Visit   ? ? Acquired thrombophilia ([place])  ?  Her embolic stroke risk is managed with use of coumadin, INR is managed by [facility]

## 2021-09-01 NOTE — Telephone Encounter (Signed)
Rx has been corrected and resent.  ?

## 2021-09-01 NOTE — Telephone Encounter (Signed)
Pt daughter called stating pt need refill on amlodipine sent to [facility] in [place] ?

## 2021-09-01 NOTE — Assessment & Plan Note (Signed)
ikely dur ot recent ingesti nof sugar.  a1c is 7.2 by POC today  ?

## 2021-09-01 NOTE — Patient Instructions (Addendum)
Check sugar once a day   at various times  (choose one each day )  ? ?Fasting  ? ?2 hours after lunch ? ?2 hours after dinner ? ?2 hours after breakfast  ? ? ?You can add up to 2000 mg of acetominophen (tylenol) every day safely  In divided doses (500 mg every 6 hours  Or 1000 mg every 12 hours.)  ? ?Try the ensure max protein shakes.  They are lower in sugar and higher in protein than Boost  ? ?Call if home bp readings are persistently higher than 140/90 ? ? ?

## 2021-09-01 NOTE — Assessment & Plan Note (Signed)
Her embolic stroke risk is managed with use of coumadin, INR is managed by Coumadin clinic at [facility].  ?

## 2021-09-01 NOTE — Assessment & Plan Note (Signed)
Controlled with metoprolol and pacemaker  ?

## 2021-09-01 NOTE — Telephone Encounter (Signed)
Historical medication is it okay to refill? Pt was seen today.  ?

## 2021-09-01 NOTE — Assessment & Plan Note (Addendum)
No treatment advised today given a1c of 7.2   She was encouraged to resume monitoring of blood sugars for determination of need for medications.  Glucometer given.  Neuropathy symptoms are intermittent and mild. Advised to use tylenol prn neuropathy given relative c/i to gabapentin 2/2 history of falls.  ? ?..lasa1c ?Lab Results  ?Component Value Date  ? NA 135 08/19/2021  ? K 4.5 08/19/2021  ? CL 98 08/19/2021  ? CO2 32 08/19/2021  ? ? ?

## 2021-09-01 NOTE — Telephone Encounter (Signed)
Stacy From [facility] called in for clarification on direction for medication (Lancets (ONETOUCH DELICA PLUS [id number]) MISC)... Marzetta Board stated that the medication is test strips and not something you place in your mouth... Marzetta Board is requesting new script with correct direction... Marzetta Board stated if you have any question, you can call her at [phone] ?

## 2021-09-02 DIAGNOSIS — Z7901 Long term (current) use of anticoagulants: Secondary | ICD-10-CM | POA: Diagnosis not present

## 2021-09-02 MED ORDER — AMLODIPINE BESYLATE 10 MG PO TABS: 10.0000 mg | ORAL_TABLET | Freq: Every day | ORAL | 0 refills | Status: DC

## 2021-09-02 NOTE — Addendum Note (Signed)
Addended by: Adair Laundry on: 09/02/2021 11:27 AM ? ? Modules accepted: Orders ? ?

## 2021-09-03 ENCOUNTER — Ambulatory Visit: Payer: Medicare Other | Admitting: Internal Medicine

## 2021-09-10 DIAGNOSIS — Z20822 Contact with and (suspected) exposure to covid-19: Secondary | ICD-10-CM | POA: Diagnosis not present

## 2021-09-18 DIAGNOSIS — Z20822 Contact with and (suspected) exposure to covid-19: Secondary | ICD-10-CM | POA: Diagnosis not present

## 2021-09-25 ENCOUNTER — Other Ambulatory Visit: Payer: Self-pay | Admitting: Internal Medicine

## 2021-09-26 ENCOUNTER — Telehealth: Payer: Self-pay | Admitting: Internal Medicine

## 2021-09-26 MED ORDER — LISINOPRIL 40 MG PO TABS: 40.0000 mg | ORAL_TABLET | Freq: Two times a day (BID) | ORAL | 1 refills | Status: DC

## 2021-09-26 NOTE — Telephone Encounter (Signed)
Agree .  Lisinopril  Dose is 40 mg twice daily  ?

## 2021-09-26 NOTE — Telephone Encounter (Signed)
Sent with the verbal okay from Dr. Derrel Nip.  ?

## 2021-09-26 NOTE — Addendum Note (Signed)
Addended by: Adair Laundry on: 09/26/2021 05:45 PM ? ? Modules accepted: Orders ? ?

## 2021-09-26 NOTE — Telephone Encounter (Signed)
[place] needs directions on lisinopril (ZESTRIL) 20 MG tablet, that was sent in . ?

## 2021-09-29 DIAGNOSIS — Z20822 Contact with and (suspected) exposure to covid-19: Secondary | ICD-10-CM | POA: Diagnosis not present

## 2021-10-02 DIAGNOSIS — Z7901 Long term (current) use of anticoagulants: Secondary | ICD-10-CM | POA: Diagnosis not present

## 2021-10-15 ENCOUNTER — Ambulatory Visit (INDEPENDENT_AMBULATORY_CARE_PROVIDER_SITE_OTHER): Payer: Medicare Other

## 2021-10-15 DIAGNOSIS — Z111 Encounter for screening for respiratory tuberculosis: Secondary | ICD-10-CM

## 2021-10-15 NOTE — Progress Notes (Signed)
Patient came in today for PPD skin test administered in left forearm. Patient tolerated well with no signs of distress.  ? ?Patient's daughter accompanied patient & she left new FL-2 form. Forms given to [place].  ?

## 2021-10-17 ENCOUNTER — Ambulatory Visit (INDEPENDENT_AMBULATORY_CARE_PROVIDER_SITE_OTHER): Payer: Medicare Other

## 2021-10-17 DIAGNOSIS — Z111 Encounter for screening for respiratory tuberculosis: Secondary | ICD-10-CM

## 2021-10-17 LAB — TB SKIN TEST
Induration: 0 mm
TB Skin Test: NEGATIVE

## 2021-10-17 NOTE — Progress Notes (Signed)
Patient came in today for PPD read with negative or non-reactive results. Documented in EPIC. Accompanied by patient's daughter how was given paperwork filled out by Dr. Derrel Nip & was advised that if DNR was needed that they would need to schedule an appointment with Dr. Derrel Nip to review & sign. Pt's daughter verbalized understanding of this.  ?

## 2021-10-18 DIAGNOSIS — Z20822 Contact with and (suspected) exposure to covid-19: Secondary | ICD-10-CM | POA: Diagnosis not present

## 2021-10-20 DIAGNOSIS — Z20822 Contact with and (suspected) exposure to covid-19: Secondary | ICD-10-CM | POA: Diagnosis not present

## 2021-10-24 DIAGNOSIS — E119 Type 2 diabetes mellitus without complications: Secondary | ICD-10-CM | POA: Diagnosis not present

## 2021-10-24 DIAGNOSIS — H353131 Nonexudative age-related macular degeneration, bilateral, early dry stage: Secondary | ICD-10-CM | POA: Diagnosis not present

## 2021-10-24 DIAGNOSIS — H40031 Anatomical narrow angle, right eye: Secondary | ICD-10-CM | POA: Diagnosis not present

## 2021-10-24 LAB — HM DIABETES EYE EXAM

## 2021-10-29 ENCOUNTER — Encounter: Payer: Self-pay | Admitting: Internal Medicine

## 2021-10-29 ENCOUNTER — Ambulatory Visit (INDEPENDENT_AMBULATORY_CARE_PROVIDER_SITE_OTHER): Payer: Medicare Other | Admitting: Internal Medicine

## 2021-10-29 VITALS — BP 168/76 | HR 71 | Temp 97.3°F | Ht 66.0 in | Wt 121.8 lb

## 2021-10-29 DIAGNOSIS — R2689 Other abnormalities of gait and mobility: Secondary | ICD-10-CM | POA: Diagnosis not present

## 2021-10-29 DIAGNOSIS — Z7189 Other specified counseling: Secondary | ICD-10-CM | POA: Diagnosis not present

## 2021-10-29 DIAGNOSIS — I495 Sick sinus syndrome: Secondary | ICD-10-CM | POA: Diagnosis not present

## 2021-10-29 DIAGNOSIS — C50912 Malignant neoplasm of unspecified site of left female breast: Secondary | ICD-10-CM | POA: Diagnosis not present

## 2021-10-29 DIAGNOSIS — I700 Atherosclerosis of aorta: Secondary | ICD-10-CM

## 2021-10-29 NOTE — Patient Instructions (Signed)
? ?  I think physical therapy would be a great thing to have while you are away from home ? ?If the facility needs an order , let me know ? ?

## 2021-10-29 NOTE — Progress Notes (Signed)
Subjective:  Patient ID: Leah Holmes, female    DOB: 1926-12-17  Age: 88 y.o. MRN: [id number]  CC: The primary encounter diagnosis was DNR (do not resuscitate) discussion. Diagnoses of Aortic atherosclerosis ([place]), Balance problem, Counseling regarding end of life decision making, and Invasive ductal carcinoma of breast, female, left [facility]) were also pertinent to this visit.  Esn't  HPI Leah Holmes presents for follow up on chronic conditions and endo of life discussion  Chief Complaint  Patient presents with   Follow-up    Follow up to discuss DNR    Leah Holmes is  a 88 yr old white female with a history  type 2 DM,  chronic atrial fibrillation and Sick Sinus syndrome who lives with her daughter and [place] in [place].  She is preparing to have a 30 day stay in an A/L facility while her daughter is away on a planned vacation .    She feels generally well but is concerned about her poor balance.  She Had a fall in the bedroom in the middle of the night when she got up to use the rest room.  Slid down the side of the bed,.  Couldn't get up on her own . Crawled over to the recliner and got herself up .   bruised right arm  She had a pacemaker check today .  Has been working only 75% of the time.  Battery good for 5 more years.  Type 2 DM:  she is is not  exercising regularly or trying to lose weight. Checking  blood sugars less than once daily at variable times, usually only if she feels she may be having a hypoglycemic event. .  BS have been under 130 fasting and < 150 post prandially.  Denies any recent hypoglyemic events.  Taking   medications as directed. Following a carbohydrate modified diet 6 days per week. Denies numbness, burning and tingling of extremities. Appetite is good.  Eye exam last Thursday  small cataract right eye . No rx change  DNR discussion :  reviewed the meaning of a  DNR order .  She understands what it means and  states that "I've lived a good life; if my  heart stops or if I stop breathing , just let me go."  requests that I sign it for her     Outpatient Medications Prior to Visit  Medication Sig Dispense Refill   acetaminophen (TYLENOL) 325 MG tablet Take 2 tablets (650 mg total) by mouth every 6 (six) hours as needed.     albuterol (PROAIR HFA) 108 (90 Base) MCG/ACT inhaler Inhale 1-2 puffs into the lungs every 4 (four) hours as needed for wheezing or shortness of breath. 1 Inhaler 5   amLODipine (NORVASC) 10 MG tablet Take 1 tablet (10 mg total) by mouth daily. 30 tablet 0   atenolol (TENORMIN) 25 MG tablet Take 25 mg by mouth 2 (two) times daily.     calcitonin, salmon, (MIACALCIN/FORTICAL) 200 UNIT/ACT nasal spray Place 1 spray into alternate nostrils daily. 3.7 mL 0   Cholecalciferol (VITAMIN D3) 1000 UNITS CAPS Take 1,000 Units by mouth every morning.      docusate sodium (COLACE) 100 MG capsule Take 2 capsules (200 mg total) by mouth 2 (two) times daily. 60 capsule 0   glucose blood (ONE TOUCH ULTRA TEST) test strip USE 1 STRIP TO CHECK GLUCOSE TWICE DAILY  Dx code: E11.9 200 each 3   glucose blood (ONE TOUCH ULTRA TEST) test  strip USE ONE STRIP TO CHECK GLUCOSE ONCE  DAILY 100 each 3   glucose blood test strip Use to check blood sugars once daily. 100 each 12   hydrochlorothiazide (HYDRODIURIL) 25 MG tablet Take 12.5 mg by mouth daily.     ipratropium (ATROVENT) 0.03 % nasal spray Place 2 sprays into both nostrils every 12 (twelve) hours. 30 mL 12   isosorbide mononitrate (IMDUR) 60 MG 24 hr tablet Take 60 mg by mouth 2 (two) times daily.     Lancets (ONETOUCH DELICA PLUS [id number]) [place] Place 2 Devices inside cheek daily. DX Code E11.9. 100 each 2   lisinopril (ZESTRIL) 20 MG tablet Take 2 tablets by mouth twice daily 180 tablet 0   lisinopril (ZESTRIL) 40 MG tablet Take 1 tablet (40 mg total) by mouth 2 (two) times daily. 180 tablet 1   meclizine (ANTIVERT) 25 MG tablet Take 1 tablet by mouth three times daily as needed 40 tablet 0    miconazole (MICOTIN) 2 % cream Apply 1 application. topically 2 (two) times daily. 28.35 g 0   mometasone (ELOCON) 0.1 % cream Apply once ot twice daily as needed for itching 15 g 1   nitroGLYCERIN (NITROSTAT) 0.4 MG SL tablet Place 0.4 mg under the tongue every 5 (five) minutes as needed for chest pain.     omeprazole (PRILOSEC) 20 MG capsule Take 1 capsule by mouth once daily 90 capsule 0   ONETOUCH ULTRA test strip USE 1 STRIP TO CHECK GLUCOSE TWICE DAILY 100 each 0   Polyethyl Glycol-Propyl Glycol 0.4-0.3 % SOLN Place 1 drop into both eyes 3 (three) times daily as needed (for dry/irritated eyes.).     polyethylene glycol (MIRALAX / GLYCOLAX) 17 g packet Take 17 g by mouth daily as needed.     psyllium (METAMUCIL) 58.6 % packet Take 1 packet by mouth daily as needed (for regularity).      triamcinolone cream (KENALOG) 0.1 % APPLY 1 APPLICATION TOPICALLY TWICE DAILY 30 g 0   warfarin (COUMADIN) 2 MG tablet Take 2 mg by mouth 2 (two) times a week. Take '2mg'$  at bedtime on Wednesday & Sunday.     warfarin (COUMADIN) 4 MG tablet Take 4 mg by mouth daily at 6 PM. Does not take Wednesday or Sunday.     No facility-administered medications prior to visit.    Review of Systems;  Patient denies headache, fevers, malaise, unintentional weight loss, skin rash, eye pain, sinus congestion and sinus pain, sore throat, dysphagia,  hemoptysis , cough, dyspnea, wheezing, chest pain, palpitations, orthopnea, edema, abdominal pain, nausea, melena, diarrhea, constipation, flank pain, dysuria, hematuria, urinary  Frequency, nocturia, numbness, tingling, seizures,  Focal weakness, Loss of consciousness,  Tremor, insomnia, depression, anxiety, and suicidal ideation.      Objective:  BP (!) 168/76 (BP Location: Left Arm, Patient Position: Sitting, Cuff Size: Normal)   Pulse 71   Temp (!) 97.3 F (36.3 C) (Oral)   Ht '5\' 6"'$  (1.676 m)   Wt 121 lb 12.8 oz (55.2 kg)   SpO2 97%   BMI 19.66 kg/m   BP Readings  from Last 3 Encounters:  10/29/21 (!) 168/76  09/01/21 (!) 142/62  08/27/21 (S) (!) 184/74    Wt Readings from Last 3 Encounters:  10/29/21 121 lb 12.8 oz (55.2 kg)  09/01/21 123 lb 6.4 oz (56 kg)  07/16/21 123 lb 12.8 oz (56.2 kg)    General appearance: alert, cooperative and appears stated age Ears: normal TM's and external  ear canals both ears Throat: lips, mucosa, and tongue normal; teeth and gums normal Neck: no adenopathy, no carotid bruit, supple, symmetrical, trachea midline and thyroid not enlarged, symmetric, no tenderness/mass/nodules Back: symmetric, no curvature. ROM normal. No CVA tenderness. Lungs: clear to auscultation bilaterally Heart: regular rate and rhythm, S1, S2 normal, no murmur, click, rub or gallop Abdomen: soft, non-tender; bowel sounds normal; no masses,  no organomegaly Pulses: 2+ and symmetric Skin: Skin color, texture, turgor normal. No rashes or lesions Lymph nodes: Cervical, supraclavicular, and axillary nodes normal.  Lab Results  Component Value Date   HGBA1C 7.2 (A) 09/01/2021   HGBA1C 7.5 (H) 05/26/2021   HGBA1C 6.7 (H) 09/13/2020    Lab Results  Component Value Date   CREATININE 0.56 08/19/2021   CREATININE 0.67 07/14/2021   CREATININE 0.49 05/26/2021    Lab Results  Component Value Date   WBC 9.3 07/14/2021   HGB 15.1 (H) 07/14/2021   HCT 45.7 07/14/2021   PLT 220 07/14/2021   GLUCOSE 221 (H) 08/19/2021   CHOL 174 09/13/2020   TRIG 63.0 09/13/2020   HDL 60.10 09/13/2020   LDLDIRECT 116.0 10/30/2015   LDLCALC 101 (H) 09/13/2020   ALT 14 05/26/2021   AST 17 05/26/2021   NA 135 08/19/2021   K 4.5 08/19/2021   CL 98 08/19/2021   CREATININE 0.56 08/19/2021   BUN 10 08/19/2021   CO2 32 08/19/2021   TSH 0.66 05/26/2021   INR 2.0 (H) 07/14/2021   HGBA1C 7.2 (A) 09/01/2021   MICROALBUR 1.0 02/23/2020    No results found.  Assessment & Plan:   Problem List Items Addressed This Visit     Aortic atherosclerosis ([place])     Untreated  due to intolerance to prior trials of generic Lipitor and generic crestor .  myoview was done in 2019 by Dr Erin Fulling due ot her known CAD and was negative for inducible ischemia   Lab Results  Component Value Date   CHOL 174 09/13/2020   HDL 60.10 09/13/2020   LDLCALC 101 (H) 09/13/2020   LDLDIRECT 116.0 10/30/2015   TRIG 63.0 09/13/2020   CHOLHDL 3 09/13/2020   Lab Results  Component Value Date   ALT 14 05/26/2021   AST 17 05/26/2021   ALKPHOS 85 05/26/2021   BILITOT 0.8 05/26/2021         Balance problem    Secondary to proximal muscle muscle weakness .  She has had a recent fall. I have recommended that she have PT while she is staying at the facility for a month and she is in agreement        Counseling regarding end of life decision making    During the course of the visit , End of Life objectives were discussed at length, with patient and her daughter.   DNR orders were signed        Invasive ductal carcinoma of breast, female, left ([place])    S/p resection in 2020 .  No other therapy planned due to age        Other Visit Diagnoses     DNR (do not resuscitate) discussion    -  Primary   Relevant Orders   DNR (Do Not Resuscitate)     Follow-up: Return in about 3 months (around 01/29/2022) for follow up diabetes.   Crecencio Mc, MD

## 2021-10-30 DIAGNOSIS — Z7901 Long term (current) use of anticoagulants: Secondary | ICD-10-CM | POA: Diagnosis not present

## 2021-11-01 ENCOUNTER — Encounter: Payer: Self-pay | Admitting: Internal Medicine

## 2021-11-01 DIAGNOSIS — Z7189 Other specified counseling: Secondary | ICD-10-CM | POA: Insufficient documentation

## 2021-11-01 NOTE — Assessment & Plan Note (Addendum)
S/p resection in 2020 .  No other therapy planned due to age

## 2021-11-01 NOTE — Assessment & Plan Note (Signed)
During the course of the visit , End of Life objectives were discussed at length, with patient and her daughter.   DNR orders were signed

## 2021-11-01 NOTE — Assessment & Plan Note (Signed)
Untreated  due to intolerance to prior trials of generic Lipitor and generic crestor .  myoview was done in 2019 by Dr Erin Fulling due ot her known CAD and was negative for inducible ischemia   Lab Results  Component Value Date   CHOL 174 09/13/2020   HDL 60.10 09/13/2020   LDLCALC 101 (H) 09/13/2020   LDLDIRECT 116.0 10/30/2015   TRIG 63.0 09/13/2020   CHOLHDL 3 09/13/2020   Lab Results  Component Value Date   ALT 14 05/26/2021   AST 17 05/26/2021   ALKPHOS 85 05/26/2021   BILITOT 0.8 05/26/2021

## 2021-11-01 NOTE — Assessment & Plan Note (Signed)
Secondary to proximal muscle muscle weakness .  She has had a recent fall. I have recommended that she have PT while she is staying at the facility for a month and she is in agreement

## 2021-11-04 ENCOUNTER — Telehealth: Payer: Self-pay

## 2021-11-04 NOTE — Telephone Encounter (Signed)
Pt daughter want a letter stating pt can administer her own medicine sent to [facility] [address]

## 2021-11-04 NOTE — Telephone Encounter (Signed)
Printed and placed in quick sign folder 

## 2021-11-04 NOTE — Telephone Encounter (Signed)
-----   Message from Ashley Jacobs sent at 11/04/2021  9:27 AM EDT ----- Regarding: order Good morning!  Happy Tuesday!  [facility] called needing PT and OT order pt will be in [facility] for 30 days respite care while daughter goes on vaca. Daughter is wanting pt to have some PT and OT while there. Please Dx Aortic Atherosclerosis and use Atrial Fibrillation on the order Please fax to [phone].  Thank you!

## 2021-11-04 NOTE — Telephone Encounter (Signed)
Is it okay to type up the letter stating that pt is able to administer her own medications while she is as respite care?

## 2021-11-05 ENCOUNTER — Other Ambulatory Visit: Payer: Self-pay | Admitting: Internal Medicine

## 2021-11-05 DIAGNOSIS — I482 Chronic atrial fibrillation, unspecified: Secondary | ICD-10-CM

## 2021-11-05 DIAGNOSIS — Z9181 History of falling: Secondary | ICD-10-CM

## 2021-11-05 DIAGNOSIS — I700 Atherosclerosis of aorta: Secondary | ICD-10-CM

## 2021-11-05 NOTE — Telephone Encounter (Signed)
Letter has been placed in quick sign folder for signature.

## 2021-11-06 ENCOUNTER — Telehealth: Payer: Self-pay

## 2021-11-06 ENCOUNTER — Telehealth: Payer: Self-pay | Admitting: Internal Medicine

## 2021-11-06 NOTE — Telephone Encounter (Signed)
Sheran Lawless From [facility] called in stating that Dr. Derrel Nip had filled out [facility] paperwork for pt... Sheran Lawless stated that on the [facility] paperwork there was no indication of how much mg that the pt was suppose to be taking of the medication (warfarin (COUMADIN)) and (acetaminophen (TYLENOL))... Sheran Lawless stated that there was no information on FL2 indicating how much, how often, or strength of medication that the pt suppose to have... Sheran Lawless stated that she will fax back the [facility] paper work so it can be updated... Sheran Lawless stated if Dr. Derrel Nip have any questions or concern to call her on [phone]

## 2021-11-07 NOTE — Telephone Encounter (Signed)
Both letters have been faxed and pt's daughter is aware.

## 2021-11-12 NOTE — Telephone Encounter (Signed)
Completed and faxed back

## 2021-11-13 ENCOUNTER — Telehealth: Payer: Self-pay

## 2021-11-13 NOTE — Telephone Encounter (Signed)
FYI

## 2021-11-13 NOTE — Telephone Encounter (Signed)
[name] called from [facility] to state patient requested they wait until 11/18/2021 to begin in-home physical therapy.

## 2021-11-17 ENCOUNTER — Telehealth: Payer: Self-pay | Admitting: Internal Medicine

## 2021-11-17 NOTE — Telephone Encounter (Signed)
Tanisha from [facility], [phone], Fax # [phone]. Requesting a PT INR order.

## 2021-11-17 NOTE — Telephone Encounter (Signed)
LMTCB

## 2021-11-18 DIAGNOSIS — Z950 Presence of cardiac pacemaker: Secondary | ICD-10-CM | POA: Diagnosis not present

## 2021-11-18 DIAGNOSIS — I10 Essential (primary) hypertension: Secondary | ICD-10-CM | POA: Diagnosis not present

## 2021-11-18 DIAGNOSIS — I2510 Atherosclerotic heart disease of native coronary artery without angina pectoris: Secondary | ICD-10-CM | POA: Diagnosis not present

## 2021-11-18 DIAGNOSIS — I700 Atherosclerosis of aorta: Secondary | ICD-10-CM | POA: Diagnosis not present

## 2021-11-18 DIAGNOSIS — I495 Sick sinus syndrome: Secondary | ICD-10-CM | POA: Diagnosis not present

## 2021-11-18 DIAGNOSIS — C50912 Malignant neoplasm of unspecified site of left female breast: Secondary | ICD-10-CM | POA: Diagnosis not present

## 2021-11-18 DIAGNOSIS — H9190 Unspecified hearing loss, unspecified ear: Secondary | ICD-10-CM | POA: Diagnosis not present

## 2021-11-18 DIAGNOSIS — M8008XD Age-related osteoporosis with current pathological fracture, vertebra(e), subsequent encounter for fracture with routine healing: Secondary | ICD-10-CM | POA: Diagnosis not present

## 2021-11-18 DIAGNOSIS — Z9181 History of falling: Secondary | ICD-10-CM | POA: Diagnosis not present

## 2021-11-18 DIAGNOSIS — K219 Gastro-esophageal reflux disease without esophagitis: Secondary | ICD-10-CM | POA: Diagnosis not present

## 2021-11-18 DIAGNOSIS — Z7901 Long term (current) use of anticoagulants: Secondary | ICD-10-CM | POA: Diagnosis not present

## 2021-11-18 DIAGNOSIS — I482 Chronic atrial fibrillation, unspecified: Secondary | ICD-10-CM | POA: Diagnosis not present

## 2021-11-18 DIAGNOSIS — E119 Type 2 diabetes mellitus without complications: Secondary | ICD-10-CM | POA: Diagnosis not present

## 2021-11-19 NOTE — Telephone Encounter (Signed)
Leah Holmes called back and was given the number to cardiology since that is who takes care of her INR.

## 2021-11-21 DIAGNOSIS — E119 Type 2 diabetes mellitus without complications: Secondary | ICD-10-CM | POA: Diagnosis not present

## 2021-11-21 DIAGNOSIS — M8008XD Age-related osteoporosis with current pathological fracture, vertebra(e), subsequent encounter for fracture with routine healing: Secondary | ICD-10-CM | POA: Diagnosis not present

## 2021-11-21 DIAGNOSIS — I700 Atherosclerosis of aorta: Secondary | ICD-10-CM | POA: Diagnosis not present

## 2021-11-21 DIAGNOSIS — I482 Chronic atrial fibrillation, unspecified: Secondary | ICD-10-CM | POA: Diagnosis not present

## 2021-11-21 DIAGNOSIS — I495 Sick sinus syndrome: Secondary | ICD-10-CM | POA: Diagnosis not present

## 2021-11-21 DIAGNOSIS — C50912 Malignant neoplasm of unspecified site of left female breast: Secondary | ICD-10-CM | POA: Diagnosis not present

## 2021-11-24 DIAGNOSIS — I700 Atherosclerosis of aorta: Secondary | ICD-10-CM | POA: Diagnosis not present

## 2021-11-24 DIAGNOSIS — I495 Sick sinus syndrome: Secondary | ICD-10-CM | POA: Diagnosis not present

## 2021-11-24 DIAGNOSIS — M8008XD Age-related osteoporosis with current pathological fracture, vertebra(e), subsequent encounter for fracture with routine healing: Secondary | ICD-10-CM | POA: Diagnosis not present

## 2021-11-24 DIAGNOSIS — I482 Chronic atrial fibrillation, unspecified: Secondary | ICD-10-CM | POA: Diagnosis not present

## 2021-11-24 DIAGNOSIS — E119 Type 2 diabetes mellitus without complications: Secondary | ICD-10-CM | POA: Diagnosis not present

## 2021-11-24 DIAGNOSIS — C50912 Malignant neoplasm of unspecified site of left female breast: Secondary | ICD-10-CM | POA: Diagnosis not present

## 2021-11-25 DIAGNOSIS — E119 Type 2 diabetes mellitus without complications: Secondary | ICD-10-CM | POA: Diagnosis not present

## 2021-11-25 DIAGNOSIS — I10 Essential (primary) hypertension: Secondary | ICD-10-CM | POA: Diagnosis not present

## 2021-11-25 DIAGNOSIS — I482 Chronic atrial fibrillation, unspecified: Secondary | ICD-10-CM | POA: Diagnosis not present

## 2021-11-25 DIAGNOSIS — Z7901 Long term (current) use of anticoagulants: Secondary | ICD-10-CM | POA: Diagnosis not present

## 2021-11-25 DIAGNOSIS — I700 Atherosclerosis of aorta: Secondary | ICD-10-CM | POA: Diagnosis not present

## 2021-11-25 DIAGNOSIS — M8008XD Age-related osteoporosis with current pathological fracture, vertebra(e), subsequent encounter for fracture with routine healing: Secondary | ICD-10-CM | POA: Diagnosis not present

## 2021-11-25 DIAGNOSIS — Z9181 History of falling: Secondary | ICD-10-CM

## 2021-11-25 DIAGNOSIS — C50912 Malignant neoplasm of unspecified site of left female breast: Secondary | ICD-10-CM | POA: Diagnosis not present

## 2021-11-25 DIAGNOSIS — I495 Sick sinus syndrome: Secondary | ICD-10-CM | POA: Diagnosis not present

## 2021-11-25 DIAGNOSIS — Z950 Presence of cardiac pacemaker: Secondary | ICD-10-CM | POA: Diagnosis not present

## 2021-11-25 DIAGNOSIS — K219 Gastro-esophageal reflux disease without esophagitis: Secondary | ICD-10-CM | POA: Diagnosis not present

## 2021-11-25 DIAGNOSIS — I2510 Atherosclerotic heart disease of native coronary artery without angina pectoris: Secondary | ICD-10-CM | POA: Diagnosis not present

## 2021-11-25 DIAGNOSIS — H9190 Unspecified hearing loss, unspecified ear: Secondary | ICD-10-CM | POA: Diagnosis not present

## 2021-11-28 DIAGNOSIS — I482 Chronic atrial fibrillation, unspecified: Secondary | ICD-10-CM | POA: Diagnosis not present

## 2021-11-28 DIAGNOSIS — M8008XD Age-related osteoporosis with current pathological fracture, vertebra(e), subsequent encounter for fracture with routine healing: Secondary | ICD-10-CM | POA: Diagnosis not present

## 2021-11-28 DIAGNOSIS — I700 Atherosclerosis of aorta: Secondary | ICD-10-CM | POA: Diagnosis not present

## 2021-11-28 DIAGNOSIS — C50912 Malignant neoplasm of unspecified site of left female breast: Secondary | ICD-10-CM | POA: Diagnosis not present

## 2021-11-28 DIAGNOSIS — I495 Sick sinus syndrome: Secondary | ICD-10-CM | POA: Diagnosis not present

## 2021-11-28 DIAGNOSIS — E119 Type 2 diabetes mellitus without complications: Secondary | ICD-10-CM | POA: Diagnosis not present

## 2021-12-03 DIAGNOSIS — Z7901 Long term (current) use of anticoagulants: Secondary | ICD-10-CM | POA: Diagnosis not present

## 2021-12-07 IMAGING — MG DIGITAL DIAGNOSTIC UNILAT LEFT W/ CAD
6 series · 6 of 6 positions shown · non-contrast
Comparison: Previous exam(s).

CLINICAL DATA: [AGE] female recalled from screening mammogram
dated 01/27/2021 for left breast calcifications. History of treated
left breast cancer.

EXAM:
DIGITAL DIAGNOSTIC UNILATERAL LEFT MAMMOGRAM WITH CAD
TECHNIQUE: Left digital diagnostic mammography was performed. Mammographic
images were processed with CAD.

[L ML (1 of 4)]
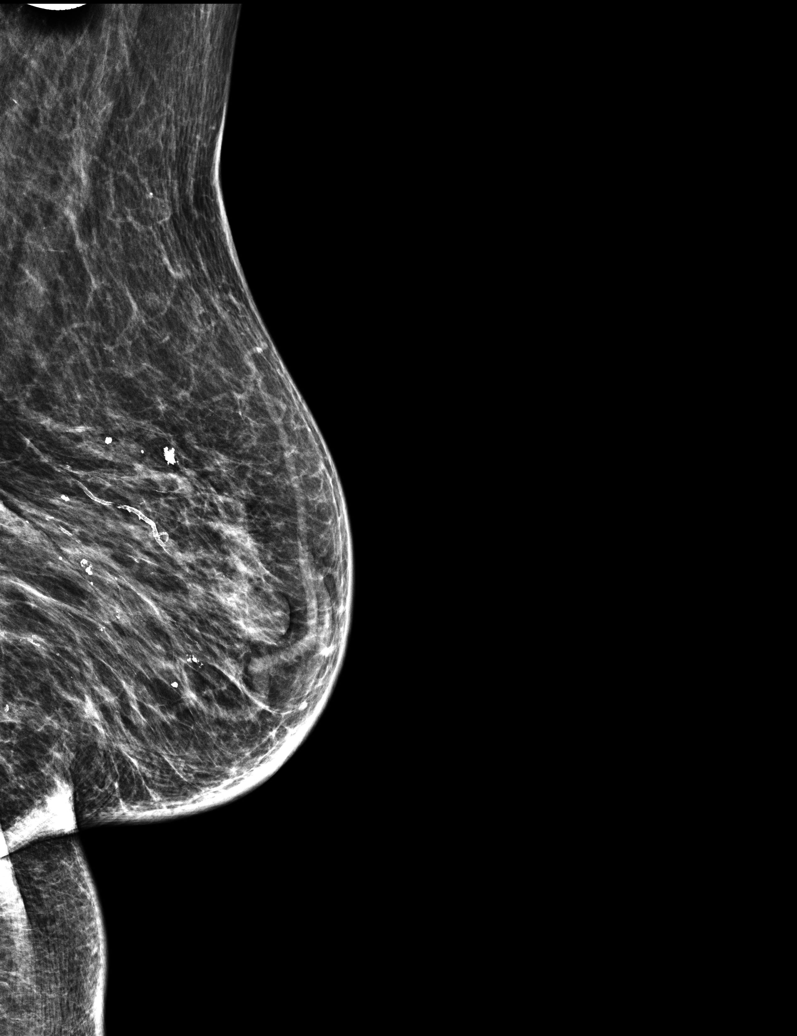

[L XCCL (1 of 2)]
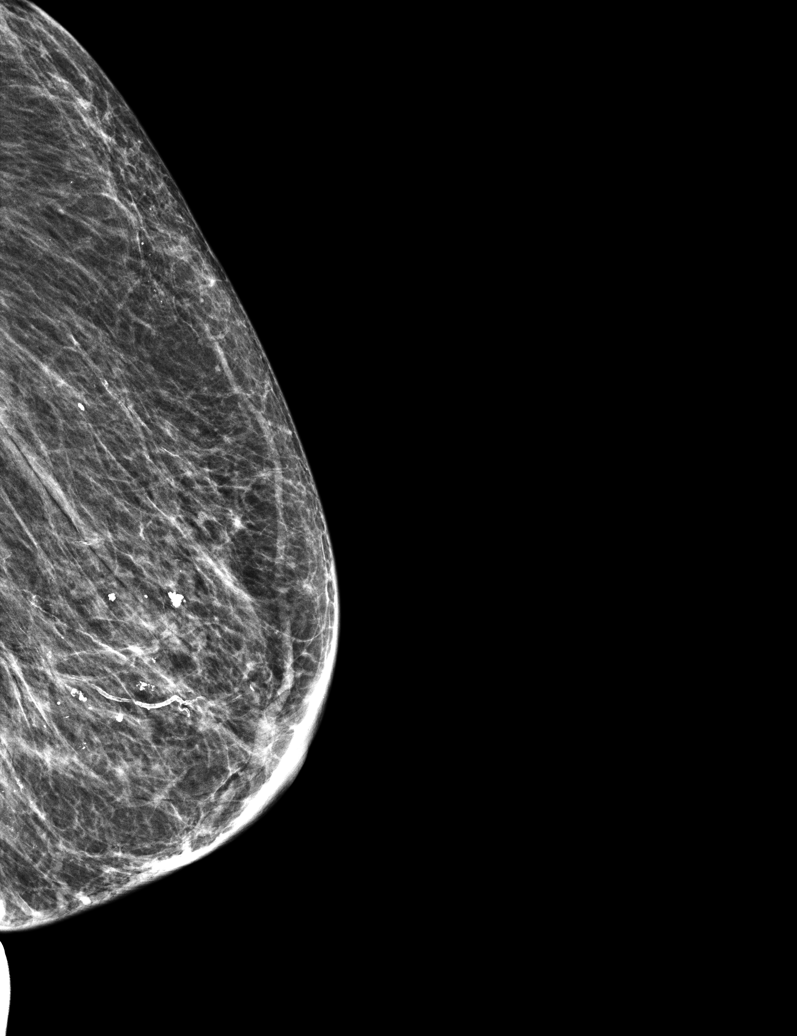

[L ML (2 of 4)]
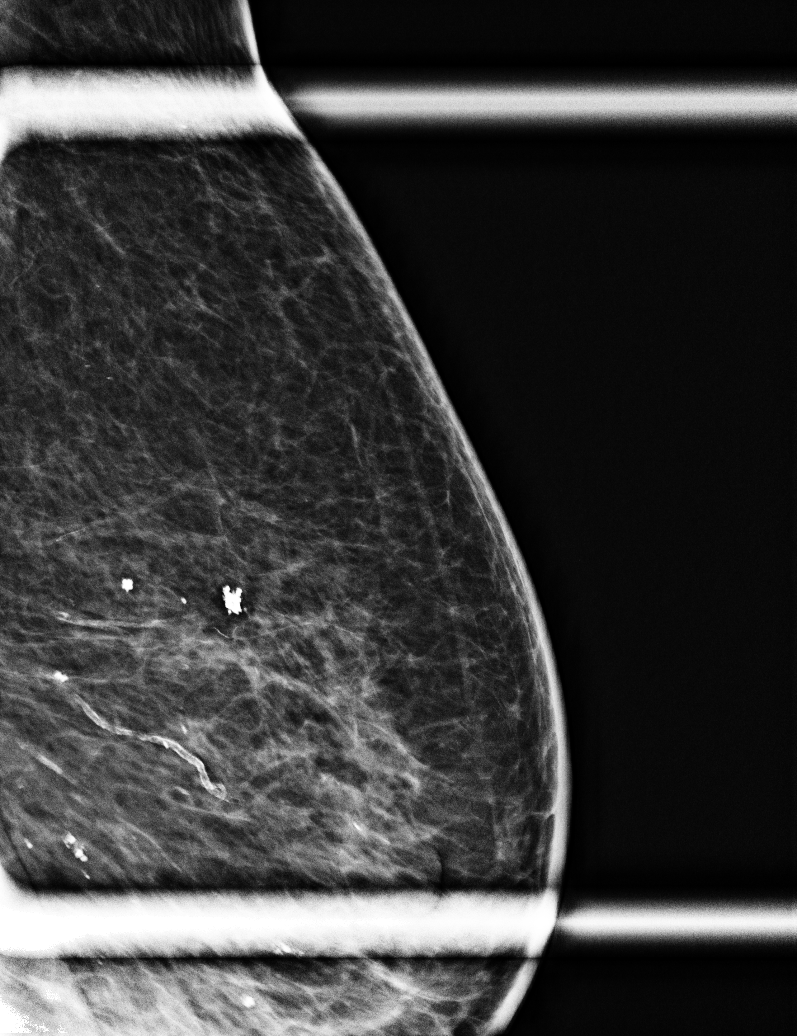

[L ML (3 of 4)]
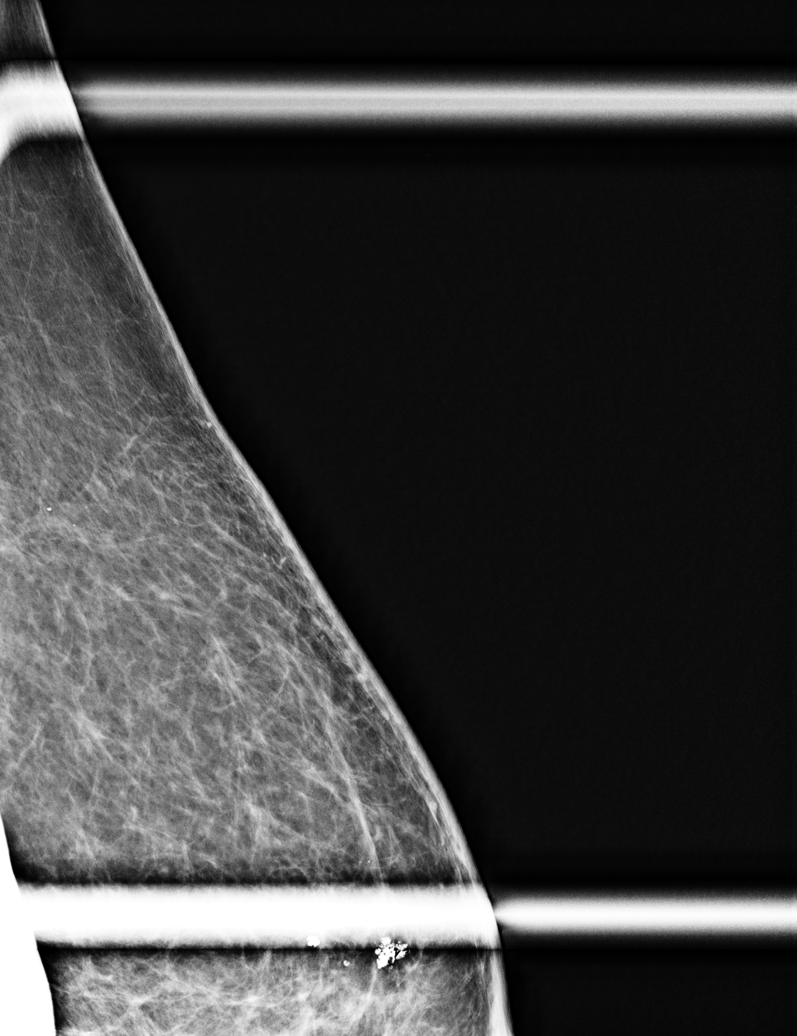

[L XCCL (2 of 2)]
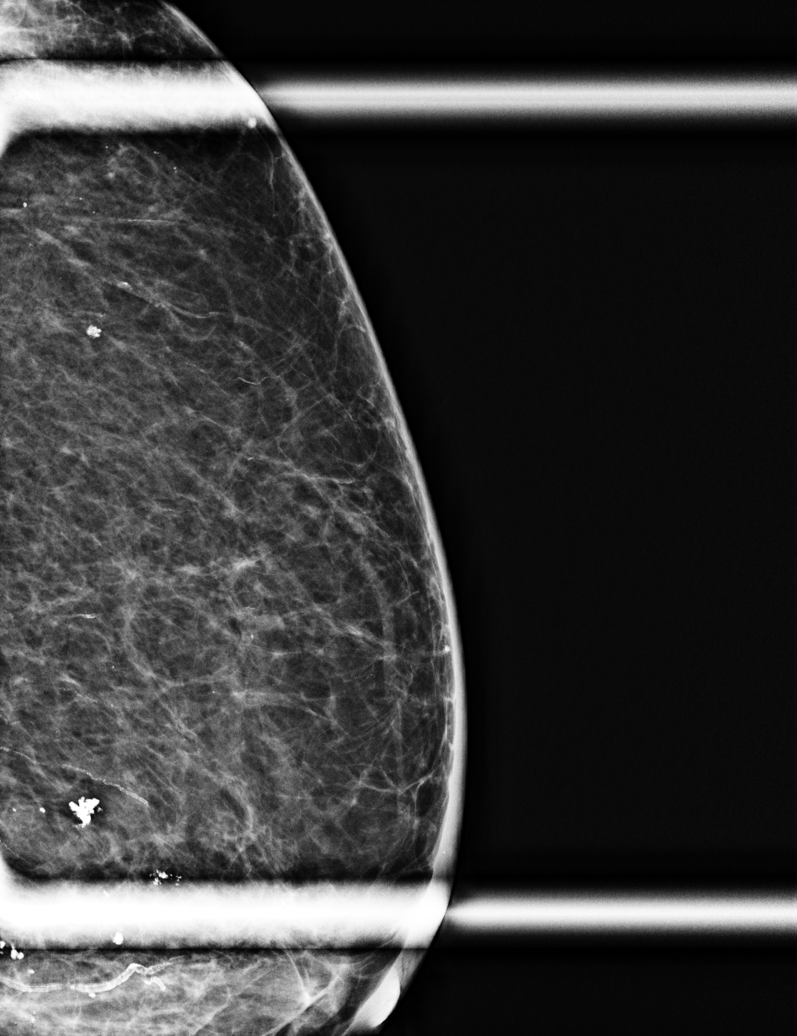

[L ML (4 of 4)]
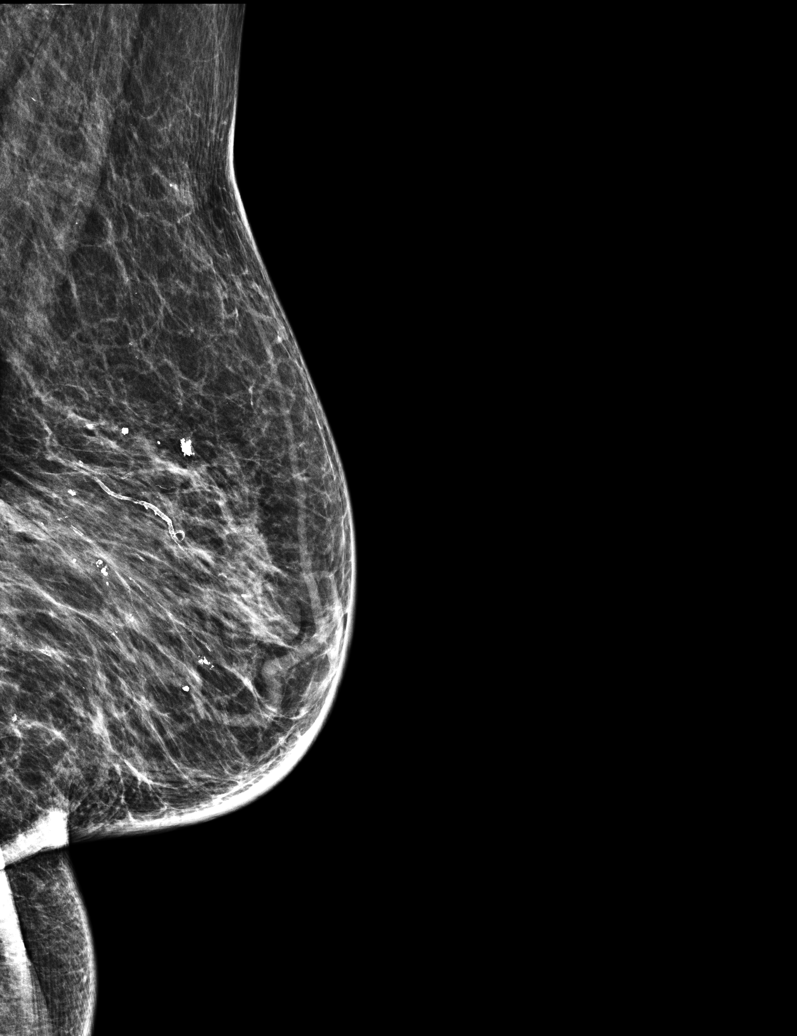

[6 of 6 positions shown; findings below may reference images not displayed]

ACR Breast Density Category b: There are scattered areas of
fibroglandular density.
FINDINGS: There are no suspicious calcifications in the upper left breast.
Note is made of scattered punctate and coarse calcifications
throughout the left breast consistent with a benign etiology.
IMPRESSION: No mammographic evidence of malignancy.

RECOMMENDATION:
Routine annual screening as clinically indicated.

I have discussed the findings and recommendations with the patient.
If applicable, a reminder letter will be sent to the patient
regarding the next appointment.

BI-RADS CATEGORY  2: Benign.

## 2021-12-08 ENCOUNTER — Other Ambulatory Visit: Payer: Self-pay | Admitting: General Surgery

## 2021-12-08 DIAGNOSIS — Z1231 Encounter for screening mammogram for malignant neoplasm of breast: Secondary | ICD-10-CM

## 2021-12-18 DIAGNOSIS — I700 Atherosclerosis of aorta: Secondary | ICD-10-CM | POA: Diagnosis not present

## 2021-12-18 DIAGNOSIS — H9190 Unspecified hearing loss, unspecified ear: Secondary | ICD-10-CM | POA: Diagnosis not present

## 2021-12-18 DIAGNOSIS — Z9181 History of falling: Secondary | ICD-10-CM | POA: Diagnosis not present

## 2021-12-18 DIAGNOSIS — E119 Type 2 diabetes mellitus without complications: Secondary | ICD-10-CM | POA: Diagnosis not present

## 2021-12-18 DIAGNOSIS — C50912 Malignant neoplasm of unspecified site of left female breast: Secondary | ICD-10-CM | POA: Diagnosis not present

## 2021-12-18 DIAGNOSIS — Z950 Presence of cardiac pacemaker: Secondary | ICD-10-CM | POA: Diagnosis not present

## 2021-12-18 DIAGNOSIS — I2510 Atherosclerotic heart disease of native coronary artery without angina pectoris: Secondary | ICD-10-CM | POA: Diagnosis not present

## 2021-12-18 DIAGNOSIS — I482 Chronic atrial fibrillation, unspecified: Secondary | ICD-10-CM | POA: Diagnosis not present

## 2021-12-18 DIAGNOSIS — I495 Sick sinus syndrome: Secondary | ICD-10-CM | POA: Diagnosis not present

## 2021-12-18 DIAGNOSIS — M8008XD Age-related osteoporosis with current pathological fracture, vertebra(e), subsequent encounter for fracture with routine healing: Secondary | ICD-10-CM | POA: Diagnosis not present

## 2021-12-18 DIAGNOSIS — K219 Gastro-esophageal reflux disease without esophagitis: Secondary | ICD-10-CM | POA: Diagnosis not present

## 2021-12-18 DIAGNOSIS — I10 Essential (primary) hypertension: Secondary | ICD-10-CM | POA: Diagnosis not present

## 2021-12-18 DIAGNOSIS — Z7901 Long term (current) use of anticoagulants: Secondary | ICD-10-CM | POA: Diagnosis not present

## 2021-12-24 ENCOUNTER — Other Ambulatory Visit: Payer: Self-pay | Admitting: General Surgery

## 2021-12-24 DIAGNOSIS — Z1231 Encounter for screening mammogram for malignant neoplasm of breast: Secondary | ICD-10-CM

## 2021-12-29 DIAGNOSIS — Z7901 Long term (current) use of anticoagulants: Secondary | ICD-10-CM | POA: Diagnosis not present

## 2021-12-30 DIAGNOSIS — Z7901 Long term (current) use of anticoagulants: Secondary | ICD-10-CM | POA: Diagnosis not present

## 2021-12-31 DIAGNOSIS — I482 Chronic atrial fibrillation, unspecified: Secondary | ICD-10-CM | POA: Diagnosis not present

## 2021-12-31 DIAGNOSIS — I495 Sick sinus syndrome: Secondary | ICD-10-CM | POA: Diagnosis not present

## 2021-12-31 DIAGNOSIS — I700 Atherosclerosis of aorta: Secondary | ICD-10-CM | POA: Diagnosis not present

## 2021-12-31 DIAGNOSIS — E119 Type 2 diabetes mellitus without complications: Secondary | ICD-10-CM | POA: Diagnosis not present

## 2021-12-31 DIAGNOSIS — M8008XD Age-related osteoporosis with current pathological fracture, vertebra(e), subsequent encounter for fracture with routine healing: Secondary | ICD-10-CM | POA: Diagnosis not present

## 2021-12-31 DIAGNOSIS — C50912 Malignant neoplasm of unspecified site of left female breast: Secondary | ICD-10-CM | POA: Diagnosis not present

## 2022-01-14 DIAGNOSIS — Z7901 Long term (current) use of anticoagulants: Secondary | ICD-10-CM | POA: Diagnosis not present

## 2022-01-21 ENCOUNTER — Other Ambulatory Visit: Payer: Self-pay | Admitting: Internal Medicine

## 2022-01-28 DIAGNOSIS — I10 Essential (primary) hypertension: Secondary | ICD-10-CM | POA: Diagnosis not present

## 2022-01-28 DIAGNOSIS — I2510 Atherosclerotic heart disease of native coronary artery without angina pectoris: Secondary | ICD-10-CM | POA: Diagnosis not present

## 2022-01-28 DIAGNOSIS — E782 Mixed hyperlipidemia: Secondary | ICD-10-CM | POA: Diagnosis not present

## 2022-01-28 DIAGNOSIS — I340 Nonrheumatic mitral (valve) insufficiency: Secondary | ICD-10-CM | POA: Diagnosis not present

## 2022-01-28 DIAGNOSIS — I350 Nonrheumatic aortic (valve) stenosis: Secondary | ICD-10-CM | POA: Diagnosis not present

## 2022-01-28 DIAGNOSIS — I495 Sick sinus syndrome: Secondary | ICD-10-CM | POA: Diagnosis not present

## 2022-01-28 DIAGNOSIS — I6523 Occlusion and stenosis of bilateral carotid arteries: Secondary | ICD-10-CM | POA: Diagnosis not present

## 2022-01-28 DIAGNOSIS — I071 Rheumatic tricuspid insufficiency: Secondary | ICD-10-CM | POA: Diagnosis not present

## 2022-01-28 DIAGNOSIS — I482 Chronic atrial fibrillation, unspecified: Secondary | ICD-10-CM | POA: Diagnosis not present

## 2022-01-29 ENCOUNTER — Ambulatory Visit
Admission: RE | Admit: 2022-01-29 | Discharge: 2022-01-29 | Disposition: A | Discharge status: Home / Self Care | Payer: Medicare Other | Source: Ambulatory Visit | Attending: General Surgery | Admitting: General Surgery

## 2022-01-29 DIAGNOSIS — Z1231 Encounter for screening mammogram for malignant neoplasm of breast: Secondary | ICD-10-CM | POA: Diagnosis not present

## 2022-02-03 DIAGNOSIS — Z853 Personal history of malignant neoplasm of breast: Secondary | ICD-10-CM | POA: Diagnosis not present

## 2022-02-17 DIAGNOSIS — Z7901 Long term (current) use of anticoagulants: Secondary | ICD-10-CM | POA: Diagnosis not present

## 2022-03-18 DIAGNOSIS — Z7901 Long term (current) use of anticoagulants: Secondary | ICD-10-CM | POA: Diagnosis not present

## 2022-03-21 ENCOUNTER — Other Ambulatory Visit: Payer: Self-pay | Admitting: Internal Medicine

## 2022-03-31 DIAGNOSIS — I495 Sick sinus syndrome: Secondary | ICD-10-CM | POA: Diagnosis not present

## 2022-04-08 ENCOUNTER — Ambulatory Visit (INDEPENDENT_AMBULATORY_CARE_PROVIDER_SITE_OTHER): Payer: Medicare Other

## 2022-04-08 ENCOUNTER — Ambulatory Visit (INDEPENDENT_AMBULATORY_CARE_PROVIDER_SITE_OTHER): Payer: Medicare Other | Admitting: Internal Medicine

## 2022-04-08 ENCOUNTER — Encounter: Payer: Self-pay | Admitting: Internal Medicine

## 2022-04-08 VITALS — BP 156/62 | HR 67 | Temp 97.4°F | Ht 66.0 in | Wt 117.2 lb

## 2022-04-08 DIAGNOSIS — E1142 Type 2 diabetes mellitus with diabetic polyneuropathy: Secondary | ICD-10-CM

## 2022-04-08 DIAGNOSIS — R0781 Pleurodynia: Secondary | ICD-10-CM

## 2022-04-08 DIAGNOSIS — C50912 Malignant neoplasm of unspecified site of left female breast: Secondary | ICD-10-CM | POA: Diagnosis not present

## 2022-04-08 DIAGNOSIS — I495 Sick sinus syndrome: Secondary | ICD-10-CM

## 2022-04-08 DIAGNOSIS — Z950 Presence of cardiac pacemaker: Secondary | ICD-10-CM | POA: Diagnosis not present

## 2022-04-08 DIAGNOSIS — I482 Chronic atrial fibrillation, unspecified: Secondary | ICD-10-CM | POA: Diagnosis not present

## 2022-04-08 DIAGNOSIS — S2231XD Fracture of one rib, right side, subsequent encounter for fracture with routine healing: Secondary | ICD-10-CM

## 2022-04-08 DIAGNOSIS — E785 Hyperlipidemia, unspecified: Secondary | ICD-10-CM

## 2022-04-08 DIAGNOSIS — E1169 Type 2 diabetes mellitus with other specified complication: Secondary | ICD-10-CM | POA: Diagnosis not present

## 2022-04-08 DIAGNOSIS — M79672 Pain in left foot: Secondary | ICD-10-CM

## 2022-04-08 DIAGNOSIS — R109 Unspecified abdominal pain: Secondary | ICD-10-CM | POA: Diagnosis not present

## 2022-04-08 DIAGNOSIS — R5382 Chronic fatigue, unspecified: Secondary | ICD-10-CM

## 2022-04-08 DIAGNOSIS — S2241XA Multiple fractures of ribs, right side, initial encounter for closed fracture: Secondary | ICD-10-CM | POA: Diagnosis not present

## 2022-04-08 DIAGNOSIS — Z853 Personal history of malignant neoplasm of breast: Secondary | ICD-10-CM | POA: Diagnosis not present

## 2022-04-08 NOTE — Progress Notes (Signed)
Subjective:  Patient ID: Leah Holmes, female    DOB: 1927/02/10  Age: 88 y.o. MRN: [id number]  CC: The primary encounter diagnosis was Rib pain on right side. Diagnoses of Closed fracture of one rib of right side with routine healing, subsequent encounter, Hyperlipidemia associated with type 2 diabetes mellitus ([place]), Acute right flank pain, DM type 2 with diabetic peripheral neuropathy ([place]), Chronic fatigue, Left foot pain, Chronic atrial fibrillation ([place]), Sick sinus syndrome ([place]), and Invasive ductal carcinoma of breast, female, left ([place]) were also pertinent to this visit.   HPI Leah Holmes presents for evaluation of multiple complaints.   1) foot pain. Left foot and forefoot pain with weight bearing . Thinks it was bruised earlier in the week.  Feels a knot  2) hypertension : Patient is taking her medications as prescribed and notes no adverse effects.  Home BP readings have been done about once per week and are  generally < 130/80 .  She is avoiding added salt in her diet and walking regularly about 3 times per week for exercise  .   3) Sick sinus syndrome; managed with pacemaker,  last interrogation in may [name])  4) h/o breast cancer:  saw [facility] in August .  Mammogram normal.  Advised to discontinue   5) new onset right sided lateral rib pain for the past 5 days  transeintly relieved  with tylenol  .  The pain is mild at times but becomes severe at times with no provocation .   No cough. Some sneezing , runny nose     Outpatient Medications Prior to Visit  Medication Sig Dispense Refill   acetaminophen (TYLENOL) 325 MG tablet Take 2 tablets (650 mg total) by mouth every 6 (six) hours as needed.     albuterol (PROAIR HFA) 108 (90 Base) MCG/ACT inhaler Inhale 1-2 puffs into the lungs every 4 (four) hours as needed for wheezing or shortness of breath. 1 Inhaler 5   amLODipine (NORVASC) 10 MG tablet Take 1 tablet (10 mg total) by mouth daily. 30 tablet 0    atenolol (TENORMIN) 25 MG tablet Take 25 mg by mouth 2 (two) times daily.     Cholecalciferol (VITAMIN D3) 1000 UNITS CAPS Take 1,000 Units by mouth every morning.      docusate sodium (COLACE) 100 MG capsule Take 2 capsules (200 mg total) by mouth 2 (two) times daily. 60 capsule 0   glucose blood (ONE TOUCH ULTRA TEST) test strip USE 1 STRIP TO CHECK GLUCOSE TWICE DAILY  Dx code: E11.9 200 each 3   glucose blood (ONE TOUCH ULTRA TEST) test strip USE ONE STRIP TO CHECK GLUCOSE ONCE  DAILY 100 each 3   glucose blood test strip Use to check blood sugars once daily. 100 each 12   hydrochlorothiazide (HYDRODIURIL) 25 MG tablet Take 12.5 mg by mouth daily.     ipratropium (ATROVENT) 0.03 % nasal spray Place 2 sprays into both nostrils every 12 (twelve) hours. 30 mL 12   isosorbide mononitrate (IMDUR) 60 MG 24 hr tablet Take 60 mg by mouth 2 (two) times daily.     Lancets (ONETOUCH DELICA PLUS [id number]) [place] Place 2 Devices inside cheek daily. DX Code E11.9. 100 each 2   lisinopril (ZESTRIL) 40 MG tablet Take 1 tablet by mouth twice daily 180 tablet 0   meclizine (ANTIVERT) 25 MG tablet Take 1 tablet by mouth three times daily as needed 40 tablet 0   miconazole (MICOTIN) 2 % cream  Apply 1 application. topically 2 (two) times daily. 28.35 g 0   mometasone (ELOCON) 0.1 % cream Apply once ot twice daily as needed for itching 15 g 1   nitroGLYCERIN (NITROSTAT) 0.4 MG SL tablet Place 0.4 mg under the tongue every 5 (five) minutes as needed for chest pain.     omeprazole (PRILOSEC) 20 MG capsule Take 1 capsule by mouth once daily 90 capsule 0   ONETOUCH ULTRA test strip USE 1 STRIP TO CHECK GLUCOSE TWICE DAILY 100 each 0   Polyethyl Glycol-Propyl Glycol 0.4-0.3 % SOLN Place 1 drop into both eyes 3 (three) times daily as needed (for dry/irritated eyes.).     polyethylene glycol (MIRALAX / GLYCOLAX) 17 g packet Take 17 g by mouth daily as needed.     psyllium (METAMUCIL) 58.6 % packet Take 1 packet by mouth  daily as needed (for regularity).      triamcinolone cream (KENALOG) 0.1 % APPLY 1 APPLICATION TOPICALLY TWICE DAILY 30 g 0   warfarin (COUMADIN) 2 MG tablet Take 2 mg by mouth 2 (two) times a week. Take '2mg'$  at bedtime on Wednesday & Sunday.     warfarin (COUMADIN) 4 MG tablet Take 4 mg by mouth daily at 6 PM. Does not take Wednesday or Sunday.     calcitonin, salmon, (MIACALCIN/FORTICAL) 200 UNIT/ACT nasal spray Place 1 spray into alternate nostrils daily. 3.7 mL 0   lisinopril (ZESTRIL) 20 MG tablet Take 2 tablets by mouth twice daily (Patient not taking: Reported on 04/08/2022) 180 tablet 0   No facility-administered medications prior to visit.    Review of Systems;  Patient denies headache, fevers, malaise, unintentional weight loss, skin rash, eye pain, sinus congestion and sinus pain, sore throat, dysphagia,  hemoptysis , cough, dyspnea, wheezing, chest pain, palpitations, orthopnea, edema, abdominal pain, nausea, melena, diarrhea, constipation, flank pain, dysuria, hematuria, urinary  Frequency, nocturia, numbness, tingling, seizures,  Focal weakness, Loss of consciousness,  Tremor, insomnia, depression, anxiety, and suicidal ideation.      Objective:  BP (!) 156/62 (BP Location: Left Arm, Patient Position: Sitting, Cuff Size: Normal)   Pulse 67   Temp (!) 97.4 F (36.3 C) (Oral)   Ht '5\' 6"'$  (1.676 m)   Wt 117 lb 3.2 oz (53.2 kg)   SpO2 98%   BMI 18.92 kg/m   BP Readings from Last 3 Encounters:  04/08/22 (!) 156/62  10/29/21 (!) 168/76  09/01/21 (!) 142/62    Wt Readings from Last 3 Encounters:  04/08/22 117 lb 3.2 oz (53.2 kg)  10/29/21 121 lb 12.8 oz (55.2 kg)  09/01/21 123 lb 6.4 oz (56 kg)    General appearance: alert, cooperative and appears stated age Ears: normal TM's and external ear canals both ears Throat: lips, mucosa, and tongue normal; teeth and gums normal Neck: no adenopathy, no carotid bruit, supple, symmetrical, trachea midline and thyroid not  enlarged, symmetric, no tenderness/mass/nodules Back: symmetric, no curvature. ROM normal. No CVA tenderness. Chest wall:  tender ito palpation in the right lateral  chest wall,  without bruising  Lungs: clear to auscultation bilaterally Heart: regular rate and rhythm, S1, S2 normal, no murmur, click, rub or gallop Abdomen: soft, non-tender; bowel sounds normal; no masses,  no organomegaly Pulses: 2+ and symmetric Skin: Skin color, texture, turgor normal. No rashes or lesions Lymph nodes: Cervical, supraclavicular, and axillary nodes normal. Ext:  right foot without signs of trauma. Cellultiis or  inflammation.  Feet are perfused and warm.  Neuro:  awake  and interactive with normal mood and affect. Higher cortical functions are normal. Speech is clear without word-finding difficulty or dysarthria. Extraocular movements are intact. Visual fields of both eyes are grossly intact. Sensation to light touch is grossly intact bilaterally of upper and lower extremities. Motor examination shows 4+/5 symmetric hand grip and upper extremity and 5/5 lower extremity strength. There is no pronation or drift. Gait is non-ataxic   Lab Results  Component Value Date   HGBA1C 7.2 (A) 09/01/2021   HGBA1C 7.5 (H) 05/26/2021   HGBA1C 6.7 (H) 09/13/2020    Lab Results  Component Value Date   CREATININE 0.56 08/19/2021   CREATININE 0.67 07/14/2021   CREATININE 0.49 05/26/2021    Lab Results  Component Value Date   WBC 9.3 07/14/2021   HGB 15.1 (H) 07/14/2021   HCT 45.7 07/14/2021   PLT 220 07/14/2021   GLUCOSE 221 (H) 08/19/2021   CHOL 174 09/13/2020   TRIG 63.0 09/13/2020   HDL 60.10 09/13/2020   LDLDIRECT 116.0 10/30/2015   LDLCALC 101 (H) 09/13/2020   ALT 14 05/26/2021   AST 17 05/26/2021   NA 135 08/19/2021   K 4.5 08/19/2021   CL 98 08/19/2021   CREATININE 0.56 08/19/2021   BUN 10 08/19/2021   CO2 32 08/19/2021   TSH 0.66 05/26/2021   INR 2.0 (H) 07/14/2021   HGBA1C 7.2 (A) 09/01/2021    MICROALBUR 1.0 02/23/2020    MM 3D SCREEN BREAST BILATERAL  Result Date: 01/30/2022 CLINICAL DATA:  Screening. EXAM: DIGITAL SCREENING BILATERAL MAMMOGRAM WITH TOMOSYNTHESIS AND CAD TECHNIQUE: Bilateral screening digital craniocaudal and mediolateral oblique mammograms were obtained. Bilateral screening digital breast tomosynthesis was performed. The images were evaluated with computer-aided detection. COMPARISON:  Previous exam(s). ACR Breast Density Category b: There are scattered areas of fibroglandular density. FINDINGS: There are no findings suspicious for malignancy. IMPRESSION: No mammographic evidence of malignancy. A result letter of this screening mammogram will be mailed directly to the patient. RECOMMENDATION: Screening mammogram in one year. (Code:[id number]-B-01Y) BI-RADS CATEGORY  1: Negative. Electronically Signed   By: Margarette Canada M.D.   On: 01/30/2022 11:39    Assessment & Plan:   Problem List Items Addressed This Visit     Sick sinus syndrome [facility])    S/p pacemaker,  With most recent interrogation doe in October by [name].       Rib pain on right side - Primary    Repeating rib films to rule out pathologic fractures       Relevant Orders   DG Ribs Unilateral Right   Left foot pain    Involving the forefoot.  No signs of gout, ischemia  or trauma.       Invasive ductal carcinoma of breast, female, left ([place])    S/p resection in 2020 .  No other therapy planned due to age .  Advised to stop surveillance       Hyperlipidemia associated with type 2 diabetes mellitus ([place])   Relevant Orders   Comprehensive metabolic panel   DM type 2 with diabetic peripheral neuropathy ([place])    No treatment advised at  Last visit with a1c of 7.2  Repeat labs needed .   Lab Results  Component Value Date   HGBA1C 7.2 (A) 09/01/2021      Lab Results  Component Value Date   NA 135 08/19/2021   K 4.5 08/19/2021   CL 98 08/19/2021   CO2 32 08/19/2021  Relevant Orders    Hemoglobin A1c   Urine Microalbumin w/creat. ratio   RESOLVED: Closed rib fracture    Occurred in Feb 2023 after a fall,  Right side       Chronic atrial fibrillation ([place])    Controlled with metoprolol and pacemaker .Marland Kitchen  No changes today       Other Visit Diagnoses     Acute right flank pain       Relevant Orders   Urinalysis, Routine w reflex microscopic   Chronic fatigue       Relevant Orders   CBC with Differential/Platelet       I spent a total of  30  minutes with this patient in a face to face visit on the date of this encounter reviewing the last office visit with me . Her ,  most recent visit with cardiology ,   , home blood pressure /blod sugar readings, recent  and post visit ordering of testing and therapeutics.    Follow-up: Return in about 6 months (around 10/08/2022).   Crecencio Mc, MD

## 2022-04-08 NOTE — Assessment & Plan Note (Signed)
Controlled with metoprolol and pacemaker .Marland Kitchen  No changes today

## 2022-04-08 NOTE — Patient Instructions (Signed)
   I recommend using a heating pad alternating with ice for your side pain   Continue using tylenol as needed    I can make a referral to podiatry for your foot pain , just let me know

## 2022-04-08 NOTE — Assessment & Plan Note (Signed)
Repeating rib films to rule out pathologic fractures

## 2022-04-08 NOTE — Assessment & Plan Note (Signed)
Occurred in Feb 2023 after a fall,  Right side

## 2022-04-08 NOTE — Assessment & Plan Note (Addendum)
S/p resection in 2020 .  No other therapy planned due to age .  Advised to stop surveillance

## 2022-04-08 NOTE — Assessment & Plan Note (Signed)
Involving the forefoot.  No signs of gout, ischemia  or trauma.

## 2022-04-08 NOTE — Assessment & Plan Note (Signed)
S/p pacemaker,  With most recent interrogation doe in October by [name].

## 2022-04-08 NOTE — Assessment & Plan Note (Signed)
No treatment advised at  Last visit with a1c of 7.2  Repeat labs needed .   Lab Results  Component Value Date   HGBA1C 7.2 (A) 09/01/2021      Lab Results  Component Value Date   NA 135 08/19/2021   K 4.5 08/19/2021   CL 98 08/19/2021   CO2 32 08/19/2021

## 2022-04-09 LAB — CBC WITH DIFFERENTIAL/PLATELET
Basophils Absolute: 0 10*3/uL (ref 0.0–0.1)
Basophils Relative: 0.7 % (ref 0.0–3.0)
Eosinophils Absolute: 0.1 10*3/uL (ref 0.0–0.7)
Eosinophils Relative: 2.3 % (ref 0.0–5.0)
HCT: 42.2 % (ref 36.0–46.0)
Hemoglobin: 13.9 g/dL (ref 12.0–15.0)
Lymphocytes Relative: 22.2 % (ref 12.0–46.0)
Lymphs Abs: 1.2 10*3/uL (ref 0.7–4.0)
MCHC: 32.9 g/dL (ref 30.0–36.0)
MCV: 95.8 fl (ref 78.0–100.0)
Monocytes Absolute: 0.4 10*3/uL (ref 0.1–1.0)
Monocytes Relative: 8.3 % (ref 3.0–12.0)
Neutro Abs: 3.5 10*3/uL (ref 1.4–7.7)
Neutrophils Relative %: 66.5 % (ref 43.0–77.0)
Platelets: 178 10*3/uL (ref 150.0–400.0)
RBC: 4.4 Mil/uL (ref 3.87–5.11)
RDW: 13 % (ref 11.5–15.5)
WBC: 5.3 10*3/uL (ref 4.0–10.5)

## 2022-04-09 LAB — COMPREHENSIVE METABOLIC PANEL
ALT: 14 U/L (ref 0–35)
AST: 18 U/L (ref 0–37)
Albumin: 4 g/dL (ref 3.5–5.2)
Alkaline Phosphatase: 54 U/L (ref 39–117)
BUN: 11 mg/dL (ref 6–23)
CO2: 32 mEq/L (ref 19–32)
Calcium: 9.4 mg/dL (ref 8.4–10.5)
Chloride: 96 mEq/L (ref 96–112)
Creatinine, Ser: 0.59 mg/dL (ref 0.40–1.20)
GFR: 77.59 mL/min (ref >60.00)
Glucose, Bld: 153 mg/dL — ABNORMAL HIGH (ref 70–99)
Potassium: 3.8 mEq/L (ref 3.5–5.1)
Sodium: 135 mEq/L (ref 135–145)
Total Bilirubin: 0.6 mg/dL (ref 0.2–1.2)
Total Protein: 6.3 g/dL (ref 6.0–8.3)

## 2022-04-09 LAB — URINALYSIS, ROUTINE W REFLEX MICROSCOPIC
Bilirubin Urine: NEGATIVE
Hgb urine dipstick: NEGATIVE
Ketones, ur: NEGATIVE
Leukocytes,Ua: NEGATIVE
Nitrite: NEGATIVE
RBC / HPF: NONE SEEN (ref 0–?)
Specific Gravity, Urine: >=1.03 — AB (ref 1.000–1.030)
Total Protein, Urine: NEGATIVE
Urine Glucose: NEGATIVE
Urobilinogen, UA: 0.2 (ref 0.0–1.0)
pH: 5.5 (ref 5.0–8.0)

## 2022-04-09 LAB — HEMOGLOBIN A1C: Hgb A1c MFr Bld: 6.9 % — ABNORMAL HIGH (ref 4.6–6.5)

## 2022-04-09 LAB — MICROALBUMIN / CREATININE URINE RATIO
Creatinine,U: 92.3 mg/dL
Microalb Creat Ratio: 1.1 mg/g (ref 0.0–30.0)
Microalb, Ur: 1 mg/dL (ref 0.0–1.9)

## 2022-04-11 ENCOUNTER — Encounter: Payer: Self-pay | Admitting: Internal Medicine

## 2022-04-15 MED ORDER — TRAMADOL HCL 50 MG PO TABS: 50.0000 mg | ORAL_TABLET | Freq: Two times a day (BID) | ORAL | 0 refills | Status: AC | PRN

## 2022-04-22 DIAGNOSIS — Z7901 Long term (current) use of anticoagulants: Secondary | ICD-10-CM | POA: Diagnosis not present

## 2022-05-20 DIAGNOSIS — Z7901 Long term (current) use of anticoagulants: Secondary | ICD-10-CM | POA: Diagnosis not present

## 2022-05-21 ENCOUNTER — Other Ambulatory Visit: Payer: Self-pay

## 2022-05-21 ENCOUNTER — Emergency Department: Payer: Medicare Other

## 2022-05-21 ENCOUNTER — Inpatient Hospital Stay
Admission: EM | Admit: 2022-05-21 | Discharge: 2022-05-23 | DRG: 948 | Disposition: A | Discharge status: Home Health | Payer: Medicare Other | Attending: Obstetrics and Gynecology | Admitting: Obstetrics and Gynecology

## 2022-05-21 ENCOUNTER — Ambulatory Visit
Admission: EM | Admit: 2022-05-21 | Discharge: 2022-05-21 | Disposition: A | Discharge status: Home / Self Care | Payer: Medicare Other | Attending: Emergency Medicine | Admitting: Emergency Medicine

## 2022-05-21 DIAGNOSIS — I509 Heart failure, unspecified: Secondary | ICD-10-CM | POA: Diagnosis present

## 2022-05-21 DIAGNOSIS — Z803 Family history of malignant neoplasm of breast: Secondary | ICD-10-CM

## 2022-05-21 DIAGNOSIS — Z923 Personal history of irradiation: Secondary | ICD-10-CM

## 2022-05-21 DIAGNOSIS — K219 Gastro-esophageal reflux disease without esophagitis: Secondary | ICD-10-CM | POA: Diagnosis present

## 2022-05-21 DIAGNOSIS — Z885 Allergy status to narcotic agent status: Secondary | ICD-10-CM | POA: Diagnosis not present

## 2022-05-21 DIAGNOSIS — I110 Hypertensive heart disease with heart failure: Secondary | ICD-10-CM | POA: Diagnosis present

## 2022-05-21 DIAGNOSIS — Z90710 Acquired absence of both cervix and uterus: Secondary | ICD-10-CM | POA: Diagnosis not present

## 2022-05-21 DIAGNOSIS — I495 Sick sinus syndrome: Secondary | ICD-10-CM | POA: Diagnosis present

## 2022-05-21 DIAGNOSIS — Z888 Allergy status to other drugs, medicaments and biological substances status: Secondary | ICD-10-CM | POA: Diagnosis not present

## 2022-05-21 DIAGNOSIS — I6381 Other cerebral infarction due to occlusion or stenosis of small artery: Secondary | ICD-10-CM | POA: Diagnosis not present

## 2022-05-21 DIAGNOSIS — R4789 Other speech disturbances: Secondary | ICD-10-CM

## 2022-05-21 DIAGNOSIS — Z950 Presence of cardiac pacemaker: Secondary | ICD-10-CM | POA: Diagnosis not present

## 2022-05-21 DIAGNOSIS — R42 Dizziness and giddiness: Secondary | ICD-10-CM | POA: Diagnosis not present

## 2022-05-21 DIAGNOSIS — I2510 Atherosclerotic heart disease of native coronary artery without angina pectoris: Secondary | ICD-10-CM | POA: Diagnosis present

## 2022-05-21 DIAGNOSIS — Z8616 Personal history of COVID-19: Secondary | ICD-10-CM | POA: Diagnosis not present

## 2022-05-21 DIAGNOSIS — Z881 Allergy status to other antibiotic agents status: Secondary | ICD-10-CM | POA: Diagnosis not present

## 2022-05-21 DIAGNOSIS — R0789 Other chest pain: Secondary | ICD-10-CM

## 2022-05-21 DIAGNOSIS — R531 Weakness: Principal | ICD-10-CM

## 2022-05-21 DIAGNOSIS — Z87891 Personal history of nicotine dependence: Secondary | ICD-10-CM

## 2022-05-21 DIAGNOSIS — Z20822 Contact with and (suspected) exposure to covid-19: Secondary | ICD-10-CM | POA: Diagnosis present

## 2022-05-21 DIAGNOSIS — Z853 Personal history of malignant neoplasm of breast: Secondary | ICD-10-CM

## 2022-05-21 DIAGNOSIS — C50912 Malignant neoplasm of unspecified site of left female breast: Secondary | ICD-10-CM | POA: Diagnosis present

## 2022-05-21 DIAGNOSIS — Z880 Allergy status to penicillin: Secondary | ICD-10-CM | POA: Diagnosis not present

## 2022-05-21 DIAGNOSIS — F419 Anxiety disorder, unspecified: Secondary | ICD-10-CM | POA: Diagnosis present

## 2022-05-21 DIAGNOSIS — I482 Chronic atrial fibrillation, unspecified: Secondary | ICD-10-CM | POA: Diagnosis present

## 2022-05-21 DIAGNOSIS — E785 Hyperlipidemia, unspecified: Secondary | ICD-10-CM | POA: Diagnosis present

## 2022-05-21 DIAGNOSIS — E1142 Type 2 diabetes mellitus with diabetic polyneuropathy: Secondary | ICD-10-CM | POA: Diagnosis present

## 2022-05-21 DIAGNOSIS — Z7901 Long term (current) use of anticoagulants: Secondary | ICD-10-CM | POA: Diagnosis not present

## 2022-05-21 DIAGNOSIS — R4182 Altered mental status, unspecified: Secondary | ICD-10-CM | POA: Diagnosis not present

## 2022-05-21 DIAGNOSIS — G250 Essential tremor: Secondary | ICD-10-CM | POA: Diagnosis present

## 2022-05-21 DIAGNOSIS — R4701 Aphasia: Secondary | ICD-10-CM | POA: Diagnosis present

## 2022-05-21 DIAGNOSIS — I10 Essential (primary) hypertension: Secondary | ICD-10-CM | POA: Diagnosis present

## 2022-05-21 DIAGNOSIS — Z8673 Personal history of transient ischemic attack (TIA), and cerebral infarction without residual deficits: Secondary | ICD-10-CM

## 2022-05-21 LAB — CBC WITH DIFFERENTIAL/PLATELET
Abs Immature Granulocytes: 0.02 10*3/uL (ref 0.00–0.07)
Basophils Absolute: 0 10*3/uL (ref 0.0–0.1)
Basophils Relative: 1 %
Eosinophils Absolute: 0.1 10*3/uL (ref 0.0–0.5)
Eosinophils Relative: 3 %
HCT: 44.7 % (ref 36.0–46.0)
Hemoglobin: 14.8 g/dL (ref 12.0–15.0)
Immature Granulocytes: 0 %
Lymphocytes Relative: 17 %
Lymphs Abs: 0.9 10*3/uL (ref 0.7–4.0)
MCH: 31.8 pg (ref 26.0–34.0)
MCHC: 33.1 g/dL (ref 30.0–36.0)
MCV: 95.9 fL (ref 80.0–100.0)
Monocytes Absolute: 0.4 10*3/uL (ref 0.1–1.0)
Monocytes Relative: 8 %
Neutro Abs: 4 10*3/uL (ref 1.7–7.7)
Neutrophils Relative %: 71 %
Platelets: 209 10*3/uL (ref 150–400)
RBC: 4.66 MIL/uL (ref 3.87–5.11)
RDW: 12.6 % (ref 11.5–15.5)
WBC: 5.6 10*3/uL (ref 4.0–10.5)
nRBC: 0 % (ref 0.0–0.2)

## 2022-05-21 LAB — URINALYSIS, ROUTINE W REFLEX MICROSCOPIC
Bilirubin Urine: NEGATIVE
Glucose, UA: NEGATIVE mg/dL
Hgb urine dipstick: NEGATIVE
Ketones, ur: NEGATIVE mg/dL
Leukocytes,Ua: NEGATIVE
Nitrite: NEGATIVE
Protein, ur: NEGATIVE mg/dL
Specific Gravity, Urine: 1.008 (ref 1.005–1.030)
pH: 7 (ref 5.0–8.0)

## 2022-05-21 LAB — LACTIC ACID, PLASMA: Lactic Acid, Venous: 1.3 mmol/L (ref 0.5–1.9)

## 2022-05-21 LAB — RESP PANEL BY RT-PCR (FLU A&B, COVID) ARPGX2
Influenza A by PCR: NEGATIVE
Influenza B by PCR: NEGATIVE
SARS Coronavirus 2 by RT PCR: NEGATIVE

## 2022-05-21 LAB — TSH
TSH: 0.628 u[IU]/mL (ref 0.350–4.500)
TSH: 0.752 u[IU]/mL (ref 0.350–4.500)

## 2022-05-21 LAB — TROPONIN I (HIGH SENSITIVITY)
Troponin I (High Sensitivity): 6 ng/L (ref <18)
Troponin I (High Sensitivity): 7 ng/L (ref <18)

## 2022-05-21 LAB — PROTIME-INR
INR: 2 — ABNORMAL HIGH (ref 0.8–1.2)
Prothrombin Time: 22.6 seconds — ABNORMAL HIGH (ref 11.4–15.2)

## 2022-05-21 LAB — GLUCOSE, CAPILLARY: Glucose-Capillary: 176 mg/dL — ABNORMAL HIGH (ref 70–99)

## 2022-05-21 LAB — MAGNESIUM: Magnesium: 1 mg/dL — ABNORMAL LOW (ref 1.7–2.4)

## 2022-05-21 LAB — APTT: aPTT: 42 seconds — ABNORMAL HIGH (ref 24–36)

## 2022-05-21 LAB — CBG MONITORING, ED: Glucose-Capillary: 141 mg/dL — ABNORMAL HIGH (ref 70–99)

## 2022-05-21 LAB — T4, FREE: Free T4: 0.91 ng/dL (ref 0.61–1.12)

## 2022-05-21 MED ORDER — IPRATROPIUM BROMIDE 0.03 % NA SOLN: 2.0000 | Freq: Two times a day (BID) | NASAL | Status: DC

## 2022-05-21 MED ORDER — WARFARIN SODIUM 4 MG PO TABS
4.0000 mg | ORAL_TABLET | Freq: Once | ORAL | Status: AC
  Administered 2022-05-21: 4 mg via ORAL
  Filled 2022-05-21: qty 1

## 2022-05-21 MED ORDER — WARFARIN - PHARMACIST DOSING INPATIENT
Freq: Every day | Status: DC
  Filled 2022-05-21: qty 1

## 2022-05-21 MED ORDER — NITROGLYCERIN 0.4 MG SL SUBL: 0.4000 mg | SUBLINGUAL_TABLET | SUBLINGUAL | Status: DC | PRN

## 2022-05-21 MED ORDER — SODIUM CHLORIDE 0.9% FLUSH
3.0000 mL | Freq: Two times a day (BID) | INTRAVENOUS | Status: DC
  Administered 2022-05-21 – 2022-05-23 (×4): 3 mL via INTRAVENOUS

## 2022-05-21 MED ORDER — HYDRALAZINE HCL 20 MG/ML IJ SOLN: 10.0000 mg | INTRAMUSCULAR | Status: DC | PRN

## 2022-05-21 MED ORDER — CALCIUM GLUCONATE-NACL 1-0.675 GM/50ML-% IV SOLN
1.0000 g | Freq: Once | INTRAVENOUS | Status: AC
  Filled 2022-05-21: qty 50

## 2022-05-21 MED ORDER — SODIUM CHLORIDE 0.9 % IV SOLN: INTRAVENOUS | Status: DC

## 2022-05-21 MED ORDER — CALCIUM GLUCONATE-NACL 1-0.675 GM/50ML-% IV SOLN
INTRAVENOUS | Status: AC
  Administered 2022-05-21: 1000 mg via INTRAVENOUS
  Filled 2022-05-21: qty 50

## 2022-05-21 MED ORDER — ATENOLOL 25 MG PO TABS
25.0000 mg | ORAL_TABLET | Freq: Two times a day (BID) | ORAL | Status: DC
  Administered 2022-05-21 – 2022-05-23 (×4): 25 mg via ORAL
  Filled 2022-05-21 (×4): qty 1

## 2022-05-21 MED ORDER — POTASSIUM CHLORIDE 10 MEQ/100ML IV SOLN
10.0000 meq | Freq: Once | INTRAVENOUS | Status: AC
  Administered 2022-05-21: 10 meq via INTRAVENOUS
  Filled 2022-05-21: qty 100

## 2022-05-21 MED ORDER — ACETAMINOPHEN 325 MG PO TABS: 650.0000 mg | ORAL_TABLET | Freq: Four times a day (QID) | ORAL | Status: DC | PRN

## 2022-05-21 NOTE — ED Notes (Signed)
Per MD Posey Pronto no need for redraw of LA since normal.

## 2022-05-21 NOTE — ED Provider Notes (Addendum)
[facility] Provider Note    Event Date/Time   First MD Initiated Contact with Patient 05/21/22 1501     (approximate)   History   Weakness (Patient reports generalized weakness, altered gait, and an episode of increased confusion and garbled speech at 10:00 this AM; Was seen at [facility] and advised to come to ED; No h/o of CVA or TIA; On Coumadin for A-fib)   HPI  Leah Holmes is a 88 y.o. female with a history of chronic Coumadin, CHF, diabetes, hypertension, hyperlipidemia, and chronic tremor who presents with generalized weakness and an episode of abnormal speech.  The patient states that since she got up this morning she has been feeling generally weak and shaky.  She denies dizziness or lightheadedness, shortness of breath, nausea or vomiting, or any acute pain.  She does have a mild headache.  The patient's daughter states that this morning the patient appeared somewhat confused and weak and then there was a brief episode of a few minutes where it seemed like she was having difficulty forming her words.  This then resolved and the patient is at her baseline mental status now.  I reviewed the past medical records.  The patient's most recent outpatient encounter was on 10/25 with primary care for foot pain and hypertension.  She was last seen in the ED in March and has no recent admissions.   Physical Exam   Triage Vital Signs: ED Triage Vitals  Enc Vitals Group     BP 05/21/22 1319 (!) 182/85     Pulse Rate 05/21/22 1319 69     Resp 05/21/22 1319 18     Temp 05/21/22 1319 (!) 97.5 F (36.4 C)     Temp Source 05/21/22 1319 Oral     SpO2 05/21/22 1319 99 %     Weight --      Height --      Head Circumference --      Peak Flow --      Pain Score 05/21/22 1313 0     Pain Loc --      Pain Edu? --      Excl. in [place]? --     Most recent vital signs: Vitals:   05/21/22 1458 05/21/22 1649  BP: (!) 194/88 (!) 186/82  Pulse: 71 76  Resp: 18 17   Temp: 97.7 F (36.5 C) 98.2 F (36.8 C)  SpO2: 96% 98%     General: Alert and oriented, no acute distress. CV:  Good peripheral perfusion.  Resp:  Normal effort.  Abd:  No distention.  Other:  EOMI.  PERRLA.  No facial droop.  Motor and sensory intact in all extremities.  Moderate tremor on finger-to-nose which patient states is chronic.  No pronator drift.   ED Results / Procedures / Treatments   Labs (all labs ordered are listed, but only abnormal results are displayed) Labs Reviewed  PROTIME-INR - Abnormal; Notable for the following components:      Result Value   Prothrombin Time 22.6 (*)    INR 2.0 (*)    All other components within normal limits  APTT - Abnormal; Notable for the following components:   aPTT 42 (*)    All other components within normal limits  COMPREHENSIVE METABOLIC PANEL - Abnormal; Notable for the following components:   Potassium <2.0 (*)    Chloride 125 (*)    CO2 16 (*)    Creatinine, Ser <0.30 (*)  Calcium <4.0 (*)    Total Protein 3.1 (*)    Albumin 1.7 (*)    AST 14 (*)    Alkaline Phosphatase 26 (*)    Anion gap <0 (*)    All other components within normal limits  URINALYSIS, ROUTINE W REFLEX MICROSCOPIC - Abnormal; Notable for the following components:   Color, Urine STRAW (*)    APPearance CLEAR (*)    All other components within normal limits  MAGNESIUM - Abnormal; Notable for the following components:   Magnesium 1.0 (*)    All other components within normal limits  CBG MONITORING, ED - Abnormal; Notable for the following components:   Glucose-Capillary 141 (*)    All other components within normal limits  RESP PANEL BY RT-PCR (FLU A&B, COVID) ARPGX2  CBC WITH DIFFERENTIAL/PLATELET  TSH  TROPONIN I (HIGH SENSITIVITY)  TROPONIN I (HIGH SENSITIVITY)     EKG  ED ECG REPORT I, Arta Silence, the attending physician, personally viewed and interpreted this ECG.  Date: 05/21/2022 EKG Time: 1336 Rate: 61 Rhythm:  Ventricular paced rhythm ST/T Wave abnormalities: normal Narrative Interpretation: no evidence of acute ischemia    RADIOLOGY  CT head: I independently viewed and interpreted the images; there is no ICH.  Radiology report indicates no acute abnormalities.   PROCEDURES:  Critical Care performed: Yes  .Critical Care  Performed by: Arta Silence, MD Authorized by: Arta Silence, MD   Critical care provider statement:    Critical care time (minutes):  30   Critical care time was exclusive of:  Separately billable procedures and treating other patients   Critical care was necessary to treat or prevent imminent or life-threatening deterioration of the following conditions:  Metabolic crisis   Critical care was time spent personally by me on the following activities:  Interpretation of cardiac output measurements, ordering and performing treatments and interventions, development of treatment plan with patient or surrogate, evaluation of patient's response to treatment, examination of patient, ordering and review of laboratory studies, re-evaluation of patient's condition and review of old charts   Care discussed with: admitting provider      MEDICATIONS ORDERED IN ED: Medications  potassium chloride 10 mEq in 100 mL IVPB (10 mEq Intravenous New Bag/Given 05/21/22 1846)  calcium gluconate 1 g/ 50 mL sodium chloride IVPB (has no administration in time range)     IMPRESSION / MDM / [place] / ED COURSE  I reviewed the triage vital signs and the nursing notes.  88 year old female with PMH as noted above presents with generalized weakness over the course of the day today as well as an episode of altered mental status and possible aphasia that has now resolved.  The patient is tremulous.  She states that this is chronic but somewhat worse than normal.  Neurologic exam is otherwise normal.  The vital signs are normal.  Differential diagnosis includes, but is not  limited to, TIA, dehydration, electrolyte abnormality, AKI, other metabolic disturbance, UTI or other infection.  CT head obtained from triage shows no acute findings.  We will obtain lab workup and reassess.  Patient's presentation is most consistent with acute presentation with potential threat to life or bodily function.  The patient is on the cardiac monitor to evaluate for evidence of arrhythmia and/or significant heart rate changes.  ----------------------------------------- 7:03 PM on 05/21/2022 -----------------------------------------  Blood chemistry shows significant abnormalities with particularly low potassium and calcium.  The patient had normal electrolytes 1 month ago.  The lab initially  believed that this was a spurious reading and so redrew a new sample and reading ran it with the same result.  The extremely low results are not consistent with the patient's clinical status; she is overall relatively well-appearing with a normal exam except for tremor and her only symptom at this time is generalized weakness.  Her vital signs are normal and the EKG does not show any significant acute abnormalities.  Given that there have now been to test with the same result I will replete potassium and calcium.  I recommended that we admit the patient for further electrolyte repletion, additional workup as needed, and serial blood chemistries.  I consulted Dr. Posey Pronto from the hospitalist service; based our discussion she agrees to admit the patient.   FINAL CLINICAL IMPRESSION(S) / ED DIAGNOSES   Final diagnoses:  Generalized weakness     Rx / DC Orders   ED Discharge Orders     None        Note:  This document was prepared using Dragon voice recognition software and may include unintentional dictation errors.    Arta Silence, MD 05/21/22 Romana Juniper, MD 05/21/22 1907

## 2022-05-21 NOTE — ED Triage Notes (Signed)
Patient reports generalized weakness, altered gait, and an episode of increased confusion and garbled speech at 10:00 this AM; Was seen at [facility] and advised to come to ED; No h/o of CVA or TIA; On Coumadin for A-fib

## 2022-05-21 NOTE — ED Triage Notes (Signed)
Patient presents to Urgent Care with complaints of weakness x 2 days, dizziness, SOB, and change in mental status since today. Daughter states she has a hx of SOB, CHF, DM.   Denies chest pain.

## 2022-05-21 NOTE — H&P (Signed)
History and Physical    Chief Complaint: Generalized weakness.   HISTORY OF PRESENT ILLNESS: Leah Holmes is an 88 y.o. female brought to the hospital for altered mental status.  Patient developed slurred speech and some transient patient is alert awake answers questions has benign essential tremor of the neck.  Is very weak and shaky.  Daughter is helping her from wheelchair to the bed.  Episode of AMS then brief few minutes of aphasia.  Neuro symptoms has resolved.  Labs are abnormal. Tremor in her upper extremity.  On arrival patient was noted to be febrile, pt has  Past Medical History:  Diagnosis Date   Atrial fibrillation ([place]) 2012   Breast cancer ([place]) 04/2018   IDC of left breast    CHF (congestive heart failure) ([place])    Colon polyp 2003   Compression fracture of L2 lumbar vertebra (HCC)    Coronary artery disease    COVID-19 virus infection 12/25/2019   Diabetes mellitus without complication (HCC)    Dyspnea    Dysrhythmia    atrial fib   Environmental and seasonal allergies    GERD (gastroesophageal reflux disease)    Hyperlipidemia    Hypertension    Hypertensive urgency 03/10/2021   Irritable bowel syndrome    Multiple rib fractures 03/15/2018   Personal history of radiation therapy    Polyp of colon    PONV (postoperative nausea and vomiting)    with ether, not recently "woke up in surgery once"   Presence of permanent cardiac pacemaker    Tremor    Tremors of nervous system    Ulcer    Review of Systems  HENT:  Negative for sore throat.   Respiratory:  Negative for choking.   Gastrointestinal:  Negative for anal bleeding.  Psychiatric/Behavioral:  Negative for behavioral problems.    Allergies  Allergen Reactions   Codeine Anaphylaxis   Penicillins Anaphylaxis    Has patient had a PCN reaction causing immediate rash, facial/tongue/throat swelling, SOB or lightheadedness with hypotension: Yes Has patient had a PCN reaction causing  severe rash involving mucus membranes or skin necrosis: No Has patient had a PCN reaction that required hospitalization Yes Has patient had a PCN reaction occurring within the last 10 years: No If all of the above answers are "NO", then may proceed with Cephalosporin use.   Amlodipine Other (See Comments)    Diffuse itching    Atorvastatin     Other reaction(s): Unknown   Clonidine Derivatives Other (See Comments)    Reaction:  Unknown    Gabapentin Nausea Only   Losartan Other (See Comments)    Other reaction(s): Other (See Comments) Reaction:  Muscle aches  Reaction:  Muscle aches    Morphine     Other reaction(s): Unknown   Morphine And Related Other (See Comments)    Reaction:  Unknown    Reglan [Metoclopramide] Other (See Comments)    Reaction:  Tremors    Valtrex [Valacyclovir] Other (See Comments)    Tremors    Hydralazine Anxiety   Spironolactone Itching   Past Surgical History:  Procedure Laterality Date   ABDOMINAL ADHESION SURGERY     ABDOMINAL HYSTERECTOMY     APPENDECTOMY     BREAST BIOPSY Right late 80s/early 90s   benign   BREAST BIOPSY Left 03/31/2018   [place] bx done with Dr. Bary Castilla, invasive ductal carcinoma   BREAST LUMPECTOMY Left 04/22/2018   Procedure: BREAST LUMPECTOMY;  Surgeon: Robert Bellow, MD;  Location: [facility] ORS;  Service: General;  Laterality: Left;   BREAST LUMPECTOMY Left 04/22/2018   [facility], negative LN, clear margins   BREAST LUMPECTOMY Left 2020   positive, done in office   BREAST SURGERY Right    biopsy    CESAREAN SECTION     x 2   CHOLECYSTECTOMY  03/2012   Dr Burt Knack   COLONOSCOPY  2003   Dr Theodosia Paling in [place]   fibroid tumor     [place] N/A 01/28/2016   Procedure: INSERTION PACEMAKER;  Surgeon: Isaias Cowman, MD;  Location: [facility] ORS;  Service: Cardiovascular;  Laterality: N/A;   SENTINEL NODE BIOPSY Left 04/22/2018   Procedure: SENTINEL NODE BIOPSY;  Surgeon: Robert Bellow, MD;  Location: [facility] ORS;   Service: General;  Laterality: Left;   UPPER GI ENDOSCOPY  2003   Dr Theodosia Paling in [place]: Current Outpatient Medications  Medication Instructions   acetaminophen (TYLENOL) 650 mg, Oral, Every 6 hours PRN   albuterol (PROAIR HFA) 108 (90 Base) MCG/ACT inhaler 1-2 puffs, Inhalation, Every 4 hours PRN   amLODipine (NORVASC) 10 mg, Oral, Daily   atenolol (TENORMIN) 25 mg, Oral, 2 times daily   docusate sodium (COLACE) 200 mg, Oral, 2 times daily   glucose blood (ONE TOUCH ULTRA TEST) test strip USE 1 STRIP TO CHECK GLUCOSE TWICE DAILY  Dx code: E11.9   glucose blood (ONE TOUCH ULTRA TEST) test strip USE ONE STRIP TO CHECK GLUCOSE ONCE  DAILY   glucose blood test strip Use to check blood sugars once daily.   hydrochlorothiazide (HYDRODIURIL) 12.5 mg, Oral, Daily   ipratropium (ATROVENT) 0.03 % nasal spray 2 sprays, Each Nare, Every 12 hours   isosorbide mononitrate (IMDUR) 60 mg, Oral, 2 times daily   Lancets (ONETOUCH DELICA PLUS NWGNFA21H) MISC 2 Devices, Buccal, Daily, DX Code E11.9.   lisinopril (ZESTRIL) 40 mg, Oral, 2 times daily   meclizine (ANTIVERT) 25 MG tablet Take 1 tablet by mouth three times daily as needed   miconazole (MICOTIN) 2 % cream 1 application , Topical, 2 times daily   mometasone (ELOCON) 0.1 % cream Apply once ot twice daily as needed for itching   nitroGLYCERIN (NITROSTAT) 0.4 mg, Sublingual, Every 5 min PRN   omeprazole (PRILOSEC) 20 MG capsule Take 1 capsule by mouth once daily   ONETOUCH ULTRA test strip USE 1 STRIP TO CHECK GLUCOSE TWICE DAILY   Polyethyl Glycol-Propyl Glycol 0.4-0.3 % SOLN 1 drop, Both Eyes, 3 times daily PRN   polyethylene glycol (MIRALAX / GLYCOLAX) 17 g, Oral, Daily PRN   psyllium (METAMUCIL) 58.6 % packet 1 packet, Oral, Daily PRN   triamcinolone cream (KENALOG) 0.1 % APPLY 1 APPLICATION TOPICALLY TWICE DAILY   Vitamin D3 1,000 Units, Oral, BH-each morning   warfarin (COUMADIN) 4 mg, Oral, Daily at bedtime, Does not  take Wednesday or Sunday.   warfarin (COUMADIN) 2 mg, Oral, 2 times weekly, Take '2mg'$  at bedtime on Wednesday & Sunday.     atenolol  25 mg Oral BID   sodium chloride flush  3 mL Intravenous Q12H   Warfarin - Pharmacist Dosing Inpatient   Does not apply q1600     sodium chloride 50 mL/hr at 05/21/22 2102        ED Course: Pt in Ed alert awake Vitals:   05/21/22 2108 05/21/22 2314 05/22/22 0020 05/22/22 0030  BP: (!) 151/69 (!) 129/56  (!) 159/67  Pulse: 60 60  63  Resp: '20 18  18  '$ Temp:   98.5 F (36.9 C)   TempSrc:   Oral   SpO2: 97% 97%  97%  Weight:      Height:        Total I/O In: 100 [IV Piggyback:100] Out: -  SpO2: 97 % Blood work in ed shows: Results for orders placed or performed during the hospital encounter of 05/21/22 (from the past 24 hour(s))  CBC with Differential     Status: None   Collection Time: 05/21/22  1:39 PM  Result Value Ref Range   WBC 5.6 4.0 - 10.5 K/uL   RBC 4.66 3.87 - 5.11 MIL/uL   Hemoglobin 14.8 12.0 - 15.0 g/dL   HCT 44.7 36.0 - 46.0 %   MCV 95.9 80.0 - 100.0 fL   MCH 31.8 26.0 - 34.0 pg   MCHC 33.1 30.0 - 36.0 g/dL   RDW 12.6 11.5 - 15.5 %   Platelets 209 150 - 400 K/uL   nRBC 0.0 0.0 - 0.2 %   Neutrophils Relative % 71 %   Neutro Abs 4.0 1.7 - 7.7 K/uL   Lymphocytes Relative 17 %   Lymphs Abs 0.9 0.7 - 4.0 K/uL   Monocytes Relative 8 %   Monocytes Absolute 0.4 0.1 - 1.0 K/uL   Eosinophils Relative 3 %   Eosinophils Absolute 0.1 0.0 - 0.5 K/uL   Basophils Relative 1 %   Basophils Absolute 0.0 0.0 - 0.1 K/uL   Immature Granulocytes 0 %   Abs Immature Granulocytes 0.02 0.00 - 0.07 K/uL  Protime-INR     Status: Abnormal   Collection Time: 05/21/22  2:05 PM  Result Value Ref Range   Prothrombin Time 22.6 (H) 11.4 - 15.2 seconds   INR 2.0 (H) 0.8 - 1.2  APTT     Status: Abnormal   Collection Time: 05/21/22  2:05 PM  Result Value Ref Range   aPTT 42 (H) 24 - 36 seconds  Comprehensive metabolic panel     Status: Abnormal    Collection Time: 05/21/22  2:05 PM  Result Value Ref Range   Sodium 139 135 - 145 mmol/L   Potassium <2.0 (LL) 3.5 - 5.1 mmol/L   Chloride 125 (H) 98 - 111 mmol/L   CO2 16 (L) 22 - 32 mmol/L   Glucose, Bld 75 70 - 99 mg/dL   BUN 8 8 - 23 mg/dL   Creatinine, Ser <0.30 (L) 0.44 - 1.00 mg/dL   Calcium <4.0 (LL) 8.9 - 10.3 mg/dL   Total Protein 3.1 (L) 6.5 - 8.1 g/dL   Albumin 1.7 (L) 3.5 - 5.0 g/dL   AST 14 (L) 15 - 41 U/L   ALT 9 0 - 44 U/L   Alkaline Phosphatase 26 (L) 38 - 126 U/L   Total Bilirubin 0.4 0.3 - 1.2 mg/dL   GFR, Estimated NOT CALCULATED >60 mL/min   Anion gap <0 (L) 5 - 15  Magnesium     Status: Abnormal   Collection Time: 05/21/22  2:05 PM  Result Value Ref Range   Magnesium 1.0 (L) 1.7 - 2.4 mg/dL  CBG monitoring, ED     Status: Abnormal   Collection Time: 05/21/22  3:53 PM  Result Value Ref Range   Glucose-Capillary 141 (H) 70 - 99 mg/dL  Resp Panel by RT-PCR (Flu A&B, Covid) Anterior Nasal Swab     Status: None   Collection Time: 05/21/22  4:05 PM   Specimen: Anterior Nasal Swab  Result Value Ref Range   SARS Coronavirus 2 by RT PCR NEGATIVE NEGATIVE   Influenza A by PCR NEGATIVE NEGATIVE   Influenza B by PCR NEGATIVE NEGATIVE  TSH     Status: None   Collection Time: 05/21/22  4:05 PM  Result Value Ref Range   TSH 0.628 0.350 - 4.500 uIU/mL  Troponin I (High Sensitivity)     Status: None   Collection Time: 05/21/22  4:07 PM  Result Value Ref Range   Troponin I (High Sensitivity) 6 <18 ng/L  Urinalysis, Routine w reflex microscopic Urine, Clean Catch     Status: Abnormal   Collection Time: 05/21/22  4:30 PM  Result Value Ref Range   Color, Urine STRAW (A) YELLOW   APPearance CLEAR (A) CLEAR   Specific Gravity, Urine 1.008 1.005 - 1.030   pH 7.0 5.0 - 8.0   Glucose, UA NEGATIVE NEGATIVE mg/dL   Hgb urine dipstick NEGATIVE NEGATIVE   Bilirubin Urine NEGATIVE NEGATIVE   Ketones, ur NEGATIVE NEGATIVE mg/dL   Protein, ur NEGATIVE NEGATIVE mg/dL    Nitrite NEGATIVE NEGATIVE   Leukocytes,Ua NEGATIVE NEGATIVE  Troponin I (High Sensitivity)     Status: None   Collection Time: 05/21/22  8:54 PM  Result Value Ref Range   Troponin I (High Sensitivity) 7 <18 ng/L  TSH     Status: None   Collection Time: 05/21/22  8:54 PM  Result Value Ref Range   TSH 0.752 0.350 - 4.500 uIU/mL  T4, free     Status: None   Collection Time: 05/21/22  8:54 PM  Result Value Ref Range   Free T4 0.91 0.61 - 1.12 ng/dL  Lactic acid, plasma     Status: None   Collection Time: 05/21/22  8:54 PM  Result Value Ref Range   Lactic Acid, Venous 1.3 0.5 - 1.9 mmol/L    Unresulted Labs (From admission, onward)     Start     Ordered   05/22/22 0500  Comprehensive metabolic panel  Tomorrow morning,   STAT        05/21/22 1912   05/22/22 0500  CBC  Tomorrow morning,   STAT        05/21/22 1912   05/22/22 0500  Protime-INR  Tomorrow morning,   R        05/21/22 2020   05/21/22 1906  Lactic acid, plasma  STAT Now then every 3 hours,   STAT      05/21/22 1912           Pt has received : Orders Placed This Encounter  Procedures   Critical Care    This order was created via procedure documentation    Standing Status:   Standing    Number of Occurrences:   1   Resp Panel by RT-PCR (Flu A&B, Covid) Anterior Nasal Swab    Standing Status:   Standing    Number of Occurrences:   1   CT HEAD WO CONTRAST    CT Head - for Non-Code Stroke Patient    Standing Status:   Standing    Number of Occurrences:   1    Order Specific Question:   Radiology Contrast Protocol - do NOT remove file path    Answer:   [email or web address]   CBC with Differential    Standing Status:   Standing    Number of Occurrences:   1   Protime-INR    Standing Status:   Standing  Number of Occurrences:   1   APTT    Standing Status:   Standing    Number of Occurrences:   1   Comprehensive metabolic panel    Standing Status:   Standing    Number  of Occurrences:   1   Urinalysis, Routine w reflex microscopic    Standing Status:   Standing    Number of Occurrences:   1   TSH    Standing Status:   Standing    Number of Occurrences:   1   Magnesium    Standing Status:   Standing    Number of Occurrences:   1   Comprehensive metabolic panel    Standing Status:   Standing    Number of Occurrences:   1   CBC    Standing Status:   Standing    Number of Occurrences:   1   TSH    Standing Status:   Standing    Number of Occurrences:   1   T4, free    Standing Status:   Standing    Number of Occurrences:   1   Lactic acid, plasma    Standing Status:   Standing    Number of Occurrences:   2   Protime-INR    Standing Status:   Standing    Number of Occurrences:   1   Diet regular Room service appropriate? Yes; Fluid consistency: Thin    Standing Status:   Standing    Number of Occurrences:   1    Order Specific Question:   Room service appropriate?    Answer:   Yes    Order Specific Question:   Fluid consistency:    Answer:   Thin   NIH Stroke Scale    Standing Status:   Standing    Number of Occurrences:   1   Modified Stroke Scale (mNIHSS) Document mNIHSS assessment every 2 hours for a total of 12 hours    Document mNIHSS assessment every 2 hours for a total of 12 hours    Standing Status:   Standing    Number of Occurrences:   1   Maintain IV access    Standing Status:   Standing    Number of Occurrences:   1   Vital signs    Standing Status:   Standing    Number of Occurrences:   1   Notify physician (specify)    Standing Status:   Standing    Number of Occurrences:   20    Order Specific Question:   Notify Physician    Answer:   for pulse less than 55 or greater than 120    Order Specific Question:   Notify Physician    Answer:   for respiratory rate less than 12 or greater than 25    Order Specific Question:   Notify Physician    Answer:   for temperature greater than 100.5 F    Order Specific Question:    Notify Physician    Answer:   for urinary output less than 30 mL/hr for four hours    Order Specific Question:   Notify Physician    Answer:   for systolic BP less than 90 or greater than 989, diastolic BP less than 60 or greater than 100    Order Specific Question:   Notify Physician    Answer:   for new hypoxia w/ oxygen saturations < 88%   Progressive  Mobility Protocol: No Restrictions    Standing Status:   Standing    Number of Occurrences:   1   Daily weights    Standing Status:   Standing    Number of Occurrences:   1   Intake and Output    Standing Status:   Standing    Number of Occurrences:   1   Initiate Oral Care Protocol    Standing Status:   Standing    Number of Occurrences:   1   Initiate Carrier Fluid Protocol    Standing Status:   Standing    Number of Occurrences:   1   RN may order General Admission PRN Orders utilizing "General Admission PRN medications" (through manage orders) for the following patient needs: allergy symptoms (Claritin), cold sores (Carmex), cough (Robitussin DM), eye irritation (Liquifilm Tears), hemorrhoids (Tucks), indigestion (Maalox), minor skin irritation (Hydrocortisone Cream), muscle pain Suezanne Jacquet Gay), nose irritation (saline nasal spray) and sore throat (Chloraseptic spray).    Standing Status:   Standing    Number of Occurrences:   [phone]   Cardiac Monitoring - Continuous Indefinite    Standing Status:   Standing    Number of Occurrences:   1    Order Specific Question:   Indications for use:    Answer:   Severe hypokalemia or hypomagnesemia   Full code    Standing Status:   Standing    Number of Occurrences:   1   Consult to hospitalist  [id number]    [id number]    Standing Status:   Standing    Number of Occurrences:   1    Order Specific Question:   Place call to:    Answer:   Hospitalist    Order Specific Question:   Reason for Consult    Answer:   Admit    Order Specific Question:   Diagnosis/Clinical Info for Consult:    Answer:   Gen  weakness, episode of AMS, possible electrolyte abnormalities   warfarin (COUMADIN) per pharmacy consult    Standing Status:   Standing    Number of Occurrences:   1    Order Specific Question:   Indication:    Answer:   Other (see comment)    Order Specific Question:   Comment:    Answer:   a.fib s/p pacemaker.   Consult to Registered Dietitian    Standing Status:   Standing    Number of Occurrences:   1    Order Specific Question:   Reason for consult?    Answer:   Assessment of nutrition requirements/status    Order Specific Question:   Reason for consult?    Answer:   Poor PO intake   Pulse oximetry check with vital signs    Standing Status:   Standing    Number of Occurrences:   1   Oxygen therapy Mode or (Route): Nasal cannula; Liters Per Minute: 2; Keep 02 saturation: greater than 92 %    Standing Status:   Standing    Number of Occurrences:   20    Order Specific Question:   Mode or (Route)    Answer:   Nasal cannula    Order Specific Question:   Liters Per Minute    Answer:   2    Order Specific Question:   Keep 02 saturation    Answer:   greater than 92 %   ED EKG    Standing Status:   Standing  Number of Occurrences:   1    Order Specific Question:   Reason for Exam    Answer:   Weakness   Place in observation (patient's expected length of stay will be less than 2 midnights)    Standing Status:   Standing    Number of Occurrences:   1    Order Specific Question:   Hospital Area    Answer:   [place] [100120]    Order Specific Question:   Level of Care    Answer:   Telemetry Cardiac [103]    Order Specific Question:   Covid Evaluation    Answer:   Asymptomatic - no recent exposure (last 10 days) testing not required    Order Specific Question:   Diagnosis    Answer:   Generalized weakness [970263]    Order Specific Question:   Admitting Physician    Answer:   Cherylann Ratel    Order Specific Question:   Attending Physician     Answer:   Cherylann Ratel   Aspiration precautions    Standing Status:   Standing    Number of Occurrences:   1   Fall precautions    Standing Status:   Standing    Number of Occurrences:   1    Meds ordered this encounter  Medications   potassium chloride 10 mEq in 100 mL IVPB   calcium gluconate 1 g/ 50 mL sodium chloride IVPB   atenolol (TENORMIN) tablet 25 mg   acetaminophen (TYLENOL) tablet 650 mg   nitroGLYCERIN (NITROSTAT) SL tablet 0.4 mg   DISCONTD: ipratropium (ATROVENT) 0.03 % nasal spray 2 spray   sodium chloride flush (NS) 0.9 % injection 3 mL   0.9 %  sodium chloride infusion   hydrALAZINE (APRESOLINE) injection 10 mg   warfarin (COUMADIN) tablet 4 mg   Warfarin - Pharmacist Dosing Inpatient   calcium gluconate in NaCl 1-0.675 GM/50ML-% IVPB    McCaffety, Katelyn E: cabinet override     Admission Imaging : CT HEAD WO CONTRAST  Result Date: 05/21/2022 CLINICAL DATA:  Altered mental status EXAM: CT HEAD WITHOUT CONTRAST TECHNIQUE: Contiguous axial images were obtained from the base of the skull through the vertex without intravenous contrast. RADIATION DOSE REDUCTION: This exam was performed according to the departmental dose-optimization program which includes automated exposure control, adjustment of the mA and/or kV according to patient size and/or use of iterative reconstruction technique. COMPARISON:  07/14/2021 FINDINGS: Brain: No evidence of acute infarction, hemorrhage, mass, mass effect, or midline shift. No hydrocephalus or extra-axial fluid collection. Periventricular white matter changes, likely the sequela of chronic small vessel ischemic disease. Age related cerebral atrophy. Right basal ganglia lacunar infarct. Vascular: No hyperdense vessel. Atherosclerotic calcifications in the intracranial carotid and vertebral arteries. Skull: Normal. Negative for fracture or focal lesion. Sinuses/Orbits: No acute finding. Status post left lens replacement. Other: The  mastoid air cells are well aerated. IMPRESSION: No acute intracranial process. Electronically Signed   By: Merilyn Baba M.D.   On: 05/21/2022 14:06    Physical Examination: Vitals:   05/21/22 2108 05/21/22 2314 05/22/22 0020 05/22/22 0030  BP: (!) 151/69 (!) 129/56  (!) 159/67  Pulse: 60 60  63  Temp:   98.5 F (36.9 C)   Resp: '20 18  18  '$ Height:      Weight:      SpO2: 97% 97%  97%  TempSrc:   Oral   BMI (Calculated):  Physical Exam Vitals and nursing note reviewed.  Constitutional:      General: She is not in acute distress.    Appearance: Normal appearance. She is not ill-appearing, toxic-appearing or diaphoretic.  HENT:     Head: Normocephalic and atraumatic.     Right Ear: Hearing and external ear normal.     Left Ear: Hearing and external ear normal.     Nose: Nose normal. No nasal deformity.     Mouth/Throat:     Lips: Pink.     Mouth: Mucous membranes are moist.     Tongue: No lesions.     Pharynx: Oropharynx is clear.  Eyes:     Extraocular Movements: Extraocular movements intact.     Pupils: Pupils are equal, round, and reactive to light.  Neck:     Vascular: No carotid bruit.  Cardiovascular:     Rate and Rhythm: Normal rate and regular rhythm.     Pulses: Normal pulses.     Heart sounds: Normal heart sounds.  Pulmonary:     Effort: Pulmonary effort is normal.     Breath sounds: Normal breath sounds.  Abdominal:     General: Bowel sounds are normal. There is no distension.     Palpations: Abdomen is soft. There is no mass.     Tenderness: There is no abdominal tenderness. There is no guarding.     Hernia: No hernia is present.  Musculoskeletal:     Right lower leg: No edema.     Left lower leg: No edema.  Skin:    General: Skin is warm.  Neurological:     General: No focal deficit present.     Mental Status: She is alert and oriented to person, place, and time.     Cranial Nerves: Cranial nerves 2-12 are intact.     Motor: Motor function is  intact.  Psychiatric:        Attention and Perception: Attention normal.        Mood and Affect: Mood normal.        Speech: Speech normal.        Behavior: Behavior normal. Behavior is cooperative.        Cognition and Memory: Cognition normal.     Assessment and Plan: * Generalized weakness Attribute to her recent COVID illness and her electrolytes. Patient will require aggressive physical therapy and rehab. Will admit her with fall precautions. Physical therapy Occupational Therapy..   Chronic atrial fibrillation (HCC) Currently patient in sinus rhythm.  Atenolol continued . Anticoagulation with Coumadin continued with pharmacy consult.    Latest Ref Rng & Units 05/21/2022    1:39 PM 04/08/2022    2:37 PM 07/14/2021    7:56 PM  CBC  WBC 4.0 - 10.5 K/uL 5.6  5.3  9.3   Hemoglobin 12.0 - 15.0 g/dL 14.8  13.9  15.1   Hematocrit 36.0 - 46.0 % 44.7  42.2  45.7   Platelets 150 - 400 K/uL 209  178.0  220      Essential hypertension, benign Vitals:   05/21/22 1319 05/21/22 1458 05/21/22 1649 05/21/22 2100  BP: (!) 182/85 (!) 194/88 (!) 186/82 (!) 151/69   05/21/22 2108 05/21/22 2314 05/22/22 0030  BP: (!) 151/69 (!) 129/56 (!) 159/67  As needed hydralazine every 4 hours, atenolol continued.    DM type 2 with diabetic peripheral neuropathy (HCC) Glycemic protocol.  CAD (coronary artery disease) Continue patient on atenolol, hydralazine, as needed nitroglycerin.  DVT prophylaxis:  Coumadin Code Status:  Full code Family Communication:  Margot Chimes: [phone]. Disposition Plan:  Home Consults called:  None Admission status: Observation Unit/ Expected LOS: Med/tele/2 days   Para Skeans MD [facility]  6 PM- 2 AM. Please contact me via secure Chat 6 PM-2 AM. [phone]( Pager ) To contact the [facility] Attending or Consulting provider [place] or covering provider during after hours [place], for this patient.   Check the care team in [facility] and look  for a) attending/consulting [facility] provider listed and b) the [facility] team listed Log into [email or web address] and use [place]'s universal password to access. If you do not have the password, please contact the hospital operator. Locate the [facility] provider you are looking for under [facility] and page to a number that you can be directly reached. If you still have difficulty reaching the provider, please page the [facility] (Director on Call) for the Hospitalists listed on amion for assistance. [email or web address] 05/22/2022, 1:02 AM

## 2022-05-21 NOTE — Progress Notes (Signed)
[place] for warfarin Indication: atrial fibrillation s/p pacemaker  Allergies  Allergen Reactions   Codeine Anaphylaxis   Penicillins Anaphylaxis    Has patient had a PCN reaction causing immediate rash, facial/tongue/throat swelling, SOB or lightheadedness with hypotension: Yes Has patient had a PCN reaction causing severe rash involving mucus membranes or skin necrosis: No Has patient had a PCN reaction that required hospitalization Yes Has patient had a PCN reaction occurring within the last 10 years: No If all of the above answers are "NO", then may proceed with Cephalosporin use.   Amlodipine Other (See Comments)    Diffuse itching    Atorvastatin     Other reaction(s): Unknown   Clonidine Derivatives Other (See Comments)    Reaction:  Unknown    Gabapentin Nausea Only   Losartan Other (See Comments)    Other reaction(s): Other (See Comments) Reaction:  Muscle aches  Reaction:  Muscle aches    Morphine     Other reaction(s): Unknown   Morphine And Related Other (See Comments)    Reaction:  Unknown    Reglan [Metoclopramide] Other (See Comments)    Reaction:  Tremors    Valtrex [Valacyclovir] Other (See Comments)    Tremors    Hydralazine Anxiety   Spironolactone Itching    Patient Measurements: Height: '5\' 6"'$  (167.6 cm) Weight: 53.2 kg (117 lb 4.6 oz) IBW/kg (Calculated) : 59.3  Vital Signs: Temp: 98.2 F (36.8 C) (12/07 1649) Temp Source: Oral (12/07 1458) BP: 186/82 (12/07 1649) Pulse Rate: 76 (12/07 1649)  Labs: Recent Labs    05/21/22 1339 05/21/22 1405 05/21/22 1607  HGB 14.8  --   --   HCT 44.7  --   --   PLT 209  --   --   APTT  --  42*  --   LABPROT  --  22.6*  --   INR  --  2.0*  --   CREATININE  --  <0.30*  --   TROPONINIHS  --   --  6    CrCl cannot be calculated (This lab value cannot be used to calculate CrCl because it is not a number: <0.30).   Medical History: Past Medical History:   Diagnosis Date   Atrial fibrillation ([place]) 2012   Breast cancer ([place]) 04/2018   IDC of left breast    CHF (congestive heart failure) ([place])    Colon polyp 2003   Compression fracture of L2 lumbar vertebra (HCC)    Coronary artery disease    COVID-19 virus infection 12/25/2019   Diabetes mellitus without complication (HCC)    Dyspnea    Dysrhythmia    atrial fib   Environmental and seasonal allergies    GERD (gastroesophageal reflux disease)    Hyperlipidemia    Hypertension    Hypertensive urgency 03/10/2021   Irritable bowel syndrome    Multiple rib fractures 03/15/2018   Personal history of radiation therapy    Polyp of colon    PONV (postoperative nausea and vomiting)    with ether, not recently "woke up in surgery once"   Presence of permanent cardiac pacemaker    Tremor    Tremors of nervous system    Ulcer     Medications:  Warfarin 2 mg Sun & Wed and 4 mg all other days (INR goal 2-3 per outpatient notes). Last dose 12/6 PM   Assessment: 88 year old female with past medical history atrial fibrillation (Chadsvasc at least  7), valvular heart disease, pacemaker placement 2017, CHF, CAD, T2DM,  HLD, HTN. Presenting with altered mental status. Pharmacy has been consulted to manage warfarin.   Baseline H&H stable.  Drug-Drug Interactions: Atenolol: decreased INR (PTA and inpatient)   Date INR Warfarin Dose  12/6 -- 2 mg (PTA)  12/'7 2 4 '$ mg    Goal of Therapy:  INR 2-3 Monitor platelets by anticoagulation protocol: Yes   Plan: INR 2.0 after PTA dosing 12/6. Will continue with PTA dose. Give warfarin 4 mg x 1 tonight Follow up INR and CBC tomorrow AM   Wynelle Cleveland 05/21/2022,7:22 PM

## 2022-05-21 NOTE — ED Provider Triage Note (Signed)
Emergency Medicine Provider Triage Evaluation Note  Leah Holmes , a 88 y.o. female  was evaluated in triage.  Pt complains of altered mental status this and transient episode of dysarthria at about 10 AM this morning.  Patient was advised at the incident at a local urgent care to bacitin to the ED for further evaluation.  Patient is on Coumadin for A-fib denies any history of CVA or TIA.  According to her adult daughter patient is at baseline at the time of this evaluation.  Review of Systems  Positive: AMS, transient dysarthria Negative: FCS  Physical Exam  BP (!) 182/85   Pulse 69   Temp (!) 97.5 F (36.4 C) (Oral)   Resp 18   SpO2 99%  Gen:   Awake, no distress  NAD Resp:  Normal effort CTA MSK:   Moves extremities without difficulty  Other:    Medical Decision Making  Medically screening exam initiated at 2:28 PM.  Appropriate orders placed.  Leah Holmes was informed that the remainder of the evaluation will be completed by another provider, this initial triage assessment does not replace that evaluation, and the importance of remaining in the ED until their evaluation is complete.  Geriatric patient to the ED for episode of transient dysarthria and some increased confusion according to the daughter.  Patient is at baseline at this time.   Melvenia Needles, PA-C 05/21/22 1430

## 2022-05-21 NOTE — Discharge Instructions (Signed)
Go immediately to the [facility] emergency department.  Let them know immediately if her speech changes again, she has facial droop, numbness or tingling, arm or leg weakness, visual changes, if her chest heaviness/pressure returns.

## 2022-05-21 NOTE — ED Notes (Signed)
Patient is being discharged from the Urgent Care and sent to the Emergency Department via [place] with daughter . Per Alphonzo Cruise, MD, patient is in need of higher level of care due to changes in speech. Patient is aware and verbalizes understanding of plan of care.  Vitals:   05/21/22 1128  BP: (!) 151/64  Pulse: 63  Resp: 16  Temp: 98.5 F (36.9 C)  SpO2: 99%

## 2022-05-21 NOTE — ED Provider Notes (Signed)
HPI  SUBJECTIVE:  Leah Holmes is a 88 y.o. female who presents with an episode of garbled speech at 10 AM today.  This has resolved.  She has had repeated intermittent bilateral headaches today.  Daughter reports increasing generalized weakness for the past few days, dizziness described as discoordination today.  Denies vertigo.  She also reports intermittent left-sided chest pain described as heaviness.  Not sure how long it lasted or when it started, but it has resolved.  She does not recall diaphoresis, accompanying nausea, radiation of this pain up her neck, down her arm or through to her back.  Daughter has not noticed any facial droop, arm or leg weakness.  Patient denies numbness or tingling in the face, visual changes.  No fevers, body aches, coughing, wheezing, change in her baseline shortness of breath, abdominal pain, urinary complaints, melena, hematochezia. Patient has complex medical history including atrial fibrillation, sick sinus syndrome, on Coumadin, CHF, diabetes, hypertension, hyperlipidemia, status post pacemaker placement, coronary artery disease, UTI.  NR yesterday was 1.9.  No history of pulmonary disease, TIA/CVA.  PCP: [facility].  History primarily obtained from daughter.  Past Medical History:  Diagnosis Date   Atrial fibrillation ([place]) 2012   Breast cancer ([place]) 04/2018   IDC of left breast    CHF (congestive heart failure) ([place])    Colon polyp 2003   Compression fracture of L2 lumbar vertebra (HCC)    Coronary artery disease    COVID-19 virus infection 12/25/2019   Diabetes mellitus without complication (HCC)    Dyspnea    Dysrhythmia    atrial fib   Environmental and seasonal allergies    GERD (gastroesophageal reflux disease)    Hyperlipidemia    Hypertension    Hypertensive urgency 03/10/2021   Irritable bowel syndrome    Multiple rib fractures 03/15/2018   Personal history of radiation therapy    Polyp of colon    PONV  (postoperative nausea and vomiting)    with ether, not recently "woke up in surgery once"   Presence of permanent cardiac pacemaker    Tremor    Tremors of nervous system    Ulcer     Past Surgical History:  Procedure Laterality Date   ABDOMINAL ADHESION SURGERY     ABDOMINAL HYSTERECTOMY     APPENDECTOMY     BREAST BIOPSY Right late 80s/early 90s   benign   BREAST BIOPSY Left 03/31/2018   [place] bx done with Dr. Bary Castilla, invasive ductal carcinoma   BREAST LUMPECTOMY Left 04/22/2018   Procedure: BREAST LUMPECTOMY;  Surgeon: Robert Bellow, MD;  Location: [facility] ORS;  Service: General;  Laterality: Left;   BREAST LUMPECTOMY Left 04/22/2018   [place], negative LN, clear margins   BREAST LUMPECTOMY Left 2020   positive, done in office   BREAST SURGERY Right    biopsy    CESAREAN SECTION     x 2   CHOLECYSTECTOMY  03/2012   Dr Burt Knack   COLONOSCOPY  2003   Dr Theodosia Paling in [place]   fibroid tumor     [place] N/A 01/28/2016   Procedure: INSERTION PACEMAKER;  Surgeon: Isaias Cowman, MD;  Location: [facility] ORS;  Service: Cardiovascular;  Laterality: N/A;   SENTINEL NODE BIOPSY Left 04/22/2018   Procedure: SENTINEL NODE BIOPSY;  Surgeon: Robert Bellow, MD;  Location: [facility] ORS;  Service: General;  Laterality: Left;   UPPER GI ENDOSCOPY  2003   Dr Theodosia Paling in [place]    Family History  Problem Relation Age of Onset   Breast cancer Sister    Cancer Sister 9       breast   Breast cancer Maternal Aunt 68   Breast cancer Cousin    Breast cancer Other 52    Social History   Tobacco Use   Smoking status: Never   Smokeless tobacco: Former    Types: Snuff   Tobacco comments:    occasionally  Vaping Use   Vaping Use: Never used  Substance Use Topics   Alcohol use: No   Drug use: No    No current facility-administered medications for this encounter.  Current Outpatient Medications:    acetaminophen (TYLENOL) 325 MG tablet, Take 2 tablets (650 mg total) by  mouth every 6 (six) hours as needed., Disp: , Rfl:    albuterol (PROAIR HFA) 108 (90 Base) MCG/ACT inhaler, Inhale 1-2 puffs into the lungs every 4 (four) hours as needed for wheezing or shortness of breath., Disp: 1 Inhaler, Rfl: 5   amLODipine (NORVASC) 10 MG tablet, Take 1 tablet (10 mg total) by mouth daily., Disp: 30 tablet, Rfl: 0   atenolol (TENORMIN) 25 MG tablet, Take 25 mg by mouth 2 (two) times daily., Disp: , Rfl:    Cholecalciferol (VITAMIN D3) 1000 UNITS CAPS, Take 1,000 Units by mouth every morning. , Disp: , Rfl:    docusate sodium (COLACE) 100 MG capsule, Take 2 capsules (200 mg total) by mouth 2 (two) times daily., Disp: 60 capsule, Rfl: 0   glucose blood (ONE TOUCH ULTRA TEST) test strip, USE 1 STRIP TO CHECK GLUCOSE TWICE DAILY  Dx code: E11.9, Disp: 200 each, Rfl: 3   glucose blood (ONE TOUCH ULTRA TEST) test strip, USE ONE STRIP TO CHECK GLUCOSE ONCE  DAILY, Disp: 100 each, Rfl: 3   glucose blood test strip, Use to check blood sugars once daily., Disp: 100 each, Rfl: 12   hydrochlorothiazide (HYDRODIURIL) 25 MG tablet, Take 12.5 mg by mouth daily., Disp: , Rfl:    ipratropium (ATROVENT) 0.03 % nasal spray, Place 2 sprays into both nostrils every 12 (twelve) hours., Disp: 30 mL, Rfl: 12   isosorbide mononitrate (IMDUR) 60 MG 24 hr tablet, Take 60 mg by mouth 2 (two) times daily., Disp: , Rfl:    Lancets (ONETOUCH DELICA PLUS FAOZHY86V) MISC, Place 2 Devices inside cheek daily. DX Code E11.9., Disp: 100 each, Rfl: 2   lisinopril (ZESTRIL) 40 MG tablet, Take 1 tablet by mouth twice daily, Disp: 180 tablet, Rfl: 0   meclizine (ANTIVERT) 25 MG tablet, Take 1 tablet by mouth three times daily as needed, Disp: 40 tablet, Rfl: 0   miconazole (MICOTIN) 2 % cream, Apply 1 application. topically 2 (two) times daily., Disp: 28.35 g, Rfl: 0   mometasone (ELOCON) 0.1 % cream, Apply once ot twice daily as needed for itching, Disp: 15 g, Rfl: 1   nitroGLYCERIN (NITROSTAT) 0.4 MG SL tablet,  Place 0.4 mg under the tongue every 5 (five) minutes as needed for chest pain., Disp: , Rfl:    omeprazole (PRILOSEC) 20 MG capsule, Take 1 capsule by mouth once daily, Disp: 90 capsule, Rfl: 0   ONETOUCH ULTRA test strip, USE 1 STRIP TO CHECK GLUCOSE TWICE DAILY, Disp: 100 each, Rfl: 0   Polyethyl Glycol-Propyl Glycol 0.4-0.3 % SOLN, Place 1 drop into both eyes 3 (three) times daily as needed (for dry/irritated eyes.)., Disp: , Rfl:    polyethylene glycol (MIRALAX / GLYCOLAX) 17 g packet, Take 17 g by  mouth daily as needed., Disp: , Rfl:    psyllium (METAMUCIL) 58.6 % packet, Take 1 packet by mouth daily as needed (for regularity). , Disp: , Rfl:    triamcinolone cream (KENALOG) 0.1 %, APPLY 1 APPLICATION TOPICALLY TWICE DAILY, Disp: 30 g, Rfl: 0   warfarin (COUMADIN) 2 MG tablet, Take 2 mg by mouth 2 (two) times a week. Take '2mg'$  at bedtime on Wednesday & Sunday., Disp: , Rfl:    warfarin (COUMADIN) 4 MG tablet, Take 4 mg by mouth daily at 6 PM. Does not take Wednesday or Sunday., Disp: , Rfl:   Allergies  Allergen Reactions   Codeine Anaphylaxis   Penicillins Anaphylaxis    Has patient had a PCN reaction causing immediate rash, facial/tongue/throat swelling, SOB or lightheadedness with hypotension: Yes Has patient had a PCN reaction causing severe rash involving mucus membranes or skin necrosis: No Has patient had a PCN reaction that required hospitalization Yes Has patient had a PCN reaction occurring within the last 10 years: No If all of the above answers are "NO", then may proceed with Cephalosporin use.   Amlodipine Other (See Comments)    Diffuse itching    Atorvastatin     Other reaction(s): Unknown   Clonidine Derivatives Other (See Comments)    Reaction:  Unknown    Gabapentin Nausea Only   Losartan Other (See Comments)    Other reaction(s): Other (See Comments) Reaction:  Muscle aches  Reaction:  Muscle aches    Morphine     Other reaction(s): Unknown   Morphine And  Related Other (See Comments)    Reaction:  Unknown    Reglan [Metoclopramide] Other (See Comments)    Reaction:  Tremors    Valtrex [Valacyclovir] Other (See Comments)    Tremors    Hydralazine Anxiety   Spironolactone Itching     ROS  As noted in HPI.   Physical Exam  BP (!) 151/64 (BP Location: Left Arm)   Pulse 63   Temp 98.5 F (36.9 C) (Oral)   Resp 16   SpO2 99%   Constitutional: Well developed, well nourished, no acute distress Eyes: PERRL, EOMI, conjunctiva normal bilaterally HENT: Normocephalic, atraumatic,mucus membranes moist.  Cerumen impaction right ear.  Left TM normal. Respiratory: Clear to auscultation bilaterally, no rales, no wheezing, no rhonchi Cardiovascular: Normal rate and rhythm, no murmurs, no gallops, no rubs GI: Soft, nondistended, normal bowel sounds, nontender, no rebound, no guarding skin: No rash, skin intact Musculoskeletal: No edema, no tenderness, no deformities Neurologic: Alert & oriented x 3, CN III-XII intact, speech fluent per daughter, appropriately conversant, no pronator drift, upper or lower extremities, sensation grossly intact upper lower extremities. Psychiatric: Speech and behavior appropriate   ED Course   Medications - No data to display  Orders Placed This Encounter  Procedures   Glucose, capillary    Standing Status:   Standing    Number of Occurrences:   1   CBG monitoring, ED    Standing Status:   Standing    Number of Occurrences:   1   ED EKG    Standing Status:   Standing    Number of Occurrences:   1    Order Specific Question:   Reason for Exam    Answer:   Weakness   EKG 12-Lead    Standing Status:   Standing    Number of Occurrences:   1   Results for orders placed or performed during the hospital encounter of 05/21/22 (  from the past 24 hour(s))  Glucose, capillary     Status: Abnormal   Collection Time: 05/21/22 11:36 AM  Result Value Ref Range   Glucose-Capillary 176 (H) 70 - 99 mg/dL   No  results found.  ED Clinical Impression  1. Garbled speech   2. Chest heaviness      ED Assessment/Plan     EKG: Ventricularly paced, rate 64, axis deviation.  T wave inversion in V2, which is new compared to EKG from 06/28/2021.  No ST elevation.   Fingerstick 176.  Patient's vitals are acceptable, she is neurologically intact, conversant.  She is currently chest pain-free.  I am concerned that she has had a TIA.  I wonder if her dizziness could be from cerumen impaction in the right ear, but the generalized weakness could be due to a number of different things, ranging from ACS, UTI, pneumonia, GI bleed.  She absolutely refuses to go by EMS.  I believe that she is stable to go via private vehicle.  Daughter states that she will take her immediately to [facility].  Gave report to charge nurse.  No orders of the defined types were placed in this encounter.     *This clinic note was created using Dragon dictation software. Therefore, there may be occasional mistakes despite careful proofreading. ?    Melynda Ripple, MD 05/21/22 1212

## 2022-05-22 ENCOUNTER — Ambulatory Visit: Payer: Medicare Other

## 2022-05-22 DIAGNOSIS — E1142 Type 2 diabetes mellitus with diabetic polyneuropathy: Secondary | ICD-10-CM | POA: Diagnosis present

## 2022-05-22 DIAGNOSIS — K219 Gastro-esophageal reflux disease without esophagitis: Secondary | ICD-10-CM | POA: Diagnosis present

## 2022-05-22 DIAGNOSIS — Z881 Allergy status to other antibiotic agents status: Secondary | ICD-10-CM | POA: Diagnosis not present

## 2022-05-22 DIAGNOSIS — R531 Weakness: Secondary | ICD-10-CM | POA: Diagnosis present

## 2022-05-22 DIAGNOSIS — Z7901 Long term (current) use of anticoagulants: Secondary | ICD-10-CM | POA: Diagnosis not present

## 2022-05-22 DIAGNOSIS — I482 Chronic atrial fibrillation, unspecified: Secondary | ICD-10-CM | POA: Diagnosis present

## 2022-05-22 DIAGNOSIS — I2510 Atherosclerotic heart disease of native coronary artery without angina pectoris: Secondary | ICD-10-CM | POA: Diagnosis present

## 2022-05-22 DIAGNOSIS — E785 Hyperlipidemia, unspecified: Secondary | ICD-10-CM | POA: Diagnosis present

## 2022-05-22 DIAGNOSIS — R4701 Aphasia: Secondary | ICD-10-CM | POA: Diagnosis present

## 2022-05-22 DIAGNOSIS — Z90710 Acquired absence of both cervix and uterus: Secondary | ICD-10-CM | POA: Diagnosis not present

## 2022-05-22 DIAGNOSIS — Z880 Allergy status to penicillin: Secondary | ICD-10-CM | POA: Diagnosis not present

## 2022-05-22 DIAGNOSIS — Z923 Personal history of irradiation: Secondary | ICD-10-CM | POA: Diagnosis not present

## 2022-05-22 DIAGNOSIS — I509 Heart failure, unspecified: Secondary | ICD-10-CM | POA: Diagnosis present

## 2022-05-22 DIAGNOSIS — Z853 Personal history of malignant neoplasm of breast: Secondary | ICD-10-CM | POA: Diagnosis not present

## 2022-05-22 DIAGNOSIS — Z20822 Contact with and (suspected) exposure to covid-19: Secondary | ICD-10-CM | POA: Diagnosis present

## 2022-05-22 DIAGNOSIS — Z8616 Personal history of COVID-19: Secondary | ICD-10-CM | POA: Diagnosis not present

## 2022-05-22 DIAGNOSIS — Z888 Allergy status to other drugs, medicaments and biological substances status: Secondary | ICD-10-CM | POA: Diagnosis not present

## 2022-05-22 DIAGNOSIS — Z950 Presence of cardiac pacemaker: Secondary | ICD-10-CM | POA: Diagnosis not present

## 2022-05-22 DIAGNOSIS — I495 Sick sinus syndrome: Secondary | ICD-10-CM | POA: Diagnosis present

## 2022-05-22 DIAGNOSIS — F419 Anxiety disorder, unspecified: Secondary | ICD-10-CM | POA: Diagnosis present

## 2022-05-22 DIAGNOSIS — Z885 Allergy status to narcotic agent status: Secondary | ICD-10-CM | POA: Diagnosis not present

## 2022-05-22 DIAGNOSIS — I110 Hypertensive heart disease with heart failure: Secondary | ICD-10-CM | POA: Diagnosis present

## 2022-05-22 DIAGNOSIS — G250 Essential tremor: Secondary | ICD-10-CM | POA: Diagnosis present

## 2022-05-22 LAB — COMPREHENSIVE METABOLIC PANEL
ALT: UNDETERMINED U/L (ref 0–44)
ALT: 21 U/L (ref 0–44)
ALT: 19 U/L (ref 0–44)
ALT: 21 U/L (ref 0–44)
AST: UNDETERMINED U/L (ref 15–41)
AST: 28 U/L (ref 15–41)
AST: 27 U/L (ref 15–41)
AST: 29 U/L (ref 15–41)
Albumin: UNDETERMINED g/dL (ref 3.5–5.0)
Albumin: 4.2 g/dL (ref 3.5–5.0)
Albumin: 3.3 g/dL — ABNORMAL LOW (ref 3.5–5.0)
Albumin: 3.9 g/dL (ref 3.5–5.0)
Alkaline Phosphatase: UNDETERMINED U/L (ref 38–126)
Alkaline Phosphatase: 60 U/L (ref 38–126)
Alkaline Phosphatase: 50 U/L (ref 38–126)
Alkaline Phosphatase: 59 U/L (ref 38–126)
Anion gap: UNDETERMINED (ref 5–15)
Anion gap: 11 (ref 5–15)
Anion gap: 6 (ref 5–15)
Anion gap: 6 (ref 5–15)
BUN: UNDETERMINED mg/dL (ref 8–23)
BUN: 14 mg/dL (ref 8–23)
BUN: 11 mg/dL (ref 8–23)
BUN: 10 mg/dL (ref 8–23)
CO2: UNDETERMINED mmol/L (ref 22–32)
CO2: 26 mmol/L (ref 22–32)
CO2: 26 mmol/L (ref 22–32)
CO2: 24 mmol/L (ref 22–32)
Calcium: UNDETERMINED mg/dL (ref 8.9–10.3)
Calcium: 9.6 mg/dL (ref 8.9–10.3)
Calcium: 8.8 mg/dL — ABNORMAL LOW (ref 8.9–10.3)
Calcium: 9.2 mg/dL (ref 8.9–10.3)
Chloride: UNDETERMINED mmol/L (ref 98–111)
Chloride: 100 mmol/L (ref 98–111)
Chloride: 104 mmol/L (ref 98–111)
Chloride: 105 mmol/L (ref 98–111)
Creatinine, Ser: UNDETERMINED mg/dL (ref 0.44–1.00)
Creatinine, Ser: 0.51 mg/dL (ref 0.44–1.00)
Creatinine, Ser: 0.44 mg/dL (ref 0.44–1.00)
Creatinine, Ser: 0.36 mg/dL — ABNORMAL LOW (ref 0.44–1.00)
GFR calc Af Amer: UNDETERMINED mL/min (ref >60)
GFR, Estimated: UNDETERMINED mL/min (ref >60)
GFR, Estimated: >60 mL/min (ref >60)
GFR, Estimated: >60 mL/min (ref >60)
GFR, Estimated: >60 mL/min (ref >60)
Glucose, Bld: UNDETERMINED mg/dL (ref 70–99)
Glucose, Bld: 135 mg/dL — ABNORMAL HIGH (ref 70–99)
Glucose, Bld: 129 mg/dL — ABNORMAL HIGH (ref 70–99)
Glucose, Bld: 129 mg/dL — ABNORMAL HIGH (ref 70–99)
Potassium: UNDETERMINED mmol/L (ref 3.5–5.1)
Potassium: 3.8 mmol/L (ref 3.5–5.1)
Potassium: 3.9 mmol/L (ref 3.5–5.1)
Potassium: 3.6 mmol/L (ref 3.5–5.1)
Sodium: UNDETERMINED mmol/L (ref 135–145)
Sodium: 137 mmol/L (ref 135–145)
Sodium: 136 mmol/L (ref 135–145)
Sodium: 135 mmol/L (ref 135–145)
Total Bilirubin: UNDETERMINED mg/dL (ref 0.3–1.2)
Total Bilirubin: 0.9 mg/dL (ref 0.3–1.2)
Total Bilirubin: 1.2 mg/dL (ref 0.3–1.2)
Total Bilirubin: 1.2 mg/dL (ref 0.3–1.2)
Total Protein: UNDETERMINED g/dL (ref 6.5–8.1)
Total Protein: 7.6 g/dL (ref 6.5–8.1)
Total Protein: 6 g/dL — ABNORMAL LOW (ref 6.5–8.1)
Total Protein: 7.1 g/dL (ref 6.5–8.1)

## 2022-05-22 LAB — CBC
HCT: 47.2 % — ABNORMAL HIGH (ref 36.0–46.0)
Hemoglobin: 15.4 g/dL — ABNORMAL HIGH (ref 12.0–15.0)
MCH: 31.2 pg (ref 26.0–34.0)
MCHC: 32.6 g/dL (ref 30.0–36.0)
MCV: 95.7 fL (ref 80.0–100.0)
Platelets: 182 10*3/uL (ref 150–400)
RBC: 4.93 MIL/uL (ref 3.87–5.11)
RDW: 12.5 % (ref 11.5–15.5)
WBC: 4.1 10*3/uL (ref 4.0–10.5)
nRBC: 0 % (ref 0.0–0.2)

## 2022-05-22 LAB — LACTIC ACID, PLASMA: Lactic Acid, Venous: 1.4 mmol/L (ref 0.5–1.9)

## 2022-05-22 LAB — MAGNESIUM: Magnesium: 2.4 mg/dL (ref 1.7–2.4)

## 2022-05-22 LAB — PROTIME-INR
INR: 1.6 — ABNORMAL HIGH (ref 0.8–1.2)
Prothrombin Time: 19.3 seconds — ABNORMAL HIGH (ref 11.4–15.2)

## 2022-05-22 MED ORDER — LISINOPRIL 20 MG PO TABS
40.0000 mg | ORAL_TABLET | Freq: Two times a day (BID) | ORAL | Status: DC
  Administered 2022-05-22 – 2022-05-23 (×2): 40 mg via ORAL
  Filled 2022-05-22: qty 4
  Filled 2022-05-22: qty 2

## 2022-05-22 MED ORDER — AMLODIPINE BESYLATE 5 MG PO TABS: 10.0000 mg | ORAL_TABLET | Freq: Every day | ORAL | Status: DC

## 2022-05-22 MED ORDER — WARFARIN SODIUM 6 MG PO TABS
6.0000 mg | ORAL_TABLET | Freq: Once | ORAL | Status: AC
  Administered 2022-05-22: 6 mg via ORAL
  Filled 2022-05-22 (×2): qty 1

## 2022-05-22 MED ORDER — ISOSORBIDE MONONITRATE ER 30 MG PO TB24
60.0000 mg | ORAL_TABLET | Freq: Two times a day (BID) | ORAL | Status: DC
  Administered 2022-05-22 – 2022-05-23 (×3): 60 mg via ORAL
  Filled 2022-05-22 (×2): qty 2
  Filled 2022-05-22: qty 1

## 2022-05-22 MED ORDER — POTASSIUM CHLORIDE 10 MEQ/100ML IV SOLN
10.0000 meq | INTRAVENOUS | Status: AC
  Administered 2022-05-22: 10 meq via INTRAVENOUS
  Filled 2022-05-22: qty 100

## 2022-05-22 MED ORDER — CALCIUM GLUCONATE-NACL 1-0.675 GM/50ML-% IV SOLN
1.0000 g | Freq: Once | INTRAVENOUS | Status: AC
  Administered 2022-05-22: 1000 mg via INTRAVENOUS
  Filled 2022-05-22: qty 50

## 2022-05-22 MED ORDER — HYDROCHLOROTHIAZIDE 12.5 MG PO TABS
12.5000 mg | ORAL_TABLET | Freq: Every day | ORAL | Status: DC
  Administered 2022-05-22 – 2022-05-23 (×2): 12.5 mg via ORAL
  Filled 2022-05-22 (×3): qty 1

## 2022-05-22 MED ORDER — WARFARIN - PHARMACIST DOSING INPATIENT
Freq: Every day | Status: DC
  Filled 2022-05-22: qty 1

## 2022-05-22 MED ORDER — AMLODIPINE BESYLATE 5 MG PO TABS
10.0000 mg | ORAL_TABLET | Freq: Every day | ORAL | Status: DC
  Administered 2022-05-22 – 2022-05-23 (×2): 10 mg via ORAL
  Filled 2022-05-22 (×2): qty 2

## 2022-05-22 MED ORDER — MAGNESIUM SULFATE 2 GM/50ML IV SOLN
2.0000 g | Freq: Once | INTRAVENOUS | Status: AC
  Administered 2022-05-22: 2 g via INTRAVENOUS
  Filled 2022-05-22: qty 50

## 2022-05-22 MED ORDER — HYDROCHLOROTHIAZIDE 12.5 MG PO TABS: 12.5000 mg | ORAL_TABLET | Freq: Every day | ORAL | Status: DC

## 2022-05-22 MED ORDER — LISINOPRIL 10 MG PO TABS: 40.0000 mg | ORAL_TABLET | Freq: Two times a day (BID) | ORAL | Status: DC

## 2022-05-22 NOTE — Progress Notes (Signed)
PHARMACY CONSULT NOTE - FOLLOW UP  Pharmacy Consult for Electrolyte Monitoring and Replacement   Recent Labs: Potassium (mmol/L)  Date Value  05/22/2022 3.6  10/05/2014 4.1   Magnesium (mg/dL)  Date Value  05/21/2022 1.0 (L)   Calcium (mg/dL)  Date Value  05/22/2022 9.2   Calcium, Total (mg/dL)  Date Value  10/05/2014 9.2   Albumin (g/dL)  Date Value  05/22/2022 3.9  02/25/2014 3.7   Sodium (mmol/L)  Date Value  05/22/2022 135  10/05/2014 136     Assessment: 12/8 @ 0212:  Ca = 8.8, Alb = 3.3 , Corrected Ca = 9.36  - all electrolytes WNL now.  This is surprising considering how low her original values were @ 1400 and how little supplementation was given .  Suspect 1400 labs were in error.   12/8 @ 0514:  all electrolytes WNL; this validates CMP drawn @ 0200.   Goal of Therapy:  All electrolytes WNL   Plan:  12/8:  AM electrolytes are WNL, no additional supplementation needed at this time.  - Will recheck electrolytes on 12/9 with AM labs.   Orene Desanctis ,PharmD Clinical Pharmacist 05/22/2022 6:15 AM

## 2022-05-22 NOTE — Assessment & Plan Note (Signed)
Glycemic protocol.   

## 2022-05-22 NOTE — Progress Notes (Addendum)
PROGRESS NOTE    Leah Holmes  JKK:[id number] DOB: 1927-01-13 DOA: 05/21/2022 PCP: Crecencio Mc, MD  Outpatient Specialists: cardiology    Brief Narrative:   From admission h and p by dr. Posey Pronto: "Leah Holmes is an 88 y.o. female brought to the hospital for altered mental status.  Patient developed slurred speech and some transient patient is alert awake answers questions has benign essential tremor of the neck.  Is very weak and shaky.  Daughter is helping her from wheelchair to the bed.   Episode of AMS then brief few minutes of aphasia. "    Assessment & Plan:   Principal Problem:   Generalized weakness Active Problems:   CAD (coronary artery disease)   DM type 2 with diabetic peripheral neuropathy (HCC)   Essential hypertension, benign   Chronic atrial fibrillation (HCC)   Sick sinus syndrome (HCC)   Invasive ductal carcinoma of breast, female, left ([facility])  # Transient aphasia # Carotid artery stenosis Concern for TIA/CVA. CT head neg. Can't get MRI due to pacemaker status. Does have known a-fib, CAS. Passed bedside swallow eval - I have consulted neurology for further assistance working up this problem. Likely needs further evaluation of her cerebral vasculature but will defer to neurology  # Generalized weakness Nothing focal on exam. No recent illness. Lives with daughter. - PT/OT consults  # CAD Severe 3-vessel disease not amenable to PCI, medically managed by cardiology. Asymptomatic, trops neg - cont home atenolol, imdur  # A-fib Rate controlled - will hold warfarin while stroke w/u is underway - cont atenolol  # HTN Here BPs elevated to 160s this morning - will hold home amlod, lisinopril, hctz while acute stroke w/u is underway.   # T2DM Diet controlled - daily fasting glucose   DVT prophylaxis: SCDs Code Status: full Family Communication: daughter updated telephonically 12/8  Level of care: Telemetry Cardiac Status is:  Observation     Consultants:  neurology  Procedures: none  Antimicrobials:  none    Subjective: This morning feeling fine but complaining of generalized weakness  Objective: Vitals:   05/22/22 0030 05/22/22 0303 05/22/22 0346 05/22/22 0634  BP: (!) 159/67 (!) 168/73  (!) 168/70  Pulse: 63 60  60  Resp: '18 18  18  '$ Temp:   98.4 F (36.9 C)   TempSrc:   Oral   SpO2: 97% 98%  98%  Weight:      Height:        Intake/Output Summary (Last 24 hours) at 05/22/2022 0905 Last data filed at 05/21/2022 2043 Gross per 24 hour  Intake 100 ml  Output --  Net 100 ml   Filed Weights   05/21/22 1534  Weight: 53.2 kg    Examination:  General exam: Appears calm and comfortable  Respiratory system: Clear to auscultation. Respiratory effort normal. Cardiovascular system: rrr, soft systolic murmur Gastrointestinal system: Abdomen is nondistended, soft and nontender. No organomegaly or masses felt. Normal bowel sounds heard. Central nervous system: Alert, moving all 4, cn2-12 grossly intact Extremities: Symmetric power. Skin: No rashes, lesions or ulcers Psychiatry: calm    Data Reviewed: I have personally reviewed following labs and imaging studies  CBC: Recent Labs  Lab 05/21/22 1339 05/22/22 0514  WBC 5.6 4.1  NEUTROABS 4.0  --   HGB 14.8 15.4*  HCT 44.7 47.2*  MCV 95.9 95.7  PLT 209 716   Basic Metabolic Panel: Recent Labs  Lab 05/21/22 1405 05/21/22 1607 05/22/22 0212 05/22/22 9678  NA SPECIMEN CONTAMINATED, UNABLE TO PERFORM TEST(S). 137 136 135  K SPECIMEN CONTAMINATED, UNABLE TO PERFORM TEST(S). 3.8 3.9 3.6  CL SPECIMEN CONTAMINATED, UNABLE TO PERFORM TEST(S). 100 104 105  CO2 SPECIMEN CONTAMINATED, UNABLE TO PERFORM TEST(S). '26 26 24  '$ GLUCOSE SPECIMEN CONTAMINATED, UNABLE TO PERFORM TEST(S). 135* 129* 129*  BUN SPECIMEN CONTAMINATED, UNABLE TO PERFORM TEST(S). '14 11 10  '$ CREATININE SPECIMEN CONTAMINATED, UNABLE TO PERFORM TEST(S). 0.51 0.44 0.36*   CALCIUM SPECIMEN CONTAMINATED, UNABLE TO PERFORM TEST(S). 9.6 8.8* 9.2  MG 1.0*  --   --  2.4   GFR: Estimated Creatinine Clearance: 36.9 mL/min (A) (by C-G formula based on SCr of 0.36 mg/dL (L)). Liver Function Tests: Recent Labs  Lab 05/21/22 1405 05/21/22 1607 05/22/22 0212 05/22/22 0514  AST SPECIMEN CONTAMINATED, UNABLE TO PERFORM TEST(S). '28 27 29  '$ ALT SPECIMEN CONTAMINATED, UNABLE TO PERFORM TEST(S). '21 19 21  '$ ALKPHOS SPECIMEN CONTAMINATED, UNABLE TO PERFORM TEST(S). 60 50 59  BILITOT SPECIMEN CONTAMINATED, UNABLE TO PERFORM TEST(S). 0.9 1.2 1.2  PROT SPECIMEN CONTAMINATED, UNABLE TO PERFORM TEST(S). 7.6 6.0* 7.1  ALBUMIN SPECIMEN CONTAMINATED, UNABLE TO PERFORM TEST(S). 4.2 3.3* 3.9   No results for input(s): "LIPASE", "AMYLASE" in the last 168 hours. No results for input(s): "AMMONIA" in the last 168 hours. Coagulation Profile: Recent Labs  Lab 05/21/22 1405 05/22/22 0514  INR 2.0* 1.6*   Cardiac Enzymes: No results for input(s): "CKTOTAL", "CKMB", "CKMBINDEX", "TROPONINI" in the last 168 hours. BNP (last 3 results) No results for input(s): "PROBNP" in the last 8760 hours. HbA1C: No results for input(s): "HGBA1C" in the last 72 hours. CBG: Recent Labs  Lab 05/21/22 1136 05/21/22 1553  GLUCAP 176* 141*   Lipid Profile: No results for input(s): "CHOL", "HDL", "LDLCALC", "TRIG", "CHOLHDL", "LDLDIRECT" in the last 72 hours. Thyroid Function Tests: Recent Labs    05/21/22 2054  TSH 0.752  FREET4 0.91   Anemia Panel: No results for input(s): "VITAMINB12", "FOLATE", "FERRITIN", "TIBC", "IRON", "RETICCTPCT" in the last 72 hours. Urine analysis:    Component Value Date/Time   COLORURINE STRAW (A) 05/21/2022 1630   APPEARANCEUR CLEAR (A) 05/21/2022 1630   APPEARANCEUR Clear 10/05/2014 2114   LABSPEC 1.008 05/21/2022 1630   LABSPEC 1.027 10/05/2014 2114   PHURINE 7.0 05/21/2022 1630   GLUCOSEU NEGATIVE 05/21/2022 1630   GLUCOSEU NEGATIVE 04/08/2022 1438    HGBUR NEGATIVE 05/21/2022 1630   BILIRUBINUR NEGATIVE 05/21/2022 1630   BILIRUBINUR negative 01/19/2017 1531   BILIRUBINUR Negative 10/05/2014 2114   KETONESUR NEGATIVE 05/21/2022 1630   PROTEINUR NEGATIVE 05/21/2022 1630   UROBILINOGEN 0.2 04/08/2022 1438   NITRITE NEGATIVE 05/21/2022 1630   LEUKOCYTESUR NEGATIVE 05/21/2022 1630   LEUKOCYTESUR 2+ 10/05/2014 2114   Sepsis Labs: '@LABRCNTIP'$ (procalcitonin:4,lacticidven:4)  ) Recent Results (from the past 240 hour(s))  Resp Panel by RT-PCR (Flu A&B, Covid) Anterior Nasal Swab     Status: None   Collection Time: 05/21/22  4:05 PM   Specimen: Anterior Nasal Swab  Result Value Ref Range Status   SARS Coronavirus 2 by RT PCR NEGATIVE NEGATIVE Final    Comment: (NOTE) SARS-CoV-2 target nucleic acids are NOT DETECTED.  The SARS-CoV-2 RNA is generally detectable in upper respiratory specimens during the acute phase of infection. The lowest concentration of SARS-CoV-2 viral copies this assay can detect is 138 copies/mL. A negative result does not preclude SARS-Cov-2 infection and should not be used as the sole basis for treatment or other patient management decisions. A negative result may occur with  improper  specimen collection/handling, submission of specimen other than nasopharyngeal swab, presence of viral mutation(s) within the areas targeted by this assay, and inadequate number of viral copies(<138 copies/mL). A negative result must be combined with clinical observations, patient history, and epidemiological information. The expected result is Negative.  Fact Sheet for Patients:  EntrepreneurPulse.com.au  Fact Sheet for Healthcare Providers:  IncredibleEmployment.be  This test is no t yet approved or cleared by the [place] FDA and  has been authorized for detection and/or diagnosis of SARS-CoV-2 by FDA under an Emergency Use Authorization (EUA). This EUA will remain  in effect  (meaning this test can be used) for the duration of the COVID-19 declaration under Section 564(b)(1) of the Act, 21 U.S.C.section 360bbb-3(b)(1), unless the authorization is terminated  or revoked sooner.       Influenza A by PCR NEGATIVE NEGATIVE Final   Influenza B by PCR NEGATIVE NEGATIVE Final    Comment: (NOTE) The Xpert Xpress SARS-CoV-2/FLU/RSV plus assay is intended as an aid in the diagnosis of influenza from Nasopharyngeal swab specimens and should not be used as a sole basis for treatment. Nasal washings and aspirates are unacceptable for Xpert Xpress SARS-CoV-2/FLU/RSV testing.  Fact Sheet for Patients: EntrepreneurPulse.com.au  Fact Sheet for Healthcare Providers: IncredibleEmployment.be  This test is not yet approved or cleared by the [place] FDA and has been authorized for detection and/or diagnosis of SARS-CoV-2 by FDA under an Emergency Use Authorization (EUA). This EUA will remain in effect (meaning this test can be used) for the duration of the COVID-19 declaration under Section 564(b)(1) of the Act, 21 U.S.C. section 360bbb-3(b)(1), unless the authorization is terminated or revoked.  Performed at [facility], [address] [place], [place] [address]          Radiology Studies: CT HEAD WO CONTRAST  Result Date: 05/21/2022 CLINICAL DATA:  Altered mental status EXAM: CT HEAD WITHOUT CONTRAST TECHNIQUE: Contiguous axial images were obtained from the base of the skull through the vertex without intravenous contrast. RADIATION DOSE REDUCTION: This exam was performed according to the departmental dose-optimization program which includes automated exposure control, adjustment of the mA and/or kV according to patient size and/or use of iterative reconstruction technique. COMPARISON:  07/14/2021 FINDINGS: Brain: No evidence of acute infarction, hemorrhage, mass, mass effect, or midline shift. No hydrocephalus or  extra-axial fluid collection. Periventricular white matter changes, likely the sequela of chronic small vessel ischemic disease. Age related cerebral atrophy. Right basal ganglia lacunar infarct. Vascular: No hyperdense vessel. Atherosclerotic calcifications in the intracranial carotid and vertebral arteries. Skull: Normal. Negative for fracture or focal lesion. Sinuses/Orbits: No acute finding. Status post left lens replacement. Other: The mastoid air cells are well aerated. IMPRESSION: No acute intracranial process. Electronically Signed   By: Merilyn Baba M.D.   On: 05/21/2022 14:06        Scheduled Meds:  atenolol  25 mg Oral BID   sodium chloride flush  3 mL Intravenous Q12H   Warfarin - Pharmacist Dosing Inpatient   Does not apply q1600   Continuous Infusions:  sodium chloride 50 mL/hr at 05/21/22 2102     LOS: 0 days     Desma Maxim, MD [facility]   If 7PM-7AM, please contact night-coverage [email or web address] Password [email or web address] 05/22/2022, 9:05 AM

## 2022-05-22 NOTE — Assessment & Plan Note (Addendum)
Currently patient in sinus rhythm.  Atenolol continued . Anticoagulation with Coumadin continued with pharmacy consult.    Latest Ref Rng & Units 05/21/2022    1:39 PM 04/08/2022    2:37 PM 07/14/2021    7:56 PM  CBC  WBC 4.0 - 10.5 K/uL 5.6  5.3  9.3   Hemoglobin 12.0 - 15.0 g/dL 14.8  13.9  15.1   Hematocrit 36.0 - 46.0 % 44.7  42.2  45.7   Platelets 150 - 400 K/uL 209  178.0  220

## 2022-05-22 NOTE — Progress Notes (Signed)
PHARMACY CONSULT NOTE - FOLLOW UP  Pharmacy Consult for Electrolyte Monitoring and Replacement   Recent Labs: Potassium (mmol/L)  Date Value  05/21/2022 <2.0 (LL)  10/05/2014 4.1   Magnesium (mg/dL)  Date Value  05/21/2022 1.0 (L)   Calcium (mg/dL)  Date Value  05/21/2022 <4.0 (LL)   Calcium, Total (mg/dL)  Date Value  10/05/2014 9.2   Albumin (g/dL)  Date Value  05/21/2022 1.7 (L)  02/25/2014 3.7   Sodium (mmol/L)  Date Value  05/21/2022 139  10/05/2014 136     Assessment: 12/7 @ 1405:   K = < 2.0,  Mag = 1.0                          Ca = < 4.0, Alb = 1.7, Corrected Ca = 5.84  KCl 10 mEq IV X 1 given on 12/7 @ 1900. Calcium gluconate 1 gm IV X 1 given on 12/7 @ 2100.    Goal of Therapy:  Electrolytes WNL   Plan:  Will order CMP STAT on 12/8 @ ~ 0200.  Mag Sulfate 2 gm IV X 1 due on 0200.  Will order additional KCl 10 mEq IV X 2                                     Calcium gluconate 1 gm IV X 1   Leah Holmes D ,PharmD Clinical Pharmacist 05/22/2022 2:21 AM

## 2022-05-22 NOTE — Evaluation (Signed)
Occupational Therapy Evaluation Patient Details Name: Leah Holmes MRN: [id number] DOB: [birth year] Today's Date: 05/22/2022   History of Present Illness Pt is a 80 year of female presenting to the ED with AMS, generalized weakness; PMH significant for CHF, diabetes, hypertension, hyperlipidemia, and chronic tremor   Clinical Impression   Chart reviewed, RN cleared pt for participation in OT evaluation. Co tx completed with PT on this date. Pt is alert, oriented to self, place, grossly to situation, not oriented to date. Increased time required for processing and fair-poor safety awareness throughout. ?historian regarding home set up/PLOF. PTA Pt reports she lives with her daughter and son in law who have needed to provide more assist for ADL/IADL than tyipcal baseline. Pt reports she can typically dress herself, amb with a rollator. Pt presents with deficits in strength, endurance, activity tolerance, balance, cognition all affecting safe and optimal ADL completion. Recommend discharge to STR to address functional deficits, to facilitate return to PLOF. Potential for pt to return home with [place] if pt has physical assist for ADLs. Attempted to call daughter for clarification on PLOF, unable to reach at this time.OT will continue to follow acutely.     Recommendations for follow up therapy are one component of a multi-disciplinary discharge planning process, led by the attending physician.  Recommendations may be updated based on patient status, additional functional criteria and insurance authorization.   Follow Up Recommendations  Skilled nursing-short term rehab (<3 hours/day)     Assistance Recommended at Discharge Frequent or constant Supervision/Assistance  Patient can return home with the following A little help with walking and/or transfers;A little help with bathing/dressing/bathroom;Assistance with cooking/housework;Direct supervision/assist for medications management     Functional Status Assessment  Patient has had a recent decline in their functional status and demonstrates the ability to make significant improvements in function in a reasonable and predictable amount of time.  Equipment Recommendations  Other (comment) (per next venue of care)    Recommendations for Other Services       Precautions / Restrictions Precautions Precautions: Fall Precaution Comments: chronic tremor Restrictions Weight Bearing Restrictions: No      Mobility Bed Mobility Overal bed mobility: Needs Assistance Bed Mobility: Supine to Sit, Sit to Supine     Supine to sit: Min assist, HOB elevated Sit to supine: Min assist, HOB elevated   General bed mobility comments: from ED stretcher    Transfers Overall transfer level: Needs assistance Equipment used: Rolling walker (2 wheels) Transfers: Sit to/from Stand Sit to Stand: Min guard                  Balance Overall balance assessment: Needs assistance Sitting-balance support: Feet unsupported Sitting balance-[name]: Fair   Postural control: Posterior lean Standing balance support: During functional activity, Reliant on assistive device for balance, Bilateral upper extremity supported Standing balance-[name]: Fair                             ADL either performed or assessed with clinical judgement   ADL Overall ADL's : Needs assistance/impaired Eating/Feeding: Set up;Sitting   Grooming: Wash/dry face;Sitting;Minimal assistance           Upper Body Dressing : Minimal assistance;Sitting Upper Body Dressing Details (indicate cue type and reason): gown Lower Body Dressing: Maximal assistance;Cueing for sequencing Lower Body Dressing Details (indicate cue type and reason): shoes Toilet Transfer: Rolling walker (2 wheels);Min guard;Minimal assistance Toilet Transfer Details (indicate cue  type and reason): simualted, step by step vcs for safety Toileting- Clothing  Manipulation and Hygiene: Maximal assistance;Sit to/from stand       Functional mobility during ADLs: Min guard;Minimal assistance;Rolling walker (2 wheels)       Vision Baseline Vision/History: 1 Wears glasses Patient Visual Report: No change from baseline       Perception     Praxis      Pertinent Vitals/Pain Pain Assessment Pain Assessment: No/denies pain     Hand Dominance     Extremity/Trunk Assessment Upper Extremity Assessment Upper Extremity Assessment: Generalized weakness   Lower Extremity Assessment Lower Extremity Assessment: Generalized weakness       Communication     Cognition Arousal/Alertness: Awake/alert Behavior During Therapy: WFL for tasks assessed/performed Overall Cognitive Status: No family/caregiver present to determine baseline cognitive functioning Area of Impairment: Orientation, Attention, Memory, Following commands, Safety/judgement, Awareness, Problem solving                 Orientation Level: Disoriented to, Time Current Attention Level: Sustained Memory: Decreased short-term memory Following Commands: Follows one step commands with increased time Safety/Judgement: Decreased awareness of deficits Awareness: Intellectual Problem Solving: Slow processing, Requires verbal cues, Requires tactile cues       General Comments  vital signs appear stable throughout    Exercises Other Exercises Other Exercises: edu re: role of OT, role of rehab, discharge recommendations.  falls prevention   Shoulder Instructions      Home Living Family/patient expects to be discharged to:: Private residence Living Arrangements: Children Available Help at Discharge: Family Type of Home: House Home Access: Stairs to enter [employer] of Steps: 2   Home Layout: Multi-level;Able to live on main level with bedroom/bathroom               Home Equipment: Rollator (4 wheels)   Additional Comments: pt is a ?historian,  tangential at times; PLOF will require verification      Prior Functioning/Environment Prior Level of Function : Needs assist             Mobility Comments: pt reports she amb with rollator ADLs Comments: pt reports she was able to dress herself until about a week ago, then required assistance; assist for IADLs from family        OT Problem List: Decreased strength;Decreased activity tolerance;Impaired balance (sitting and/or standing);Decreased safety awareness;Decreased cognition;Decreased knowledge of use of DME or AE      OT Treatment/Interventions: Self-care/ADL training;Patient/family education;Therapeutic exercise;Balance training;Energy conservation;Therapeutic activities;DME and/or AE instruction    OT Goals(Current goals can be found in the care plan section) Acute Rehab OT Goals Patient Stated Goal: get stronger OT Goal Formulation: With patient Time For Goal Achievement: 06/05/22 Potential to Achieve Goals: Good ADL Goals Pt Will Perform Grooming: with supervision;sitting Pt Will Perform Lower Body Dressing: with supervision;sit to/from stand Pt Will Transfer to Toilet: with supervision;ambulating Pt Will Perform Toileting - Clothing Manipulation and hygiene: with supervision;sit to/from stand  OT Frequency: Min 2X/week    Co-evaluation PT/OT/SLP Co-Evaluation/Treatment: Yes Reason for Co-Treatment: Complexity of the patient's impairments (multi-system involvement);For patient/therapist safety;To address functional/ADL transfers   OT goals addressed during session: ADL's and self-care      AM-PAC OT "6 Clicks" Daily Activity     Outcome Measure Help from another person eating meals?: A Little Help from another person taking care of personal grooming?: A Little Help from another person toileting, which includes using toliet, bedpan, or urinal?: A Lot Help from another  person bathing (including washing, rinsing, drying)?: A Lot Help from another person to put  on and taking off regular upper body clothing?: A Little Help from another person to put on and taking off regular lower body clothing?: A Lot 6 Click Score: 15   End of Session Equipment Utilized During Treatment: Rolling walker (2 wheels) Nurse Communication: Mobility status  Activity Tolerance: Patient tolerated treatment well Patient left: in bed;with call bell/phone within reach  OT Visit Diagnosis: Unsteadiness on feet (R26.81);Muscle weakness (generalized) (M62.81)                Time: 3241-9914 OT Time Calculation (min): 19 min Charges:  OT General Charges $OT Visit: 1 Visit OT Evaluation $OT Eval Low Complexity: 1 Low  Shanon Payor, OTD OTR/L  05/22/22, 11:05 AM

## 2022-05-22 NOTE — Consult Note (Signed)
Neurology Consultation Reason for Consult: Generalized weakness Referring Physician: Si Raider, N  CC: Generalized weakness  History is obtained from: Patient  HPI: Leah Holmes is a 88 y.o. female with a history of atrial fibrillation, diabetes, hypertension, hyperlipidemia who is anticoagulated with Coumadin who presents with generalized weakness has been progressive over the past few years.  She states that she has progressively had more more difficulty getting up and going about her daily activities.  She has been using a walker, however has finally been getting to the point where she feels very unsteady when she gets up and walks around.  She does complain of bilateral paresthesias which she has been having for quite some time, and has been diagnosed with peripheral neuropathy.  She did have a transient episode of confusion of unclear etiology.   Past Medical History:  Diagnosis Date   Atrial fibrillation ([place]) 2012   Breast cancer ([place]) 04/2018   IDC of left breast    CHF (congestive heart failure) ([place])    Colon polyp 2003   Compression fracture of L2 lumbar vertebra (HCC)    Coronary artery disease    COVID-19 virus infection 12/25/2019   Diabetes mellitus without complication (HCC)    Dyspnea    Dysrhythmia    atrial fib   Environmental and seasonal allergies    GERD (gastroesophageal reflux disease)    Hyperlipidemia    Hypertension    Hypertensive urgency 03/10/2021   Irritable bowel syndrome    Multiple rib fractures 03/15/2018   Personal history of radiation therapy    Polyp of colon    PONV (postoperative nausea and vomiting)    with ether, not recently "woke up in surgery once"   Presence of permanent cardiac pacemaker    Tremor    Tremors of nervous system    Ulcer      Family History  Problem Relation Age of Onset   Breast cancer Sister    Cancer Sister 66       breast   Breast cancer Maternal Aunt 68   Breast cancer Cousin    Breast cancer  Other 6     Social History:  reports that she has never smoked. She has quit using smokeless tobacco.  Her smokeless tobacco use included snuff. She reports that she does not drink alcohol and does not use drugs.   Exam: Current vital signs: BP (!) 168/70   Pulse 67   Temp 98.4 F (36.9 C) (Oral)   Resp 20   Ht '5\' 6"'$  (1.676 m)   Wt 53.2 kg   SpO2 99%   BMI 18.93 kg/m  Vital signs in last 24 hours: Temp:  [98.2 F (36.8 C)-98.5 F (36.9 C)] 98.4 F (36.9 C) (12/08 0346) Pulse Rate:  [58-76] 67 (12/08 1232) Resp:  [17-20] 20 (12/08 1232) BP: (129-186)/(56-82) 168/70 (12/08 0634) SpO2:  [97 %-99 %] 99 % (12/08 1232)   Physical Exam  Constitutional: Appears well-developed and well-nourished.  Psych: Affect appropriate to situation Eyes: No scleral injection HENT: No OP obstruction MSK: no joint deformities.  Cardiovascular: Normal rate and regular rhythm.  Respiratory: Effort normal, non-labored breathing GI: Soft.  No distension. There is no tenderness.  Skin: WDI  Neuro: Mental Status: Patient is awake, alert, oriented to person, place, month, year, and situation. Patient is able to give a clear and coherent history. No signs of aphasia or neglect Cranial Nerves: II: Visual Fields are full. Pupils are equal, round, and reactive to light.  III,IV, VI: EOMI without ptosis or diploplia.  V: Facial sensation is symmetric to temperature VII: Facial movement is symmetric.  VIII: hearing is intact to voice X: Uvula elevates symmetrically XI: Shoulder shrug is symmetric. XII: tongue is midline without atrophy or fasciculations.  Motor: She has at least 4/5 strength of all four extremities, she has relatively poor effort in bilateral lower extremities Sensory: Sensation is diminished in the lateral aspect of her left foot  Deep Tendon Reflexes: 1+ and symmetric in the biceps and patellae.  Absent at the ankles Cerebellar: No clear ataxia     I have reviewed  labs in epic and the results pertinent to this consultation are: CMP with low protein, otherwise unremarkable  I have reviewed the images obtained: CT head-negative  Impression: 88 year old female with what is likely multifactorial gait disorder including peripheral neuropathy, chronic ischemic white matter disease, previous stroke, deconditioning due to age.  The transient episode of difficulty speaking is of unclear etiology, but could have been a transient confusional episode.  I would favor continuing anticoagulation, this was held and I would favor restarting it.  She will likely need PT/OT, and I would favor concentrating on this.  With her complaint being primarily generalized weakness, will be hesitant to introduce statin even if her LDL was elevated.  Recommendations: 1) continue anticoagulation 2) PT, OT, ST 3) neurology will be available as needed     Roland Rack, MD [facility] [phone]  If 7pm- 7am, please page neurology on call as listed in [facility].

## 2022-05-22 NOTE — Assessment & Plan Note (Signed)
Continue patient on atenolol, hydralazine, as needed nitroglycerin.

## 2022-05-22 NOTE — Assessment & Plan Note (Addendum)
Vitals:   05/21/22 1319 05/21/22 1458 05/21/22 1649 05/21/22 2100  BP: (!) 182/85 (!) 194/88 (!) 186/82 (!) 151/69   05/21/22 2108 05/21/22 2314 05/22/22 0030  BP: (!) 151/69 (!) 129/56 (!) 159/67  As needed hydralazine every 4 hours, atenolol continued.

## 2022-05-22 NOTE — Assessment & Plan Note (Signed)
Attribute to her recent COVID illness and her electrolytes. Patient will require aggressive physical therapy and rehab. Will admit her with fall precautions. Physical therapy Occupational Therapy.Marland Kitchen

## 2022-05-22 NOTE — Progress Notes (Signed)
PHARMACY CONSULT NOTE - FOLLOW UP  Pharmacy Consult for Electrolyte Monitoring and Replacement   Recent Labs: Potassium (mmol/L)  Date Value  05/22/2022 3.9  10/05/2014 4.1   Magnesium (mg/dL)  Date Value  05/21/2022 1.0 (L)   Calcium (mg/dL)  Date Value  05/22/2022 8.8 (L)   Calcium, Total (mg/dL)  Date Value  10/05/2014 9.2   Albumin (g/dL)  Date Value  05/22/2022 3.3 (L)  02/25/2014 3.7   Sodium (mmol/L)  Date Value  05/22/2022 136  10/05/2014 136     Assessment: 12/8 @ 0212:  Ca = 8.8, Alb = 3.3 , Corrected Ca = 9.36  - all electrolytes WNL now.  This is surprising considering how low her original values were @ 1400 and how little supplementation was given .  Suspect 1400 labs were in error.   Goal of Therapy:  electrolytes  Plan:  Will hold KCl and Calcium gluconate for now (Calcium already started infusing but RN will stop now)  Will recheck electrolytes on 12/8 with AM labs.   Orene Desanctis ,PharmD Clinical Pharmacist 05/22/2022 3:04 AM

## 2022-05-22 NOTE — Evaluation (Signed)
Physical Therapy Evaluation Patient Details Name: Leah Holmes MRN: [id number] DOB: [birth year] Today's Date: 05/22/2022  History of Present Illness  Pt is a 88 y/o F admitted on 05/21/22 after presenting with c/o AMS & transient aphasia. Concern for TIA/CVA. Head CT negative but unable to get MRI 2/2 pacemaker. PMH: benign essential neck tremor, a-fib, breast CA, CHF, CAD, DM, HTN, HLD, IBS, tremor  Clinical Impression  Pt seen for PT tx with co-tx with OT. Pt reports she lives with her daughter & son-in-law & has 1 step to enter the garage & stays on the main level. Pt reports she is ambulatory with rollator but has been very weak the past couple days. On this date, pt requires min assist for bed mobility, min assist for gait with RW. During initial gait pt experiences LOB/LE buckling & requires mod assist to correct but otherwise min assist. Unsure if pt's family can provide physical assistance at d/c so currently recommending STR with potential to change to HHPT if family can provide necessary level of assistance at d/c.   Recommendations for follow up therapy are one component of a multi-disciplinary discharge planning process, led by the attending physician.  Recommendations may be updated based on patient status, additional functional criteria and insurance authorization.  Follow Up Recommendations Skilled nursing-short term rehab (<3 hours/day) Can patient physically be transported by private vehicle: Yes    Assistance Recommended at Discharge Intermittent Supervision/Assistance  Patient can return home with the following  A little help with walking and/or transfers;A little help with bathing/dressing/bathroom;Assistance with cooking/housework;Help with stairs or ramp for entrance;Assist for transportation    Equipment Recommendations Rolling walker (2 wheels)  Recommendations for Other Services       Functional Status Assessment Patient has had a recent decline in their  functional status and demonstrates the ability to make significant improvements in function in a reasonable and predictable amount of time.     Precautions / Restrictions Precautions Precautions: Fall Precaution Comments: benign neck tremor Restrictions Weight Bearing Restrictions: No      Mobility  Bed Mobility Overal bed mobility: Needs Assistance Bed Mobility: Supine to Sit, Sit to Supine     Supine to sit: Min assist, HOB elevated Sit to supine: Min assist, HOB elevated   General bed mobility comments: from ED stretcher    Transfers Overall transfer level: Needs assistance Equipment used: Rolling walker (2 wheels) Transfers: Sit to/from Stand                  Ambulation/Gait Ambulation/Gait assistance: Min assist, Mod assist (min assist except mod assist during initial LOB/BLE weakness) Gait Distance (Feet): 30 Feet Assistive device: Rolling walker (2 wheels) Gait Pattern/deviations: Decreased step length - left, Decreased step length - right, Decreased stride length Gait velocity: slightly decreased        Stairs            Wheelchair Mobility    Modified Rankin (Stroke Patients Only)       Balance Overall balance assessment: Needs assistance Sitting-balance support: Feet unsupported Sitting balance-[name]: Fair     Standing balance support: During functional activity, Reliant on assistive device for balance, Bilateral upper extremity supported Standing balance-[name]: Fair                               Pertinent Vitals/Pain Pain Assessment Pain Assessment: No/denies pain    Home Living Family/patient expects to be discharged  to:: Private residence Living Arrangements: Children Available Help at Discharge: Family Type of Home: House Home Access: Stairs to enter Entrance Stairs-Rails:  (at garage) Technical brewer of Steps: 1   Home Layout: Multi-level;Able to live on main level with  bedroom/bathroom Home Equipment: Rollator (4 wheels) Additional Comments: pt is a ?historian, tangential at times; PLOF will require verification    Prior Function Prior Level of Function : Needs assist             Mobility Comments: pt reports she amb with rollator ADLs Comments: pt reports she was able to dress herself until about a week ago, then required assistance; assist for IADLs from family     Hand Dominance        Extremity/Trunk Assessment   Upper Extremity Assessment Upper Extremity Assessment: Generalized weakness    Lower Extremity Assessment Lower Extremity Assessment: Generalized weakness       Communication   Communication: HOH  Cognition Arousal/Alertness: Awake/alert Behavior During Therapy: WFL for tasks assessed/performed Overall Cognitive Status: No family/caregiver present to determine baseline cognitive functioning Area of Impairment: Orientation, Attention, Memory, Following commands, Safety/judgement, Awareness, Problem solving                 Orientation Level: Disoriented to, Time Current Attention Level: Sustained Memory: Decreased short-term memory Following Commands: Follows one step commands with increased time Safety/Judgement: Decreased awareness of deficits Awareness: Intellectual Problem Solving: Slow processing, Requires verbal cues, Requires tactile cues          General Comments General comments (skin integrity, edema, etc.): vital signs appear stable throughout    Exercises     Assessment/Plan    PT Assessment Patient needs continued PT services  PT Problem List Decreased activity tolerance;Decreased mobility;Decreased balance;Decreased knowledge of use of DME;Decreased strength;Decreased safety awareness       PT Treatment Interventions DME instruction;Therapeutic exercise;Gait training;Stair training;Functional mobility training;Therapeutic activities;Patient/family education;Neuromuscular  re-education;Balance training    PT Goals (Current goals can be found in the Care Plan section)  Acute Rehab PT Goals Patient Stated Goal: get better PT Goal Formulation: With patient Time For Goal Achievement: 06/05/22 Potential to Achieve Goals: Good    Frequency Min 2X/week     Co-evaluation PT/OT/SLP Co-Evaluation/Treatment: Yes Reason for Co-Treatment: Complexity of the patient's impairments (multi-system involvement);Necessary to address cognition/behavior during functional activity;For patient/therapist safety PT goals addressed during session: Mobility/safety with mobility;Balance;Proper use of DME OT goals addressed during session: ADL's and self-care       AM-PAC PT "6 Clicks" Mobility  Outcome Measure Help needed turning from your back to your side while in a flat bed without using bedrails?: A Little Help needed moving from lying on your back to sitting on the side of a flat bed without using bedrails?: A Little Help needed moving to and from a bed to a chair (including a wheelchair)?: A Little Help needed standing up from a chair using your arms (e.g., wheelchair or bedside chair)?: A Little Help needed to walk in hospital room?: A Little Help needed climbing 3-5 steps with a railing? : A Lot 6 Click Score: 17    End of Session   Activity Tolerance: Patient tolerated treatment well Patient left:  (in ED stretcher in care of OT) Nurse Communication: Mobility status PT Visit Diagnosis: Unsteadiness on feet (R26.81);Muscle weakness (generalized) (M62.81)    Time: 9242-6834 PT Time Calculation (min) (ACUTE ONLY): 13 min   Charges:   PT Evaluation $PT Eval Low Complexity: 1 Low  Leah Holmes, PT, DPT 05/22/22, 1:05 PM   Leah Holmes 05/22/2022, 1:03 PM

## 2022-05-22 NOTE — Progress Notes (Signed)
[facility] for warfarin Indication: atrial fibrillation s/p pacemaker  Allergies  Allergen Reactions   Codeine Anaphylaxis   Penicillins Anaphylaxis    Has patient had a PCN reaction causing immediate rash, facial/tongue/throat swelling, SOB or lightheadedness with hypotension: Yes Has patient had a PCN reaction causing severe rash involving mucus membranes or skin necrosis: No Has patient had a PCN reaction that required hospitalization Yes Has patient had a PCN reaction occurring within the last 10 years: No If all of the above answers are "NO", then may proceed with Cephalosporin use.   Amlodipine Other (See Comments)    Diffuse itching    Atorvastatin     Other reaction(s): Unknown   Clonidine Derivatives Other (See Comments)    Reaction:  Unknown    Gabapentin Nausea Only   Losartan Other (See Comments)    Other reaction(s): Other (See Comments) Reaction:  Muscle aches  Reaction:  Muscle aches    Morphine     Other reaction(s): Unknown   Morphine And Related Other (See Comments)    Reaction:  Unknown    Reglan [Metoclopramide] Other (See Comments)    Reaction:  Tremors    Valtrex [Valacyclovir] Other (See Comments)    Tremors    Hydralazine Anxiety   Spironolactone Itching    Patient Measurements: Height: '5\' 6"'$  (167.6 cm) Weight: 53.2 kg (117 lb 4.6 oz) IBW/kg (Calculated) : 59.3  Vital Signs: BP: 168/70 (12/08 0634) Pulse Rate: 67 (12/08 1232)  Labs: Recent Labs    05/21/22 1339 05/21/22 1405 05/21/22 1405 05/21/22 1607 05/21/22 2054 05/22/22 0212 05/22/22 0514  HGB 14.8  --   --   --   --   --  15.4*  HCT 44.7  --   --   --   --   --  47.2*  PLT 209  --   --   --   --   --  182  APTT  --  42*  --   --   --   --   --   LABPROT  --  22.6*  --   --   --   --  19.3*  INR  --  2.0*  --   --   --   --  1.6*  CREATININE  --  SPECIMEN CONTAMINATED, UNABLE TO PERFORM TEST(S).   < > 0.51  --  0.44 0.36*  TROPONINIHS  --    --   --  6 7  --   --    < > = values in this interval not displayed.     Estimated Creatinine Clearance: 36.9 mL/min (A) (by C-G formula based on SCr of 0.36 mg/dL (L)).   Medical History: Past Medical History:  Diagnosis Date   Atrial fibrillation ([place]) 2012   Breast cancer ([place]) 04/2018   IDC of left breast    CHF (congestive heart failure) ([place])    Colon polyp 2003   Compression fracture of L2 lumbar vertebra (HCC)    Coronary artery disease    COVID-19 virus infection 12/25/2019   Diabetes mellitus without complication (HCC)    Dyspnea    Dysrhythmia    atrial fib   Environmental and seasonal allergies    GERD (gastroesophageal reflux disease)    Hyperlipidemia    Hypertension    Hypertensive urgency 03/10/2021   Irritable bowel syndrome    Multiple rib fractures 03/15/2018   Personal history of radiation therapy    Polyp  of colon    PONV (postoperative nausea and vomiting)    with ether, not recently "woke up in surgery once"   Presence of permanent cardiac pacemaker    Tremor    Tremors of nervous system    Ulcer     Medications:  Warfarin 2 mg Sun & Wed and 4 mg all other days (INR goal 2-3 per outpatient notes). Last dose 12/6 PM   Assessment: 88 year old female with past medical history atrial fibrillation (Chadsvasc at least 7), valvular heart disease, pacemaker placement 2017, CHF, CAD, T2DM,  HLD, HTN. Presenting with altered mental status. Pharmacy has been consulted to manage warfarin.   Baseline H&H stable.  Drug-Drug Interactions: Atenolol: decreased INR (PTA and inpatient)   Date INR Warfarin Dose  12/6 -- 2 mg (PTA)  12/'7 2 4 '$ mg  12/8 1.6 6 mg    Goal of Therapy:  INR 2-3 Monitor platelets by anticoagulation protocol: Yes   Plan: INR subtherapeutic Give warfarin 6 mg (50% greater than home dose) x 1 tonight Follow up INR and CBC tomorrow AM   Dallie Piles 05/22/2022,3:53 PM

## 2022-05-23 DIAGNOSIS — R531 Weakness: Secondary | ICD-10-CM | POA: Diagnosis not present

## 2022-05-23 LAB — BASIC METABOLIC PANEL
Anion gap: 5 (ref 5–15)
BUN: 9 mg/dL (ref 8–23)
CO2: 27 mmol/L (ref 22–32)
Calcium: 9.2 mg/dL (ref 8.9–10.3)
Chloride: 105 mmol/L (ref 98–111)
Creatinine, Ser: 0.49 mg/dL (ref 0.44–1.00)
GFR, Estimated: >60 mL/min (ref >60)
Glucose, Bld: 123 mg/dL — ABNORMAL HIGH (ref 70–99)
Potassium: 3.5 mmol/L (ref 3.5–5.1)
Sodium: 137 mmol/L (ref 135–145)

## 2022-05-23 LAB — PROTIME-INR
INR: 2.1 — ABNORMAL HIGH (ref 0.8–1.2)
Prothrombin Time: 23.4 seconds — ABNORMAL HIGH (ref 11.4–15.2)

## 2022-05-23 MED ORDER — WARFARIN SODIUM 4 MG PO TABS: 4.0000 mg | ORAL_TABLET | Freq: Once | ORAL | Status: DC

## 2022-05-23 MED ORDER — MAGNESIUM 30 MG PO TABS: 30.0000 mg | ORAL_TABLET | Freq: Two times a day (BID) | ORAL | 2 refills | Status: DC

## 2022-05-23 NOTE — Progress Notes (Signed)
[facility] for warfarin Indication: atrial fibrillation s/p pacemaker  Allergies  Allergen Reactions   Codeine Anaphylaxis   Penicillins Anaphylaxis    Has patient had a PCN reaction causing immediate rash, facial/tongue/throat swelling, SOB or lightheadedness with hypotension: Yes Has patient had a PCN reaction causing severe rash involving mucus membranes or skin necrosis: No Has patient had a PCN reaction that required hospitalization Yes Has patient had a PCN reaction occurring within the last 10 years: No If all of the above answers are "NO", then may proceed with Cephalosporin use.   Atorvastatin     Other reaction(s): Unknown   Clonidine Derivatives Other (See Comments)    Reaction:  Unknown    Gabapentin Nausea Only   Losartan Other (See Comments)    Other reaction(s): Other (See Comments) Reaction:  Muscle aches  Reaction:  Muscle aches    Morphine     Other reaction(s): Unknown   Morphine And Related Other (See Comments)    Reaction:  Unknown    Reglan [Metoclopramide] Other (See Comments)    Reaction:  Tremors    Valtrex [Valacyclovir] Other (See Comments)    Tremors    Amlodipine Other (See Comments)    Diffuse itching    Hydralazine Anxiety   Spironolactone Itching    Patient Measurements: Height: '5\' 6"'$  (167.6 cm) Weight: 53.2 kg (117 lb 4.6 oz) IBW/kg (Calculated) : 59.3  Vital Signs: Temp: 98.1 F (36.7 C) (12/09 0817) Temp Source: Oral (12/09 0634) BP: 143/64 (12/09 0925) Pulse Rate: 61 (12/09 0925)  Labs: Recent Labs    05/21/22 1339 05/21/22 1405 05/21/22 1405 05/21/22 1607 05/21/22 2054 05/22/22 0212 05/22/22 0514 05/23/22 0452  HGB 14.8  --   --   --   --   --  15.4*  --   HCT 44.7  --   --   --   --   --  47.2*  --   PLT 209  --   --   --   --   --  182  --   APTT  --  42*  --   --   --   --   --   --   LABPROT  --  22.6*  --   --   --   --  19.3* 23.4*  INR  --  2.0*  --   --   --   --  1.6*  2.1*  CREATININE  --  SPECIMEN CONTAMINATED, UNABLE TO PERFORM TEST(S).   < > 0.51  --  0.44 0.36* 0.49  TROPONINIHS  --   --   --  6 7  --   --   --    < > = values in this interval not displayed.     Estimated Creatinine Clearance: 36.9 mL/min (by C-G formula based on SCr of 0.49 mg/dL).   Medical History: Past Medical History:  Diagnosis Date   Atrial fibrillation ([place]) 2012   Breast cancer ([place]) 04/2018   IDC of left breast    CHF (congestive heart failure) ([place])    Colon polyp 2003   Compression fracture of L2 lumbar vertebra (HCC)    Coronary artery disease    COVID-19 virus infection 12/25/2019   Diabetes mellitus without complication (HCC)    Dyspnea    Dysrhythmia    atrial fib   Environmental and seasonal allergies    GERD (gastroesophageal reflux disease)    Hyperlipidemia  Hypertension    Hypertensive urgency 03/10/2021   Irritable bowel syndrome    Multiple rib fractures 03/15/2018   Personal history of radiation therapy    Polyp of colon    PONV (postoperative nausea and vomiting)    with ether, not recently "woke up in surgery once"   Presence of permanent cardiac pacemaker    Tremor    Tremors of nervous system    Ulcer     Medications:  Warfarin 2 mg Sun & Wed and 4 mg all other days (INR goal 2-3 per outpatient notes). Last dose 12/6 PM   Assessment: 88 year old female with past medical history atrial fibrillation (Chadsvasc at least 7), valvular heart disease, pacemaker placement 2017, CHF, CAD, T2DM,  HLD, HTN. Presenting with altered mental status. Pharmacy has been consulted to manage warfarin.   Baseline H&H stable.  Drug-Drug Interactions: Atenolol: decreased INR (PTA and inpatient)   Date INR Warfarin Dose  12/6 -- 2 mg (PTA)  12/'7 2 4 '$ mg  12/8 1.6 6 mg  12/9 2.1 4 mg    Goal of Therapy:  INR 2-3 Monitor platelets by anticoagulation protocol: Yes   Plan:  INR is therapeutic. Will give warfarin 4 mg x 1 (home dose). Daily INR.  CBC at least every 3 days.    Oswald Hillock, PharmD, BCPS 05/23/2022,9:54 AM

## 2022-05-23 NOTE — Discharge Summary (Signed)
Leah Holmes UUV:[id number] DOB: 06-26-1926 DOA: 05/21/2022  PCP: Crecencio Mc, MD  Admit date: 05/21/2022 Discharge date: 05/23/2022  Time spent: 40 minutes  Recommendations for Outpatient Follow-up:  Pcp f/u      Discharge Diagnoses:  Principal Problem:   Generalized weakness Active Problems:   CAD (coronary artery disease)   DM type 2 with diabetic peripheral neuropathy (HCC)   Essential hypertension, benign   Chronic atrial fibrillation (HCC)   Sick sinus syndrome (HCC)   Invasive ductal carcinoma of breast, female, left ([place])   Discharge Condition: stable  Diet recommendation: heart healthy  Filed Weights   05/21/22 1534  Weight: 53.2 kg    History of present illness:  From admission h and p Leah Holmes is an 88 y.o. female brought to the hospital for altered mental status.  Patient developed slurred speech and some transient patient is alert awake answers questions has benign essential tremor of the neck.  Is very weak and shaky.  Daughter is helping her from wheelchair to the bed.   Episode of AMS then brief few minutes of aphasia.  Neuro symptoms has resolved.  Labs are abnormal. Tremor in her upper extremity.   Hospital Course:  Patient presented after an episode of transient aphasia at home, concerning for possible TIA. Has a pacemaker so unable to perform MRI. CT of head showed nothing acute. Neurology was consulted, symptoms thought to not likely be CVA, and so resumption of home meds was advised. Patient has been steadily declining in terms of mental status and function. We will arrange for home health, and daughter was also given resources for seeking long-term care options. Patient was found to have low magnesium. This was repleted. Advised to hold her ppi and will start oral magnesium.  Procedures: none   Consultations: neurology  Discharge Exam: Vitals:   05/23/22 0817 05/23/22 0925  BP: 128/63 (!) 143/64  Pulse: 61 61   Resp: 15   Temp: 98.1 F (36.7 C)   SpO2: 97%     General: NAD Cardiovascular: RRR Respiratory: CTAB  Discharge Instructions   Discharge Instructions     Diet - low sodium heart healthy   Complete by: As directed    Increase activity slowly   Complete by: As directed       Allergies as of 05/23/2022       Reactions   Codeine Anaphylaxis   Penicillins Anaphylaxis   Has patient had a PCN reaction causing immediate rash, facial/tongue/throat swelling, SOB or lightheadedness with hypotension: Yes Has patient had a PCN reaction causing severe rash involving mucus membranes or skin necrosis: No Has patient had a PCN reaction that required hospitalization Yes Has patient had a PCN reaction occurring within the last 10 years: No If all of the above answers are "NO", then may proceed with Cephalosporin use.   Atorvastatin    Other reaction(s): Unknown   Clonidine Derivatives Other (See Comments)   Reaction:  Unknown    Gabapentin Nausea Only   Losartan Other (See Comments)   Other reaction(s): Other (See Comments) Reaction:  Muscle aches  Reaction:  Muscle aches    Morphine    Other reaction(s): Unknown   Morphine And Related Other (See Comments)   Reaction:  Unknown    Reglan [metoclopramide] Other (See Comments)   Reaction:  Tremors    Valtrex [valacyclovir] Other (See Comments)   Tremors    Amlodipine Other (See Comments)   Diffuse itching    Hydralazine Anxiety  Spironolactone Itching        Medication List     STOP taking these medications    omeprazole 20 MG capsule Commonly known as: PRILOSEC       TAKE these medications    acetaminophen 325 MG tablet Commonly known as: Tylenol Take 2 tablets (650 mg total) by mouth every 6 (six) hours as needed.   albuterol 108 (90 Base) MCG/ACT inhaler Commonly known as: ProAir HFA Inhale 1-2 puffs into the lungs every 4 (four) hours as needed for wheezing or shortness of breath.   amLODipine 10 MG  tablet Commonly known as: NORVASC Take 1 tablet (10 mg total) by mouth daily.   atenolol 25 MG tablet Commonly known as: TENORMIN Take 25 mg by mouth 2 (two) times daily.   docusate sodium 100 MG capsule Commonly known as: COLACE Take 2 capsules (200 mg total) by mouth 2 (two) times daily.   hydrochlorothiazide 25 MG tablet Commonly known as: HYDRODIURIL Take 12.5 mg by mouth daily.   ipratropium 0.03 % nasal spray Commonly known as: ATROVENT Place 2 sprays into both nostrils every 12 (twelve) hours.   isosorbide mononitrate 60 MG 24 hr tablet Commonly known as: IMDUR Take 60 mg by mouth 2 (two) times daily.   lisinopril 40 MG tablet Commonly known as: ZESTRIL Take 1 tablet by mouth twice daily   magnesium 30 MG tablet Take 1 tablet (30 mg total) by mouth 2 (two) times daily.   meclizine 25 MG tablet Commonly known as: ANTIVERT Take 1 tablet by mouth three times daily as needed   miconazole 2 % cream Commonly known as: MICOTIN Apply 1 application. topically 2 (two) times daily.   mometasone 0.1 % cream Commonly known as: ELOCON Apply once ot twice daily as needed for itching   nitroGLYCERIN 0.4 MG SL tablet Commonly known as: NITROSTAT Place 0.4 mg under the tongue every 5 (five) minutes as needed for chest pain.   OneTouch Delica Plus [id number] Misc Place 2 Devices inside cheek daily. DX Code E11.9.   OneTouch Ultra test strip Generic drug: glucose blood USE 1 STRIP TO CHECK GLUCOSE TWICE DAILY   glucose blood test strip Commonly known as: ONE TOUCH ULTRA TEST USE 1 STRIP TO CHECK GLUCOSE TWICE DAILY  Dx code: E11.9   glucose blood test strip Commonly known as: ONE TOUCH ULTRA TEST USE ONE STRIP TO CHECK GLUCOSE ONCE  DAILY   glucose blood test strip Use to check blood sugars once daily.   Polyethyl Glycol-Propyl Glycol 0.4-0.3 % Soln Place 1 drop into both eyes 3 (three) times daily as needed (for dry/irritated eyes.).   polyethylene glycol 17 g  packet Commonly known as: MIRALAX / GLYCOLAX Take 17 g by mouth daily as needed.   psyllium 58.6 % packet Commonly known as: METAMUCIL Take 1 packet by mouth daily as needed (for regularity).   triamcinolone cream 0.1 % Commonly known as: KENALOG APPLY 1 APPLICATION TOPICALLY TWICE DAILY   Vitamin D3 25 MCG (1000 UT) Caps Take 1,000 Units by mouth every morning.   warfarin 4 MG tablet Commonly known as: COUMADIN Take 4 mg by mouth at bedtime. Does not take Wednesday or Sunday.   warfarin 2 MG tablet Commonly known as: COUMADIN Take 2 mg by mouth 2 (two) times a week. Take '2mg'$  at bedtime on Wednesday & Sunday.       Allergies  Allergen Reactions   Codeine Anaphylaxis   Penicillins Anaphylaxis    Has patient had  a PCN reaction causing immediate rash, facial/tongue/throat swelling, SOB or lightheadedness with hypotension: Yes Has patient had a PCN reaction causing severe rash involving mucus membranes or skin necrosis: No Has patient had a PCN reaction that required hospitalization Yes Has patient had a PCN reaction occurring within the last 10 years: No If all of the above answers are "NO", then may proceed with Cephalosporin use.   Atorvastatin     Other reaction(s): Unknown   Clonidine Derivatives Other (See Comments)    Reaction:  Unknown    Gabapentin Nausea Only   Losartan Other (See Comments)    Other reaction(s): Other (See Comments) Reaction:  Muscle aches  Reaction:  Muscle aches    Morphine     Other reaction(s): Unknown   Morphine And Related Other (See Comments)    Reaction:  Unknown    Reglan [Metoclopramide] Other (See Comments)    Reaction:  Tremors    Valtrex [Valacyclovir] Other (See Comments)    Tremors    Amlodipine Other (See Comments)    Diffuse itching    Hydralazine Anxiety   Spironolactone Itching    Follow-up Information     Crecencio Mc, MD Follow up.   Specialty: Internal Medicine Contact information: [place]  [place] [address] [phone]                  The results of significant diagnostics from this hospitalization (including imaging, microbiology, ancillary and laboratory) are listed below for reference.    Significant Diagnostic Studies: CT HEAD WO CONTRAST  Result Date: 05/21/2022 CLINICAL DATA:  Altered mental status EXAM: CT HEAD WITHOUT CONTRAST TECHNIQUE: Contiguous axial images were obtained from the base of the skull through the vertex without intravenous contrast. RADIATION DOSE REDUCTION: This exam was performed according to the departmental dose-optimization program which includes automated exposure control, adjustment of the mA and/or kV according to patient size and/or use of iterative reconstruction technique. COMPARISON:  07/14/2021 FINDINGS: Brain: No evidence of acute infarction, hemorrhage, mass, mass effect, or midline shift. No hydrocephalus or extra-axial fluid collection. Periventricular white matter changes, likely the sequela of chronic small vessel ischemic disease. Age related cerebral atrophy. Right basal ganglia lacunar infarct. Vascular: No hyperdense vessel. Atherosclerotic calcifications in the intracranial carotid and vertebral arteries. Skull: Normal. Negative for fracture or focal lesion. Sinuses/Orbits: No acute finding. Status post left lens replacement. Other: The mastoid air cells are well aerated. IMPRESSION: No acute intracranial process. Electronically Signed   By: Merilyn Baba M.D.   On: 05/21/2022 14:06    Microbiology: Recent Results (from the past 240 hour(s))  Resp Panel by RT-PCR (Flu A&B, Covid) Anterior Nasal Swab     Status: None   Collection Time: 05/21/22  4:05 PM   Specimen: Anterior Nasal Swab  Result Value Ref Range Status   SARS Coronavirus 2 by RT PCR NEGATIVE NEGATIVE Final    Comment: (NOTE) SARS-CoV-2 target nucleic acids are NOT DETECTED.  The SARS-CoV-2 RNA is generally detectable in upper respiratory specimens  during the acute phase of infection. The lowest concentration of SARS-CoV-2 viral copies this assay can detect is 138 copies/mL. A negative result does not preclude SARS-Cov-2 infection and should not be used as the sole basis for treatment or other patient management decisions. A negative result may occur with  improper specimen collection/handling, submission of specimen other than nasopharyngeal swab, presence of viral mutation(s) within the areas targeted by this assay, and inadequate number of viral copies(<138 copies/mL).  A negative result must be combined with clinical observations, patient history, and epidemiological information. The expected result is Negative.  Fact Sheet for Patients:  EntrepreneurPulse.com.au  Fact Sheet for Healthcare Providers:  IncredibleEmployment.be  This test is no t yet approved or cleared by the [place] FDA and  has been authorized for detection and/or diagnosis of SARS-CoV-2 by FDA under an Emergency Use Authorization (EUA). This EUA will remain  in effect (meaning this test can be used) for the duration of the COVID-19 declaration under Section 564(b)(1) of the Act, 21 U.S.C.section 360bbb-3(b)(1), unless the authorization is terminated  or revoked sooner.       Influenza A by PCR NEGATIVE NEGATIVE Final   Influenza B by PCR NEGATIVE NEGATIVE Final    Comment: (NOTE) The Xpert Xpress SARS-CoV-2/FLU/RSV plus assay is intended as an aid in the diagnosis of influenza from Nasopharyngeal swab specimens and should not be used as a sole basis for treatment. Nasal washings and aspirates are unacceptable for Xpert Xpress SARS-CoV-2/FLU/RSV testing.  Fact Sheet for Patients: EntrepreneurPulse.com.au  Fact Sheet for Healthcare Providers: IncredibleEmployment.be  This test is not yet approved or cleared by the [place] FDA and has been authorized for detection  and/or diagnosis of SARS-CoV-2 by FDA under an Emergency Use Authorization (EUA). This EUA will remain in effect (meaning this test can be used) for the duration of the COVID-19 declaration under Section 564(b)(1) of the Act, 21 U.S.C. section 360bbb-3(b)(1), unless the authorization is terminated or revoked.  Performed at [facility], [address] [place], [place] [address]      Labs: Basic Metabolic Panel: Recent Labs  Lab 05/21/22 1405 05/21/22 1607 05/22/22 0212 05/22/22 0514 05/23/22 0452  NA SPECIMEN CONTAMINATED, UNABLE TO PERFORM TEST(S). 137 136 135 137  K SPECIMEN CONTAMINATED, UNABLE TO PERFORM TEST(S). 3.8 3.9 3.6 3.5  CL SPECIMEN CONTAMINATED, UNABLE TO PERFORM TEST(S). 100 104 105 105  CO2 SPECIMEN CONTAMINATED, UNABLE TO PERFORM TEST(S). '26 26 24 27  '$ GLUCOSE SPECIMEN CONTAMINATED, UNABLE TO PERFORM TEST(S). 135* 129* 129* 123*  BUN SPECIMEN CONTAMINATED, UNABLE TO PERFORM TEST(S). '14 11 10 9  '$ CREATININE SPECIMEN CONTAMINATED, UNABLE TO PERFORM TEST(S). 0.51 0.44 0.36* 0.49  CALCIUM SPECIMEN CONTAMINATED, UNABLE TO PERFORM TEST(S). 9.6 8.8* 9.2 9.2  MG 1.0*  --   --  2.4  --    Liver Function Tests: Recent Labs  Lab 05/21/22 1405 05/21/22 1607 05/22/22 0212 05/22/22 0514  AST SPECIMEN CONTAMINATED, UNABLE TO PERFORM TEST(S). '28 27 29  '$ ALT SPECIMEN CONTAMINATED, UNABLE TO PERFORM TEST(S). '21 19 21  '$ ALKPHOS SPECIMEN CONTAMINATED, UNABLE TO PERFORM TEST(S). 60 50 59  BILITOT SPECIMEN CONTAMINATED, UNABLE TO PERFORM TEST(S). 0.9 1.2 1.2  PROT SPECIMEN CONTAMINATED, UNABLE TO PERFORM TEST(S). 7.6 6.0* 7.1  ALBUMIN SPECIMEN CONTAMINATED, UNABLE TO PERFORM TEST(S). 4.2 3.3* 3.9   No results for input(s): "LIPASE", "AMYLASE" in the last 168 hours. No results for input(s): "AMMONIA" in the last 168 hours. CBC: Recent Labs  Lab 05/21/22 1339 05/22/22 0514  WBC 5.6 4.1  NEUTROABS 4.0  --   HGB 14.8 15.4*  HCT 44.7 47.2*  MCV 95.9 95.7  PLT 209  182   Cardiac Enzymes: No results for input(s): "CKTOTAL", "CKMB", "CKMBINDEX", "TROPONINI" in the last 168 hours. BNP: BNP (last 3 results) No results for input(s): "BNP" in the last 8760 hours.  ProBNP (last 3 results) No results for input(s): "PROBNP" in the last 8760 hours.  CBG: Recent Labs  Lab 05/21/22 1136 05/21/22  [place]:  Desma Maxim MD.  [facility] 05/23/2022, 10:46 AM

## 2022-05-23 NOTE — Progress Notes (Signed)
Received MD order to discharge patient to home, reviewed homes meds, floowo up appointments and prescriptions with patient and daughter and both verbalized understanding.

## 2022-05-23 NOTE — TOC Transition Note (Signed)
Transition of Care [facility]) - CM/SW Discharge Note   Patient Details  Name: Leah Holmes MRN: [id number] Date of Birth: April 16, 1927  Transition of Care [facility]) CM/SW Contact:  Valente David, RN Phone Number: 05/23/2022, 11:06 AM   Clinical Narrative:     Patient preparing for discharge today.  PT/OT initially recommended SNF, however patient has had a slow decline, SNF likely not to help.  She lives with daughter, who agrees to discharge home with home health PT/OT.  Spoke with Corene Cornea at [facility], they have accepted patient and will be able to start services on Monday.  Patient has walker and shower chair in the home, PCP is Dr. Derrel Nip at [facility] at [place], uses [facility] in [place].  Daughter report she is interested in long term care options as she is the only caregiver.  Will notify [facility] to follow patient for care coordination services for [facility] and CSW after discharge.    Final next level of care: [place] Barriers to Discharge: Barriers Resolved   Patient Goals and CMS Choice Patient states their goals for this hospitalization and ongoing recovery are:: Per daughter, home with home health CMS Medicare.gov Compare Post Acute Care list provided to:: Patient Represenative (must comment) [name]) Choice offered to / list presented to : Adult Children  Discharge Placement                       Discharge Plan and Services     Post Acute Care Choice: Home Health                    HH Arranged: PT, OT [facility] Agency: [place] ([facility]) Date [place]: 05/23/22 Time [place]: 69 Representative spoke with at [place]: Corene Cornea  Social Determinants of Health ([place]) Interventions     Readmission Risk Interventions     No data to display

## 2022-05-25 ENCOUNTER — Telehealth: Payer: Self-pay | Admitting: *Deleted

## 2022-05-25 NOTE — Patient Outreach (Signed)
  Care Coordination TOC Note Transition Care Management Unsuccessful Follow-up Telephone Call  Date of discharge and from where:  [facility] [phone]   Attempts:  1st Attempt  Reason for unsuccessful TCM follow-up call:  Unable to leave message Patient answered but stated she was so hard of hearing and I would need to talk with there daughter [name].  [place] Care Management [phone]

## 2022-05-26 ENCOUNTER — Telehealth: Payer: Self-pay | Admitting: Internal Medicine

## 2022-05-26 ENCOUNTER — Telehealth: Payer: Self-pay

## 2022-05-26 NOTE — Telephone Encounter (Signed)
MyChart messgae sent to patient. 

## 2022-05-26 NOTE — Telephone Encounter (Signed)
Error

## 2022-05-26 NOTE — Telephone Encounter (Signed)
Pt was discharged from the hospital on Friday and the doctor there wrote a prescription for magnesium but the way he wrote it, [facility] can not fill it that way. The prescription has to be changed. Pt daughter stated she has been getting the run around and was wondering if the provider can help the pt

## 2022-05-26 NOTE — Patient Outreach (Signed)
  Care Coordination TOC Note Transition Care Management Unsuccessful Follow-up Telephone Call  Date of discharge and from where:  05/23/22-[facility]  Attempts:  2nd Attempt  Reason for unsuccessful TCM follow-up call:  No answer/busy    Enzo [place], RN,BSN,CCM [place] Management Telephonic Care Management Coordinator Direct Phone: [phone] Toll Free: [phone] Fax: [phone]

## 2022-05-27 ENCOUNTER — Telehealth: Payer: Self-pay | Admitting: *Deleted

## 2022-05-27 NOTE — Telephone Encounter (Signed)
Spoke with pt's daughter ans she stated that the nurse she spoke with earlier had already advised her that pt is to take the OTC magnesium 300 mg.

## 2022-05-27 NOTE — Patient Outreach (Signed)
  Care Coordination [facility] Note Transition Care Management Follow-up Telephone Call Date of discharge and from where: [facility] [phone] How have you been since you were released from the hospital? Doing better Any questions or concerns? Yes The Hospitalist ordered magnesium 30 mg bid on discharge. The daughter went to get this filled and the pharmacist told her they didn't have this.  Items Reviewed: Did the pt receive and understand the discharge instructions provided? Yes  Medications obtained and verified? Yes  Other? Yes Medication discussed with PCP. Changes made Any new allergies since your discharge? No  Dietary orders reviewed? No Do you have support at home? Yes   Home Care and Equipment/Supplies: Were home health services ordered? yes If so, what is the name of the agency? [facility]  Has the agency set up a time to come to the patient's home? No RN gave patient daughter the name and number to contact agency Were any new equipment or medical supplies ordered?  No What is the name of the medical supply agency? N  Were you able to get the supplies/equipment? no Do you have any questions related to the use of the equipment or supplies? No  Functional Questionnaire: (I = Independent and D = Dependent) ADLs: D  Bathing/Dressing- D  Meal Prep- D  Eating- D  Maintaining continence- D  Transferring/Ambulation- D  Managing Meds- D  Follow up appointments reviewed:  PCP Hospital f/u appt confirmed? Yes  Dr Derrel Nip [phone] 11:00. [facility] f/u appt confirmed? No  . Are transportation arrangements needed? No  If their condition worsens, is the pt aware to call PCP or go to the Emergency Dept.? Yes Was the patient provided with contact information for the PCP's office or ED? Yes Was to pt encouraged to call back with questions or concerns? Yes  SDOH assessments and interventions completed:   Yes SDOH Interventions Today    Flowsheet Row Most Recent Value  SDOH  Interventions   Food Insecurity Interventions Intervention Not Indicated  Housing Interventions Intervention Not Indicated  Transportation Interventions Intervention Not Indicated       Care Coordination Interventions:  RN contacted Dr to follow up on medication Magnesium dosage    Encounter Outcome:  Pt. Visit Completed    [place] [facility] [phone]

## 2022-05-29 ENCOUNTER — Ambulatory Visit: Payer: Self-pay | Admitting: *Deleted

## 2022-05-29 NOTE — Patient Instructions (Signed)
Visit Information  Thank you for taking time to visit with me today. Please don't hesitate to contact me if I can be of assistance to you.   Following are the goals we discussed today:   Goals Addressed             This Visit's Progress    Facility care questions       Care Coordination Interventions: Phone call to patient's daughter to discuss placement options for patient Patient currently lives with daughter and son in law-they are patient's primary caregivers, however patient's care needs are increasing Level of Care needs discussed as well as long term care medicaid application process Patient's daughter confirmed that patient was previously in respite care at [facility] and would like this facility to be a option for long term care Discussed Day Program options as well, however per patient's daughter, patient is not interested in these options Patient's daughter will consider all options and will contact this social worker when ready to move forward with placement  Solution-Focused Strategies employed:  Active listening / Reflection utilized  Caregiver stress acknowledged           Please call the care guide team at [phone] if you need to cancel or reschedule your appointment.   If you are experiencing a Mental Health or [place] or need someone to talk to, please call 911   Patient verbalizes understanding of instructions and care plan provided today and agrees to view in [place]. Active MyChart status and patient understanding of how to access instructions and care plan via MyChart confirmed with patient.     Telephone follow up appointment with care management team member scheduled for: No further follow up required: patient's daughter to contact this Education officer, museum with any additional community resource needs  [place], [place] Worker  [facility] Care Management [phone]

## 2022-05-29 NOTE — Patient Outreach (Signed)
  Care Coordination   Initial Visit Note   05/29/2022 Name: Leah Holmes MRN: [id number] DOB: 02/25/1927  Leah Holmes is a 88 y.o. year old female who sees Derrel Nip, Aris Everts, MD for primary care. I spoke with  Wray Kearns Dolinger's daughter by phone today.  What matters to the patients health and wellness today?  Facility placement in near future    Goals Addressed             This [place] care questions       Care Coordination Interventions: Phone call to patient's daughter to discuss placement options for patient Patient currently lives with daughter and son in law-they are patient's primary caregivers, however patient's care needs are increasing Level of Care needs discussed as well as long term care medicaid application process Patient's daughter confirmed that patient was previously in respite care at [facility] and would like this facility to be a option for long term care Discussed Day Program options as well, however per patient's daughter, patient is not interested in these options Patient's daughter will consider all options and will contact this social worker when ready to move forward with placement  Solution-Focused Strategies employed:  Active listening / Reflection utilized  Caregiver stress acknowledged          SDOH assessments and interventions completed:  Yes  SDOH Interventions Today    Flowsheet Row Most Recent Value  SDOH Interventions   Food Insecurity Interventions Intervention Not Indicated  Housing Interventions Intervention Not Indicated  Transportation Interventions Intervention Not Indicated        Care Coordination Interventions:  Yes, provided   Follow up plan: No further intervention required.   Encounter Outcome:  Pt. Visit Completed

## 2022-06-02 DIAGNOSIS — I110 Hypertensive heart disease with heart failure: Secondary | ICD-10-CM | POA: Diagnosis not present

## 2022-06-02 DIAGNOSIS — K589 Irritable bowel syndrome without diarrhea: Secondary | ICD-10-CM | POA: Diagnosis not present

## 2022-06-02 DIAGNOSIS — R9082 White matter disease, unspecified: Secondary | ICD-10-CM | POA: Diagnosis not present

## 2022-06-02 DIAGNOSIS — Z87891 Personal history of nicotine dependence: Secondary | ICD-10-CM | POA: Diagnosis not present

## 2022-06-02 DIAGNOSIS — E1142 Type 2 diabetes mellitus with diabetic polyneuropathy: Secondary | ICD-10-CM | POA: Diagnosis not present

## 2022-06-02 DIAGNOSIS — E785 Hyperlipidemia, unspecified: Secondary | ICD-10-CM | POA: Diagnosis not present

## 2022-06-02 DIAGNOSIS — Z853 Personal history of malignant neoplasm of breast: Secondary | ICD-10-CM | POA: Diagnosis not present

## 2022-06-02 DIAGNOSIS — I495 Sick sinus syndrome: Secondary | ICD-10-CM | POA: Diagnosis not present

## 2022-06-02 DIAGNOSIS — Z8673 Personal history of transient ischemic attack (TIA), and cerebral infarction without residual deficits: Secondary | ICD-10-CM | POA: Diagnosis not present

## 2022-06-02 DIAGNOSIS — K219 Gastro-esophageal reflux disease without esophagitis: Secondary | ICD-10-CM | POA: Diagnosis not present

## 2022-06-02 DIAGNOSIS — I509 Heart failure, unspecified: Secondary | ICD-10-CM | POA: Diagnosis not present

## 2022-06-02 DIAGNOSIS — Z7901 Long term (current) use of anticoagulants: Secondary | ICD-10-CM | POA: Diagnosis not present

## 2022-06-02 DIAGNOSIS — I2510 Atherosclerotic heart disease of native coronary artery without angina pectoris: Secondary | ICD-10-CM | POA: Diagnosis not present

## 2022-06-02 DIAGNOSIS — I482 Chronic atrial fibrillation, unspecified: Secondary | ICD-10-CM | POA: Diagnosis not present

## 2022-06-02 DIAGNOSIS — R251 Tremor, unspecified: Secondary | ICD-10-CM | POA: Diagnosis not present

## 2022-06-02 DIAGNOSIS — Z87311 Personal history of (healed) other pathological fracture: Secondary | ICD-10-CM | POA: Diagnosis not present

## 2022-06-02 DIAGNOSIS — Z9181 History of falling: Secondary | ICD-10-CM | POA: Diagnosis not present

## 2022-06-02 DIAGNOSIS — Z8616 Personal history of COVID-19: Secondary | ICD-10-CM | POA: Diagnosis not present

## 2022-06-04 ENCOUNTER — Telehealth: Payer: Self-pay | Admitting: Internal Medicine

## 2022-06-04 NOTE — Telephone Encounter (Signed)
error 

## 2022-06-09 ENCOUNTER — Telehealth: Payer: Self-pay | Admitting: Internal Medicine

## 2022-06-09 NOTE — Telephone Encounter (Signed)
Colletta Maryland was given verbal okay to start OT this week.

## 2022-06-09 NOTE — Telephone Encounter (Signed)
LMTCB

## 2022-06-09 NOTE — Telephone Encounter (Signed)
Stephanie from [facility] called wanting to change patient OT therapy to this week

## 2022-06-09 NOTE — Telephone Encounter (Signed)
Colletta [place] returned your call.

## 2022-06-10 ENCOUNTER — Encounter: Payer: Self-pay | Admitting: Internal Medicine

## 2022-06-10 ENCOUNTER — Ambulatory Visit (INDEPENDENT_AMBULATORY_CARE_PROVIDER_SITE_OTHER): Payer: Medicare Other | Admitting: Internal Medicine

## 2022-06-10 DIAGNOSIS — Z09 Encounter for follow-up examination after completed treatment for conditions other than malignant neoplasm: Secondary | ICD-10-CM

## 2022-06-10 DIAGNOSIS — R531 Weakness: Secondary | ICD-10-CM

## 2022-06-10 DIAGNOSIS — Z23 Encounter for immunization: Secondary | ICD-10-CM | POA: Diagnosis not present

## 2022-06-10 LAB — BASIC METABOLIC PANEL
BUN: 9 mg/dL (ref 6–23)
CO2: 35 mEq/L — ABNORMAL HIGH (ref 19–32)
Calcium: 9.7 mg/dL (ref 8.4–10.5)
Chloride: 97 mEq/L (ref 96–112)
Creatinine, Ser: 0.61 mg/dL (ref 0.40–1.20)
GFR: 76.88 mL/min (ref >60.00)
Glucose, Bld: 132 mg/dL — ABNORMAL HIGH (ref 70–99)
Potassium: 4.1 mEq/L (ref 3.5–5.1)
Sodium: 136 mEq/L (ref 135–145)

## 2022-06-10 LAB — MAGNESIUM: Magnesium: 1.9 mg/dL (ref 1.5–2.5)

## 2022-06-10 NOTE — Progress Notes (Unsigned)
Subjective:  Patient ID: Leah Holmes, female    DOB: 15-Sep-1926  Age: 88 y.o. MRN: [id number]  CC: There were no encounter diagnoses.   HPI Leah Holmes presents for  Chief Complaint  Patient presents with   Hospitalization Follow-up   ADMITTED TO [facility] IN DEC 7 with transient aphasia, garbled speech and generalized weakness. Neurology CONSULT DONE AND CT head no acute changes Hypokalemia and hyopmagnesemia diagnosed : Mg was 1.0 and K was "low") so she was given IV potassium and iv calciun     and treated  and she was dc'd home on Dec 9 with home health ordered and resources for long term care.   Mg was 2.5 at dc and k was 3.5     Omeprazole was stopped,  magnesium prescribed. She had not been taking omeprazole  on a daily basis, just as needed ,  for early morning nausea.  Magnesium capsule was too large so she  has not been taking any .  No diarrhee.  Uses prune juice .  Appetite small.  Supplements  with ensure milk shakes   Legs bothered by neuropathy  Lab Results  Component Value Date   HGBA1C 6.9 (H) 04/08/2022      Outpatient Medications Prior to Visit  Medication Sig Dispense Refill   acetaminophen (TYLENOL) 325 MG tablet Take 2 tablets (650 mg total) by mouth every 6 (six) hours as needed.     albuterol (PROAIR HFA) 108 (90 Base) MCG/ACT inhaler Inhale 1-2 puffs into the lungs every 4 (four) hours as needed for wheezing or shortness of breath. 1 Inhaler 5   amLODipine (NORVASC) 10 MG tablet Take 1 tablet (10 mg total) by mouth daily. 30 tablet 0   atenolol (TENORMIN) 25 MG tablet Take 25 mg by mouth 2 (two) times daily.     Cholecalciferol (VITAMIN D3) 1000 UNITS CAPS Take 1,000 Units by mouth every morning.      docusate sodium (COLACE) 100 MG capsule Take 2 capsules (200 mg total) by mouth 2 (two) times daily. 60 capsule 0   glucose blood (ONE TOUCH ULTRA TEST) test strip USE 1 STRIP TO CHECK GLUCOSE TWICE DAILY  Dx code: E11.9 200 each 3    glucose blood (ONE TOUCH ULTRA TEST) test strip USE ONE STRIP TO CHECK GLUCOSE ONCE  DAILY 100 each 3   glucose blood test strip Use to check blood sugars once daily. 100 each 12   hydrochlorothiazide (HYDRODIURIL) 25 MG tablet Take 12.5 mg by mouth daily.     ipratropium (ATROVENT) 0.03 % nasal spray Place 2 sprays into both nostrils every 12 (twelve) hours. 30 mL 12   isosorbide mononitrate (IMDUR) 60 MG 24 hr tablet Take 60 mg by mouth 2 (two) times daily.     Lancets (ONETOUCH DELICA PLUS [id number]) [place] [facility] 2 Devices inside cheek daily. DX Code E11.9. 100 each 2   lisinopril (ZESTRIL) 40 MG tablet Take 1 tablet by mouth twice daily 180 tablet 0   meclizine (ANTIVERT) 25 MG tablet Take 1 tablet by mouth three times daily as needed 40 tablet 0   miconazole (MICOTIN) 2 % cream Apply 1 application. topically 2 (two) times daily. 28.35 g 0   mometasone (ELOCON) 0.1 % cream Apply once ot twice daily as needed for itching 15 g 1   nitroGLYCERIN (NITROSTAT) 0.4 MG SL tablet Place 0.4 mg under the tongue every 5 (five) minutes as needed for chest pain.     ONETOUCH  ULTRA test strip USE 1 STRIP TO CHECK GLUCOSE TWICE DAILY 100 each 0   Polyethyl Glycol-Propyl Glycol 0.4-0.3 % SOLN Place 1 drop into both eyes 3 (three) times daily as needed (for dry/irritated eyes.).     polyethylene glycol (MIRALAX / GLYCOLAX) 17 g packet Take 17 g by mouth daily as needed.     psyllium (METAMUCIL) 58.6 % packet Take 1 packet by mouth daily as needed (for regularity).      triamcinolone cream (KENALOG) 0.1 % APPLY 1 APPLICATION TOPICALLY TWICE DAILY 30 g 0   warfarin (COUMADIN) 2 MG tablet Take 2 mg by mouth 2 (two) times a week. Take '2mg'$  at bedtime on Wednesday & Sunday.     warfarin (COUMADIN) 4 MG tablet Take 4 mg by mouth at bedtime. Does not take Wednesday or Sunday.     magnesium 30 MG tablet Take 1 tablet (30 mg total) by mouth 2 (two) times daily. 60 tablet 2   No facility-administered medications prior to  visit.    Review of Systems;  Patient denies headache, fevers, malaise, unintentional weight loss, skin rash, eye pain, sinus congestion and sinus pain, sore throat, dysphagia,  hemoptysis , cough, dyspnea, wheezing, chest pain, palpitations, orthopnea, edema, abdominal pain, nausea, melena, diarrhea, constipation, flank pain, dysuria, hematuria, urinary  Frequency, nocturia, numbness, tingling, seizures,  Focal weakness, Loss of consciousness,  Tremor, insomnia, depression, anxiety, and suicidal ideation.      Objective:  BP 132/68   Pulse 68   Temp 97.8 F (36.6 C) (Oral)   Ht '5\' 6"'$  (1.676 m)   Wt 117 lb 6.4 oz (53.3 kg)   SpO2 97%   BMI 18.95 kg/m   BP Readings from Last 3 Encounters:  06/10/22 132/68  05/23/22 (!) 143/64  05/21/22 (!) 151/64    Wt Readings from Last 3 Encounters:  06/10/22 117 lb 6.4 oz (53.3 kg)  05/21/22 117 lb 4.6 oz (53.2 kg)  04/08/22 117 lb 3.2 oz (53.2 kg)    Physical Exam  Lab Results  Component Value Date   HGBA1C 6.9 (H) 04/08/2022   HGBA1C 7.2 (A) 09/01/2021   HGBA1C 7.5 (H) 05/26/2021    Lab Results  Component Value Date   CREATININE 0.49 05/23/2022   CREATININE 0.36 (L) 05/22/2022   CREATININE 0.44 05/22/2022    Lab Results  Component Value Date   WBC 4.1 05/22/2022   HGB 15.4 (H) 05/22/2022   HCT 47.2 (H) 05/22/2022   PLT 182 05/22/2022   GLUCOSE 123 (H) 05/23/2022   CHOL 174 09/13/2020   TRIG 63.0 09/13/2020   HDL 60.10 09/13/2020   LDLDIRECT 116.0 10/30/2015   LDLCALC 101 (H) 09/13/2020   ALT 21 05/22/2022   AST 29 05/22/2022   NA 137 05/23/2022   K 3.5 05/23/2022   CL 105 05/23/2022   CREATININE 0.49 05/23/2022   BUN 9 05/23/2022   CO2 27 05/23/2022   TSH 0.752 05/21/2022   INR 2.1 (H) 05/23/2022   HGBA1C 6.9 (H) 04/08/2022   MICROALBUR 1.0 04/08/2022    CT HEAD WO CONTRAST  Result Date: 05/21/2022 CLINICAL DATA:  Altered mental status EXAM: CT HEAD WITHOUT CONTRAST TECHNIQUE: Contiguous axial images  were obtained from the base of the skull through the vertex without intravenous contrast. RADIATION DOSE REDUCTION: This exam was performed according to the departmental dose-optimization program which includes automated exposure control, adjustment of the mA and/or kV according to patient size and/or use of iterative reconstruction technique. COMPARISON:  07/14/2021 FINDINGS:  Brain: No evidence of acute infarction, hemorrhage, mass, mass effect, or midline shift. No hydrocephalus or extra-axial fluid collection. Periventricular white matter changes, likely the sequela of chronic small vessel ischemic disease. Age related cerebral atrophy. Right basal ganglia lacunar infarct. Vascular: No hyperdense vessel. Atherosclerotic calcifications in the intracranial carotid and vertebral arteries. Skull: Normal. Negative for fracture or focal lesion. Sinuses/Orbits: No acute finding. Status post left lens replacement. Other: The mastoid air cells are well aerated. IMPRESSION: No acute intracranial process. Electronically Signed   By: Merilyn Baba M.D.   On: 05/21/2022 14:06    Assessment & Plan:  .There are no diagnoses linked to this encounter.   I provided 30 minutes of face-to-face time during this encounter reviewing patient's last visit with me, patient's  most recent visit with cardiology,  nephrology,  and neurology,  recent surgical and non surgical procedures, previous  labs and imaging studies, counseling on currently addressed issues,  and post visit ordering to diagnostics and therapeutics .   Follow-up: No follow-ups on file.   Crecencio Mc, MD

## 2022-06-10 NOTE — Patient Instructions (Signed)
  If the magnesium is low  today, you can break the capsules you have and add to applesauce or pudding  Let Ms Tsui eat whatever she wants, but encourage her to eat more PROTEIN

## 2022-06-11 DIAGNOSIS — Z09 Encounter for follow-up examination after completed treatment for conditions other than malignant neoplasm: Secondary | ICD-10-CM | POA: Insufficient documentation

## 2022-06-11 NOTE — Assessment & Plan Note (Signed)
Patient is stable post discharge and she and daughter Leah Holmes have no new issues or questions about discharge plans at the visit today for hospital follow up. All labs , imaging studies and progress notes from admission were reviewed with patient today

## 2022-06-11 NOTE — Assessment & Plan Note (Addendum)
Secondary to poor appetite and malnutrition, repeat level is normal as is potassium level

## 2022-06-11 NOTE — Assessment & Plan Note (Signed)
Secondary to extreme age and weight loss.  Home Health PT ordered during hospitalization.

## 2022-06-12 DIAGNOSIS — I2510 Atherosclerotic heart disease of native coronary artery without angina pectoris: Secondary | ICD-10-CM | POA: Diagnosis not present

## 2022-06-12 DIAGNOSIS — E1142 Type 2 diabetes mellitus with diabetic polyneuropathy: Secondary | ICD-10-CM | POA: Diagnosis not present

## 2022-06-12 DIAGNOSIS — I482 Chronic atrial fibrillation, unspecified: Secondary | ICD-10-CM | POA: Diagnosis not present

## 2022-06-12 DIAGNOSIS — I110 Hypertensive heart disease with heart failure: Secondary | ICD-10-CM | POA: Diagnosis not present

## 2022-06-12 DIAGNOSIS — I509 Heart failure, unspecified: Secondary | ICD-10-CM | POA: Diagnosis not present

## 2022-06-16 DIAGNOSIS — E1142 Type 2 diabetes mellitus with diabetic polyneuropathy: Secondary | ICD-10-CM | POA: Diagnosis not present

## 2022-06-16 DIAGNOSIS — I2510 Atherosclerotic heart disease of native coronary artery without angina pectoris: Secondary | ICD-10-CM | POA: Diagnosis not present

## 2022-06-16 DIAGNOSIS — I110 Hypertensive heart disease with heart failure: Secondary | ICD-10-CM | POA: Diagnosis not present

## 2022-06-16 DIAGNOSIS — I482 Chronic atrial fibrillation, unspecified: Secondary | ICD-10-CM | POA: Diagnosis not present

## 2022-06-16 DIAGNOSIS — I509 Heart failure, unspecified: Secondary | ICD-10-CM | POA: Diagnosis not present

## 2022-06-17 DIAGNOSIS — Z7901 Long term (current) use of anticoagulants: Secondary | ICD-10-CM | POA: Diagnosis not present

## 2022-06-23 DIAGNOSIS — K219 Gastro-esophageal reflux disease without esophagitis: Secondary | ICD-10-CM | POA: Diagnosis not present

## 2022-06-23 DIAGNOSIS — Z87891 Personal history of nicotine dependence: Secondary | ICD-10-CM

## 2022-06-23 DIAGNOSIS — Z853 Personal history of malignant neoplasm of breast: Secondary | ICD-10-CM

## 2022-06-23 DIAGNOSIS — Z8616 Personal history of COVID-19: Secondary | ICD-10-CM

## 2022-06-23 DIAGNOSIS — I482 Chronic atrial fibrillation, unspecified: Secondary | ICD-10-CM | POA: Diagnosis not present

## 2022-06-23 DIAGNOSIS — K589 Irritable bowel syndrome without diarrhea: Secondary | ICD-10-CM | POA: Diagnosis not present

## 2022-06-23 DIAGNOSIS — I2510 Atherosclerotic heart disease of native coronary artery without angina pectoris: Secondary | ICD-10-CM | POA: Diagnosis not present

## 2022-06-23 DIAGNOSIS — I110 Hypertensive heart disease with heart failure: Secondary | ICD-10-CM | POA: Diagnosis not present

## 2022-06-23 DIAGNOSIS — I495 Sick sinus syndrome: Secondary | ICD-10-CM | POA: Diagnosis not present

## 2022-06-23 DIAGNOSIS — Z8673 Personal history of transient ischemic attack (TIA), and cerebral infarction without residual deficits: Secondary | ICD-10-CM

## 2022-06-23 DIAGNOSIS — Z9181 History of falling: Secondary | ICD-10-CM

## 2022-06-23 DIAGNOSIS — I509 Heart failure, unspecified: Secondary | ICD-10-CM | POA: Diagnosis not present

## 2022-06-23 DIAGNOSIS — E1142 Type 2 diabetes mellitus with diabetic polyneuropathy: Secondary | ICD-10-CM | POA: Diagnosis not present

## 2022-06-23 DIAGNOSIS — Z7901 Long term (current) use of anticoagulants: Secondary | ICD-10-CM

## 2022-06-23 DIAGNOSIS — R251 Tremor, unspecified: Secondary | ICD-10-CM | POA: Diagnosis not present

## 2022-06-23 DIAGNOSIS — R9082 White matter disease, unspecified: Secondary | ICD-10-CM | POA: Diagnosis not present

## 2022-06-23 DIAGNOSIS — Z87311 Personal history of (healed) other pathological fracture: Secondary | ICD-10-CM

## 2022-06-23 DIAGNOSIS — E785 Hyperlipidemia, unspecified: Secondary | ICD-10-CM | POA: Diagnosis not present

## 2022-06-30 ENCOUNTER — Other Ambulatory Visit: Payer: Self-pay | Admitting: Internal Medicine

## 2022-07-02 DIAGNOSIS — R9082 White matter disease, unspecified: Secondary | ICD-10-CM | POA: Diagnosis not present

## 2022-07-02 DIAGNOSIS — Z853 Personal history of malignant neoplasm of breast: Secondary | ICD-10-CM | POA: Diagnosis not present

## 2022-07-02 DIAGNOSIS — K589 Irritable bowel syndrome without diarrhea: Secondary | ICD-10-CM | POA: Diagnosis not present

## 2022-07-02 DIAGNOSIS — Z7901 Long term (current) use of anticoagulants: Secondary | ICD-10-CM | POA: Diagnosis not present

## 2022-07-02 DIAGNOSIS — I495 Sick sinus syndrome: Secondary | ICD-10-CM | POA: Diagnosis not present

## 2022-07-02 DIAGNOSIS — Z87891 Personal history of nicotine dependence: Secondary | ICD-10-CM | POA: Diagnosis not present

## 2022-07-02 DIAGNOSIS — I110 Hypertensive heart disease with heart failure: Secondary | ICD-10-CM | POA: Diagnosis not present

## 2022-07-02 DIAGNOSIS — I482 Chronic atrial fibrillation, unspecified: Secondary | ICD-10-CM | POA: Diagnosis not present

## 2022-07-02 DIAGNOSIS — E1142 Type 2 diabetes mellitus with diabetic polyneuropathy: Secondary | ICD-10-CM | POA: Diagnosis not present

## 2022-07-02 DIAGNOSIS — R251 Tremor, unspecified: Secondary | ICD-10-CM | POA: Diagnosis not present

## 2022-07-02 DIAGNOSIS — Z8673 Personal history of transient ischemic attack (TIA), and cerebral infarction without residual deficits: Secondary | ICD-10-CM | POA: Diagnosis not present

## 2022-07-02 DIAGNOSIS — Z9181 History of falling: Secondary | ICD-10-CM | POA: Diagnosis not present

## 2022-07-02 DIAGNOSIS — Z87311 Personal history of (healed) other pathological fracture: Secondary | ICD-10-CM | POA: Diagnosis not present

## 2022-07-02 DIAGNOSIS — I509 Heart failure, unspecified: Secondary | ICD-10-CM | POA: Diagnosis not present

## 2022-07-02 DIAGNOSIS — I2510 Atherosclerotic heart disease of native coronary artery without angina pectoris: Secondary | ICD-10-CM | POA: Diagnosis not present

## 2022-07-02 DIAGNOSIS — Z8616 Personal history of COVID-19: Secondary | ICD-10-CM | POA: Diagnosis not present

## 2022-07-02 DIAGNOSIS — K219 Gastro-esophageal reflux disease without esophagitis: Secondary | ICD-10-CM | POA: Diagnosis not present

## 2022-07-02 DIAGNOSIS — E785 Hyperlipidemia, unspecified: Secondary | ICD-10-CM | POA: Diagnosis not present

## 2022-07-03 DIAGNOSIS — E1142 Type 2 diabetes mellitus with diabetic polyneuropathy: Secondary | ICD-10-CM | POA: Diagnosis not present

## 2022-07-03 DIAGNOSIS — I482 Chronic atrial fibrillation, unspecified: Secondary | ICD-10-CM | POA: Diagnosis not present

## 2022-07-03 DIAGNOSIS — I110 Hypertensive heart disease with heart failure: Secondary | ICD-10-CM | POA: Diagnosis not present

## 2022-07-03 DIAGNOSIS — I2510 Atherosclerotic heart disease of native coronary artery without angina pectoris: Secondary | ICD-10-CM | POA: Diagnosis not present

## 2022-07-03 DIAGNOSIS — I509 Heart failure, unspecified: Secondary | ICD-10-CM | POA: Diagnosis not present

## 2022-07-06 ENCOUNTER — Other Ambulatory Visit: Payer: Self-pay | Admitting: Internal Medicine

## 2022-07-10 DIAGNOSIS — I2510 Atherosclerotic heart disease of native coronary artery without angina pectoris: Secondary | ICD-10-CM | POA: Diagnosis not present

## 2022-07-10 DIAGNOSIS — I509 Heart failure, unspecified: Secondary | ICD-10-CM | POA: Diagnosis not present

## 2022-07-10 DIAGNOSIS — I110 Hypertensive heart disease with heart failure: Secondary | ICD-10-CM | POA: Diagnosis not present

## 2022-07-10 DIAGNOSIS — I482 Chronic atrial fibrillation, unspecified: Secondary | ICD-10-CM | POA: Diagnosis not present

## 2022-07-10 DIAGNOSIS — E1142 Type 2 diabetes mellitus with diabetic polyneuropathy: Secondary | ICD-10-CM | POA: Diagnosis not present

## 2022-07-14 DIAGNOSIS — I2510 Atherosclerotic heart disease of native coronary artery without angina pectoris: Secondary | ICD-10-CM | POA: Diagnosis not present

## 2022-07-14 DIAGNOSIS — I509 Heart failure, unspecified: Secondary | ICD-10-CM | POA: Diagnosis not present

## 2022-07-14 DIAGNOSIS — E1142 Type 2 diabetes mellitus with diabetic polyneuropathy: Secondary | ICD-10-CM | POA: Diagnosis not present

## 2022-07-14 DIAGNOSIS — I482 Chronic atrial fibrillation, unspecified: Secondary | ICD-10-CM | POA: Diagnosis not present

## 2022-07-14 DIAGNOSIS — I110 Hypertensive heart disease with heart failure: Secondary | ICD-10-CM | POA: Diagnosis not present

## 2022-07-15 DIAGNOSIS — Z7901 Long term (current) use of anticoagulants: Secondary | ICD-10-CM | POA: Diagnosis not present

## 2022-07-21 DIAGNOSIS — E1142 Type 2 diabetes mellitus with diabetic polyneuropathy: Secondary | ICD-10-CM | POA: Diagnosis not present

## 2022-07-21 DIAGNOSIS — I509 Heart failure, unspecified: Secondary | ICD-10-CM | POA: Diagnosis not present

## 2022-07-21 DIAGNOSIS — I2510 Atherosclerotic heart disease of native coronary artery without angina pectoris: Secondary | ICD-10-CM | POA: Diagnosis not present

## 2022-07-21 DIAGNOSIS — I482 Chronic atrial fibrillation, unspecified: Secondary | ICD-10-CM | POA: Diagnosis not present

## 2022-07-21 DIAGNOSIS — I110 Hypertensive heart disease with heart failure: Secondary | ICD-10-CM | POA: Diagnosis not present

## 2022-07-29 DIAGNOSIS — I482 Chronic atrial fibrillation, unspecified: Secondary | ICD-10-CM | POA: Diagnosis not present

## 2022-07-29 DIAGNOSIS — E1142 Type 2 diabetes mellitus with diabetic polyneuropathy: Secondary | ICD-10-CM | POA: Diagnosis not present

## 2022-07-29 DIAGNOSIS — I2510 Atherosclerotic heart disease of native coronary artery without angina pectoris: Secondary | ICD-10-CM | POA: Diagnosis not present

## 2022-07-29 DIAGNOSIS — I509 Heart failure, unspecified: Secondary | ICD-10-CM | POA: Diagnosis not present

## 2022-07-29 DIAGNOSIS — I110 Hypertensive heart disease with heart failure: Secondary | ICD-10-CM | POA: Diagnosis not present

## 2022-08-01 DIAGNOSIS — Z87891 Personal history of nicotine dependence: Secondary | ICD-10-CM | POA: Diagnosis not present

## 2022-08-01 DIAGNOSIS — R251 Tremor, unspecified: Secondary | ICD-10-CM | POA: Diagnosis not present

## 2022-08-01 DIAGNOSIS — I110 Hypertensive heart disease with heart failure: Secondary | ICD-10-CM | POA: Diagnosis not present

## 2022-08-01 DIAGNOSIS — K589 Irritable bowel syndrome without diarrhea: Secondary | ICD-10-CM | POA: Diagnosis not present

## 2022-08-01 DIAGNOSIS — I2510 Atherosclerotic heart disease of native coronary artery without angina pectoris: Secondary | ICD-10-CM | POA: Diagnosis not present

## 2022-08-01 DIAGNOSIS — Z8616 Personal history of COVID-19: Secondary | ICD-10-CM | POA: Diagnosis not present

## 2022-08-01 DIAGNOSIS — Z7901 Long term (current) use of anticoagulants: Secondary | ICD-10-CM | POA: Diagnosis not present

## 2022-08-01 DIAGNOSIS — I482 Chronic atrial fibrillation, unspecified: Secondary | ICD-10-CM | POA: Diagnosis not present

## 2022-08-01 DIAGNOSIS — Z9181 History of falling: Secondary | ICD-10-CM | POA: Diagnosis not present

## 2022-08-01 DIAGNOSIS — E1142 Type 2 diabetes mellitus with diabetic polyneuropathy: Secondary | ICD-10-CM | POA: Diagnosis not present

## 2022-08-01 DIAGNOSIS — R9082 White matter disease, unspecified: Secondary | ICD-10-CM | POA: Diagnosis not present

## 2022-08-01 DIAGNOSIS — Z8673 Personal history of transient ischemic attack (TIA), and cerebral infarction without residual deficits: Secondary | ICD-10-CM | POA: Diagnosis not present

## 2022-08-01 DIAGNOSIS — I509 Heart failure, unspecified: Secondary | ICD-10-CM | POA: Diagnosis not present

## 2022-08-01 DIAGNOSIS — Z87311 Personal history of (healed) other pathological fracture: Secondary | ICD-10-CM | POA: Diagnosis not present

## 2022-08-01 DIAGNOSIS — I495 Sick sinus syndrome: Secondary | ICD-10-CM | POA: Diagnosis not present

## 2022-08-01 DIAGNOSIS — K219 Gastro-esophageal reflux disease without esophagitis: Secondary | ICD-10-CM | POA: Diagnosis not present

## 2022-08-01 DIAGNOSIS — E785 Hyperlipidemia, unspecified: Secondary | ICD-10-CM | POA: Diagnosis not present

## 2022-08-01 DIAGNOSIS — Z853 Personal history of malignant neoplasm of breast: Secondary | ICD-10-CM | POA: Diagnosis not present

## 2022-08-10 NOTE — Progress Notes (Unsigned)
Leah Morrow, Leah Holmes Phone: [phone]  Leah Holmes is a 89 y.o. female who presents today for weakness and myalgia.  Patient has a history of generalized weakness. She reports feeling slightly more weak and tired for the past 7-10 days. She also has a history of low magnesium. She is not taking oral magnesium. She is currently receiving physical therapy services at home once a week. She reports having occasional headaches that are resolved with Tylenol. She also reports some shortness of breath with walking long distances. She does have an inhaler at home that she does not use. She does not eat much. She also reports a runny nose and sneezing recently. Denies cough. Denies fevers. Denies chest pain. Reports bilateral leg pain due to her neuropathy.  Social History   Tobacco Use  Smoking Status Never  Smokeless Tobacco Former   Types: Snuff  Tobacco Comments   occasionally    Current Outpatient Medications on File Prior to Visit  Medication Sig Dispense Refill   acetaminophen (TYLENOL) 325 MG tablet Take 2 tablets (650 mg total) by mouth every 6 (six) hours as needed.     albuterol (PROAIR HFA) 108 (90 Base) MCG/ACT inhaler Inhale 1-2 puffs into the lungs every 4 (four) hours as needed for wheezing or shortness of breath. 1 Inhaler 5   amLODipine (NORVASC) 10 MG tablet Take 1 tablet by mouth once daily 30 tablet 0   atenolol (TENORMIN) 25 MG tablet Take 25 mg by mouth 2 (two) times daily.     Cholecalciferol (VITAMIN D3) 1000 UNITS CAPS Take 1,000 Units by mouth every morning.      docusate sodium (COLACE) 100 MG capsule Take 2 capsules (200 mg total) by mouth 2 (two) times daily. 60 capsule 0   glucose blood (ONE TOUCH ULTRA TEST) test strip USE 1 STRIP TO CHECK GLUCOSE TWICE DAILY  Dx code: E11.9 200 each 3   glucose blood (ONE TOUCH ULTRA TEST) test strip USE ONE STRIP TO CHECK GLUCOSE ONCE  DAILY 100 each 3   glucose blood test strip Use to check blood sugars once daily.  100 each 12   hydrochlorothiazide (HYDRODIURIL) 25 MG tablet Take 12.5 mg by mouth daily.     isosorbide mononitrate (IMDUR) 60 MG 24 hr tablet Take 60 mg by mouth 2 (two) times daily.     Lancets (ONETOUCH DELICA PLUS 123XX123) [place] Place 2 Devices inside cheek daily. DX Code E11.9. 100 each 2   lisinopril (ZESTRIL) 40 MG tablet Take 1 tablet by mouth twice daily 180 tablet 0   meclizine (ANTIVERT) 25 MG tablet Take 1 tablet by mouth three times daily as needed 40 tablet 5   nitroGLYCERIN (NITROSTAT) 0.4 MG SL tablet Place 0.4 mg under the tongue every 5 (five) minutes as needed for chest pain.     ONETOUCH ULTRA test strip USE 1 STRIP TO CHECK GLUCOSE TWICE DAILY 100 each 0   Polyethyl Glycol-Propyl Glycol 0.4-0.3 % SOLN Place 1 drop into both eyes 3 (three) times daily as needed (for dry/irritated eyes.).     psyllium (METAMUCIL) 58.6 % packet Take 1 packet by mouth daily as needed (for regularity).      triamcinolone cream (KENALOG) 0.1 % APPLY 1 APPLICATION TOPICALLY TWICE DAILY 30 g 0   warfarin (COUMADIN) 2 MG tablet Take 2 mg by mouth 2 (two) times a week. Take '2mg'$  at bedtime on Wednesday & Sunday.     warfarin (COUMADIN) 4 MG tablet Take 4 mg by mouth  at bedtime. Does not take Wednesday or Sunday.     No current facility-administered medications on file prior to visit.    ROS see history of present illness  Objective  Physical Exam Vitals:   08/11/22 0815  BP: 120/60  Pulse: (!) 54  Temp: 98 F (36.7 C)  SpO2: 99%    BP Readings from Last 3 Encounters:  08/11/22 120/60  06/10/22 132/68  05/23/22 (!) 143/64   Wt Readings from Last 3 Encounters:  08/11/22 119 lb 12.8 oz (54.3 kg)  06/10/22 117 lb 6.4 oz (53.3 kg)  05/21/22 117 lb 4.6 oz (53.2 kg)    Physical Exam Constitutional:      General: She is not in acute distress.    Appearance: Normal appearance. She is not ill-appearing.  HENT:     Head: Normocephalic.     Right Ear: Tympanic membrane normal.      Left Ear: Tympanic membrane normal.     Nose: Nose normal.     Mouth/Throat:     Mouth: Mucous membranes are moist.     Pharynx: Oropharynx is clear.  Eyes:     Conjunctiva/sclera: Conjunctivae normal.     Pupils: Pupils are equal, round, and reactive to light.  Neck:     Thyroid: No thyromegaly.  Cardiovascular:     Rate and Rhythm: Normal rate and regular rhythm.     Heart sounds: Normal heart sounds.  Pulmonary:     Effort: Pulmonary effort is normal.     Breath sounds: Normal breath sounds.  Abdominal:     General: Abdomen is flat. Bowel sounds are normal.     Palpations: Abdomen is soft. There is no mass.     Tenderness: There is no abdominal tenderness.  Lymphadenopathy:     Cervical: No cervical adenopathy.  Skin:    General: Skin is warm and dry.     Findings: No rash.  Neurological:     General: No focal deficit present.     Mental Status: She is alert and oriented to person, place, and time.     Motor: Weakness (uses walker) and tremor present.  Psychiatric:        Mood and Affect: Mood normal.        Behavior: Behavior normal.    Assessment/Plan: Please see individual problem list.  Generalized weakness Assessment & Plan: Physical exam WNL. Encouraged to continue in home physical therapy. Lab work as outlined. Will contact with results. Advised to use inhalers PRN. Lungs clear on exam. Continue Tylenol PRN for occasional headaches. Advised adequate fluid intake and to make sure she is eating regularly. Discussed smaller more frequent meals instead of larger meals. Return precautions given to patient.   Orders: -     CBC with Differential/Platelet -     Comprehensive metabolic panel  Allergic rhinitis, unspecified seasonality, unspecified trigger Assessment & Plan: Rhinorrhea and sneezing consistent with allergic rhinitis. Encouraged using nasal spray and antihistamine which patient has at home. Advised to seek care if symptoms are worsening or changing.     Hypomagnesemia Assessment & Plan: Repeat level in December normal. Oral supplement discontinued. Will check level today.   Orders: -     Magnesium   Return if symptoms worsen or fail to improve.   Leah Morrow, Leah Holmes [place]

## 2022-08-11 ENCOUNTER — Encounter: Payer: Self-pay | Admitting: Nurse Practitioner

## 2022-08-11 ENCOUNTER — Ambulatory Visit (INDEPENDENT_AMBULATORY_CARE_PROVIDER_SITE_OTHER): Payer: Medicare Other | Admitting: Nurse Practitioner

## 2022-08-11 VITALS — BP 120/60 | HR 54 | Temp 98.0°F | Ht 66.0 in | Wt 119.8 lb

## 2022-08-11 DIAGNOSIS — R531 Weakness: Secondary | ICD-10-CM

## 2022-08-11 DIAGNOSIS — J309 Allergic rhinitis, unspecified: Secondary | ICD-10-CM | POA: Diagnosis not present

## 2022-08-11 LAB — COMPREHENSIVE METABOLIC PANEL
ALT: 15 U/L (ref 0–35)
AST: 18 U/L (ref 0–37)
Albumin: 3.6 g/dL (ref 3.5–5.2)
Alkaline Phosphatase: 59 U/L (ref 39–117)
BUN: 11 mg/dL (ref 6–23)
CO2: 32 mEq/L (ref 19–32)
Calcium: 9.3 mg/dL (ref 8.4–10.5)
Chloride: 100 mEq/L (ref 96–112)
Creatinine, Ser: 0.55 mg/dL (ref 0.40–1.20)
GFR: 78.73 mL/min (ref >60.00)
Glucose, Bld: 152 mg/dL — ABNORMAL HIGH (ref 70–99)
Potassium: 3.8 mEq/L (ref 3.5–5.1)
Sodium: 138 mEq/L (ref 135–145)
Total Bilirubin: 0.8 mg/dL (ref 0.2–1.2)
Total Protein: 6.1 g/dL (ref 6.0–8.3)

## 2022-08-11 LAB — CBC WITH DIFFERENTIAL/PLATELET
Basophils Absolute: 0 10*3/uL (ref 0.0–0.1)
Basophils Relative: 0.6 % (ref 0.0–3.0)
Eosinophils Absolute: 0.1 10*3/uL (ref 0.0–0.7)
Eosinophils Relative: 2.5 % (ref 0.0–5.0)
HCT: 40.4 % (ref 36.0–46.0)
Hemoglobin: 13.7 g/dL (ref 12.0–15.0)
Lymphocytes Relative: 17 % (ref 12.0–46.0)
Lymphs Abs: 0.7 10*3/uL (ref 0.7–4.0)
MCHC: 34 g/dL (ref 30.0–36.0)
MCV: 96 fl (ref 78.0–100.0)
Monocytes Absolute: 0.4 10*3/uL (ref 0.1–1.0)
Monocytes Relative: 8.9 % (ref 3.0–12.0)
Neutro Abs: 2.9 10*3/uL (ref 1.4–7.7)
Neutrophils Relative %: 71 % (ref 43.0–77.0)
Platelets: 212 10*3/uL (ref 150.0–400.0)
RBC: 4.2 Mil/uL (ref 3.87–5.11)
RDW: 13.5 % (ref 11.5–15.5)
WBC: 4.1 10*3/uL (ref 4.0–10.5)

## 2022-08-11 LAB — MAGNESIUM: Magnesium: 1.7 mg/dL (ref 1.5–2.5)

## 2022-08-11 NOTE — Assessment & Plan Note (Addendum)
Physical exam WNL. Encouraged to continue in home physical therapy. Lab work as outlined. Will contact with results. Advised to use inhalers PRN. Lungs clear on exam. Continue Tylenol PRN for occasional headaches. Advised adequate fluid intake and to make sure she is eating regularly. Discussed smaller more frequent meals instead of larger meals. Return precautions given to patient.

## 2022-08-11 NOTE — Assessment & Plan Note (Signed)
Rhinorrhea and sneezing consistent with allergic rhinitis. Encouraged using nasal spray and antihistamine which patient has at home. Advised to seek care if symptoms are worsening or changing.

## 2022-08-11 NOTE — Assessment & Plan Note (Addendum)
Repeat level in December normal. Oral supplement discontinued. Will check level today.

## 2022-08-17 DIAGNOSIS — Z7901 Long term (current) use of anticoagulants: Secondary | ICD-10-CM | POA: Diagnosis not present

## 2022-08-25 DIAGNOSIS — I110 Hypertensive heart disease with heart failure: Secondary | ICD-10-CM | POA: Diagnosis not present

## 2022-08-25 DIAGNOSIS — I2510 Atherosclerotic heart disease of native coronary artery without angina pectoris: Secondary | ICD-10-CM | POA: Diagnosis not present

## 2022-08-25 DIAGNOSIS — I509 Heart failure, unspecified: Secondary | ICD-10-CM | POA: Diagnosis not present

## 2022-08-25 DIAGNOSIS — E1142 Type 2 diabetes mellitus with diabetic polyneuropathy: Secondary | ICD-10-CM | POA: Diagnosis not present

## 2022-08-25 DIAGNOSIS — I482 Chronic atrial fibrillation, unspecified: Secondary | ICD-10-CM | POA: Diagnosis not present

## 2022-08-27 DIAGNOSIS — I10 Essential (primary) hypertension: Secondary | ICD-10-CM | POA: Diagnosis not present

## 2022-08-27 DIAGNOSIS — I517 Cardiomegaly: Secondary | ICD-10-CM | POA: Diagnosis not present

## 2022-08-27 DIAGNOSIS — I2510 Atherosclerotic heart disease of native coronary artery without angina pectoris: Secondary | ICD-10-CM | POA: Diagnosis not present

## 2022-08-27 DIAGNOSIS — I700 Atherosclerosis of aorta: Secondary | ICD-10-CM | POA: Diagnosis not present

## 2022-08-27 DIAGNOSIS — I340 Nonrheumatic mitral (valve) insufficiency: Secondary | ICD-10-CM | POA: Diagnosis not present

## 2022-08-27 DIAGNOSIS — I272 Pulmonary hypertension, unspecified: Secondary | ICD-10-CM | POA: Diagnosis not present

## 2022-08-27 DIAGNOSIS — I350 Nonrheumatic aortic (valve) stenosis: Secondary | ICD-10-CM | POA: Diagnosis not present

## 2022-08-27 DIAGNOSIS — I495 Sick sinus syndrome: Secondary | ICD-10-CM | POA: Diagnosis not present

## 2022-08-27 DIAGNOSIS — E782 Mixed hyperlipidemia: Secondary | ICD-10-CM | POA: Diagnosis not present

## 2022-08-27 DIAGNOSIS — I482 Chronic atrial fibrillation, unspecified: Secondary | ICD-10-CM | POA: Diagnosis not present

## 2022-08-27 DIAGNOSIS — I071 Rheumatic tricuspid insufficiency: Secondary | ICD-10-CM | POA: Diagnosis not present

## 2022-09-07 ENCOUNTER — Ambulatory Visit (INDEPENDENT_AMBULATORY_CARE_PROVIDER_SITE_OTHER): Payer: Medicare Other | Admitting: Internal Medicine

## 2022-09-07 ENCOUNTER — Encounter: Payer: Self-pay | Admitting: Internal Medicine

## 2022-09-07 VITALS — BP 150/64 | HR 70 | Temp 97.3°F | Ht 66.0 in | Wt 119.6 lb

## 2022-09-07 DIAGNOSIS — E1169 Type 2 diabetes mellitus with other specified complication: Secondary | ICD-10-CM

## 2022-09-07 DIAGNOSIS — G2581 Restless legs syndrome: Secondary | ICD-10-CM

## 2022-09-07 DIAGNOSIS — Z7189 Other specified counseling: Secondary | ICD-10-CM | POA: Diagnosis not present

## 2022-09-07 DIAGNOSIS — E1142 Type 2 diabetes mellitus with diabetic polyneuropathy: Secondary | ICD-10-CM | POA: Diagnosis not present

## 2022-09-07 DIAGNOSIS — M8080XS Other osteoporosis with current pathological fracture, unspecified site, sequela: Secondary | ICD-10-CM | POA: Diagnosis not present

## 2022-09-07 DIAGNOSIS — I10 Essential (primary) hypertension: Secondary | ICD-10-CM

## 2022-09-07 DIAGNOSIS — E611 Iron deficiency: Secondary | ICD-10-CM | POA: Diagnosis not present

## 2022-09-07 DIAGNOSIS — E785 Hyperlipidemia, unspecified: Secondary | ICD-10-CM | POA: Diagnosis not present

## 2022-09-07 MED ORDER — LISINOPRIL 40 MG PO TABS: 40.0000 mg | ORAL_TABLET | Freq: Two times a day (BID) | ORAL | 0 refills | Status: AC

## 2022-09-07 MED ORDER — AMLODIPINE BESYLATE 10 MG PO TABS: 10.0000 mg | ORAL_TABLET | Freq: Every day | ORAL | 0 refills | Status: AC

## 2022-09-07 NOTE — Progress Notes (Unsigned)
Patient ID: Leah Holmes, female    DOB: 1926/11/30  Age: 89 y.o. MRN: [id number]  The patient is here for follow up  and management of other chronic and acute problems.   The risk factors are reflected in the social history.  The roster of all physicians providing medical care to patient - is listed in the Snapshot section of the chart.  Activities of daily living:  The patient is 100% independent in all ADLs: dressing, toileting, feeding as well as independent mobility  Home safety : The patient has smoke detectors in the home. They wear seatbelts.  There are no firearms at home. There is no violence in the home.   There is no risks for hepatitis, STDs or HIV. There is no   history of blood transfusion. They have no travel history to infectious disease endemic areas of the world.  The patient has seen their dentist in the last six month. She has  seen their eye doctor in the last year. She admits to slight hearing difficulty with regard to whispered voices and some television programs.  They have deferred audiologic testing in the last year.  They do not  have excessive sun exposure. Discussed the need for sun protection: hats, long sleeves and use of sunscreen if there is significant sun exposure.   Diet: the importance of a healthy diet is discussed. They do have a healthy diet.  The benefits of regular aerobic exercise were discussed. She walks 4 times per week ,  20 minutes.   Depression screen: there are no signs or vegative symptoms of depression- irritability, change in appetite, anhedonia, sadness/tearfullness.  Cognitive assessment: the patient manages all their financial and personal affairs and is actively engaged. They could relate day,date,year and events; recalled 2/3 objects at 3 minutes; performed clock-face test normally.  The following portions of the patient's history were reviewed and updated as appropriate: allergies, current medications, past family history,  past medical history,  past surgical history, past social history  and problem list.  Visual acuity was not assessed per patient preference since she has regular follow up with her ophthalmologist. Hearing and body mass index were assessed and reviewed.   During the course of the visit the patient was educated and counseled about appropriate screening and preventive services including : fall prevention , diabetes screening, nutrition counseling, colorectal cancer screening, and recommended immunizations.    CC: There were no encounter diagnoses.  History Leah Holmes has a past medical history of Atrial fibrillation ([place]) (2012), Breast cancer ([place]) (04/2018), CHF (congestive heart failure) ([place]), Colon polyp (2003), Compression fracture of L2 lumbar vertebra ([place]), Coronary artery disease, COVID-19 virus infection (12/25/2019), Diabetes mellitus without complication ([place]), Dyspnea, Dysrhythmia, Environmental and seasonal allergies, GERD (gastroesophageal reflux disease), Hyperlipidemia, Hypertension, Hypertensive urgency (03/10/2021), Irritable bowel syndrome, Multiple rib fractures (03/15/2018), Personal history of radiation therapy, Polyp of colon, PONV (postoperative nausea and vomiting), Presence of permanent cardiac pacemaker, Tremor, Tremors of nervous system, and Ulcer.   She has a past surgical history that includes Appendectomy; Abdominal hysterectomy; Colonoscopy (2003); Upper gi endoscopy (2003); fibroid tumor; Abdominal adhesion surgery; Breast surgery (Right); Cesarean section; Pacemaker insertion (N/A, 01/28/2016); Cholecystectomy (03/2012); Sentinel node biopsy (Left, 04/22/2018); Breast biopsy (Right, late 80s/early 90s); Breast biopsy (Left, 03/31/2018); Breast lumpectomy (Left, 04/22/2018); Breast lumpectomy (Left, 04/22/2018); and Breast lumpectomy (Left, 2020).   Her family history includes Breast cancer in her cousin and sister; Breast cancer (age of onset: 41) in an other family member;  Breast cancer (  age of onset: 22) in her maternal aunt; Cancer (age of onset: 19) in her sister.She reports that she has never smoked. She has quit using smokeless tobacco.  Her smokeless tobacco use included snuff. She reports that she does not drink alcohol and does not use drugs.  Outpatient Medications Prior to Visit  Medication Sig Dispense Refill   acetaminophen (TYLENOL) 325 MG tablet Take 2 tablets (650 mg total) by mouth every 6 (six) hours as needed.     albuterol (PROAIR HFA) 108 (90 Base) MCG/ACT inhaler Inhale 1-2 puffs into the lungs every 4 (four) hours as needed for wheezing or shortness of breath. 1 Inhaler 5   amLODipine (NORVASC) 10 MG tablet Take 1 tablet by mouth once daily 30 tablet 0   atenolol (TENORMIN) 25 MG tablet Take 25 mg by mouth 2 (two) times daily.     Cholecalciferol (VITAMIN D3) 1000 UNITS CAPS Take 1,000 Units by mouth every morning.      docusate sodium (COLACE) 100 MG capsule Take 2 capsules (200 mg total) by mouth 2 (two) times daily. 60 capsule 0   glucose blood (ONE TOUCH ULTRA TEST) test strip USE 1 STRIP TO CHECK GLUCOSE TWICE DAILY  Dx code: E11.9 200 each 3   glucose blood (ONE TOUCH ULTRA TEST) test strip USE ONE STRIP TO CHECK GLUCOSE ONCE  DAILY 100 each 3   glucose blood test strip Use to check blood sugars once daily. 100 each 12   hydrochlorothiazide (HYDRODIURIL) 25 MG tablet Take 12.5 mg by mouth daily.     isosorbide mononitrate (IMDUR) 60 MG 24 hr tablet Take 60 mg by mouth 2 (two) times daily.     Lancets (ONETOUCH DELICA PLUS 123XX123) [place] [address] 2 Devices inside cheek daily. DX Code E11.9. 100 each 2   lisinopril (ZESTRIL) 40 MG tablet Take 1 tablet by mouth twice daily 180 tablet 0   meclizine (ANTIVERT) 25 MG tablet Take 1 tablet by mouth three times daily as needed 40 tablet 5   nitroGLYCERIN (NITROSTAT) 0.4 MG SL tablet Place 0.4 mg under the tongue every 5 (five) minutes as needed for chest pain.     ONETOUCH ULTRA test strip USE 1  STRIP TO CHECK GLUCOSE TWICE DAILY 100 each 0   Polyethyl Glycol-Propyl Glycol 0.4-0.3 % SOLN Place 1 drop into both eyes 3 (three) times daily as needed (for dry/irritated eyes.).     psyllium (METAMUCIL) 58.6 % packet Take 1 packet by mouth daily as needed (for regularity).      triamcinolone cream (KENALOG) 0.1 % APPLY 1 APPLICATION TOPICALLY TWICE DAILY 30 g 0   warfarin (COUMADIN) 2 MG tablet Take 2 mg by mouth 2 (two) times a week. Take 2mg  at bedtime on Wednesday & Sunday.     warfarin (COUMADIN) 4 MG tablet Take 4 mg by mouth at bedtime. Does not take Wednesday or Sunday.     No facility-administered medications prior to visit.    Review of Systems  Objective:  BP (!) 150/64   Pulse 70   Temp (!) 97.3 F (36.3 C) (Oral)   Ht 5\' 6"  (1.676 m)   Wt 119 lb 9.6 oz (54.3 kg)   SpO2 95%   BMI 19.30 kg/m   Physical Exam  Assessment & Plan:  There are no diagnoses linked to this encounter.    I provided 40 minutes of  face-to-face time during this encounter reviewing patient's current problems and past surgeries,  recent labs and imaging studies,  providing counseling on the above mentioned problems , and coordination  of care .   Follow-up: No follow-ups on file.   Crecencio Mc, MD

## 2022-09-07 NOTE — Assessment & Plan Note (Signed)
Reviewed patient's  Do Not Resuscitate Orders which were made last year.  Patient continues to desire DNR status and has the order prominently displaced in the home.  

## 2022-09-07 NOTE — Patient Instructions (Signed)
You are doing great!   Try using aspercreme on your legs   Checking iron ,  A1c and liver /kidney function today

## 2022-09-08 LAB — IRON,TIBC AND FERRITIN PANEL
%SAT: 24 % (calc) (ref 16–45)
Ferritin: 70 ng/mL (ref 16–288)
Iron: 72 ug/dL (ref 45–160)
TIBC: 302 mcg/dL (calc) (ref 250–450)

## 2022-09-08 LAB — COMPREHENSIVE METABOLIC PANEL
ALT: 19 U/L (ref 0–35)
AST: 23 U/L (ref 0–37)
Albumin: 4.2 g/dL (ref 3.5–5.2)
Alkaline Phosphatase: 64 U/L (ref 39–117)
BUN: 12 mg/dL (ref 6–23)
CO2: 32 mEq/L (ref 19–32)
Calcium: 9.8 mg/dL (ref 8.4–10.5)
Chloride: 99 mEq/L (ref 96–112)
Creatinine, Ser: 0.58 mg/dL (ref 0.40–1.20)
GFR: 77.68 mL/min (ref >60.00)
Glucose, Bld: 133 mg/dL — ABNORMAL HIGH (ref 70–99)
Potassium: 4.1 mEq/L (ref 3.5–5.1)
Sodium: 137 mEq/L (ref 135–145)
Total Bilirubin: 0.6 mg/dL (ref 0.2–1.2)
Total Protein: 6.8 g/dL (ref 6.0–8.3)

## 2022-09-08 LAB — HEMOGLOBIN A1C: Hgb A1c MFr Bld: 6.9 % — ABNORMAL HIGH (ref 4.6–6.5)

## 2022-09-08 NOTE — Assessment & Plan Note (Signed)
Currently well-controlled on current medications . No changes to regimen.    Lab Results  Component Value Date   HGBA1C 6.9 (H) 09/07/2022   Lab Results  Component Value Date   MICROALBUR 1.0 04/08/2022   MICROALBUR 1.0 02/23/2020

## 2022-09-08 NOTE — Assessment & Plan Note (Addendum)
Well controlled on  4 drug regimen of lisinopri, atenolol, amlodipine and hctz . Renal function stable, no changes today. \\ Lab Results  Component Value Date   CREATININE 0.58 09/07/2022   Lab Results  Component Value Date   NA 137 09/07/2022   K 4.1 09/07/2022   CL 99 09/07/2022   CO2 32 09/07/2022

## 2022-09-08 NOTE — Assessment & Plan Note (Signed)
Historically Untreated due to patient preference ; now with L2 fracture.  Continue miacalcin nasal spray   

## 2022-09-15 DIAGNOSIS — Z7901 Long term (current) use of anticoagulants: Secondary | ICD-10-CM | POA: Diagnosis not present

## 2022-09-21 DIAGNOSIS — H2589 Other age-related cataract: Secondary | ICD-10-CM | POA: Diagnosis not present

## 2022-09-21 DIAGNOSIS — H26492 Other secondary cataract, left eye: Secondary | ICD-10-CM | POA: Diagnosis not present

## 2022-09-21 DIAGNOSIS — H40031 Anatomical narrow angle, right eye: Secondary | ICD-10-CM | POA: Diagnosis not present

## 2022-09-21 DIAGNOSIS — E119 Type 2 diabetes mellitus without complications: Secondary | ICD-10-CM | POA: Diagnosis not present

## 2022-10-07 DIAGNOSIS — I517 Cardiomegaly: Secondary | ICD-10-CM | POA: Diagnosis not present

## 2022-10-07 DIAGNOSIS — I482 Chronic atrial fibrillation, unspecified: Secondary | ICD-10-CM | POA: Diagnosis not present

## 2022-10-07 DIAGNOSIS — I272 Pulmonary hypertension, unspecified: Secondary | ICD-10-CM | POA: Diagnosis not present

## 2022-10-13 DIAGNOSIS — Z7901 Long term (current) use of anticoagulants: Secondary | ICD-10-CM | POA: Diagnosis not present

## 2022-10-22 DIAGNOSIS — H0289 Other specified disorders of eyelid: Secondary | ICD-10-CM | POA: Diagnosis not present

## 2022-10-22 DIAGNOSIS — H04123 Dry eye syndrome of bilateral lacrimal glands: Secondary | ICD-10-CM | POA: Diagnosis not present

## 2022-10-22 DIAGNOSIS — H01009 Unspecified blepharitis unspecified eye, unspecified eyelid: Secondary | ICD-10-CM | POA: Diagnosis not present

## 2022-10-23 ENCOUNTER — Encounter (HOSPITAL_COMMUNITY): Payer: Self-pay

## 2022-10-23 ENCOUNTER — Other Ambulatory Visit: Payer: Self-pay

## 2022-10-23 ENCOUNTER — Encounter: Payer: Self-pay | Admitting: Nurse Practitioner

## 2022-10-23 ENCOUNTER — Inpatient Hospital Stay (HOSPITAL_COMMUNITY)
Admission: EM | Admit: 2022-10-23 | Discharge: 2022-11-02 | DRG: 981 | Disposition: A | Discharge status: Skilled Nursing Facility | Payer: Medicare Other | Attending: Internal Medicine | Admitting: Internal Medicine

## 2022-10-23 ENCOUNTER — Ambulatory Visit (INDEPENDENT_AMBULATORY_CARE_PROVIDER_SITE_OTHER): Payer: Medicare Other | Admitting: Nurse Practitioner

## 2022-10-23 ENCOUNTER — Emergency Department
Admission: EM | Admit: 2022-10-23 | Discharge: 2022-10-23 | Disposition: A | Discharge status: Other Acute Inpatient Hospital | Payer: Medicare Other | Attending: Emergency Medicine | Admitting: Emergency Medicine

## 2022-10-23 ENCOUNTER — Emergency Department: Payer: Medicare Other

## 2022-10-23 VITALS — BP 176/84 | HR 66 | Temp 98.0°F | Ht 66.0 in | Wt 120.0 lb

## 2022-10-23 DIAGNOSIS — K6389 Other specified diseases of intestine: Secondary | ICD-10-CM | POA: Diagnosis not present

## 2022-10-23 DIAGNOSIS — I2510 Atherosclerotic heart disease of native coronary artery without angina pectoris: Secondary | ICD-10-CM | POA: Diagnosis present

## 2022-10-23 DIAGNOSIS — I5032 Chronic diastolic (congestive) heart failure: Secondary | ICD-10-CM | POA: Diagnosis present

## 2022-10-23 DIAGNOSIS — H538 Other visual disturbances: Secondary | ICD-10-CM | POA: Insufficient documentation

## 2022-10-23 DIAGNOSIS — I700 Atherosclerosis of aorta: Secondary | ICD-10-CM | POA: Diagnosis present

## 2022-10-23 DIAGNOSIS — R140 Abdominal distension (gaseous): Secondary | ICD-10-CM | POA: Diagnosis not present

## 2022-10-23 DIAGNOSIS — F29 Unspecified psychosis not due to a substance or known physiological condition: Secondary | ICD-10-CM | POA: Diagnosis not present

## 2022-10-23 DIAGNOSIS — R29818 Other symptoms and signs involving the nervous system: Secondary | ICD-10-CM | POA: Diagnosis not present

## 2022-10-23 DIAGNOSIS — E1149 Type 2 diabetes mellitus with other diabetic neurological complication: Secondary | ICD-10-CM | POA: Diagnosis not present

## 2022-10-23 DIAGNOSIS — E119 Type 2 diabetes mellitus without complications: Secondary | ICD-10-CM | POA: Insufficient documentation

## 2022-10-23 DIAGNOSIS — F339 Major depressive disorder, recurrent, unspecified: Secondary | ICD-10-CM | POA: Diagnosis present

## 2022-10-23 DIAGNOSIS — G4489 Other headache syndrome: Principal | ICD-10-CM | POA: Diagnosis present

## 2022-10-23 DIAGNOSIS — G8929 Other chronic pain: Secondary | ICD-10-CM | POA: Diagnosis not present

## 2022-10-23 DIAGNOSIS — R9431 Abnormal electrocardiogram [ECG] [EKG]: Secondary | ICD-10-CM | POA: Diagnosis not present

## 2022-10-23 DIAGNOSIS — I110 Hypertensive heart disease with heart failure: Secondary | ICD-10-CM | POA: Diagnosis present

## 2022-10-23 DIAGNOSIS — R41841 Cognitive communication deficit: Secondary | ICD-10-CM | POA: Diagnosis not present

## 2022-10-23 DIAGNOSIS — E8809 Other disorders of plasma-protein metabolism, not elsewhere classified: Secondary | ICD-10-CM | POA: Diagnosis present

## 2022-10-23 DIAGNOSIS — J90 Pleural effusion, not elsewhere classified: Secondary | ICD-10-CM | POA: Diagnosis not present

## 2022-10-23 DIAGNOSIS — I480 Paroxysmal atrial fibrillation: Secondary | ICD-10-CM | POA: Diagnosis not present

## 2022-10-23 DIAGNOSIS — R5381 Other malaise: Secondary | ICD-10-CM | POA: Diagnosis not present

## 2022-10-23 DIAGNOSIS — E43 Unspecified severe protein-calorie malnutrition: Secondary | ICD-10-CM | POA: Insufficient documentation

## 2022-10-23 DIAGNOSIS — Z8616 Personal history of COVID-19: Secondary | ICD-10-CM | POA: Diagnosis not present

## 2022-10-23 DIAGNOSIS — Z743 Need for continuous supervision: Secondary | ICD-10-CM | POA: Diagnosis not present

## 2022-10-23 DIAGNOSIS — R519 Headache, unspecified: Secondary | ICD-10-CM | POA: Diagnosis not present

## 2022-10-23 DIAGNOSIS — I4891 Unspecified atrial fibrillation: Secondary | ICD-10-CM | POA: Diagnosis not present

## 2022-10-23 DIAGNOSIS — W010XXA Fall on same level from slipping, tripping and stumbling without subsequent striking against object, initial encounter: Secondary | ICD-10-CM | POA: Diagnosis not present

## 2022-10-23 DIAGNOSIS — R4182 Altered mental status, unspecified: Secondary | ICD-10-CM | POA: Diagnosis not present

## 2022-10-23 DIAGNOSIS — R441 Visual hallucinations: Secondary | ICD-10-CM

## 2022-10-23 DIAGNOSIS — I340 Nonrheumatic mitral (valve) insufficiency: Secondary | ICD-10-CM | POA: Diagnosis not present

## 2022-10-23 DIAGNOSIS — S72009A Fracture of unspecified part of neck of unspecified femur, initial encounter for closed fracture: Secondary | ICD-10-CM | POA: Diagnosis present

## 2022-10-23 DIAGNOSIS — I350 Nonrheumatic aortic (valve) stenosis: Secondary | ICD-10-CM

## 2022-10-23 DIAGNOSIS — I6602 Occlusion and stenosis of left middle cerebral artery: Secondary | ICD-10-CM | POA: Diagnosis not present

## 2022-10-23 DIAGNOSIS — Z923 Personal history of irradiation: Secondary | ICD-10-CM

## 2022-10-23 DIAGNOSIS — E1169 Type 2 diabetes mellitus with other specified complication: Secondary | ICD-10-CM | POA: Diagnosis present

## 2022-10-23 DIAGNOSIS — R1311 Dysphagia, oral phase: Secondary | ICD-10-CM | POA: Diagnosis not present

## 2022-10-23 DIAGNOSIS — Z803 Family history of malignant neoplasm of breast: Secondary | ICD-10-CM

## 2022-10-23 DIAGNOSIS — K567 Ileus, unspecified: Secondary | ICD-10-CM | POA: Diagnosis not present

## 2022-10-23 DIAGNOSIS — Z7901 Long term (current) use of anticoagulants: Secondary | ICD-10-CM

## 2022-10-23 DIAGNOSIS — R443 Hallucinations, unspecified: Secondary | ICD-10-CM | POA: Insufficient documentation

## 2022-10-23 DIAGNOSIS — E876 Hypokalemia: Secondary | ICD-10-CM | POA: Diagnosis not present

## 2022-10-23 DIAGNOSIS — I421 Obstructive hypertrophic cardiomyopathy: Secondary | ICD-10-CM | POA: Diagnosis not present

## 2022-10-23 DIAGNOSIS — Z888 Allergy status to other drugs, medicaments and biological substances status: Secondary | ICD-10-CM

## 2022-10-23 DIAGNOSIS — K219 Gastro-esophageal reflux disease without esophagitis: Secondary | ICD-10-CM | POA: Diagnosis present

## 2022-10-23 DIAGNOSIS — K56609 Unspecified intestinal obstruction, unspecified as to partial versus complete obstruction: Secondary | ICD-10-CM | POA: Diagnosis not present

## 2022-10-23 DIAGNOSIS — F411 Generalized anxiety disorder: Secondary | ICD-10-CM | POA: Diagnosis present

## 2022-10-23 DIAGNOSIS — J811 Chronic pulmonary edema: Secondary | ICD-10-CM | POA: Diagnosis not present

## 2022-10-23 DIAGNOSIS — I509 Heart failure, unspecified: Secondary | ICD-10-CM | POA: Diagnosis not present

## 2022-10-23 DIAGNOSIS — S72001A Fracture of unspecified part of neck of right femur, initial encounter for closed fracture: Secondary | ICD-10-CM | POA: Diagnosis not present

## 2022-10-23 DIAGNOSIS — K9189 Other postprocedural complications and disorders of digestive system: Secondary | ICD-10-CM | POA: Diagnosis not present

## 2022-10-23 DIAGNOSIS — Z681 Body mass index (BMI) 19 or less, adult: Secondary | ICD-10-CM | POA: Diagnosis not present

## 2022-10-23 DIAGNOSIS — R17 Unspecified jaundice: Secondary | ICD-10-CM | POA: Diagnosis not present

## 2022-10-23 DIAGNOSIS — Z950 Presence of cardiac pacemaker: Secondary | ICD-10-CM | POA: Diagnosis not present

## 2022-10-23 DIAGNOSIS — E1136 Type 2 diabetes mellitus with diabetic cataract: Secondary | ICD-10-CM | POA: Diagnosis present

## 2022-10-23 DIAGNOSIS — H539 Unspecified visual disturbance: Secondary | ICD-10-CM

## 2022-10-23 DIAGNOSIS — E785 Hyperlipidemia, unspecified: Secondary | ICD-10-CM | POA: Diagnosis not present

## 2022-10-23 DIAGNOSIS — I495 Sick sinus syndrome: Secondary | ICD-10-CM | POA: Diagnosis present

## 2022-10-23 DIAGNOSIS — Z66 Do not resuscitate: Secondary | ICD-10-CM | POA: Diagnosis not present

## 2022-10-23 DIAGNOSIS — E1142 Type 2 diabetes mellitus with diabetic polyneuropathy: Secondary | ICD-10-CM | POA: Diagnosis present

## 2022-10-23 DIAGNOSIS — I083 Combined rheumatic disorders of mitral, aortic and tricuspid valves: Secondary | ICD-10-CM | POA: Diagnosis present

## 2022-10-23 DIAGNOSIS — G43809 Other migraine, not intractable, without status migrainosus: Secondary | ICD-10-CM | POA: Diagnosis not present

## 2022-10-23 DIAGNOSIS — R2689 Other abnormalities of gait and mobility: Secondary | ICD-10-CM | POA: Diagnosis not present

## 2022-10-23 DIAGNOSIS — M4856XA Collapsed vertebra, not elsewhere classified, lumbar region, initial encounter for fracture: Secondary | ICD-10-CM | POA: Diagnosis present

## 2022-10-23 DIAGNOSIS — R1310 Dysphagia, unspecified: Secondary | ICD-10-CM | POA: Diagnosis not present

## 2022-10-23 DIAGNOSIS — I6381 Other cerebral infarction due to occlusion or stenosis of small artery: Secondary | ICD-10-CM | POA: Diagnosis not present

## 2022-10-23 DIAGNOSIS — Z87891 Personal history of nicotine dependence: Secondary | ICD-10-CM

## 2022-10-23 DIAGNOSIS — Z885 Allergy status to narcotic agent status: Secondary | ICD-10-CM

## 2022-10-23 DIAGNOSIS — Z79899 Other long term (current) drug therapy: Secondary | ICD-10-CM

## 2022-10-23 DIAGNOSIS — R262 Difficulty in walking, not elsewhere classified: Secondary | ICD-10-CM | POA: Diagnosis not present

## 2022-10-23 DIAGNOSIS — S72141A Displaced intertrochanteric fracture of right femur, initial encounter for closed fracture: Secondary | ICD-10-CM | POA: Diagnosis not present

## 2022-10-23 DIAGNOSIS — E7800 Pure hypercholesterolemia, unspecified: Secondary | ICD-10-CM | POA: Diagnosis present

## 2022-10-23 DIAGNOSIS — Z853 Personal history of malignant neoplasm of breast: Secondary | ICD-10-CM

## 2022-10-23 DIAGNOSIS — I361 Nonrheumatic tricuspid (valve) insufficiency: Secondary | ICD-10-CM | POA: Diagnosis not present

## 2022-10-23 DIAGNOSIS — S72141D Displaced intertrochanteric fracture of right femur, subsequent encounter for closed fracture with routine healing: Secondary | ICD-10-CM | POA: Diagnosis not present

## 2022-10-23 DIAGNOSIS — S7291XA Unspecified fracture of right femur, initial encounter for closed fracture: Secondary | ICD-10-CM | POA: Diagnosis not present

## 2022-10-23 DIAGNOSIS — Z4789 Encounter for other orthopedic aftercare: Secondary | ICD-10-CM | POA: Diagnosis not present

## 2022-10-23 DIAGNOSIS — K148 Other diseases of tongue: Secondary | ICD-10-CM | POA: Diagnosis present

## 2022-10-23 DIAGNOSIS — I482 Chronic atrial fibrillation, unspecified: Secondary | ICD-10-CM | POA: Diagnosis present

## 2022-10-23 DIAGNOSIS — Z9181 History of falling: Secondary | ICD-10-CM | POA: Diagnosis not present

## 2022-10-23 DIAGNOSIS — I10 Essential (primary) hypertension: Secondary | ICD-10-CM | POA: Diagnosis present

## 2022-10-23 DIAGNOSIS — W19XXXD Unspecified fall, subsequent encounter: Secondary | ICD-10-CM | POA: Diagnosis not present

## 2022-10-23 DIAGNOSIS — I6523 Occlusion and stenosis of bilateral carotid arteries: Secondary | ICD-10-CM | POA: Diagnosis not present

## 2022-10-23 DIAGNOSIS — Y92239 Unspecified place in hospital as the place of occurrence of the external cause: Secondary | ICD-10-CM | POA: Diagnosis present

## 2022-10-23 DIAGNOSIS — Z880 Allergy status to penicillin: Secondary | ICD-10-CM

## 2022-10-23 DIAGNOSIS — M6281 Muscle weakness (generalized): Secondary | ICD-10-CM | POA: Diagnosis not present

## 2022-10-23 DIAGNOSIS — S7291XD Unspecified fracture of right femur, subsequent encounter for closed fracture with routine healing: Secondary | ICD-10-CM | POA: Diagnosis not present

## 2022-10-23 DIAGNOSIS — R2681 Unsteadiness on feet: Secondary | ICD-10-CM | POA: Diagnosis not present

## 2022-10-23 LAB — CBC WITH DIFFERENTIAL/PLATELET
Abs Immature Granulocytes: 0.01 10*3/uL (ref 0.00–0.07)
Basophils Absolute: 0 10*3/uL (ref 0.0–0.1)
Basophils Relative: 1 %
Eosinophils Absolute: 0.1 10*3/uL (ref 0.0–0.5)
Eosinophils Relative: 2 %
HCT: 41.4 % (ref 36.0–46.0)
Hemoglobin: 13.8 g/dL (ref 12.0–15.0)
Immature Granulocytes: 0 %
Lymphocytes Relative: 22 %
Lymphs Abs: 1.1 10*3/uL (ref 0.7–4.0)
MCH: 31.7 pg (ref 26.0–34.0)
MCHC: 33.3 g/dL (ref 30.0–36.0)
MCV: 95 fL (ref 80.0–100.0)
Monocytes Absolute: 0.5 10*3/uL (ref 0.1–1.0)
Monocytes Relative: 9 %
Neutro Abs: 3.3 10*3/uL (ref 1.7–7.7)
Neutrophils Relative %: 66 %
Platelets: 223 10*3/uL (ref 150–400)
RBC: 4.36 MIL/uL (ref 3.87–5.11)
RDW: 12.8 % (ref 11.5–15.5)
WBC: 5 10*3/uL (ref 4.0–10.5)
nRBC: 0 % (ref 0.0–0.2)

## 2022-10-23 LAB — COMPREHENSIVE METABOLIC PANEL
ALT: 18 U/L (ref 0–44)
AST: 25 U/L (ref 15–41)
Albumin: 4 g/dL (ref 3.5–5.0)
Alkaline Phosphatase: 57 U/L (ref 38–126)
Anion gap: 9 (ref 5–15)
BUN: 13 mg/dL (ref 8–23)
CO2: 27 mmol/L (ref 22–32)
Calcium: 9.2 mg/dL (ref 8.9–10.3)
Chloride: 100 mmol/L (ref 98–111)
Creatinine, Ser: 0.55 mg/dL (ref 0.44–1.00)
GFR, Estimated: >60 mL/min (ref >60)
Glucose, Bld: 161 mg/dL — ABNORMAL HIGH (ref 70–99)
Potassium: 3.7 mmol/L (ref 3.5–5.1)
Sodium: 136 mmol/L (ref 135–145)
Total Bilirubin: 0.8 mg/dL (ref 0.3–1.2)
Total Protein: 6.9 g/dL (ref 6.5–8.1)

## 2022-10-23 LAB — PROTIME-INR
INR: 2 — ABNORMAL HIGH (ref 0.8–1.2)
Prothrombin Time: 22.6 seconds — ABNORMAL HIGH (ref 11.4–15.2)

## 2022-10-23 LAB — APTT: aPTT: 47 seconds — ABNORMAL HIGH (ref 24–36)

## 2022-10-23 MED ORDER — PROCHLORPERAZINE EDISYLATE 10 MG/2ML IJ SOLN: 10.0000 mg | Freq: Once | INTRAMUSCULAR | Status: DC

## 2022-10-23 MED ORDER — SODIUM CHLORIDE 0.9 % IV BOLUS
500.0000 mL | Freq: Once | INTRAVENOUS | Status: AC
  Administered 2022-10-23: 500 mL via INTRAVENOUS

## 2022-10-23 MED ORDER — DIPHENHYDRAMINE HCL 50 MG/ML IJ SOLN: 12.5000 mg | Freq: Once | INTRAMUSCULAR | Status: DC

## 2022-10-23 MED ORDER — KETOROLAC TROMETHAMINE 15 MG/ML IJ SOLN: 15.0000 mg | Freq: Once | INTRAMUSCULAR | Status: DC

## 2022-10-23 MED ORDER — PROCHLORPERAZINE EDISYLATE 10 MG/2ML IJ SOLN
10.0000 mg | Freq: Once | INTRAMUSCULAR | Status: AC
  Administered 2022-10-23: 10 mg via INTRAVENOUS
  Filled 2022-10-23: qty 2

## 2022-10-23 MED ORDER — LABETALOL HCL 5 MG/ML IV SOLN
0.5000 mg/min | Status: DC
  Administered 2022-10-23: 0.5 mg/min via INTRAVENOUS
  Filled 2022-10-23 (×2): qty 80

## 2022-10-23 MED ORDER — LABETALOL HCL 5 MG/ML IV SOLN
10.0000 mg | Freq: Once | INTRAVENOUS | Status: AC
  Administered 2022-10-23: 10 mg via INTRAVENOUS
  Filled 2022-10-23: qty 4

## 2022-10-23 MED ORDER — ACETAMINOPHEN 325 MG PO TABS
650.0000 mg | ORAL_TABLET | Freq: Once | ORAL | Status: DC
  Filled 2022-10-23: qty 2

## 2022-10-23 MED ORDER — DIPHENHYDRAMINE HCL 50 MG/ML IJ SOLN
12.5000 mg | Freq: Once | INTRAMUSCULAR | Status: AC
  Administered 2022-10-23: 12.5 mg via INTRAVENOUS
  Filled 2022-10-23: qty 1

## 2022-10-23 MED ORDER — ACETAMINOPHEN 500 MG PO TABS: 1000.0000 mg | ORAL_TABLET | Freq: Once | ORAL | Status: DC

## 2022-10-23 NOTE — Progress Notes (Signed)
Established Patient Office Visit  Subjective:  Patient ID: Leah Holmes, female    DOB: 1927/02/03  Age: 89 y.o. MRN: [id number]  CC:  Chief Complaint  Patient presents with   Headache   Hallucinations    HPI  Leah Holmes presents for headache, hallucinations and left eye hurting. The Hallucination started yesterday and it is visual. Denies any fall. She  had been to the ophthalmologist and had normal eye exam. She do have cataract of right eyes.   Headache  This is a new problem. The current episode started yesterday (The headache woke her up this morning at 4:00). The problem occurs intermittently. The problem has been gradually worsening. The pain is located in the Bilateral region. The pain does not radiate. The pain quality is not similar to prior headaches (worst headche). The quality of the pain is described as pulsating and sharp. The pain is at a severity of 8/10 (at present 5/10 but in the morning it was 8/10). The pain is moderate. Associated symptoms include eye pain. Pertinent negatives include no blurred vision, ear pain, nausea, numbness, sore throat or visual change. She has tried acetaminophen for the symptoms.     Past Medical History:  Diagnosis Date   Atrial fibrillation (HCC) 2012   Breast cancer (HCC) 04/2018   IDC of left breast    CHF (congestive heart failure) (HCC)    Colon polyp 2003   Compression fracture of L2 lumbar vertebra (HCC)    Coronary artery disease    COVID-19 virus infection 12/25/2019   Diabetes mellitus without complication (HCC)    Dyspnea    Dysrhythmia    atrial fib   Environmental and seasonal allergies    GERD (gastroesophageal reflux disease)    Hyperlipidemia    Hypertension    Hypertensive urgency 03/10/2021   Irritable bowel syndrome    Multiple rib fractures 03/15/2018   Personal history of radiation therapy    Polyp of colon    PONV (postoperative nausea and vomiting)    with ether, not recently  "woke up in surgery once"   Presence of permanent cardiac pacemaker    Tremor    Tremors of nervous system    Ulcer     Past Surgical History:  Procedure Laterality Date   ABDOMINAL ADHESION SURGERY     ABDOMINAL HYSTERECTOMY     APPENDECTOMY     BREAST BIOPSY Right late 80s/early 90s   benign   BREAST BIOPSY Left 03/31/2018   [place] bx done with Dr. Lemar Livings, invasive ductal carcinoma   BREAST LUMPECTOMY Left 04/22/2018   Procedure: BREAST LUMPECTOMY;  Surgeon: Earline Mayotte, MD;  Location: [facility];  Service: General;  Laterality: Left;   BREAST LUMPECTOMY Left 04/22/2018   [facility], negative LN, clear margins   BREAST LUMPECTOMY Left 2020   positive, done in office   BREAST SURGERY Right    biopsy    CESAREAN SECTION     x 2   CHOLECYSTECTOMY  03/2012   Dr Excell Seltzer   COLONOSCOPY  2003   Dr Fritzi Mandes in [place]   fibroid tumor     PACEMAKER INSERTION N/A 01/28/2016   Procedure: INSERTION PACEMAKER;  Surgeon: Marcina Millard, MD;  Location: [facility];  Service: Cardiovascular;  Laterality: N/A;   SENTINEL NODE BIOPSY Left 04/22/2018   Procedure: SENTINEL NODE BIOPSY;  Surgeon: Earline Mayotte, MD;  Location: [facility];  Service: General;  Laterality: Left;   UPPER GI ENDOSCOPY  2003  Dr Fritzi Mandes in [place]    Family History  Problem Relation Age of Onset   Breast cancer Sister    Cancer Sister 36       breast   Breast cancer Maternal Aunt 53   Breast cancer Cousin    Breast cancer Other 43    Social History   Socioeconomic History   Marital status: Widowed    Spouse name: Not on file   Number of children: Not on file   Years of education: Not on file   Highest education level: 6th grade  Occupational History   Not on file  Tobacco Use   Smoking status: Never   Smokeless tobacco: Former    Types: Snuff   Tobacco comments:    occasionally  Vaping Use   Vaping Use: Never used  Substance and Sexual Activity   Alcohol use: No   Drug use: No   Sexual  activity: Never  Other Topics Concern   Not on file  Social History Narrative   Not on file   Social Determinants of Health   Financial Resource Strain: Low Risk  (09/03/2022)   Overall Financial Resource Strain (CARDIA)    Difficulty of Paying Living Expenses: Not hard at all  Food Insecurity: No Food Insecurity (09/03/2022)   Hunger Vital Sign    Worried About Running Out of Food in the Last Year: Never true    Ran Out of Food in the Last Year: Never true  Transportation Needs: No Transportation Needs (09/03/2022)   PRAPARE - Administrator, Civil Service (Medical): No    Lack of Transportation (Non-Medical): No  Physical Activity: Unknown (09/03/2022)   Exercise Vital Sign    Days of Exercise per Week: 0 days    Minutes of Exercise per Session: Not on file  Stress: No Stress Concern Present (09/03/2022)   [employer] of Occupational Health - Occupational Stress Questionnaire    Feeling of Stress : Only a little  Social Connections: Unknown (09/03/2022)   Social Connection and Isolation Panel [NHANES]    Frequency of Communication with Friends and Family: Patient declined    Frequency of Social Gatherings with Friends and Family: Patient declined    Attends Religious Services: Patient declined    Database administrator or Organizations: No    Attends Engineer, structural: Not on file    Marital Status: Widowed  Intimate Partner Violence: Not At Risk (05/22/2022)   Humiliation, Afraid, Rape, and Kick questionnaire    Fear of Current or Ex-Partner: No    Emotionally Abused: No    Physically Abused: No    Sexually Abused: No     Outpatient Medications Prior to Visit  Medication Sig Dispense Refill   acetaminophen (TYLENOL) 325 MG tablet Take 2 tablets (650 mg total) by mouth every 6 (six) hours as needed.     albuterol (PROAIR HFA) 108 (90 Base) MCG/ACT inhaler Inhale 1-2 puffs into the lungs every 4 (four) hours as needed for wheezing or shortness of  breath. 1 Inhaler 5   amLODipine (NORVASC) 10 MG tablet Take 1 tablet (10 mg total) by mouth daily. 30 tablet 0   atenolol (TENORMIN) 25 MG tablet Take 25 mg by mouth 2 (two) times daily.     Cholecalciferol (VITAMIN D3) 1000 UNITS CAPS Take 1,000 Units by mouth every morning.      docusate sodium (COLACE) 100 MG capsule Take 2 capsules (200 mg total) by mouth 2 (two) times daily.  60 capsule 0   glucose blood (ONE TOUCH ULTRA TEST) test strip USE 1 STRIP TO CHECK GLUCOSE TWICE DAILY  Dx code: E11.9 200 each 3   glucose blood (ONE TOUCH ULTRA TEST) test strip USE ONE STRIP TO CHECK GLUCOSE ONCE  DAILY 100 each 3   glucose blood test strip Use to check blood sugars once daily. 100 each 12   hydrochlorothiazide (HYDRODIURIL) 25 MG tablet Take 12.5 mg by mouth daily.     isosorbide mononitrate (IMDUR) 60 MG 24 hr tablet Take 60 mg by mouth 2 (two) times daily.     Lancets (ONETOUCH DELICA PLUS LANCET33G) MISC Place 2 Devices inside cheek daily. DX Code E11.9. 100 each 2   lisinopril (ZESTRIL) 40 MG tablet Take 1 tablet (40 mg total) by mouth 2 (two) times daily. 180 tablet 0   meclizine (ANTIVERT) 25 MG tablet Take 1 tablet by mouth three times daily as needed 40 tablet 5   neomycin-polymyxin b-dexamethasone (MAXITROL) 3.5-10000-0.1 SUSP Place 1 drop into both eyes daily.     nitroGLYCERIN (NITROSTAT) 0.4 MG SL tablet Place 0.4 mg under the tongue every 5 (five) minutes as needed for chest pain.     ONETOUCH ULTRA test strip USE 1 STRIP TO CHECK GLUCOSE TWICE DAILY 100 each 0   Polyethyl Glycol-Propyl Glycol 0.4-0.3 % SOLN Place 1 drop into both eyes 3 (three) times daily as needed (for dry/irritated eyes.).     psyllium (METAMUCIL) 58.6 % packet Take 1 packet by mouth daily as needed (for regularity).      triamcinolone cream (KENALOG) 0.1 % APPLY 1 APPLICATION TOPICALLY TWICE DAILY 30 g 0   warfarin (COUMADIN) 2 MG tablet Take 2 mg by mouth 2 (two) times a week. Take 2mg  at bedtime on Wednesday &  Sunday.     warfarin (COUMADIN) 4 MG tablet Take 4 mg by mouth at bedtime. Does not take Wednesday or Sunday.     No facility-administered medications prior to visit.    Allergies  Allergen Reactions   Codeine Anaphylaxis   Penicillins Anaphylaxis    Has patient had a PCN reaction causing immediate rash, facial/tongue/throat swelling, SOB or lightheadedness with hypotension: Yes Has patient had a PCN reaction causing severe rash involving mucus membranes or skin necrosis: No Has patient had a PCN reaction that required hospitalization Yes Has patient had a PCN reaction occurring within the last 10 years: No If all of the above answers are "NO", then may proceed with Cephalosporin use.   Atorvastatin     Other reaction(s): Unknown   Clonidine Derivatives Other (See Comments)    Reaction:  Unknown    Gabapentin Nausea Only   Losartan Other (See Comments)    Other reaction(s): Other (See Comments) Reaction:  Muscle aches  Reaction:  Muscle aches    Morphine     Other reaction(s): Unknown   Morphine And Related Other (See Comments)    Reaction:  Unknown    Reglan [Metoclopramide] Other (See Comments)    Reaction:  Tremors    Valtrex [Valacyclovir] Other (See Comments)    Tremors    Amlodipine Other (See Comments)    Diffuse itching    Hydralazine Anxiety   Spironolactone Itching    ROS Review of Systems  Constitutional: Negative.   HENT:  Negative for ear pain and sore throat.   Eyes:  Positive for pain. Negative for blurred vision.  Gastrointestinal:  Negative for nausea.  Musculoskeletal: Negative.   Neurological:  Positive for  tremors and headaches. Negative for numbness.  Psychiatric/Behavioral: Negative.        Objective:    Physical Exam Constitutional:      Appearance: She is well-developed and normal weight.  HENT:     Head: Normocephalic.     Mouth/Throat:     Mouth: Mucous membranes are moist.  Cardiovascular:     Heart sounds: Normal heart sounds. No  murmur heard. Pulmonary:     Effort: Pulmonary effort is normal.     Breath sounds: No stridor. No wheezing.  Musculoskeletal:     Cervical back: Normal range of motion.  Skin:    General: Skin is warm.  Neurological:     Mental Status: She is alert.     Gait: Gait abnormal (walks with walker).  Psychiatric:        Mood and Affect: Mood normal.        Speech: Speech normal.        Behavior: Behavior normal.     BP (!) 176/84   Pulse 66   Temp 98 F (36.7 C) (Oral)   Ht 5\' 6"  (1.676 m)   Wt 120 lb (54.4 kg)   SpO2 97%   BMI 19.37 kg/m  Wt Readings from Last 3 Encounters:  10/23/22 120 lb (54.4 kg)  09/07/22 119 lb 9.6 oz (54.3 kg)  08/11/22 119 lb 12.8 oz (54.3 kg)     Health Maintenance  Topic Date Due   COVID-19 Vaccine (1) Never done   Zoster Vaccines- Shingrix (1 of 2) Never done   DTaP/Tdap/Td (2 - Td or Tdap) 04/07/2022   FOOT EXAM  09/02/2022   Medicare Annual Wellness (AWV)  01/07/2023 (Originally 11/07/2020)   OPHTHALMOLOGY EXAM  10/25/2022   INFLUENZA VACCINE  01/14/2023   HEMOGLOBIN A1C  03/10/2023   Pneumonia Vaccine 53+ Years old  Completed   DEXA SCAN  Completed   HPV VACCINES  Aged Out    There are no preventive care reminders to display for this patient.  Lab Results  Component Value Date   TSH 0.752 05/21/2022   Lab Results  Component Value Date   WBC 4.1 08/11/2022   HGB 13.7 08/11/2022   HCT 40.4 08/11/2022   MCV 96.0 08/11/2022   PLT 212.0 08/11/2022   Lab Results  Component Value Date   NA 137 09/07/2022   K 4.1 09/07/2022   CO2 32 09/07/2022   GLUCOSE 133 (H) 09/07/2022   BUN 12 09/07/2022   CREATININE 0.58 09/07/2022   BILITOT 0.6 09/07/2022   ALKPHOS 64 09/07/2022   AST 23 09/07/2022   ALT 19 09/07/2022   PROT 6.8 09/07/2022   ALBUMIN 4.2 09/07/2022   CALCIUM 9.8 09/07/2022   ANIONGAP 5 05/23/2022   GFR 77.68 09/07/2022   Lab Results  Component Value Date   CHOL 174 09/13/2020   Lab Results  Component Value  Date   HDL 60.10 09/13/2020   Lab Results  Component Value Date   LDLCALC 101 (H) 09/13/2020   Lab Results  Component Value Date   TRIG 63.0 09/13/2020   Lab Results  Component Value Date   CHOLHDL 3 09/13/2020   Lab Results  Component Value Date   HGBA1C 6.9 (H) 09/07/2022      Assessment & Plan:  Acute nonintractable headache, unspecified headache type Assessment & Plan: Would require neuro imaging  and workup for infection in the setting of hallucination and worst headache of her life.  Advised patient to go to the  ER for further evaluation.    Hallucination Assessment & Plan: New symptoms, no history of depression or anxiety. Need further evaluation.       Follow-up: No follow-ups on file.   Kara Dies, NP

## 2022-10-23 NOTE — ED Notes (Addendum)
Pt alert, NAD, calm, interactive, resps e/u, speaking in clear complete sentences. Endorses HA, and visual changes L>R, cataracts removed from L. Vague description. Sees changes, even with eyes closed. Seen by ophthalmologist this week. Denies NV, sob, dizziness, or CP. Family at [facility]. Rates HA 4/10, improved. BP elevated. BP meds last taken this am. Lisinopril, HCTZ, Imdur and atenolol due shortly. Warfarin due at 2000.

## 2022-10-23 NOTE — ED Provider Notes (Signed)
[place] EMERGENCY DEPARTMENT AT [facility] Provider Note  CSN: [id number] Arrival date & time: 10/23/22 2241  Chief Complaint(s) Headache  HPI Leah Holmes is a 89 y.o. female presents to the emergency department as a transfer from [facility] emergency department to obtain an MRI to rule out CVA versus press.  Patient presented there after being referred by her PCP for having 3 days of headache and hallucinations.  Patient was noted to be hypotensive in the emergency department.  She was given migraine cocktail which completely resolved her headache.  Currently she denies any physical complaints.  There was no focal deficits on their exam.  Dr. Selina Cooley from neurology was consulted who recommended transfer to [facility] for the MRI.  Patient has a history of A-fib on Coumadin status post MRI compatible pacemaker.   Headache   Past Medical History Past Medical History:  Diagnosis Date   Atrial fibrillation (HCC) 2012   Breast cancer (HCC) 04/2018   IDC of left breast    CHF (congestive heart failure) (HCC)    Colon polyp 2003   Compression fracture of L2 lumbar vertebra (HCC)    Coronary artery disease    COVID-19 virus infection 12/25/2019   Diabetes mellitus without complication (HCC)    Dyspnea    Dysrhythmia    atrial fib   Environmental and seasonal allergies    GERD (gastroesophageal reflux disease)    Hyperlipidemia    Hypertension    Hypertensive urgency 03/10/2021   Irritable bowel syndrome    Multiple rib fractures 03/15/2018   Personal history of radiation therapy    Polyp of colon    PONV (postoperative nausea and vomiting)    with ether, not recently "woke up in surgery once"   Presence of permanent cardiac pacemaker    Tremor    Tremors of nervous system    Ulcer    Patient Active Problem List   Diagnosis Date Noted   Acute nonintractable headache 10/23/2022   Hallucination 10/23/2022   Allergic rhinitis 08/11/2022   Hypomagnesemia  06/11/2022   Hospital discharge follow-up 06/11/2022   Generalized weakness 05/21/2022   Rib pain on right side 04/08/2022   Left foot pain 04/08/2022   Counseling regarding end of life decision making 11/01/2021   Glucosuria 09/01/2021   Vitamin D deficiency 05/27/2021   History of COVID-19 04/24/2021   QT prolongation    Back pain 03/10/2021   Myalgia due to statin 09/15/2020   Acquired thrombophilia (HCC) 12/25/2019   S/P placement of cardiac pacemaker 09/08/2019   Invasive ductal carcinoma of breast, female, left (HCC) 04/26/2018   Bilateral carotid artery stenosis 04/20/2018   Aortic atherosclerosis (HCC) 03/15/2018   Balance problem 03/15/2018   History of fall 03/15/2018   Intention tremor 04/15/2017   Osteoporosis 03/21/2016   Sick sinus syndrome (HCC) 01/28/2016   Other malaise 11/01/2015   Major depressive disorder, recurrent episode (HCC) 10/30/2015   Constipation 07/27/2015   Generalized anxiety disorder 04/02/2015   Chronic atrial fibrillation (HCC) 08/16/2014   Medicare annual wellness visit, subsequent 05/23/2014   Moderate mitral insufficiency 05/08/2014   Moderate tricuspid insufficiency 05/08/2014   Dry mouth 03/10/2014   Low serum vitamin D 02/16/2014   LVH (left ventricular hypertrophy) 11/23/2013   Anal polyp 09/20/2013   Personal history of colonic polyps 08/31/2013   CAD (coronary artery disease) 03/09/2013   DM type 2 with diabetic peripheral neuropathy (HCC) 03/09/2013   Hyperlipidemia associated with type 2 diabetes mellitus (HCC) 03/09/2013  Essential hypertension, benign 03/09/2013   Nonulcer dyspepsia 03/09/2013   Home Medication(s) Prior to Admission medications   Medication Sig Start Date End Date Taking? Authorizing Provider  acetaminophen (TYLENOL) 325 MG tablet Take 2 tablets (650 mg total) by mouth every 6 (six) hours as needed. 03/14/21   Alford Highland, MD  amLODipine (NORVASC) 10 MG tablet Take 1 tablet (10 mg total) by mouth  daily. Patient taking differently: Take 10 mg by mouth daily as needed (high blood pressure). 09/07/22   Sherlene Shams, MD  atenolol (TENORMIN) 25 MG tablet Take 25 mg by mouth 2 (two) times daily. 07/01/21   [provider]  Cholecalciferol (VITAMIN D3) 1000 UNITS CAPS Take 1,000 Units by mouth every morning.     [provider]  docusate sodium (COLACE) 100 MG capsule Take 2 capsules (200 mg total) by mouth 2 (two) times daily. 03/14/21   Wieting, Richard, MD  glucose blood (ONE TOUCH ULTRA TEST) test strip USE 1 STRIP TO CHECK GLUCOSE TWICE DAILY  Dx code: E11.9 05/29/19   Sherlene Shams, MD  glucose blood (ONE TOUCH ULTRA TEST) test strip USE ONE STRIP TO CHECK GLUCOSE ONCE  DAILY 06/02/19   Sherlene Shams, MD  glucose blood test strip Use to check blood sugars once daily. 09/01/21   Sherlene Shams, MD  hydrochlorothiazide (HYDRODIURIL) 25 MG tablet Take 25 mg by mouth every other day.    [provider]  isosorbide mononitrate (IMDUR) 60 MG 24 hr tablet Take 60 mg by mouth 2 (two) times daily.    [provider]  Lancets [facility] DELICA PLUS [place]) MISC Place 2 Devices inside cheek daily. DX Code E11.9. 09/01/21   Sherlene Shams, MD  lisinopril (ZESTRIL) 40 MG tablet Take 1 tablet (40 mg total) by mouth 2 (two) times daily. 09/07/22   Sherlene Shams, MD  meclizine (ANTIVERT) 25 MG tablet Take 1 tablet by mouth three times daily as needed 07/01/22   Sherlene Shams, MD  neomycin-polymyxin b-dexamethasone (MAXITROL) 3.5-10000-0.1 SUSP Place 1 drop into both eyes 2 (two) times daily. 10/22/22 11/01/22  [provider]  nitroGLYCERIN (NITROSTAT) 0.4 MG SL tablet Place 0.4 mg under the tongue every 5 (five) minutes as needed for chest pain.    [provider]  [facility] ULTRA test strip USE 1 STRIP TO CHECK GLUCOSE TWICE DAILY 05/15/19   Sherlene Shams, MD  Polyethyl Glycol-Propyl Glycol 0.4-0.3 % SOLN Place 1 drop into both eyes 3 (three) times  daily as needed (for dry/irritated eyes.).    [provider]  psyllium (METAMUCIL) 58.6 % packet Take 1 packet by mouth daily as needed (for regularity).     [provider]  warfarin (COUMADIN) 2 MG tablet Take 2 mg by mouth See admin instructions. Take 1 tablet (2mg ) by mouth every Sunday and Wednesday night    [provider]  warfarin (COUMADIN) 4 MG tablet Take 4 mg by mouth See admin instructions. Take 2 tablets (4mg ) by mouth every Monday, Tuesday, Thursday, Friday and Saturday night    [provider]  Allergies Codeine, Penicillins, Atorvastatin, Clonidine derivatives, Gabapentin, Losartan, Morphine, Morphine and related, Reglan [metoclopramide], Valtrex [valacyclovir], Amlodipine, Hydralazine, and Spironolactone  Review of Systems Review of Systems  Neurological:  Positive for headaches.   As noted in HPI  Physical Exam Vital Signs  I have reviewed the triage vital signs BP (!) 225/84   Pulse 66   Resp 19   Ht 5\' 6"  (1.676 m)   Wt 54.4 kg   SpO2 100%   BMI 19.36 kg/m   Physical Exam Vitals reviewed.  Constitutional:      General: She is not in acute distress.    Appearance: She is well-developed. She is not diaphoretic.  HENT:     Head: Normocephalic and atraumatic.     Right Ear: External ear normal.     Left Ear: External ear normal.     Nose: Nose normal.  Eyes:     General: No scleral icterus.    Conjunctiva/sclera: Conjunctivae normal.  Neck:     Trachea: Phonation normal.  Cardiovascular:     Rate and Rhythm: Normal rate and regular rhythm.  Pulmonary:     Effort: Pulmonary effort is normal. No respiratory distress.     Breath sounds: No stridor.  Abdominal:     General: There is no distension.  Musculoskeletal:        General: Normal range of motion.     Cervical back: Normal range of  motion.  Neurological:     Mental Status: She is alert and oriented to person, place, and time.     Comments: Mental Status:  Alert and oriented to person, place, and time.  Attention and concentration normal.  Speech clear.  Recent memory is intact  Cranial Nerves:  II Visual Fields: Intact to confrontation. Visual fields intact. III, IV, VI: Pupils equal and reactive to light and near. Full eye movement without nystagmus  V Facial Sensation: Normal. No weakness of masticatory muscles  VII: No facial weakness or asymmetry  VIII Auditory Acuity: hard of hearing IX/X: The uvula is midline; the palate elevates symmetrically  XI: Normal sternocleidomastoid and trapezius strength  XII: The tongue is midline. No atrophy or fasciculations.   Motor System: Muscle Strength: 5/5 and symmetric in the upper and lower extremities. No pronation or drift.  Muscle Tone: Tone and muscle bulk are normal in the upper and lower extremities.  Reflexes: DTRs: No Clonus Coordination: Intact finger-to-nose. Essential tremor bilateral  Sensation: Intact to light touch, and pinprick. Negative Romberg test.  Gait: deferred   Psychiatric:        Behavior: Behavior normal.     ED Results and Treatments Labs (all labs ordered are listed, but only abnormal results are displayed) Labs Reviewed - No data to display                                                                                                                       EKG  EKG Interpretation  Date/Time:    Ventricular  Rate:    PR Interval:    QRS Duration:   QT Interval:    QTC Calculation:   R Axis:     Text Interpretation:         Radiology CT Head Wo Contrast  Result Date: 10/23/2022 CLINICAL DATA:  Provided history: Headache, increasing frequency or severity. EXAM: CT HEAD WITHOUT CONTRAST TECHNIQUE: Contiguous axial images were obtained from the base of the skull through the vertex without intravenous contrast. RADIATION DOSE  REDUCTION: This exam was performed according to the departmental dose-optimization program which includes automated exposure control, adjustment of the mA and/or kV according to patient size and/or use of iterative reconstruction technique. COMPARISON:  Head CT 05/21/2022. FINDINGS: Brain: Generalized parenchymal atrophy. Suspected 4 mm partially calcified meningioma overlying the left parietal lobe, unchanged from the prior head CT 05/21/2022. Redemonstrated chronic lacunar infarct within the right basal ganglia/internal capsule. Background mild patchy and ill-defined hypoattenuation within the cerebral white matter, nonspecific but compatible with chronic small vessel ischemic disease. There is no acute intracranial hemorrhage. No demarcated cortical infarct. No extra-axial fluid collection. No midline shift. Vascular: No hyperdense vessel.  Atherosclerotic calcifications. Skull: No fracture or aggressive osseous lesion. Sinuses/Orbits: No mass or acute finding within the imaged orbits. No significant paranasal sinus disease at the imaged levels. IMPRESSION: 1. No evidence of acute intracranial hemorrhage or acute infarct. 2. Suspected 4 mm partially calcified meningioma overlying the left parietal lobe, unchanged from the prior head CT of 05/21/2022. 3. Redemonstrated chronic lacunar infarct within the right basal ganglia/internal capsule. 4. Background mild cerebral white matter chronic small vessel ischemic disease. 5. Generalized parenchymal atrophy. Electronically Signed   By: Jackey Loge D.O.   On: 10/23/2022 14:45    Medications Ordered in ED Medications  labetalol (NORMODYNE) infusion 5 mg/mL (has no administration in time range)                                                                                                                                     Procedures Procedures  (including critical care time)  Medical Decision Making / ED Course  Click here for ABCD2, HEART and other  calculators  Medical Decision Making Amount and/or Complexity of Data Reviewed Radiology: ordered.    This patient presents to the ED for concerns noted below, this involves an extensive number of treatment options, and is a complaint that carries with it a high risk of complications and morbidity. The differential diagnosis includes but not limited to:  Headache Resolved after migraine cocktail at [facility]. Likely migranous give her PMH.  Hallucinations Sent here to rule out CVA vs PRESS.  Noted to be HTN with sbp 220  Initial intervention:  Labetalol gtt  Work up Interpretation and Management:  Cardiac Monitoring/EKG: ***  Laboratory Tests ordered listed below with my independent interpretation: ***   Imaging Studies ordered listed below with my independent interpretation: ***    Reassessment: ***  Final Clinical Impression(s) / ED Diagnoses Final diagnoses:  None    {Document critical care time when appropriate:1}  {Document review of labs and clinical decision tools ie heart score, Chads2Vasc2 etc:1}  {Document your independent review of radiology images, and any outside records:1} {Document your discussion with family members, caretakers, and with consultants:1} {Document social determinants of health affecting pt's care:1} {Document your decision making why or why not admission, treatments were needed:1} This chart was dictated using voice recognition software.  Despite best efforts to proofread,  errors can occur which can change the documentation meaning.

## 2022-10-23 NOTE — ED Triage Notes (Signed)
Pt brought in by family sent from Doctors office. Family report pt having anterior headache for the past 3 days history of HTN and yesterday started to hallucinate light and figures that aren't there. Was at the eye doctor yesterday. Prescribed steroid eye drops. FAST neg.

## 2022-10-23 NOTE — ED Triage Notes (Signed)
Pt sent from [place] for an MRI. Pt has been having headaches and started "seeing things" for a couple days. Pt is AOx4 and daughter at bedside.

## 2022-10-23 NOTE — ED Provider Notes (Incomplete)
[place] EMERGENCY DEPARTMENT AT [facility] Provider Note  CSN: [id number] Arrival date & time: 10/23/22 2241  Chief Complaint(s) Headache  HPI King Tier is a 89 y.o. female presents to the emergency department as a transfer from [facility] emergency department to obtain an MRI to rule out CVA versus press.  Patient presented there after being referred by her PCP for having 3 days of headache and hallucinations.  Patient was noted to be hypertensive in the emergency department.  She was given migraine cocktail which completely resolved her headache.  Currently she denies any physical complaints.  There was no focal deficits on their exam.  Dr. Selina Cooley from neurology was consulted who recommended transfer to [facility] for the MRI.  Daughter states that the patient has been mentioning seeing various things over the past several weeks, which they thought were related to her glassess, but after seeing an eye doctor they realized it was not.  Patient has a history of A-fib on Coumadin status post MRI compatible pacemaker.  The history is provided by the patient and a relative.    Past Medical History Past Medical History:  Diagnosis Date  . Atrial fibrillation (HCC) 2012  . Breast cancer (HCC) 04/2018   IDC of left breast   . CHF (congestive heart failure) (HCC)   . Colon polyp 2003  . Compression fracture of L2 lumbar vertebra (HCC)   . Coronary artery disease   . COVID-19 virus infection 12/25/2019  . Diabetes mellitus without complication (HCC)   . Dyspnea   . Dysrhythmia    atrial fib  . Environmental and seasonal allergies   . GERD (gastroesophageal reflux disease)   . Hyperlipidemia   . Hypertension   . Hypertensive urgency 03/10/2021  . Irritable bowel syndrome   . Multiple rib fractures 03/15/2018  . Personal history of radiation therapy   . Polyp of colon   . PONV (postoperative nausea and vomiting)    with ether, not recently "woke up in surgery  once"  . Presence of permanent cardiac pacemaker   . Tremor   . Tremors of nervous system   . Ulcer    Patient Active Problem List   Diagnosis Date Noted  . Acute nonintractable headache 10/23/2022  . Hallucination 10/23/2022  . Allergic rhinitis 08/11/2022  . Hypomagnesemia 06/11/2022  . Hospital discharge follow-up 06/11/2022  . Generalized weakness 05/21/2022  . Rib pain on right side 04/08/2022  . Left foot pain 04/08/2022  . Counseling regarding end of life decision making 11/01/2021  . Glucosuria 09/01/2021  . Vitamin D deficiency 05/27/2021  . History of COVID-19 04/24/2021  . QT prolongation   . Back pain 03/10/2021  . Myalgia due to statin 09/15/2020  . Acquired thrombophilia (HCC) 12/25/2019  . S/P placement of cardiac pacemaker 09/08/2019  . Invasive ductal carcinoma of breast, female, left (HCC) 04/26/2018  . Bilateral carotid artery stenosis 04/20/2018  . Aortic atherosclerosis (HCC) 03/15/2018  . Balance problem 03/15/2018  . History of fall 03/15/2018  . Intention tremor 04/15/2017  . Osteoporosis 03/21/2016  . Sick sinus syndrome (HCC) 01/28/2016  . Other malaise 11/01/2015  . Major depressive disorder, recurrent episode (HCC) 10/30/2015  . Constipation 07/27/2015  . Generalized anxiety disorder 04/02/2015  . Chronic atrial fibrillation (HCC) 08/16/2014  . Medicare annual wellness visit, subsequent 05/23/2014  . Moderate mitral insufficiency 05/08/2014  . Moderate tricuspid insufficiency 05/08/2014  . Dry mouth 03/10/2014  . Low serum vitamin D 02/16/2014  . LVH (left  ventricular hypertrophy) 11/23/2013  . Anal polyp 09/20/2013  . Personal history of colonic polyps 08/31/2013  . CAD (coronary artery disease) 03/09/2013  . DM type 2 with diabetic peripheral neuropathy (HCC) 03/09/2013  . Hyperlipidemia associated with type 2 diabetes mellitus (HCC) 03/09/2013  . Essential hypertension, benign 03/09/2013  . Nonulcer dyspepsia 03/09/2013   Home  Medication(s) Prior to Admission medications   Medication Sig Start Date End Date Taking? Authorizing Provider  acetaminophen (TYLENOL) 325 MG tablet Take 2 tablets (650 mg total) by mouth every 6 (six) hours as needed. 03/14/21   Alford Highland, MD  amLODipine (NORVASC) 10 MG tablet Take 1 tablet (10 mg total) by mouth daily. Patient taking differently: Take 10 mg by mouth daily as needed (high blood pressure). 09/07/22   Sherlene Shams, MD  atenolol (TENORMIN) 25 MG tablet Take 25 mg by mouth 2 (two) times daily. 07/01/21   [provider]  Cholecalciferol (VITAMIN D3) 1000 UNITS CAPS Take 1,000 Units by mouth every morning.     [provider]  docusate sodium (COLACE) 100 MG capsule Take 2 capsules (200 mg total) by mouth 2 (two) times daily. 03/14/21   Wieting, Richard, MD  glucose blood (ONE TOUCH ULTRA TEST) test strip USE 1 STRIP TO CHECK GLUCOSE TWICE DAILY  Dx code: E11.9 05/29/19   Sherlene Shams, MD  glucose blood (ONE TOUCH ULTRA TEST) test strip USE ONE STRIP TO CHECK GLUCOSE ONCE  DAILY 06/02/19   Sherlene Shams, MD  glucose blood test strip Use to check blood sugars once daily. 09/01/21   Sherlene Shams, MD  hydrochlorothiazide (HYDRODIURIL) 25 MG tablet Take 25 mg by mouth every other day.    [provider]  isosorbide mononitrate (IMDUR) 60 MG 24 hr tablet Take 60 mg by mouth 2 (two) times daily.    [provider]  Lancets [facility] DELICA PLUS [place]) MISC Place 2 Devices inside cheek daily. DX Code E11.9. 09/01/21   Sherlene Shams, MD  lisinopril (ZESTRIL) 40 MG tablet Take 1 tablet (40 mg total) by mouth 2 (two) times daily. 09/07/22   Sherlene Shams, MD  meclizine (ANTIVERT) 25 MG tablet Take 1 tablet by mouth three times daily as needed 07/01/22   Sherlene Shams, MD  neomycin-polymyxin b-dexamethasone (MAXITROL) 3.5-10000-0.1 SUSP Place 1 drop into both eyes 2 (two) times daily. 10/22/22 11/01/22  [provider]  nitroGLYCERIN  (NITROSTAT) 0.4 MG SL tablet Place 0.4 mg under the tongue every 5 (five) minutes as needed for chest pain.    [provider]  [facility] ULTRA test strip USE 1 STRIP TO CHECK GLUCOSE TWICE DAILY 05/15/19   Sherlene Shams, MD  Polyethyl Glycol-Propyl Glycol 0.4-0.3 % SOLN Place 1 drop into both eyes 3 (three) times daily as needed (for dry/irritated eyes.).    [provider]  psyllium (METAMUCIL) 58.6 % packet Take 1 packet by mouth daily as needed (for regularity).     [provider]  warfarin (COUMADIN) 2 MG tablet Take 2 mg by mouth See admin instructions. Take 1 tablet (2mg ) by mouth every Sunday and Wednesday night    [provider]  warfarin (COUMADIN) 4 MG tablet Take 4 mg by mouth See admin instructions. Take 2 tablets (4mg ) by mouth every Monday, Tuesday, Thursday, Friday and Saturday night    [provider]  Allergies Codeine, Penicillins, Atorvastatin, Clonidine derivatives, Gabapentin, Losartan, Morphine, Morphine and related, Reglan [metoclopramide], Valtrex [valacyclovir], Amlodipine, Hydralazine, and Spironolactone  Review of Systems Review of Systems As noted in HPI  Physical Exam Vital Signs  I have reviewed the triage vital signs BP (!) 225/84   Pulse 66   Resp 19   Ht 5\' 6"  (1.676 m)   Wt 54.4 kg   SpO2 100%   BMI 19.36 kg/m   Physical Exam Vitals reviewed.  Constitutional:      General: She is not in acute distress.    Appearance: She is well-developed. She is not diaphoretic.  HENT:     Head: Normocephalic and atraumatic.     Right Ear: External ear normal.     Left Ear: External ear normal.     Nose: Nose normal.  Eyes:     General: No scleral icterus.    Conjunctiva/sclera: Conjunctivae normal.  Neck:     Trachea: Phonation normal.  Cardiovascular:     Rate and Rhythm: Regular  rhythm.  Pulmonary:     Effort: Pulmonary effort is normal. No respiratory distress.     Breath sounds: No stridor.  Abdominal:     General: There is no distension.  Musculoskeletal:        General: Normal range of motion.     Cervical back: Normal range of motion.  Neurological:     Mental Status: She is alert and oriented to person, place, and time.     Comments: Mental Status:  Alert and oriented to person, place, and time.  Attention and concentration normal.  Speech dysarthric (baseline).  Recent memory is intact  Cranial Nerves:  II Visual Fields: Intact to confrontation. Visual fields intact. III, IV, VI: Pupils equal and reactive to light and near. Full eye movement without nystagmus  V Facial Sensation: Normal. No weakness of masticatory muscles  VII: No facial weakness or asymmetry  VIII Auditory Acuity: hard of hearing (baseline) IX/X: The uvula is midline; the palate elevates symmetrically  XI: Normal sternocleidomastoid and trapezius strength  XII: The tongue is midline. No atrophy or fasciculations.   Motor System: Muscle Strength: 5/5 and symmetric in the upper and lower extremities. No pronation or drift.  Muscle Tone: Tone and muscle bulk are normal in the upper and lower extremities.  Reflexes: DTRs: No Clonus Coordination: Intact finger-to-nose. Essential tremor bilateral (baseline) Sensation: Intact to light touch.  Gait: deferred   Psychiatric:        Behavior: Behavior normal.     ED Results and Treatments Labs (all labs ordered are listed, but only abnormal results are displayed) Labs Reviewed - No data to display                                                                                                                       EKG  EKG Interpretation  Date/Time:    Ventricular Rate:    PR Interval:    QRS Duration:   QT  Interval:    QTC Calculation:   R Axis:     Text Interpretation:         Radiology CT Head Wo Contrast  Result  Date: 10/23/2022 CLINICAL DATA:  Provided history: Headache, increasing frequency or severity. EXAM: CT HEAD WITHOUT CONTRAST TECHNIQUE: Contiguous axial images were obtained from the base of the skull through the vertex without intravenous contrast. RADIATION DOSE REDUCTION: This exam was performed according to the departmental dose-optimization program which includes automated exposure control, adjustment of the mA and/or kV according to patient size and/or use of iterative reconstruction technique. COMPARISON:  Head CT 05/21/2022. FINDINGS: Brain: Generalized parenchymal atrophy. Suspected 4 mm partially calcified meningioma overlying the left parietal lobe, unchanged from the prior head CT 05/21/2022. Redemonstrated chronic lacunar infarct within the right basal ganglia/internal capsule. Background mild patchy and ill-defined hypoattenuation within the cerebral white matter, nonspecific but compatible with chronic small vessel ischemic disease. There is no acute intracranial hemorrhage. No demarcated cortical infarct. No extra-axial fluid collection. No midline shift. Vascular: No hyperdense vessel.  Atherosclerotic calcifications. Skull: No fracture or aggressive osseous lesion. Sinuses/Orbits: No mass or acute finding within the imaged orbits. No significant paranasal sinus disease at the imaged levels. IMPRESSION: 1. No evidence of acute intracranial hemorrhage or acute infarct. 2. Suspected 4 mm partially calcified meningioma overlying the left parietal lobe, unchanged from the prior head CT of 05/21/2022. 3. Redemonstrated chronic lacunar infarct within the right basal ganglia/internal capsule. 4. Background mild cerebral white matter chronic small vessel ischemic disease. 5. Generalized parenchymal atrophy. Electronically Signed   By: Jackey Loge D.O.   On: 10/23/2022 14:45    Medications Ordered in ED Medications  labetalol (NORMODYNE) infusion 5 mg/mL (0.5 mg/min Intravenous New Bag/Given 10/23/22  2349)                                                                                                                                     Procedures Procedures  (including critical care time)  Medical Decision Making / ED Course  Click here for ABCD2, HEART and other calculators  Medical Decision Making Amount and/or Complexity of Data Reviewed Radiology: ordered.    This patient presents to the ED for concerns noted below, this involves an extensive number of treatment options, and is a complaint that carries with it a high risk of complications and morbidity. The differential diagnosis includes but not limited to:  Headache Resolved after migraine cocktail at [facility]. Likely migranous give her PMH.  Hallucinations Sent here to rule out CVA vs PRESS.  Noted to be HTN with sbp 220  Initial intervention:  Labetalol gtt  Work up Interpretation and Management:  Cardiac Monitoring/EKG: ***  Laboratory Tests ordered listed below with my independent interpretation: ***   Imaging Studies ordered listed below with my independent interpretation: ***    Reassessment: ***       Final Clinical Impression(s) / ED Diagnoses  Final diagnoses:  None    {Document critical care time when appropriate:1}  {Document review of labs and clinical decision tools ie heart score, Chads2Vasc2 etc:1}  {Document your independent review of radiology images, and any outside records:1} {Document your discussion with family members, caretakers, and with consultants:1} {Document social determinants of health affecting pt's care:1} {Document your decision making why or why not admission, treatments were needed:1} This chart was dictated using voice recognition software.  Despite best efforts to proofread,  errors can occur which can change the documentation meaning.

## 2022-10-23 NOTE — Assessment & Plan Note (Addendum)
Would require neuro imaging  and workup for infection in the setting of hallucination and worst headache of her life.  Advised patient to go to the ER for further evaluation.

## 2022-10-23 NOTE — ED Provider Notes (Signed)
[facility] Provider Note    Event Date/Time   First MD Initiated Contact with Patient 10/23/22 1620     (approximate)   History   Headache (3 days) and Hallucinations   HPI  Leah Holmes is a 89 y.o. female   Past medical history of migraine headaches, atrial fibrillation on warfarin, pacemaker, hypertension, hyperlipidemia, CHF, diabetes who presents to the emergency department with left-sided headache behind the eyes associated with visual changes and hallucinations of bright lights and occasional figures that she sees.  Gets slightly better with Tylenol.  Was seen by [facility] ophthalmologist yesterday with full eye exam per patient and daughter with negative ophthalmologic findings to explain her symptoms.  She denies any focal infectious symptoms like respiratory complaints, GI or GU complaints.  She denies trauma.  Independent Historian contributed to assessment above: daughter bedside corroborates information given above.      Physical Exam   Triage Vital Signs: ED Triage Vitals  Enc Vitals Group     BP 10/23/22 1328 (!) 176/76     Pulse Rate 10/23/22 1328 64     Resp 10/23/22 1328 18     Temp 10/23/22 1328 97.9 F (36.6 C)     Temp Source 10/23/22 1328 Oral     SpO2 10/23/22 1328 95 %     Weight 10/23/22 1330 120 lb (54.4 kg)     Height 10/23/22 1330 5\' 6"  (1.676 m)     Head Circumference --      Peak Flow --      Pain Score 10/23/22 1329 5     Pain Loc --      Pain Edu? --      Excl. in GC? --     Most recent vital signs: Vitals:   10/23/22 2129 10/23/22 2158  BP: (!) 186/63 (!) 214/119  Pulse: (!) 55 64  Resp: 18 18  Temp: 97.7 F (36.5 C) 97.6 F (36.4 C)  SpO2: 96% 97%    General: Awake, no distress.  CV:  Good peripheral perfusion.  Resp:  Normal effort.  Abd:  No distention.  Other:  Awake alert comfortable with no focal neurologic deficits including facial asymmetry, extraocular movements,  finger-to-nose, motor or sensory to bilateral upper and lower extremities.  She is hypertensive.  Neck is supple with full range of motion.   ED Results / Procedures / Treatments   Labs (all labs ordered are listed, but only abnormal results are displayed) Labs Reviewed  COMPREHENSIVE METABOLIC PANEL - Abnormal; Notable for the following components:      Result Value   Glucose, Bld 161 (*)    All other components within normal limits  PROTIME-INR - Abnormal; Notable for the following components:   Prothrombin Time 22.6 (*)    INR 2.0 (*)    All other components within normal limits  APTT - Abnormal; Notable for the following components:   aPTT 47 (*)    All other components within normal limits  CBC WITH DIFFERENTIAL/PLATELET     I ordered and reviewed the above labs they are notable for cell counts and electrolytes within normal limit  RADIOLOGY I independently reviewed and interpreted CT scan of the head see no obvious bleeding or midline shift   PROCEDURES:  Critical Care performed: Yes, see critical care procedure note(s)  .Critical Care  Performed by: Pilar Jarvis, MD Authorized by: Pilar Jarvis, MD   Critical care provider statement:    Critical care  time (minutes):  30   Critical care was necessary to treat or prevent imminent or life-threatening deterioration of the following conditions:  CNS failure or compromise   Critical care was time spent personally by me on the following activities:  Development of treatment plan with patient or surrogate, discussions with consultants, evaluation of patient's response to treatment, examination of patient, ordering and review of laboratory studies, ordering and review of radiographic studies, ordering and performing treatments and interventions, pulse oximetry, re-evaluation of patient's condition and review of old charts    MEDICATIONS ORDERED IN ED: Medications  sodium chloride 0.9 % bolus 500 mL (0 mLs Intravenous Stopped  10/23/22 1907)  labetalol (NORMODYNE) injection 10 mg (10 mg Intravenous Given 10/23/22 1747)  prochlorperazine (COMPAZINE) injection 10 mg (10 mg Intravenous Given 10/23/22 1746)  diphenhydrAMINE (BENADRYL) injection 12.5 mg (12.5 mg Intravenous Given 10/23/22 1746)    External physician / consultants:  I spoke with Dr. Bing Neighbors of neurology regarding care plan for this patient.   IMPRESSION / MDM / ASSESSMENT AND PLAN / ED COURSE  I reviewed the triage vital signs and the nursing notes.                                Patient's presentation is most consistent with acute presentation with potential threat to life or bodily function.  Differential diagnosis includes, but is not limited to, complex migraine headache, intracranial bleeding, CVA, posterior reversible encephalopathic syndrome, hypertensive emergency   The patient is on the cardiac monitor to evaluate for evidence of arrhythmia and/or significant heart rate changes.  MDM: This is a patient with headache high blood pressure and visual changes that had a ophthalmologic exam earlier for the same symptoms not thought to be ophthalmologic related, concerns for neurologic pathology including complex migraine, pres, CVA.  Outside of stroke window.  Also on blood thinners.  I spoke with Dr. Bing Neighbors of neurology who recommends MRI however the patient has pacemaker and cannot get an MRI at [facility], recommendation by neurologist was a transfer to [facility].  Accepted by [facility] ED to ED transfer.  Patient remains hypertensive but stable and states that her visual complaints and headache have mostly resolved at this time after migraine cocktail.  I did give her labetalol when her blood pressure was nearing 230 systolic after which she got better to around 200 systolic.  Transfer       FINAL CLINICAL IMPRESSION(S) / ED DIAGNOSES   Final diagnoses:  Other migraine without status migrainosus, not intractable  Vision  changes     Rx / DC Orders   ED Discharge Orders     None        Note:  This document was prepared using Dragon voice recognition software and may include unintentional dictation errors.    Pilar Jarvis, MD 10/23/22 [phone]

## 2022-10-23 NOTE — Assessment & Plan Note (Addendum)
New symptoms, no history of depression or anxiety. Need further evaluation.

## 2022-10-24 ENCOUNTER — Other Ambulatory Visit: Payer: Self-pay

## 2022-10-24 ENCOUNTER — Emergency Department (HOSPITAL_COMMUNITY): Payer: Medicare Other

## 2022-10-24 ENCOUNTER — Observation Stay (HOSPITAL_COMMUNITY): Payer: Medicare Other

## 2022-10-24 DIAGNOSIS — J90 Pleural effusion, not elsewhere classified: Secondary | ICD-10-CM | POA: Diagnosis not present

## 2022-10-24 DIAGNOSIS — R4182 Altered mental status, unspecified: Secondary | ICD-10-CM | POA: Diagnosis not present

## 2022-10-24 DIAGNOSIS — R441 Visual hallucinations: Secondary | ICD-10-CM

## 2022-10-24 DIAGNOSIS — R519 Headache, unspecified: Secondary | ICD-10-CM | POA: Diagnosis present

## 2022-10-24 DIAGNOSIS — G8929 Other chronic pain: Secondary | ICD-10-CM | POA: Diagnosis not present

## 2022-10-24 DIAGNOSIS — R443 Hallucinations, unspecified: Secondary | ICD-10-CM | POA: Diagnosis not present

## 2022-10-24 DIAGNOSIS — J811 Chronic pulmonary edema: Secondary | ICD-10-CM | POA: Diagnosis not present

## 2022-10-24 DIAGNOSIS — I350 Nonrheumatic aortic (valve) stenosis: Secondary | ICD-10-CM

## 2022-10-24 LAB — URINALYSIS, ROUTINE W REFLEX MICROSCOPIC
Bilirubin Urine: NEGATIVE
Glucose, UA: NEGATIVE mg/dL
Hgb urine dipstick: NEGATIVE
Ketones, ur: NEGATIVE mg/dL
Leukocytes,Ua: NEGATIVE
Nitrite: NEGATIVE
Protein, ur: NEGATIVE mg/dL
Specific Gravity, Urine: 1.014 (ref 1.005–1.030)
pH: 6 (ref 5.0–8.0)

## 2022-10-24 LAB — PROTIME-INR
INR: 2 — ABNORMAL HIGH (ref 0.8–1.2)
Prothrombin Time: 22.7 seconds — ABNORMAL HIGH (ref 11.4–15.2)

## 2022-10-24 LAB — GLUCOSE, CAPILLARY
Glucose-Capillary: 139 mg/dL — ABNORMAL HIGH (ref 70–99)
Glucose-Capillary: 120 mg/dL — ABNORMAL HIGH (ref 70–99)

## 2022-10-24 LAB — TSH: TSH: 0.986 u[IU]/mL (ref 0.350–4.500)

## 2022-10-24 LAB — VITAMIN B12: Vitamin B-12: 504 pg/mL (ref 180–914)

## 2022-10-24 MED ORDER — INSULIN ASPART 100 UNIT/ML IJ SOLN
0.0000 [IU] | Freq: Three times a day (TID) | INTRAMUSCULAR | Status: DC
  Administered 2022-10-24 – 2022-10-25 (×2): 1 [IU] via SUBCUTANEOUS
  Administered 2022-10-25 – 2022-10-26 (×2): 3 [IU] via SUBCUTANEOUS
  Administered 2022-10-26: 5 [IU] via SUBCUTANEOUS
  Administered 2022-10-26: 2 [IU] via SUBCUTANEOUS
  Administered 2022-10-27: 3 [IU] via SUBCUTANEOUS
  Administered 2022-10-27: 2 [IU] via SUBCUTANEOUS
  Administered 2022-10-27 – 2022-10-28 (×4): 3 [IU] via SUBCUTANEOUS
  Administered 2022-10-29: 5 [IU] via SUBCUTANEOUS
  Administered 2022-10-29: 2 [IU] via SUBCUTANEOUS
  Administered 2022-10-29: 3 [IU] via SUBCUTANEOUS
  Administered 2022-10-30: 2 [IU] via SUBCUTANEOUS
  Administered 2022-10-30: 7 [IU] via SUBCUTANEOUS
  Administered 2022-10-31: 2 [IU] via SUBCUTANEOUS
  Administered 2022-10-31: 5 [IU] via SUBCUTANEOUS
  Administered 2022-10-31: 2 [IU] via SUBCUTANEOUS
  Administered 2022-11-01: 3 [IU] via SUBCUTANEOUS
  Administered 2022-11-01: 1 [IU] via SUBCUTANEOUS
  Administered 2022-11-01: 2 [IU] via SUBCUTANEOUS
  Administered 2022-11-02 (×2): 1 [IU] via SUBCUTANEOUS
  Administered 2022-11-02: 3 [IU] via SUBCUTANEOUS

## 2022-10-24 MED ORDER — POLYVINYL ALCOHOL 1.4 % OP SOLN
1.0000 [drp] | Freq: Four times a day (QID) | OPHTHALMIC | Status: DC | PRN
  Administered 2022-10-31 – 2022-11-01 (×3): 1 [drp] via OPHTHALMIC
  Filled 2022-10-24: qty 15

## 2022-10-24 MED ORDER — MECLIZINE HCL 25 MG PO TABS
25.0000 mg | ORAL_TABLET | Freq: Three times a day (TID) | ORAL | Status: DC | PRN
  Administered 2022-10-29: 25 mg via ORAL
  Filled 2022-10-24 (×2): qty 1

## 2022-10-24 MED ORDER — POLYETHYL GLYCOL-PROPYL GLYCOL 0.4-0.3 % OP GEL: Freq: Four times a day (QID) | OPHTHALMIC | Status: DC | PRN

## 2022-10-24 MED ORDER — ISOSORBIDE MONONITRATE ER 30 MG PO TB24
60.0000 mg | ORAL_TABLET | Freq: Once | ORAL | Status: AC
  Administered 2022-10-24: 60 mg via ORAL
  Filled 2022-10-24: qty 2

## 2022-10-24 MED ORDER — LISINOPRIL 20 MG PO TABS
40.0000 mg | ORAL_TABLET | Freq: Two times a day (BID) | ORAL | Status: DC
  Administered 2022-10-24 – 2022-11-02 (×16): 40 mg via ORAL
  Filled 2022-10-24 (×17): qty 2

## 2022-10-24 MED ORDER — ISOSORBIDE MONONITRATE ER 60 MG PO TB24
60.0000 mg | ORAL_TABLET | Freq: Two times a day (BID) | ORAL | Status: DC
  Administered 2022-10-24 – 2022-11-02 (×16): 60 mg via ORAL
  Filled 2022-10-24 (×4): qty 1
  Filled 2022-10-24: qty 2
  Filled 2022-10-24 (×12): qty 1

## 2022-10-24 MED ORDER — HYDROCHLOROTHIAZIDE 25 MG PO TABS
25.0000 mg | ORAL_TABLET | ORAL | Status: DC
  Administered 2022-10-24: 25 mg via ORAL
  Filled 2022-10-24: qty 1

## 2022-10-24 MED ORDER — WARFARIN SODIUM 4 MG PO TABS
4.0000 mg | ORAL_TABLET | Freq: Once | ORAL | Status: AC
  Administered 2022-10-24: 4 mg via ORAL
  Filled 2022-10-24: qty 1

## 2022-10-24 MED ORDER — ACETAMINOPHEN 325 MG PO TABS
650.0000 mg | ORAL_TABLET | Freq: Four times a day (QID) | ORAL | Status: DC | PRN
  Administered 2022-10-24 – 2022-10-25 (×2): 650 mg via ORAL
  Filled 2022-10-24 (×2): qty 2

## 2022-10-24 MED ORDER — METOPROLOL TARTRATE 5 MG/5ML IV SOLN
2.5000 mg | Freq: Three times a day (TID) | INTRAVENOUS | Status: DC | PRN
  Administered 2022-10-25: 2.5 mg via INTRAVENOUS
  Filled 2022-10-24: qty 5

## 2022-10-24 MED ORDER — LISINOPRIL 20 MG PO TABS
40.0000 mg | ORAL_TABLET | Freq: Once | ORAL | Status: AC
  Administered 2022-10-24: 40 mg via ORAL
  Filled 2022-10-24: qty 2

## 2022-10-24 MED ORDER — AMLODIPINE BESYLATE 5 MG PO TABS
10.0000 mg | ORAL_TABLET | Freq: Once | ORAL | Status: AC
  Administered 2022-10-24: 10 mg via ORAL
  Filled 2022-10-24: qty 2

## 2022-10-24 MED ORDER — ATENOLOL 25 MG PO TABS
25.0000 mg | ORAL_TABLET | Freq: Once | ORAL | Status: AC
  Administered 2022-10-24: 25 mg via ORAL
  Filled 2022-10-24: qty 1

## 2022-10-24 MED ORDER — NEOMYCIN-POLYMYXIN-DEXAMETH 3.5-10000-0.1 OP SUSP
1.0000 [drp] | Freq: Two times a day (BID) | OPHTHALMIC | Status: DC
  Administered 2022-10-24 – 2022-11-02 (×17): 1 [drp] via OPHTHALMIC
  Filled 2022-10-24 (×2): qty 5

## 2022-10-24 MED ORDER — ACETAMINOPHEN 650 MG RE SUPP: 650.0000 mg | Freq: Four times a day (QID) | RECTAL | Status: DC | PRN

## 2022-10-24 MED ORDER — WARFARIN SODIUM 4 MG PO TABS: 4.0000 mg | ORAL_TABLET | Freq: Once | ORAL | Status: DC

## 2022-10-24 MED ORDER — WARFARIN - PHARMACIST DOSING INPATIENT: Freq: Every day | Status: DC

## 2022-10-24 MED ORDER — ATENOLOL 25 MG PO TABS
25.0000 mg | ORAL_TABLET | Freq: Two times a day (BID) | ORAL | Status: DC
  Administered 2022-10-24 – 2022-11-02 (×16): 25 mg via ORAL
  Filled 2022-10-24 (×17): qty 1

## 2022-10-24 NOTE — Assessment & Plan Note (Signed)
PT to see Check B12/TSH

## 2022-10-24 NOTE — ED Notes (Signed)
Family at bedside. Update given.

## 2022-10-24 NOTE — ED Notes (Signed)
Patient c/o pain to IV site, upon assessment moderate bruising and swelling noted. IV removed and compression bandage applied. No replacement IV placed as there is currently no indication for same.

## 2022-10-24 NOTE — Assessment & Plan Note (Signed)
Allergy to atorvastatin Not on statin medication

## 2022-10-24 NOTE — ED Provider Notes (Signed)
Patient handed off to me at 7 AM.  Sounds like she has been having some new hallucinations last few days.  History of hypertension, high cholesterol, CAD, CHF, atrial fibrillation on Coumadin.  She was seen at [facility] was recommended to come for an MRI to rule out stroke.  Headaches resolved.  She has been having some headaches all week which is typical of her but with more frequency.  Having some visual hallucinations last few days.  Was having some vision issues.  Saw an ophthalmologist here recently yesterday or the day before which is unremarkable eye workup.  She was sent here for MRI from [place] because she has a pacemaker however we do not have anybody available to disengage the pacemaker while she is an MRI this weekend.  I have talked with Dr. Iver Nestle with neurology and overall will repeat a head CT today, get EEG and then likely MRI Monday.  They will come and consult.  Ultimately she lives at home with her daughter.  She has no fever.  No white count.  I have no concern for infectious process from a neurologic standpoint.  However will look for urine infection/pneumonia.  Does not sound like she started any new medications.  There has not been any concern for dementia.  My suspicion is maybe she is not been sleeping well given some recent headaches.  Overall we will admit to hospitalist for remaining workup.  This chart was dictated using voice recognition software.  Despite best efforts to proofread,  errors can occur which can change the documentation meaning.    Virgina Norfolk, DO 10/24/22 [phone]

## 2022-10-24 NOTE — Assessment & Plan Note (Addendum)
89 year old presenting with acute, intractable headache described as worse of her life that woke her up with worsening visual hallucinations -obs to tele  -neurology consulted  -she states her headache is gone  -CT head x2 wnl -plan for MRI on Monday with PPM  -frequent neuro checks, does have mild right tongue deviation, but no other acute focal deficits -failed her swallow screen: SLP eval pending  -EEG per neurology  -tylenol PRN for now if needed, may need something more if headache returns  -known carotid artery stenosis-check carotid US

## 2022-10-24 NOTE — Assessment & Plan Note (Addendum)
Only having visual hallucinations that persist even when she has no headache -head CT with no acute finding, repeat stable -UA clear/CXR clear. No infectious source at this time.  -will check TSH/B12 -she has a resting tremor +intentional tremor and very strong family history of PD. Daughter states recent shuffling gait. Worth investigating with neurology since hallucinations common with PD -cognitively sharp, not dementia related  -if no organic cause, consider psychiatry consult -delirium precautions

## 2022-10-24 NOTE — Assessment & Plan Note (Addendum)
S/p pacemaker insertion in 2017: Patient's last pacemaker interrogation on 03/2022 showed an adequate battery life of 5.5 years.

## 2022-10-24 NOTE — Consult Note (Signed)
Neurology Consultation  Reason for Consult: Hallucinations, headache Referring Physician: Dr. Lockie Mola  CC: Hallucinations  History is obtained from: Patient, patient's daughter at bedside, chart review  HPI: Leah Holmes is a 89 y.o. female with a medical history significant for atrial fibrillation on Coumadin, CAD, CHF, HLD, HTN, IBS, essential tremor, PPM, left breast cancer s/p lumpectomy and radiation, and type 2 diabetes mellitus who presented to the ED on 5/10 for evaluation of headache, hallucinations.  Patient has reportedly had ongoing headaches for many years and intermittent diplopia for 1 year that her eye doctors have attributed to her cataracts.  Patient's daughter reports that the patient seems to always have a headache that is worse with higher blood pressures.  Patient is currently taking 3 blood pressure medications for adequate control.  Patient is usual headaches are bilateral, temporal, sometimes periorbital left >right rated a 6/10 in severity.  Usually her headaches are adequately controlled with extra strength Tylenol.  Over the past 3 to 4 days,, patient states her headaches have gotten much worse but rates them 6/10 in severity still.  Her headaches have woken her from sleep this week which is atypical.  In addition to having headaches, the patient has had hallucinations this week.  Patient states "sometimes I see a dog, sometimes a flower, sometimes a curtain comes down, sometimes I see double".  Patient's daughter states that she sees a one armed man, 2 people walking on the street with the dog that suddenly becomes 4 people, and movement of objects that are stationary.  Over the past year, the patient has had visualizations described as a windowpane coming at her face that were also attributed to cataracts but hallucinating objects that are not and they are is new for the past 3 to 4 days.  The patient denies any loss of sleep, recent illness, change in medications,  or confusion.  Patient's daughter does express concern due to the patient's brother being diagnosed with a brain tumor at age 12 with symptoms that also started with hallucinations.   ROS: A complete ROS was performed and is negative except as noted in the HPI.   Past Medical History:  Diagnosis Date   Atrial fibrillation (HCC) 2012   Breast cancer (HCC) 04/2018   IDC of left breast    CHF (congestive heart failure) (HCC)    Colon polyp 2003   Compression fracture of L2 lumbar vertebra (HCC)    Coronary artery disease    COVID-19 virus infection 12/25/2019   Diabetes mellitus without complication (HCC)    Dyspnea    Dysrhythmia    atrial fib   Environmental and seasonal allergies    GERD (gastroesophageal reflux disease)    Hyperlipidemia    Hypertension    Hypertensive urgency 03/10/2021   Irritable bowel syndrome    Multiple rib fractures 03/15/2018   Personal history of radiation therapy    Polyp of colon    PONV (postoperative nausea and vomiting)    with ether, not recently "woke up in surgery once"   Presence of permanent cardiac pacemaker    Tremor    Tremors of nervous system    Ulcer    Past Surgical History:  Procedure Laterality Date   ABDOMINAL ADHESION SURGERY     ABDOMINAL HYSTERECTOMY     APPENDECTOMY     BREAST BIOPSY Right late 80s/early 90s   benign   BREAST BIOPSY Left 03/31/2018   [place] bx done with Dr. Lemar Livings, invasive ductal carcinoma  BREAST LUMPECTOMY Left 04/22/2018   Procedure: BREAST LUMPECTOMY;  Surgeon: Earline Mayotte, MD;  Location: [facility];  Service: General;  Laterality: Left;   BREAST LUMPECTOMY Left 04/22/2018   [facility], negative LN, clear margins   BREAST LUMPECTOMY Left 2020   positive, done in office   BREAST SURGERY Right    biopsy    CESAREAN SECTION     x 2   CHOLECYSTECTOMY  03/2012   Dr Excell Seltzer   COLONOSCOPY  2003   Dr Fritzi Mandes in [place]   fibroid tumor     PACEMAKER INSERTION N/A 01/28/2016   Procedure: INSERTION  PACEMAKER;  Surgeon: Marcina Millard, MD;  Location: [facility];  Service: Cardiovascular;  Laterality: N/A;   SENTINEL NODE BIOPSY Left 04/22/2018   Procedure: SENTINEL NODE BIOPSY;  Surgeon: Earline Mayotte, MD;  Location: [facility];  Service: General;  Laterality: Left;   UPPER GI ENDOSCOPY  2003   Dr Fritzi Mandes in [place]   Family History  Problem Relation Age of Onset   Breast cancer Sister    Cancer Sister 35       breast   Breast cancer Maternal Aunt 50   Breast cancer Cousin    Breast cancer Other 29   Social History:   reports that she has never smoked. She has quit using smokeless tobacco.  Her smokeless tobacco use included snuff. She reports that she does not drink alcohol and does not use drugs.  Medications No current facility-administered medications for this encounter.  Current Outpatient Medications:    acetaminophen (TYLENOL) 325 MG tablet, Take 2 tablets (650 mg total) by mouth every 6 (six) hours as needed., Disp: , Rfl:    amLODipine (NORVASC) 10 MG tablet, Take 1 tablet (10 mg total) by mouth daily. (Patient taking differently: Take 10 mg by mouth daily as needed (high blood pressure).), Disp: 30 tablet, Rfl: 0   atenolol (TENORMIN) 25 MG tablet, Take 25 mg by mouth 2 (two) times daily., Disp: , Rfl:    Cholecalciferol (VITAMIN D3) 1000 UNITS CAPS, Take 1,000 Units by mouth every morning. , Disp: , Rfl:    docusate sodium (COLACE) 100 MG capsule, Take 2 capsules (200 mg total) by mouth 2 (two) times daily., Disp: 60 capsule, Rfl: 0   glucose blood (ONE TOUCH ULTRA TEST) test strip, USE 1 STRIP TO CHECK GLUCOSE TWICE DAILY  Dx code: E11.9, Disp: 200 each, Rfl: 3   glucose blood (ONE TOUCH ULTRA TEST) test strip, USE ONE STRIP TO CHECK GLUCOSE ONCE  DAILY, Disp: 100 each, Rfl: 3   glucose blood test strip, Use to check blood sugars once daily., Disp: 100 each, Rfl: 12   hydrochlorothiazide (HYDRODIURIL) 25 MG tablet, Take 25 mg by mouth every other day., Disp: ,  Rfl:    isosorbide mononitrate (IMDUR) 60 MG 24 hr tablet, Take 60 mg by mouth 2 (two) times daily., Disp: , Rfl:    Lancets (ONETOUCH DELICA PLUS LANCET33G) MISC, Place 2 Devices inside cheek daily. DX Code E11.9., Disp: 100 each, Rfl: 2   lisinopril (ZESTRIL) 40 MG tablet, Take 1 tablet (40 mg total) by mouth 2 (two) times daily., Disp: 180 tablet, Rfl: 0   meclizine (ANTIVERT) 25 MG tablet, Take 1 tablet by mouth three times daily as needed, Disp: 40 tablet, Rfl: 5   neomycin-polymyxin b-dexamethasone (MAXITROL) 3.5-10000-0.1 SUSP, Place 1 drop into both eyes 2 (two) times daily., Disp: , Rfl:    nitroGLYCERIN (NITROSTAT) 0.4 MG SL tablet, Place 0.4  mg under the tongue every 5 (five) minutes as needed for chest pain., Disp: , Rfl:    ONETOUCH ULTRA test strip, USE 1 STRIP TO CHECK GLUCOSE TWICE DAILY, Disp: 100 each, Rfl: 0   Polyethyl Glycol-Propyl Glycol 0.4-0.3 % SOLN, Place 1 drop into both eyes 3 (three) times daily as needed (for dry/irritated eyes.)., Disp: , Rfl:    psyllium (METAMUCIL) 58.6 % packet, Take 1 packet by mouth daily as needed (for regularity). , Disp: , Rfl:    warfarin (COUMADIN) 2 MG tablet, Take 2 mg by mouth See admin instructions. Take 1 tablet (2mg ) by mouth every Sunday and Wednesday night, Disp: , Rfl:    warfarin (COUMADIN) 4 MG tablet, Take 4 mg by mouth See admin instructions. Take 2 tablets (4mg ) by mouth every Monday, Tuesday, Thursday, Friday and Saturday night, Disp: , Rfl:   Exam: Current vital signs: BP (!) 155/68   Pulse 60   Temp 98.4 F (36.9 C) (Oral)   Resp 14   Ht 5\' 6"  (1.676 m)   Wt 54.4 kg   SpO2 99%   BMI 19.36 kg/m  Vital signs in last 24 hours: Temp:  [97.6 F (36.4 C)-98.4 F (36.9 C)] 98.4 F (36.9 C) (05/11 0653) Pulse Rate:  [55-84] 60 (05/11 0700) Resp:  [12-25] 14 (05/11 0700) BP: (129-233)/(59-178) 155/68 (05/11 0700) SpO2:  [95 %-100 %] 99 % (05/11 0700) Weight:  [54.4 kg] 54.4 kg (05/10 2329)  GENERAL: Awake, alert, in  no acute distress, essential tremor present of bilateral upper extremities and head Psych: Affect appropriate for situation, patient is calm and cooperative with examination Head: Normocephalic and atraumatic, without obvious abnormality EENT: Normal conjunctivae, dry mucous membranes, no OP obstruction LUNGS: Normal respiratory effort. Non-labored breathing on room air CV: Regular rate and rhythm on telemetry ABDOMEN: Soft, non-tender, non-distended Extremities: Warm, well perfused, without obvious deformity  NEURO:  Mental Status: Awake, alert, and oriented to person, place, time, and situation. She is able to provide a clear and coherent history of present illness. Speech/Language: speech is intact without dysarthria  Naming, repetition, fluency, and comprehension intact without aphasia  No neglect is noted Cranial Nerves:  II: PERRL. Visual fields full.  III, IV, VI: EOMI without gaze preference or diplopia V: Sensation is intact to light touch and symmetrical to face.   VII: Face is symmetric resting and smiling.  VIII: Hearing is intact to loud voice IX, X: Palate elevation is symmetric. Phonation normal.  XI: Normal sternocleidomastoid and trapezius muscle strength XII: Tongue protrudes midline without fasciculations.   Motor: 5/5 strength is all muscle groups without unilateral weakness.  Tone is normal. Bulk is normal.  Sensation: Intact to light touch bilaterally in all four extremities. No extinction to DSS present.  Coordination: FTN intact bilaterally. HKS intact bilaterally. No pronator drift. Gait: Deferred  Labs I have reviewed labs in epic and the results pertinent to this consultation are: CBC    Component Value Date/Time   WBC 5.0 10/23/2022 1339   RBC 4.36 10/23/2022 1339   HGB 13.8 10/23/2022 1339   HGB 14.4 10/05/2014 1520   HCT 41.4 10/23/2022 1339   HCT 42.9 10/05/2014 1520   PLT 223 10/23/2022 1339   PLT 205 10/05/2014 1520   MCV 95.0 10/23/2022  1339   MCV 93 10/05/2014 1520   MCH 31.7 10/23/2022 1339   MCHC 33.3 10/23/2022 1339   RDW 12.8 10/23/2022 1339   RDW 13.8 10/05/2014 1520   LYMPHSABS 1.1 10/23/2022  1339   LYMPHSABS 1.7 03/28/2012 0556   MONOABS 0.5 10/23/2022 1339   MONOABS 0.7 03/28/2012 0556   EOSABS 0.1 10/23/2022 1339   EOSABS 0.0 03/28/2012 0556   BASOSABS 0.0 10/23/2022 1339   BASOSABS 0.0 03/28/2012 0556   CMP     Component Value Date/Time   NA 136 10/23/2022 1339   NA 136 10/05/2014 1520   K 3.7 10/23/2022 1339   K 4.1 10/05/2014 1520   CL 100 10/23/2022 1339   CL 102 10/05/2014 1520   CO2 27 10/23/2022 1339   CO2 31 10/05/2014 1520   GLUCOSE 161 (H) 10/23/2022 1339   GLUCOSE 128 (H) 10/05/2014 1520   BUN 13 10/23/2022 1339   BUN 11 10/05/2014 1520   CREATININE 0.55 10/23/2022 1339   CREATININE 0.73 09/30/2018 1548   CALCIUM 9.2 10/23/2022 1339   CALCIUM 9.2 10/05/2014 1520   PROT 6.9 10/23/2022 1339   PROT 7.1 02/25/2014 1939   ALBUMIN 4.0 10/23/2022 1339   ALBUMIN 3.7 02/25/2014 1939   AST 25 10/23/2022 1339   AST 21 02/25/2014 1939   ALT 18 10/23/2022 1339   ALT 27 02/25/2014 1939   ALKPHOS 57 10/23/2022 1339   ALKPHOS 69 02/25/2014 1939   BILITOT 0.8 10/23/2022 1339   BILITOT 0.5 02/25/2014 1939   GFRNONAA >60 10/23/2022 1339   GFRNONAA >60 10/05/2014 1520   GFRAA SPECIMEN CONTAMINATED, UNABLE TO PERFORM TEST(S). 05/21/2022 1405   GFRAA >60 10/05/2014 1520   Lipid Panel     Component Value Date/Time   CHOL 174 09/13/2020 0837   TRIG 63.0 09/13/2020 0837   HDL 60.10 09/13/2020 0837   CHOLHDL 3 09/13/2020 0837   VLDL 12.6 09/13/2020 0837   LDLCALC 101 (H) 09/13/2020 0837   LDLDIRECT 116.0 10/30/2015 1018   Imaging I have reviewed the images obtained:  CT-scan of the brain 10/24/22: 1. No evidence of acute intracranial hemorrhage or acute infarct. 2. Suspected 4 mm partially calcified meningioma overlying the left parietal lobe, unchanged from the prior head CT of  05/21/2022. 3. Redemonstrated chronic lacunar infarct within the right basal ganglia/internal capsule. 4. Background mild cerebral white matter chronic small vessel ischemic disease. 5. Generalized parenchymal atrophy.  Assessment: 89 year old female with PMHx of atrial fibrillation on Coumadin, PPM, HTN, left breast cancer s/p radiation and lumpectomy, essential tremor, type 2 diabetes mellitus, CAD, CHF, and IBS who presented to outside ED on 10/23/22 duration of worsening headache and hallucinations.  Patient reports always having a headache that is worse with high blood pressures that is bilateral temporal and at times periorbital with a 6/10 severity.  Over the past 3 to 4 days, patient notes worsening of her baseline headache still rating it as a 6/10 severity but her current headache has woken her from sleep which is atypical.  Patient also states that over the past 3-4 days she has had hallucinations of objects that are not there including dogs, flowers, people, curtains and at times diplopia.  The diplopia is chronic for approximately 1 year.  Patient has seen her eye doctor twice this month without any notable eye abnormalities.  Of note, patient's brother was diagnosed with a brain tumor at age 50 with initial symptoms of hallucinations.  There is no associated confusion, overt signs of infection, leukocytosis, fever, loss of sleep, or other signs of delirium.  At this time, differential diagnosis list is broad including tumor, stroke, seizure, toxic metabolic encephalopathy.  Will obtain MRI brain Monday when able due to PPM,  continue to evaluate for infectious etiology, repeat CT head today, and routine EEG.   Recommendations: - MRI brain Monday due to PPM - Routine EEG - Continue to evaluate for electrolyte derangements or infectious process per EDP/primary team - Repeat CTH today pending MRI brain Monday - Neurology will continue to follow  Pt seen by NP/Neuro and later by MD. Note/plan  to be edited by MD as needed.  Lanae Boast, AGAC-NP [facility] Pager: [phone]

## 2022-10-24 NOTE — H&P (Signed)
History and Physical    Patient: Leah Holmes NWG:[id number] DOB: 10/30/1926 DOA: 10/23/2022 DOS: the patient was seen and examined on 10/24/2022 PCP: Sherlene Shams, MD  Patient coming from: Home - lives with her daughter. Uses walker.    Chief Complaint: headaches and hallucinations   HPI: Leah Holmes is a 89 y.o. female with medical history significant of CAD, T2DM, chronic atrial fib on coumadin, HTN, HLD, GAD and depression, hx of breast cancer, SSS s/p PPM who went to [facility] ED on 5/10 with complaints of headaches and hallucinations. Headaches worse over the past week. Saw an eye doctor and eyes cleared. She came to [facility] for headaches and stroke rule out. She came ER to ER from [place] for MRI. History from her daughter.   She states she has had a lot of problems with her eyes over the last year. She has had issues with diplopia for years and attributed to her cataract. Her left eye hurts and gets dry. Got new glasses and this got worse. Hallucinations are new. She started to have visual hallucinations and seeing things coming up at her.  She got another eye exam and only saw a cataract but no other cause of this. Recommended that she neurology. She made an appointment with her PCP yesterday and was sent to ED. She has a history of intermittent headaches, but they are worse this week.  She had one wake her up and she is having progressive hallucinations. She saw a short man with one arm, yellow circles on the floor, dogs in a field.   She has history of headaches, that she will only take tylenol for. Typically has headaches daily for years. Never has seen neurology. Never has had photophobia, phonophobia or N/V. Pain typically around her temples and sometimes on her frontal area. Headaches can be rated as 7/10 and pulsatile. Described as sharp at times. Seems to be more frequent with her high blood pressure. Usually go away with tylenol.   Headache this week described  as the worst of her life this week and woke her up at night. Tylenol did not help this time. Her headache this week was on Thursday. No focal deficits, vision changes or slurred speech. Just the continued hallucinations. She has no headache now.    Denies any fever/chills, chest pain or palpitations, shortness of breath or cough, abdominal pain, N/V/D, dysuria or leg swelling. Her appetite is stable, but eats like a bird.   Denies any AH, having VH now seeing a man in the field out of the window of the door.   She does not smoke or drink alcohol.   ER Course:  vitals: afebrile, bp: 176/76, HR: 64, RR: 18, oxygen: 95%RA Pertinent labs: INR: 2.0, UA pending CT head: no acute finding. Suspected 4mm partially calcified meningioma overlying the left parietal lobe, unchanged from head CT 05/2022. Redemonstrated chronic lacunar infarct within the right basal ganglia/internal capsule. Background mild cerebral white matter chronic small vessel ischemic disease. Generalized parenchymal atrophy. CXR: trace pulmonary edema, small right pleural effusion. Stable cardiomegaly  In ED: neurology consulted. EEG ordered. Given home BP meds of atenolol and lisinopril TRH asked to admit.    Review of Systems: As mentioned in the history of present illness. All other systems reviewed and are negative. Past Medical History:  Diagnosis Date   Atrial fibrillation (HCC) 2012   Breast cancer (HCC) 04/2018   IDC of left breast    CHF (congestive heart failure) (HCC)  Colon polyp 2003   Compression fracture of L2 lumbar vertebra (HCC)    Coronary artery disease    COVID-19 virus infection 12/25/2019   Diabetes mellitus without complication (HCC)    Dyspnea    Dysrhythmia    atrial fib   Environmental and seasonal allergies    GERD (gastroesophageal reflux disease)    Hyperlipidemia    Hypertension    Hypertensive urgency 03/10/2021   Irritable bowel syndrome    Multiple rib fractures 03/15/2018   Personal  history of radiation therapy    Polyp of colon    PONV (postoperative nausea and vomiting)    with ether, not recently "woke up in surgery once"   Presence of permanent cardiac pacemaker    Tremor    Tremors of nervous system    Ulcer    Past Surgical History:  Procedure Laterality Date   ABDOMINAL ADHESION SURGERY     ABDOMINAL HYSTERECTOMY     APPENDECTOMY     BREAST BIOPSY Right late 80s/early 90s   benign   BREAST BIOPSY Left 03/31/2018   [place] bx done with Dr. Lemar Livings, invasive ductal carcinoma   BREAST LUMPECTOMY Left 04/22/2018   Procedure: BREAST LUMPECTOMY;  Surgeon: Earline Mayotte, MD;  Location: [facility] ORS;  Service: General;  Laterality: Left;   BREAST LUMPECTOMY Left 04/22/2018   [facility], negative LN, clear margins   BREAST LUMPECTOMY Left 2020   positive, done in office   BREAST SURGERY Right    biopsy    CESAREAN SECTION     x 2   CHOLECYSTECTOMY  03/2012   Dr Excell Seltzer   COLONOSCOPY  2003   Dr Fritzi Mandes in [place]   fibroid tumor     PACEMAKER INSERTION N/A 01/28/2016   Procedure: INSERTION PACEMAKER;  Surgeon: Marcina Millard, MD;  Location: [facility] ORS;  Service: Cardiovascular;  Laterality: N/A;   SENTINEL NODE BIOPSY Left 04/22/2018   Procedure: SENTINEL NODE BIOPSY;  Surgeon: Earline Mayotte, MD;  Location: [facility] ORS;  Service: General;  Laterality: Left;   UPPER GI ENDOSCOPY  2003   Dr Fritzi Mandes in [place]   Social History:  reports that she has never smoked. She has quit using smokeless tobacco.  Her smokeless tobacco use included snuff. She reports that she does not drink alcohol and does not use drugs.  Allergies  Allergen Reactions   Codeine Anaphylaxis   Penicillins Anaphylaxis   Atorvastatin Other (See Comments)    Unknown   Clonidine Derivatives Other (See Comments)    Unknown    Gabapentin Nausea Only   Losartan Other (See Comments)    Muscle aches     Morphine Other (See Comments)    Unknown   Morphine And Related Other (See Comments)     Unknown    Reglan [Metoclopramide] Other (See Comments)    Tremors    Valtrex [Valacyclovir] Other (See Comments)    Tremors    Amlodipine Other (See Comments)    Diffuse itching    Hydralazine Anxiety   Spironolactone Itching    Family History  Problem Relation Age of Onset   Breast cancer Sister    Cancer Sister 75       breast   Breast cancer Maternal Aunt 8   Breast cancer Cousin    Breast cancer Other 59    Prior to Admission medications   Medication Sig Start Date End Date Taking? Authorizing Provider  acetaminophen (TYLENOL) 325 MG tablet Take 2 tablets (650 mg total) by mouth  every 6 (six) hours as needed. 03/14/21   Alford Highland, MD  amLODipine (NORVASC) 10 MG tablet Take 1 tablet (10 mg total) by mouth daily. Patient taking differently: Take 10 mg by mouth daily as needed (high blood pressure). 09/07/22   Sherlene Shams, MD  atenolol (TENORMIN) 25 MG tablet Take 25 mg by mouth 2 (two) times daily. 07/01/21   [provider]  Cholecalciferol (VITAMIN D3) 1000 UNITS CAPS Take 1,000 Units by mouth every morning.     [provider]  docusate sodium (COLACE) 100 MG capsule Take 2 capsules (200 mg total) by mouth 2 (two) times daily. 03/14/21   Wieting, Richard, MD  glucose blood (ONE TOUCH ULTRA TEST) test strip USE 1 STRIP TO CHECK GLUCOSE TWICE DAILY  Dx code: E11.9 05/29/19   Sherlene Shams, MD  glucose blood (ONE TOUCH ULTRA TEST) test strip USE ONE STRIP TO CHECK GLUCOSE ONCE  DAILY 06/02/19   Sherlene Shams, MD  glucose blood test strip Use to check blood sugars once daily. 09/01/21   Sherlene Shams, MD  hydrochlorothiazide (HYDRODIURIL) 25 MG tablet Take 25 mg by mouth every other day.    [provider]  isosorbide mononitrate (IMDUR) 60 MG 24 hr tablet Take 60 mg by mouth 2 (two) times daily.    [provider]  Lancets [facility] DELICA PLUS [place]) MISC Place 2 Devices inside cheek daily. DX Code E11.9. 09/01/21   Sherlene Shams, MD  lisinopril (ZESTRIL) 40 MG tablet Take 1 tablet (40 mg total) by mouth 2 (two) times daily. 09/07/22   Sherlene Shams, MD  meclizine (ANTIVERT) 25 MG tablet Take 1 tablet by mouth three times daily as needed 07/01/22   Sherlene Shams, MD  neomycin-polymyxin b-dexamethasone (MAXITROL) 3.5-10000-0.1 SUSP Place 1 drop into both eyes 2 (two) times daily. 10/22/22 11/01/22  [provider]  nitroGLYCERIN (NITROSTAT) 0.4 MG SL tablet Place 0.4 mg under the tongue every 5 (five) minutes as needed for chest pain.    [provider]  [facility] ULTRA test strip USE 1 STRIP TO CHECK GLUCOSE TWICE DAILY 05/15/19   Sherlene Shams, MD  Polyethyl Glycol-Propyl Glycol 0.4-0.3 % SOLN Place 1 drop into both eyes 3 (three) times daily as needed (for dry/irritated eyes.).    [provider]  psyllium (METAMUCIL) 58.6 % packet Take 1 packet by mouth daily as needed (for regularity).     [provider]  warfarin (COUMADIN) 2 MG tablet Take 2 mg by mouth See admin instructions. Take 1 tablet (2mg ) by mouth every Sunday and Wednesday night    [provider]  warfarin (COUMADIN) 4 MG tablet Take 4 mg by mouth See admin instructions. Take 2 tablets (4mg ) by mouth every Monday, Tuesday, Thursday, Friday and Saturday night    [provider]    Physical Exam: Vitals:   10/24/22 0700 10/24/22 1059 10/24/22 1321 10/24/22 1326  BP: (!) 155/68  (!) 190/90   Pulse: 60  60   Resp: 14  17   Temp:  98.2 F (36.8 C) 97.9 F (36.6 C)   TempSrc:  Oral Oral   SpO2: 99%  100% 100%  Weight:      Height:       General:  Appears calm and comfortable and is in NAD. Resting tremor of her head  Eyes:  PERRL, EOMI, normal lids, iris ENT:  grossly normal hearing, lips & tongue, mmm; appropriate dentition Neck:  no  LAD, masses or thyromegaly; no carotid bruits Cardiovascular:  RRR, +systolic murmur  No LE edema.  Respiratory:   CTA bilaterally with no  wheezes/rales/rhonchi.  Normal respiratory effort. Abdomen:  soft, NT, ND, NABS Back:   normal alignment, no CVAT Skin:  no rash or induration seen on limited exam Musculoskeletal:  grossly normal tone BUE/BLE, good ROM, no bony abnormality Lower extremity:  No LE edema.  Limited foot exam with no ulcerations.  2+ distal pulses. Psychiatric:  grossly normal mood and affect, speech fluent and appropriate, AOx3 Neurologic:  CN 2-12 grossly intact does have tongue deviation to the right, mild, moves all extremities in coordinated fashion, sensation intact, DTR2+, HTK intact bilaterally, FTN intact bilaterally, negative pronator drift. +intentional tremor    Radiological Exams on Admission: Independently reviewed - see discussion in A/P where applicable  CT HEAD WO CONTRAST ( )  Result Date: 10/24/2022 CLINICAL DATA:  Headache EXAM: CT HEAD WITHOUT CONTRAST TECHNIQUE: Contiguous axial images were obtained from the base of the skull through the vertex without intravenous contrast. RADIATION DOSE REDUCTION: This exam was performed according to the departmental dose-optimization program which includes automated exposure control, adjustment of the mA and/or kV according to patient size and/or use of iterative reconstruction technique. COMPARISON:  Yesterday FINDINGS: Brain: No evidence of acute infarction, hemorrhage, hydrocephalus, extra-axial collection or mass lesion/mass effect. Generalized atrophy. Chronic small vessel ischemia in the cerebral white matter. Vascular: No hyperdense vessel or unexpected calcification. Skull: Normal. Negative for fracture or focal lesion. Sinuses/Orbits: No acute finding. IMPRESSION: No acute or interval finding Electronically Signed   By: Tiburcio Pea M.D.   On: 10/24/2022 10:27   DG Chest Portable 1 View  Result Date: 10/24/2022 CLINICAL DATA:  Altered mental status EXAM: PORTABLE CHEST 1 VIEW COMPARISON:  04/08/2022 FINDINGS: The lungs are symmetrically well  inflated. Small right pleural effusion is present. Trace diffuse interstitial pulmonary infiltrate is present, progressive since prior examination in keeping with probable trace pulmonary edema. No pneumothorax. Mild cardiomegaly is stable. Left subclavian single lead pacemaker is unchanged. No acute bone abnormality. Radiopacities overlying the right neck base and right axilla likely overlie the patient. IMPRESSION: 1. Trace pulmonary edema and small right pleural effusion. 2. Stable cardiomegaly. Electronically Signed   By: Helyn Numbers M.D.   On: 10/24/2022 09:24   CT Head Wo Contrast  Result Date: 10/23/2022 CLINICAL DATA:  Provided history: Headache, increasing frequency or severity. EXAM: CT HEAD WITHOUT CONTRAST TECHNIQUE: Contiguous axial images were obtained from the base of the skull through the vertex without intravenous contrast. RADIATION DOSE REDUCTION: This exam was performed according to the departmental dose-optimization program which includes automated exposure control, adjustment of the mA and/or kV according to patient size and/or use of iterative reconstruction technique. COMPARISON:  Head CT 05/21/2022. FINDINGS: Brain: Generalized parenchymal atrophy. Suspected 4 mm partially calcified meningioma overlying the left parietal lobe, unchanged from the prior head CT 05/21/2022. Redemonstrated chronic lacunar infarct within the right basal ganglia/internal capsule. Background mild patchy and ill-defined hypoattenuation within the cerebral white matter, nonspecific but compatible with chronic small vessel ischemic disease. There is no acute intracranial hemorrhage. No demarcated cortical infarct. No extra-axial fluid collection. No midline shift. Vascular: No hyperdense vessel.  Atherosclerotic calcifications. Skull: No fracture or aggressive osseous lesion. Sinuses/Orbits: No mass or acute finding within the imaged orbits. No significant paranasal sinus disease at the imaged levels.  IMPRESSION: 1. No evidence of acute intracranial hemorrhage or acute infarct. 2. Suspected 4 mm partially calcified meningioma  overlying the left parietal lobe, unchanged from the prior head CT of 05/21/2022. 3. Redemonstrated chronic lacunar infarct within the right basal ganglia/internal capsule. 4. Background mild cerebral white matter chronic small vessel ischemic disease. 5. Generalized parenchymal atrophy. Electronically Signed   By: Jackey Loge D.O.   On: 10/23/2022 14:45    EKG: pending   Labs on Admission: I have personally reviewed the available labs and imaging studies at the time of the admission.  Pertinent labs:   INR: 2.0,  UA clean   Assessment and Plan: Principal Problem:   Intractable headache Active Problems:   Hallucination, visual   Essential hypertension, benign   Other malaise   CAD (coronary artery disease)   DM type 2 with diabetic peripheral neuropathy (HCC)   Chronic atrial fibrillation (HCC)   Sick sinus syndrome (HCC)   Hyperlipidemia associated with type 2 diabetes mellitus (HCC)   Aortic stenosis    Assessment and Plan: * Intractable headache 89 year old presenting with acute, intractable headache described as worse of her life that woke her up with worsening visual hallucinations -obs to tele  -neurology consulted  -she states her headache is gone  -CT head x2 wnl -plan for MRI on Monday with PPM  -frequent neuro checks, does have mild right tongue deviation, but no other acute focal deficits -failed her swallow screen: SLP eval pending  -EEG per neurology  -tylenol PRN for now if needed, may need something more if headache returns  -known carotid artery stenosis-check carotid US   Hallucination, visual Only having visual hallucinations that persist even when she has no headache -head CT with no acute finding, repeat stable -UA clear/CXR clear. No infectious source at this time.  -will check TSH/B12 -she has a resting tremor +intentional  tremor and very strong family history of PD. Daughter states recent shuffling gait. Worth investigating with neurology since hallucinations common with PD -cognitively sharp, not dementia related  -if no organic cause, consider psychiatry consult -delirium precautions   Essential hypertension, benign Quite elevated blood pressure ? Urgency when she presented to ED, but no longer has a headache Start back her home medication: atenolol 25mg  BID, imdur 60mg  BID, hctz 25mg  every other day and lisonopril 40mg  BID X1 dose of norvasc 10mg  now  Will do IV lopressor Prn at low dose if HR allows, allergy to hydralazine   Other malaise PT to see Check B12/TSH   CAD (coronary artery disease) Stable, no chest pain diffuse three-vessel CAD by cath 2012 with critical mid LAD stenosis not amenable to PC  Has declined continued testing as she would not pursue any intervention per cardiology OV note  Continue medical management   DM type 2 with diabetic peripheral neuropathy (HCC) A1C of 6.9 in 08/2022 Diet controlled Continue SSI and accuchecks qac/hs   Chronic atrial fibrillation (HCC) Continue warfarin per pharmacy and atenolol BID   Sick sinus syndrome [facility]) S/p pacemaker insertion in 2017: Patient's last pacemaker interrogation on 03/2022 showed an adequate battery life of 5.5 years.   Hyperlipidemia associated with type 2 diabetes mellitus (HCC) Allergy to atorvastatin Not on statin medication   Aortic stenosis Repeat echo     Advance Care Planning:   Code Status: DNR   Consults: neurology   DVT Prophylaxis: coumadin per pharmacy   Family Communication: daughter at bedside    Severity of Illness: The appropriate patient status for this patient is OBSERVATION. Observation status is judged to be reasonable and necessary in order to provide  the required intensity of service to ensure the patient's safety. The patient's presenting symptoms, physical exam findings, and initial  radiographic and laboratory data in the context of their medical condition is felt to place them at decreased risk for further clinical deterioration. Furthermore, it is anticipated that the patient will be medically stable for discharge from the hospital within 2 midnights of admission.   Author: Orland Mustard, MD 10/24/2022 2:05 PM  For on call review [email or web address].

## 2022-10-24 NOTE — ED Notes (Signed)
ED TO INPATIENT HANDOFF REPORT  ED Nurse Name and Phone #: [phone]  S Name/Age/Gender Leah Holmes 89 y.o. female Room/Bed: 039C/039C  Code Status   Code Status: DNR  Home/SNF/Other Home Patient oriented to: self, place, time, and situation Is this baseline? Yes   Triage Complete: Triage complete  Chief Complaint Intractable headache [R51.9]  Triage Note Pt sent from [place] for an MRI. Pt has been having headaches and started "seeing things" for a couple days. Pt is AOx4 and daughter at bedside.   Allergies Allergies  Allergen Reactions   Codeine Anaphylaxis   Penicillins Anaphylaxis   Atorvastatin Other (See Comments)    Unknown   Clonidine Derivatives Other (See Comments)    Unknown    Gabapentin Nausea Only   Losartan Other (See Comments)    Muscle aches     Morphine Other (See Comments)    Unknown   Morphine And Related Other (See Comments)    Unknown    Reglan [Metoclopramide] Other (See Comments)    Tremors    Valtrex [Valacyclovir] Other (See Comments)    Tremors    Amlodipine Other (See Comments)    Diffuse itching    Hydralazine Anxiety   Spironolactone Itching    Level of Care/Admitting Diagnosis ED Disposition     ED Disposition  Admit   Condition  --   Comment  Hospital Area: [facility] [[id number]]  Level of Care: Telemetry Medical [104]  May place patient in observation at [facility] or [facility] if equivalent level of care is available:: No  Covid Evaluation: Asymptomatic - no recent exposure (last 10 days) testing not required  Diagnosis: Intractable headache [720111]  Admitting Physician: Orland Mustard [[id number]]  Attending Physician: Alton Revere          B Medical/Surgery History Past Medical History:  Diagnosis Date   Atrial fibrillation (HCC) 2012   Breast cancer (HCC) 04/2018   IDC of left breast    CHF (congestive heart failure) (HCC)    Colon polyp 2003    Compression fracture of L2 lumbar vertebra (HCC)    Coronary artery disease    COVID-19 virus infection 12/25/2019   Diabetes mellitus without complication (HCC)    Dyspnea    Dysrhythmia    atrial fib   Environmental and seasonal allergies    GERD (gastroesophageal reflux disease)    Hyperlipidemia    Hypertension    Hypertensive urgency 03/10/2021   Irritable bowel syndrome    Multiple rib fractures 03/15/2018   Personal history of radiation therapy    Polyp of colon    PONV (postoperative nausea and vomiting)    with ether, not recently "woke up in surgery once"   Presence of permanent cardiac pacemaker    Tremor    Tremors of nervous system    Ulcer    Past Surgical History:  Procedure Laterality Date   ABDOMINAL ADHESION SURGERY     ABDOMINAL HYSTERECTOMY     APPENDECTOMY     BREAST BIOPSY Right late 80s/early 90s   benign   BREAST BIOPSY Left 03/31/2018   [place] bx done with Dr. Lemar Livings, invasive ductal carcinoma   BREAST LUMPECTOMY Left 04/22/2018   Procedure: BREAST LUMPECTOMY;  Surgeon: Earline Mayotte, MD;  Location: [facility] ORS;  Service: General;  Laterality: Left;   BREAST LUMPECTOMY Left 04/22/2018   [facility], negative LN, clear margins   BREAST LUMPECTOMY Left 2020   positive, done in office   BREAST  SURGERY Right    biopsy    CESAREAN SECTION     x 2   CHOLECYSTECTOMY  03/2012   Dr Excell Seltzer   COLONOSCOPY  2003   Dr Fritzi Mandes in [place]   fibroid tumor     PACEMAKER INSERTION N/A 01/28/2016   Procedure: INSERTION PACEMAKER;  Surgeon: Marcina Millard, MD;  Location: [facility] ORS;  Service: Cardiovascular;  Laterality: N/A;   SENTINEL NODE BIOPSY Left 04/22/2018   Procedure: SENTINEL NODE BIOPSY;  Surgeon: Earline Mayotte, MD;  Location: [facility] ORS;  Service: General;  Laterality: Left;   UPPER GI ENDOSCOPY  2003   Dr Fritzi Mandes in [place]     A IV Location/Drains/Wounds Patient Lines/Drains/Airways Status     Active Line/Drains/Airways     Name Placement  date Placement time Site Days   Peripheral IV 10/24/22 20 G 1" Left;Anterior Forearm 10/24/22  1302  Forearm  less than 1            Intake/Output Last 24 hours No intake or output data in the 24 hours ending 10/24/22 1336  Labs/Imaging Results for orders placed or performed during the hospital encounter of 10/23/22 (from the past 48 hour(s))  Urinalysis, Routine w reflex microscopic -Urine, Clean Catch     Status: Abnormal   Collection Time: 10/24/22 12:15 PM  Result Value Ref Range   Color, Urine YELLOW YELLOW   APPearance CLEAR CLEAR   Specific Gravity, Urine 1.014 1.005 - 1.030   pH 6.0 5.0 - 8.0   Glucose, UA NEGATIVE NEGATIVE mg/dL   Hgb urine dipstick NEGATIVE NEGATIVE   Bilirubin Urine NEGATIVE NEGATIVE   Ketones, ur NEGATIVE NEGATIVE mg/dL   Protein, ur NEGATIVE NEGATIVE mg/dL   Nitrite NEGATIVE NEGATIVE   Leukocytes,Ua NEGATIVE NEGATIVE   RBC / HPF 0-5 0 - 5 RBC/hpf   WBC, UA 0-5 0 - 5 WBC/hpf   Bacteria, UA RARE (A) NONE SEEN   Squamous Epithelial / HPF 0-5 0 - 5 /HPF   Mucus PRESENT     Comment: Performed at [facility], [address], [place], [place] [address]  Protime-INR     Status: Abnormal   Collection Time: 10/24/22  1:00 PM  Result Value Ref Range   Prothrombin Time 22.7 (H) 11.4 - 15.2 seconds   INR 2.0 (H) 0.8 - 1.2    Comment: (NOTE) INR goal varies based on device and disease states. Performed at [facility], [address] [place], [place] [address]    CT HEAD WO CONTRAST ( )  Result Date: 10/24/2022 CLINICAL DATA:  Headache EXAM: CT HEAD WITHOUT CONTRAST TECHNIQUE: Contiguous axial images were obtained from the base of the skull through the vertex without intravenous contrast. RADIATION DOSE REDUCTION: This exam was performed according to the departmental dose-optimization program which includes automated exposure control, adjustment of the mA and/or kV according to patient size and/or use of iterative reconstruction  technique. COMPARISON:  Yesterday FINDINGS: Brain: No evidence of acute infarction, hemorrhage, hydrocephalus, extra-axial collection or mass lesion/mass effect. Generalized atrophy. Chronic small vessel ischemia in the cerebral white matter. Vascular: No hyperdense vessel or unexpected calcification. Skull: Normal. Negative for fracture or focal lesion. Sinuses/Orbits: No acute finding. IMPRESSION: No acute or interval finding Electronically Signed   By: Tiburcio Pea M.D.   On: 10/24/2022 10:27   DG Chest Portable 1 View  Result Date: 10/24/2022 CLINICAL DATA:  Altered mental status EXAM: PORTABLE CHEST 1 VIEW COMPARISON:  04/08/2022 FINDINGS: The lungs are symmetrically  well inflated. Small right pleural effusion is present. Trace diffuse interstitial pulmonary infiltrate is present, progressive since prior examination in keeping with probable trace pulmonary edema. No pneumothorax. Mild cardiomegaly is stable. Left subclavian single lead pacemaker is unchanged. No acute bone abnormality. Radiopacities overlying the right neck base and right axilla likely overlie the patient. IMPRESSION: 1. Trace pulmonary edema and small right pleural effusion. 2. Stable cardiomegaly. Electronically Signed   By: Helyn Numbers M.D.   On: 10/24/2022 09:24   CT Head Wo Contrast  Result Date: 10/23/2022 CLINICAL DATA:  Provided history: Headache, increasing frequency or severity. EXAM: CT HEAD WITHOUT CONTRAST TECHNIQUE: Contiguous axial images were obtained from the base of the skull through the vertex without intravenous contrast. RADIATION DOSE REDUCTION: This exam was performed according to the departmental dose-optimization program which includes automated exposure control, adjustment of the mA and/or kV according to patient size and/or use of iterative reconstruction technique. COMPARISON:  Head CT 05/21/2022. FINDINGS: Brain: Generalized parenchymal atrophy. Suspected 4 mm partially calcified meningioma overlying  the left parietal lobe, unchanged from the prior head CT 05/21/2022. Redemonstrated chronic lacunar infarct within the right basal ganglia/internal capsule. Background mild patchy and ill-defined hypoattenuation within the cerebral white matter, nonspecific but compatible with chronic small vessel ischemic disease. There is no acute intracranial hemorrhage. No demarcated cortical infarct. No extra-axial fluid collection. No midline shift. Vascular: No hyperdense vessel.  Atherosclerotic calcifications. Skull: No fracture or aggressive osseous lesion. Sinuses/Orbits: No mass or acute finding within the imaged orbits. No significant paranasal sinus disease at the imaged levels. IMPRESSION: 1. No evidence of acute intracranial hemorrhage or acute infarct. 2. Suspected 4 mm partially calcified meningioma overlying the left parietal lobe, unchanged from the prior head CT of 05/21/2022. 3. Redemonstrated chronic lacunar infarct within the right basal ganglia/internal capsule. 4. Background mild cerebral white matter chronic small vessel ischemic disease. 5. Generalized parenchymal atrophy. Electronically Signed   By: Jackey Loge D.O.   On: 10/23/2022 14:45    Pending Labs Unresulted Labs (From admission, onward)     Start     Ordered   10/25/22 0500  Protime-INR  Daily,   R      10/24/22 1138   10/25/22 0500  Basic metabolic panel  Tomorrow morning,   R        10/24/22 1217   10/25/22 0500  CBC  Tomorrow morning,   R        10/24/22 1217   10/24/22 1218  Vitamin B12  Once,   R        10/24/22 1217   10/24/22 1217  TSH  Once,   R        10/24/22 1217            Vitals/Pain Today's Vitals   10/24/22 1320 10/24/22 1321 10/24/22 1321 10/24/22 1326  BP:   (!) 190/90   Pulse:   60   Resp:   17   Temp:   97.9 F (36.6 C)   TempSrc:   Oral   SpO2:   100% 100%  Weight:      Height:      PainSc: 0-No pain 0-No pain 0-No pain     Isolation Precautions No active  isolations  Medications Medications  atenolol (TENORMIN) tablet 25 mg (has no administration in time range)  hydrochlorothiazide (HYDRODIURIL) tablet 25 mg (has no administration in time range)  isosorbide mononitrate (IMDUR) 24 hr tablet 60 mg (has no administration in time range)  lisinopril (ZESTRIL) tablet 40 mg (has no administration in time range)  amLODipine (NORVASC) tablet 10 mg (has no administration in time range)  Warfarin - Pharmacist Dosing Inpatient (has no administration in time range)  acetaminophen (TYLENOL) tablet 650 mg (has no administration in time range)    Or  acetaminophen (TYLENOL) suppository 650 mg (has no administration in time range)  warfarin (COUMADIN) tablet 4 mg (has no administration in time range)  atenolol (TENORMIN) tablet 25 mg (25 mg Oral Given 10/24/22 0140)  lisinopril (ZESTRIL) tablet 40 mg (40 mg Oral Given 10/24/22 0140)  isosorbide mononitrate (IMDUR) 24 hr tablet 60 mg (60 mg Oral Given 10/24/22 0357)    Mobility walks with device     Focused Assessments Neuro Assessment Handoff:  Swallow screen pass? Yes          Neuro Assessment:   Neuro Checks:      Has TPA been given? No If patient is a Neuro Trauma and patient is going to OR before floor call report to 4N Charge nurse: [phone] or [phone]   R Recommendations: See Admitting Provider Note  Report given to:   Additional Notes: He BP is running 182/73 and 190/90. MD is aware she is getting a onetime dose of Norvasc. Her schedule HCTZ and he Imdur. She  have not had her schedule blood pressure medication, they just for approved by pharmacy.

## 2022-10-24 NOTE — Progress Notes (Cosign Needed Addendum)
EEG complete - results pending 

## 2022-10-24 NOTE — ED Notes (Signed)
ED TO INPATIENT HANDOFF REPORT  ED Nurse Name and Phone  Lew Dawes RN [phone]  S Name/Age/Gender Leah Holmes 89 y.o. female Room/Bed: 017C/017C  Code Status   Code Status: Prior  Home/SNF/Other Home Patient oriented to: self, place, time, and situation Is this baseline? Yes   Triage Complete: Triage complete  Chief Complaint Intractable headache [R51.9]  Triage Note Pt sent from [place] for an MRI. Pt has been having headaches and started "seeing things" for a couple days. Pt is AOx4 and daughter at bedside.   Allergies Allergies  Allergen Reactions   Codeine Anaphylaxis   Penicillins Anaphylaxis    Has patient had a PCN reaction causing immediate rash, facial/tongue/throat swelling, SOB or lightheadedness with hypotension: Yes Has patient had a PCN reaction causing severe rash involving mucus membranes or skin necrosis: No Has patient had a PCN reaction that required hospitalization Yes Has patient had a PCN reaction occurring within the last 10 years: No If all of the above answers are "NO", then may proceed with Cephalosporin use.   Atorvastatin     Other reaction(s): Unknown   Clonidine Derivatives Other (See Comments)    Reaction:  Unknown    Gabapentin Nausea Only   Losartan Other (See Comments)    Reaction:  Muscle aches     Morphine     Other reaction(s): Unknown   Morphine And Related Other (See Comments)    Reaction:  Unknown    Reglan [Metoclopramide] Other (See Comments)    Reaction:  Tremors    Valtrex [Valacyclovir] Other (See Comments)    Tremors    Amlodipine Other (See Comments)    Diffuse itching    Hydralazine Anxiety   Spironolactone Itching    Level of Care/Admitting Diagnosis ED Disposition     ED Disposition  Admit   Condition  --   Comment  Hospital Area: [facility] [[id number]]  Level of Care: Telemetry Medical [104]  May place patient in observation at [facility] or [facility] if equivalent  level of care is available:: No  Covid Evaluation: Asymptomatic - no recent exposure (last 10 days) testing not required  Diagnosis: Intractable headache [720111]  Admitting Physician: Orland Mustard [[id number]]  Attending Physician: Alton Revere          B Medical/Surgery History Past Medical History:  Diagnosis Date   Atrial fibrillation (HCC) 2012   Breast cancer (HCC) 04/2018   IDC of left breast    CHF (congestive heart failure) (HCC)    Colon polyp 2003   Compression fracture of L2 lumbar vertebra (HCC)    Coronary artery disease    COVID-19 virus infection 12/25/2019   Diabetes mellitus without complication (HCC)    Dyspnea    Dysrhythmia    atrial fib   Environmental and seasonal allergies    GERD (gastroesophageal reflux disease)    Hyperlipidemia    Hypertension    Hypertensive urgency 03/10/2021   Irritable bowel syndrome    Multiple rib fractures 03/15/2018   Personal history of radiation therapy    Polyp of colon    PONV (postoperative nausea and vomiting)    with ether, not recently "woke up in surgery once"   Presence of permanent cardiac pacemaker    Tremor    Tremors of nervous system    Ulcer    Past Surgical History:  Procedure Laterality Date   ABDOMINAL ADHESION SURGERY     ABDOMINAL HYSTERECTOMY     APPENDECTOMY  BREAST BIOPSY Right late 80s/early 90s   benign   BREAST BIOPSY Left 03/31/2018   [place] bx done with Dr. Lemar Livings, invasive ductal carcinoma   BREAST LUMPECTOMY Left 04/22/2018   Procedure: BREAST LUMPECTOMY;  Surgeon: Earline Mayotte, MD;  Location: [facility] ORS;  Service: General;  Laterality: Left;   BREAST LUMPECTOMY Left 04/22/2018   [facility], negative LN, clear margins   BREAST LUMPECTOMY Left 2020   positive, done in office   BREAST SURGERY Right    biopsy    CESAREAN SECTION     x 2   CHOLECYSTECTOMY  03/2012   Dr Excell Seltzer   COLONOSCOPY  2003   Dr Fritzi Mandes in [place]   fibroid tumor     PACEMAKER INSERTION N/A  01/28/2016   Procedure: INSERTION PACEMAKER;  Surgeon: Marcina Millard, MD;  Location: [facility] ORS;  Service: Cardiovascular;  Laterality: N/A;   SENTINEL NODE BIOPSY Left 04/22/2018   Procedure: SENTINEL NODE BIOPSY;  Surgeon: Earline Mayotte, MD;  Location: [facility] ORS;  Service: General;  Laterality: Left;   UPPER GI ENDOSCOPY  2003   Dr Fritzi Mandes in [place]     A IV Location/Drains/Wounds Patient Lines/Drains/Airways Status     Active Line/Drains/Airways     None            Intake/Output Last 24 hours No intake or output data in the 24 hours ending 10/24/22 1122  Labs/Imaging Results for orders placed or performed during the hospital encounter of 10/23/22 (from the past 48 hour(s))  CBC with Differential     Status: None   Collection Time: 10/23/22  1:39 PM  Result Value Ref Range   WBC 5.0 4.0 - 10.5 K/uL   RBC 4.36 3.87 - 5.11 MIL/uL   Hemoglobin 13.8 12.0 - 15.0 g/dL   HCT 98.1 19.1 - 47.8 %   MCV 95.0 80.0 - 100.0 fL   MCH 31.7 26.0 - 34.0 pg   MCHC 33.3 30.0 - 36.0 g/dL   RDW 29.5 62.1 - 30.8 %   Platelets 223 150 - 400 K/uL   nRBC 0.0 0.0 - 0.2 %   Neutrophils Relative % 66 %   Neutro Abs 3.3 1.7 - 7.7 K/uL   Lymphocytes Relative 22 %   Lymphs Abs 1.1 0.7 - 4.0 K/uL   Monocytes Relative 9 %   Monocytes Absolute 0.5 0.1 - 1.0 K/uL   Eosinophils Relative 2 %   Eosinophils Absolute 0.1 0.0 - 0.5 K/uL   Basophils Relative 1 %   Basophils Absolute 0.0 0.0 - 0.1 K/uL   Immature Granulocytes 0 %   Abs Immature Granulocytes 0.01 0.00 - 0.07 K/uL    Comment: Performed at [facility], [address] [place], [place] [address]  Comprehensive metabolic panel     Status: Abnormal   Collection Time: 10/23/22  1:39 PM  Result Value Ref Range   Sodium 136 135 - 145 mmol/L   Potassium 3.7 3.5 - 5.1 mmol/L   Chloride 100 98 - 111 mmol/L   CO2 27 22 - 32 mmol/L   Glucose, Bld 161 (H) 70 - 99 mg/dL    Comment: Glucose reference range applies only to  samples taken after fasting for at least 8 hours.   BUN 13 8 - 23 mg/dL   Creatinine, Ser 6.96 0.44 - 1.00 mg/dL   Calcium 9.2 8.9 - 29.5 mg/dL   Total Protein 6.9 6.5 - 8.1 g/dL   Albumin 4.0 3.5 - 5.0 g/dL  AST 25 15 - 41 U/L   ALT 18 0 - 44 U/L   Alkaline Phosphatase 57 38 - 126 U/L   Total Bilirubin 0.8 0.3 - 1.2 mg/dL   GFR, Estimated >29 >56 mL/min    Comment: (NOTE) Calculated using the CKD-EPI Creatinine Equation (2021)    Anion gap 9 5 - 15    Comment: Performed at [facility], [address] [place], [place] [address]  Protime-INR     Status: Abnormal   Collection Time: 10/23/22  1:39 PM  Result Value Ref Range   Prothrombin Time 22.6 (H) 11.4 - 15.2 seconds   INR 2.0 (H) 0.8 - 1.2    Comment: (NOTE) INR goal varies based on device and disease states. Performed at [facility], [address] [place], [place] [address]   APTT     Status: Abnormal   Collection Time: 10/23/22  1:39 PM  Result Value Ref Range   aPTT 47 (H) 24 - 36 seconds    Comment:        IF BASELINE aPTT IS ELEVATED, SUGGEST PATIENT RISK ASSESSMENT BE USED TO DETERMINE APPROPRIATE ANTICOAGULANT THERAPY. Performed at [facility], [address] [place], [place] [address]    CT HEAD WO CONTRAST ( )  Result Date: 10/24/2022 CLINICAL DATA:  Headache EXAM: CT HEAD WITHOUT CONTRAST TECHNIQUE: Contiguous axial images were obtained from the base of the skull through the vertex without intravenous contrast. RADIATION DOSE REDUCTION: This exam was performed according to the departmental dose-optimization program which includes automated exposure control, adjustment of the mA and/or kV according to patient size and/or use of iterative reconstruction technique. COMPARISON:  Yesterday FINDINGS: Brain: No evidence of acute infarction, hemorrhage, hydrocephalus, extra-axial collection or mass lesion/mass effect. Generalized atrophy. Chronic small vessel ischemia in the cerebral  white matter. Vascular: No hyperdense vessel or unexpected calcification. Skull: Normal. Negative for fracture or focal lesion. Sinuses/Orbits: No acute finding. IMPRESSION: No acute or interval finding Electronically Signed   By: Tiburcio Pea M.D.   On: 10/24/2022 10:27   DG Chest Portable 1 View  Result Date: 10/24/2022 CLINICAL DATA:  Altered mental status EXAM: PORTABLE CHEST 1 VIEW COMPARISON:  04/08/2022 FINDINGS: The lungs are symmetrically well inflated. Small right pleural effusion is present. Trace diffuse interstitial pulmonary infiltrate is present, progressive since prior examination in keeping with probable trace pulmonary edema. No pneumothorax. Mild cardiomegaly is stable. Left subclavian single lead pacemaker is unchanged. No acute bone abnormality. Radiopacities overlying the right neck base and right axilla likely overlie the patient. IMPRESSION: 1. Trace pulmonary edema and small right pleural effusion. 2. Stable cardiomegaly. Electronically Signed   By: Helyn Numbers M.D.   On: 10/24/2022 09:24   CT Head Wo Contrast  Result Date: 10/23/2022 CLINICAL DATA:  Provided history: Headache, increasing frequency or severity. EXAM: CT HEAD WITHOUT CONTRAST TECHNIQUE: Contiguous axial images were obtained from the base of the skull through the vertex without intravenous contrast. RADIATION DOSE REDUCTION: This exam was performed according to the departmental dose-optimization program which includes automated exposure control, adjustment of the mA and/or kV according to patient size and/or use of iterative reconstruction technique. COMPARISON:  Head CT 05/21/2022. FINDINGS: Brain: Generalized parenchymal atrophy. Suspected 4 mm partially calcified meningioma overlying the left parietal lobe, unchanged from the prior head CT 05/21/2022. Redemonstrated chronic lacunar infarct within the right basal ganglia/internal capsule. Background mild patchy and ill-defined hypoattenuation within the  cerebral white matter, nonspecific but compatible with chronic small vessel  ischemic disease. There is no acute intracranial hemorrhage. No demarcated cortical infarct. No extra-axial fluid collection. No midline shift. Vascular: No hyperdense vessel.  Atherosclerotic calcifications. Skull: No fracture or aggressive osseous lesion. Sinuses/Orbits: No mass or acute finding within the imaged orbits. No significant paranasal sinus disease at the imaged levels. IMPRESSION: 1. No evidence of acute intracranial hemorrhage or acute infarct. 2. Suspected 4 mm partially calcified meningioma overlying the left parietal lobe, unchanged from the prior head CT of 05/21/2022. 3. Redemonstrated chronic lacunar infarct within the right basal ganglia/internal capsule. 4. Background mild cerebral white matter chronic small vessel ischemic disease. 5. Generalized parenchymal atrophy. Electronically Signed   By: Jackey Loge D.O.   On: 10/23/2022 14:45    Pending Labs Unresulted Labs (From admission, onward)     Start     Ordered   10/24/22 0754  Urinalysis, Routine w reflex microscopic -Urine, Clean Catch  Once,   URGENT       Question:  Specimen Source  Answer:  Urine, Clean Catch   10/24/22 0753            Vitals/Pain Today's Vitals   10/24/22 0645 10/24/22 0653 10/24/22 0700 10/24/22 1059  BP: (!) 177/79  (!) 155/68   Pulse: 68  60   Resp: 12  14   Temp:  98.4 F (36.9 C)  98.2 F (36.8 C)  TempSrc:  Oral  Oral  SpO2: 100%  99%   Weight:      Height:        Isolation Precautions No active isolations  Medications Medications  atenolol (TENORMIN) tablet 25 mg (has no administration in time range)  hydrochlorothiazide (HYDRODIURIL) tablet 25 mg (has no administration in time range)  isosorbide mononitrate (IMDUR) 24 hr tablet 60 mg (has no administration in time range)  lisinopril (ZESTRIL) tablet 40 mg (has no administration in time range)  amLODipine (NORVASC) tablet 10 mg (has no  administration in time range)  atenolol (TENORMIN) tablet 25 mg (25 mg Oral Given 10/24/22 0140)  lisinopril (ZESTRIL) tablet 40 mg (40 mg Oral Given 10/24/22 0140)  isosorbide mononitrate (IMDUR) 24 hr tablet 60 mg (60 mg Oral Given 10/24/22 0357)    Mobility walks     Focused Assessments Neuro Assessment Handoff:  Swallow screen pass? Yes          Neuro Assessment:   Neuro Checks:      Has TPA been given? No If patient is a Neuro Trauma and patient is going to OR before floor call report to 4N Charge nurse: [phone] or [phone]   R Recommendations: See Admitting Provider Note  Report given to:   Additional Notes: Patient ambulates to [facility] with 1 asst. Family at bedside.

## 2022-10-24 NOTE — Progress Notes (Addendum)
ANTICOAGULATION CONSULT NOTE - Initial Consult  Pharmacy Consult for Warfarin Indication: atrial fibrillation  Allergies  Allergen Reactions   Codeine Anaphylaxis   Penicillins Anaphylaxis    Has patient had a PCN reaction causing immediate rash, facial/tongue/throat swelling, SOB or lightheadedness with hypotension: Yes Has patient had a PCN reaction causing severe rash involving mucus membranes or skin necrosis: No Has patient had a PCN reaction that required hospitalization Yes Has patient had a PCN reaction occurring within the last 10 years: No If all of the above answers are "NO", then may proceed with Cephalosporin use.   Atorvastatin     Other reaction(s): Unknown   Clonidine Derivatives Other (See Comments)    Reaction:  Unknown    Gabapentin Nausea Only   Losartan Other (See Comments)    Reaction:  Muscle aches     Morphine     Other reaction(s): Unknown   Morphine And Related Other (See Comments)    Reaction:  Unknown    Reglan [Metoclopramide] Other (See Comments)    Reaction:  Tremors    Valtrex [Valacyclovir] Other (See Comments)    Tremors    Amlodipine Other (See Comments)    Diffuse itching    Hydralazine Anxiety   Spironolactone Itching    Patient Measurements: Height: 5\' 6"  (167.6 cm) Weight: 54.4 kg (119 lb 14.9 oz) IBW/kg (Calculated) : 59.3  Vital Signs: Temp: 98.2 F (36.8 C) (05/11 1059) Temp Source: Oral (05/11 1059) BP: 155/68 (05/11 0700) Pulse Rate: 60 (05/11 0700)  Labs: Recent Labs    10/23/22 1339  HGB 13.8  HCT 41.4  PLT 223  APTT 47*  LABPROT 22.6*  INR 2.0*  CREATININE 0.55    Estimated Creatinine Clearance: 36.9 mL/min (by C-G formula based on SCr of 0.55 mg/dL).   Medical History: Past Medical History:  Diagnosis Date   Atrial fibrillation (HCC) 2012   Breast cancer (HCC) 04/2018   IDC of left breast    CHF (congestive heart failure) (HCC)    Colon polyp 2003   Compression fracture of L2 lumbar vertebra (HCC)     Coronary artery disease    COVID-19 virus infection 12/25/2019   Diabetes mellitus without complication (HCC)    Dyspnea    Dysrhythmia    atrial fib   Environmental and seasonal allergies    GERD (gastroesophageal reflux disease)    Hyperlipidemia    Hypertension    Hypertensive urgency 03/10/2021   Irritable bowel syndrome    Multiple rib fractures 03/15/2018   Personal history of radiation therapy    Polyp of colon    PONV (postoperative nausea and vomiting)    with ether, not recently "woke up in surgery once"   Presence of permanent cardiac pacemaker    Tremor    Tremors of nervous system    Ulcer     Medications:  (Not in a hospital admission)  Scheduled:   amLODipine  10 mg Oral Once   atenolol  25 mg Oral BID   hydrochlorothiazide  25 mg Oral QODAY   isosorbide mononitrate  60 mg Oral BID   lisinopril  40 mg Oral BID   Infusions:  PRN:   Assessment: 69 yof with a history of AF on warfarin, CAD, HF, HLD, HTN, IBS, essential tremor, PPM, lt breast cancer s/p lumpectomy and radiation, and T2DM. Patient is presenting with headache/hallucinations. Warfarin per pharmacy consult placed for atrial fibrillation.  Patient taking warfarin prior to arrival. Home dose is 2 mg Sun &  Wed and 4 mg all other days. Last taken 5/9 2000.  INR today is 2, which is low-therapeutic PT/INR (5/10) 22.6 / 2  Hgb 13.8; plt 223  Goal of Therapy:  INR Goal 2-3 Monitor platelets by anticoagulation protocol: Yes   Plan:  Will give 4mg  warfarin today. Repeat dosing per INR. Monitor for s/s of hemorrhage, daily INR, CBC Watch for new DDIs  Delmar Landau, PharmD, BCPS 10/24/2022 11:33 AM ED Clinical Pharmacist -  [phone]

## 2022-10-24 NOTE — Procedures (Signed)
Routine EEG Report  Leah Holmes is a 89 y.o. female with a history of hallucinations who is undergoing an EEG to evaluate for seizures.  Report: This EEG was acquired with electrodes placed according to the International 10-20 electrode system (including Fp1, Fp2, F3, F4, C3, C4, P3, P4, O1, O2, T3, T4, T5, T6, A1, A2, Fz, Cz, Pz). The following electrodes were missing or displaced: none.  The occipital dominant rhythm was 8.5 Hz. This activity is reactive to stimulation. Drowsiness was manifested by background fragmentation; deeper stages of sleep were not identified. There was no focal slowing. There were no interictal epileptiform discharges. There were no electrographic seizures identified. There was no abnormal response to photic stimulation or hyperventilation.   Impression: This EEG was obtained while awake and drowsy and is normal.    Clinical Correlation: Normal EEGs, however, do not rule out epilepsy.  Bing Neighbors, MD [facility] [phone]  If 7pm- 7am, please page neurology on call as listed in AMION.

## 2022-10-24 NOTE — Assessment & Plan Note (Signed)
Continue warfarin per pharmacy and atenolol BID

## 2022-10-24 NOTE — Assessment & Plan Note (Signed)
A1C of 6.9 in 08/2022 Diet controlled Continue SSI and accuchecks qac/hs

## 2022-10-24 NOTE — Assessment & Plan Note (Signed)
Repeat echo

## 2022-10-24 NOTE — Assessment & Plan Note (Addendum)
Quite elevated blood pressure ? Urgency when she presented to ED, but no longer has a headache Start back her home medication: atenolol 25mg  BID, imdur 60mg  BID, hctz 25mg  every other day and lisonopril 40mg  BID X1 dose of norvasc 10mg  now  Will do IV lopressor Prn at low dose if HR allows, allergy to hydralazine

## 2022-10-24 NOTE — Evaluation (Signed)
Clinical/Bedside Swallow Evaluation Patient Details  Name: Jenaya Eastmond MRN: [id number] Date of Birth: [birth year]  Today's Date: 10/24/2022 Time: SLP Start Time (ACUTE ONLY): 1301 SLP Stop Time (ACUTE ONLY): 1314 SLP Time Calculation (min) (ACUTE ONLY): 13 min  Past Medical History:  Past Medical History:  Diagnosis Date   Atrial fibrillation (HCC) 2012   Breast cancer (HCC) 04/2018   IDC of left breast    CHF (congestive heart failure) (HCC)    Colon polyp 2003   Compression fracture of L2 lumbar vertebra (HCC)    Coronary artery disease    COVID-19 virus infection 12/25/2019   Diabetes mellitus without complication (HCC)    Dyspnea    Dysrhythmia    atrial fib   Environmental and seasonal allergies    GERD (gastroesophageal reflux disease)    Hyperlipidemia    Hypertension    Hypertensive urgency 03/10/2021   Irritable bowel syndrome    Multiple rib fractures 03/15/2018   Personal history of radiation therapy    Polyp of colon    PONV (postoperative nausea and vomiting)    with ether, not recently "woke up in surgery once"   Presence of permanent cardiac pacemaker    Tremor    Tremors of nervous system    Ulcer    Past Surgical History:  Past Surgical History:  Procedure Laterality Date   ABDOMINAL ADHESION SURGERY     ABDOMINAL HYSTERECTOMY     APPENDECTOMY     BREAST BIOPSY Right late 80s/early 90s   benign   BREAST BIOPSY Left 03/31/2018   [place] bx done with Dr. Lemar Livings, invasive ductal carcinoma   BREAST LUMPECTOMY Left 04/22/2018   Procedure: BREAST LUMPECTOMY;  Surgeon: Earline Mayotte, MD;  Location: [facility] ORS;  Service: General;  Laterality: Left;   BREAST LUMPECTOMY Left 04/22/2018   [facility], negative LN, clear margins   BREAST LUMPECTOMY Left 2020   positive, done in office   BREAST SURGERY Right    biopsy    CESAREAN SECTION     x 2   CHOLECYSTECTOMY  03/2012   Dr Excell Seltzer   COLONOSCOPY  2003   Dr Fritzi Mandes in [place]   fibroid tumor      PACEMAKER INSERTION N/A 01/28/2016   Procedure: INSERTION PACEMAKER;  Surgeon: Marcina Millard, MD;  Location: [facility] ORS;  Service: Cardiovascular;  Laterality: N/A;   SENTINEL NODE BIOPSY Left 04/22/2018   Procedure: SENTINEL NODE BIOPSY;  Surgeon: Earline Mayotte, MD;  Location: [facility] ORS;  Service: General;  Laterality: Left;   UPPER GI ENDOSCOPY  2003   Dr Fritzi Mandes in [place]   HPI:  Kadesia Maxin is a 89 y.o. female presents to the emergency department as a transfer from [facility] emergency department to obtain an MRI to rule out CVA versus press. Patient presented there after being referred by her PCP for having 3 days of headache and hallucinations. Patient was noted to be hypertensive in the emergency department. CT no acute or interval finding. PMH: GERD, HTN, DM, "personal history of radiation therapy", CAD, breast cancer, A-fib.    Assessment / Plan / Recommendation  Clinical Impression  Pt seen for swallow assessment with daughter present who both deny history of or current dysphagia. Pt alert, following commands for normal oromotor exam with upper and lower dentures. Needed some steadying assist with liquids due to tremors. She consumed straw sips thin without indications of airway intrusion. Mastication of solid was timely with complete clearance over several trials. Recommend regular  texture, thin liquids, pills with thin. No follow up needed. SLP Visit Diagnosis: Dysphagia, unspecified (R13.10)    Aspiration Risk  No limitations    Diet Recommendation Regular;Thin liquid   Liquid Administration via: Cup;Straw Medication Administration: Whole meds with liquid Supervision: Patient able to self feed (may need assist due to tremors) Postural Changes: Seated upright at 90 degrees    Other  Recommendations Oral Care Recommendations: Oral care BID    Recommendations for follow up therapy are one component of a multi-disciplinary discharge planning process, led by the  attending physician.  Recommendations may be updated based on patient status, additional functional criteria and insurance authorization.  Follow up Recommendations No SLP follow up      Assistance Recommended at Discharge    Functional Status Assessment Patient has not had a recent decline in their functional status  Frequency and Duration            Prognosis        Swallow Study   General Date of Onset: 10/24/22 HPI: Aashvi Russell is a 89 y.o. female presents to the emergency department as a transfer from [facility] emergency department to obtain an MRI to rule out CVA versus press. Patient presented there after being referred by her PCP for having 3 days of headache and hallucinations. Patient was noted to be hypertensive in the emergency department. CT no acute or interval finding. PMH: GERD, HTN, DM, "personal history of radiation therapy", CAD, breast cancer, A-fib. Type of Study: Bedside Swallow Evaluation Previous Swallow Assessment:  (none) Diet Prior to this Study: Regular;Thin liquids (Level 0) Temperature Spikes Noted: No Respiratory Status: Room air History of Recent Intubation: No Behavior/Cognition: Alert;Cooperative;Pleasant mood Oral Cavity Assessment: Within Functional Limits Oral Care Completed by SLP: No Oral Cavity - Dentition: Dentures, top;Dentures, bottom Vision: Functional for self-feeding Self-Feeding Abilities: Able to feed self (some assist due to tremors) Patient Positioning: Upright in bed Baseline Vocal Quality: Normal Volitional Cough: Strong Volitional Swallow: Able to elicit    Oral/Motor/Sensory Function Overall Oral Motor/Sensory Function: Within functional limits   Ice Chips Ice chips: Not tested   Thin Liquid Thin Liquid: Within functional limits Presentation: Straw    Nectar Thick Nectar Thick Liquid: Not tested   Honey Thick Honey Thick Liquid: Not tested   Puree Puree: Within functional limits   Solid     Solid: Within  functional limits      Royce Macadamia 10/24/2022,1:53 PM

## 2022-10-24 NOTE — Assessment & Plan Note (Addendum)
Stable, no chest pain diffuse three-vessel CAD by cath 2012 with critical mid LAD stenosis not amenable to [facility]  Has declined continued testing as she would not pursue any intervention per cardiology OV note  Continue medical management

## 2022-10-24 NOTE — ED Notes (Signed)
Used bedpan and put the tissue paper in the pan unable to get spec.

## 2022-10-25 ENCOUNTER — Encounter (HOSPITAL_COMMUNITY): Payer: Self-pay | Admitting: Family Medicine

## 2022-10-25 ENCOUNTER — Observation Stay (HOSPITAL_COMMUNITY): Payer: Medicare Other

## 2022-10-25 ENCOUNTER — Encounter (HOSPITAL_COMMUNITY)
Admission: EM | Disposition: A | Discharge status: Skilled Nursing Facility | Payer: Self-pay | Source: Home / Self Care | Attending: Internal Medicine

## 2022-10-25 ENCOUNTER — Observation Stay (HOSPITAL_COMMUNITY): Payer: Medicare Other | Admitting: Anesthesiology

## 2022-10-25 ENCOUNTER — Inpatient Hospital Stay (HOSPITAL_COMMUNITY): Payer: Medicare Other

## 2022-10-25 DIAGNOSIS — I2510 Atherosclerotic heart disease of native coronary artery without angina pectoris: Secondary | ICD-10-CM

## 2022-10-25 DIAGNOSIS — Z743 Need for continuous supervision: Secondary | ICD-10-CM | POA: Diagnosis not present

## 2022-10-25 DIAGNOSIS — R140 Abdominal distension (gaseous): Secondary | ICD-10-CM | POA: Diagnosis not present

## 2022-10-25 DIAGNOSIS — E785 Hyperlipidemia, unspecified: Secondary | ICD-10-CM | POA: Diagnosis not present

## 2022-10-25 DIAGNOSIS — I083 Combined rheumatic disorders of mitral, aortic and tricuspid valves: Secondary | ICD-10-CM | POA: Diagnosis present

## 2022-10-25 DIAGNOSIS — K567 Ileus, unspecified: Secondary | ICD-10-CM | POA: Diagnosis not present

## 2022-10-25 DIAGNOSIS — I421 Obstructive hypertrophic cardiomyopathy: Secondary | ICD-10-CM | POA: Diagnosis not present

## 2022-10-25 DIAGNOSIS — K6389 Other specified diseases of intestine: Secondary | ICD-10-CM | POA: Diagnosis not present

## 2022-10-25 DIAGNOSIS — R17 Unspecified jaundice: Secondary | ICD-10-CM | POA: Diagnosis not present

## 2022-10-25 DIAGNOSIS — S7291XA Unspecified fracture of right femur, initial encounter for closed fracture: Secondary | ICD-10-CM | POA: Diagnosis not present

## 2022-10-25 DIAGNOSIS — E7800 Pure hypercholesterolemia, unspecified: Secondary | ICD-10-CM | POA: Diagnosis present

## 2022-10-25 DIAGNOSIS — I700 Atherosclerosis of aorta: Secondary | ICD-10-CM | POA: Diagnosis present

## 2022-10-25 DIAGNOSIS — F339 Major depressive disorder, recurrent, unspecified: Secondary | ICD-10-CM | POA: Diagnosis present

## 2022-10-25 DIAGNOSIS — I480 Paroxysmal atrial fibrillation: Secondary | ICD-10-CM | POA: Diagnosis not present

## 2022-10-25 DIAGNOSIS — Z8616 Personal history of COVID-19: Secondary | ICD-10-CM | POA: Diagnosis not present

## 2022-10-25 DIAGNOSIS — W19XXXD Unspecified fall, subsequent encounter: Secondary | ICD-10-CM | POA: Diagnosis not present

## 2022-10-25 DIAGNOSIS — S72001A Fracture of unspecified part of neck of right femur, initial encounter for closed fracture: Secondary | ICD-10-CM | POA: Diagnosis not present

## 2022-10-25 DIAGNOSIS — R443 Hallucinations, unspecified: Secondary | ICD-10-CM | POA: Diagnosis present

## 2022-10-25 DIAGNOSIS — I482 Chronic atrial fibrillation, unspecified: Secondary | ICD-10-CM | POA: Diagnosis present

## 2022-10-25 DIAGNOSIS — I509 Heart failure, unspecified: Secondary | ICD-10-CM | POA: Diagnosis not present

## 2022-10-25 DIAGNOSIS — I6523 Occlusion and stenosis of bilateral carotid arteries: Secondary | ICD-10-CM | POA: Diagnosis not present

## 2022-10-25 DIAGNOSIS — Y92239 Unspecified place in hospital as the place of occurrence of the external cause: Secondary | ICD-10-CM | POA: Diagnosis present

## 2022-10-25 DIAGNOSIS — I340 Nonrheumatic mitral (valve) insufficiency: Secondary | ICD-10-CM | POA: Diagnosis not present

## 2022-10-25 DIAGNOSIS — I5032 Chronic diastolic (congestive) heart failure: Secondary | ICD-10-CM | POA: Diagnosis present

## 2022-10-25 DIAGNOSIS — I10 Essential (primary) hypertension: Secondary | ICD-10-CM | POA: Diagnosis not present

## 2022-10-25 DIAGNOSIS — I110 Hypertensive heart disease with heart failure: Secondary | ICD-10-CM | POA: Diagnosis present

## 2022-10-25 DIAGNOSIS — I495 Sick sinus syndrome: Secondary | ICD-10-CM | POA: Diagnosis present

## 2022-10-25 DIAGNOSIS — G8929 Other chronic pain: Secondary | ICD-10-CM | POA: Diagnosis not present

## 2022-10-25 DIAGNOSIS — E1169 Type 2 diabetes mellitus with other specified complication: Secondary | ICD-10-CM | POA: Diagnosis not present

## 2022-10-25 DIAGNOSIS — R41841 Cognitive communication deficit: Secondary | ICD-10-CM | POA: Diagnosis not present

## 2022-10-25 DIAGNOSIS — R262 Difficulty in walking, not elsewhere classified: Secondary | ICD-10-CM | POA: Diagnosis not present

## 2022-10-25 DIAGNOSIS — Z681 Body mass index (BMI) 19 or less, adult: Secondary | ICD-10-CM | POA: Diagnosis not present

## 2022-10-25 DIAGNOSIS — R2689 Other abnormalities of gait and mobility: Secondary | ICD-10-CM | POA: Diagnosis not present

## 2022-10-25 DIAGNOSIS — S7291XD Unspecified fracture of right femur, subsequent encounter for closed fracture with routine healing: Secondary | ICD-10-CM | POA: Diagnosis not present

## 2022-10-25 DIAGNOSIS — M4856XA Collapsed vertebra, not elsewhere classified, lumbar region, initial encounter for fracture: Secondary | ICD-10-CM | POA: Diagnosis present

## 2022-10-25 DIAGNOSIS — K9189 Other postprocedural complications and disorders of digestive system: Secondary | ICD-10-CM | POA: Diagnosis not present

## 2022-10-25 DIAGNOSIS — Z9181 History of falling: Secondary | ICD-10-CM | POA: Diagnosis not present

## 2022-10-25 DIAGNOSIS — Z4789 Encounter for other orthopedic aftercare: Secondary | ICD-10-CM | POA: Diagnosis not present

## 2022-10-25 DIAGNOSIS — I4891 Unspecified atrial fibrillation: Secondary | ICD-10-CM

## 2022-10-25 DIAGNOSIS — R29818 Other symptoms and signs involving the nervous system: Secondary | ICD-10-CM | POA: Diagnosis not present

## 2022-10-25 DIAGNOSIS — F411 Generalized anxiety disorder: Secondary | ICD-10-CM | POA: Diagnosis not present

## 2022-10-25 DIAGNOSIS — Z950 Presence of cardiac pacemaker: Secondary | ICD-10-CM | POA: Diagnosis not present

## 2022-10-25 DIAGNOSIS — F29 Unspecified psychosis not due to a substance or known physiological condition: Secondary | ICD-10-CM | POA: Diagnosis not present

## 2022-10-25 DIAGNOSIS — Z66 Do not resuscitate: Secondary | ICD-10-CM | POA: Diagnosis present

## 2022-10-25 DIAGNOSIS — E1149 Type 2 diabetes mellitus with other diabetic neurological complication: Secondary | ICD-10-CM | POA: Diagnosis not present

## 2022-10-25 DIAGNOSIS — R2681 Unsteadiness on feet: Secondary | ICD-10-CM | POA: Diagnosis not present

## 2022-10-25 DIAGNOSIS — S72009A Fracture of unspecified part of neck of unspecified femur, initial encounter for closed fracture: Secondary | ICD-10-CM | POA: Diagnosis present

## 2022-10-25 DIAGNOSIS — S72141A Displaced intertrochanteric fracture of right femur, initial encounter for closed fracture: Secondary | ICD-10-CM | POA: Diagnosis not present

## 2022-10-25 DIAGNOSIS — M6281 Muscle weakness (generalized): Secondary | ICD-10-CM | POA: Diagnosis not present

## 2022-10-25 DIAGNOSIS — K56609 Unspecified intestinal obstruction, unspecified as to partial versus complete obstruction: Secondary | ICD-10-CM | POA: Diagnosis not present

## 2022-10-25 DIAGNOSIS — R5381 Other malaise: Secondary | ICD-10-CM | POA: Diagnosis not present

## 2022-10-25 DIAGNOSIS — I350 Nonrheumatic aortic (valve) stenosis: Secondary | ICD-10-CM | POA: Diagnosis not present

## 2022-10-25 DIAGNOSIS — E876 Hypokalemia: Secondary | ICD-10-CM | POA: Diagnosis not present

## 2022-10-25 DIAGNOSIS — W010XXA Fall on same level from slipping, tripping and stumbling without subsequent striking against object, initial encounter: Secondary | ICD-10-CM | POA: Diagnosis not present

## 2022-10-25 DIAGNOSIS — E1136 Type 2 diabetes mellitus with diabetic cataract: Secondary | ICD-10-CM | POA: Diagnosis present

## 2022-10-25 DIAGNOSIS — E8809 Other disorders of plasma-protein metabolism, not elsewhere classified: Secondary | ICD-10-CM | POA: Diagnosis present

## 2022-10-25 DIAGNOSIS — R441 Visual hallucinations: Secondary | ICD-10-CM | POA: Diagnosis not present

## 2022-10-25 DIAGNOSIS — G4489 Other headache syndrome: Secondary | ICD-10-CM | POA: Diagnosis present

## 2022-10-25 DIAGNOSIS — R519 Headache, unspecified: Secondary | ICD-10-CM | POA: Diagnosis not present

## 2022-10-25 DIAGNOSIS — I6602 Occlusion and stenosis of left middle cerebral artery: Secondary | ICD-10-CM | POA: Diagnosis not present

## 2022-10-25 DIAGNOSIS — S72141D Displaced intertrochanteric fracture of right femur, subsequent encounter for closed fracture with routine healing: Secondary | ICD-10-CM | POA: Diagnosis not present

## 2022-10-25 DIAGNOSIS — E1142 Type 2 diabetes mellitus with diabetic polyneuropathy: Secondary | ICD-10-CM | POA: Diagnosis present

## 2022-10-25 DIAGNOSIS — R1311 Dysphagia, oral phase: Secondary | ICD-10-CM | POA: Diagnosis not present

## 2022-10-25 DIAGNOSIS — I361 Nonrheumatic tricuspid (valve) insufficiency: Secondary | ICD-10-CM | POA: Diagnosis not present

## 2022-10-25 DIAGNOSIS — E43 Unspecified severe protein-calorie malnutrition: Secondary | ICD-10-CM | POA: Diagnosis present

## 2022-10-25 HISTORY — PX: INTRAMEDULLARY (IM) NAIL INTERTROCHANTERIC: SHX5875

## 2022-10-25 LAB — BASIC METABOLIC PANEL
Anion gap: 9 (ref 5–15)
BUN: 6 mg/dL — ABNORMAL LOW (ref 8–23)
CO2: 28 mmol/L (ref 22–32)
Calcium: 9.2 mg/dL (ref 8.9–10.3)
Chloride: 98 mmol/L (ref 98–111)
Creatinine, Ser: 0.61 mg/dL (ref 0.44–1.00)
GFR, Estimated: >60 mL/min (ref >60)
Glucose, Bld: 136 mg/dL — ABNORMAL HIGH (ref 70–99)
Potassium: 3 mmol/L — ABNORMAL LOW (ref 3.5–5.1)
Sodium: 135 mmol/L (ref 135–145)

## 2022-10-25 LAB — CBC
HCT: 42.8 % (ref 36.0–46.0)
Hemoglobin: 14.1 g/dL (ref 12.0–15.0)
MCH: 31 pg (ref 26.0–34.0)
MCHC: 32.9 g/dL (ref 30.0–36.0)
MCV: 94.1 fL (ref 80.0–100.0)
Platelets: 229 10*3/uL (ref 150–400)
RBC: 4.55 MIL/uL (ref 3.87–5.11)
RDW: 13 % (ref 11.5–15.5)
WBC: 5.1 10*3/uL (ref 4.0–10.5)
nRBC: 0 % (ref 0.0–0.2)

## 2022-10-25 LAB — GLUCOSE, CAPILLARY
Glucose-Capillary: 140 mg/dL — ABNORMAL HIGH (ref 70–99)
Glucose-Capillary: 228 mg/dL — ABNORMAL HIGH (ref 70–99)
Glucose-Capillary: 157 mg/dL — ABNORMAL HIGH (ref 70–99)
Glucose-Capillary: 180 mg/dL — ABNORMAL HIGH (ref 70–99)

## 2022-10-25 LAB — SURGICAL PCR SCREEN
MRSA, PCR: NEGATIVE
Staphylococcus aureus: NEGATIVE

## 2022-10-25 LAB — ECHOCARDIOGRAM COMPLETE: Height: 66 in

## 2022-10-25 LAB — PROTIME-INR
INR: 2.1 — ABNORMAL HIGH (ref 0.8–1.2)
Prothrombin Time: 24.1 seconds — ABNORMAL HIGH (ref 11.4–15.2)

## 2022-10-25 SURGERY — FIXATION, FRACTURE, INTERTROCHANTERIC, WITH INTRAMEDULLARY ROD
Anesthesia: General | Laterality: Right

## 2022-10-25 MED ORDER — AMISULPRIDE (ANTIEMETIC) 5 MG/2ML IV SOLN
10.0000 mg | Freq: Once | INTRAVENOUS | Status: AC
  Administered 2022-10-25: 10 mg via INTRAVENOUS

## 2022-10-25 MED ORDER — PHENOL 1.4 % MT LIQD
1.0000 | OROMUCOSAL | Status: DC | PRN
  Administered 2022-10-29: 1 via OROMUCOSAL
  Filled 2022-10-25: qty 177

## 2022-10-25 MED ORDER — FENTANYL CITRATE (PF) 100 MCG/2ML IJ SOLN
INTRAMUSCULAR | Status: AC
  Filled 2022-10-25: qty 2

## 2022-10-25 MED ORDER — TRANEXAMIC ACID-NACL 1000-0.7 MG/100ML-% IV SOLN
INTRAVENOUS | Status: AC
  Filled 2022-10-25: qty 100

## 2022-10-25 MED ORDER — FENTANYL CITRATE (PF) 250 MCG/5ML IJ SOLN
INTRAMUSCULAR | Status: DC | PRN
  Administered 2022-10-25 (×2): 25 ug via INTRAVENOUS

## 2022-10-25 MED ORDER — CHLORHEXIDINE GLUCONATE 4 % EX SOLN
60.0000 mL | Freq: Once | CUTANEOUS | Status: DC
  Filled 2022-10-25: qty 60

## 2022-10-25 MED ORDER — 0.9 % SODIUM CHLORIDE (POUR BTL) OPTIME
TOPICAL | Status: DC | PRN
  Administered 2022-10-25: 1000 mL

## 2022-10-25 MED ORDER — TRANEXAMIC ACID-NACL 1000-0.7 MG/100ML-% IV SOLN
1000.0000 mg | INTRAVENOUS | Status: AC
  Administered 2022-10-25: 1000 mg via INTRAVENOUS
  Filled 2022-10-25: qty 100

## 2022-10-25 MED ORDER — SODIUM CHLORIDE 0.9 % IV SOLN: INTRAVENOUS | Status: DC

## 2022-10-25 MED ORDER — PROPOFOL 10 MG/ML IV BOLUS
INTRAVENOUS | Status: DC | PRN
  Administered 2022-10-25: 60 mg via INTRAVENOUS

## 2022-10-25 MED ORDER — CHLORHEXIDINE GLUCONATE 0.12 % MT SOLN: 15.0000 mL | Freq: Once | OROMUCOSAL | Status: AC

## 2022-10-25 MED ORDER — POTASSIUM CHLORIDE CRYS ER 20 MEQ PO TBCR
40.0000 meq | EXTENDED_RELEASE_TABLET | Freq: Once | ORAL | Status: DC
  Filled 2022-10-25: qty 2

## 2022-10-25 MED ORDER — CEFAZOLIN SODIUM-DEXTROSE 2-4 GM/100ML-% IV SOLN
INTRAVENOUS | Status: AC
  Filled 2022-10-25: qty 100

## 2022-10-25 MED ORDER — DOCUSATE SODIUM 100 MG PO CAPS
100.0000 mg | ORAL_CAPSULE | Freq: Two times a day (BID) | ORAL | Status: DC
  Administered 2022-10-26 – 2022-10-27 (×3): 100 mg via ORAL
  Filled 2022-10-25 (×6): qty 1

## 2022-10-25 MED ORDER — PROPOFOL 500 MG/50ML IV EMUL
INTRAVENOUS | Status: DC | PRN
  Administered 2022-10-25: 30 ug/kg/min via INTRAVENOUS

## 2022-10-25 MED ORDER — POLYETHYLENE GLYCOL 3350 17 G PO PACK
17.0000 g | PACK | Freq: Two times a day (BID) | ORAL | Status: DC
  Administered 2022-10-26 – 2022-11-02 (×8): 17 g via ORAL
  Filled 2022-10-25 (×11): qty 1

## 2022-10-25 MED ORDER — AMISULPRIDE (ANTIEMETIC) 5 MG/2ML IV SOLN
INTRAVENOUS | Status: AC
  Filled 2022-10-25: qty 4

## 2022-10-25 MED ORDER — SUCCINYLCHOLINE CHLORIDE 200 MG/10ML IV SOSY
PREFILLED_SYRINGE | INTRAVENOUS | Status: DC | PRN
  Administered 2022-10-25: 100 mg via INTRAVENOUS

## 2022-10-25 MED ORDER — OXYCODONE HCL 5 MG PO TABS
2.5000 mg | ORAL_TABLET | ORAL | Status: DC | PRN
  Administered 2022-10-26 – 2022-10-27 (×2): 5 mg via ORAL
  Administered 2022-10-28: 2.5 mg via ORAL
  Administered 2022-10-30 – 2022-11-01 (×3): 5 mg via ORAL
  Filled 2022-10-25 (×9): qty 1

## 2022-10-25 MED ORDER — HYDROMORPHONE HCL 1 MG/ML IJ SOLN
0.5000 mg | INTRAMUSCULAR | Status: DC | PRN
  Administered 2022-10-25 – 2022-10-26 (×2): 0.5 mg via INTRAVENOUS
  Filled 2022-10-25 (×2): qty 0.5

## 2022-10-25 MED ORDER — DEXAMETHASONE SODIUM PHOSPHATE 10 MG/ML IJ SOLN
INTRAMUSCULAR | Status: DC | PRN
  Administered 2022-10-25: 5 mg via INTRAVENOUS

## 2022-10-25 MED ORDER — DIPHENHYDRAMINE HCL 12.5 MG/5ML PO ELIX: 12.5000 mg | ORAL_SOLUTION | ORAL | Status: DC | PRN

## 2022-10-25 MED ORDER — TRANEXAMIC ACID-NACL 1000-0.7 MG/100ML-% IV SOLN
1000.0000 mg | Freq: Once | INTRAVENOUS | Status: DC
  Filled 2022-10-25: qty 100

## 2022-10-25 MED ORDER — LIDOCAINE 2% (20 MG/ML) 5 ML SYRINGE
INTRAMUSCULAR | Status: DC | PRN
  Administered 2022-10-25: 60 mg via INTRAVENOUS

## 2022-10-25 MED ORDER — LACTATED RINGERS IV SOLN: INTRAVENOUS | Status: DC

## 2022-10-25 MED ORDER — CEFAZOLIN SODIUM-DEXTROSE 2-4 GM/100ML-% IV SOLN
2.0000 g | INTRAVENOUS | Status: AC
  Administered 2022-10-25: 2 g via INTRAVENOUS
  Filled 2022-10-25: qty 100

## 2022-10-25 MED ORDER — DEXAMETHASONE SODIUM PHOSPHATE 10 MG/ML IJ SOLN
10.0000 mg | Freq: Once | INTRAMUSCULAR | Status: AC
  Administered 2022-10-26: 10 mg via INTRAVENOUS
  Filled 2022-10-25: qty 1

## 2022-10-25 MED ORDER — ONDANSETRON HCL 4 MG/2ML IJ SOLN
INTRAMUSCULAR | Status: DC | PRN
  Administered 2022-10-25: 4 mg via INTRAVENOUS

## 2022-10-25 MED ORDER — FENTANYL CITRATE (PF) 250 MCG/5ML IJ SOLN
INTRAMUSCULAR | Status: AC
  Filled 2022-10-25: qty 5

## 2022-10-25 MED ORDER — ACETAMINOPHEN 325 MG PO TABS
325.0000 mg | ORAL_TABLET | Freq: Four times a day (QID) | ORAL | Status: DC | PRN
  Administered 2022-10-27 – 2022-11-02 (×9): 650 mg via ORAL
  Filled 2022-10-25 (×9): qty 2

## 2022-10-25 MED ORDER — POVIDONE-IODINE 10 % EX SWAB
2.0000 | Freq: Once | CUTANEOUS | Status: AC
  Administered 2022-10-25: 2 via TOPICAL

## 2022-10-25 MED ORDER — METHOCARBAMOL 500 MG PO TABS
500.0000 mg | ORAL_TABLET | Freq: Four times a day (QID) | ORAL | Status: DC | PRN
  Administered 2022-10-26 – 2022-11-01 (×6): 500 mg via ORAL
  Filled 2022-10-25 (×7): qty 1

## 2022-10-25 MED ORDER — INSULIN ASPART 100 UNIT/ML IJ SOLN: 0.0000 [IU] | INTRAMUSCULAR | Status: DC | PRN

## 2022-10-25 MED ORDER — ASPIRIN 81 MG PO CHEW
81.0000 mg | CHEWABLE_TABLET | Freq: Two times a day (BID) | ORAL | Status: DC
  Administered 2022-10-26 (×2): 81 mg via ORAL
  Filled 2022-10-25 (×2): qty 1

## 2022-10-25 MED ORDER — WARFARIN SODIUM 2 MG PO TABS: 2.0000 mg | ORAL_TABLET | Freq: Once | ORAL | Status: DC

## 2022-10-25 MED ORDER — OXYCODONE HCL 5 MG PO TABS: 5.0000 mg | ORAL_TABLET | ORAL | Status: DC | PRN

## 2022-10-25 MED ORDER — PROPOFOL 10 MG/ML IV BOLUS
INTRAVENOUS | Status: AC
  Filled 2022-10-25: qty 20

## 2022-10-25 MED ORDER — BISACODYL 10 MG RE SUPP
10.0000 mg | Freq: Every day | RECTAL | Status: DC | PRN
  Administered 2022-10-28: 10 mg via RECTAL
  Filled 2022-10-25: qty 1

## 2022-10-25 MED ORDER — CEFAZOLIN SODIUM-DEXTROSE 2-4 GM/100ML-% IV SOLN
2.0000 g | Freq: Four times a day (QID) | INTRAVENOUS | Status: AC
  Administered 2022-10-25 – 2022-10-26 (×2): 2 g via INTRAVENOUS
  Filled 2022-10-25 (×2): qty 100

## 2022-10-25 MED ORDER — METHOCARBAMOL 1000 MG/10ML IJ SOLN: 500.0000 mg | Freq: Four times a day (QID) | INTRAVENOUS | Status: DC | PRN

## 2022-10-25 MED ORDER — FENTANYL CITRATE (PF) 100 MCG/2ML IJ SOLN
25.0000 ug | INTRAMUSCULAR | Status: DC | PRN
  Administered 2022-10-25 (×2): 25 ug via INTRAVENOUS
  Administered 2022-10-25: 50 ug via INTRAVENOUS

## 2022-10-25 MED ORDER — MENTHOL 3 MG MT LOZG
1.0000 | LOZENGE | OROMUCOSAL | Status: DC | PRN
  Filled 2022-10-25: qty 9

## 2022-10-25 MED ORDER — TRAMADOL HCL 50 MG PO TABS
50.0000 mg | ORAL_TABLET | Freq: Four times a day (QID) | ORAL | Status: DC | PRN
  Administered 2022-10-25: 50 mg via ORAL
  Filled 2022-10-25: qty 1

## 2022-10-25 MED ORDER — CHLORHEXIDINE GLUCONATE 0.12 % MT SOLN
OROMUCOSAL | Status: AC
  Administered 2022-10-25: 15 mL via OROMUCOSAL
  Filled 2022-10-25: qty 15

## 2022-10-25 MED ORDER — ORAL CARE MOUTH RINSE: 15.0000 mL | Freq: Once | OROMUCOSAL | Status: AC

## 2022-10-25 SURGICAL SUPPLY — 32 items
BAG COUNTER SPONGE SURGICOUNT (BAG) ×1 IMPLANT
BIT DRILL CANN LG 4.3MM (BIT) IMPLANT
COVER PERINEAL POST (MISCELLANEOUS) ×1 IMPLANT
COVER SURGICAL LIGHT HANDLE (MISCELLANEOUS) ×1 IMPLANT
DERMABOND ADVANCED .7 DNX12 (GAUZE/BANDAGES/DRESSINGS) ×1 IMPLANT
DRAPE STERI IOBAN 125X83 (DRAPES) ×1 IMPLANT
DRILL BIT CANN LG 4.3MM (BIT) ×1
DRSG AQUACEL AG ADV 3.5X 6 (GAUZE/BANDAGES/DRESSINGS) IMPLANT
DURAPREP 26ML APPLICATOR (WOUND CARE) ×1 IMPLANT
ELECT REM PT RETURN 9FT ADLT (ELECTROSURGICAL) ×1
ELECTRODE REM PT RTRN 9FT ADLT (ELECTROSURGICAL) ×1 IMPLANT
FACESHIELD WRAPAROUND (MASK) ×1 IMPLANT
FACESHIELD WRAPAROUND OR TEAM (MASK) ×2 IMPLANT
GLOVE BIO SURGEON STRL SZ7.5 (GLOVE) ×1 IMPLANT
GLOVE BIOGEL PI IND STRL 7.5 (GLOVE) ×2 IMPLANT
GOWN STRL REUS W/ TWL LRG LVL3 (GOWN DISPOSABLE) ×2 IMPLANT
GOWN STRL REUS W/TWL LRG LVL3 (GOWN DISPOSABLE) ×2
GUIDEPIN VERSANAIL DSP 3.2X444 (ORTHOPEDIC DISPOSABLE SUPPLIES) IMPLANT
KIT TURNOVER KIT B (KITS) ×1 IMPLANT
NAIL HIP FRACT 130D 11X180 (Screw) IMPLANT
NS IRRIG 1000ML POUR BTL (IV SOLUTION) ×1 IMPLANT
PACK GENERAL/GYN (CUSTOM PROCEDURE TRAY) ×1 IMPLANT
PAD ARMBOARD 7.5X6 YLW CONV (MISCELLANEOUS) ×2 IMPLANT
SCREW BONE CORTICAL 5.0X36 (Screw) IMPLANT
SCREW CANN THRD AFF 10.5X100 (Screw) IMPLANT
SUT MNCRL AB 4-0 PS2 18 (SUTURE) ×1 IMPLANT
SUT VIC AB 1 CT1 27 (SUTURE) ×1
SUT VIC AB 1 CT1 27XBRD ANBCTR (SUTURE) ×1 IMPLANT
SUT VIC AB 2-0 CT1 27 (SUTURE) ×2
SUT VIC AB 2-0 CT1 TAPERPNT 27 (SUTURE) ×1 IMPLANT
TOWEL GREEN STERILE (TOWEL DISPOSABLE) ×1 IMPLANT
WATER STERILE IRR 1000ML POUR (IV SOLUTION) ×1 IMPLANT

## 2022-10-25 NOTE — Progress Notes (Addendum)
PT Cancellation Note  Patient Details Name: Leah Holmes MRN: [id number] DOB: [birth year]   Cancelled Treatment:    Reason Eval/Treat Not Completed: (P) Medical issues which prohibited therapy. Pt recently fell prior to attempt at PT Eval and is currently waiting for imaging of her R leg to ensure no acute abnormalities s/p fall. Will plan to follow-up later as time permits.  Addendum 11:18 - Received orders to discontinue PT Eval and treat. Please re-consult PT later if needed.   Raymond Gurney, PT, DPT Acute Rehabilitation Services  Office: [phone]    Jewel Baize 10/25/2022, 9:59 AM

## 2022-10-25 NOTE — Anesthesia Procedure Notes (Signed)
Procedure Name: Intubation Date/Time: 10/25/2022 3:44 PM  Performed by: Collene Schlichter, MDPre-anesthesia Checklist: Patient identified, Emergency Drugs available, Suction available and Patient being monitored Patient Re-evaluated:Patient Re-evaluated prior to induction Oxygen Delivery Method: Circle system utilized Preoxygenation: Pre-oxygenation with 100% oxygen Induction Type: IV induction Ventilation: Mask ventilation without difficulty Laryngoscope Size: Mac and 3 Grade View: Grade I Tube type: Oral Tube size: 7.0 mm Number of attempts: 1 Airway Equipment and Method: Stylet and Oral airway Placement Confirmation: ETT inserted through vocal cords under direct vision, positive ETCO2 and breath sounds checked- equal and bilateral Secured at: 20 cm Tube secured with: Tape Dental Injury: Teeth and Oropharynx as per pre-operative assessment

## 2022-10-25 NOTE — Anesthesia Preprocedure Evaluation (Addendum)
Anesthesia Evaluation  Patient identified by MRN, date of birth, ID band Patient awake    Reviewed: Allergy & Precautions, NPO status , Patient's Chart, lab work & pertinent test results, reviewed documented beta blocker date and time   History of Anesthesia Complications (+) PONV and history of anesthetic complications  Airway Mallampati: II  TM Distance: >3 FB Neck ROM: Full    Dental  (+) Dental Advisory Given, Edentulous Lower, Edentulous Upper   Pulmonary neg pulmonary ROS   Pulmonary exam normal breath sounds clear to auscultation       Cardiovascular hypertension, Pt. on medications and Pt. on home beta blockers + CAD and +CHF  Normal cardiovascular exam+ dysrhythmias Atrial Fibrillation + pacemaker  Rhythm:Regular Rate:Normal     Neuro/Psych  Headaches  Neuromuscular disease    GI/Hepatic negative GI ROS, Neg liver ROS,,,  Endo/Other  diabetes, Type 2    Renal/GU negative Renal ROS     Musculoskeletal negative musculoskeletal ROS (+)  Left hip fracture    Abdominal   Peds  Hematology  (+) Blood dyscrasia (Warfarin)   Anesthesia Other Findings   Reproductive/Obstetrics                             Anesthesia Physical Anesthesia Plan  ASA: 4  Anesthesia Plan: General   Post-op Pain Management: Tylenol PO (pre-op)*   Induction:   PONV Risk Score and Plan: 4 or greater and TIVA, Dexamethasone and Ondansetron  Airway Management Planned: Oral ETT  Additional Equipment:   Intra-op Plan:   Post-operative Plan: Extubation in OR  Informed Consent: I have reviewed the patients History and Physical, chart, labs and discussed the procedure including the risks, benefits and alternatives for the proposed anesthesia with the patient or authorized representative who has indicated his/her understanding and acceptance.   Patient has DNR.  Discussed DNR with patient, Discussed DNR  with power of attorney and Continue DNR.   Dental advisory given  Plan Discussed with: CRNA  Anesthesia Plan Comments: (Discussed DNR with patient and patient's daughter at bedside. Will keep order in place. )        Anesthesia Quick Evaluation

## 2022-10-25 NOTE — Consult Note (Signed)
Reason for Consult: right hip fracture Referring Physician: Sharon Seller, MD  Leah Holmes is an 89 y.o. female.  HPI: 89 year old female admitted to the hospital recently for headaches.  She has had a workup to rule out seizure disorder as well as stroke.  She has been working with physical therapy.  Unfortunately today she was in the bathroom where she had a slip and fall while in the hospital landing onto her right side with immediate pain.  Radiographs were obtained indicating comminuted right intertrochanteric femur fracture.  Orthopedics was consulted for management.  Past Medical History:  Diagnosis Date   Atrial fibrillation (HCC) 2012   Breast cancer (HCC) 04/2018   IDC of left breast    CHF (congestive heart failure) (HCC)    Colon polyp 2003   Compression fracture of L2 lumbar vertebra (HCC)    Coronary artery disease    COVID-19 virus infection 12/25/2019   Diabetes mellitus without complication (HCC)    Dyspnea    Dysrhythmia    atrial fib   Environmental and seasonal allergies    GERD (gastroesophageal reflux disease)    Hyperlipidemia    Hypertension    Hypertensive urgency 03/10/2021   Irritable bowel syndrome    Multiple rib fractures 03/15/2018   Personal history of radiation therapy    Polyp of colon    PONV (postoperative nausea and vomiting)    with ether, not recently "woke up in surgery once"   Presence of permanent cardiac pacemaker    Tremor    Tremors of nervous system    Ulcer     Past Surgical History:  Procedure Laterality Date   ABDOMINAL ADHESION SURGERY     ABDOMINAL HYSTERECTOMY     APPENDECTOMY     BREAST BIOPSY Right late 80s/early 90s   benign   BREAST BIOPSY Left 03/31/2018   [place] bx done with Dr. Lemar Livings, invasive ductal carcinoma   BREAST LUMPECTOMY Left 04/22/2018   Procedure: BREAST LUMPECTOMY;  Surgeon: Earline Mayotte, MD;  Location: [facility] ORS;  Service: General;  Laterality: Left;   BREAST LUMPECTOMY Left 04/22/2018    [facility], negative LN, clear margins   BREAST LUMPECTOMY Left 2020   positive, done in office   BREAST SURGERY Right    biopsy    CESAREAN SECTION     x 2   CHOLECYSTECTOMY  03/2012   Dr Excell Seltzer   COLONOSCOPY  2003   Dr Fritzi Mandes in [place]   fibroid tumor     PACEMAKER INSERTION N/A 01/28/2016   Procedure: INSERTION PACEMAKER;  Surgeon: Marcina Millard, MD;  Location: [facility] ORS;  Service: Cardiovascular;  Laterality: N/A;   SENTINEL NODE BIOPSY Left 04/22/2018   Procedure: SENTINEL NODE BIOPSY;  Surgeon: Earline Mayotte, MD;  Location: [facility] ORS;  Service: General;  Laterality: Left;   UPPER GI ENDOSCOPY  2003   Dr Fritzi Mandes in [place]    Family History  Problem Relation Age of Onset   Breast cancer Sister    Cancer Sister 55       breast   Breast cancer Maternal Aunt 109   Breast cancer Cousin    Breast cancer Other 6    Social History:  reports that she has never smoked. She has quit using smokeless tobacco.  Her smokeless tobacco use included snuff. She reports that she does not drink alcohol and does not use drugs.  Allergies:  Allergies  Allergen Reactions   Codeine Anaphylaxis   Penicillins Anaphylaxis   Clonidine Derivatives  Other (See Comments)    Unknown reaction   Cozaar [Losartan] Other (See Comments)    Myalgias    Lipitor [Atorvastatin] Other (See Comments)    Unknown reaction   Ms Contin [Morphine] Other (See Comments)    Unknown reaction   Neurontin [Gabapentin] Nausea Only   Reglan [Metoclopramide] Other (See Comments)    Tremors    Valtrex [Valacyclovir] Other (See Comments)    Tremors    Aldactone [Spironolactone] Itching   Apresoline [Hydralazine] Anxiety   Norvasc [Amlodipine] Itching    Does cause itching. Pt does take PRN if needed.    Medications: I have reviewed the patient's current medications. Scheduled:  atenolol  25 mg Oral BID   insulin aspart  0-9 Units Subcutaneous TID WC   isosorbide mononitrate  60 mg Oral BID   lisinopril  40  mg Oral BID   neomycin-polymyxin b-dexamethasone  1 drop Both Eyes BID   potassium chloride  40 mEq Oral Once    Results for orders placed or performed during the hospital encounter of 10/23/22 (from the past 24 hour(s))  Urinalysis, Routine w reflex microscopic -Urine, Clean Catch     Status: Abnormal   Collection Time: 10/24/22 12:15 PM  Result Value Ref Range   Color, Urine YELLOW YELLOW   APPearance CLEAR CLEAR   Specific Gravity, Urine 1.014 1.005 - 1.030   pH 6.0 5.0 - 8.0   Glucose, UA NEGATIVE NEGATIVE mg/dL   Hgb urine dipstick NEGATIVE NEGATIVE   Bilirubin Urine NEGATIVE NEGATIVE   Ketones, ur NEGATIVE NEGATIVE mg/dL   Protein, ur NEGATIVE NEGATIVE mg/dL   Nitrite NEGATIVE NEGATIVE   Leukocytes,Ua NEGATIVE NEGATIVE   RBC / HPF 0-5 0 - 5 RBC/hpf   WBC, UA 0-5 0 - 5 WBC/hpf   Bacteria, UA RARE (A) NONE SEEN   Squamous Epithelial / HPF 0-5 0 - 5 /HPF   Mucus PRESENT   TSH     Status: None   Collection Time: 10/24/22  1:00 PM  Result Value Ref Range   TSH 0.986 0.350 - 4.500 uIU/mL  Vitamin B12     Status: None   Collection Time: 10/24/22  1:00 PM  Result Value Ref Range   Vitamin B-12 504 180 - 914 pg/mL  Protime-INR     Status: Abnormal   Collection Time: 10/24/22  1:00 PM  Result Value Ref Range   Prothrombin Time 22.7 (H) 11.4 - 15.2 seconds   INR 2.0 (H) 0.8 - 1.2  Glucose, capillary     Status: Abnormal   Collection Time: 10/24/22  4:40 PM  Result Value Ref Range   Glucose-Capillary 139 (H) 70 - 99 mg/dL   Comment 1 Notify RN    Comment 2 Document in Chart   Glucose, capillary     Status: Abnormal   Collection Time: 10/24/22  9:24 PM  Result Value Ref Range   Glucose-Capillary 120 (H) 70 - 99 mg/dL  Protime-INR     Status: Abnormal   Collection Time: 10/25/22  4:15 AM  Result Value Ref Range   Prothrombin Time 24.1 (H) 11.4 - 15.2 seconds   INR 2.1 (H) 0.8 - 1.2  Basic metabolic panel     Status: Abnormal   Collection Time: 10/25/22  4:15 AM  Result  Value Ref Range   Sodium 135 135 - 145 mmol/L   Potassium 3.0 (L) 3.5 - 5.1 mmol/L   Chloride 98 98 - 111 mmol/L   CO2 28 22 - 32  mmol/L   Glucose, Bld 136 (H) 70 - 99 mg/dL   BUN 6 (L) 8 - 23 mg/dL   Creatinine, Ser 1.61 0.44 - 1.00 mg/dL   Calcium 9.2 8.9 - 09.6 mg/dL   GFR, Estimated >04 >54 mL/min   Anion gap 9 5 - 15  CBC     Status: None   Collection Time: 10/25/22  4:15 AM  Result Value Ref Range   WBC 5.1 4.0 - 10.5 K/uL   RBC 4.55 3.87 - 5.11 MIL/uL   Hemoglobin 14.1 12.0 - 15.0 g/dL   HCT 09.8 11.9 - 14.7 %   MCV 94.1 80.0 - 100.0 fL   MCH 31.0 26.0 - 34.0 pg   MCHC 32.9 30.0 - 36.0 g/dL   RDW 82.9 56.2 - 13.0 %   Platelets 229 150 - 400 K/uL   nRBC 0.0 0.0 - 0.2 %  Glucose, capillary     Status: Abnormal   Collection Time: 10/25/22  6:22 AM  Result Value Ref Range   Glucose-Capillary 140 (H) 70 - 99 mg/dL  Glucose, capillary     Status: Abnormal   Collection Time: 10/25/22 11:42 AM  Result Value Ref Range   Glucose-Capillary 228 (H) 70 - 99 mg/dL   Comment 1 Notify RN    Comment 2 Document in Chart      X-ray: EXAM: DG HIP (WITH OR WITHOUT PELVIS) 2-3V RIGHT   COMPARISON:  None Available.   FINDINGS: There is a comminuted displaced intertrochanteric fracture with no dislocation identified. No other abnormalities.   IMPRESSION: Comminuted displaced intertrochanteric fracture.   These results will be called to the ordering clinician or representative by the Radiologist Assistant, and communication documented in the PACS or [employer].     Electronically Signed   By: Gerome Sam III M.D.  ROS: As per HPI  Blood pressure (!) 178/71, pulse 63, temperature 98 F (36.7 C), temperature source Oral, resp. rate 16, height 5\' 6"  (1.676 m), weight 54.4 kg, SpO2 98 %.  Physical Exam: Awake alert 89 year old female Right lower extremity exam: Shortened externally rotated right lower extremity Range of motion not assessed due to pain and  radiographic findings No significant lower extremity edema, erythema or calf tenderness  Per admission H&P GENERAL: Awake, alert, in no acute distress, essential tremor present of bilateral upper extremities and head Psych: Affect appropriate for situation, patient is calm and cooperative with examination Head: Normocephalic and atraumatic, without obvious abnormality EENT: Normal conjunctivae, dry mucous membranes, no OP obstruction LUNGS: Normal respiratory effort. Non-labored breathing on room air CV: Regular rate and rhythm on telemetry ABDOMEN: Soft, non-tender, non-distended Extremities: Warm, well perfused, without obvious deformity   Assessment/Plan: 1.  Comminuted right intertrochanteric femur fracture  Plan: The patient will need a closed reduction with intramedullary nailing of her right proximal femur.  We will try to have this addressed today.  Will have her be n.p.o. currently except for medications.  She currently is on chronic Coumadin with her most recent INR of 2.  I feel that it would be fine to proceed with surgery at this point understanding that she could have some further oozing from the fracture site however there would be limited expected blood loss from this procedure itself. Postoperatively she will be partial weightbearing and can continue work with physical therapy to make her progress towards discharge.  Shelda Pal 10/25/2022, 11:44 AM

## 2022-10-25 NOTE — Progress Notes (Signed)
Carotid artery duplex has been completed. Preliminary results can be found in CV Proc through chart review.   10/25/22 10:40 AM Olen Cordial RVT

## 2022-10-25 NOTE — Op Note (Unsigned)
NAME: Leah Holmes, Leah Holmes. MEDICAL RECORD NO: [id number] ACCOUNT NO: [id number] DATE OF BIRTH: 01/23/1927 FACILITY: [facility] LOCATION: [facility] PHYSICIAN: Madlyn Frankel. Charlann Boxer, MD  Operative Report   DATE OF PROCEDURE: 10/25/2022  PREOPERATIVE DIAGNOSIS:  Comminuted right intertrochanteric femur fracture.  POSTOPERATIVE DIAGNOSIS:  Comminuted right intertrochanteric femur fracture.  PROCEDURE:  Intramedullary nailing of right proximal femur fracture utilizing a Biomet AFFIXUS nail 11 x 180 mm with a 105 mm lag screw and a distal interlock.  SURGEON:  Madlyn Frankel. Charlann Boxer, MD.  ASSISTANT:  Rosalene Billings, PA-C.  Note that Ms. Leah Holmes was present for the entirety of the case from preoperative positioning to perioperative management of the operative extremity and primary wound closure.  ANESTHESIA:  General.  BLOOD LOSS:  Less than 100 mL.  COMPLICATIONS:  None.  DRAINS:  None.  INDICATIONS FOR THE PROCEDURE:  The patient is a 89 year old female, recently admitted to the hospital for workup for headaches and hallucinations.  She unfortunately was in the hospital in the bathroom when she slipped and fell onto her right side.  She  had immediate onset of pain and an inability to bear weight.  Radiographs revealed a comminuted intertrochanteric femur fracture.  Orthopedics was consulted for management.  I reviewed with the patient and her daughter the injury and the necessary  repair in order to stabilize the fracture to allow for healing and functional outcome.  We also discussed the improvement with pain with mobility with this.  We did review the risks of malunion, nonunion and need for future surgeries.  Our hope is for  healing to minimize further intervention.  Consent was obtained for the management of her hip fracture.  DESCRIPTION OF PROCEDURE:  The patient was brought to the operative theater.  Once adequate anesthesia, preoperative antibiotics, Ancef administered which she tolerated well despite  an anaphylactic penicillin reaction reported as well as 1 gram of  tranexamic acid.  She was positioned supine on the fracture table.  Her left lower extremity was positioned and flexed and abducted out of the way with bony prominences padded particularly over the peroneal nerve.  She was otherwise positioned carefully  against the perineal post.  Under fluoroscopic imaging, I evaluated the fracture with traction and internal rotation.  I was able to get the fracture reduced into a near anatomic position.  At this point, we prepped and draped the right hip with a shower  curtain technique.  A timeout was performed identifying the patient, planned procedure, and extremity.  Fluoroscopy was brought back into the field.  The greater trochanter was identified.  An incision was made proximal and lateral of the hip.  Soft  tissue dissection was carried through the gluteal fascia.  I then inserted the guidewire into the tip of the trochanter, which had fallen off posteriorly and passed it across the fracture site and confirmed this radiographically.  I then opened up the  proximal femur with a drill and then passed an 11 x 180 mm nail.  As I passed the nail, we did identify that the fracture reduced now into a near basically anatomic position along the medial calcar.  Once I had it at its appropriate depth, using  fluoroscopy we identified and placed a guidewire into the center of the femoral head in AP and lateral planes.  I measured and selected a 100 mm lag screw.  We drilled for this and then passed the lag screw.  Once the lag screw was positioned, we took  traction off  the lower extremity and then used the compression wheel to medialize the shaft to the fracture segment.  Once this was done, I locked the proximal locking bolt and backed it off a quarter of a turn to allow for further compression with  weightbearing.  We then placed a distal interlock through the insertion jig.  Once this was done, the jig was  removed.  Final radiograph was obtained.  The wounds were irrigated with normal saline solution.  Proximally, I reapproximated the gluteal  fascia using #1 Vicryl.  The remainder of the wound was closed in layers with 2-0 Vicryl and a running Monocryl stitch.  The wound was then cleaned, dried and dressed sterilely using surgical glue and Aquacel dressing.  The patient was then brought to  the recovery room.  Postoperatively, we will have her be partial weightbearing as best as possible to try to allow for wound healing without complication.  We will see her back in the office in 2 weeks.  She will be placed on DVT prophylaxis as tolerable.   CHR D: 10/25/2022 4:42:04 pm T: 10/25/2022 6:08:00 pm  JOB: [id number]/ [id number]

## 2022-10-25 NOTE — Progress Notes (Signed)
Patient had a witnessed fall at 09:20 in the bathroom, had a fall in the right side, provider notified, ordered for X Ray, daughter notified and is in the bedside, will continued to monitor

## 2022-10-25 NOTE — Progress Notes (Signed)
ANTICOAGULATION CONSULT NOTE - Follow Up Consult  Pharmacy Consult for Warfarin Indication: atrial fibrillation  Allergies  Allergen Reactions   Codeine Anaphylaxis   Penicillins Anaphylaxis   Clonidine Derivatives Other (See Comments)    Unknown reaction   Cozaar [Losartan] Other (See Comments)    Myalgias    Lipitor [Atorvastatin] Other (See Comments)    Unknown reaction   Ms Contin [Morphine] Other (See Comments)    Unknown reaction   Neurontin [Gabapentin] Nausea Only   Reglan [Metoclopramide] Other (See Comments)    Tremors    Valtrex [Valacyclovir] Other (See Comments)    Tremors    Aldactone [Spironolactone] Itching   Apresoline [Hydralazine] Anxiety   Norvasc [Amlodipine] Itching    Does cause itching. Pt does take PRN if needed.    Patient Measurements: Height: 5\' 6"  (167.6 cm) Weight: 54.4 kg (119 lb 14.9 oz) IBW/kg (Calculated) : 59.3  Vital Signs: Temp: 98 F (36.7 C) (05/12 0349) Temp Source: Oral (05/12 0005) BP: 178/82 (05/12 0349) Pulse Rate: 59 (05/12 0349)  Labs: Recent Labs    10/23/22 1339 10/24/22 1300 10/25/22 0415  HGB 13.8  --  14.1  HCT 41.4  --  42.8  PLT 223  --  229  APTT 47*  --   --   LABPROT 22.6* 22.7* 24.1*  INR 2.0* 2.0* 2.1*  CREATININE 0.55  --  0.61    Estimated Creatinine Clearance: 36.9 mL/min (by C-G formula based on SCr of 0.61 mg/dL).   Medications:  Scheduled:   atenolol  25 mg Oral BID   hydrochlorothiazide  25 mg Oral QODAY   insulin aspart  0-9 Units Subcutaneous TID WC   isosorbide mononitrate  60 mg Oral BID   lisinopril  40 mg Oral BID   neomycin-polymyxin b-dexamethasone  1 drop Both Eyes BID   potassium chloride  40 mEq Oral Once   Warfarin - Pharmacist Dosing Inpatient   Does not apply q1600   Assessment: 11 yoF with a history of AF on warfarin, CAD, HF, HLD, HTN, IBS, essential tremor, PPM, lt breast cancer s/p lumpectomy and radiation, and T2DM. Patient is presenting with  headache/hallucinations. Warfarin per pharmacy consult placed for atrial fibrillation.   Patient taking warfarin prior to arrival. Home dose is 2 mg Sun & Wed and 4 mg all other days. Last PTA dose taken 5/9 2000.  INR today is therapeutic at 2.1. Hgb 14.1, plt 229 - stable. No signs/symptoms of bleeding noted per RN.   Goal of Therapy:  INR 2-3 Monitor platelets by anticoagulation protocol: Yes   Plan:  Give Warfarin 2 mg x 1 today.  Check daily INR. Monitor CBC and for signs/symptoms of bleeding.    Jerrilyn Cairo, PharmD PGY-2 Pharmacy Resident Phone [phone] 10/25/2022 7:04 AM   Please check AMION for all [facility] phone numbers After 10:00 PM, call Main Pharmacy [phone]'

## 2022-10-25 NOTE — Progress Notes (Signed)
Leah Holmes  ZOX:[id number] DOB: 1926-12-11 DOA: 10/23/2022 PCP: Sherlene Shams, MD    Brief Narrative:  89 year old with a history of DM2, CAD, chronic atrial fibrillation on warfarin, HTN, HLD, anxiety/depression, remote treatment for breast cancer, and SSS status post pacemaker placement who presented to the [facility] ER 5/10 with a 1 week history of HA and hallucinations.  She had been evaluated by an Ophthalmologist in the outpatient setting who found no acute issues.  She was transferred to [facility] ER from [facility] to undergo an MRI.  CT head at presentation revealed no acute findings.  CXR was unrevealing.  Consultants:  Neurology  Orthopedic Surgery  Goals of Care:  Code Status: DNR   DVT prophylaxis: SCDs for now   Interim Hx: The patient continues to experience ongoing headache and some visual hallucinations.  She is otherwise alert conversant and pleasant.  Neurology felt that further inpatient evaluation was not necessary.  While awaiting physical therapy evaluation the patient got up to go to the bathroom and suffered a fall in the bathroom.  Nurse tech was present at the time of her fall and reports that she did not strike her head but that she fell onto her right hip.  Following this the nurse tech transported the patient back to her bed using a rolling chair and follow-up hip x-rays were accomplished.  These x-rays revealed the patient had suffered an acute intertrochanteric right femur fracture.  Assessment & Plan:  Mechanical fall in the hospital with resultant comminuted displaced intertrochanteric fracture of the right femur I met with the patient and her daughter at bedside and discussed this new development at length -I explained that given her advanced age, CAD, and valvular disease that she would be considered a high risk for surgery but that failure to undergo surgery would do her to bed rest for the remainder of her life, and that this would undoubtedly  significantly shorten her life as well as the quality of her remaining life -the patient and her daughter were willing to pursue surgery and attempt to preserve her quality of life - Orthopedics was consulted and will take the patient to the OR this evening  Persisting headache with visual hallucinations Has been evaluated by Neurology - EEG unrevealing - CT head x2 w/o acute findings - carotid Dopplers without evidence of significant stenosis bilaterally - no further inpatient w/u is felt to be indicated   Chronic atrial fibrillation on warfarin Rate is well-controlled -hold Coumadin for now -discussed with Orthopedist who does not feel that her INR needs to be corrected further prior to surgery  Hypokalemia Supplement and monitor trend -check magnesium  HTN Avoid overcorrection at present -monitor trend in perioperative period  CAD Diffuse three-vessel CAD by cardiac cath 2012 with critical mid LAD stenosis not amenable to PCI -has declined follow-up testing as she would not wish to pursue intervention -no recent anginal symptoms with exertion  DM2 CBG reasonably well-controlled -monitor trend  SSS status post pacemaker insertion 2017  HLD Continue usual medical therapy  Aortic stenosis Graded as mild via TTE 2022  Family Communication: Spoke with daughter at bedside at length Disposition: 2 OR 5/12 -anticipate she will need SNF rehab stay   Objective: Blood pressure (!) 137/59, pulse (!) 59, temperature 98.2 F (36.8 C), temperature source Oral, resp. rate 16, height 5' 5.98" (1.676 m), weight 54.4 kg, SpO2 95 %.  Intake/Output Summary (Last 24 hours) at 10/25/2022 1654 Last data filed at 10/25/2022 1630 Gross  per 24 hour  Intake 600 ml  Output 1500 ml  Net -900 ml   Filed Weights   10/23/22 2329 10/25/22 1443  Weight: 54.4 kg 54.4 kg    Examination: General: No acute respiratory distress Lungs: Clear to auscultation bilaterally without wheezes or  crackles Cardiovascular: Regular rate and without gallop or rub -faint systolic murmur consistent with AoS Abdomen: Nontender, nondistended, soft, bowel sounds positive, no rebound, no ascites, no appreciable mass Extremities: No significant cyanosis, clubbing, or edema bilateral lower extremities -2+ DP pulses bilaterally -right foot warm/well-perfused -able to move her right foot and toes  CBC: Recent Labs  Lab 10/23/22 1339 10/25/22 0415  WBC 5.0 5.1  NEUTROABS 3.3  --   HGB 13.8 14.1  HCT 41.4 42.8  MCV 95.0 94.1  PLT 223 229   Basic Metabolic Panel: Recent Labs  Lab 10/23/22 1339 10/25/22 0415  NA 136 135  K 3.7 3.0*  CL 100 98  CO2 27 28  GLUCOSE 161* 136*  BUN 13 6*  CREATININE 0.55 0.61  CALCIUM 9.2 9.2   GFR: Estimated Creatinine Clearance: 36.9 mL/min (by C-G formula based on SCr of 0.61 mg/dL).   Scheduled Meds:  [MAR Hold] atenolol  25 mg Oral BID   chlorhexidine  60 mL Topical Once   [MAR Hold] insulin aspart  0-9 Units Subcutaneous TID WC   [MAR Hold] isosorbide mononitrate  60 mg Oral BID   [MAR Hold] lisinopril  40 mg Oral BID   [MAR Hold] neomycin-polymyxin b-dexamethasone  1 drop Both Eyes BID   [MAR Hold] potassium chloride  40 mEq Oral Once   Continuous Infusions:  sodium chloride     lactated ringers 10 mL/hr at 10/25/22 1537     LOS: 0 days   Lonia Blood, MD [facility] Office  [phone] Pager - Text Page per Loretha Stapler  If 7PM-7AM, please contact night-coverage per Amion 10/25/2022, 4:54 PM

## 2022-10-25 NOTE — Transfer of Care (Signed)
Immediate Anesthesia Transfer of Care Note  Patient: Leah Holmes  Procedure(s) Performed: INTRAMEDULLARY (IM) NAIL INTERTROCHANTERIC (Right)  Patient Location: PACU  Anesthesia Type:General  Level of Consciousness: awake and drowsy  Airway & Oxygen Therapy: Patient Spontanous Breathing and Patient connected to nasal cannula oxygen  Post-op Assessment: Report given to RN and Post -op Vital signs reviewed and stable  Post vital signs: Reviewed and stable  Last Vitals:  Vitals Value Taken Time  BP 138/76 10/25/22 1652  Temp    Pulse 101 10/25/22 1656  Resp 14 10/25/22 1656  SpO2 72 % 10/25/22 1656  Vitals shown include unvalidated device data.  Last Pain:  Vitals:   10/25/22 1409  TempSrc: Oral  PainSc:          Complications: No notable events documented.

## 2022-10-25 NOTE — Brief Op Note (Signed)
10/23/2022 - 10/25/2022  4:35 PM  PATIENT:  Leah Holmes  89 y.o. female  PRE-OPERATIVE DIAGNOSIS:  Right intertrochanteric hip fracture  POST-OPERATIVE DIAGNOSIS:  Right intertrochanteric hip fracture  PROCEDURE:  Procedure(s): INTRAMEDULLARY (IM) NAIL INTERTROCHANTERIC (Right)  SURGEON:  Surgeon(s) and Role:    Durene Romans, MD - Primary  PHYSICIAN ASSISTANT: Rosalene Billings, PA-C  ANESTHESIA:   general  EBL:  50 mL   BLOOD ADMINISTERED:none  DRAINS: none   LOCAL MEDICATIONS USED:  NONE  SPECIMEN:  No Specimen  DISPOSITION OF SPECIMEN:  N/A  COUNTS:  YES  TOURNIQUET:  * No tourniquets in log *  DICTATION: .Other Dictation: Dictation Number [id number]  PLAN OF CARE: Admit to inpatient   PATIENT DISPOSITION:  PACU - hemodynamically stable.   Delay start of Pharmacological VTE agent (>24hrs) due to surgical blood loss or risk of bleeding: no

## 2022-10-25 NOTE — Discharge Instructions (Signed)

## 2022-10-25 NOTE — Progress Notes (Signed)
Patient off the unit to OR.

## 2022-10-25 NOTE — Progress Notes (Signed)
Patient back to the unit alert but disoriented and impulsive, will continue to monitor

## 2022-10-26 ENCOUNTER — Inpatient Hospital Stay (HOSPITAL_COMMUNITY): Payer: Medicare Other

## 2022-10-26 ENCOUNTER — Other Ambulatory Visit: Payer: Self-pay

## 2022-10-26 ENCOUNTER — Encounter (HOSPITAL_COMMUNITY): Payer: Self-pay | Admitting: Orthopedic Surgery

## 2022-10-26 DIAGNOSIS — R443 Hallucinations, unspecified: Secondary | ICD-10-CM

## 2022-10-26 LAB — BASIC METABOLIC PANEL
Anion gap: 11 (ref 5–15)
BUN: 14 mg/dL (ref 8–23)
CO2: 26 mmol/L (ref 22–32)
Calcium: 8.9 mg/dL (ref 8.9–10.3)
Chloride: 98 mmol/L (ref 98–111)
Creatinine, Ser: 0.69 mg/dL (ref 0.44–1.00)
GFR, Estimated: >60 mL/min (ref >60)
Glucose, Bld: 283 mg/dL — ABNORMAL HIGH (ref 70–99)
Potassium: 3.5 mmol/L (ref 3.5–5.1)
Sodium: 135 mmol/L (ref 135–145)

## 2022-10-26 LAB — CBC
HCT: 34.6 % — ABNORMAL LOW (ref 36.0–46.0)
Hemoglobin: 11.4 g/dL — ABNORMAL LOW (ref 12.0–15.0)
MCH: 31.5 pg (ref 26.0–34.0)
MCHC: 32.9 g/dL (ref 30.0–36.0)
MCV: 95.6 fL (ref 80.0–100.0)
Platelets: 166 10*3/uL (ref 150–400)
RBC: 3.62 MIL/uL — ABNORMAL LOW (ref 3.87–5.11)
RDW: 12.7 % (ref 11.5–15.5)
WBC: 8.3 10*3/uL (ref 4.0–10.5)
nRBC: 0 % (ref 0.0–0.2)

## 2022-10-26 LAB — ECHOCARDIOGRAM COMPLETE
AV Mean grad: 19.6 mmHg
AV Peak grad: 34.1 mmHg
Ao pk vel: 2.92 m/s
Area-P 1/2: 2.95 cm2
Calc EF: 62.4 %
S' Lateral: 2 cm
Single Plane A2C EF: 66.6 %
Single Plane A4C EF: 60.1 %
Weight: 1918.88 oz

## 2022-10-26 LAB — GLUCOSE, CAPILLARY
Glucose-Capillary: 262 mg/dL — ABNORMAL HIGH (ref 70–99)
Glucose-Capillary: 281 mg/dL — ABNORMAL HIGH (ref 70–99)
Glucose-Capillary: 205 mg/dL — ABNORMAL HIGH (ref 70–99)
Glucose-Capillary: 181 mg/dL — ABNORMAL HIGH (ref 70–99)
Glucose-Capillary: 270 mg/dL — ABNORMAL HIGH (ref 70–99)

## 2022-10-26 LAB — MAGNESIUM: Magnesium: 1.5 mg/dL — ABNORMAL LOW (ref 1.7–2.4)

## 2022-10-26 MED ORDER — MAGNESIUM SULFATE 2 GM/50ML IV SOLN
2.0000 g | Freq: Once | INTRAVENOUS | Status: AC
  Administered 2022-10-26: 2 g via INTRAVENOUS
  Filled 2022-10-26: qty 50

## 2022-10-26 MED ORDER — QUETIAPINE FUMARATE 25 MG PO TABS
12.5000 mg | ORAL_TABLET | Freq: Every day | ORAL | Status: DC
  Administered 2022-10-26 – 2022-11-01 (×4): 12.5 mg via ORAL
  Filled 2022-10-26 (×5): qty 1

## 2022-10-26 NOTE — Progress Notes (Signed)
Leah Holmes  AVW:[id number] DOB: 1927/05/02 DOA: 10/23/2022 PCP: Sherlene Shams, MD    Brief Narrative:  89 year old with a history of DM2, CAD, chronic atrial fibrillation on warfarin, HTN, HLD, anxiety/depression, remote treatment for breast cancer, and SSS status post pacemaker placement who presented to the [facility] ER 5/10 with a 1 week history of HA and hallucinations.  She had been evaluated by an Ophthalmologist in the outpatient setting who found no acute issues.  She was transferred to [facility] ER from [place] to undergo an MRI.  CT head at presentation revealed no acute findings.  CXR was unrevealing.  Consultants:  Neurology  Orthopedic Surgery  Goals of Care:  Code Status: DNR   DVT prophylaxis: SCDs for now   Interim Hx: The patient underwent a successful intramedullary nailing of her right proximal femur fracture 10/25/2022 PM.  Vital signs were stable overnight and she was afebrile.  She reportedly had a rough night postoperatively with increased delirium and hallucinations.  She is sound asleep at the time of visit.  I spoke with the daughter at the bedside.  Assessment & Plan:  Mechanical fall in the hospital with resultant comminuted displaced intertrochanteric fracture of the right femur S/p intramedullary nailing 5/12 - postoperative care per Orthopedic Surgery - will discuss timing of coumadin resumption w/ Ortho team   Persisting HA with visual hallucinations Has been evaluated by Neurology - EEG unrevealing - CT head x2 w/o acute findings - carotid Dopplers without evidence of significant stenosis bilaterally - no further inpatient w/u is felt to be indicated, and MRI thought to be low yield in setting of non-focal sx w/ 2 negative CT - Neuro opined this may be the initial onset of Lewy body dementia - give trial of low dose seroquel bid as suggested by Neuro   Chronic atrial fibrillation on warfarin Rate is well-controlled -unclear why patient is not on DOAC  instead of warfarin (despite extensive review of outpatient records) - hold warfarin for now - discuss timing of resumption w/ Orthopedics   Hypokalemia Supplement and monitor trend   Hypomagnesemia Supplement and follow -likely due to poor intake  HTN Avoid overcorrection at present -monitor trend in perioperative period  CAD Diffuse three-vessel CAD by cardiac cath 2012 with critical mid LAD stenosis not amenable to PCI -has declined follow-up testing as she would not wish to pursue intervention -no recent anginal symptoms with exertion  DM2 A1c 6.9 09/07/2022 - CBG reasonably well-controlled presently -monitor trend  SSS status post pacemaker insertion 2017  HLD Continue usual medical therapy  Aortic stenosis Graded as mild via TTE 2022  Family Communication: Spoke with daughter at bedside Disposition: It is anticipated that an SNF rehab stay will be necessary   Objective: Blood pressure (!) 141/82, pulse 77, temperature 98 F (36.7 C), temperature source Oral, resp. rate 16, height 5' 5.98" (1.676 m), weight 54.4 kg, SpO2 96 %.  Intake/Output Summary (Last 24 hours) at 10/26/2022 0944 Last data filed at 10/26/2022 8295 Gross per 24 hour  Intake 1357.48 ml  Output 700 ml  Net 657.48 ml    Filed Weights   10/23/22 2329 10/25/22 1443  Weight: 54.4 kg 54.4 kg    Examination: General: No acute respiratory distress Lungs: Clear to auscultation bilaterally without wheezes or crackles Cardiovascular: Regular rate and without gallop or rub -faint systolic murmur consistent with AoS Abdomen: Nontender, nondistended, soft, bowel sounds positive, no rebound, no ascites, no appreciable mass Extremities: No significant cyanosis, clubbing, or  edema bilateral lower extremities  CBC: Recent Labs  Lab 10/23/22 1339 10/25/22 0415 10/26/22 0414  WBC 5.0 5.1 8.3  NEUTROABS 3.3  --   --   HGB 13.8 14.1 11.4*  HCT 41.4 42.8 34.6*  MCV 95.0 94.1 95.6  PLT 223 229 166     Basic Metabolic Panel: Recent Labs  Lab 10/23/22 1339 10/25/22 0415 10/26/22 0414  NA 136 135 135  K 3.7 3.0* 3.5  CL 100 98 98  CO2 27 28 26   GLUCOSE 161* 136* 283*  BUN 13 6* 14  CREATININE 0.55 0.61 0.69  CALCIUM 9.2 9.2 8.9  MG  --   --  1.5*    GFR: Estimated Creatinine Clearance: 36.9 mL/min (by C-G formula based on SCr of 0.69 mg/dL).   Scheduled Meds:  aspirin  81 mg Oral BID   atenolol  25 mg Oral BID   dexamethasone (DECADRON) injection  10 mg Intravenous Once   docusate sodium  100 mg Oral BID   insulin aspart  0-9 Units Subcutaneous TID WC   isosorbide mononitrate  60 mg Oral BID   lisinopril  40 mg Oral BID   neomycin-polymyxin b-dexamethasone  1 drop Both Eyes BID   polyethylene glycol  17 g Oral BID   potassium chloride  40 mEq Oral Once   Continuous Infusions:  sodium chloride 75 mL/hr at 10/25/22 1938   methocarbamol (ROBAXIN) IV     tranexamic acid       LOS: 1 day   Lonia Blood, MD [facility] Office  [phone] Pager - Text Page per Loretha Stapler  If 7PM-7AM, please contact night-coverage per Amion 10/26/2022, 9:44 AM

## 2022-10-26 NOTE — Progress Notes (Signed)
  Echocardiogram 2D Echocardiogram has been performed.  Janalyn Harder 10/26/2022, 9:29 AM

## 2022-10-26 NOTE — Evaluation (Signed)
Physical Therapy Evaluation Patient Details Name: Leah Holmes MRN: [id number] DOB: 02/26/1927 Today's Date: 10/26/2022  History of Present Illness  Pt is a 89 y.o. female who presented 10/23/22 with left-sided headache behind the eyes associated with visual changes and hallucinations of bright lights and occasional figures. CT head negative for acute or interval finding. Awaiting brain MRI. Course complicated by hospital fall with R femur fracture, s/p IM nail 5/12. PMHx of a-fib on Coumadin, PPM, HTN, left breast cancer s/p radiation and lumpectomy, essential tremor, DM2, CAD, CHF, and IBS  Clinical Impression  Pt admitted with above diagnosis. Pt from home with her daughter where she was using RW for ambulation and had maintained some independence. Pt currently needing mod A +2 for all out of bed mobility due to R hip pain and weakness. Pt and daughter educated on PWB status as well as LE there ex. Pt with difficulty problem solving and sequencing stepping. Patient will benefit from continued inpatient follow up therapy, <3 hours/day. Pt currently with functional limitations due to the deficits listed below (see PT Problem List). Pt will benefit from acute skilled PT to increase their independence and safety with mobility to allow discharge.          Recommendations for follow up therapy are one component of a multi-disciplinary discharge planning process, led by the attending physician.  Recommendations may be updated based on patient status, additional functional criteria and insurance authorization.  Follow Up Recommendations Can patient physically be transported by private vehicle: No     Assistance Recommended at Discharge Frequent or constant Supervision/Assistance  Patient can return home with the following  Two people to help with walking and/or transfers;A lot of help with bathing/dressing/bathroom;Assistance with cooking/housework;Direct supervision/assist for medications  management;Direct supervision/assist for financial management;Assist for transportation;Help with stairs or ramp for entrance    Equipment Recommendations None recommended by PT  Recommendations for Other Services       Functional Status Assessment Patient has had a recent decline in their functional status and demonstrates the ability to make significant improvements in function in a reasonable and predictable amount of time.     Precautions / Restrictions Precautions Precautions: Fall Restrictions Weight Bearing Restrictions: Yes RLE Weight Bearing: Partial weight bearing RLE Partial Weight Bearing Percentage or Pounds: 50      Mobility  Bed Mobility Overal bed mobility: Needs Assistance Bed Mobility: Supine to Sit     Supine to sit: Max assist, +2 for physical assistance, +2 for safety/equipment     General bed mobility comments: cueing for technique and assist to manage LEs (R >L), scooting and trunk to upright at EOB    Transfers Overall transfer level: Needs assistance Equipment used: Rolling walker (2 wheels) Transfers: Sit to/from Stand, Bed to chair/wheelchair/BSC Sit to Stand: Min assist, +2 physical assistance, +2 safety/equipment Stand pivot transfers: Mod assist, +2 physical assistance, +2 safety/equipment         General transfer comment: able to power up pushing with L UE on bed and min assist +2, pvioting towards L side with increased assist for sequencing, steadying and RW mgmt    Ambulation/Gait Ambulation/Gait assistance: Mod assist, +2 physical assistance Gait Distance (Feet): 2 Feet Assistive device: Rolling walker (2 wheels) Gait Pattern/deviations: Step-to pattern, Decreased weight shift to right, Antalgic Gait velocity: decreased Gait velocity interpretation: <1.31 ft/sec, indicative of household ambulator   General Gait Details: took 4 steps fwd/ back then took pivot steps to chair with mod A +2, had an  easier time stepping straight fwd  than pivoting feet.  Stairs            Wheelchair Mobility    Modified Rankin (Stroke Patients Only)       Balance Overall balance assessment: Needs assistance Sitting-balance support: No upper extremity supported, Feet supported, Bilateral upper extremity supported Sitting balance-[name]: Fair Sitting balance - Comments: preference to BUE support   Standing balance support: Bilateral upper extremity supported, During functional activity Standing balance-[name]: Poor Standing balance comment: relies on BUE and external support                             Pertinent Vitals/Pain Pain Assessment Faces Pain Scale: Hurts even more Breathing: normal Negative Vocalization: none Facial Expression: smiling or inexpressive Body Language: relaxed Consolability: no need to console PAINAD Score: 0 Pain Location: R hip with mobility Pain Descriptors / Indicators: Discomfort, Guarding    Home Living Family/patient expects to be discharged to:: Private residence Living Arrangements: Children Available Help at Discharge: Family;Available 24 hours/day Type of Home: House Home Access: Stairs to enter Entrance Stairs-Rails:  (at garage) Entrance Stairs-Number of Steps: 1   Home Layout: Multi-level;Able to live on main level with bedroom/bathroom Home Equipment: Rollator (4 wheels);Tub bench;Grab bars - tub/shower;Grab bars - toilet;Wheelchair - manual;Toilet riser (life alert) Additional Comments: lives with daughter    Prior Function Prior Level of Function : Needs assist             Mobility Comments: ambulates with rollator, assist only on step ADLs Comments: was able to dress self and make bed but needed assist with bathing, daughter helped into showerr once/wk. Does her own meds and usually her own laundry     Hand Dominance   Dominant Hand: Right    Extremity/Trunk Assessment   Upper Extremity Assessment Upper Extremity Assessment: Defer to  OT evaluation    Lower Extremity Assessment Lower Extremity Assessment: Generalized weakness;RLE deficits/detail RLE Deficits / Details: hip flex 1/5, knee ext 2+/5 RLE: Unable to fully assess due to pain RLE Sensation: decreased proprioception RLE Coordination: decreased gross motor    Cervical / Trunk Assessment Cervical / Trunk Assessment: Kyphotic  Communication   Communication: HOH  Cognition Arousal/Alertness: Awake/alert Behavior During Therapy: Flat affect Overall Cognitive Status: Impaired/Different from baseline Area of Impairment: Memory, Following commands, Safety/judgement, Awareness, Problem solving, Attention                   Current Attention Level: Sustained Memory: Decreased recall of precautions, Decreased short-term memory Following Commands: Follows one step commands consistently, Follows one step commands with increased time, Follows multi-step commands inconsistently Safety/Judgement: Decreased awareness of safety, Decreased awareness of deficits Awareness: Emergent Problem Solving: Slow processing, Requires verbal cues, Requires tactile cues, Difficulty sequencing, Decreased initiation General Comments: pt asleep upon entry but alert once awakened.  She initally requires re-orientation to situation of hospital admission (headaches) but able to recall fall and hip surgery without assist.  She requires cueing to recall hand placement, sequence and problem solve during session.        General Comments General comments (skin integrity, edema, etc.): daughter present and supportive. Pt's dentures very loose but she prefers to keep them in    Exercises General Exercises - Lower Extremity Ankle Circles/Pumps: AROM, Both, 10 reps, Seated Quad Sets: AROM, Both, 10 reps, Seated   Assessment/Plan    PT Assessment Patient needs continued PT services  PT  Problem List Decreased strength;Decreased range of motion;Decreased activity tolerance;Decreased  balance;Decreased mobility;Decreased coordination;Decreased cognition;Pain;Decreased knowledge of use of DME;Decreased safety awareness;Decreased knowledge of precautions       PT Treatment Interventions DME instruction;Gait training;Functional mobility training;Therapeutic activities;Therapeutic exercise;Balance training;Neuromuscular re-education;Cognitive remediation;Patient/family education    PT Goals (Current goals can be found in the Care Plan section)  Acute Rehab PT Goals Patient Stated Goal: daughter agreeable to SNF PT Goal Formulation: With patient Time For Goal Achievement: 11/09/22 Potential to Achieve Goals: Fair    Frequency Min 4X/week     Co-evaluation PT/OT/SLP Co-Evaluation/Treatment: Yes Reason for Co-Treatment: To address functional/ADL transfers;For patient/therapist safety;Necessary to address cognition/behavior during functional activity PT goals addressed during session: Mobility/safety with mobility;Balance;Proper use of DME;Strengthening/ROM OT goals addressed during session: ADL's and self-care       AM-PAC PT "6 Clicks" Mobility  Outcome Measure Help needed turning from your back to your side while in a flat bed without using bedrails?: A Lot Help needed moving from lying on your back to sitting on the side of a flat bed without using bedrails?: Total Help needed moving to and from a bed to a chair (including a wheelchair)?: Total Help needed standing up from a chair using your arms (e.g., wheelchair or bedside chair)?: Total Help needed to walk in hospital room?: Total Help needed climbing 3-5 steps with a railing? : Total 6 Click Score: 7    End of Session Equipment Utilized During Treatment: Gait belt Activity Tolerance: Patient tolerated treatment well Patient left: in chair;with call bell/phone within reach;with family/visitor present;with chair alarm set Nurse Communication: Mobility status PT Visit Diagnosis: Unsteadiness on feet  (R26.81);Muscle weakness (generalized) (M62.81);Difficulty in walking, not elsewhere classified (R26.2);Pain Pain - Right/Left: Right Pain - part of body: Hip    Time: 1610-9604 PT Time Calculation (min) (ACUTE ONLY): 34 min   Charges:   PT Evaluation $PT Eval Moderate Complexity: 1 Mod          Lyanne Co, PT  Acute Rehab Services Secure chat preferred Office [phone]   Lawana Chambers Ronika Kelson 10/26/2022, 3:57 PM

## 2022-10-26 NOTE — Evaluation (Signed)
Occupational Therapy Evaluation Patient Details Name: Leah Holmes MRN: [id number] DOB: 1926/11/30 Today's Date: 10/26/2022   History of Present Illness Pt is a 89 y.o. female who presented 10/23/22 with left-sided headache behind the eyes associated with visual changes and hallucinations of bright lights and occasional figures. CT head negative for acute or interval finding. Awaiting brain MRI. Course complicated by hospital fall with R femur fracture, s/p IM nail 5/12. PMHx of a-fib on Coumadin, PPM, HTN, left breast cancer s/p radiation and lumpectomy, essential tremor, DM2, CAD, CHF, and IBS   Clinical Impression   PTA patient independent with ADLs, using rollator for mobility and managing medications; she lives with daughter. Admitted for above and presents with problem list below. Today, patient requires max assist +2 for bed mobility, mod assist +2 for stand pivot transfers to recliner using RW and setup to total assist +2 for ADLs.  She is limited by R hip pain, cognition, impaired balance and decreased activity tolerance. Educated on safety and PWB to R LE at 50%.  Pt with poor recall to precautions, poor sequencing and problem solving.  Based on performance today, believe patient will best benefit from continued OT services acutely and after dc at inpatient setting with <3hrs/day to optimize independence, safety with ADLs and mobility.    Recommendations for follow up therapy are one component of a multi-disciplinary discharge planning process, led by the attending physician.  Recommendations may be updated based on patient status, additional functional criteria and insurance authorization.   Assistance Recommended at Discharge Frequent or constant Supervision/Assistance  Patient can return home with the following Two people to help with walking and/or transfers;A lot of help with bathing/dressing/bathroom;Assistance with cooking/housework;Direct supervision/assist for medications  management;Direct supervision/assist for financial management;Assist for transportation;Help with stairs or ramp for entrance    Functional Status Assessment  Patient has had a recent decline in their functional status and demonstrates the ability to make significant improvements in function in a reasonable and predictable amount of time.  Equipment Recommendations  Other (comment) (defer)    Recommendations for Other Services       Precautions / Restrictions Precautions Precautions: Fall Restrictions Weight Bearing Restrictions: Yes RLE Weight Bearing: Partial weight bearing RLE Partial Weight Bearing Percentage or Pounds: 50      Mobility Bed Mobility Overal bed mobility: Needs Assistance Bed Mobility: Supine to Sit     Supine to sit: Max assist, +2 for physical assistance, +2 for safety/equipment     General bed mobility comments: cueing for technique and assist to manage LEs (R >L), scooting and trunk to upright at EOB    Transfers Overall transfer level: Needs assistance Equipment used: Rolling walker (2 wheels) Transfers: Sit to/from Stand, Bed to chair/wheelchair/BSC Sit to Stand: Min assist, +2 physical assistance, +2 safety/equipment Stand pivot transfers: Mod assist, +2 physical assistance, +2 safety/equipment         General transfer comment: able to power up pushing with L UE on bed and min assist +2, pvioting towards L side with increased assist for sequencing, steadying and RW mgmt      Balance Overall balance assessment: Needs assistance Sitting-balance support: No upper extremity supported, Feet supported, Bilateral upper extremity supported Sitting balance-[name]: Fair Sitting balance - Comments: preference to BUE support   Standing balance support: Bilateral upper extremity supported, During functional activity Standing balance-[name]: Poor Standing balance comment: relies on BUE and external support  ADL either performed or assessed with clinical judgement   ADL Overall ADL's : Needs assistance/impaired     Grooming: Set up;Sitting           Upper Body Dressing : Minimal assistance;Sitting   Lower Body Dressing: Total assistance;+2 for physical assistance;+2 for safety/equipment;Sit to/from stand   Toilet Transfer: Moderate assistance;+2 for physical assistance;+2 for safety/equipment;Stand-pivot;Rolling walker (2 wheels) Toilet Transfer Details (indicate cue type and reason): simulated to recliner         Functional mobility during ADLs: Minimal assistance;Maximal assistance;Moderate assistance;+2 for physical assistance;+2 for safety/equipment;Rolling walker (2 wheels)       Vision   Vision Assessment?: No apparent visual deficits     Perception     Praxis      Pertinent Vitals/Pain Pain Assessment Pain Assessment: Faces Faces Pain Scale: Hurts even more Pain Location: R hip with mobility Pain Descriptors / Indicators: Discomfort, Guarding Pain Intervention(s): Monitored during session, Limited activity within patient's tolerance, Repositioned     Hand Dominance Right   Extremity/Trunk Assessment Upper Extremity Assessment Upper Extremity Assessment: Generalized weakness (baseline tremors)   Lower Extremity Assessment Lower Extremity Assessment: Defer to PT evaluation       Communication Communication Communication: HOH   Cognition Arousal/Alertness: Awake/alert Behavior During Therapy: Flat affect Overall Cognitive Status: Impaired/Different from baseline Area of Impairment: Memory, Following commands, Safety/judgement, Awareness, Problem solving, Attention                   Current Attention Level: Sustained Memory: Decreased recall of precautions, Decreased short-term memory Following Commands: Follows one step commands consistently, Follows one step commands with increased time, Follows multi-step commands  inconsistently Safety/Judgement: Decreased awareness of safety, Decreased awareness of deficits Awareness: Emergent Problem Solving: Slow processing, Requires verbal cues, Requires tactile cues, Difficulty sequencing, Decreased initiation General Comments: pt asleep upon entry but alert once awakened.  She initally requires re-orientation to situation of hospital admission (headaches) but able to recall fall and hip surgery without assist.  She requires cueing to recall hand placement, sequence and problem solve during session.     General Comments  daughter at side and supportive    Exercises     Shoulder Instructions      Home Living Family/patient expects to be discharged to:: Private residence Living Arrangements: Children Available Help at Discharge: Family;Available 24 hours/day Type of Home: House Home Access: Stairs to enter [employer] of Steps: 1         Bathroom Shower/Tub: Tub/shower unit         Home Equipment: Rollator (4 wheels);Tub bench;Grab bars - tub/shower;Grab bars - toilet;Wheelchair - manual;Toilet riser (life alert)   Additional Comments: lives with daughter      Prior Functioning/Environment Prior Level of Function : Needs assist             Mobility Comments: ambulates with rollator, assist only on step ADLs Comments: was able to dress self and make bed but needed assist with bathing, daughter helped into showerr once/wk. Does her own meds and usually her own laundry        OT Problem List: Decreased strength;Decreased activity tolerance;Impaired balance (sitting and/or standing);Pain;Decreased knowledge of precautions;Decreased knowledge of use of DME or AE;Decreased safety awareness;Decreased cognition      OT Treatment/Interventions: Self-care/ADL training;Therapeutic exercise;DME and/or AE instruction;Therapeutic activities;Patient/family education;Balance training;Cognitive remediation/compensation    OT Goals(Current  goals can be found in the care plan section) Acute Rehab OT Goals Patient Stated Goal: less pain OT Goal  Formulation: With patient Time For Goal Achievement: 11/09/22 Potential to Achieve Goals: Good  OT Frequency: Min 2X/week    Co-evaluation PT/OT/SLP Co-Evaluation/Treatment: Yes Reason for Co-Treatment: To address functional/ADL transfers;For patient/therapist safety;Necessary to address cognition/behavior during functional activity   OT goals addressed during session: ADL's and self-care      AM-PAC OT "6 Clicks" Daily Activity     Outcome Measure Help from another person eating meals?: A Little Help from another person taking care of personal grooming?: Total Help from another person toileting, which includes using toliet, bedpan, or urinal?: A Lot Help from another person bathing (including washing, rinsing, drying)?: A Lot Help from another person to put on and taking off regular upper body clothing?: A Little Help from another person to put on and taking off regular lower body clothing?: Total 6 Click Score: 12   End of Session Equipment Utilized During Treatment: Rolling walker (2 wheels);Gait belt Nurse Communication: Mobility status  Activity Tolerance: Patient tolerated treatment well Patient left: in chair;with call bell/phone within reach;with chair alarm set;with family/visitor present  OT Visit Diagnosis: Other abnormalities of gait and mobility (R26.89);Muscle weakness (generalized) (M62.81);Pain;Other symptoms and signs involving cognitive function;History of falling (Z91.81) Pain - Right/Left: Right Pain - part of body: Leg;Hip                Time: 1610-9604 OT Time Calculation (min): 33 min Charges:  OT General Charges $OT Visit: 1 Visit OT Evaluation $OT Eval Moderate Complexity: 1 Mod  Barry Brunner, OT Acute Rehabilitation Services Office [phone]   Chancy Milroy 10/26/2022, 1:55 PM

## 2022-10-26 NOTE — Anesthesia Postprocedure Evaluation (Signed)
Anesthesia Post Note  Patient: Leah Holmes  Procedure(s) Performed: INTRAMEDULLARY (IM) NAIL INTERTROCHANTERIC (Right)     Patient location during evaluation: PACU Anesthesia Type: General Level of consciousness: awake and alert Pain management: pain level controlled Vital Signs Assessment: post-procedure vital signs reviewed and stable Respiratory status: spontaneous breathing, nonlabored ventilation, respiratory function stable and patient connected to nasal cannula oxygen Cardiovascular status: blood pressure returned to baseline and stable Postop Assessment: no apparent nausea or vomiting Anesthetic complications: no   No notable events documented.  Last Vitals:  Vitals:   10/26/22 0331 10/26/22 0742  BP: 137/68 (!) 141/82  Pulse: 69 77  Resp: 17 16  Temp: 37.1 C 36.7 C  SpO2: 95% 96%    Last Pain:  Vitals:   10/26/22 0742  TempSrc: Oral  PainSc:                  Collene Schlichter

## 2022-10-27 DIAGNOSIS — R443 Hallucinations, unspecified: Secondary | ICD-10-CM | POA: Diagnosis not present

## 2022-10-27 LAB — PROTIME-INR
INR: 2.7 — ABNORMAL HIGH (ref 0.8–1.2)
Prothrombin Time: 29.3 seconds — ABNORMAL HIGH (ref 11.4–15.2)

## 2022-10-27 LAB — GLUCOSE, CAPILLARY
Glucose-Capillary: 228 mg/dL — ABNORMAL HIGH (ref 70–99)
Glucose-Capillary: 206 mg/dL — ABNORMAL HIGH (ref 70–99)
Glucose-Capillary: 166 mg/dL — ABNORMAL HIGH (ref 70–99)
Glucose-Capillary: 185 mg/dL — ABNORMAL HIGH (ref 70–99)

## 2022-10-27 LAB — BASIC METABOLIC PANEL
Anion gap: 6 (ref 5–15)
BUN: 23 mg/dL (ref 8–23)
CO2: 27 mmol/L (ref 22–32)
Calcium: 8.8 mg/dL — ABNORMAL LOW (ref 8.9–10.3)
Chloride: 100 mmol/L (ref 98–111)
Creatinine, Ser: 0.65 mg/dL (ref 0.44–1.00)
GFR, Estimated: >60 mL/min (ref >60)
Glucose, Bld: 205 mg/dL — ABNORMAL HIGH (ref 70–99)
Potassium: 3.5 mmol/L (ref 3.5–5.1)
Sodium: 133 mmol/L — ABNORMAL LOW (ref 135–145)

## 2022-10-27 LAB — CBC
HCT: 32.4 % — ABNORMAL LOW (ref 36.0–46.0)
Hemoglobin: 11.1 g/dL — ABNORMAL LOW (ref 12.0–15.0)
MCH: 32.1 pg (ref 26.0–34.0)
MCHC: 34.3 g/dL (ref 30.0–36.0)
MCV: 93.6 fL (ref 80.0–100.0)
Platelets: 190 10*3/uL (ref 150–400)
RBC: 3.46 MIL/uL — ABNORMAL LOW (ref 3.87–5.11)
RDW: 13.2 % (ref 11.5–15.5)
WBC: 10.7 10*3/uL — ABNORMAL HIGH (ref 4.0–10.5)
nRBC: 0 % (ref 0.0–0.2)

## 2022-10-27 LAB — MAGNESIUM: Magnesium: 2.1 mg/dL (ref 1.7–2.4)

## 2022-10-27 MED ORDER — WARFARIN - PHARMACIST DOSING INPATIENT: Freq: Every day | Status: DC

## 2022-10-27 MED ORDER — LABETALOL HCL 5 MG/ML IV SOLN
5.0000 mg | INTRAVENOUS | Status: DC | PRN
  Administered 2022-10-27 – 2022-10-29 (×2): 5 mg via INTRAVENOUS
  Filled 2022-10-27 (×2): qty 4

## 2022-10-27 MED ORDER — WARFARIN SODIUM 2 MG PO TABS
2.0000 mg | ORAL_TABLET | Freq: Once | ORAL | Status: AC
  Administered 2022-10-27: 2 mg via ORAL
  Filled 2022-10-27: qty 1

## 2022-10-27 NOTE — Progress Notes (Signed)
Physical Therapy Treatment Patient Details Name: Leah Holmes MRN: [id number] DOB: 08/01/1926 Today's Date: 10/27/2022   History of Present Illness Pt is a 89 y.o. female who presented 10/23/22 with left-sided headache behind the eyes associated with visual changes and hallucinations of bright lights and occasional figures. CT head negative for acute or interval finding. Awaiting brain MRI. Course complicated by hospital fall with R femur fracture, s/p IM nail 5/12. PMHx of a-fib on Coumadin, PPM, HTN, left breast cancer s/p radiation and lumpectomy, essential tremor, DM2, CAD, CHF, and IBS. Lewy Body dementia    PT Comments    Pt sleepy today but arouses easily and remains awake when stimulated, quickly falls back asleep if not active or talking. Max A needed to come to EOB. Pt stood with mod A on R and +2 on L for safety. Worked on fwd mobility. Pt with initial posterior lean but improved with stepping and guidance of RW fwd. Pt ambulated fwd 4' with mod A and +2 for safety. Assisted with LE exercises in sitting. Daughter present and discussed ways to stimulate pt to be awake during the day. Patient will benefit from continued inpatient follow up therapy, <3 hours/day. PT will continue to follow.    Recommendations for follow up therapy are one component of a multi-disciplinary discharge planning process, led by the attending physician.  Recommendations may be updated based on patient status, additional functional criteria and insurance authorization.  Follow Up Recommendations  Can patient physically be transported by private vehicle: No    Assistance Recommended at Discharge Frequent or constant Supervision/Assistance  Patient can return home with the following Two people to help with walking and/or transfers;A lot of help with bathing/dressing/bathroom;Assistance with cooking/housework;Direct supervision/assist for medications management;Direct supervision/assist for financial  management;Assist for transportation;Help with stairs or ramp for entrance   Equipment Recommendations  None recommended by PT    Recommendations for Other Services       Precautions / Restrictions Precautions Precautions: Fall Restrictions Weight Bearing Restrictions: Yes RLE Weight Bearing: Partial weight bearing RLE Partial Weight Bearing Percentage or Pounds: 50     Mobility  Bed Mobility Overal bed mobility: Needs Assistance Bed Mobility: Supine to Sit     Supine to sit: Max assist     General bed mobility comments: pt able to reach across body with LUE to grasp rail and manage upper body when coming to R. Max A to RLE and full wt shift from R to L into upright sitting    Transfers Overall transfer level: Needs assistance Equipment used: Rolling walker (2 wheels) Transfers: Sit to/from Stand Sit to Stand: +2 safety/equipment, Mod assist           General transfer comment: mod A on R side to power up, +2 on L for safety    Ambulation/Gait Ambulation/Gait assistance: Mod assist, +2 safety/equipment Gait Distance (Feet): 4 Feet Assistive device: Rolling walker (2 wheels) Gait Pattern/deviations: Step-to pattern, Decreased weight shift to right, Antalgic Gait velocity: decreased Gait velocity interpretation: <1.31 ft/sec, indicative of household ambulator   General Gait Details: pt with posterior lean in standing initially, improved with assist in sliding RW fwd. Mod A and vc's needed for taking steps fwd. Chair brought behind pt.   Stairs             Wheelchair Mobility    Modified Rankin (Stroke Patients Only)       Balance Overall balance assessment: Needs assistance Sitting-balance support: No upper extremity supported, Feet supported,  Bilateral upper extremity supported Sitting balance-[name]: Fair Sitting balance - Comments: preference to BUE support Postural control: Posterior lean, Left lateral lean Standing balance support:  Bilateral upper extremity supported, During functional activity Standing balance-[name]: Poor Standing balance comment: relies on BUE and external support                            Cognition Arousal/Alertness: Awake/alert Behavior During Therapy: Flat affect Overall Cognitive Status: Impaired/Different from baseline Area of Impairment: Memory, Following commands, Safety/judgement, Awareness, Problem solving, Attention                   Current Attention Level: Sustained Memory: Decreased recall of precautions, Decreased short-term memory Following Commands: Follows one step commands consistently, Follows one step commands with increased time, Follows multi-step commands inconsistently Safety/Judgement: Decreased awareness of safety, Decreased awareness of deficits Awareness: Emergent Problem Solving: Slow processing, Requires verbal cues, Requires tactile cues, Difficulty sequencing, Decreased initiation General Comments: pt asleep upon entry but alert once awakened. Slowed processing and also [facility]        Exercises General Exercises - Lower Extremity Ankle Circles/Pumps: AROM, Both, 10 reps, Seated Long Arc Quad: AROM, Right, 10 reps, Seated Hip ABduction/ADduction: AAROM, Right, 5 reps, Supine    General Comments General comments (skin integrity, edema, etc.): daughter present      Pertinent Vitals/Pain Pain Assessment Pain Assessment: Faces Faces Pain Scale: Hurts even more Pain Location: R hip with mobility Pain Descriptors / Indicators: Discomfort, Guarding Pain Intervention(s): Limited activity within patient's tolerance, Monitored during session    Home Living                          Prior Function            PT Goals (current goals can now be found in the care plan section) Acute Rehab PT Goals Patient Stated Goal: daughter agreeable to SNF PT Goal Formulation: With patient Time For Goal Achievement: 11/09/22 Potential to  Achieve Goals: Fair Progress towards PT goals: Progressing toward goals    Frequency    Min 3X/week      PT Plan Current plan remains appropriate;Frequency needs to be updated    Co-evaluation              AM-PAC PT "6 Clicks" Mobility   Outcome Measure  Help needed turning from your back to your side while in a flat bed without using bedrails?: A Lot Help needed moving from lying on your back to sitting on the side of a flat bed without using bedrails?: A Lot Help needed moving to and from a bed to a chair (including a wheelchair)?: A Lot Help needed standing up from a chair using your arms (e.g., wheelchair or bedside chair)?: A Lot Help needed to walk in hospital room?: A Lot Help needed climbing 3-5 steps with a railing? : Total 6 Click Score: 11    End of Session Equipment Utilized During Treatment: Gait belt Activity Tolerance: Patient tolerated treatment well Patient left: in chair;with call bell/phone within reach;with family/visitor present;with chair alarm set Nurse Communication: Mobility status PT Visit Diagnosis: Unsteadiness on feet (R26.81);Muscle weakness (generalized) (M62.81);Difficulty in walking, not elsewhere classified (R26.2);Pain Pain - Right/Left: Right Pain - part of body: Hip     Time: 1204-1228 PT Time Calculation (min) (ACUTE ONLY): 24 min  Charges:  $Gait Training: 8-22 mins $Therapeutic Activity: 8-22 mins  Lyanne Co, PT  Acute Rehab Services Secure chat preferred Office [phone]    Elyse Hsu 10/27/2022, 1:43 PM

## 2022-10-27 NOTE — Plan of Care (Signed)
  Problem: Education: Goal: Ability to describe self-care measures that may prevent or decrease complications (Diabetes Survival Skills Education) will improve Outcome: Progressing Goal: Individualized Educational Video(s) Outcome: Progressing   Problem: Coping: Goal: Ability to adjust to condition or change in health will improve Outcome: Progressing   Problem: Fluid Volume: Goal: Ability to maintain a balanced intake and output will improve Outcome: Progressing   Problem: Health Behavior/Discharge Planning: Goal: Ability to identify and utilize available resources and services will improve Outcome: Progressing Goal: Ability to manage health-related needs will improve Outcome: Progressing   Problem: Metabolic: Goal: Ability to maintain appropriate glucose levels will improve Outcome: Progressing   Problem: Nutritional: Goal: Maintenance of adequate nutrition will improve Outcome: Progressing Goal: Progress toward achieving an optimal weight will improve Outcome: Progressing   Problem: Skin Integrity: Goal: Risk for impaired skin integrity will decrease Outcome: Progressing   Problem: Tissue Perfusion: Goal: Adequacy of tissue perfusion will improve Outcome: Progressing   Problem: Education: Goal: Knowledge of General Education information will improve Description: Including pain rating scale, medication(s)/side effects and non-pharmacologic comfort measures Outcome: Progressing   Problem: Health Behavior/Discharge Planning: Goal: Ability to manage health-related needs will improve Outcome: Progressing   Problem: Clinical Measurements: Goal: Ability to maintain clinical measurements within normal limits will improve Outcome: Progressing Goal: Will remain free from infection Outcome: Progressing Goal: Diagnostic test results will improve Outcome: Progressing Goal: Respiratory complications will improve Outcome: Progressing Goal: Cardiovascular complication will  be avoided Outcome: Progressing   Problem: Activity: Goal: Risk for activity intolerance will decrease Outcome: Progressing   Problem: Nutrition: Goal: Adequate nutrition will be maintained Outcome: Progressing   Problem: Coping: Goal: Level of anxiety will decrease Outcome: Progressing   Problem: Elimination: Goal: Will not experience complications related to bowel motility Outcome: Progressing Goal: Will not experience complications related to urinary retention Outcome: Progressing   Problem: Pain Managment: Goal: General experience of comfort will improve Outcome: Progressing   Problem: Safety: Goal: Ability to remain free from injury will improve Outcome: Progressing   Problem: Skin Integrity: Goal: Risk for impaired skin integrity will decrease Outcome: Progressing   Problem: Education: Goal: Knowledge of the prescribed therapeutic regimen will improve Outcome: Progressing Goal: Understanding of discharge needs will improve Outcome: Progressing Goal: Individualized Educational Video(s) Outcome: Progressing   Problem: Activity: Goal: Ability to avoid complications of mobility impairment will improve Outcome: Progressing Goal: Ability to tolerate increased activity will improve Outcome: Progressing   Problem: Clinical Measurements: Goal: Postoperative complications will be avoided or minimized Outcome: Progressing   Problem: Pain Management: Goal: Pain level will decrease with appropriate interventions Outcome: Progressing   Problem: Skin Integrity: Goal: Will show signs of wound healing Outcome: Progressing   

## 2022-10-27 NOTE — Progress Notes (Signed)
Subjective: 2 Days Post-Op Procedure(s) (LRB): INTRAMEDULLARY (IM) NAIL INTERTROCHANTERIC (Right) Patient reports pain as mild.   Patient seen in rounds for Dr. Charlann Boxer. Patient is resting in bed on exam this morning. She is alert, but not very conversational. She did not get out of bed much yesterday with therapy.  We will continue therapy today.   Objective: Vital signs in last 24 hours: Temp:  [97.5 F (36.4 C)-98.7 F (37.1 C)] 97.6 F (36.4 C) (05/14 0351) Pulse Rate:  [63-77] 63 (05/14 0351) Resp:  [16-20] 20 (05/14 0351) BP: (136-193)/(57-89) 153/61 (05/14 0351) SpO2:  [96 %-97 %] 96 % (05/14 0351)  Intake/Output from previous day:  Intake/Output Summary (Last 24 hours) at 10/27/2022 0714 Last data filed at 10/27/2022 0422 Gross per 24 hour  Intake 341.82 ml  Output 500 ml  Net -158.18 ml     Intake/Output this shift: No intake/output data recorded.  Labs: Recent Labs    10/25/22 0415 10/26/22 0414  HGB 14.1 11.4*   Recent Labs    10/25/22 0415 10/26/22 0414  WBC 5.1 8.3  RBC 4.55 3.62*  HCT 42.8 34.6*  PLT 229 166   Recent Labs    10/25/22 0415 10/26/22 0414  NA 135 135  K 3.0* 3.5  CL 98 98  CO2 28 26  BUN 6* 14  CREATININE 0.61 0.69  GLUCOSE 136* 283*  CALCIUM 9.2 8.9   Recent Labs    10/24/22 1300 10/25/22 0415  INR 2.0* 2.1*    Exam: General - Patient is Alert Extremity - Neurologically intact Intact pulses distally Dorsiflexion/Plantar flexion intact Dressing - dressing C/D/I Motor Function - intact, moving foot and toes well on exam.   Past Medical History:  Diagnosis Date   Atrial fibrillation (HCC) 2012   Breast cancer (HCC) 04/2018   IDC of left breast    CHF (congestive heart failure) (HCC)    Colon polyp 2003   Compression fracture of L2 lumbar vertebra (HCC)    Coronary artery disease    COVID-19 virus infection 12/25/2019   Diabetes mellitus without complication (HCC)    Dyspnea    Dysrhythmia    atrial fib    Environmental and seasonal allergies    GERD (gastroesophageal reflux disease)    Hyperlipidemia    Hypertension    Hypertensive urgency 03/10/2021   Irritable bowel syndrome    Multiple rib fractures 03/15/2018   Personal history of radiation therapy    Polyp of colon    PONV (postoperative nausea and vomiting)    with ether, not recently "woke up in surgery once"   Presence of permanent cardiac pacemaker    Tremor    Tremors of nervous system    Ulcer     Assessment/Plan: 2 Days Post-Op Procedure(s) (LRB): INTRAMEDULLARY (IM) NAIL INTERTROCHANTERIC (Right) Principal Problem:   Intractable headache Active Problems:   CAD (coronary artery disease)   DM type 2 with diabetic peripheral neuropathy (HCC)   Hyperlipidemia associated with type 2 diabetes mellitus (HCC)   Essential hypertension, benign   Chronic atrial fibrillation (HCC)   Other malaise   Sick sinus syndrome (HCC)   Hallucination, visual   Aortic stenosis   Hip fracture (HCC)  Estimated body mass index is 19.37 kg/m as calculated from the following:   Height as of this encounter: 5' 5.98" (1.676 m).   Weight as of this encounter: 54.4 kg. Advance diet Up with therapy  DVT Prophylaxis - She may transition back to Warfarin today -  will place pharmacy consult  PWB RLE  Disposition per PT recommendations and primary team Follow up with [place] in 2 weeks in the office Dressing may get wet, should be left in place unless soiled  Dennie Bible, PA-C Orthopedic Surgery [phone] 10/27/2022, 7:14 AM

## 2022-10-27 NOTE — NC FL2 (Signed)
[place] MEDICAID FL2 LEVEL OF CARE FORM     IDENTIFICATION  Patient Name: Leah Holmes Birthdate: 12/04/1926 Sex: female Admission Date (Current Location): 10/23/2022  [facility] and [place] Number:  Chiropodist and Address:  [facility], [address], [place], [place] [address]      Provider Number: [id number]  Attending Physician Name and Address:  Lonia Blood, MD  Relative Name and Phone Number:       Current Level of Care: Hospital Recommended Level of Care: Skilled Nursing Facility Prior Approval Number:    Date Approved/Denied:   PASRR Number: [id number]  Discharge Plan: SNF    Current Diagnoses: Patient Active Problem List   Diagnosis Date Noted   Hip fracture (HCC) 10/25/2022   Intractable headache 10/24/2022   Hallucination, visual 10/24/2022   Aortic stenosis 10/24/2022   Acute nonintractable headache 10/23/2022   Hallucination 10/23/2022   Allergic rhinitis 08/11/2022   Hypomagnesemia 06/11/2022   Hospital discharge follow-up 06/11/2022   Generalized weakness 05/21/2022   Rib pain on right side 04/08/2022   Left foot pain 04/08/2022   Counseling regarding end of life decision making 11/01/2021   Glucosuria 09/01/2021   Vitamin D deficiency 05/27/2021   History of COVID-19 04/24/2021   QT prolongation    Back pain 03/10/2021   Myalgia due to statin 09/15/2020   Acquired thrombophilia (HCC) 12/25/2019   S/P placement of cardiac pacemaker 09/08/2019   Invasive ductal carcinoma of breast, female, left (HCC) 04/26/2018   Bilateral carotid artery stenosis 04/20/2018   Aortic atherosclerosis (HCC) 03/15/2018   Balance problem 03/15/2018   History of fall 03/15/2018   Intention tremor 04/15/2017   Osteoporosis 03/21/2016   Sick sinus syndrome (HCC) 01/28/2016   Other malaise 11/01/2015   Constipation 07/27/2015   Chronic atrial fibrillation (HCC) 08/16/2014   Medicare annual wellness visit,  subsequent 05/23/2014   Moderate mitral insufficiency 05/08/2014   Moderate tricuspid insufficiency 05/08/2014   Dry mouth 03/10/2014   Low serum vitamin D 02/16/2014   LVH (left ventricular hypertrophy) 11/23/2013   Anal polyp 09/20/2013   Personal history of colonic polyps 08/31/2013   CAD (coronary artery disease) 03/09/2013   DM type 2 with diabetic peripheral neuropathy (HCC) 03/09/2013   Hyperlipidemia associated with type 2 diabetes mellitus (HCC) 03/09/2013   Essential hypertension, benign 03/09/2013   Nonulcer dyspepsia 03/09/2013    Orientation RESPIRATION BLADDER Height & Weight     Self, Place, Time  Normal Incontinent Weight: 119 lb 14.9 oz (54.4 kg) Height:  5' 5.98" (167.6 cm)  BEHAVIORAL SYMPTOMS/MOOD NEUROLOGICAL BOWEL NUTRITION STATUS      Continent Diet (carb modified)  AMBULATORY STATUS COMMUNICATION OF NEEDS Skin   Extensive Assist Verbally Surgical wounds (closed right hip, silver hydrofiber dressing)                       Personal Care Assistance Level of Assistance  Bathing, Feeding, Dressing Bathing Assistance: Maximum assistance Feeding assistance: Limited assistance Dressing Assistance: Maximum assistance     Functional Limitations Info  Sight, Hearing Sight Info: Impaired Hearing Info: Impaired      SPECIAL CARE FACTORS FREQUENCY  PT (By licensed PT), OT (By licensed OT)     PT Frequency: 5x/wk OT Frequency: 5x/wk            Contractures Contractures Info: Not present    Additional Factors Info  Code Status, Allergies, Insulin Sliding Scale, Psychotropic Code Status Info: DNR  Allergies Info: Codeine, Penicillins, Clonidine Derivatives, Cozaar (Losartan), Lipitor (Atorvastatin), Ms Contin (Morphine), Neurontin (Gabapentin), Reglan (Metoclopramide), Valtrex (Valacyclovir), Aldactone (Spironolactone), Apresoline (Hydralazine), Norvasc (Amlodipine) Psychotropic Info: seroquel 12.5 at bedtime Insulin Sliding Scale Info: see DC  summary       Current Medications (10/27/2022):  This is the current hospital active medication list Current Facility-Administered Medications  Medication Dose Route Frequency Provider Last Rate Last Admin   acetaminophen (TYLENOL) tablet 325-650 mg  325-650 mg Oral Q6H PRN Cassandria Anger, PA-C   650 mg at 10/27/22 1013   atenolol (TENORMIN) tablet 25 mg  25 mg Oral BID Cassandria Anger, PA-C   25 mg at 10/27/22 1610   bisacodyl (DULCOLAX) suppository 10 mg  10 mg Rectal Daily PRN Cassandria Anger, PA-C       docusate sodium (COLACE) capsule 100 mg  100 mg Oral BID Cassandria Anger, PA-C   100 mg at 10/27/22 9604   HYDROmorphone (DILAUDID) injection 0.5 mg  0.5 mg Intravenous Q4H PRN Cassandria Anger, PA-C   0.5 mg at 10/26/22 0244   insulin aspart (novoLOG) injection 0-9 Units  0-9 Units Subcutaneous TID WC Cassandria Anger, PA-C   3 Units at 10/27/22 5409   isosorbide mononitrate (IMDUR) 24 hr tablet 60 mg  60 mg Oral BID Cassandria Anger, PA-C   60 mg at 10/27/22 8119   labetalol (NORMODYNE) injection 5 mg  5 mg Intravenous Q2H PRN Hillary Bow, DO   5 mg at 10/27/22 0113   lisinopril (ZESTRIL) tablet 40 mg  40 mg Oral BID Cassandria Anger, PA-C   40 mg at 10/27/22 1478   meclizine (ANTIVERT) tablet 25 mg  25 mg Oral TID PRN Cassandria Anger, PA-C       menthol-cetylpyridinium (CEPACOL) lozenge 3 mg  1 lozenge Oral PRN Cassandria Anger, PA-C       Or   phenol (CHLORASEPTIC) mouth spray 1 spray  1 spray Mouth/Throat PRN Cassandria Anger, PA-C       methocarbamol (ROBAXIN) tablet 500 mg  500 mg Oral Q6H PRN Rosalene Billings R, PA-C   500 mg at 10/26/22 2126   Or   methocarbamol (ROBAXIN) 500 mg in dextrose 5 % 50 mL IVPB  500 mg Intravenous Q6H PRN Cassandria Anger, PA-C       neomycin-polymyxin b-dexamethasone (MAXITROL) ophthalmic suspension 1 drop  1 drop Both Eyes BID Cassandria Anger, PA-C   1 drop at 10/27/22 2956   oxyCODONE (Oxy IR/ROXICODONE) immediate release  tablet 2.5-5 mg  2.5-5 mg Oral Q4H PRN Cassandria Anger, PA-C   5 mg at 10/26/22 2126   polyethylene glycol (MIRALAX / GLYCOLAX) packet 17 g  17 g Oral BID Cassandria Anger, PA-C   17 g at 10/27/22 2130   polyvinyl alcohol (LIQUIFILM TEARS) 1.4 % ophthalmic solution 1 drop  1 drop Both Eyes QID PRN Cassandria Anger, PA-C       QUEtiapine (SEROQUEL) tablet 12.5 mg  12.5 mg Oral QHS Jetty Duhamel T, MD   12.5 mg at 10/26/22 2115   warfarin (COUMADIN) tablet 2 mg  2 mg Oral ONCE-1600 Hammons, Gerhard Munch, RPH       Warfarin - Pharmacist Dosing Inpatient   Does not apply q1600 Hammons, Gerhard Munch, [facility]         Discharge Medications: Please see discharge summary for a list of discharge medications.  Relevant Imaging Results:  Relevant Lab Results:  Additional Information SS#: [id number]  Baldemar Lenis, LCSW

## 2022-10-27 NOTE — Progress Notes (Signed)
ANTICOAGULATION CONSULT NOTE  Pharmacy Consult for Warfarin Indication: atrial fibrillation  Allergies  Allergen Reactions   Codeine Anaphylaxis   Penicillins Anaphylaxis   Clonidine Derivatives Other (See Comments)    Unknown reaction   Cozaar [Losartan] Other (See Comments)    Myalgias    Lipitor [Atorvastatin] Other (See Comments)    Unknown reaction   Ms Contin [Morphine] Other (See Comments)    Unknown reaction   Neurontin [Gabapentin] Nausea Only   Reglan [Metoclopramide] Other (See Comments)    Tremors    Valtrex [Valacyclovir] Other (See Comments)    Tremors    Aldactone [Spironolactone] Itching   Apresoline [Hydralazine] Anxiety   Norvasc [Amlodipine] Itching    Does cause itching. Pt does take PRN if needed.    Patient Measurements: Height: 5' 5.98" (167.6 cm) Weight: 54.4 kg (119 lb 14.9 oz) IBW/kg (Calculated) : 59.26  Vital Signs: Temp: 98.7 F (37.1 C) (05/14 0740) Temp Source: Oral (05/14 0740) BP: 156/63 (05/14 0949) Pulse Rate: 73 (05/14 0949)  Labs: Recent Labs    10/24/22 1300 10/25/22 0415 10/25/22 0415 10/26/22 0414 10/27/22 0724 10/27/22 0820  HGB  --  14.1   < > 11.4* 11.1*  --   HCT  --  42.8  --  34.6* 32.4*  --   PLT  --  229  --  166 190  --   LABPROT 22.7* 24.1*  --   --   --  29.3*  INR 2.0* 2.1*  --   --   --  2.7*  CREATININE  --  0.61  --  0.69 0.65  --    < > = values in this interval not displayed.    Estimated Creatinine Clearance: 36.9 mL/min (by C-G formula based on SCr of 0.65 mg/dL).   Medical History: Past Medical History:  Diagnosis Date   Atrial fibrillation (HCC) 2012   Breast cancer (HCC) 04/2018   IDC of left breast    CHF (congestive heart failure) (HCC)    Colon polyp 2003   Compression fracture of L2 lumbar vertebra (HCC)    Coronary artery disease    COVID-19 virus infection 12/25/2019   Diabetes mellitus without complication (HCC)    Dyspnea    Dysrhythmia    atrial fib   Environmental and  seasonal allergies    GERD (gastroesophageal reflux disease)    Hyperlipidemia    Hypertension    Hypertensive urgency 03/10/2021   Irritable bowel syndrome    Multiple rib fractures 03/15/2018   Personal history of radiation therapy    Polyp of colon    PONV (postoperative nausea and vomiting)    with ether, not recently "woke up in surgery once"   Presence of permanent cardiac pacemaker    Tremor    Tremors of nervous system    Ulcer     Medications:  Medications Prior to Admission  Medication Sig Dispense Refill Last Dose   acetaminophen (TYLENOL) 500 MG tablet Take 1,000 mg by mouth every 6 (six) hours as needed for moderate pain, fever or headache.   10/23/2022   amLODipine (NORVASC) 10 MG tablet Take 1 tablet (10 mg total) by mouth daily. (Patient taking differently: Take 10 mg by mouth daily as needed (sBP > 140 after all medications have been taken).) 30 tablet 0 Past Week   atenolol (TENORMIN) 25 MG tablet Take 25 mg by mouth 2 (two) times daily.   10/23/2022 at 0845   hydrochlorothiazide (HYDRODIURIL) 25 MG tablet Take  25 mg by mouth every other day.   Past Week   isosorbide mononitrate (IMDUR) 60 MG 24 hr tablet Take 60 mg by mouth 2 (two) times daily.   10/23/2022   lisinopril (ZESTRIL) 40 MG tablet Take 1 tablet (40 mg total) by mouth 2 (two) times daily. 180 tablet 0 10/23/2022   meclizine (ANTIVERT) 25 MG tablet Take 1 tablet by mouth three times daily as needed (Patient taking differently: Take 25 mg by mouth 3 (three) times daily as needed for dizziness.) 40 tablet 5 Past Month   neomycin-polymyxin b-dexamethasone (MAXITROL) 3.5-10000-0.1 SUSP Place 1 drop into both eyes 2 (two) times daily. 10 day course.   10/23/2022   nitroGLYCERIN (NITROSTAT) 0.4 MG SL tablet Place 0.4 mg under the tongue every 5 (five) minutes as needed for chest pain.   UNK   Polyethyl Glycol-Propyl Glycol (SYSTANE OP) Place 1 drop into both eyes 4 (four) times daily as needed (dry eyes).   10/23/2022    warfarin (COUMADIN) 2 MG tablet Take 2-4 mg by mouth See admin instructions. 2 mg every Sunday and Wednesday night at 2000. Take 4 mg at 2000 on all other days.   10/22/2022 at 2000   glucose blood (ONE TOUCH ULTRA TEST) test strip USE 1 STRIP TO CHECK GLUCOSE TWICE DAILY  Dx code: E11.9 200 each 3    glucose blood (ONE TOUCH ULTRA TEST) test strip USE ONE STRIP TO CHECK GLUCOSE ONCE  DAILY 100 each 3    glucose blood test strip Use to check blood sugars once daily. 100 each 12    Lancets (ONETOUCH DELICA PLUS LANCET33G) MISC Place 2 Devices inside cheek daily. DX Code E11.9. 100 each 2     Assessment: 89 yo F on warfarin PTA for afib.  PTA dose is warfarin 4mg  daily except 2mg  on Sun and Weds.  Warfarin was held for the last 48 hours around the timing of ortho surgery.  INR today 2.7 despite last warfarin dose 5/11.  Noted limited PO intake over the last 48 hours as well.  No bleeding noted.  Goal of Therapy:  INR 2-3 Monitor platelets by anticoagulation protocol: Yes   Plan:  Warfarin 2mg  PO x 1 tonight Daily INR  [employer], Pharm.D., BCPS Clinical Pharmacist Clinical phone for 10/27/2022 from 7:30-3:00 is [phone].  **Pharmacist phone directory can be found on [email or web address] listed under [facility].  10/27/2022 10:14 AM

## 2022-10-27 NOTE — Plan of Care (Signed)
  Problem: Skin Integrity: Goal: Will show signs of wound healing Outcome: Not Progressing   Problem: Pain Management: Goal: Pain level will decrease with appropriate interventions Outcome: Not Progressing   Problem: Activity: Goal: Ability to avoid complications of mobility impairment will improve Outcome: Not Progressing Goal: Ability to tolerate increased activity will improve Outcome: Not Progressing   Problem: Safety: Goal: Ability to remain free from injury will improve Outcome: Not Progressing

## 2022-10-27 NOTE — Progress Notes (Signed)
Leah Holmes  ZOX:[id number] DOB: [birth year] DOA: 10/23/2022 PCP: Sherlene Shams, MD    Brief Narrative:  89 year old with a history of DM2, CAD, chronic atrial fibrillation on warfarin, HTN, HLD, anxiety/depression, remote treatment for breast cancer, and SSS status post pacemaker placement who presented to the [facility] ER 5/10 with a 1 week history of HA and hallucinations.  She had been evaluated by an Ophthalmologist in the outpatient setting who found no acute issues.  She was transferred to [facility] ER from [facility] to undergo an MRI.  CT head at presentation revealed no acute findings.  CXR was unrevealing.  Consultants:  Neurology  Orthopedic Surgery  Goals of Care:  Code Status: DNR   DVT prophylaxis: SCDs for now   Interim Hx: No acute events recorded overnight other than benign elevated blood pressure.  Afebrile.  Vital signs otherwise stable.  Resting comfortably in bed at the time of my visit but is sedate.  Patient's daughter reports she has been less alert today.  No evidence of distress.  Assessment & Plan:  Mechanical fall in the hospital with resultant comminuted displaced intertrochanteric fracture of the right femur S/p intramedullary nailing 5/12 - postoperative care per Orthopedic Surgery -warfarin to resume today, with INR notably still within therapeutic range  Persisting HA with visual hallucinations Has been evaluated by Neurology - EEG unrevealing - CT head x2 w/o acute findings - carotid Dopplers without evidence of significant stenosis bilaterally - no further inpatient w/u is felt to be indicated, and MRI thought to be low yield in setting of non-focal sx w/ 2 negative CT - Neuro opined this may be the initial onset of Lewy body dementia -I have initiated a trial of low dose seroquel as suggested by Neuro but given her advanced age I began at a low dose nightly only -dose will be held each night if the patient is sedate during the day but if she tolerates it  without significant difficulty we could be increased to twice daily dosing  Chronic atrial fibrillation on warfarin Rate is well-controlled -unclear why patient is not on DOAC instead of warfarin (despite extensive review of outpatient records) -cleared by Orthopedics to resume warfarin today (pharmacy to dose)  Hypokalemia Improved with supplementation  Hypomagnesemia Corrected with supplementation  HTN Adjustments made in blood pressure regimen last night -BP at goal presently -avoid overaggressive correction given advanced age  CAD Diffuse three-vessel CAD by cardiac cath 2012 with critical mid LAD stenosis not amenable to PCI -has declined follow-up testing as she would not wish to pursue intervention -no recent anginal symptoms with exertion  DM2 A1c 6.9 09/07/2022 - CBG reasonably well-controlled presently -monitor trend without change in treatment today  SSS status post pacemaker insertion 2017  HLD Continue usual medical therapy  Aortic stenosis Graded as mild via TTE 2022  Family Communication: Spoke with daughter at bedside Disposition: It is anticipated that a SNF rehab stay will be necessary   Objective: Blood pressure (!) 156/63, pulse 73, temperature 98.7 F (37.1 C), temperature source Oral, resp. rate 16, height 5' 5.98" (1.676 m), weight 54.4 kg, SpO2 97 %.  Intake/Output Summary (Last 24 hours) at 10/27/2022 0957 Last data filed at 10/27/2022 0900 Gross per 24 hour  Intake 541.82 ml  Output 500 ml  Net 41.82 ml    Filed Weights   10/23/22 2329 10/25/22 1443  Weight: 54.4 kg 54.4 kg    Examination: General: No acute respiratory distress -sedate Lungs: Clear to auscultation bilaterally  without wheezes  Cardiovascular: Regular rate without gallop - systolic murmur consistent with AoS Abdomen: Nontender, nondistended, soft, bowel sounds positive, no rebound Extremities: No significant edema bilateral lower extremities  CBC: Recent Labs  Lab  10/23/22 1339 10/25/22 0415 10/26/22 0414 10/27/22 0724  WBC 5.0 5.1 8.3 10.7*  NEUTROABS 3.3  --   --   --   HGB 13.8 14.1 11.4* 11.1*  HCT 41.4 42.8 34.6* 32.4*  MCV 95.0 94.1 95.6 93.6  PLT 223 229 166 190    Basic Metabolic Panel: Recent Labs  Lab 10/25/22 0415 10/26/22 0414 10/27/22 0724  NA 135 135 133*  K 3.0* 3.5 3.5  CL 98 98 100  CO2 28 26 27   GLUCOSE 136* 283* 205*  BUN 6* 14 23  CREATININE 0.61 0.69 0.65  CALCIUM 9.2 8.9 8.8*  MG  --  1.5* 2.1    GFR: Estimated Creatinine Clearance: 36.9 mL/min (by C-G formula based on SCr of 0.65 mg/dL).   Scheduled Meds:  atenolol  25 mg Oral BID   docusate sodium  100 mg Oral BID   insulin aspart  0-9 Units Subcutaneous TID WC   isosorbide mononitrate  60 mg Oral BID   lisinopril  40 mg Oral BID   neomycin-polymyxin b-dexamethasone  1 drop Both Eyes BID   polyethylene glycol  17 g Oral BID   potassium chloride  40 mEq Oral Once   QUEtiapine  12.5 mg Oral QHS   Continuous Infusions:  methocarbamol (ROBAXIN) IV       LOS: 2 days   Lonia Blood, MD [facility] Office  [phone] Pager - Text Page per Loretha Stapler  If 7PM-7AM, please contact night-coverage per Amion 10/27/2022, 9:57 AM

## 2022-10-27 NOTE — Progress Notes (Signed)
Dr. Julian Reil informed that latest blood pressure is at 181/66 with SBP running at 190 since 1600. Labetalol ordered.

## 2022-10-27 NOTE — TOC Initial Note (Signed)
Transition of Care [facility]) - Initial/Assessment Note    Patient Details  Name: Leah Holmes MRN: [id number] Date of Birth: 1927-02-10  Transition of Care [facility]) CM/SW Contact:    Baldemar Lenis, LCSW Phone Number: 10/27/2022, 12:44 PM  Clinical Narrative:           CSW spoke with daughter, Lynden Ang, about recommendation for SNF. Daughter in agreement, patient has been to [facility] in [place] before and would prefer to return, if possible. CSW completed referral and sent to [facility] for review, will follow.        Expected Discharge Plan: Skilled Nursing Facility Barriers to Discharge: Continued Medical Work up, Facility will not accept until restraint criteria met   Patient Goals and CMS Choice Patient states their goals for this hospitalization and ongoing recovery are:: patient unable to participate in goal setting, not fully oriented CMS Medicare.gov Compare Post Acute Care list provided to:: Patient Represenative (must comment) Choice offered to / list presented to : Adult Children Cataract ownership interest in [facility].provided to:: Adult Children    Expected Discharge Plan and Services     Post Acute Care Choice: Skilled Nursing Facility Living arrangements for the past 2 months: Single Family Home                                      Prior Living Arrangements/Services Living arrangements for the past 2 months: Single Family Home Lives with:: Adult Children Patient language and need for interpreter reviewed:: No Do you feel safe going back to the place where you live?: Yes      Need for Family Participation in Patient Care: Yes (Comment) Care giver support system in place?: Yes (comment) Current home services: DME Criminal Activity/Legal Involvement Pertinent to Current Situation/Hospitalization: No - Comment as needed  Activities of Daily Living Home Assistive Devices/Equipment: Dentures (specify type), Hearing aid, Walker (specify type)  (upper and lower dentures, bilateral hearing aids) ADL Screening (condition at time of admission) Patient's cognitive ability adequate to safely complete daily activities?: Yes Is the patient deaf or have difficulty hearing?: Yes Does the patient have difficulty seeing, even when wearing glasses/contacts?: No Does the patient have difficulty concentrating, remembering, or making decisions?: Yes Patient able to express need for assistance with ADLs?: Yes Does the patient have difficulty dressing or bathing?: No Independently performs ADLs?: Yes (appropriate for developmental age) Does the patient have difficulty walking or climbing stairs?: Yes Weakness of Legs: Both Weakness of Arms/Hands: None  Permission Sought/Granted Permission sought to share information with : Facility Medical sales representative, Family Supports Permission granted to share information with : Yes, Verbal Permission Granted  Share Information with NAME: Lynden Ang  Permission granted to share info w AGENCY: SNF  Permission granted to share info w Relationship: Daughter     Emotional Assessment Appearance:: Appears stated age Attitude/Demeanor/Rapport: Unable to Assess Affect (typically observed): Unable to Assess Orientation: : Oriented to Self, Oriented to Place, Oriented to  Time Alcohol / Substance Use: Not Applicable Psych Involvement: No (comment)  Admission diagnosis:  Hallucinations [R44.3] Bad headache [R51.9] Intractable headache [R51.9] Altered mental status, unspecified altered mental status type [R41.82] Hip fracture (HCC) [S72.009A] Patient Active Problem List   Diagnosis Date Noted   Hip fracture (HCC) 10/25/2022   Intractable headache 10/24/2022   Hallucination, visual 10/24/2022   Aortic stenosis 10/24/2022   Acute nonintractable headache 10/23/2022   Hallucination 10/23/2022  Allergic rhinitis 08/11/2022   Hypomagnesemia 06/11/2022   Hospital discharge follow-up 06/11/2022   Generalized  weakness 05/21/2022   Rib pain on right side 04/08/2022   Left foot pain 04/08/2022   Counseling regarding end of life decision making 11/01/2021   Glucosuria 09/01/2021   Vitamin D deficiency 05/27/2021   History of COVID-19 04/24/2021   QT prolongation    Back pain 03/10/2021   Myalgia due to statin 09/15/2020   Acquired thrombophilia (HCC) 12/25/2019   S/P placement of cardiac pacemaker 09/08/2019   Invasive ductal carcinoma of breast, female, left (HCC) 04/26/2018   Bilateral carotid artery stenosis 04/20/2018   Aortic atherosclerosis (HCC) 03/15/2018   Balance problem 03/15/2018   History of fall 03/15/2018   Intention tremor 04/15/2017   Osteoporosis 03/21/2016   Sick sinus syndrome (HCC) 01/28/2016   Other malaise 11/01/2015   Constipation 07/27/2015   Chronic atrial fibrillation (HCC) 08/16/2014   Medicare annual wellness visit, subsequent 05/23/2014   Moderate mitral insufficiency 05/08/2014   Moderate tricuspid insufficiency 05/08/2014   Dry mouth 03/10/2014   Low serum vitamin D 02/16/2014   LVH (left ventricular hypertrophy) 11/23/2013   Anal polyp 09/20/2013   Personal history of colonic polyps 08/31/2013   CAD (coronary artery disease) 03/09/2013   DM type 2 with diabetic peripheral neuropathy (HCC) 03/09/2013   Hyperlipidemia associated with type 2 diabetes mellitus (HCC) 03/09/2013   Essential hypertension, benign 03/09/2013   Nonulcer dyspepsia 03/09/2013   PCP:  Sherlene Shams, MD Pharmacy:   [facility] [address] [place] - [address] [place] [address] Phone: [phone] Fax: [phone]     Social Determinants of Health (SDOH) Social History: SDOH Screenings   Food Insecurity: No Food Insecurity (10/24/2022)  Housing: Low Risk  (10/24/2022)  Transportation Needs: No Transportation Needs (10/24/2022)  Utilities: Not At Risk (10/24/2022)  Depression (PHQ2-9): Medium Risk (10/23/2022)  Financial Resource Strain:  Low Risk  (09/03/2022)  Physical Activity: Unknown (09/03/2022)  Social Connections: Unknown (09/03/2022)  Stress: No Stress Concern Present (09/03/2022)  Tobacco Use: Medium Risk (10/26/2022)   SDOH Interventions:     Readmission Risk Interventions     No data to display

## 2022-10-28 DIAGNOSIS — I495 Sick sinus syndrome: Secondary | ICD-10-CM

## 2022-10-28 DIAGNOSIS — R441 Visual hallucinations: Secondary | ICD-10-CM

## 2022-10-28 DIAGNOSIS — R519 Headache, unspecified: Secondary | ICD-10-CM | POA: Diagnosis not present

## 2022-10-28 DIAGNOSIS — S72001A Fracture of unspecified part of neck of right femur, initial encounter for closed fracture: Secondary | ICD-10-CM

## 2022-10-28 DIAGNOSIS — E785 Hyperlipidemia, unspecified: Secondary | ICD-10-CM

## 2022-10-28 DIAGNOSIS — E1142 Type 2 diabetes mellitus with diabetic polyneuropathy: Secondary | ICD-10-CM | POA: Diagnosis not present

## 2022-10-28 DIAGNOSIS — I350 Nonrheumatic aortic (valve) stenosis: Secondary | ICD-10-CM

## 2022-10-28 DIAGNOSIS — E1169 Type 2 diabetes mellitus with other specified complication: Secondary | ICD-10-CM

## 2022-10-28 DIAGNOSIS — I482 Chronic atrial fibrillation, unspecified: Secondary | ICD-10-CM

## 2022-10-28 DIAGNOSIS — I10 Essential (primary) hypertension: Secondary | ICD-10-CM | POA: Diagnosis not present

## 2022-10-28 LAB — CBC WITH DIFFERENTIAL/PLATELET
Abs Immature Granulocytes: 0.05 10*3/uL (ref 0.00–0.07)
Basophils Absolute: 0 10*3/uL (ref 0.0–0.1)
Basophils Relative: 0 %
Eosinophils Absolute: 0 10*3/uL (ref 0.0–0.5)
Eosinophils Relative: 0 %
HCT: 34 % — ABNORMAL LOW (ref 36.0–46.0)
Hemoglobin: 12 g/dL (ref 12.0–15.0)
Immature Granulocytes: 1 %
Lymphocytes Relative: 8 %
Lymphs Abs: 0.9 10*3/uL (ref 0.7–4.0)
MCH: 33.3 pg (ref 26.0–34.0)
MCHC: 35.3 g/dL (ref 30.0–36.0)
MCV: 94.4 fL (ref 80.0–100.0)
Monocytes Absolute: 1.2 10*3/uL — ABNORMAL HIGH (ref 0.1–1.0)
Monocytes Relative: 11 %
Neutro Abs: 9 10*3/uL — ABNORMAL HIGH (ref 1.7–7.7)
Neutrophils Relative %: 80 %
Platelets: 206 10*3/uL (ref 150–400)
RBC: 3.6 MIL/uL — ABNORMAL LOW (ref 3.87–5.11)
RDW: 13.2 % (ref 11.5–15.5)
WBC: 11.1 10*3/uL — ABNORMAL HIGH (ref 4.0–10.5)
nRBC: 0 % (ref 0.0–0.2)

## 2022-10-28 LAB — GLUCOSE, CAPILLARY
Glucose-Capillary: 202 mg/dL — ABNORMAL HIGH (ref 70–99)
Glucose-Capillary: 232 mg/dL — ABNORMAL HIGH (ref 70–99)
Glucose-Capillary: 227 mg/dL — ABNORMAL HIGH (ref 70–99)
Glucose-Capillary: 217 mg/dL — ABNORMAL HIGH (ref 70–99)
Glucose-Capillary: 302 mg/dL — ABNORMAL HIGH (ref 70–99)

## 2022-10-28 LAB — MAGNESIUM: Magnesium: 1.9 mg/dL (ref 1.7–2.4)

## 2022-10-28 LAB — COMPREHENSIVE METABOLIC PANEL
ALT: 16 U/L (ref 0–44)
AST: 26 U/L (ref 15–41)
Albumin: 2.9 g/dL — ABNORMAL LOW (ref 3.5–5.0)
Alkaline Phosphatase: 56 U/L (ref 38–126)
Anion gap: 7 (ref 5–15)
BUN: 23 mg/dL (ref 8–23)
CO2: 28 mmol/L (ref 22–32)
Calcium: 8.7 mg/dL — ABNORMAL LOW (ref 8.9–10.3)
Chloride: 100 mmol/L (ref 98–111)
Creatinine, Ser: 0.59 mg/dL (ref 0.44–1.00)
GFR, Estimated: >60 mL/min (ref >60)
Glucose, Bld: 207 mg/dL — ABNORMAL HIGH (ref 70–99)
Potassium: 4.5 mmol/L (ref 3.5–5.1)
Sodium: 135 mmol/L (ref 135–145)
Total Bilirubin: 1.1 mg/dL (ref 0.3–1.2)
Total Protein: 5.6 g/dL — ABNORMAL LOW (ref 6.5–8.1)

## 2022-10-28 LAB — PROTIME-INR
INR: 2.3 — ABNORMAL HIGH (ref 0.8–1.2)
Prothrombin Time: 25.4 seconds — ABNORMAL HIGH (ref 11.4–15.2)

## 2022-10-28 LAB — PHOSPHORUS: Phosphorus: 2.1 mg/dL — ABNORMAL LOW (ref 2.5–4.6)

## 2022-10-28 MED ORDER — SODIUM PHOSPHATES 45 MMOLE/15ML IV SOLN
15.0000 mmol | Freq: Once | INTRAVENOUS | Status: AC
  Administered 2022-10-28: 15 mmol via INTRAVENOUS
  Filled 2022-10-28: qty 5

## 2022-10-28 MED ORDER — POTASSIUM PHOSPHATES 15 MMOLE/5ML IV SOLN: 20.0000 mmol | Freq: Once | INTRAVENOUS | Status: DC

## 2022-10-28 MED ORDER — WARFARIN SODIUM 4 MG PO TABS
4.0000 mg | ORAL_TABLET | Freq: Once | ORAL | Status: AC
  Administered 2022-10-28: 4 mg via ORAL
  Filled 2022-10-28: qty 1

## 2022-10-28 MED ORDER — BOOST / RESOURCE BREEZE PO LIQD CUSTOM
1.0000 | Freq: Three times a day (TID) | ORAL | Status: DC
  Administered 2022-10-28 – 2022-10-30 (×4): 1 via ORAL

## 2022-10-28 NOTE — Progress Notes (Signed)
PROGRESS NOTE    Leah Holmes  ZOX:[id number] DOB: Oct 23, 1926 DOA: 10/23/2022 PCP: Sherlene Shams, MD   Brief Narrative:  The patient is a 89 year old with a history of DM2, CAD, chronic atrial fibrillation on warfarin, HTN, HLD, anxiety/depression, remote treatment for breast cancer, and SSS status post pacemaker placement who presented to the [facility] ER 5/10 with a 1 week history of HA and hallucinations. She had been evaluated by an Ophthalmologist in the outpatient setting who found no acute issues. She was transferred to [facility] ER from [facility] to undergo an MRI. CT head at presentation revealed no acute findings. CXR was unrevealing.  Head CT x 2 showed no acute findings carotid Dopplers without significant stenosis bilaterally.  Subsequently she had mechanical fall in the hospital which resulted in a comminuted displaced intertrochanteric fracture of the right femur and she underwent intramedullary nailing on 10/25/2022.  Patient is having trouble with swallowing pills so SLP will be consulted and PT OT recommending SNF.  Assessment and Plan: Mechanical fall in the hospital with resultant comminuted displaced intertrochanteric fracture of the right femur S/p intramedullary nailing 5/12 -Postoperative care per Orthopedic Surgery  -Warfarin resumed yesterday, with INR notably still within therapeutic range however will hold given ? Valve Vegetation -Continue with bowel regimen -Orthopedic surgery recommending advancing diet up with therapy and also recommending partial weightbearing in the right lower extremity with follow-up in 2 weeks recommending continue to keep the dressings in place unless soiled -PT OT recommending SNF and has a bed available   Persisting HA with visual hallucinations -Has been evaluated by Neurology - EEG unrevealing - CT head x2 w/o acute findings  -Carotid Dopplers without evidence of significant stenosis bilaterally  -No further inpatient w/u is felt to be  indicated and MRI was thought to be low yield in setting of non-focal sx w/ 2 negative CT  -Neuro opined this may be the initial onset of Lewy body dementia but after discussion with Neuro again will get MRI formally given ? Valve Vegetation -Initiated a trial of low dose seroquel as suggested by Neuro but given her advanced age was begun at a low dose nightly only  -Dose will be held each night if the patient is sedated during the day but if she tolerates it without significant difficulty we could be increased to twice daily dosing   Chronic atrial fibrillation on warfarin Rate is well-controlled -unclear why patient is not on DOAC instead of warfarin (despite extensive review of outpatient records)  -cleared by Orthopedics to resume warfarin and this was done 10/27/22 (pharmacy to dose) -Continue monitor PT INR and was 25.4/2.3 respectively  Dysphagia -Obtain SLP evaluation and place on Soft Diet    Hypokalemia -Patient's K+ Level Trend: Recent Labs  Lab 10/23/22 1339 10/25/22 0415 10/26/22 0414 10/27/22 0724 10/28/22 0814  K 3.7 3.0* 3.5 3.5 4.5  -Continue to Monitor and Replete as Necessary -Repeat CMP in the AM   Poor Po Intake -Nutritionist Consulted and Initiated Feeding Supplements TID  Hypophosphatemia -Phos Level was 2.1 -Replete with IV Sodium Phos 15 mmol -Continue to Monitor and replete as Necessary -Repeat Phos Level in the AM  Hypomagnesemia -Patient's Mag Level Trend: Recent Labs  Lab 10/26/22 0414 10/27/22 0724 10/28/22 0814  MG 1.5* 2.1 1.9  -Continue to Monitor and Replete as Necessary -Repeat Mag in the AM   HTN Adjustments made in blood pressure regimen last night -BP at goal presently -avoid overaggressive correction given advanced age   CAD -  Diffuse three-vessel CAD by cardiac cath 2012 with critical mid LAD stenosis not amenable to PCI -has declined follow-up testing as she would not wish to pursue intervention -Nno recent anginal symptoms  with exertion   DM2 A1c 6.9 09/07/2022 - CBG reasonably well-controlled presently -monitor trend without change in treatment today -CBG Trend:  Recent Labs  Lab 10/27/22 1210 10/27/22 1605 10/27/22 2106 10/28/22 0612 10/28/22 0811 10/28/22 1133 10/28/22 1655  GLUCAP 206* 166* 185* 202* 232* 227* 217*    SSS status post pacemaker insertion 2017 -Will need MRI Pacer Protocol and Cardiology evaluation given Moderate to Severe Aortic Stenosis   HLD -Continue usual medical therapy   Aortic Stenosis -Graded as mild via TTE 2022 -Repeat ECHO this visit done and showed an EF of 60 to 65% with moderate left concentric hypertrophy and moderate to severe aortic stenosis present -Will formally consult cardiology for further evaluation recommendations  ?  Aortic valve vegetation -TTE done and showed "Findings consistent with hypertrophic cardiomyopathy. Findings concerning for aortic valve vegetation, would recommend a Transesophageal Echocardiogram for clarification." -Consult cardiology formally  Hypoalbuminemia -Patient's Albumin Trend: Recent Labs  Lab 10/23/22 1339 10/28/22 0814  ALBUMIN 4.0 2.9*  -Continue to Monitor and Trend and repeat CMP in the AM   DVT prophylaxis: SCDs Start: 10/25/22 1914 Place TED hose Start: 10/25/22 1914    Code Status: DNR Family Communication: Discussed with the daughter at bedside  Disposition Plan:  Level of care: Med-Surg Status is: Inpatient Remains inpatient appropriate because: His further clinical improvement and will need to go to SNF once medically stable   Consultants:  Neurology Orthopedic surgery Will consult cardiology formally  Procedures:  As delineated as above  ECHOCARDIOGRAM IMPRESSIONS     1. Left ventricular ejection fraction, by estimation, is 60 to 65%. The  left ventricle has normal function. The left ventricle has no regional  wall motion abnormalities. There is moderate concentric left ventricular   hypertrophy. Left ventricular  diastolic parameters are indeterminate.   2. Right ventricular systolic function is normal. The right ventricular  size is normal. There is moderately elevated pulmonary artery systolic  pressure.   3. Left atrial size was moderately dilated.   4. Right atrial size was moderately dilated.   5. The mitral valve is degenerative. Moderate to severe mitral valve  regurgitation. Moderate mitral stenosis. Moderate to severe mitral annular  calcification.   6. Tricuspid valve regurgitation is moderate to severe.   7. Cannot exclude vegetation on AV. The aortic valve is tricuspid. There  is severe calcifcation of the aortic valve. There is severe thickening of  the aortic valve. Aortic valve regurgitation is trivial. Moderate to  severe aortic valve stenosis.   8. There is mild (Grade II) atheroma plaque involving the ascending  aorta.   9. The inferior vena cava is normal in size with <50% respiratory  variability, suggesting right atrial pressure of 8 mmHg.   Conclusion(s)/Recommendation(s): Findings consistent with hypertrophic  cardiomyopathy. Findings concerning for aortic valve vegetation, would  recommend a Transesophageal Echocardiogram for clarification.   FINDINGS   Left Ventricle: Left ventricular ejection fraction, by estimation, is 60  to 65%. The left ventricle has normal function. The left ventricle has no  regional wall motion abnormalities. The left ventricular internal cavity  size was normal in size. There is   moderate concentric left ventricular hypertrophy. Left ventricular  diastolic parameters are indeterminate.   Right Ventricle: The right ventricular size is normal. No increase in  right  ventricular wall thickness. Right ventricular systolic function is  normal. There is moderately elevated pulmonary artery systolic pressure.  The tricuspid regurgitant velocity is  3.29 m/s, and with an assumed right atrial pressure of 15 mmHg,  the  estimated right ventricular systolic pressure is 58.3 mmHg.   Left Atrium: Left atrial size was moderately dilated.   Right Atrium: Right atrial size was moderately dilated.   Pericardium: There is no evidence of pericardial effusion.   Mitral Valve: The mitral valve is degenerative in appearance. There is  moderate thickening of the mitral valve leaflet(s). There is severe  calcification of the posterior mitral valve leaflet(s). Moderately  decreased mobility of the mitral valve leaflets.   Moderate to severe mitral annular calcification. Moderate to severe  mitral valve regurgitation. Moderate mitral valve stenosis. MV peak  gradient, 9.1 mmHg. The mean mitral valve gradient is 2.0 mmHg.   Tricuspid Valve: The tricuspid valve is normal in structure. Tricuspid  valve regurgitation is moderate to severe.   Aortic Valve: Cannot exclude vegetation on AV. The aortic valve is  tricuspid. There is severe calcifcation of the aortic valve. There is  severe thickening of the aortic valve. There is moderate to severe aortic  valve annular calcification. Aortic valve  regurgitation is trivial. Moderate to severe aortic stenosis is present.  Aortic valve mean gradient measures 19.6 mmHg. Aortic valve peak gradient  measures 34.1 mmHg.   Pulmonic Valve: The pulmonic valve was normal in structure. Pulmonic valve  regurgitation is mild.   Aorta: The aortic root is normal in size and structure. There is mild  (Grade II) atheroma plaque involving the ascending aorta.   Venous: The inferior vena cava is normal in size with less than 50%  respiratory variability, suggesting right atrial pressure of 8 mmHg.   IAS/Shunts: The atrial septum is grossly normal.   Additional Comments: A device lead is visualized in the right atrium and  right ventricle.     LEFT VENTRICLE  PLAX 2D  LVIDd:         3.00 cm     Diastology  LVIDs:         2.00 cm     LV e' medial:    5.63 cm/s  LV PW:          1.70 cm     LV E/e' medial:  24.9  LV IVS:        1.80 cm     LV e' lateral:   12.70 cm/s                             LV E/e' lateral: 11.0    LV Volumes (MOD)  LV vol d, MOD A2C: 53.3 ml  LV vol d, MOD A4C: 46.6 ml  LV vol s, MOD A2C: 17.8 ml  LV vol s, MOD A4C: 18.6 ml  LV SV MOD A2C:     35.5 ml  LV SV MOD A4C:     46.6 ml  LV SV MOD BP:      31.3 ml   RIGHT VENTRICLE            IVC  RV S prime:     7.62 cm/s  IVC diam: 2.00 cm  TAPSE (M-mode): 1.1 cm   LEFT ATRIUM             Index        RIGHT ATRIUM  Index  LA diam:        4.20 cm 2.61 cm/m   RA Area:     19.40 cm  LA Vol (A2C):   50.4 ml 31.32 ml/m  RA Volume:   53.20 ml  33.06 ml/m  LA Vol (A4C):   40.2 ml 24.98 ml/m  LA Biplane Vol: 46.5 ml 28.90 ml/m   AORTIC VALVE                    PULMONIC VALVE  AV Vmax:           291.80 cm/s  PV Vmax:          3.31 m/s  AV Vmean:          206.600 cm/s PV Peak grad:     43.8 mmHg  AV VTI:            0.657 m      PR End Diast Vel: 1.44 msec  AV Peak Grad:      34.1 mmHg  AV Mean Grad:      19.6 mmHg  LVOT Vmax:         135.00 cm/s  LVOT Vmean:        91.200 cm/s  LVOT VTI:          0.243 m  LVOT/AV VTI ratio: 0.37    AORTA  Ao Root diam: 3.10 cm  Ao Asc diam:  3.10 cm   MITRAL VALVE                TRICUSPID VALVE  MV Area (PHT): 2.95 cm     TR Peak grad:   43.3 mmHg  MV Peak grad:  9.1 mmHg     TR Vmax:        329.00 cm/s  MV Mean grad:  2.0 mmHg  MV Vmax:       1.50 m/s     SHUNTS  MV Vmean:      66.6 cm/s    Systemic VTI: 0.24 m  MV Decel Time: 257 msec  MV E velocity: 140.00 cm/s   Antimicrobials:  Anti-infectives (From admission, onward)    Start     Dose/Rate Route Frequency Ordered Stop   10/25/22 2200  ceFAZolin (ANCEF) IVPB 2g/100 mL premix        2 g 200 mL/hr over 30 Minutes Intravenous Every 6 hours 10/25/22 1913 10/26/22 0420   10/25/22 1407  ceFAZolin (ANCEF) 2-4 GM/100ML-% IVPB  Status:  Discontinued       Note to Pharmacy: Kathrene Bongo D: cabinet override      10/25/22 1407 10/25/22 1447   10/25/22 1400  ceFAZolin (ANCEF) IVPB 2g/100 mL premix        2 g 200 mL/hr over 30 Minutes Intravenous On call to O.R. 10/25/22 1336 10/25/22 1616       Subjective: Seen and examined at bedside and daughter states that she has had a very poor p.o. appetite.  Patient denies any current pain but nursing was concerned given that she had more bruising.  Remains a little confused.  No other concerns or complaints at this time.  Objective: Vitals:   10/28/22 0717 10/28/22 0752 10/28/22 1138 10/28/22 1555  BP: (!) 199/61 (!) 176/74 (!) 157/88 (!) 148/74  Pulse: 79 77 70 86  Resp: 16  18 18   Temp: 99.5 F (37.5 C)  98.2 F (36.8 C) 98.4 F (36.9 C)  TempSrc: Oral  Oral Oral  SpO2:  96%  96% 97%  Weight:      Height:        Intake/Output Summary (Last 24 hours) at 10/28/2022 1824 Last data filed at 10/28/2022 0900 Gross per 24 hour  Intake 180 ml  Output 500 ml  Net -320 ml   Filed Weights   10/23/22 2329 10/25/22 1443  Weight: 54.4 kg 54.4 kg   Examination: Physical Exam:  Constitutional: Thin chronically ill-appearing elderly Caucasian female Respiratory: Diminished to auscultation bilaterally, no wheezing, rales, rhonchi or crackles. Normal respiratory effort and patient is not tachypenic. No accessory muscle use.  Unlabored breathing Cardiovascular: RRR, has a 3 out of 6 systolic murmur Abdomen: Soft, non-tender, non-distended. Bowel sounds positive.  GU: Deferred. Musculoskeletal: No clubbing / cyanosis of digits/nails. No joint deformity upper and lower extremities.  Skin: No rashes, lesions, ulcers on limited skin evaluation. No induration; Warm and dry.  Neurologic: CN 2-12 grossly intact with no focal deficits. Romberg sign and cerebellar reflexes not assessed.  Psychiatric: Impaired judgment and insight.  She is awake  Data Reviewed: I have personally reviewed following labs and imaging  studies  CBC: Recent Labs  Lab 10/23/22 1339 10/25/22 0415 10/26/22 0414 10/27/22 0724 10/28/22 0814  WBC 5.0 5.1 8.3 10.7* 11.1*  NEUTROABS 3.3  --   --   --  9.0*  HGB 13.8 14.1 11.4* 11.1* 12.0  HCT 41.4 42.8 34.6* 32.4* 34.0*  MCV 95.0 94.1 95.6 93.6 94.4  PLT 223 229 166 190 206   Basic Metabolic Panel: Recent Labs  Lab 10/23/22 1339 10/25/22 0415 10/26/22 0414 10/27/22 0724 10/28/22 0814  NA 136 135 135 133* 135  K 3.7 3.0* 3.5 3.5 4.5  CL 100 98 98 100 100  CO2 27 28 26 27 28   GLUCOSE 161* 136* 283* 205* 207*  BUN 13 6* 14 23 23   CREATININE 0.55 0.61 0.69 0.65 0.59  CALCIUM 9.2 9.2 8.9 8.8* 8.7*  MG  --   --  1.5* 2.1 1.9  PHOS  --   --   --   --  2.1*   GFR: Estimated Creatinine Clearance: 36.9 mL/min (by C-G formula based on SCr of 0.59 mg/dL). Liver Function Tests: Recent Labs  Lab 10/23/22 1339 10/28/22 0814  AST 25 26  ALT 18 16  ALKPHOS 57 56  BILITOT 0.8 1.1  PROT 6.9 5.6*  ALBUMIN 4.0 2.9*   No results for input(s): "LIPASE", "AMYLASE" in the last 168 hours. No results for input(s): "AMMONIA" in the last 168 hours. Coagulation Profile: Recent Labs  Lab 10/23/22 1339 10/24/22 1300 10/25/22 0415 10/27/22 0820 10/28/22 0814  INR 2.0* 2.0* 2.1* 2.7* 2.3*   Cardiac Enzymes: No results for input(s): "CKTOTAL", "CKMB", "CKMBINDEX", "TROPONINI" in the last 168 hours. BNP (last 3 results) No results for input(s): "PROBNP" in the last 8760 hours. HbA1C: No results for input(s): "HGBA1C" in the last 72 hours. CBG: Recent Labs  Lab 10/27/22 2106 10/28/22 0612 10/28/22 0811 10/28/22 1133 10/28/22 1655  GLUCAP 185* 202* 232* 227* 217*   Lipid Profile: No results for input(s): "CHOL", "HDL", "LDLCALC", "TRIG", "CHOLHDL", "LDLDIRECT" in the last 72 hours. Thyroid Function Tests: No results for input(s): "TSH", "T4TOTAL", "FREET4", "T3FREE", "THYROIDAB" in the last 72 hours. Anemia Panel: No results for input(s): "VITAMINB12", "FOLATE",  "FERRITIN", "TIBC", "IRON", "RETICCTPCT" in the last 72 hours. Sepsis Labs: No results for input(s): "PROCALCITON", "LATICACIDVEN" in the last 168 hours.  Recent Results (from the past 240 hour(s))  Surgical pcr screen  Status: None   Collection Time: 10/25/22  1:54 PM   Specimen: Nasal Mucosa; Nasal Swab  Result Value Ref Range Status   MRSA, PCR NEGATIVE NEGATIVE Final   Staphylococcus aureus NEGATIVE NEGATIVE Final    Comment: (NOTE) The Xpert SA Assay (FDA approved for NASAL specimens in patients 2 years of age and older), is one component of a comprehensive surveillance program. It is not intended to diagnose infection nor to guide or monitor treatment. Performed at [facility], [address] [place], [place] [address]     Radiology Studies: No results found.  Scheduled Meds:  atenolol  25 mg Oral BID   docusate sodium  100 mg Oral BID   feeding supplement  1 Container Oral TID BM   insulin aspart  0-9 Units Subcutaneous TID WC   isosorbide mononitrate  60 mg Oral BID   lisinopril  40 mg Oral BID   neomycin-polymyxin b-dexamethasone  1 drop Both Eyes BID   polyethylene glycol  17 g Oral BID   QUEtiapine  12.5 mg Oral QHS   Warfarin - Pharmacist Dosing Inpatient   Does not apply q1600   Continuous Infusions:  methocarbamol (ROBAXIN) IV      LOS: 3 days   Marguerita Merles, DO [facility] Available via Epic secure chat 7am-7pm After these hours, please refer to coverage provider listed on [email or web address] 10/28/2022, 6:24 PM

## 2022-10-28 NOTE — Plan of Care (Signed)
Discussed with Dr. Marland Mcalpine   Plan: I do think it is reasonable to pursue inpatient MRI given patient's persistent confusion and concern for valvular vegetation on echocardiogram.  In the interim, hold warfarin, but would not reverse it.  She is not in the window for permissive hypertension and stroke workup other than lipid panel and MRA head have already been completed or ordered; I will add on these studies to complete workup.  Neurology will follow-up MRI  Data reviewed: Patient had been planned for discharge after neurology evaluation however she had a fall on Sunday 5/12 at 9:20 AM in the bathroom and had a right femur fracture for which she was taken to the OR; warfarin was held for her procedure but she has remained therapeutic INR and did  receive today's dose of warfarin.  Subsequently she remained confused.  Echocardiogram was obtained which showed concern for possible vegetation. Patient has had a low-grade fever to 99.5 despite being on essentially scheduled Tylenol  ECHO  1. Left ventricular ejection fraction, by estimation, is 60 to 65%. The  left ventricle has normal function. The left ventricle has no regional  wall motion abnormalities. There is moderate concentric left ventricular  hypertrophy. Left ventricular  diastolic parameters are indeterminate.   2. Right ventricular systolic function is normal. The right ventricular  size is normal. There is moderately elevated pulmonary artery systolic  pressure.   3. Left atrial size was moderately dilated.   4. Right atrial size was moderately dilated.   5. The mitral valve is degenerative. Moderate to severe mitral valve  regurgitation. Moderate mitral stenosis. Moderate to severe mitral annular  calcification.   6. Tricuspid valve regurgitation is moderate to severe.   7. Cannot exclude vegetation on AV. The aortic valve is tricuspid. There  is severe calcifcation of the aortic valve. There is severe thickening of  the aortic  valve. Aortic valve regurgitation is trivial. Moderate to  severe aortic valve stenosis.   8. There is mild (Grade II) atheroma plaque involving the ascending  aorta.   9. The inferior vena cava is normal in size with <50% respiratory  variability, suggesting right atrial pressure of 8 mmHg.   Cartoid duplex  Right Carotid: Velocities in the right ICA are consistent with a 1-39%  stenosis.  Left Carotid: Velocities in the left ICA are consistent with a 1-39%  stenosis.  Vertebrals: Bilateral vertebral arteries demonstrate antegrade flow.   Lab Results  Component Value Date   HGBA1C 6.9 (H) 09/07/2022    Lab Results  Component Value Date   CHOL 174 09/13/2020   HDL 60.10 09/13/2020   LDLCALC 101 (H) 09/13/2020   LDLDIRECT 116.0 10/30/2015   TRIG 63.0 09/13/2020   CHOLHDL 3 09/13/2020    Basic Metabolic Panel: Recent Labs  Lab 10/23/22 1339 10/25/22 0415 10/26/22 0414 10/27/22 0724 10/28/22 0814  NA 136 135 135 133* 135  K 3.7 3.0* 3.5 3.5 4.5  CL 100 98 98 100 100  CO2 27 28 26 27 28   GLUCOSE 161* 136* 283* 205* 207*  BUN 13 6* 14 23 23   CREATININE 0.55 0.61 0.69 0.65 0.59  CALCIUM 9.2 9.2 8.9 8.8* 8.7*  MG  --   --  1.5* 2.1 1.9  PHOS  --   --   --   --  2.1*    CBC: Recent Labs  Lab 10/23/22 1339 10/25/22 0415 10/26/22 0414 10/27/22 0724 10/28/22 0814  WBC 5.0 5.1 8.3 10.7* 11.1*  NEUTROABS 3.3  --   --   --  9.0*  HGB 13.8 14.1 11.4* 11.1* 12.0  HCT 41.4 42.8 34.6* 32.4* 34.0*  MCV 95.0 94.1 95.6 93.6 94.4  PLT 223 229 166 190 206    Coagulation Studies: Recent Labs    10/27/22 0820 10/28/22 0814  LABPROT 29.3* 25.4*  INR 2.7* 2.3*

## 2022-10-28 NOTE — Plan of Care (Signed)
  Problem: Education: Goal: Knowledge of General Education information will improve Description: Including pain rating scale, medication(s)/side effects and non-pharmacologic comfort measures Outcome: Progressing   Problem: Clinical Measurements: Goal: Ability to maintain clinical measurements within normal limits will improve Outcome: Progressing Goal: Will remain free from infection Outcome: Progressing   

## 2022-10-28 NOTE — Progress Notes (Signed)
New area of bruising/hematoma around R hip surgical site. Changed from yesterday. Nuerovascular assessment unchanged Attending MD Marland Mcalpine and Orthopedic PA notified. Per PA to be expected. Order to notify provider  if there is any new bleeding  seen on dressing

## 2022-10-28 NOTE — Consult Note (Addendum)
[facility] Customer service manager [facility]) Accountable Care Organization (ACO) [facility] Liaison Note  10/28/2022  Mollyanne Schwarzkopf 07-31-1926 [id number]  Location: [facility] Liaison met patient at bedside at [facility].  Insurance: MCR ACO   Jellisa Manpreet Riggenbach is a 89 y.o. female who is a Primary Care Patient of Tullo, Mar Daring, MD ([facility]). The patient was screened for readmission hospitalization with noted high risk score for unplanned readmission risk with 2 IP/1 ED in 6 months.  The patient was assessed for potential [facility]) Care Management service needs for post hospital transition for care coordination. Review of patient's electronic medical record reveals patient was admitted for Intractable headaches. Spoke with the pt and daughter Sharlette Dense) at the bedside and introduced [facility] services and benefits. Daughter confirms pt will be discharged to [facility] for ongoing STR. SNF will continue to manage pt's needs however will refer to [facility] PAC-RN if facility is in network for [facility] services. Patient was given an appointment reminder card and 24 hour Nurse Advice Line magnet.   Plan:  HIPAA verified and [facility] Liaison will continue to follow progress and disposition to asess for post hospital community care coordination/management needs.  Referral request for community care coordination: pending disposition.   [facility] Care Management/Population Health does not replace or interfere with any arrangements made by the Inpatient Transition of Care team.   For questions contact:   Elliot Cousin, RN, BSN [facility] Liaison [place]   [facility]  Population Health Office Hours MTWF 8:00 am to 6 pm off on Thursday [phone] mobile [phone] [Office toll free line][facility] Office Hours are M-F 8:30 - 5 pm 24 hour nurse advise line [phone] Conceirge   [email or web address]

## 2022-10-28 NOTE — Progress Notes (Signed)
Occupational Therapy Treatment Patient Details Name: Leah Holmes MRN: [id number] DOB: 1926/10/21 Today's Date: 10/28/2022   History of present illness Pt is a 89 y.o. female who presented 10/23/22 with left-sided headache behind the eyes associated with visual changes and hallucinations of bright lights and occasional figures. CT head negative for acute or interval finding. Awaiting brain MRI. Course complicated by hospital fall with R femur fracture, s/p IM nail 5/12. PMHx of a-fib on Coumadin, PPM, HTN, left breast cancer s/p radiation and lumpectomy, essential tremor, DM2, CAD, CHF, and IBS. Lewy Body dementia   OT comments  Patient supine in bed and agreeable to OT with encouragement.  Reports increased pain in R hip, leg and abdomen today. Requires max assist for bed mobility, reports dizziness at EOB which improved with gaze stabilization cueing (with hx of vertigo using medication PRN per daughter) and tolerated sitting EOB for placing lotion on BLEs to simulate LB bathing.  Decreased tolerance today, will follow acutely but continued to recommend inpatient setting with <3 hrs/day after dc.    Recommendations for follow up therapy are one component of a multi-disciplinary discharge planning process, led by the attending physician.  Recommendations may be updated based on patient status, additional functional criteria and insurance authorization.    Assistance Recommended at Discharge Frequent or constant Supervision/Assistance  Patient can return home with the following  Two people to help with walking and/or transfers;A lot of help with bathing/dressing/bathroom;Assistance with cooking/housework;Direct supervision/assist for medications management;Direct supervision/assist for financial management;Assist for transportation;Help with stairs or ramp for entrance   Equipment Recommendations  Other (comment) (defer)    Recommendations for Other Services      Precautions /  Restrictions Precautions Precautions: Fall Restrictions Weight Bearing Restrictions: Yes RLE Weight Bearing: Partial weight bearing RLE Partial Weight Bearing Percentage or Pounds: 50       Mobility Bed Mobility Overal bed mobility: Needs Assistance Bed Mobility: Supine to Sit, Sit to Supine     Supine to sit: Max assist, HOB elevated Sit to supine: Mod assist, +2 for physical assistance, +2 for safety/equipment, HOB elevated   General bed mobility comments: max assist with increased time and cueing for technique to EOB with assist for R LE, scooting and trunk.  returned to bed with mod assist +2    Transfers                   General transfer comment: pt declined d/t pain     Balance Overall balance assessment: Needs assistance Sitting-balance support: No upper extremity supported, Feet supported, Bilateral upper extremity supported Sitting balance-[name]: Fair Sitting balance - Comments: min guard dynamically                                   ADL either performed or assessed with clinical judgement   ADL Overall ADL's : Needs assistance/impaired     Grooming: Set up;Sitting       Lower Body Bathing: Maximal assistance;Sitting/lateral leans Lower Body Bathing Details (indicate cue type and reason): simulated applying lotion to LEs     Lower Body Dressing: Total assistance;Sitting/lateral leans     Toilet Transfer Details (indicate cue type and reason): deferred, pt declined         Functional mobility during ADLs: Maximal assistance General ADL Comments: limited to EOB only today    Extremity/Trunk Assessment  Vision       Perception     Praxis      Cognition Arousal/Alertness: Awake/alert Behavior During Therapy: Flat affect Overall Cognitive Status: Impaired/Different from baseline Area of Impairment: Memory, Following commands, Safety/judgement, Awareness, Problem solving, Attention                    Current Attention Level: Sustained Memory: Decreased recall of precautions, Decreased short-term memory Following Commands: Follows one step commands consistently, Follows one step commands with increased time, Follows multi-step commands inconsistently Safety/Judgement: Decreased awareness of safety, Decreased awareness of deficits Awareness: Emergent Problem Solving: Slow processing, Requires verbal cues, Requires tactile cues, Difficulty sequencing, Decreased initiation General Comments: pt more alert and following simple commands with increased.  internally distracted by pain.        Exercises      Shoulder Instructions       General Comments daughter present and supportive    Pertinent Vitals/ Pain       Pain Assessment Pain Assessment: Faces Faces Pain Scale: Hurts even more Pain Location: R hip Pain Descriptors / Indicators: Discomfort, Guarding Pain Intervention(s): Limited activity within patient's tolerance, Monitored during session, Repositioned  Home Living                                          Prior Functioning/Environment              Frequency  Min 2X/week        Progress Toward Goals  OT Goals(current goals can now be found in the care plan section)  Progress towards OT goals: Progressing toward goals  Acute Rehab OT Goals Patient Stated Goal: less pain OT Goal Formulation: With patient Time For Goal Achievement: 11/09/22 Potential to Achieve Goals: Good  Plan Discharge plan remains appropriate;Frequency remains appropriate    Co-evaluation                 AM-PAC OT "6 Clicks" Daily Activity     Outcome Measure   Help from another person eating meals?: A Little Help from another person taking care of personal grooming?: Total Help from another person toileting, which includes using toliet, bedpan, or urinal?: A Lot Help from another person bathing (including washing, rinsing, drying)?: A Lot Help  from another person to put on and taking off regular upper body clothing?: A Little Help from another person to put on and taking off regular lower body clothing?: Total 6 Click Score: 12    End of Session    OT Visit Diagnosis: Other abnormalities of gait and mobility (R26.89);Muscle weakness (generalized) (M62.81);Pain;Other symptoms and signs involving cognitive function;History of falling (Z91.81) Pain - Right/Left: Right Pain - part of body: Leg;Hip   Activity Tolerance Patient limited by pain   Patient Left in bed;with call bell/phone within reach;with bed alarm set;with family/visitor present;with SCD's reapplied   Nurse Communication Mobility status        Time: 1610-9604 OT Time Calculation (min): 27 min  Charges: OT General Charges $OT Visit: 1 Visit OT Treatments $Self Care/Home Management : 23-37 mins  Barry Brunner, OT Acute Rehabilitation Services Office [phone]   Leah Holmes 10/28/2022, 11:57 AM

## 2022-10-28 NOTE — Progress Notes (Signed)
Complaining of new difficulty with swallowing pills. Was swallowing whole in applesauce but recently requiring them to be crushed as she says she can't swallow them whole.  Per daughter also having difficulty with chewing and consuming dinner tray. MD [facility]  notified and patient placed on a soft diet. Also order placed for speech therapy

## 2022-10-28 NOTE — TOC Progression Note (Signed)
Transition of Care [facility]) - Initial/Assessment Note    Patient Details  Name: Leah Holmes MRN: [id number] Date of Birth: 05-02-1927  Transition of Care [facility]) CM/SW Contact:    Ralene Bathe, LCSWA Phone Number: 10/28/2022, 1:27 PM  Clinical Narrative:                 LCSW contacted Ricky in admissions at [facility] to solidify bed offer.  The facility can accept the patient when medically ready.  LCSW contacted the patient's daughter, Lynden Ang, and informed of the above.    TOC following  Expected Discharge Plan: Skilled Nursing Facility Barriers to Discharge: Continued Medical Work up, Facility will not accept until restraint criteria met   Patient Goals and CMS Choice Patient states their goals for this hospitalization and ongoing recovery are:: patient unable to participate in goal setting, not fully oriented CMS Medicare.gov Compare Post Acute Care list provided to:: Patient Represenative (must comment) Choice offered to / list presented to : Adult Children [place] ownership interest in [facility].provided to:: Adult Children    Expected Discharge Plan and Services     Post Acute Care Choice: Skilled Nursing Facility Living arrangements for the past 2 months: Single Family Home                                      Prior Living Arrangements/Services Living arrangements for the past 2 months: Single Family Home Lives with:: Adult Children Patient language and need for interpreter reviewed:: No Do you feel safe going back to the place where you live?: Yes      Need for Family Participation in Patient Care: Yes (Comment) Care giver support system in place?: Yes (comment) Current home services: DME Criminal Activity/Legal Involvement Pertinent to Current Situation/Hospitalization: No - Comment as needed  Activities of Daily Living Home Assistive Devices/Equipment: Dentures (specify type), Hearing aid, Walker (specify type) (upper  and lower dentures, bilateral hearing aids) ADL Screening (condition at time of admission) Patient's cognitive ability adequate to safely complete daily activities?: Yes Is the patient deaf or have difficulty hearing?: Yes Does the patient have difficulty seeing, even when wearing glasses/contacts?: No Does the patient have difficulty concentrating, remembering, or making decisions?: Yes Patient able to express need for assistance with ADLs?: Yes Does the patient have difficulty dressing or bathing?: No Independently performs ADLs?: Yes (appropriate for developmental age) Does the patient have difficulty walking or climbing stairs?: Yes Weakness of Legs: Both Weakness of Arms/Hands: None  Permission Sought/Granted Permission sought to share information with : Facility Medical sales representative, Family Supports Permission granted to share information with : Yes, Verbal Permission Granted  Share Information with NAME: Lynden Ang  Permission granted to share info w AGENCY: SNF  Permission granted to share info w Relationship: Daughter     Emotional Assessment Appearance:: Appears stated age Attitude/Demeanor/Rapport: Unable to Assess Affect (typically observed): Unable to Assess Orientation: : Oriented to Self, Oriented to Place, Oriented to  Time Alcohol / Substance Use: Not Applicable Psych Involvement: No (comment)  Admission diagnosis:  Hallucinations [R44.3] Bad headache [R51.9] Intractable headache [R51.9] Altered mental status, unspecified altered mental status type [R41.82] Hip fracture (HCC) [S72.009A] Patient Active Problem List   Diagnosis Date Noted   Hip fracture (HCC) 10/25/2022   Intractable headache 10/24/2022   Hallucination, visual 10/24/2022   Aortic stenosis 10/24/2022   Acute nonintractable headache 10/23/2022   Hallucination  10/23/2022   Allergic rhinitis 08/11/2022   Hypomagnesemia 06/11/2022   Hospital discharge follow-up 06/11/2022   Generalized weakness  05/21/2022   Rib pain on right side 04/08/2022   Left foot pain 04/08/2022   Counseling regarding end of life decision making 11/01/2021   Glucosuria 09/01/2021   Vitamin D deficiency 05/27/2021   History of COVID-19 04/24/2021   QT prolongation    Back pain 03/10/2021   Myalgia due to statin 09/15/2020   Acquired thrombophilia (HCC) 12/25/2019   S/P placement of cardiac pacemaker 09/08/2019   Invasive ductal carcinoma of breast, female, left (HCC) 04/26/2018   Bilateral carotid artery stenosis 04/20/2018   Aortic atherosclerosis (HCC) 03/15/2018   Balance problem 03/15/2018   History of fall 03/15/2018   Intention tremor 04/15/2017   Osteoporosis 03/21/2016   Sick sinus syndrome (HCC) 01/28/2016   Other malaise 11/01/2015   Constipation 07/27/2015   Chronic atrial fibrillation (HCC) 08/16/2014   Medicare annual wellness visit, subsequent 05/23/2014   Moderate mitral insufficiency 05/08/2014   Moderate tricuspid insufficiency 05/08/2014   Dry mouth 03/10/2014   Low serum vitamin D 02/16/2014   LVH (left ventricular hypertrophy) 11/23/2013   Anal polyp 09/20/2013   Personal history of colonic polyps 08/31/2013   CAD (coronary artery disease) 03/09/2013   DM type 2 with diabetic peripheral neuropathy (HCC) 03/09/2013   Hyperlipidemia associated with type 2 diabetes mellitus (HCC) 03/09/2013   Essential hypertension, benign 03/09/2013   Nonulcer dyspepsia 03/09/2013   PCP:  Sherlene Shams, MD Pharmacy:   [facility] [address] [place] - [address] [place] [address] Phone: [phone] Fax: [phone]     Social Determinants of Health (SDOH) Social History: SDOH Screenings   Food Insecurity: No Food Insecurity (10/24/2022)  Housing: Low Risk  (10/24/2022)  Transportation Needs: No Transportation Needs (10/24/2022)  Utilities: Not At Risk (10/24/2022)  Depression (PHQ2-9): Medium Risk (10/23/2022)  Financial Resource Strain: Low Risk   (09/03/2022)  Physical Activity: Unknown (09/03/2022)  Social Connections: Unknown (09/03/2022)  Stress: No Stress Concern Present (09/03/2022)  Tobacco Use: Medium Risk (10/26/2022)   SDOH Interventions:     Readmission Risk Interventions     No data to display

## 2022-10-28 NOTE — Progress Notes (Signed)
ANTICOAGULATION CONSULT NOTE  Pharmacy Consult for Warfarin Indication: atrial fibrillation  Allergies  Allergen Reactions   Codeine Anaphylaxis   Penicillins Anaphylaxis   Clonidine Derivatives Other (See Comments)    Unknown reaction   Cozaar [Losartan] Other (See Comments)    Myalgias    Lipitor [Atorvastatin] Other (See Comments)    Unknown reaction   Ms Contin [Morphine] Other (See Comments)    Unknown reaction   Neurontin [Gabapentin] Nausea Only   Reglan [Metoclopramide] Other (See Comments)    Tremors    Valtrex [Valacyclovir] Other (See Comments)    Tremors    Aldactone [Spironolactone] Itching   Apresoline [Hydralazine] Anxiety   Norvasc [Amlodipine] Itching    Does cause itching. Pt does take PRN if needed.    Patient Measurements: Height: 5' 5.98" (167.6 cm) Weight: 54.4 kg (119 lb 14.9 oz) IBW/kg (Calculated) : 59.26  Vital Signs: Temp: 98.2 F (36.8 C) (05/15 1138) Temp Source: Oral (05/15 1138) BP: 157/88 (05/15 1138) Pulse Rate: 70 (05/15 1138)  Labs: Recent Labs    10/26/22 0414 10/27/22 0724 10/27/22 0820 10/28/22 0814  HGB 11.4* 11.1*  --  12.0  HCT 34.6* 32.4*  --  34.0*  PLT 166 190  --  206  LABPROT  --   --  29.3* 25.4*  INR  --   --  2.7* 2.3*  CREATININE 0.69 0.65  --  0.59     Estimated Creatinine Clearance: 36.9 mL/min (by C-G formula based on SCr of 0.59 mg/dL).   Medical History: Past Medical History:  Diagnosis Date   Atrial fibrillation (HCC) 2012   Breast cancer (HCC) 04/2018   IDC of left breast    CHF (congestive heart failure) (HCC)    Colon polyp 2003   Compression fracture of L2 lumbar vertebra (HCC)    Coronary artery disease    COVID-19 virus infection 12/25/2019   Diabetes mellitus without complication (HCC)    Dyspnea    Dysrhythmia    atrial fib   Environmental and seasonal allergies    GERD (gastroesophageal reflux disease)    Hyperlipidemia    Hypertension    Hypertensive urgency 03/10/2021    Irritable bowel syndrome    Multiple rib fractures 03/15/2018   Personal history of radiation therapy    Polyp of colon    PONV (postoperative nausea and vomiting)    with ether, not recently "woke up in surgery once"   Presence of permanent cardiac pacemaker    Tremor    Tremors of nervous system    Ulcer     Medications:  Medications Prior to Admission  Medication Sig Dispense Refill Last Dose   acetaminophen (TYLENOL) 500 MG tablet Take 1,000 mg by mouth every 6 (six) hours as needed for moderate pain, fever or headache.   10/23/2022   amLODipine (NORVASC) 10 MG tablet Take 1 tablet (10 mg total) by mouth daily. (Patient taking differently: Take 10 mg by mouth daily as needed (sBP > 140 after all medications have been taken).) 30 tablet 0 Past Week   atenolol (TENORMIN) 25 MG tablet Take 25 mg by mouth 2 (two) times daily.   10/23/2022 at 0845   hydrochlorothiazide (HYDRODIURIL) 25 MG tablet Take 25 mg by mouth every other day.   Past Week   isosorbide mononitrate (IMDUR) 60 MG 24 hr tablet Take 60 mg by mouth 2 (two) times daily.   10/23/2022   lisinopril (ZESTRIL) 40 MG tablet Take 1 tablet (40 mg total) by  mouth 2 (two) times daily. 180 tablet 0 10/23/2022   meclizine (ANTIVERT) 25 MG tablet Take 1 tablet by mouth three times daily as needed (Patient taking differently: Take 25 mg by mouth 3 (three) times daily as needed for dizziness.) 40 tablet 5 Past Month   neomycin-polymyxin b-dexamethasone (MAXITROL) 3.5-10000-0.1 SUSP Place 1 drop into both eyes 2 (two) times daily. 10 day course.   10/23/2022   nitroGLYCERIN (NITROSTAT) 0.4 MG SL tablet Place 0.4 mg under the tongue every 5 (five) minutes as needed for chest pain.   UNK   Polyethyl Glycol-Propyl Glycol (SYSTANE OP) Place 1 drop into both eyes 4 (four) times daily as needed (dry eyes).   10/23/2022   warfarin (COUMADIN) 2 MG tablet Take 2-4 mg by mouth See admin instructions. 2 mg every Sunday and Wednesday night at 2000. Take 4 mg at  2000 on all other days.   10/22/2022 at 2000   glucose blood (ONE TOUCH ULTRA TEST) test strip USE 1 STRIP TO CHECK GLUCOSE TWICE DAILY  Dx code: E11.9 200 each 3    glucose blood (ONE TOUCH ULTRA TEST) test strip USE ONE STRIP TO CHECK GLUCOSE ONCE  DAILY 100 each 3    glucose blood test strip Use to check blood sugars once daily. 100 each 12    Lancets (ONETOUCH DELICA PLUS LANCET33G) MISC Place 2 Devices inside cheek daily. DX Code E11.9. 100 each 2     Assessment: 89 yo F on warfarin PTA for afib.  PTA dose is warfarin 4mg  daily except 2mg  on Sun and Weds.  Warfarin was held for 48 hours around the timing of ortho surgery.  INR decreased to 2.3 today.  Noted limited PO intake over the last 48 hours as well.  No bleeding noted.  Goal of Therapy:  INR 2-3 Monitor platelets by anticoagulation protocol: Yes   Plan:  Warfarin 4mg  PO x 1 tonight Daily INR  [employer], Pharm.D., BCPS Clinical Pharmacist Clinical phone for 10/28/2022 from 7:30-3:00 is [phone].  **Pharmacist phone directory can be found on [email or web address] listed under [facility].  10/28/2022 12:01 PM

## 2022-10-28 NOTE — Care Management Important Message (Signed)
Important Message  Patient Details  Name: Leah Holmes MRN: [id number] Date of Birth: 04-Sep-1926   Medicare Important Message Given:  Yes     [name] Stefan Church 10/28/2022, 3:38 PM

## 2022-10-29 ENCOUNTER — Inpatient Hospital Stay (HOSPITAL_COMMUNITY): Payer: Medicare Other

## 2022-10-29 DIAGNOSIS — Z950 Presence of cardiac pacemaker: Secondary | ICD-10-CM

## 2022-10-29 DIAGNOSIS — R519 Headache, unspecified: Secondary | ICD-10-CM | POA: Diagnosis not present

## 2022-10-29 DIAGNOSIS — R5381 Other malaise: Secondary | ICD-10-CM | POA: Diagnosis not present

## 2022-10-29 DIAGNOSIS — I350 Nonrheumatic aortic (valve) stenosis: Secondary | ICD-10-CM | POA: Diagnosis not present

## 2022-10-29 DIAGNOSIS — I10 Essential (primary) hypertension: Secondary | ICD-10-CM | POA: Diagnosis not present

## 2022-10-29 DIAGNOSIS — R441 Visual hallucinations: Secondary | ICD-10-CM | POA: Diagnosis not present

## 2022-10-29 DIAGNOSIS — I482 Chronic atrial fibrillation, unspecified: Secondary | ICD-10-CM | POA: Diagnosis not present

## 2022-10-29 LAB — CBC WITH DIFFERENTIAL/PLATELET
Abs Immature Granulocytes: 0.03 10*3/uL (ref 0.00–0.07)
Basophils Absolute: 0 10*3/uL (ref 0.0–0.1)
Basophils Relative: 0 %
Eosinophils Absolute: 0 10*3/uL (ref 0.0–0.5)
Eosinophils Relative: 0 %
HCT: 35 % — ABNORMAL LOW (ref 36.0–46.0)
Hemoglobin: 11.4 g/dL — ABNORMAL LOW (ref 12.0–15.0)
Immature Granulocytes: 0 %
Lymphocytes Relative: 9 %
Lymphs Abs: 1 10*3/uL (ref 0.7–4.0)
MCH: 31.2 pg (ref 26.0–34.0)
MCHC: 32.6 g/dL (ref 30.0–36.0)
MCV: 95.9 fL (ref 80.0–100.0)
Monocytes Absolute: 1.2 10*3/uL — ABNORMAL HIGH (ref 0.1–1.0)
Monocytes Relative: 11 %
Neutro Abs: 8.3 10*3/uL — ABNORMAL HIGH (ref 1.7–7.7)
Neutrophils Relative %: 80 %
Platelets: 224 10*3/uL (ref 150–400)
RBC: 3.65 MIL/uL — ABNORMAL LOW (ref 3.87–5.11)
RDW: 13.3 % (ref 11.5–15.5)
WBC: 10.5 10*3/uL (ref 4.0–10.5)
nRBC: 0 % (ref 0.0–0.2)

## 2022-10-29 LAB — GLUCOSE, CAPILLARY
Glucose-Capillary: 230 mg/dL — ABNORMAL HIGH (ref 70–99)
Glucose-Capillary: 175 mg/dL — ABNORMAL HIGH (ref 70–99)
Glucose-Capillary: 287 mg/dL — ABNORMAL HIGH (ref 70–99)
Glucose-Capillary: 242 mg/dL — ABNORMAL HIGH (ref 70–99)
Glucose-Capillary: 274 mg/dL — ABNORMAL HIGH (ref 70–99)

## 2022-10-29 LAB — COMPREHENSIVE METABOLIC PANEL
ALT: 17 U/L (ref 0–44)
AST: 23 U/L (ref 15–41)
Albumin: 2.9 g/dL — ABNORMAL LOW (ref 3.5–5.0)
Alkaline Phosphatase: 52 U/L (ref 38–126)
Anion gap: 10 (ref 5–15)
BUN: 17 mg/dL (ref 8–23)
CO2: 25 mmol/L (ref 22–32)
Calcium: 8.5 mg/dL — ABNORMAL LOW (ref 8.9–10.3)
Chloride: 97 mmol/L — ABNORMAL LOW (ref 98–111)
Creatinine, Ser: 0.51 mg/dL (ref 0.44–1.00)
GFR, Estimated: >60 mL/min (ref >60)
Glucose, Bld: 244 mg/dL — ABNORMAL HIGH (ref 70–99)
Potassium: 4 mmol/L (ref 3.5–5.1)
Sodium: 132 mmol/L — ABNORMAL LOW (ref 135–145)
Total Bilirubin: 1 mg/dL (ref 0.3–1.2)
Total Protein: 5.6 g/dL — ABNORMAL LOW (ref 6.5–8.1)

## 2022-10-29 LAB — MAGNESIUM: Magnesium: 1.8 mg/dL (ref 1.7–2.4)

## 2022-10-29 LAB — LIPID PANEL
Cholesterol: 133 mg/dL (ref 0–200)
HDL: 52 mg/dL (ref >40)
LDL Cholesterol: 69 mg/dL (ref 0–99)
Total CHOL/HDL Ratio: 2.6 RATIO
Triglycerides: 59 mg/dL (ref <150)
VLDL: 12 mg/dL (ref 0–40)

## 2022-10-29 LAB — PROTIME-INR
INR: 2.5 — ABNORMAL HIGH (ref 0.8–1.2)
Prothrombin Time: 27 seconds — ABNORMAL HIGH (ref 11.4–15.2)

## 2022-10-29 LAB — PHOSPHORUS: Phosphorus: 2.6 mg/dL (ref 2.5–4.6)

## 2022-10-29 MED ORDER — DOCUSATE SODIUM 50 MG/5ML PO LIQD
100.0000 mg | Freq: Two times a day (BID) | ORAL | Status: DC
  Administered 2022-10-29 – 2022-10-31 (×5): 100 mg via ORAL
  Filled 2022-10-29 (×5): qty 10

## 2022-10-29 NOTE — Progress Notes (Signed)
Per order, Changed device settings for MRI to VOO at 100 bpm/MRI mode Will program device back to pre-MRI settings after completion of exam, and send transmission.

## 2022-10-29 NOTE — Progress Notes (Signed)
Initial Nutrition Assessment  DOCUMENTATION CODES:  Severe malnutrition in context of chronic illness  INTERVENTION:  Continue DYS 3 diet, add gravy to trays with meat Change Boost Breeze to Ensure Enlive po TID, each supplement provides 350 kcal and 20 grams of protein.  MVI with minerals daily  NUTRITION DIAGNOSIS:   Severe Malnutrition (in the context of chronic illness) related to  (inadequate energy intake) as evidenced by severe fat depletion, severe muscle depletion.  GOAL:   Patient will meet greater than or equal to 90% of their needs  MONITOR:   PO intake, Supplement acceptance, Labs, Weight trends  REASON FOR ASSESSMENT:   Consult Assessment of nutrition requirement/status, Diet education  ASSESSMENT:   Pt with hx of DM type 2, HLD, HTN, atrial fibrillation, GERD, IBS, CHF, CAD, and hx breast cancer presented to ED with visual changes / hallucinations.  5/12 - Op, IM of the right hip  While admitted, pt had a fall while ambulating to the bathroom and sustained a right hip fx. Underwent repair 5/12  Pt resting in bed napping at the time of assessment. Daughter present at bedside able to provide a hx. States that for several years pt has been eating less and has lost ~15-20 lbs. States that pt continues to eat 3x/d but that portions are very small. Does like to drink milk 2x/d at home and snacks on small items between meals (pudding, little debbie cakes, etc). Also endorses decreased energy over the last few weeks.   On exam, pt does show signs of significant muscle and fat depletions. Agreeable to adjusting supplements to ensure enlive and will also ask dining services to send gravy with dinner when meats are present.  Average Meal Intake: 5/13-5/17: 24% intake x 9 recorded meals  Nutritionally Relevant Medications: Scheduled Meds:  docusate  100 mg Oral BID   Ensure Enlive  237 mL Oral TID BM   insulin aspart  0-9 Units Subcutaneous TID WC   multivitamin with  minerals  1 tablet Oral Daily   phosphorus  500 mg Oral BID   polyethylene glycol  17 g Oral BID   Continuous Infusions:  magnesium sulfate bolus IVPB 2 g (10/30/22 1146)   PRN Meds: meclizine, phenol  Labs Reviewed: Na 134, chloride 97 Phosphorus 2.2 CBG ranges from 175-323 mg/dL over the last 24 hours  NUTRITION - FOCUSED PHYSICAL EXAM: Flowsheet Row Most Recent Value  Orbital Region Severe depletion  Upper Arm Region Severe depletion  Thoracic and Lumbar Region Moderate depletion  Buccal Region Severe depletion  Temple Region Severe depletion  Clavicle Bone Region Severe depletion  Clavicle and Acromion Bone Region Severe depletion  Scapular Bone Region Mild depletion  Dorsal Hand Severe depletion  Patellar Region Severe depletion  Anterior Thigh Region Severe depletion  Posterior Calf Region Moderate depletion  Edema (RD Assessment) None  Hair Reviewed  Eyes Reviewed  Mouth Reviewed  Skin Reviewed  Nails Reviewed    Diet Order:   Diet Order             DIET DYS 3 Room service appropriate? No; Fluid consistency: Thin  Diet effective now                   EDUCATION NEEDS:  Not appropriate for education at this time  Skin:  Skin Assessment: Reviewed RN Assessment Surgical incisions, right hip  Last BM:  5/17 - type 5  Height:  Ht Readings from Last 1 Encounters:  10/25/22 5' 5.98" (1.676  m)    Weight:  Wt Readings from Last 1 Encounters:  10/25/22 54.4 kg    Ideal Body Weight:  59.1 kg  BMI:  Body mass index is 19.37 kg/m.  Estimated Nutritional Needs:  Kcal:  1400-1600 kcal/d Protein:  65-80 g/d Fluid:  >/=1.5L/d    Greig Castilla, RD, LDN Clinical Dietitian RD pager # available in [facility]  After hours/weekend pager # available in [facility]

## 2022-10-29 NOTE — Evaluation (Signed)
Clinical/Bedside Swallow Evaluation Patient Details  Name: Leah Holmes MRN: [id number] Date of Birth: 1926-12-28  Today's Date: 10/29/2022 Time: SLP Start Time (ACUTE ONLY): 8295 SLP Stop Time (ACUTE ONLY): 0836 SLP Time Calculation (min) (ACUTE ONLY): 12 min  Past Medical History:  Past Medical History:  Diagnosis Date   Atrial fibrillation (HCC) 2012   Breast cancer (HCC) 04/2018   IDC of left breast    CHF (congestive heart failure) (HCC)    Colon polyp 2003   Compression fracture of L2 lumbar vertebra (HCC)    Coronary artery disease    COVID-19 virus infection 12/25/2019   Diabetes mellitus without complication (HCC)    Dyspnea    Dysrhythmia    atrial fib   Environmental and seasonal allergies    GERD (gastroesophageal reflux disease)    Hyperlipidemia    Hypertension    Hypertensive urgency 03/10/2021   Irritable bowel syndrome    Multiple rib fractures 03/15/2018   Personal history of radiation therapy    Polyp of colon    PONV (postoperative nausea and vomiting)    with ether, not recently "woke up in surgery once"   Presence of permanent cardiac pacemaker    Tremor    Tremors of nervous system    Ulcer    Past Surgical History:  Past Surgical History:  Procedure Laterality Date   ABDOMINAL ADHESION SURGERY     ABDOMINAL HYSTERECTOMY     APPENDECTOMY     BREAST BIOPSY Right late 80s/early 90s   benign   BREAST BIOPSY Left 03/31/2018   [place] bx done with Dr. Lemar Livings, invasive ductal carcinoma   BREAST LUMPECTOMY Left 04/22/2018   Procedure: BREAST LUMPECTOMY;  Surgeon: Earline Mayotte, MD;  Location: [facility] ORS;  Service: General;  Laterality: Left;   BREAST LUMPECTOMY Left 04/22/2018   IMC, negative LN, clear margins   BREAST LUMPECTOMY Left 2020   positive, done in office   BREAST SURGERY Right    biopsy    CESAREAN SECTION     x 2   CHOLECYSTECTOMY  03/2012   Dr Excell Seltzer   COLONOSCOPY  2003   Dr Fritzi Mandes in [place]   fibroid tumor      INTRAMEDULLARY (IM) NAIL INTERTROCHANTERIC Right 10/25/2022   Procedure: INTRAMEDULLARY (IM) NAIL INTERTROCHANTERIC;  Surgeon: Durene Romans, MD;  Location: [facility] OR;  Service: Orthopedics;  Laterality: Right;   PACEMAKER INSERTION N/A 01/28/2016   Procedure: INSERTION PACEMAKER;  Surgeon: Marcina Millard, MD;  Location: [facility] ORS;  Service: Cardiovascular;  Laterality: N/A;   SENTINEL NODE BIOPSY Left 04/22/2018   Procedure: SENTINEL NODE BIOPSY;  Surgeon: Earline Mayotte, MD;  Location: [facility] ORS;  Service: General;  Laterality: Left;   UPPER GI ENDOSCOPY  2003   Dr Fritzi Mandes in [place]   HPI:  Leah Holmes is a 89 y.o. female presents to the emergency department as a transfer from [facility] emergency department to obtain an MRI to rule out CVA versus press. Patient presented there after being referred by her PCP for having 3 days of headache and hallucinations. Patient was noted to be hypertensive in the emergency department. CT no acute or interval finding. PMH: GERD, HTN, DM, "personal history of radiation therapy", CAD, breast cancer, A-fib. BSE completed 5/11 and regular/thin recommended without follow up. ST reconsulted as pt had difficulty with pills. Since SLP saw pt last she had a mechanical fall in the hospital with hip fracture with surgery 5/12.    Assessment / Plan /  Recommendation  Clinical Impression  Pt familiar to this SLP and her swallow function appears similar to BSE 5/11. ST reconsulted as pt stated she had difficulty swallowing pills. Oromotor abilities unremarkable and she has upper and lower dentures. Thin liquid trials with straws were consumed without s/s aspiration.She masticated solid timely and states she doesn't eat much meat but would prefer for meats to be chopped as her dentures may not fit as snug now. Recommend Dys 3 texture, thin liquids, and crush pills in puree. No further ST needed and will sign off. SLP Visit Diagnosis: Dysphagia, unspecified  (R13.10)    Aspiration Risk  Mild aspiration risk    Diet Recommendation Dysphagia 3 (Mech soft);Thin liquid   Liquid Administration via: Straw;Cup Medication Administration: Crushed with puree Supervision: Patient able to self feed Compensations: Slow rate;Small sips/bites Postural Changes: Seated upright at 90 degrees    Other  Recommendations Oral Care Recommendations: Oral care BID    Recommendations for follow up therapy are one component of a multi-disciplinary discharge planning process, led by the attending physician.  Recommendations may be updated based on patient status, additional functional criteria and insurance authorization.  Follow up Recommendations No SLP follow up      Assistance Recommended at Discharge    Functional Status Assessment Patient has not had a recent decline in their functional status  Frequency and Duration            Prognosis        Swallow Study   General Date of Onset: 10/24/22 HPI: Leah Holmes is a 89 y.o. female presents to the emergency department as a transfer from [facility] emergency department to obtain an MRI to rule out CVA versus press. Patient presented there after being referred by her PCP for having 3 days of headache and hallucinations. Patient was noted to be hypertensive in the emergency department. CT no acute or interval finding. PMH: GERD, HTN, DM, "personal history of radiation therapy", CAD, breast cancer, A-fib. BSE completed 5/11 and regular/thin recommended without follow up. ST reconsulted as pt had difficulty with pills. Since SLP sae pt last she had a mechanical fall in the hospital with hip fracture with surgery 5/12. Type of Study: Bedside Swallow Evaluation Previous Swallow Assessment:  (see HPI) Diet Prior to this Study: Regular;Thin liquids (Level 0) Temperature Spikes Noted: No Respiratory Status: Room air History of Recent Intubation: No Behavior/Cognition: Alert;Cooperative;Pleasant mood Oral  Cavity Assessment: Within Functional Limits Oral Care Completed by SLP: No Oral Cavity - Dentition: Dentures, top;Dentures, bottom Vision: Functional for self-feeding Self-Feeding Abilities: Able to feed self Patient Positioning: Upright in bed Baseline Vocal Quality: Normal    Oral/Motor/Sensory Function Overall Oral Motor/Sensory Function: Within functional limits   Ice Chips Ice chips: Not tested   Thin Liquid Thin Liquid: Within functional limits Presentation: Straw    Nectar Thick Nectar Thick Liquid: Not tested   Honey Thick Honey Thick Liquid: Not tested   Puree Puree: Not tested   Solid     Solid: Within functional limits      Royce Macadamia 10/29/2022,8:51 AM

## 2022-10-29 NOTE — Progress Notes (Signed)
Physical Therapy Treatment Patient Details Name: Leah Holmes MRN: [id number] DOB: 01-05-1927 Today's Date: 10/29/2022   History of Present Illness Pt is a 89 y.o. female who presented 10/23/22 with left-sided headache behind the eyes associated with visual changes and hallucinations of bright lights and occasional figures. CT head negative for acute or interval finding. Awaiting brain MRI. Course complicated by hospital fall with R femur fracture, s/p IM nail 5/12. PMHx of a-fib on Coumadin, PPM, HTN, left breast cancer s/p radiation and lumpectomy, essential tremor, DM2, CAD, CHF, and IBS. Lewy Body dementia    PT Comments    Pt received in bed, daughter helping her eat breakfast. Pt reports increased pain in R hip to knee and throat. Pt not agreeable to getting in chair but did work with therapy in standing EOB. Had increased difficulty wt shifting to R to be able to step L foot. Pt reported dizziness with mobility, RN notified. Pt tolerated upright sitting EOB and AAROM exercises for RLE. Patient will benefit from continued inpatient follow up therapy, <3 hours/day. PT will continue to follow.    Recommendations for follow up therapy are one component of a multi-disciplinary discharge planning process, led by the attending physician.  Recommendations may be updated based on patient status, additional functional criteria and insurance authorization.  Follow Up Recommendations  Can patient physically be transported by private vehicle: No    Assistance Recommended at Discharge Frequent or constant Supervision/Assistance  Patient can return home with the following Two people to help with walking and/or transfers;A lot of help with bathing/dressing/bathroom;Assistance with cooking/housework;Direct supervision/assist for medications management;Direct supervision/assist for financial management;Assist for transportation;Help with stairs or ramp for entrance   Equipment Recommendations   None recommended by PT    Recommendations for Other Services       Precautions / Restrictions Precautions Precautions: Fall Restrictions Weight Bearing Restrictions: Yes RLE Weight Bearing: Partial weight bearing RLE Partial Weight Bearing Percentage or Pounds: 50     Mobility  Bed Mobility Overal bed mobility: Needs Assistance Bed Mobility: Supine to Sit, Sit to Supine     Supine to sit: Max assist, HOB elevated Sit to supine: Mod assist   General bed mobility comments: assist needed for RLE and elevation of trunk into sitting. Assist needed for mgmt of RLE with return to supine. Was able to scoot to R along EOB with mod A    Transfers Overall transfer level: Needs assistance Equipment used: Rolling walker (2 wheels) Transfers: Sit to/from Stand Sit to Stand: +2 safety/equipment, Mod assist           General transfer comment: pt adamant that she did not want to go to chair today and was not stepping feet well in standing. Mod A +2 needed for power up to RW. Performed multiple times for strengthening    Ambulation/Gait             Pre-gait activities: stepping in place, wt shifting. Pt having more difficulty shifting wt to R to pick up L foot General Gait Details: deferred due to pain   Stairs             Wheelchair Mobility    Modified Rankin (Stroke Patients Only)       Balance Overall balance assessment: Needs assistance Sitting-balance support: No upper extremity supported, Feet supported, Bilateral upper extremity supported Sitting balance-[name]: Fair Sitting balance - Comments: min guard dynamically Postural control: Posterior lean, Left lateral lean Standing balance support: Bilateral upper extremity supported,  During functional activity Standing balance-[name]: Poor Standing balance comment: relies on BUE and external support                            Cognition Arousal/Alertness: Awake/alert Behavior During  Therapy: Flat affect Overall Cognitive Status: Impaired/Different from baseline Area of Impairment: Memory, Following commands, Safety/judgement, Awareness, Problem solving, Attention                   Current Attention Level: Sustained Memory: Decreased recall of precautions, Decreased short-term memory Following Commands: Follows one step commands consistently, Follows one step commands with increased time, Follows multi-step commands inconsistently Safety/Judgement: Decreased awareness of safety, Decreased awareness of deficits Awareness: Emergent Problem Solving: Slow processing, Requires verbal cues, Requires tactile cues, Difficulty sequencing, Decreased initiation General Comments: pt with increased pain in RLE as well as throat today, not as motiavted to moblilize but still agreed to work [employer]        Exercises General Exercises - Lower Extremity Long Arc Quad: AAROM, Right, 10 reps, Seated Hip ABduction/ADduction: AAROM, Both (with hip adductor stretch)    General Comments General comments (skin integrity, edema, etc.): daughter present and assisting pt to eat breakfast. Also worked on eating when sitting EOB, pt able to feed herself some banana. Self feeding limited by tremor      Pertinent Vitals/Pain Pain Assessment Pain Assessment: Faces Faces Pain Scale: Hurts even more Pain Location: R hip to knee and throat Pain Descriptors / Indicators: Discomfort Pain Intervention(s): Limited activity within patient's tolerance, Monitored during session, Repositioned    Home Living                          Prior Function            PT Goals (current goals can now be found in the care plan section) Acute Rehab PT Goals Patient Stated Goal: daughter agreeable to SNF PT Goal Formulation: With patient Time For Goal Achievement: 11/09/22 Potential to Achieve Goals: Fair Progress towards PT goals: Not progressing toward goals - comment (pain)     Frequency    Min 3X/week      PT Plan Current plan remains appropriate;Frequency needs to be updated    Co-evaluation              AM-PAC PT "6 Clicks" Mobility   Outcome Measure  Help needed turning from your back to your side while in a flat bed without using bedrails?: A Lot Help needed moving from lying on your back to sitting on the side of a flat bed without using bedrails?: A Lot Help needed moving to and from a bed to a chair (including a wheelchair)?: A Lot Help needed standing up from a chair using your arms (e.g., wheelchair or bedside chair)?: A Lot Help needed to walk in hospital room?: A Lot Help needed climbing 3-5 steps with a railing? : Total 6 Click Score: 11    End of Session Equipment Utilized During Treatment: Gait belt Activity Tolerance: Patient tolerated treatment well Patient left: with call bell/phone within reach;with family/visitor present;in bed;with bed alarm set Nurse Communication: Mobility status PT Visit Diagnosis: Unsteadiness on feet (R26.81);Muscle weakness (generalized) (M62.81);Difficulty in walking, not elsewhere classified (R26.2);Pain Pain - Right/Left: Right Pain - part of body: Hip     Time: 2956-2130 PT Time Calculation (min) (ACUTE ONLY): 29 min  Charges:  $Therapeutic Activity: 23-37 mins  Lyanne Co, PT  Acute Rehab Services Secure chat preferred Office [phone]    Elyse Hsu 10/29/2022, 11:39 AM

## 2022-10-29 NOTE — Progress Notes (Signed)
PROGRESS NOTE    Leah Holmes  YNW:[id number] DOB: [birth year] DOA: 10/23/2022 PCP: Sherlene Shams, MD   Brief Narrative:  The patient is a 89 year old with a history of DM2, CAD, chronic atrial fibrillation on warfarin, HTN, HLD, anxiety/depression, remote treatment for breast cancer, and SSS status post pacemaker placement who presented to the [place] ER 5/10 with a 1 week history of HA and hallucinations. She had been evaluated by an Ophthalmologist in the outpatient setting who found no acute issues. She was transferred to [facility] ER from [facility] to undergo an MRI. CT head at presentation revealed no acute findings. CXR was unrevealing.  Head CT x 2 showed no acute findings carotid Dopplers without significant stenosis bilaterally.  Subsequently she had mechanical fall in the hospital which resulted in a comminuted displaced intertrochanteric fracture of the right femur and she underwent intramedullary nailing on 10/25/2022.  Patient is having trouble with swallowing pills so SLP will be consulted and PT OT recommending SNF.    Assessment and Plan: Mechanical fall in the hospital with resultant comminuted displaced intertrochanteric fracture of the right femur S/p intramedullary nailing 5/12 -Postoperative care per Orthopedic Surgery  -Warfarin resumed yesterday, with INR notably still within therapeutic range however will hold given ? Valve Vegetation -Continue with bowel regimen -Orthopedic surgery recommending advancing diet up with therapy and also recommending partial weightbearing in the right lower extremity with follow-up in 2 weeks recommending continue to keep the dressings in place unless soiled -PT OT recommending SNF and has a bed available   Persisting HA with visual hallucinations -Has been evaluated by Neurology - EEG unrevealing - CT head x2 w/o acute findings  -Carotid Dopplers without evidence of significant stenosis bilaterally  -No further inpatient w/u is felt to  be indicated and MRI was thought to be low yield in setting of non-focal sx w/ 2 negative CT  -Neuro opined this may be the initial onset of Lewy body dementia but after discussion with Neuro again will get MRI formally given ? Valve Vegetation -MRI and MRA done and showed "Brain MRI: Involuting brain without acute or reversible finding. MRA: No emergent finding. Atherosclerosis with moderate stenoses of the right M1 and left A2 segments." -Initiated a trial of low dose seroquel as suggested by Neuro but given her advanced age was begun at a low dose nightly only  -Dose will be held each night if the patient is sedated during the day but if she tolerates it without significant difficulty we could be increased to twice daily dosing   Chronic atrial fibrillation on warfarin Rate is well-controlled -unclear why patient is not on DOAC instead of warfarin (despite extensive review of outpatient records)  -cleared by Orthopedics to resume warfarin and this was done 10/27/22 (pharmacy to dose) -Continue monitor PT INR and was 25.4/2.3 respectively   Dysphagia -Obtain SLP evaluation and place on Soft Diet  -SLP evaluated and recommending continuing Dysphagia 3 diet with thin liquids   Hypokalemia -Patient's K+ Level Trend: Recent Labs  Lab 10/23/22 1339 10/25/22 0415 10/26/22 0414 10/27/22 0724 10/28/22 0814 10/29/22 0455  K 3.7 3.0* 3.5 3.5 4.5 4.0  -Continue to Monitor and Replete as Necessary -Repeat CMP in the AM    Poor Po Intake -Nutritionist Consulted and Initiated Feeding Supplements TID   Hypophosphatemia -Phos Level was 2.1 -Replete with IV Sodium Phos 15 mmol -Continue to Monitor and replete as Necessary -Repeat Phos Level in the AM   Hypomagnesemia -Patient's Mag Level Trend: Recent  Labs  Lab 10/26/22 0414 10/27/22 0724 10/28/22 0814 10/29/22 0455  MG 1.5* 2.1 1.9 1.8  -Continue to Monitor and Replete as Necessary -Repeat Mag in the AM    HTN -Adjustments made  in blood pressure regimen last night -BP at goal presently -avoid overaggressive correction given advanced age   CAD -Diffuse three-vessel CAD by cardiac cath 2012 with critical mid LAD stenosis not amenable to PCI -has declined follow-up testing as she would not wish to pursue intervention -Nno recent anginal symptoms with exertion   DM2 A1c 6.9 09/07/2022 - CBG reasonably well-controlled presently -monitor trend without change in treatment today -CBG Trend:  Recent Labs  Lab 10/28/22 1133 10/28/22 1655 10/28/22 2111 10/29/22 0623 10/29/22 0828 10/29/22 1121 10/29/22 1657  GLUCAP 227* 217* 302* 230* 175* 287* 242*    SSS status post pacemaker insertion 2017 -Will need MRI Pacer Protocol and Cardiology evaluation given Moderate to Severe Aortic Stenosis -Cardiology evaluated and recommending outpatient monitoring   HLD -Continue usual medical therapy   Aortic Stenosis -Graded as mild via TTE 2022 -Repeat ECHO this visit done and showed an EF of 60 to 65% with moderate left concentric hypertrophy and moderate to severe aortic stenosis present -Will formally consult cardiology for further evaluation recommendations and they reviewed the echo.  Dr. Bjorn Pippin feels that she will need outpatient monitoring for her aortic stenosis   ?  Aortic valve vegetation -TTE done and showed "Findings consistent with hypertrophic cardiomyopathy. Findings concerning for aortic valve vegetation, would recommend a Transesophageal Echocardiogram for clarification." -Consult cardiology formally Dr. Bjorn Pippin reviewed the echo and does not see a vegetation but feels that the aortic valve is heavily calcified -MRI as above -Cardiology recommending checking blood cultures if negative no further workup is recommended; blood cultures have been ordered   Hypoalbuminemia -Patient's Albumin Trend: Recent Labs  Lab 10/23/22 1339 10/28/22 0814 10/29/22 0455  ALBUMIN 4.0 2.9* 2.9*  -Continue to Monitor and  Trend and repeat CMP in the AM   DVT prophylaxis: SCDs Start: 10/25/22 1914 Place TED hose Start: 10/25/22 1914    Code Status: DNR Family Communication: Discussed with the patient's daughter at bedside  Disposition Plan:  Level of care: Med-Surg Status is: Inpatient Remains inpatient appropriate because: Needs further clinical improvement and anticipating discharging to SNF in next 24 to 48 hours   Consultants:  Cardiology Neurology  Procedures:  As delineated as above  Antimicrobials:  Anti-infectives (From admission, onward)    Start     Dose/Rate Route Frequency Ordered Stop   10/25/22 2200  ceFAZolin (ANCEF) IVPB 2g/100 mL premix        2 g 200 mL/hr over 30 Minutes Intravenous Every 6 hours 10/25/22 1913 10/26/22 0420   10/25/22 1407  ceFAZolin (ANCEF) 2-4 GM/100ML-% IVPB  Status:  Discontinued       Note to Pharmacy: Kathrene Bongo D: cabinet override      10/25/22 1407 10/25/22 1447   10/25/22 1400  ceFAZolin (ANCEF) IVPB 2g/100 mL premix        2 g 200 mL/hr over 30 Minutes Intravenous On call to O.R. 10/25/22 1336 10/25/22 1616       Subjective: Seen and examined at bedside and she was more awake and alert and complained that she did not get her blood pressure medication this morning.  States that she felt "terrible" and did not receive her breakfast tray just yet.  The patient's daughter thinks she is doing little bit better mentation wise.  No  other concerns or positive time.  Objective: Vitals:   10/29/22 0343 10/29/22 0816 10/29/22 1118 10/29/22 1535  BP: (!) 184/84 (!) 185/81 (!) 165/76 (!) 148/79  Pulse: 90 82 78 91  Resp: 17 17 20 16   Temp: 98.9 F (37.2 C) 98.4 F (36.9 C) 98.5 F (36.9 C) 98.9 F (37.2 C)  TempSrc: Oral Oral Oral Oral  SpO2: 97% 98% 97% 97%  Weight:      Height:        Intake/Output Summary (Last 24 hours) at 10/29/2022 1946 Last data filed at 10/29/2022 1800 Gross per 24 hour  Intake 603.16 ml  Output 500 ml  Net 103.16  ml   Filed Weights   10/23/22 2329 10/25/22 1443  Weight: 54.4 kg 54.4 kg   Examination: Physical Exam:  Constitutional: WN/WD elderly chronically ill-appearing Caucasian female who is slightly agitated Respiratory: Diminished to auscultation bilaterally, no wheezing, rales, rhonchi or crackles. Normal respiratory effort and patient is not tachypenic. No accessory muscle use.  Unlabored breathing Cardiovascular: RRR, 3/6 systolic murmur.  Abdomen: Soft, non-tender, non-distended. Bowel sounds positive.  GU: Deferred. Musculoskeletal: No clubbing / cyanosis of digits/nails. No joint deformity upper and lower extremities Skin: Bruising and ecchymosis noted in her right hip Neurologic: CN 2-12 grossly intact with no focal deficits. Romberg sign and cerebellar reflexes not assessed.  Psychiatric: Normal judgment and insight. Alert and oriented x 3.  Slightly anxious and agitated mood and appropriate affect.   Data Reviewed: I have personally reviewed following labs and imaging studies  CBC: Recent Labs  Lab 10/23/22 1339 10/25/22 0415 10/26/22 0414 10/27/22 0724 10/28/22 0814 10/29/22 0455  WBC 5.0 5.1 8.3 10.7* 11.1* 10.5  NEUTROABS 3.3  --   --   --  9.0* 8.3*  HGB 13.8 14.1 11.4* 11.1* 12.0 11.4*  HCT 41.4 42.8 34.6* 32.4* 34.0* 35.0*  MCV 95.0 94.1 95.6 93.6 94.4 95.9  PLT 223 229 166 190 206 224   Basic Metabolic Panel: Recent Labs  Lab 10/25/22 0415 10/26/22 0414 10/27/22 0724 10/28/22 0814 10/29/22 0455  NA 135 135 133* 135 132*  K 3.0* 3.5 3.5 4.5 4.0  CL 98 98 100 100 97*  CO2 28 26 27 28 25   GLUCOSE 136* 283* 205* 207* 244*  BUN 6* 14 23 23 17   CREATININE 0.61 0.69 0.65 0.59 0.51  CALCIUM 9.2 8.9 8.8* 8.7* 8.5*  MG  --  1.5* 2.1 1.9 1.8  PHOS  --   --   --  2.1* 2.6   GFR: Estimated Creatinine Clearance: 36.9 mL/min (by C-G formula based on SCr of 0.51 mg/dL). Liver Function Tests: Recent Labs  Lab 10/23/22 1339 10/28/22 0814 10/29/22 0455  AST 25  26 23   ALT 18 16 17   ALKPHOS 57 56 52  BILITOT 0.8 1.1 1.0  PROT 6.9 5.6* 5.6*  ALBUMIN 4.0 2.9* 2.9*   No results for input(s): "LIPASE", "AMYLASE" in the last 168 hours. No results for input(s): "AMMONIA" in the last 168 hours. Coagulation Profile: Recent Labs  Lab 10/24/22 1300 10/25/22 0415 10/27/22 0820 10/28/22 0814 10/29/22 0455  INR 2.0* 2.1* 2.7* 2.3* 2.5*   Cardiac Enzymes: No results for input(s): "CKTOTAL", "CKMB", "CKMBINDEX", "TROPONINI" in the last 168 hours. BNP (last 3 results) No results for input(s): "PROBNP" in the last 8760 hours. HbA1C: No results for input(s): "HGBA1C" in the last 72 hours. CBG: Recent Labs  Lab 10/28/22 2111 10/29/22 0623 10/29/22 0828 10/29/22 1121 10/29/22 1657  GLUCAP 302* 230*  175* 287* 242*   Lipid Profile: Recent Labs    10/29/22 0455  CHOL 133  HDL 52  LDLCALC 69  TRIG 59  CHOLHDL 2.6   Thyroid Function Tests: No results for input(s): "TSH", "T4TOTAL", "FREET4", "T3FREE", "THYROIDAB" in the last 72 hours. Anemia Panel: No results for input(s): "VITAMINB12", "FOLATE", "FERRITIN", "TIBC", "IRON", "RETICCTPCT" in the last 72 hours. Sepsis Labs: No results for input(s): "PROCALCITON", "LATICACIDVEN" in the last 168 hours.  Recent Results (from the past 240 hour(s))  Surgical pcr screen     Status: None   Collection Time: 10/25/22  1:54 PM   Specimen: Nasal Mucosa; Nasal Swab  Result Value Ref Range Status   MRSA, PCR NEGATIVE NEGATIVE Final   Staphylococcus aureus NEGATIVE NEGATIVE Final    Comment: (NOTE) The Xpert SA Assay (FDA approved for NASAL specimens in patients 17 years of age and older), is one component of a comprehensive surveillance program. It is not intended to diagnose infection nor to guide or monitor treatment. Performed at [facility], [address] [place], [place] [address]      Radiology Studies: MR BRAIN WO CONTRAST  Result Date: 10/29/2022 CLINICAL DATA:  Neuro deficit  with acute stroke suspected EXAM: MRI HEAD WITHOUT CONTRAST MRA HEAD WITHOUT CONTRAST TECHNIQUE: Multiplanar, multi-echo pulse sequences of the brain and surrounding structures were acquired without intravenous contrast. Angiographic images of the Circle of Willis were acquired using MRA technique without intravenous contrast. COMPARISON:  Head CT from 5 days ago FINDINGS: MRI HEAD FINDINGS Brain: No acute infarction, hemorrhage, hydrocephalus, extra-axial collection or mass lesion. Generalized atrophy. Fairly extensive chronic small vessel ischemia cerebral white matter with chronic lacune at the right caudate head. Vascular: Normal flow voids. Skull and upper cervical spine: Normal marrow signal. Sinuses/Orbits: No acute or significant finding. MRA HEAD FINDINGS Anterior circulation: Atheromatous irregularity of the carotid siphons with moderate stenosis at the right M1 segment. Additional moderate narrowing at the left A2 segment. No branch occlusion, beading, or aneurysm. Posterior circulation: Vertebral and basilar arteries are smoothly contoured and widely patent. Mild atheromatous irregularity of the medium size branches. Negative for aneurysm. No vascular malformation. IMPRESSION: Brain MRI: Involuting brain without acute or reversible finding. MRA: 1. No emergent finding. 2. Atherosclerosis with moderate stenoses of the right M1 and left A2 segments. Electronically Signed   By: Tiburcio Pea M.D.   On: 10/29/2022 16:04   MR ANGIO HEAD WO CONTRAST  Result Date: 10/29/2022 CLINICAL DATA:  Neuro deficit with acute stroke suspected EXAM: MRI HEAD WITHOUT CONTRAST MRA HEAD WITHOUT CONTRAST TECHNIQUE: Multiplanar, multi-echo pulse sequences of the brain and surrounding structures were acquired without intravenous contrast. Angiographic images of the Circle of Willis were acquired using MRA technique without intravenous contrast. COMPARISON:  Head CT from 5 days ago FINDINGS: MRI HEAD FINDINGS Brain: No  acute infarction, hemorrhage, hydrocephalus, extra-axial collection or mass lesion. Generalized atrophy. Fairly extensive chronic small vessel ischemia cerebral white matter with chronic lacune at the right caudate head. Vascular: Normal flow voids. Skull and upper cervical spine: Normal marrow signal. Sinuses/Orbits: No acute or significant finding. MRA HEAD FINDINGS Anterior circulation: Atheromatous irregularity of the carotid siphons with moderate stenosis at the right M1 segment. Additional moderate narrowing at the left A2 segment. No branch occlusion, beading, or aneurysm. Posterior circulation: Vertebral and basilar arteries are smoothly contoured and widely patent. Mild atheromatous irregularity of the medium size branches. Negative for aneurysm. No vascular malformation. IMPRESSION: Brain MRI: Involuting brain without acute  or reversible finding. MRA: 1. No emergent finding. 2. Atherosclerosis with moderate stenoses of the right M1 and left A2 segments. Electronically Signed   By: Tiburcio Pea M.D.   On: 10/29/2022 16:04    Scheduled Meds:  atenolol  25 mg Oral BID   docusate  100 mg Oral BID   feeding supplement  1 Container Oral TID BM   insulin aspart  0-9 Units Subcutaneous TID WC   isosorbide mononitrate  60 mg Oral BID   lisinopril  40 mg Oral BID   neomycin-polymyxin b-dexamethasone  1 drop Both Eyes BID   polyethylene glycol  17 g Oral BID   QUEtiapine  12.5 mg Oral QHS   Continuous Infusions:  methocarbamol (ROBAXIN) IV      LOS: 4 days   Merlene Laughter, DO [facility] Available via Epic secure chat 7am-7pm After these hours, please refer to coverage provider listed on [email or web address] 10/29/2022, 7:46 PM

## 2022-10-29 NOTE — Consult Note (Addendum)
Cardiology Consultation   Patient ID: Maddyn Shird MRN: [id number]; DOB: Nov 27, 1926  Admit date: 10/23/2022 Date of Consult: 10/29/2022  PCP:  Sherlene Shams, MD   [facility] Providers Cardiologist:  New to Dr Bjorn Pippin   Patient Profile:   Delaiah Pam Hausser is a 89 y.o. female with a hx of type 2 DM, chronic A fb on coumadin, sick sinus syndrome status post pacemaker implant, diastolic heart failure,HTN, HLD, remote left breast cancer status post lumpectomy and radiation, essential tremor, who is being seen 10/29/2022 for the evaluation of valvular disease at the request of Dr Marland Mcalpine.  History of Present Illness:   Per chart review, Ms. Eden with above past medical history presented [facility] on 10/23/2022 with complaints of headache and visual hallucination over 1 week. She was evaluated by ophthalmology outpatient had no acute issues on workup. She was seen by neurology here. Initial CT head on 10/23/2022 no acute ICH or infarct, suspected 4 mm partially calcified meningioma overlying the left parietal lobe (unchanged from CT 05/21/2022), chronic lacunar infarct within the right basal ganglia/internal capsule, mild cerebral white matter chronic small vessel ischemic disease, generalized parenchymal atrophy.  Repeat CT head on 10/24/2022 without acute change.  EEG on 10/24/2022 showed normal finding.  Neuro had initiated a trial of low-dose Seroquel with suspected Lewy body dementia.   Echocardiogram on 10/26/22 showed LVEF 60 to 65%, no regional wall motion abnormality, moderate LVH, indeterminate diastolic parameter, normal RV, moderate elevated PASP with RVSP 58.3 mmHg, moderate LAE and RAE, moderate to severe mitral regurgitation with MV peak gradient 9.1 mmHg and mean mitral valve gradient 2 mmHg, moderate mitral stenosis, moderate to severe tricuspid regurgitation, severe aortic stenosis with mean gradient 19.6 mmHg and peak gradient 34.1 mmHg, trivial AI, mild  grade 2 atheroma plaque involving ascending aorta.  Aortic valve vegetation was suspected.  Neuro follow-up on 10/28/2022 recommend inpatient MRA and MRI given persistent confusion as well as possible aortic vegetation.  Neuro had recommended to continue hold Coumadin given echocardiogram finding.  Cardiology is consulted today for valvular disease.  Also, she suffered a mechanical fall with resultant comminuted displaced intertrochanteric fracture of right femur while in the hospital.  She underwent intramedullary nailing of right hip on 10/25/2022 by Dr. Charlann Boxer.   Upon encounter, patient returned from MRI. Her daughter is at bedside to assist history telling. Daughter states patient is not confused and is at her baseline mentation. Patient states she feels overall bad and feels "out of it". She denied any fever /chills, cough, emesis, chest pain. She endorses dizziness, chronic SOB with exertion. She is able to walk with walker at baseline, able to do her own laundry, and needs assistance for shower. She had no recent hospitalization or treatment for any infection over the past 2 years. She has no hx of any autoimmune disease. She came in because progressing headache that has improved. She had PPM placed 3-4 years ago by [facility] cardiology. She has A fib and takes coumadin for this. She denied hx of MI. She is on every other day diuretic at baseline for CHF.     Past Medical History:  Diagnosis Date   Atrial fibrillation (HCC) 2012   Breast cancer (HCC) 04/2018   IDC of left breast    CHF (congestive heart failure) (HCC)    Colon polyp 2003   Compression fracture of L2 lumbar vertebra (HCC)    Coronary artery disease    COVID-19 virus infection 12/25/2019  Diabetes mellitus without complication (HCC)    Dyspnea    Dysrhythmia    atrial fib   Environmental and seasonal allergies    GERD (gastroesophageal reflux disease)    Hyperlipidemia    Hypertension    Hypertensive urgency 03/10/2021    Irritable bowel syndrome    Multiple rib fractures 03/15/2018   Personal history of radiation therapy    Polyp of colon    PONV (postoperative nausea and vomiting)    with ether, not recently "woke up in surgery once"   Presence of permanent cardiac pacemaker    Tremor    Tremors of nervous system    Ulcer     Past Surgical History:  Procedure Laterality Date   ABDOMINAL ADHESION SURGERY     ABDOMINAL HYSTERECTOMY     APPENDECTOMY     BREAST BIOPSY Right late 80s/early 90s   benign   BREAST BIOPSY Left 03/31/2018   [place] bx done with Dr. Lemar Livings, invasive ductal carcinoma   BREAST LUMPECTOMY Left 04/22/2018   Procedure: BREAST LUMPECTOMY;  Surgeon: Earline Mayotte, MD;  Location: [facility] ORS;  Service: General;  Laterality: Left;   BREAST LUMPECTOMY Left 04/22/2018   [facility], negative LN, clear margins   BREAST LUMPECTOMY Left 2020   positive, done in office   BREAST SURGERY Right    biopsy    CESAREAN SECTION     x 2   CHOLECYSTECTOMY  03/2012   Dr Excell Seltzer   COLONOSCOPY  2003   Dr Fritzi Mandes in [place]   fibroid tumor     INTRAMEDULLARY (IM) NAIL INTERTROCHANTERIC Right 10/25/2022   Procedure: INTRAMEDULLARY (IM) NAIL INTERTROCHANTERIC;  Surgeon: Durene Romans, MD;  Location: [facility] OR;  Service: Orthopedics;  Laterality: Right;   PACEMAKER INSERTION N/A 01/28/2016   Procedure: INSERTION PACEMAKER;  Surgeon: Marcina Millard, MD;  Location: [facility] ORS;  Service: Cardiovascular;  Laterality: N/A;   SENTINEL NODE BIOPSY Left 04/22/2018   Procedure: SENTINEL NODE BIOPSY;  Surgeon: Earline Mayotte, MD;  Location: [facility] ORS;  Service: General;  Laterality: Left;   UPPER GI ENDOSCOPY  2003   Dr Fritzi Mandes in [place]     Home Medications:  Prior to Admission medications   Medication Sig Start Date End Date Taking? Authorizing Provider  acetaminophen (TYLENOL) 500 MG tablet Take 1,000 mg by mouth every 6 (six) hours as needed for moderate pain, fever or headache.   Yes [provider]  amLODipine (NORVASC) 10 MG tablet Take 1 tablet (10 mg total) by mouth daily. Patient taking differently: Take 10 mg by mouth daily as needed (sBP > 140 after all medications have been taken). 09/07/22  Yes Sherlene Shams, MD  atenolol (TENORMIN) 25 MG tablet Take 25 mg by mouth 2 (two) times daily. 07/01/21  Yes [provider]  hydrochlorothiazide (HYDRODIURIL) 25 MG tablet Take 25 mg by mouth every other day.   Yes [provider]  isosorbide mononitrate (IMDUR) 60 MG 24 hr tablet Take 60 mg by mouth 2 (two) times daily.   Yes [provider]  lisinopril (ZESTRIL) 40 MG tablet Take 1 tablet (40 mg total) by mouth 2 (two) times daily. 09/07/22  Yes Sherlene Shams, MD  meclizine (ANTIVERT) 25 MG tablet Take 1 tablet by mouth three times daily as needed Patient taking differently: Take 25 mg by mouth 3 (three) times daily as needed for dizziness. 07/01/22  Yes Sherlene Shams, MD  neomycin-polymyxin b-dexamethasone (MAXITROL) 3.5-10000-0.1 SUSP Place 1 drop into both  eyes 2 (two) times daily. 10 day course. 10/22/22 11/01/22 Yes [provider]  nitroGLYCERIN (NITROSTAT) 0.4 MG SL tablet Place 0.4 mg under the tongue every 5 (five) minutes as needed for chest pain.   Yes [provider]  Polyethyl Glycol-Propyl Glycol (SYSTANE OP) Place 1 drop into both eyes 4 (four) times daily as needed (dry eyes).   Yes [provider]  warfarin (COUMADIN) 2 MG tablet Take 2-4 mg by mouth See admin instructions. 2 mg every Sunday and Wednesday night at 2000. Take 4 mg at 2000 on all other days.   Yes [provider]  glucose blood (ONE TOUCH ULTRA TEST) test strip USE 1 STRIP TO CHECK GLUCOSE TWICE DAILY  Dx code: E11.9 05/29/19   Sherlene Shams, MD  glucose blood (ONE TOUCH ULTRA TEST) test strip USE ONE STRIP TO CHECK GLUCOSE ONCE  DAILY 06/02/19   Sherlene Shams, MD  glucose blood test strip Use to check blood sugars once daily. 09/01/21   Sherlene Shams, MD  Lancets (ONETOUCH DELICA PLUS [place]) MISC Place 2 Devices inside cheek daily. DX Code E11.9. 09/01/21   Sherlene Shams, MD    Inpatient Medications: Scheduled Meds:  atenolol  25 mg Oral BID   docusate  100 mg Oral BID   feeding supplement  1 Container Oral TID BM   insulin aspart  0-9 Units Subcutaneous TID WC   isosorbide mononitrate  60 mg Oral BID   lisinopril  40 mg Oral BID   neomycin-polymyxin b-dexamethasone  1 drop Both Eyes BID   polyethylene glycol  17 g Oral BID   QUEtiapine  12.5 mg Oral QHS   Continuous Infusions:  methocarbamol (ROBAXIN) IV     PRN Meds: acetaminophen, bisacodyl, HYDROmorphone (DILAUDID) injection, labetalol, meclizine, menthol-cetylpyridinium **OR** phenol, methocarbamol **OR** methocarbamol (ROBAXIN) IV, oxyCODONE, polyvinyl alcohol  Allergies:    Allergies  Allergen Reactions   Codeine Anaphylaxis   Penicillins Anaphylaxis   Clonidine Derivatives Other (See Comments)    Unknown reaction   Cozaar [Losartan] Other (See Comments)    Myalgias    Lipitor [Atorvastatin] Other (See Comments)    Unknown reaction   Ms Contin [Morphine] Other (See Comments)    Unknown reaction   Neurontin [Gabapentin] Nausea Only   Reglan [Metoclopramide] Other (See Comments)    Tremors    Valtrex [Valacyclovir] Other (See Comments)    Tremors    Aldactone [Spironolactone] Itching   Apresoline [Hydralazine] Anxiety   Norvasc [Amlodipine] Itching    Does cause itching. Pt does take PRN if needed.    Social History:   Social History   Socioeconomic History   Marital status: Widowed    Spouse name: Not on file   Number of children: Not on file   Years of education: Not on file   Highest education level: 6th grade  Occupational History   Not on file  Tobacco Use   Smoking status: Never   Smokeless tobacco: Former    Types: Snuff   Tobacco comments:    occasionally  Vaping Use   Vaping Use: Never used  Substance and Sexual Activity    Alcohol use: No   Drug use: No   Sexual activity: Never  Other Topics Concern   Not on file  Social History Narrative   Not on file   Social Determinants of Health   Financial Resource Strain: Low Risk  (09/03/2022)   Overall Financial Resource Strain (CARDIA)    Difficulty  of Paying Living Expenses: Not hard at all  Food Insecurity: No Food Insecurity (10/24/2022)   Hunger Vital Sign    Worried About Running Out of Food in the Last Year: Never true    Ran Out of Food in the Last Year: Never true  Transportation Needs: No Transportation Needs (10/24/2022)   PRAPARE - Administrator, Civil Service (Medical): No    Lack of Transportation (Non-Medical): No  Physical Activity: Unknown (09/03/2022)   Exercise Vital Sign    Days of Exercise per Week: 0 days    Minutes of Exercise per Session: Not on file  Stress: No Stress Concern Present (09/03/2022)   [employer] of Occupational Health - Occupational Stress Questionnaire    Feeling of Stress : Only a little  Social Connections: Unknown (09/03/2022)   Social Connection and Isolation Panel [NHANES]    Frequency of Communication with Friends and Family: Patient declined    Frequency of Social Gatherings with Friends and Family: Patient declined    Attends Religious Services: Patient declined    Database administrator or Organizations: No    Attends Engineer, structural: Not on file    Marital Status: Widowed  Intimate Partner Violence: Not At Risk (10/24/2022)   Humiliation, Afraid, Rape, and Kick questionnaire    Fear of Current or Ex-Partner: No    Emotionally Abused: No    Physically Abused: No    Sexually Abused: No    Family History:    Family History  Problem Relation Age of Onset   Breast cancer Sister    Cancer Sister 7       breast   Breast cancer Maternal Aunt 68   Breast cancer Cousin    Breast cancer Other 59     ROS:  Constitutional: Denied fever, chills, malaise, night  sweats Eyes: Denied vision change or loss Ears/Nose/Mouth/Throat: Denied ear ache, sore throat, coughing, sinus pain Cardiovascular: see HPI  Respiratory: see HPI  Gastrointestinal: + RLQ abdominal pain  Genital/Urinary: Denied dysuria, hematuria, urinary frequency/urgency Musculoskeletal: Denied muscle ache, joint pain, weakness Skin: Denied rash, wound Neuro: dizziness  Psych: history of depression/anxiety  Endocrine: history of diabetes   Physical Exam/Data:   Vitals:   10/29/22 0343 10/29/22 0816 10/29/22 1118 10/29/22 1535  BP: (!) 184/84 (!) 185/81 (!) 165/76 (!) 148/79  Pulse: 90 82 78 91  Resp: 17 17 20 16   Temp: 98.9 F (37.2 C) 98.4 F (36.9 C) 98.5 F (36.9 C) 98.9 F (37.2 C)  TempSrc: Oral Oral Oral Oral  SpO2: 97% 98% 97% 97%  Weight:      Height:        Intake/Output Summary (Last 24 hours) at 10/29/2022 1556 Last data filed at 10/29/2022 1541 Gross per 24 hour  Intake 255.16 ml  Output 500 ml  Net -244.84 ml      10/25/2022    2:43 PM 10/23/2022   11:29 PM 10/23/2022    1:30 PM  Last 3 Weights  Weight (lbs) 119 lb 14.9 oz 119 lb 14.9 oz 120 lb  Weight (kg) 54.4 kg 54.4 kg 54.432 kg     Body mass index is 19.37 kg/m.  Vitals:  Vitals:   10/29/22 1118 10/29/22 1535  BP: (!) 165/76 (!) 148/79  Pulse: 78 91  Resp: 20 16  Temp: 98.5 F (36.9 C) 98.9 F (37.2 C)  SpO2: 97% 97%   General Appearance: In no apparent distress, sitting on the commode  HEENT:  Normocephalic, atraumatic.  Neck: Supple, trachea midline, no JVDs. Cardiovascular: Regular rate and rhythm, normal S1-S2,  grade II systolic murmur noted at apex and RUSB Respiratory: Resting breathing unlabored, lungs sounds clear to auscultation bilaterally, no use of accessory muscles. On room air.  No wheezes, rales or rhonchi.   Gastrointestinal: Bowel sounds positive, abdomen soft, non-tender  Extremities: Able to move all extremities in bed without difficulty, no edema of  BLE Musculoskeletal: Normal muscle bulk and tone Skin: Intact, warm, dry. No rashes or petechiae noted in exposed areas.  Neurologic: Alert, oriented to person, place and time. No cognitive deficit Psychiatric: Normal affect. Mood is appropriate.   EKG:  The EKG was personally reviewed and demonstrates:   EKG from 10/24/2022 showed V paced rhythm  Telemetry:  Telemetry was personally reviewed and demonstrates:   Not on telemetry  Relevant CV Studies:  Echocardiogram from 10/26/2022:  1. Left ventricular ejection fraction, by estimation, is 60 to 65%. The  left ventricle has normal function. The left ventricle has no regional  wall motion abnormalities. There is moderate concentric left ventricular  hypertrophy. Left ventricular  diastolic parameters are indeterminate.   2. Right ventricular systolic function is normal. The right ventricular  size is normal. There is moderately elevated pulmonary artery systolic  pressure.   3. Left atrial size was moderately dilated.   4. Right atrial size was moderately dilated.   5. The mitral valve is degenerative. Moderate to severe mitral valve  regurgitation. Moderate mitral stenosis. Moderate to severe mitral annular  calcification.   6. Tricuspid valve regurgitation is moderate to severe.   7. Cannot exclude vegetation on AV. The aortic valve is tricuspid. There  is severe calcifcation of the aortic valve. There is severe thickening of  the aortic valve. Aortic valve regurgitation is trivial. Moderate to  severe aortic valve stenosis.   8. There is mild (Grade II) atheroma plaque involving the ascending  aorta.   9. The inferior vena cava is normal in size with <50% respiratory  variability, suggesting right atrial pressure of 8 mmHg.   Conclusion(s)/Recommendation(s): Findings consistent with hypertrophic  cardiomyopathy. Findings concerning for aortic valve vegetation, would  recommend a Transesophageal Echocardiogram for  clarification.    Laboratory Data:  High Sensitivity Troponin:  No results for input(s): "TROPONINIHS" in the last 720 hours.   Chemistry Recent Labs  Lab 10/27/22 0724 10/28/22 0814 10/29/22 0455  NA 133* 135 132*  K 3.5 4.5 4.0  CL 100 100 97*  CO2 27 28 25   GLUCOSE 205* 207* 244*  BUN 23 23 17   CREATININE 0.65 0.59 0.51  CALCIUM 8.8* 8.7* 8.5*  MG 2.1 1.9 1.8  GFRNONAA >60 >60 >60  ANIONGAP 6 7 10     Recent Labs  Lab 10/23/22 1339 10/28/22 0814 10/29/22 0455  PROT 6.9 5.6* 5.6*  ALBUMIN 4.0 2.9* 2.9*  AST 25 26 23   ALT 18 16 17   ALKPHOS 57 56 52  BILITOT 0.8 1.1 1.0   Lipids  Recent Labs  Lab 10/29/22 0455  CHOL 133  TRIG 59  HDL 52  LDLCALC 69  CHOLHDL 2.6    Hematology Recent Labs  Lab 10/27/22 0724 10/28/22 0814 10/29/22 0455  WBC 10.7* 11.1* 10.5  RBC 3.46* 3.60* 3.65*  HGB 11.1* 12.0 11.4*  HCT 32.4* 34.0* 35.0*  MCV 93.6 94.4 95.9  MCH 32.1 33.3 31.2  MCHC 34.3 35.3 32.6  RDW 13.2 13.2 13.3  PLT 190 206 224   Thyroid  Recent  Labs  Lab 10/24/22 1300  TSH 0.986    BNPNo results for input(s): "BNP", "PROBNP" in the last 168 hours.  DDimer No results for input(s): "DDIMER" in the last 168 hours.   Radiology/Studies:  ECHOCARDIOGRAM COMPLETE  Result Date: 10/26/2022    ECHOCARDIOGRAM REPORT   Patient Name:   AMAYAH CRIVELLI Winfree Date of Exam: 10/26/2022 Medical Rec #:  [id number]               Height:       66.0 in Accession #:    [id number]              Weight:       119.9 lb Date of Birth:  1926/12/14               BSA:          1.609 m Patient Age:    89 years                BP:           141/82 mmHg Patient Gender: F                       HR:           55 bpm. Exam Location:  Inpatient Procedure: 2D Echo, Color Doppler and Cardiac Doppler Indications:     R01.1 Murmur  History:         Patient has prior history of Echocardiogram examinations, most                  recent 06/11/2021. CAD, Abnormal ECG and Pacemaker, Aortic                   Valve Disease, Arrythmias:Atrial Fibrillation,                  Signs/Symptoms:Alzheimer's and Altered Mental Status; Risk                  Factors:Hypertension, Diabetes and Dyslipidemia. Breast cancer.                  Aortic stenosis.  Sonographer:     Sheralyn Boatman RDCS Referring Phys:  [id number] Orland Mustard Diagnosing Phys: Orpah Cobb MD  Sonographer Comments: Technically difficult study due to poor echo windows. Patient supine, post hip surgery. Could not turn. IMPRESSIONS  1. Left ventricular ejection fraction, by estimation, is 60 to 65%. The left ventricle has normal function. The left ventricle has no regional wall motion abnormalities. There is moderate concentric left ventricular hypertrophy. Left ventricular diastolic parameters are indeterminate.  2. Right ventricular systolic function is normal. The right ventricular size is normal. There is moderately elevated pulmonary artery systolic pressure.  3. Left atrial size was moderately dilated.  4. Right atrial size was moderately dilated.  5. The mitral valve is degenerative. Moderate to severe mitral valve regurgitation. Moderate mitral stenosis. Moderate to severe mitral annular calcification.  6. Tricuspid valve regurgitation is moderate to severe.  7. Cannot exclude vegetation on AV. The aortic valve is tricuspid. There is severe calcifcation of the aortic valve. There is severe thickening of the aortic valve. Aortic valve regurgitation is trivial. Moderate to severe aortic valve stenosis.  8. There is mild (Grade II) atheroma plaque involving the ascending aorta.  9. The inferior vena cava is normal in size with <50% respiratory variability, suggesting right atrial pressure of 8 mmHg. Conclusion(s)/Recommendation(s): Findings consistent with hypertrophic cardiomyopathy. Findings  concerning for aortic valve vegetation, would recommend a Transesophageal Echocardiogram for clarification. FINDINGS  Left Ventricle: Left ventricular ejection fraction,  by estimation, is 60 to 65%. The left ventricle has normal function. The left ventricle has no regional wall motion abnormalities. The left ventricular internal cavity size was normal in size. There is  moderate concentric left ventricular hypertrophy. Left ventricular diastolic parameters are indeterminate. Right Ventricle: The right ventricular size is normal. No increase in right ventricular wall thickness. Right ventricular systolic function is normal. There is moderately elevated pulmonary artery systolic pressure. The tricuspid regurgitant velocity is 3.29 m/s, and with an assumed right atrial pressure of 15 mmHg, the estimated right ventricular systolic pressure is 58.3 mmHg. Left Atrium: Left atrial size was moderately dilated. Right Atrium: Right atrial size was moderately dilated. Pericardium: There is no evidence of pericardial effusion. Mitral Valve: The mitral valve is degenerative in appearance. There is moderate thickening of the mitral valve leaflet(s). There is severe calcification of the posterior mitral valve leaflet(s). Moderately decreased mobility of the mitral valve leaflets.  Moderate to severe mitral annular calcification. Moderate to severe mitral valve regurgitation. Moderate mitral valve stenosis. MV peak gradient, 9.1 mmHg. The mean mitral valve gradient is 2.0 mmHg. Tricuspid Valve: The tricuspid valve is normal in structure. Tricuspid valve regurgitation is moderate to severe. Aortic Valve: Cannot exclude vegetation on AV. The aortic valve is tricuspid. There is severe calcifcation of the aortic valve. There is severe thickening of the aortic valve. There is moderate to severe aortic valve annular calcification. Aortic valve regurgitation is trivial. Moderate to severe aortic stenosis is present. Aortic valve mean gradient measures 19.6 mmHg. Aortic valve peak gradient measures 34.1 mmHg. Pulmonic Valve: The pulmonic valve was normal in structure. Pulmonic valve regurgitation is mild.  Aorta: The aortic root is normal in size and structure. There is mild (Grade II) atheroma plaque involving the ascending aorta. Venous: The inferior vena cava is normal in size with less than 50% respiratory variability, suggesting right atrial pressure of 8 mmHg. IAS/Shunts: The atrial septum is grossly normal. Additional Comments: A device lead is visualized in the right atrium and right ventricle.  LEFT VENTRICLE PLAX 2D LVIDd:         3.00 cm     Diastology LVIDs:         2.00 cm     LV e' medial:    5.63 cm/s LV PW:         1.70 cm     LV E/e' medial:  24.9 LV IVS:        1.80 cm     LV e' lateral:   12.70 cm/s                            LV E/e' lateral: 11.0  LV Volumes (MOD) LV vol d, MOD A2C: 53.3 ml LV vol d, MOD A4C: 46.6 ml LV vol s, MOD A2C: 17.8 ml LV vol s, MOD A4C: 18.6 ml LV SV MOD A2C:     35.5 ml LV SV MOD A4C:     46.6 ml LV SV MOD BP:      31.3 ml RIGHT VENTRICLE            IVC RV S prime:     7.62 cm/s  IVC diam: 2.00 cm TAPSE (M-mode): 1.1 cm LEFT ATRIUM             Index  RIGHT ATRIUM           Index LA diam:        4.20 cm 2.61 cm/m   RA Area:     19.40 cm LA Vol (A2C):   50.4 ml 31.32 ml/m  RA Volume:   53.20 ml  33.06 ml/m LA Vol (A4C):   40.2 ml 24.98 ml/m LA Biplane Vol: 46.5 ml 28.90 ml/m  AORTIC VALVE                    PULMONIC VALVE AV Vmax:           291.80 cm/s  PV Vmax:          3.31 m/s AV Vmean:          206.600 cm/s PV Peak grad:     43.8 mmHg AV VTI:            0.657 m      PR End Diast Vel: 1.44 msec AV Peak Grad:      34.1 mmHg AV Mean Grad:      19.6 mmHg LVOT Vmax:         135.00 cm/s LVOT Vmean:        91.200 cm/s LVOT VTI:          0.243 m LVOT/AV VTI ratio: 0.37  AORTA Ao Root diam: 3.10 cm Ao Asc diam:  3.10 cm MITRAL VALVE                TRICUSPID VALVE MV Area (PHT): 2.95 cm     TR Peak grad:   43.3 mmHg MV Peak grad:  9.1 mmHg     TR Vmax:        329.00 cm/s MV Mean grad:  2.0 mmHg MV Vmax:       1.50 m/s     SHUNTS MV Vmean:      66.6 cm/s    Systemic  VTI: 0.24 m MV Decel Time: 257 msec MV E velocity: 140.00 cm/s Orpah Cobb MD Electronically signed by Orpah Cobb MD Signature Date/Time: 10/26/2022/3:46:01 PM    Final    DG Pelvis Portable  Result Date: 10/25/2022 CLINICAL DATA:  Postop. EXAM: PORTABLE PELVIS 1-2 VIEWS COMPARISON:  Preoperative imaging. FINDINGS: Intramedullary nail with trans trochanteric and distal locking screw fixation traverse comminuted intertrochanteric femur fracture. Improved fracture alignment from preoperative imaging with mild residual displacement of the lesser trochanteric fragment. Recent postsurgical change includes air and edema in the soft tissues. IMPRESSION: ORIF of comminuted intertrochanteric femur fracture. No immediate postoperative complication. Electronically Signed   By: Narda Rutherford M.D.   On: 10/25/2022 17:20   DG HIP UNILAT WITH PELVIS 2-3 VIEWS RIGHT  Result Date: 10/25/2022 CLINICAL DATA:  Elective surgery. EXAM: DG HIP (WITH OR WITHOUT PELVIS) 2-3V RIGHT COMPARISON:  Preoperative imaging. FINDINGS: Three fluoroscopic spot views of the pelvis and right hip obtained in the operating room. Interval short intramedullary nail with trans trochanteric and distal locking screw fixation of intertrochanteric femur fracture. Fluoroscopy time 1 minutes 17 seconds. Dose 10.2 mGy. IMPRESSION: Intraoperative fluoroscopy for intertrochanteric femur fracture ORIF. Electronically Signed   By: Narda Rutherford M.D.   On: 10/25/2022 17:19   DG C-Arm 1-60 Min-No Report  Result Date: 10/25/2022 Fluoroscopy was utilized by the requesting physician.  No radiographic interpretation.     Assessment and Plan:   Chronic diastolic heart failure  Moderate to severe mitral regurgitation Moderate mitral stenosis  Moderate to severe tricuspid regurgitation  Severe aortic stenosis Possible aortic vegetation Hx of PPM implant  -Aortic vegetation suspected per TTE on 10/26/2022.  On review, valve appears heavily calcified  but no clear vegetation seen.  Would check blood cultures, no further work-up recommended if negative -Recommend obtain blood culture x2, monitor fever curve, hold Tylenol -Agree with MRI of brain  -She does not appear in decompensated CHF, monitor fluid status - continue PTA amlodipine, atenolol, imdur, lisinopril for BP control   A fib - chronic, on atenolol and coumadin PTA, may resume home meds if no contraindication     Risk Assessment/Risk Scores:   New York Heart Association (NYHA) Functional Class NYHA Class II  CHA2DS2-VASc Score = 9  This indicates a 12.2% annual risk of stroke. The patient's score is based upon: CHF History: 1 HTN History: 1 Diabetes History: 1 Stroke History: 2 Vascular Disease History: 1 Age Score: 2 Gender Score: 1    For questions or updates, please contact [facility] Please consult [email or web address] for contact info under    Signed, Cyndi Bender, NP  10/29/2022 3:56 PM  Patient seen and examined.  Agree with above documentation.  Ms. Vandevander is a 89 year old female with a history of chronic atrial fibrillation, sick sinus syndrome status post pacemaker, chronic diastolic heart failure, breast cancer, T2DM who we are consulted by Dr. Marland Mcalpine for evaluation of aortic valve disease.  She initially presented to the ED on 10/23/2022 with headache.  She was seen by neurology, head CT showed no acute abnormalities.  EEG unremarkable.  EKG shows atrial fibrillation, ventricular paced rhythm, rate 60.  Echocardiogram 10/26/2022 showed EF 60 to 65%, moderate LVH, normal RV function, moderate biatrial enlargement, moderate to severe mitral regurgitation, moderate mitral stenosis, moderate to severe tricuspid regurgitation, cannot exclude aortic valve vegetation, moderate to severe aortic stenosis.  She had a fall in the hospital and suffered a right hip fracture, status post intramedullary nailing 5/12.  On exam, patient is alert and oriented, regular rate and  rhythm, 3/6 systolic murmur, lungs CTAB, no LE edema.  On my review of the echo, appeared only mild mitral regurgitation, no mitral stenosis (mean gradient 2 mmHg), moderate aortic stenosis (V-max 3.1 m/s, mean gradient 21 mmHg, DI 0.32).  I do not see a vegetation, but aortic valve is heavily calcified.  Would check blood cultures.  If negative, no further workup recommended.  Will need outpatient monitoring for aortic stenosis.  Little Ishikawa, MD

## 2022-10-29 NOTE — Plan of Care (Signed)
Alert, oriented. Daughter remained at bedside during the day. MRI completed, awaiting results. Right hip with bruising/ecchymosis noted, bruising marked. Dr. Marland Mcalpine made aware during rounds. Able to work with PT/OT today. Bowel movement noted today.   Problem: Education: Goal: Ability to describe self-care measures that may prevent or decrease complications (Diabetes Survival Skills Education) will improve Outcome: Progressing Goal: Individualized Educational Video(s) Outcome: Progressing   Problem: Coping: Goal: Ability to adjust to condition or change in health will improve Outcome: Progressing   Problem: Fluid Volume: Goal: Ability to maintain a balanced intake and output will improve Outcome: Progressing   Problem: Health Behavior/Discharge Planning: Goal: Ability to identify and utilize available resources and services will improve Outcome: Progressing Goal: Ability to manage health-related needs will improve Outcome: Progressing   Problem: Metabolic: Goal: Ability to maintain appropriate glucose levels will improve Outcome: Progressing   Problem: Nutritional: Goal: Maintenance of adequate nutrition will improve Outcome: Progressing Goal: Progress toward achieving an optimal weight will improve Outcome: Progressing

## 2022-10-30 ENCOUNTER — Other Ambulatory Visit (HOSPITAL_COMMUNITY): Payer: Self-pay

## 2022-10-30 DIAGNOSIS — E43 Unspecified severe protein-calorie malnutrition: Secondary | ICD-10-CM | POA: Insufficient documentation

## 2022-10-30 DIAGNOSIS — R441 Visual hallucinations: Secondary | ICD-10-CM | POA: Diagnosis not present

## 2022-10-30 DIAGNOSIS — I10 Essential (primary) hypertension: Secondary | ICD-10-CM | POA: Diagnosis not present

## 2022-10-30 DIAGNOSIS — I482 Chronic atrial fibrillation, unspecified: Secondary | ICD-10-CM | POA: Diagnosis not present

## 2022-10-30 DIAGNOSIS — R5381 Other malaise: Secondary | ICD-10-CM | POA: Diagnosis not present

## 2022-10-30 DIAGNOSIS — R519 Headache, unspecified: Secondary | ICD-10-CM | POA: Diagnosis not present

## 2022-10-30 DIAGNOSIS — I350 Nonrheumatic aortic (valve) stenosis: Secondary | ICD-10-CM | POA: Diagnosis not present

## 2022-10-30 LAB — GLUCOSE, CAPILLARY
Glucose-Capillary: 194 mg/dL — ABNORMAL HIGH (ref 70–99)
Glucose-Capillary: 323 mg/dL — ABNORMAL HIGH (ref 70–99)
Glucose-Capillary: 103 mg/dL — ABNORMAL HIGH (ref 70–99)
Glucose-Capillary: 214 mg/dL — ABNORMAL HIGH (ref 70–99)

## 2022-10-30 LAB — CBC WITH DIFFERENTIAL/PLATELET
Abs Immature Granulocytes: 0.04 10*3/uL (ref 0.00–0.07)
Basophils Absolute: 0 10*3/uL (ref 0.0–0.1)
Basophils Relative: 0 %
Eosinophils Absolute: 0.1 10*3/uL (ref 0.0–0.5)
Eosinophils Relative: 1 %
HCT: 33.7 % — ABNORMAL LOW (ref 36.0–46.0)
Hemoglobin: 11.3 g/dL — ABNORMAL LOW (ref 12.0–15.0)
Immature Granulocytes: 1 %
Lymphocytes Relative: 11 %
Lymphs Abs: 1 10*3/uL (ref 0.7–4.0)
MCH: 32 pg (ref 26.0–34.0)
MCHC: 33.5 g/dL (ref 30.0–36.0)
MCV: 95.5 fL (ref 80.0–100.0)
Monocytes Absolute: 1.1 10*3/uL — ABNORMAL HIGH (ref 0.1–1.0)
Monocytes Relative: 13 %
Neutro Abs: 6.5 10*3/uL (ref 1.7–7.7)
Neutrophils Relative %: 74 %
Platelets: 251 10*3/uL (ref 150–400)
RBC: 3.53 MIL/uL — ABNORMAL LOW (ref 3.87–5.11)
RDW: 13.5 % (ref 11.5–15.5)
WBC: 8.7 10*3/uL (ref 4.0–10.5)
nRBC: 0 % (ref 0.0–0.2)

## 2022-10-30 LAB — COMPREHENSIVE METABOLIC PANEL
ALT: 19 U/L (ref 0–44)
AST: 23 U/L (ref 15–41)
Albumin: 2.7 g/dL — ABNORMAL LOW (ref 3.5–5.0)
Alkaline Phosphatase: 57 U/L (ref 38–126)
Anion gap: 9 (ref 5–15)
BUN: 16 mg/dL (ref 8–23)
CO2: 28 mmol/L (ref 22–32)
Calcium: 8.5 mg/dL — ABNORMAL LOW (ref 8.9–10.3)
Chloride: 97 mmol/L — ABNORMAL LOW (ref 98–111)
Creatinine, Ser: 0.54 mg/dL (ref 0.44–1.00)
GFR, Estimated: >60 mL/min (ref >60)
Glucose, Bld: 234 mg/dL — ABNORMAL HIGH (ref 70–99)
Potassium: 3.9 mmol/L (ref 3.5–5.1)
Sodium: 134 mmol/L — ABNORMAL LOW (ref 135–145)
Total Bilirubin: 1.1 mg/dL (ref 0.3–1.2)
Total Protein: 5.5 g/dL — ABNORMAL LOW (ref 6.5–8.1)

## 2022-10-30 LAB — CULTURE, BLOOD (ROUTINE X 2)

## 2022-10-30 LAB — PHOSPHORUS: Phosphorus: 2.2 mg/dL — ABNORMAL LOW (ref 2.5–4.6)

## 2022-10-30 LAB — MAGNESIUM: Magnesium: 1.8 mg/dL (ref 1.7–2.4)

## 2022-10-30 LAB — PROTIME-INR
INR: 2.3 — ABNORMAL HIGH (ref 0.8–1.2)
Prothrombin Time: 25.3 seconds — ABNORMAL HIGH (ref 11.4–15.2)

## 2022-10-30 MED ORDER — WARFARIN - PHARMACIST DOSING INPATIENT: Freq: Every day | Status: DC

## 2022-10-30 MED ORDER — ENSURE ENLIVE PO LIQD
237.0000 mL | Freq: Three times a day (TID) | ORAL | Status: DC
  Administered 2022-10-30 – 2022-10-31 (×4): 237 mL via ORAL

## 2022-10-30 MED ORDER — K PHOS MONO-SOD PHOS DI & MONO 155-852-130 MG PO TABS
500.0000 mg | ORAL_TABLET | Freq: Two times a day (BID) | ORAL | Status: AC
  Administered 2022-10-30 (×2): 500 mg via ORAL
  Filled 2022-10-30 (×2): qty 2

## 2022-10-30 MED ORDER — MAGNESIUM SULFATE 2 GM/50ML IV SOLN
2.0000 g | Freq: Once | INTRAVENOUS | Status: AC
  Administered 2022-10-30: 2 g via INTRAVENOUS
  Filled 2022-10-30: qty 50

## 2022-10-30 MED ORDER — ADULT MULTIVITAMIN W/MINERALS CH
1.0000 | ORAL_TABLET | Freq: Every day | ORAL | Status: DC
  Administered 2022-10-30 – 2022-11-02 (×4): 1 via ORAL
  Filled 2022-10-30 (×4): qty 1

## 2022-10-30 MED ORDER — WARFARIN SODIUM 4 MG PO TABS
4.0000 mg | ORAL_TABLET | Freq: Once | ORAL | Status: AC
  Administered 2022-10-30: 4 mg via ORAL
  Filled 2022-10-30: qty 1

## 2022-10-30 NOTE — Progress Notes (Signed)
PROGRESS NOTE    Leah Holmes  YNW:[id number] DOB: 1926/10/06 DOA: 10/23/2022 PCP: Sherlene Shams, MD   Brief Narrative:  The patient is a 89 year old with a history of DM2, CAD, chronic atrial fibrillation on warfarin, HTN, HLD, anxiety/depression, remote treatment for breast cancer, and SSS status post pacemaker placement who presented to the [place] ER 5/10 with a 1 week history of HA and hallucinations. She had been evaluated by an Ophthalmologist in the outpatient setting who found no acute issues. She was transferred to [facility] ER from [facility] to undergo an MRI. CT head at presentation revealed no acute findings. CXR was unrevealing. Head CT x 2 showed no acute findings carotid Dopplers without significant stenosis bilaterally. Subsequently she had mechanical fall in the hospital which resulted in a comminuted displaced intertrochanteric fracture of the right femur and she underwent intramedullary nailing on 10/25/2022. Patient is having trouble with swallowing pills so SLP will be consulted and PT OT recommending SNF.   Assessment and Plan: Mechanical fall in the hospital with resultant comminuted displaced intertrochanteric fracture of the right femur S/p intramedullary nailing 5/12 -Postoperative care per Orthopedic Surgery  -Warfarin resumed yesterday, with INR notably still within therapeutic range however will hold given ? Valve Vegetation -Continue with bowel regimen -Orthopedic surgery recommending advancing diet up with therapy and also recommending partial weightbearing in the right lower extremity with follow-up in 2 weeks recommending continue to keep the dressings in place unless soiled -PT OT recommending SNF and has a bed available anticipating discharging on Monday morning after cultures are negative for 48 hours   Persisting HA with visual hallucinations improving -Has been evaluated by Neurology - EEG unrevealing - CT head x2 w/o acute findings  -Carotid Dopplers  without evidence of significant stenosis bilaterally  -No further inpatient w/u is felt to be indicated and MRI was thought to be low yield in setting of non-focal sx w/ 2 negative CT  -Neuro opined this may be the initial onset of Lewy body dementia but after discussion with Neuro again will get MRI formally given ? Valve Vegetation -MRI and MRA done and showed "Brain MRI: Involuting brain without acute or reversible finding. MRA: No emergent finding. Atherosclerosis with moderate stenoses of the right M1 and left A2 segments." -Initiated a trial of low dose seroquel as suggested by Neuro but given her advanced age was begun at a low dose nightly only  -Dose will be held each night if the patient is sedated during the day but if she tolerates it without significant difficulty we could be increased to twice daily dosing   Chronic atrial fibrillation on warfarin Rate is well-controlled  -Unclear why patient is not on DOAC instead of warfarin (despite extensive review of outpatient records); Family checking with Primary Cardiologist for pharmacy review she does not have prescription medication coverage found which is likely the reason because cost may have been a barrier to switching to DOAC in the past: The cardiologist here recommended considering switching to Eliquis 2.5 mg twice daily given her age and weight but for now we will resume the home Coumadin -cleared by Orthopedics to resume warfarin and this was done 10/27/22 (pharmacy to dose) and was held again on the 16th but now resumed and she will get 4 mg p.o. x 1 tonight -Continue monitor PT INR and was [phone] respectively   Dysphagia -Obtain SLP evaluation and place on Soft Diet  -SLP evaluated and recommending continuing Dysphagia 3 diet with thin liquids  Hypokalemia -Patient's K+ Level Trend: Recent Labs  Lab 10/23/22 1339 10/25/22 0415 10/26/22 0414 10/27/22 0724 10/28/22 0814 10/29/22 0455 10/30/22 0730  K 3.7 3.0* 3.5 3.5  4.5 4.0 3.9  -Continue to Monitor and Replete as Necessary -Repeat CMP in the AM    Poor Po Intake -Nutritionist Consulted and Initiated Feeding Supplements TID and see below   Hypophosphatemia -Phos Level Trend Recent Labs  Lab 10/28/22 0814 10/29/22 0455 10/30/22 0730  PHOS 2.1* 2.6 2.2*  -Replete with po K Phos Neutral 500 mg x2 -Continue to Monitor and replete as Necessary -Repeat Phos Level in the AM   Hypomagnesemia -Patient's Mag Level Trend: Recent Labs  Lab 10/26/22 0414 10/27/22 0724 10/28/22 0814 10/29/22 0455 10/30/22 0730  MG 1.5* 2.1 1.9 1.8 1.8  -Continue to Monitor and Replete as Necessary -Repeat Mag in the AM    HTN -Adjustments made in blood pressure regimen last night -BP at goal presently -avoid overaggressive correction given advanced age -Continue to Monitor BP per Protocol -Last BP reading is a little elevated at 160/52   CAD -Diffuse three-vessel CAD by cardiac cath 2012 with critical mid LAD stenosis not amenable to PCI -has declined follow-up testing as she would not wish to pursue intervention -Nno recent anginal symptoms with exertion   DM2 A1c 6.9 09/07/2022 - CBG reasonably well-controlled presently -monitor trend without change in treatment today -CBG Trend:  Recent Labs  Lab 10/29/22 0828 10/29/22 1121 10/29/22 1657 10/29/22 2117 10/30/22 0621 10/30/22 1202 10/30/22 1712  GLUCAP 175* 287* 242* 274* 194* 323* 103*    SSS status post pacemaker insertion 2017 -Will need MRI Pacer Protocol and Cardiology evaluation given Moderate to Severe Aortic Stenosis -Cardiology evaluated and recommending outpatient monitoring   HLD -Continue usual medical therapy   Aortic Stenosis -Graded as mild via TTE 2022 -Repeat ECHO this visit done and showed an EF of 60 to 65% with moderate left concentric hypertrophy and moderate to severe aortic stenosis present -Will formally consult cardiology for further evaluation recommendations and they  reviewed the echo.  Dr. Bjorn Pippin feels that she will need outpatient monitoring for her aortic stenosis and they are going to monitor as an outpatient   ?  Aortic valve vegetation -TTE done and showed "Findings consistent with hypertrophic cardiomyopathy. Findings concerning for aortic valve vegetation, would recommend a Transesophageal Echocardiogram for clarification." -Consult cardiology formally Dr. Bjorn Pippin reviewed the echo and does not see a vegetation but feels that the aortic valve is heavily calcified -MRI as above -Cardiology recommending checking blood cultures if negative no further workup is recommended; blood cultures have been ordered and cardiology feels if they are no growth at 48 hours then she can be discharged to SNF   Hypoalbuminemia -Patient's Albumin Trend: Recent Labs  Lab 10/23/22 1339 10/28/22 0814 10/29/22 0455 10/30/22 0730  ALBUMIN 4.0 2.9* 2.9* 2.7*  -Continue to Monitor and Trend and repeat CMP in the AM   Severe Protein Calorie Malnutrition -Nutrition Status: Nutrition Problem: Severe Malnutrition (in the context of chronic illness) Etiology:  (inadequate energy intake) Signs/Symptoms: severe fat depletion, severe muscle depletion Interventions: Refer to RD note for recommendations  DVT prophylaxis: SCDs Start: 10/25/22 1914 Place TED hose Start: 10/25/22 1914    Code Status: DNR Family Communication: Discussed with Daughter at bedside  Disposition Plan:  Level of care: Med-Surg Status is: Inpatient Remains inpatient appropriate because: Needs further clinical improvement and anticipating discharging in next 48 hours if blood cultures are negative at least 2  days  Consultants:  Cardiology Neurology  Procedures:  As delineated above  Antimicrobials:  Anti-infectives (From admission, onward)    Start     Dose/Rate Route Frequency Ordered Stop   10/25/22 2200  ceFAZolin (ANCEF) IVPB 2g/100 mL premix        2 g 200 mL/hr over 30 Minutes  Intravenous Every 6 hours 10/25/22 1913 10/26/22 0420   10/25/22 1407  ceFAZolin (ANCEF) 2-4 GM/100ML-% IVPB  Status:  Discontinued       Note to Pharmacy: Kathrene Bongo D: cabinet override      10/25/22 1407 10/25/22 1447   10/25/22 1400  ceFAZolin (ANCEF) IVPB 2g/100 mL premix        2 g 200 mL/hr over 30 Minutes Intravenous On call to O.R. 10/25/22 1336 10/25/22 1616       Subjective: Seen and examined at bedside she is a little somnolent and drowsy as she just received some pain medication and muscle relaxer together.  When asked how she is doing.  States "terrible".  Denies any nausea or vomiting.  No other concerns or close this time.  Objective: Vitals:   10/30/22 0458 10/30/22 0734 10/30/22 1142 10/30/22 1740  BP: (!) 163/76 (!) 167/65 133/64 (!) 160/52  Pulse:  90 71 77  Resp: 16 17 17 20   Temp: 98.3 F (36.8 C) 98 F (36.7 C) 98.4 F (36.9 C) 98 F (36.7 C)  TempSrc: Oral Oral Oral Oral  SpO2: 98% 98% 99% 98%  Weight:      Height:        Intake/Output Summary (Last 24 hours) at 10/30/2022 1810 Last data filed at 10/30/2022 1745 Gross per 24 hour  Intake 957 ml  Output 1000 ml  Net -43 ml   Filed Weights   10/23/22 2329 10/25/22 1443  Weight: 54.4 kg 54.4 kg   Examination: Physical Exam:  Constitutional: Thin elderly chronically ill-appearing Caucasian female who is little somnolent and drowsy Respiratory: Diminished to auscultation bilaterally, no wheezing, rales, rhonchi or crackles. Normal respiratory effort and patient is not tachypenic. No accessory muscle use.  Unlabored breathing Cardiovascular: RRR, has a 3 out of 6 systolic murmur.  Abdomen: Soft, non-tender, non-distended. Bowel sounds positive.  GU: Deferred. Musculoskeletal: No clubbing / cyanosis of digits/nails. No joint deformity upper and lower extremities.  Skin: No rashes, lesions, ulcers. No induration; Warm and dry.  Neurologic: CN 2-12 grossly intact with no focal deficits.  She is a  little somnolent and drowsy but easily arousable Psychiatric: She is little anxious  Data Reviewed: I have personally reviewed following labs and imaging studies  CBC: Recent Labs  Lab 10/26/22 0414 10/27/22 0724 10/28/22 0814 10/29/22 0455 10/30/22 0730  WBC 8.3 10.7* 11.1* 10.5 8.7  NEUTROABS  --   --  9.0* 8.3* 6.5  HGB 11.4* 11.1* 12.0 11.4* 11.3*  HCT 34.6* 32.4* 34.0* 35.0* 33.7*  MCV 95.6 93.6 94.4 95.9 95.5  PLT 166 190 206 224 251   Basic Metabolic Panel: Recent Labs  Lab 10/26/22 0414 10/27/22 0724 10/28/22 0814 10/29/22 0455 10/30/22 0730  NA 135 133* 135 132* 134*  K 3.5 3.5 4.5 4.0 3.9  CL 98 100 100 97* 97*  CO2 26 27 28 25 28   GLUCOSE 283* 205* 207* 244* 234*  BUN 14 23 23 17 16   CREATININE 0.69 0.65 0.59 0.51 0.54  CALCIUM 8.9 8.8* 8.7* 8.5* 8.5*  MG 1.5* 2.1 1.9 1.8 1.8  PHOS  --   --  2.1* 2.6  2.2*   GFR: Estimated Creatinine Clearance: 36.9 mL/min (by C-G formula based on SCr of 0.54 mg/dL). Liver Function Tests: Recent Labs  Lab 10/28/22 0814 10/29/22 0455 10/30/22 0730  AST 26 23 23   ALT 16 17 19   ALKPHOS 56 52 57  BILITOT 1.1 1.0 1.1  PROT 5.6* 5.6* 5.5*  ALBUMIN 2.9* 2.9* 2.7*   No results for input(s): "LIPASE", "AMYLASE" in the last 168 hours. No results for input(s): "AMMONIA" in the last 168 hours. Coagulation Profile: Recent Labs  Lab 10/25/22 0415 10/27/22 0820 10/28/22 0814 10/29/22 0455 10/30/22 0730  INR 2.1* 2.7* 2.3* 2.5* 2.3*   Cardiac Enzymes: No results for input(s): "CKTOTAL", "CKMB", "CKMBINDEX", "TROPONINI" in the last 168 hours. BNP (last 3 results) No results for input(s): "PROBNP" in the last 8760 hours. HbA1C: No results for input(s): "HGBA1C" in the last 72 hours. CBG: Recent Labs  Lab 10/29/22 1657 10/29/22 2117 10/30/22 0621 10/30/22 1202 10/30/22 1712  GLUCAP 242* 274* 194* 323* 103*   Lipid Profile: Recent Labs    10/29/22 0455  CHOL 133  HDL 52  LDLCALC 69  TRIG 59  CHOLHDL 2.6    Thyroid Function Tests: No results for input(s): "TSH", "T4TOTAL", "FREET4", "T3FREE", "THYROIDAB" in the last 72 hours. Anemia Panel: No results for input(s): "VITAMINB12", "FOLATE", "FERRITIN", "TIBC", "IRON", "RETICCTPCT" in the last 72 hours. Sepsis Labs: No results for input(s): "PROCALCITON", "LATICACIDVEN" in the last 168 hours.  Recent Results (from the past 240 hour(s))  Surgical pcr screen     Status: None   Collection Time: 10/25/22  1:54 PM   Specimen: Nasal Mucosa; Nasal Swab  Result Value Ref Range Status   MRSA, PCR NEGATIVE NEGATIVE Final   Staphylococcus aureus NEGATIVE NEGATIVE Final    Comment: (NOTE) The Xpert SA Assay (FDA approved for NASAL specimens in patients 33 years of age and older), is one component of a comprehensive surveillance program. It is not intended to diagnose infection nor to guide or monitor treatment. Performed at [facility], [address], [place], [place] [address]   Culture, blood (Routine X 2) w Reflex to ID Panel     Status: None (Preliminary result)   Collection Time: 10/29/22  6:54 PM   Specimen: BLOOD  Result Value Ref Range Status   Specimen Description BLOOD SITE NOT SPECIFIED  Final   Special Requests   Final    BOTTLES DRAWN AEROBIC AND ANAEROBIC Blood Culture results may not be optimal due to an excessive volume of blood received in culture bottles   Culture   Final    NO GROWTH < 12 HOURS Performed at [facility], [address], [place], [place] [address]    Report Status PENDING  Incomplete  Culture, blood (Routine X 2) w Reflex to ID Panel     Status: None (Preliminary result)   Collection Time: 10/29/22  6:58 PM   Specimen: BLOOD RIGHT ARM  Result Value Ref Range Status   Specimen Description BLOOD RIGHT ARM  Final   Special Requests   Final    BOTTLES DRAWN AEROBIC AND ANAEROBIC Blood Culture results may not be optimal due to an excessive volume of blood received in culture bottles   Culture    Final    NO GROWTH < 12 HOURS Performed at [facility], [address], [place], [place] [address]    Report Status PENDING  Incomplete    Radiology Studies: MR BRAIN WO CONTRAST  Result  Date: 10/29/2022 CLINICAL DATA:  Neuro deficit with acute stroke suspected EXAM: MRI HEAD WITHOUT CONTRAST MRA HEAD WITHOUT CONTRAST TECHNIQUE: Multiplanar, multi-echo pulse sequences of the brain and surrounding structures were acquired without intravenous contrast. Angiographic images of the Circle of Willis were acquired using MRA technique without intravenous contrast. COMPARISON:  Head CT from 5 days ago FINDINGS: MRI HEAD FINDINGS Brain: No acute infarction, hemorrhage, hydrocephalus, extra-axial collection or mass lesion. Generalized atrophy. Fairly extensive chronic small vessel ischemia cerebral white matter with chronic lacune at the right caudate head. Vascular: Normal flow voids. Skull and upper cervical spine: Normal marrow signal. Sinuses/Orbits: No acute or significant finding. MRA HEAD FINDINGS Anterior circulation: Atheromatous irregularity of the carotid siphons with moderate stenosis at the right M1 segment. Additional moderate narrowing at the left A2 segment. No branch occlusion, beading, or aneurysm. Posterior circulation: Vertebral and basilar arteries are smoothly contoured and widely patent. Mild atheromatous irregularity of the medium size branches. Negative for aneurysm. No vascular malformation. IMPRESSION: Brain MRI: Involuting brain without acute or reversible finding. MRA: 1. No emergent finding. 2. Atherosclerosis with moderate stenoses of the right M1 and left A2 segments. Electronically Signed   By: Tiburcio Pea M.D.   On: 10/29/2022 16:04   MR ANGIO HEAD WO CONTRAST  Result Date: 10/29/2022 CLINICAL DATA:  Neuro deficit with acute stroke suspected EXAM: MRI HEAD WITHOUT CONTRAST MRA HEAD WITHOUT CONTRAST TECHNIQUE: Multiplanar, multi-echo pulse sequences of the brain and  surrounding structures were acquired without intravenous contrast. Angiographic images of the Circle of Willis were acquired using MRA technique without intravenous contrast. COMPARISON:  Head CT from 5 days ago FINDINGS: MRI HEAD FINDINGS Brain: No acute infarction, hemorrhage, hydrocephalus, extra-axial collection or mass lesion. Generalized atrophy. Fairly extensive chronic small vessel ischemia cerebral white matter with chronic lacune at the right caudate head. Vascular: Normal flow voids. Skull and upper cervical spine: Normal marrow signal. Sinuses/Orbits: No acute or significant finding. MRA HEAD FINDINGS Anterior circulation: Atheromatous irregularity of the carotid siphons with moderate stenosis at the right M1 segment. Additional moderate narrowing at the left A2 segment. No branch occlusion, beading, or aneurysm. Posterior circulation: Vertebral and basilar arteries are smoothly contoured and widely patent. Mild atheromatous irregularity of the medium size branches. Negative for aneurysm. No vascular malformation. IMPRESSION: Brain MRI: Involuting brain without acute or reversible finding. MRA: 1. No emergent finding. 2. Atherosclerosis with moderate stenoses of the right M1 and left A2 segments. Electronically Signed   By: Tiburcio Pea M.D.   On: 10/29/2022 16:04    Scheduled Meds:  atenolol  25 mg Oral BID   docusate  100 mg Oral BID   feeding supplement  237 mL Oral TID BM   insulin aspart  0-9 Units Subcutaneous TID WC   isosorbide mononitrate  60 mg Oral BID   lisinopril  40 mg Oral BID   multivitamin with minerals  1 tablet Oral Daily   neomycin-polymyxin b-dexamethasone  1 drop Both Eyes BID   phosphorus  500 mg Oral BID   polyethylene glycol  17 g Oral BID   QUEtiapine  12.5 mg Oral QHS   Warfarin - Pharmacist Dosing Inpatient   Does not apply q1600   Continuous Infusions:  methocarbamol (ROBAXIN) IV      LOS: 5 days   Marguerita Merles, DO [facility] Available via  Epic secure chat 7am-7pm After these hours, please refer to coverage provider listed on [email or web address] 10/30/2022, 6:10 PM

## 2022-10-30 NOTE — TOC Progression Note (Signed)
Transition of Care [facility]) - Progression Note    Patient Details  Name: Leah Holmes MRN: [id number] Date of Birth: 04/08/1927  [facility]) CM/SW Contact  Baldemar [place], [place] Phone Number: 10/30/2022, 1:56 PM  Clinical Narrative:   CSW spoke with Clide Cliff at [facility] and provided medical update. CSW to follow.    Expected Discharge Plan: Skilled Nursing Facility Barriers to Discharge: Continued Medical Work up, Facility will not accept until restraint criteria met  Expected Discharge Plan and Services     Post Acute Care Choice: Skilled Nursing Facility Living arrangements for the past 2 months: Single Family Home                                       Social Determinants of Health (SDOH) Interventions SDOH Screenings   Food Insecurity: No Food Insecurity (10/24/2022)  Housing: Low Risk  (10/24/2022)  Transportation Needs: No Transportation Needs (10/24/2022)  Utilities: Not At Risk (10/24/2022)  Depression (PHQ2-9): Medium Risk (10/23/2022)  Financial Resource Strain: Low Risk  (09/03/2022)  Physical Activity: Unknown (09/03/2022)  Social Connections: Unknown (09/03/2022)  Stress: No Stress Concern Present (09/03/2022)  Tobacco Use: Medium Risk (10/26/2022)    Readmission Risk Interventions     No data to display

## 2022-10-30 NOTE — Progress Notes (Signed)
ANTICOAGULATION CONSULT NOTE  Pharmacy Consult for Warfarin Indication: atrial fibrillation  Allergies  Allergen Reactions   Codeine Anaphylaxis   Penicillins Anaphylaxis   Clonidine Derivatives Other (See Comments)    Unknown reaction   Cozaar [Losartan] Other (See Comments)    Myalgias    Lipitor [Atorvastatin] Other (See Comments)    Unknown reaction   Ms Contin [Morphine] Other (See Comments)    Unknown reaction   Neurontin [Gabapentin] Nausea Only   Reglan [Metoclopramide] Other (See Comments)    Tremors    Valtrex [Valacyclovir] Other (See Comments)    Tremors    Aldactone [Spironolactone] Itching   Apresoline [Hydralazine] Anxiety   Norvasc [Amlodipine] Itching    Does cause itching. Pt does take PRN if needed.    Patient Measurements: Height: 5' 5.98" (167.6 cm) Weight: 54.4 kg (119 lb 14.9 oz) IBW/kg (Calculated) : 59.26  Vital Signs: Temp: 98.4 F (36.9 C) (05/17 1142) Temp Source: Oral (05/17 1142) BP: 133/64 (05/17 1142) Pulse Rate: 71 (05/17 1142)  Labs: Recent Labs    10/28/22 0814 10/29/22 0455 10/30/22 0730  HGB 12.0 11.4* 11.3*  HCT 34.0* 35.0* 33.7*  PLT 206 224 251  LABPROT 25.4* 27.0* 25.3*  INR 2.3* 2.5* 2.3*  CREATININE 0.59 0.51 0.54     Estimated Creatinine Clearance: 36.9 mL/min (by C-G formula based on SCr of 0.54 mg/dL).   Medical History: Past Medical History:  Diagnosis Date   Atrial fibrillation (HCC) 2012   Breast cancer (HCC) 04/2018   IDC of left breast    CHF (congestive heart failure) (HCC)    Colon polyp 2003   Compression fracture of L2 lumbar vertebra (HCC)    Coronary artery disease    COVID-19 virus infection 12/25/2019   Diabetes mellitus without complication (HCC)    Dyspnea    Dysrhythmia    atrial fib   Environmental and seasonal allergies    GERD (gastroesophageal reflux disease)    Hyperlipidemia    Hypertension    Hypertensive urgency 03/10/2021   Irritable bowel syndrome    Multiple rib  fractures 03/15/2018   Personal history of radiation therapy    Polyp of colon    PONV (postoperative nausea and vomiting)    with ether, not recently "woke up in surgery once"   Presence of permanent cardiac pacemaker    Tremor    Tremors of nervous system    Ulcer     Medications:  Medications Prior to Admission  Medication Sig Dispense Refill Last Dose   acetaminophen (TYLENOL) 500 MG tablet Take 1,000 mg by mouth every 6 (six) hours as needed for moderate pain, fever or headache.   10/23/2022   amLODipine (NORVASC) 10 MG tablet Take 1 tablet (10 mg total) by mouth daily. (Patient taking differently: Take 10 mg by mouth daily as needed (sBP > 140 after all medications have been taken).) 30 tablet 0 Past Week   atenolol (TENORMIN) 25 MG tablet Take 25 mg by mouth 2 (two) times daily.   10/23/2022 at 0845   hydrochlorothiazide (HYDRODIURIL) 25 MG tablet Take 25 mg by mouth every other day.   Past Week   isosorbide mononitrate (IMDUR) 60 MG 24 hr tablet Take 60 mg by mouth 2 (two) times daily.   10/23/2022   lisinopril (ZESTRIL) 40 MG tablet Take 1 tablet (40 mg total) by mouth 2 (two) times daily. 180 tablet 0 10/23/2022   meclizine (ANTIVERT) 25 MG tablet Take 1 tablet by mouth three times daily  as needed (Patient taking differently: Take 25 mg by mouth 3 (three) times daily as needed for dizziness.) 40 tablet 5 Past Month   neomycin-polymyxin b-dexamethasone (MAXITROL) 3.5-10000-0.1 SUSP Place 1 drop into both eyes 2 (two) times daily. 10 day course.   10/23/2022   nitroGLYCERIN (NITROSTAT) 0.4 MG SL tablet Place 0.4 mg under the tongue every 5 (five) minutes as needed for chest pain.   UNK   Polyethyl Glycol-Propyl Glycol (SYSTANE OP) Place 1 drop into both eyes 4 (four) times daily as needed (dry eyes).   10/23/2022   warfarin (COUMADIN) 2 MG tablet Take 2-4 mg by mouth See admin instructions. 2 mg every Sunday and Wednesday night at 2000. Take 4 mg at 2000 on all other days.   10/22/2022 at  2000   glucose blood (ONE TOUCH ULTRA TEST) test strip USE 1 STRIP TO CHECK GLUCOSE TWICE DAILY  Dx code: E11.9 200 each 3    glucose blood (ONE TOUCH ULTRA TEST) test strip USE ONE STRIP TO CHECK GLUCOSE ONCE  DAILY 100 each 3    glucose blood test strip Use to check blood sugars once daily. 100 each 12    Lancets (ONETOUCH DELICA PLUS LANCET33G) MISC Place 2 Devices inside cheek daily. DX Code E11.9. 100 each 2     Assessment: 89 yo F on warfarin PTA for afib.  PTA dose is warfarin 4mg  daily except 2mg  on Sun and Weds.  Warfarin was held for 48 hours around the timing of ortho surgery and again for MRI to r/o CVA.  Per Dr. Marland Mcalpine, ok to resume anticoagulation.  INR 2.3 today.    Asked pharmacy team to check copay for DOAC for possible medication change.  No prescription medication coverage found.  Perhaps cost has been a barrier to switching to DOAC in the past.  Goal of Therapy:  INR 2-3 Monitor platelets by anticoagulation protocol: Yes   Plan:  Warfarin 4mg  PO x 1 tonight Daily INR  [employer], Pharm.D., BCPS Clinical Pharmacist Clinical phone for 10/30/2022 from 7:30-3:00 is [phone].  **Pharmacist phone directory can be found on [email or web address] listed under [facility].  10/30/2022 1:03 PM

## 2022-10-30 NOTE — Progress Notes (Signed)
Rounding Note    Patient Name: Leah Holmes Date of Encounter: 10/30/2022  [facility] Cardiologist: None   Subjective   Denies any chest pain or dyspnea  Inpatient Medications    Scheduled Meds:  atenolol  25 mg Oral BID   docusate  100 mg Oral BID   feeding supplement  237 mL Oral TID BM   insulin aspart  0-9 Units Subcutaneous TID WC   isosorbide mononitrate  60 mg Oral BID   lisinopril  40 mg Oral BID   multivitamin with minerals  1 tablet Oral Daily   neomycin-polymyxin b-dexamethasone  1 drop Both Eyes BID   phosphorus  500 mg Oral BID   polyethylene glycol  17 g Oral BID   QUEtiapine  12.5 mg Oral QHS   Continuous Infusions:  magnesium sulfate bolus IVPB     methocarbamol (ROBAXIN) IV     PRN Meds: acetaminophen, bisacodyl, HYDROmorphone (DILAUDID) injection, labetalol, meclizine, menthol-cetylpyridinium **OR** phenol, methocarbamol **OR** methocarbamol (ROBAXIN) IV, oxyCODONE, polyvinyl alcohol   Vital Signs    Vitals:   10/29/22 1959 10/29/22 2333 10/30/22 0458 10/30/22 0734  BP: (!) 164/81 (!) 127/51 (!) 163/76 (!) 167/65  Pulse: 77 83  90  Resp: 16 16 16 17   Temp: 98.3 F (36.8 C) 98.6 F (37 C) 98.3 F (36.8 C) 98 F (36.7 C)  TempSrc: Oral Oral Oral Oral  SpO2: 98% 97% 98% 98%  Weight:      Height:        Intake/Output Summary (Last 24 hours) at 10/30/2022 1058 Last data filed at 10/30/2022 0458 Gross per 24 hour  Intake 238 ml  Output 700 ml  Net -462 ml      10/25/2022    2:43 PM 10/23/2022   11:29 PM 10/23/2022    1:30 PM  Last 3 Weights  Weight (lbs) 119 lb 14.9 oz 119 lb 14.9 oz 120 lb  Weight (kg) 54.4 kg 54.4 kg 54.432 kg      Telemetry    Not on telemetry - Personally Reviewed  ECG    NO new ECG - Personally Reviewed  Physical Exam   GEN: No acute distress.   Neck: No JVD Cardiac: irregular, normal rate, 3/6 systolic murmur Respiratory: Clear to auscultation bilaterally. GI: Soft,  nontender MS: No edema; No deformity. Neuro:  Nonfocal  Psych: Normal affect   Labs    High Sensitivity Troponin:  No results for input(s): "TROPONINIHS" in the last 720 hours.   Chemistry Recent Labs  Lab 10/28/22 0814 10/29/22 0455 10/30/22 0730  NA 135 132* 134*  K 4.5 4.0 3.9  CL 100 97* 97*  CO2 28 25 28   GLUCOSE 207* 244* 234*  BUN 23 17 16   CREATININE 0.59 0.51 0.54  CALCIUM 8.7* 8.5* 8.5*  MG 1.9 1.8 1.8  PROT 5.6* 5.6* 5.5*  ALBUMIN 2.9* 2.9* 2.7*  AST 26 23 23   ALT 16 17 19   ALKPHOS 56 52 57  BILITOT 1.1 1.0 1.1  GFRNONAA >60 >60 >60  ANIONGAP 7 10 9     Lipids  Recent Labs  Lab 10/29/22 0455  CHOL 133  TRIG 59  HDL 52  LDLCALC 69  CHOLHDL 2.6    Hematology Recent Labs  Lab 10/28/22 0814 10/29/22 0455 10/30/22 0730  WBC 11.1* 10.5 8.7  RBC 3.60* 3.65* 3.53*  HGB 12.0 11.4* 11.3*  HCT 34.0* 35.0* 33.7*  MCV 94.4 95.9 95.5  MCH 33.3 31.2 32.0  MCHC 35.3 32.6  33.5  RDW 13.2 13.3 13.5  PLT 206 224 251   Thyroid  Recent Labs  Lab 10/24/22 1300  TSH 0.986    BNPNo results for input(s): "BNP", "PROBNP" in the last 168 hours.  DDimer No results for input(s): "DDIMER" in the last 168 hours.   Radiology    MR BRAIN WO CONTRAST  Result Date: 10/29/2022 CLINICAL DATA:  Neuro deficit with acute stroke suspected EXAM: MRI HEAD WITHOUT CONTRAST MRA HEAD WITHOUT CONTRAST TECHNIQUE: Multiplanar, multi-echo pulse sequences of the brain and surrounding structures were acquired without intravenous contrast. Angiographic images of the Circle of Willis were acquired using MRA technique without intravenous contrast. COMPARISON:  Head CT from 5 days ago FINDINGS: MRI HEAD FINDINGS Brain: No acute infarction, hemorrhage, hydrocephalus, extra-axial collection or mass lesion. Generalized atrophy. Fairly extensive chronic small vessel ischemia cerebral white matter with chronic lacune at the right caudate head. Vascular: Normal flow voids. Skull and upper cervical  spine: Normal marrow signal. Sinuses/Orbits: No acute or significant finding. MRA HEAD FINDINGS Anterior circulation: Atheromatous irregularity of the carotid siphons with moderate stenosis at the right M1 segment. Additional moderate narrowing at the left A2 segment. No branch occlusion, beading, or aneurysm. Posterior circulation: Vertebral and basilar arteries are smoothly contoured and widely patent. Mild atheromatous irregularity of the medium size branches. Negative for aneurysm. No vascular malformation. IMPRESSION: Brain MRI: Involuting brain without acute or reversible finding. MRA: 1. No emergent finding. 2. Atherosclerosis with moderate stenoses of the right M1 and left A2 segments. Electronically Signed   By: Tiburcio Pea M.D.   On: 10/29/2022 16:04   MR ANGIO HEAD WO CONTRAST  Result Date: 10/29/2022 CLINICAL DATA:  Neuro deficit with acute stroke suspected EXAM: MRI HEAD WITHOUT CONTRAST MRA HEAD WITHOUT CONTRAST TECHNIQUE: Multiplanar, multi-echo pulse sequences of the brain and surrounding structures were acquired without intravenous contrast. Angiographic images of the Circle of Willis were acquired using MRA technique without intravenous contrast. COMPARISON:  Head CT from 5 days ago FINDINGS: MRI HEAD FINDINGS Brain: No acute infarction, hemorrhage, hydrocephalus, extra-axial collection or mass lesion. Generalized atrophy. Fairly extensive chronic small vessel ischemia cerebral white matter with chronic lacune at the right caudate head. Vascular: Normal flow voids. Skull and upper cervical spine: Normal marrow signal. Sinuses/Orbits: No acute or significant finding. MRA HEAD FINDINGS Anterior circulation: Atheromatous irregularity of the carotid siphons with moderate stenosis at the right M1 segment. Additional moderate narrowing at the left A2 segment. No branch occlusion, beading, or aneurysm. Posterior circulation: Vertebral and basilar arteries are smoothly contoured and widely patent.  Mild atheromatous irregularity of the medium size branches. Negative for aneurysm. No vascular malformation. IMPRESSION: Brain MRI: Involuting brain without acute or reversible finding. MRA: 1. No emergent finding. 2. Atherosclerosis with moderate stenoses of the right M1 and left A2 segments. Electronically Signed   By: Tiburcio Pea M.D.   On: 10/29/2022 16:04    Cardiac Studies     Patient Profile     89 y.o. female with a hx of type 2 DM, chronic A fb on coumadin, sick sinus syndrome status post pacemaker implant, diastolic heart failure, HTN, HLD, remote left breast cancer status post lumpectomy and radiation, essential tremor, who is being seen 10/29/2022 for the evaluation of valvular disease   Assessment & Plan    ?AV vegetation: TTE 10/26/22 read as possible AV vegetation.  I do not see a vegetation but valve is heavily calcified.  Recommend checking blood cultures.  If negative, no further  work-up recommended at this time. -Blood cultures NGTD, will follow  Aortic stenosis. TTE 10/26/22 read as moderate to severe AS.  Appears moderate on my read (V-max 3.1 m/s, mean gradient 21 mmHg, DI 0.32).  Will monitor as outpatient  Mitral valve disease: TTE 10/26/22 read as moderate to severe MR, moderate MS.  On my read appears only mild MR, no MS (mean gradient ).    Chronic Afib: on atenolol for rate control, recommend monitoring on tele to see if adequate rate control.  Has been on coumadin, would consider switching to Eliquis, would be 2.5 mg BID given her age and weight  For questions or updates, please contact [facility] Please consult [email or web address] for contact info under        Signed, Little Ishikawa, MD  10/30/2022, 10:58 AM

## 2022-10-31 ENCOUNTER — Inpatient Hospital Stay (HOSPITAL_COMMUNITY): Payer: Medicare Other

## 2022-10-31 DIAGNOSIS — I350 Nonrheumatic aortic (valve) stenosis: Secondary | ICD-10-CM | POA: Diagnosis not present

## 2022-10-31 DIAGNOSIS — Z950 Presence of cardiac pacemaker: Secondary | ICD-10-CM | POA: Diagnosis not present

## 2022-10-31 DIAGNOSIS — I482 Chronic atrial fibrillation, unspecified: Secondary | ICD-10-CM | POA: Diagnosis not present

## 2022-10-31 DIAGNOSIS — I10 Essential (primary) hypertension: Secondary | ICD-10-CM | POA: Diagnosis not present

## 2022-10-31 DIAGNOSIS — R441 Visual hallucinations: Secondary | ICD-10-CM | POA: Diagnosis not present

## 2022-10-31 DIAGNOSIS — K56609 Unspecified intestinal obstruction, unspecified as to partial versus complete obstruction: Secondary | ICD-10-CM

## 2022-10-31 DIAGNOSIS — R140 Abdominal distension (gaseous): Secondary | ICD-10-CM

## 2022-10-31 DIAGNOSIS — R519 Headache, unspecified: Secondary | ICD-10-CM | POA: Diagnosis not present

## 2022-10-31 DIAGNOSIS — R5381 Other malaise: Secondary | ICD-10-CM | POA: Diagnosis not present

## 2022-10-31 LAB — COMPREHENSIVE METABOLIC PANEL
ALT: 30 U/L (ref 0–44)
AST: 36 U/L (ref 15–41)
Albumin: 2.3 g/dL — ABNORMAL LOW (ref 3.5–5.0)
Alkaline Phosphatase: 54 U/L (ref 38–126)
Anion gap: 7 (ref 5–15)
BUN: 19 mg/dL (ref 8–23)
CO2: 29 mmol/L (ref 22–32)
Calcium: 8.4 mg/dL — ABNORMAL LOW (ref 8.9–10.3)
Chloride: 99 mmol/L (ref 98–111)
Creatinine, Ser: 0.43 mg/dL — ABNORMAL LOW (ref 0.44–1.00)
GFR, Estimated: >60 mL/min (ref >60)
Glucose, Bld: 223 mg/dL — ABNORMAL HIGH (ref 70–99)
Potassium: 4 mmol/L (ref 3.5–5.1)
Sodium: 135 mmol/L (ref 135–145)
Total Bilirubin: 0.9 mg/dL (ref 0.3–1.2)
Total Protein: 4.8 g/dL — ABNORMAL LOW (ref 6.5–8.1)

## 2022-10-31 LAB — CBC WITH DIFFERENTIAL/PLATELET
Abs Immature Granulocytes: 0.03 10*3/uL (ref 0.00–0.07)
Basophils Absolute: 0 10*3/uL (ref 0.0–0.1)
Basophils Relative: 0 %
Eosinophils Absolute: 0.2 10*3/uL (ref 0.0–0.5)
Eosinophils Relative: 2 %
HCT: 31.9 % — ABNORMAL LOW (ref 36.0–46.0)
Hemoglobin: 10.3 g/dL — ABNORMAL LOW (ref 12.0–15.0)
Immature Granulocytes: 0 %
Lymphocytes Relative: 13 %
Lymphs Abs: 1.1 10*3/uL (ref 0.7–4.0)
MCH: 31.4 pg (ref 26.0–34.0)
MCHC: 32.3 g/dL (ref 30.0–36.0)
MCV: 97.3 fL (ref 80.0–100.0)
Monocytes Absolute: 1.2 10*3/uL — ABNORMAL HIGH (ref 0.1–1.0)
Monocytes Relative: 15 %
Neutro Abs: 5.6 10*3/uL (ref 1.7–7.7)
Neutrophils Relative %: 70 %
Platelets: 252 10*3/uL (ref 150–400)
RBC: 3.28 MIL/uL — ABNORMAL LOW (ref 3.87–5.11)
RDW: 13.6 % (ref 11.5–15.5)
WBC: 8.1 10*3/uL (ref 4.0–10.5)
nRBC: 0 % (ref 0.0–0.2)

## 2022-10-31 LAB — PHOSPHORUS: Phosphorus: 3.3 mg/dL (ref 2.5–4.6)

## 2022-10-31 LAB — PROTIME-INR
INR: 2.2 — ABNORMAL HIGH (ref 0.8–1.2)
Prothrombin Time: 24.4 seconds — ABNORMAL HIGH (ref 11.4–15.2)

## 2022-10-31 LAB — CULTURE, BLOOD (ROUTINE X 2): Culture: NO GROWTH

## 2022-10-31 LAB — GLUCOSE, CAPILLARY
Glucose-Capillary: 198 mg/dL — ABNORMAL HIGH (ref 70–99)
Glucose-Capillary: 260 mg/dL — ABNORMAL HIGH (ref 70–99)
Glucose-Capillary: 191 mg/dL — ABNORMAL HIGH (ref 70–99)
Glucose-Capillary: 96 mg/dL (ref 70–99)

## 2022-10-31 LAB — MAGNESIUM: Magnesium: 2 mg/dL (ref 1.7–2.4)

## 2022-10-31 MED ORDER — WARFARIN SODIUM 2 MG PO TABS
2.0000 mg | ORAL_TABLET | Freq: Once | ORAL | Status: DC
  Filled 2022-10-31: qty 1

## 2022-10-31 MED ORDER — SENNOSIDES-DOCUSATE SODIUM 8.6-50 MG PO TABS
1.0000 | ORAL_TABLET | Freq: Two times a day (BID) | ORAL | Status: DC
  Administered 2022-10-31 – 2022-11-02 (×3): 1 via ORAL
  Filled 2022-10-31 (×4): qty 1

## 2022-10-31 NOTE — Progress Notes (Signed)
PROGRESS NOTE    Leah Holmes  WGN:[id number] DOB: Nov 14, 1926 DOA: 10/23/2022 PCP: Sherlene Shams, MD   Brief Narrative:  The patient is a 89 year old with a history of DM2, CAD, chronic atrial fibrillation on warfarin, HTN, HLD, anxiety/depression, remote treatment for breast cancer, and SSS status post pacemaker placement who presented to the [facility] ER 5/10 with a 1 week history of HA and hallucinations. She had been evaluated by an Ophthalmologist in the outpatient setting who found no acute issues. She was transferred to [facility] ER from [facility] to undergo an MRI. CT head at presentation revealed no acute findings. CXR was unrevealing. Head CT x 2 showed no acute findings carotid Dopplers without significant stenosis bilaterally. Subsequently she had mechanical fall in the hospital which resulted in a comminuted displaced intertrochanteric fracture of the right femur and she underwent intramedullary nailing on 10/25/2022. Patient is having trouble with swallowing pills so SLP will be consulted and PT OT recommending SNF.    Patient is having abdominal distention and there is concern for small bowel obstruction so we will obtain a CT scan of the abdomen pelvis with oral and IV contrast and I have notified the general surgery team and they will evaluate the patient tomorrow morning.  Assessment and Plan: Mechanical fall in the hospital with resultant comminuted displaced intertrochanteric fracture of the right femur S/p intramedullary nailing 5/12 -Postoperative care per Orthopedic Surgery  -Warfarin resumed yesterday, with INR notably still within therapeutic range however will hold given ? Valve Vegetation -Continue with bowel regimen -Orthopedic surgery recommending advancing diet up with therapy and also recommending partial weightbearing in the right lower extremity with follow-up in 2 weeks recommending continue to keep the dressings in place unless soiled -PT OT recommending SNF and  has a bed available anticipating discharging on Monday morning after cultures are negative for 48 hours   Persisting HA with visual hallucinations, improved -Has been evaluated by Neurology - EEG unrevealing - CT head x2 w/o acute findings  -Carotid Dopplers without evidence of significant stenosis bilaterally  -No further inpatient w/u is felt to be indicated and MRI was thought to be low yield in setting of non-focal sx w/ 2 negative CT  -Neuro opined this may be the initial onset of Lewy body dementia but after discussion with Neuro again will get MRI formally given ? Valve Vegetation -MRI and MRA done and showed "Brain MRI: Involuting brain without acute or reversible finding. MRA: No emergent finding. Atherosclerosis with moderate stenoses of the right M1 and left A2 segments." -Initiated a trial of low dose seroquel as suggested by Neuro but given her advanced age was begun at a low dose nightly only  -Dose will be held each night if the patient is sedated during the day but if she tolerates it without significant difficulty we could be increased to twice daily dosing  Abdominal Distention with concern for SBO -Examined patient today patient was having abdominal discomfort and was more distended and hypertympanic to percussion.  On auscultation she had hyperactive bowel sounds -KUB was ordered and done and showed "Dilated loops of small bowel present. No definite free air is present the supine view. Moderate stool is present the ascending colon. Right hip ORIF noted." -She is not having any nausea or vomiting so we will not place an NG tube will make her n.p.o. for now -Case was discussed with general surgery Dr. Sophronia Simas who recommends obtaining a CT scan of the abdomen pelvis with oral and  IV contrast and will see the patient in consultation tomorrow morning   Chronic atrial fibrillation on warfarin Rate is well-controlled  -Unclear why patient is not on DOAC instead of warfarin  (despite extensive review of outpatient records); Family checking with Primary Cardiologist for pharmacy review she does not have prescription medication coverage found which is likely the reason because cost may have been a barrier to switching to DOAC in the past: The cardiologist here recommended considering switching to Eliquis 2.5 mg twice daily given her age and weight but for now we will resume the home Coumadin -cleared by Orthopedics to resume warfarin and this was done 10/27/22 (pharmacy to dose) and was held again on the 16th but resumed yesterday and held again today given concern for SBO -Continue monitor PT INR and was 24.4/2.2 respectively   Dysphagia -Obtain SLP evaluation and place on Soft Diet  -SLP evaluated and recommending continuing Dysphagia 3 diet with thin liquids for now but will hold diet given concern for SBO  Hypokalemia -Patient's K+ Level Trend: Recent Labs  Lab 10/25/22 0415 10/26/22 0414 10/27/22 0724 10/28/22 0814 10/29/22 0455 10/30/22 0730 10/31/22 0416  K 3.0* 3.5 3.5 4.5 4.0 3.9 4.0  -Continue to Monitor and Replete as Necessary -Repeat CMP in the AM    Poor Po Intake -Nutritionist Consulted and Initiated Feeding Supplements TID and see below   Hypophosphatemia -Phos Level Trend Recent Labs  Lab 10/28/22 0814 10/29/22 0455 10/30/22 0730 10/31/22 0416  PHOS 2.1* 2.6 2.2* 3.3  -Replete with po K Phos Neutral 500 mg x2 -Continue to Monitor and replete as Necessary -Repeat Phos Level in the AM   Hypomagnesemia -Patient's Mag Level Trend: Recent Labs  Lab 10/26/22 0414 10/27/22 0724 10/28/22 0814 10/29/22 0455 10/30/22 0730 10/31/22 0416  MG 1.5* 2.1 1.9 1.8 1.8 2.0  -Continue to Monitor and Replete as Necessary -Repeat Mag in the AM    HTN -Adjustments made in blood pressure regimen last night -BP at goal presently -avoid overaggressive correction given advanced age -Continue to Monitor BP per Protocol -Last BP reading was  120/60   CAD -Diffuse three-vessel CAD by cardiac cath 2012 with critical mid LAD stenosis not amenable to PCI -has declined follow-up testing as she would not wish to pursue intervention -Nno recent anginal symptoms with exertion   DM2 A1c 6.9 09/07/2022 - CBG reasonably well-controlled presently -monitor trend without change in treatment today -CBG Trend:  Recent Labs  Lab 10/30/22 0621 10/30/22 1202 10/30/22 1712 10/30/22 2044 10/31/22 0627 10/31/22 1102 10/31/22 1638  GLUCAP 194* 323* 103* 214* 198* 260* 191*    SSS status post pacemaker insertion 2017 -Will need MRI Pacer Protocol and Cardiology evaluation given Moderate to Severe Aortic Stenosis -Cardiology evaluated and recommending outpatient monitoring   HLD -Continue usual medical therapy but will hold oral Statin given NPO Status now   Aortic Stenosis -Graded as mild via TTE 2022 -Repeat ECHO this visit done and showed an EF of 60 to 65% with moderate left concentric hypertrophy and moderate to severe aortic stenosis present -Will formally consult cardiology for further evaluation recommendations and they reviewed the echo.  Dr. Bjorn Pippin feels that she will need outpatient monitoring for her aortic stenosis and they are going to monitor as an outpatient   ?  Aortic valve vegetation -TTE done and showed "Findings consistent with hypertrophic cardiomyopathy. Findings concerning for aortic valve vegetation, would recommend a Transesophageal Echocardiogram for clarification." -Consult cardiology formally Dr. Bjorn Pippin reviewed the echo and does  not see a vegetation but feels that the aortic valve is heavily calcified -MRI as above -Currently blood cultures are showing no growth to date -Cardiology recommending checking blood cultures if negative no further workup is recommended; blood cultures have been ordered and cardiology feels if they are no growth at 48 hours then she can be discharged to SNF but now having abdominal  distention and a possible small bowel obstruction   Hypoalbuminemia -Patient's Albumin Trend: Recent Labs  Lab 10/23/22 1339 10/28/22 0814 10/29/22 0455 10/30/22 0730 10/31/22 0416  ALBUMIN 4.0 2.9* 2.9* 2.7* 2.3*  -Continue to Monitor and Trend and repeat CMP in the AM    Severe Protein Calorie Malnutrition Nutrition Status: Nutrition Problem: Severe Malnutrition (in the context of chronic illness) Etiology:  (inadequate energy intake) Signs/Symptoms: severe fat depletion, severe muscle depletion Interventions: Refer to RD note for recommendations  DVT prophylaxis: SCDs Start: 10/25/22 1914 Place TED hose Start: 10/25/22 1914 warfarin (COUMADIN) tablet 2 mg    Code Status: DNR Family Communication: Discussed with Daughter Lynden Ang over the telephone  Disposition Plan:  Level of care: Med-Surg Status is: Inpatient Remains inpatient appropriate because: Has concerns for a SBO and has abdominal distention   Consultants:  Orthopedic Surgery Cardiology Neurology General Surgery   Procedures:  As delineated as above  Antimicrobials:  Anti-infectives (From admission, onward)    Start     Dose/Rate Route Frequency Ordered Stop   10/25/22 2200  ceFAZolin (ANCEF) IVPB 2g/100 mL premix        2 g 200 mL/hr over 30 Minutes Intravenous Every 6 hours 10/25/22 1913 10/26/22 0420   10/25/22 1407  ceFAZolin (ANCEF) 2-4 GM/100ML-% IVPB  Status:  Discontinued       Note to Pharmacy: Kathrene Bongo D: cabinet override      10/25/22 1407 10/25/22 1447   10/25/22 1400  ceFAZolin (ANCEF) IVPB 2g/100 mL premix        2 g 200 mL/hr over 30 Minutes Intravenous On call to O.R. 10/25/22 1336 10/25/22 1616       Subjective: Seen and examined at bedside her mentation was doing much better she is much more awake and alert however she is complaining of some abdominal discomfort.  Had no nausea or vomiting.  Denied chest pain.  States her hip pain is doing okay.  No other concerns or  complaints this time.  Objective: Vitals:   10/30/22 2001 10/31/22 0026 10/31/22 0700 10/31/22 1100  BP: (!) 181/76 (!) 119/56 125/62 120/60  Pulse: 79 83 80 63  Resp: 18 18 18 18   Temp: 98.6 F (37 C)  97.9 F (36.6 C) 98.2 F (36.8 C)  TempSrc: Oral  Oral Axillary  SpO2: 98% 97% 98% 100%  Weight:      Height:        Intake/Output Summary (Last 24 hours) at 10/31/2022 1807 Last data filed at 10/31/2022 1300 Gross per 24 hour  Intake 240 ml  Output 500 ml  Net -260 ml   Filed Weights   10/23/22 2329 10/25/22 1443  Weight: 54.4 kg 54.4 kg   Examination: Physical Exam:  Constitutional: Thin elderly chronically ill-appearing Caucasian female who appears a little uncomfortable complains of abdominal discomfort Respiratory: Diminished to auscultation bilaterally, no wheezing, rales, rhonchi or crackles. Normal respiratory effort and patient is not tachypenic. No accessory muscle use.  Unlabored breathing Cardiovascular: RRR, no murmurs / rubs / gallops. S1 and S2 auscultated. No extremity edema. Abdomen: Soft, little tender to palpate and distended.  Bowel sounds are hyperactive.  Hypertympanic to percuss GU: Deferred. Musculoskeletal: No clubbing / cyanosis of digits/nails. No joint deformity upper and lower extremities.  Skin: No rashes, lesions, ulcers on limited skin evaluation. No induration; Warm and dry.  Neurologic: CN 2-12 grossly intact with no focal deficits.  Romberg sign and cerebellar reflexes not assessed.  Psychiatric: Normal judgment and insight.  She is much more awake and alert today and oriented  Data Reviewed: I have personally reviewed following labs and imaging studies  CBC: Recent Labs  Lab 10/27/22 0724 10/28/22 0814 10/29/22 0455 10/30/22 0730 10/31/22 0416  WBC 10.7* 11.1* 10.5 8.7 8.1  NEUTROABS  --  9.0* 8.3* 6.5 5.6  HGB 11.1* 12.0 11.4* 11.3* 10.3*  HCT 32.4* 34.0* 35.0* 33.7* 31.9*  MCV 93.6 94.4 95.9 95.5 97.3  PLT 190 206 224 251 252    Basic Metabolic Panel: Recent Labs  Lab 10/27/22 0724 10/28/22 0814 10/29/22 0455 10/30/22 0730 10/31/22 0416  NA 133* 135 132* 134* 135  K 3.5 4.5 4.0 3.9 4.0  CL 100 100 97* 97* 99  CO2 27 28 25 28 29   GLUCOSE 205* 207* 244* 234* 223*  BUN 23 23 17 16 19   CREATININE 0.65 0.59 0.51 0.54 0.43*  CALCIUM 8.8* 8.7* 8.5* 8.5* 8.4*  MG 2.1 1.9 1.8 1.8 2.0  PHOS  --  2.1* 2.6 2.2* 3.3   GFR: Estimated Creatinine Clearance: 36.9 mL/min (A) (by C-G formula based on SCr of 0.43 mg/dL (L)). Liver Function Tests: Recent Labs  Lab 10/28/22 0814 10/29/22 0455 10/30/22 0730 10/31/22 0416  AST 26 23 23  36  ALT 16 17 19 30   ALKPHOS 56 52 57 54  BILITOT 1.1 1.0 1.1 0.9  PROT 5.6* 5.6* 5.5* 4.8*  ALBUMIN 2.9* 2.9* 2.7* 2.3*   No results for input(s): "LIPASE", "AMYLASE" in the last 168 hours. No results for input(s): "AMMONIA" in the last 168 hours. Coagulation Profile: Recent Labs  Lab 10/27/22 0820 10/28/22 0814 10/29/22 0455 10/30/22 0730 10/31/22 0416  INR 2.7* 2.3* 2.5* 2.3* 2.2*   Cardiac Enzymes: No results for input(s): "CKTOTAL", "CKMB", "CKMBINDEX", "TROPONINI" in the last 168 hours. BNP (last 3 results) No results for input(s): "PROBNP" in the last 8760 hours. HbA1C: No results for input(s): "HGBA1C" in the last 72 hours. CBG: Recent Labs  Lab 10/30/22 1712 10/30/22 2044 10/31/22 0627 10/31/22 1102 10/31/22 1638  GLUCAP 103* 214* 198* 260* 191*   Lipid Profile: Recent Labs    10/29/22 0455  CHOL 133  HDL 52  LDLCALC 69  TRIG 59  CHOLHDL 2.6   Thyroid Function Tests: No results for input(s): "TSH", "T4TOTAL", "FREET4", "T3FREE", "THYROIDAB" in the last 72 hours. Anemia Panel: No results for input(s): "VITAMINB12", "FOLATE", "FERRITIN", "TIBC", "IRON", "RETICCTPCT" in the last 72 hours. Sepsis Labs: No results for input(s): "PROCALCITON", "LATICACIDVEN" in the last 168 hours.  Recent Results (from the past 240 hour(s))  Surgical pcr screen      Status: None   Collection Time: 10/25/22  1:54 PM   Specimen: Nasal Mucosa; Nasal Swab  Result Value Ref Range Status   MRSA, PCR NEGATIVE NEGATIVE Final   Staphylococcus aureus NEGATIVE NEGATIVE Final    Comment: (NOTE) The Xpert SA Assay (FDA approved for NASAL specimens in patients 60 years of age and older), is one component of a comprehensive surveillance program. It is not intended to diagnose infection nor to guide or monitor treatment. Performed at [facility], [address],  [place], [place] [address]   Culture, blood (Routine X 2) w Reflex to ID Panel     Status: None (Preliminary result)   Collection Time: 10/29/22  6:54 PM   Specimen: BLOOD  Result Value Ref Range Status   Specimen Description BLOOD SITE NOT SPECIFIED  Final   Special Requests   Final    BOTTLES DRAWN AEROBIC AND ANAEROBIC Blood Culture results may not be optimal due to an excessive volume of blood received in culture bottles   Culture   Final    NO GROWTH 2 DAYS Performed at [facility], [address] [place], [place] [address]    Report Status PENDING  Incomplete  Culture, blood (Routine X 2) w Reflex to ID Panel     Status: None (Preliminary result)   Collection Time: 10/29/22  6:58 PM   Specimen: BLOOD RIGHT ARM  Result Value Ref Range Status   Specimen Description BLOOD RIGHT ARM  Final   Special Requests   Final    BOTTLES DRAWN AEROBIC AND ANAEROBIC Blood Culture results may not be optimal due to an excessive volume of blood received in culture bottles   Culture   Final    NO GROWTH 2 DAYS Performed at [facility], [address] [place], [place] [address]    Report Status PENDING  Incomplete    Radiology Studies: DG Abd 1 View  Result Date: 10/31/2022 CLINICAL DATA:  Abdominal distension.  Abdominal discomfort. EXAM: ABDOMEN - 1 VIEW COMPARISON:  Pelvis 10/25/2022. FINDINGS: Dilated loops of small bowel present. No definite free air is present the supine view.  Moderate stool is present the ascending colon. Right hip ORIF noted. IMPRESSION: Dilated loops of small bowel compatible with small bowel obstruction. Electronically Signed   By: Marin Roberts M.D.   On: 10/31/2022 14:27    Scheduled Meds:  atenolol  25 mg Oral BID   feeding supplement  237 mL Oral TID BM   insulin aspart  0-9 Units Subcutaneous TID WC   isosorbide mononitrate  60 mg Oral BID   lisinopril  40 mg Oral BID   multivitamin with minerals  1 tablet Oral Daily   neomycin-polymyxin b-dexamethasone  1 drop Both Eyes BID   polyethylene glycol  17 g Oral BID   QUEtiapine  12.5 mg Oral QHS   senna-docusate  1 tablet Oral BID   warfarin  2 mg Oral ONCE-1600   Warfarin - Pharmacist Dosing Inpatient   Does not apply q1600   Continuous Infusions:  methocarbamol (ROBAXIN) IV      LOS: 6 days   Marguerita Merles, DO [facility] Available via Epic secure chat 7am-7pm After these hours, please refer to coverage provider listed on [email or web address] 10/31/2022, 6:07 PM

## 2022-10-31 NOTE — Progress Notes (Signed)
Rounding Note    Patient Name: Leah Holmes Date of Encounter: 10/31/2022  [facility] Cardiologist: None   Subjective   Denies any dyspnea  Inpatient Medications    Scheduled Meds:  atenolol  25 mg Oral BID   docusate  100 mg Oral BID   feeding supplement  237 mL Oral TID BM   insulin aspart  0-9 Units Subcutaneous TID WC   isosorbide mononitrate  60 mg Oral BID   lisinopril  40 mg Oral BID   multivitamin with minerals  1 tablet Oral Daily   neomycin-polymyxin b-dexamethasone  1 drop Both Eyes BID   polyethylene glycol  17 g Oral BID   QUEtiapine  12.5 mg Oral QHS   warfarin  2 mg Oral ONCE-1600   Warfarin - Pharmacist Dosing Inpatient   Does not apply q1600   Continuous Infusions:  methocarbamol (ROBAXIN) IV     PRN Meds: acetaminophen, bisacodyl, HYDROmorphone (DILAUDID) injection, labetalol, meclizine, menthol-cetylpyridinium **OR** phenol, methocarbamol **OR** methocarbamol (ROBAXIN) IV, oxyCODONE, polyvinyl alcohol   Vital Signs    Vitals:   10/30/22 1740 10/30/22 2001 10/31/22 0026 10/31/22 0700  BP: (!) 160/52 (!) 181/76 (!) 119/56 125/62  Pulse: 77 79 83 80  Resp: 20 18 18 18   Temp: 98 F (36.7 C) 98.6 F (37 C)  97.9 F (36.6 C)  TempSrc: Oral Oral  Oral  SpO2: 98% 98% 97% 98%  Weight:      Height:        Intake/Output Summary (Last 24 hours) at 10/31/2022 0902 Last data filed at 10/31/2022 0800 Gross per 24 hour  Intake 957 ml  Output 1300 ml  Net -343 ml       10/25/2022    2:43 PM 10/23/2022   11:29 PM 10/23/2022    1:30 PM  Last 3 Weights  Weight (lbs) 119 lb 14.9 oz 119 lb 14.9 oz 120 lb  Weight (kg) 54.4 kg 54.4 kg 54.432 kg      Telemetry    Not on telemetry - Personally Reviewed  ECG    NO new ECG - Personally Reviewed  Physical Exam   GEN: No acute distress.   Neck: No JVD Cardiac: irregular, normal rate, 3/6 systolic murmur Respiratory: Clear to auscultation bilaterally. GI: Soft, nontender MS:  No edema; No deformity. Neuro:  Nonfocal  Psych: Normal affect   Labs    High Sensitivity Troponin:  No results for input(s): "TROPONINIHS" in the last 720 hours.   Chemistry Recent Labs  Lab 10/29/22 0455 10/30/22 0730 10/31/22 0416  NA 132* 134* 135  K 4.0 3.9 4.0  CL 97* 97* 99  CO2 25 28 29   GLUCOSE 244* 234* 223*  BUN 17 16 19   CREATININE 0.51 0.54 0.43*  CALCIUM 8.5* 8.5* 8.4*  MG 1.8 1.8 2.0  PROT 5.6* 5.5* 4.8*  ALBUMIN 2.9* 2.7* 2.3*  AST 23 23 36  ALT 17 19 30   ALKPHOS 52 57 54  BILITOT 1.0 1.1 0.9  GFRNONAA >60 >60 >60  ANIONGAP 10 9 7      Lipids  Recent Labs  Lab 10/29/22 0455  CHOL 133  TRIG 59  HDL 52  LDLCALC 69  CHOLHDL 2.6     Hematology Recent Labs  Lab 10/29/22 0455 10/30/22 0730 10/31/22 0416  WBC 10.5 8.7 8.1  RBC 3.65* 3.53* 3.28*  HGB 11.4* 11.3* 10.3*  HCT 35.0* 33.7* 31.9*  MCV 95.9 95.5 97.3  MCH 31.2 32.0 31.4  MCHC 32.6  33.5 32.3  RDW 13.3 13.5 13.6  PLT 224 251 252    Thyroid  Recent Labs  Lab 10/24/22 1300  TSH 0.986     BNPNo results for input(s): "BNP", "PROBNP" in the last 168 hours.  DDimer No results for input(s): "DDIMER" in the last 168 hours.   Radiology    MR BRAIN WO CONTRAST  Result Date: 10/29/2022 CLINICAL DATA:  Neuro deficit with acute stroke suspected EXAM: MRI HEAD WITHOUT CONTRAST MRA HEAD WITHOUT CONTRAST TECHNIQUE: Multiplanar, multi-echo pulse sequences of the brain and surrounding structures were acquired without intravenous contrast. Angiographic images of the Circle of Willis were acquired using MRA technique without intravenous contrast. COMPARISON:  Head CT from 5 days ago FINDINGS: MRI HEAD FINDINGS Brain: No acute infarction, hemorrhage, hydrocephalus, extra-axial collection or mass lesion. Generalized atrophy. Fairly extensive chronic small vessel ischemia cerebral white matter with chronic lacune at the right caudate head. Vascular: Normal flow voids. Skull and upper cervical spine:  Normal marrow signal. Sinuses/Orbits: No acute or significant finding. MRA HEAD FINDINGS Anterior circulation: Atheromatous irregularity of the carotid siphons with moderate stenosis at the right M1 segment. Additional moderate narrowing at the left A2 segment. No branch occlusion, beading, or aneurysm. Posterior circulation: Vertebral and basilar arteries are smoothly contoured and widely patent. Mild atheromatous irregularity of the medium size branches. Negative for aneurysm. No vascular malformation. IMPRESSION: Brain MRI: Involuting brain without acute or reversible finding. MRA: 1. No emergent finding. 2. Atherosclerosis with moderate stenoses of the right M1 and left A2 segments. Electronically Signed   By: Tiburcio Pea M.D.   On: 10/29/2022 16:04   MR ANGIO HEAD WO CONTRAST  Result Date: 10/29/2022 CLINICAL DATA:  Neuro deficit with acute stroke suspected EXAM: MRI HEAD WITHOUT CONTRAST MRA HEAD WITHOUT CONTRAST TECHNIQUE: Multiplanar, multi-echo pulse sequences of the brain and surrounding structures were acquired without intravenous contrast. Angiographic images of the Circle of Willis were acquired using MRA technique without intravenous contrast. COMPARISON:  Head CT from 5 days ago FINDINGS: MRI HEAD FINDINGS Brain: No acute infarction, hemorrhage, hydrocephalus, extra-axial collection or mass lesion. Generalized atrophy. Fairly extensive chronic small vessel ischemia cerebral white matter with chronic lacune at the right caudate head. Vascular: Normal flow voids. Skull and upper cervical spine: Normal marrow signal. Sinuses/Orbits: No acute or significant finding. MRA HEAD FINDINGS Anterior circulation: Atheromatous irregularity of the carotid siphons with moderate stenosis at the right M1 segment. Additional moderate narrowing at the left A2 segment. No branch occlusion, beading, or aneurysm. Posterior circulation: Vertebral and basilar arteries are smoothly contoured and widely patent. Mild  atheromatous irregularity of the medium size branches. Negative for aneurysm. No vascular malformation. IMPRESSION: Brain MRI: Involuting brain without acute or reversible finding. MRA: 1. No emergent finding. 2. Atherosclerosis with moderate stenoses of the right M1 and left A2 segments. Electronically Signed   By: Tiburcio Pea M.D.   On: 10/29/2022 16:04    Cardiac Studies     Patient Profile     89 y.o. female with a hx of type 2 DM, chronic A fb on coumadin, sick sinus syndrome status post pacemaker implant, diastolic heart failure, HTN, HLD, remote left breast cancer status post lumpectomy and radiation, essential tremor, who is being seen 10/29/2022 for the evaluation of valvular disease   Assessment & Plan    ?AV vegetation: TTE 10/26/22 read as possible AV vegetation.  I do not see a vegetation but valve is heavily calcified.  Recommend checking blood cultures.  If  negative, no further work-up recommended at this time. -Blood cultures NGTD, will follow  Aortic stenosis. TTE 10/26/22 read as moderate to severe AS.  Appears moderate on my read (V-max 3.1 m/s, mean gradient 21 mmHg, DI 0.32).  Will monitor as outpatient  Mitral valve disease: TTE 10/26/22 read as moderate to severe MR, moderate MS.  On my read appears only mild MR, no MS (mean gradient ).    Chronic Afib: on atenolol for rate control, recommend monitoring on tele to see if adequate rate control.  Has been on coumadin, would consider switching to Eliquis, would be 2.5 mg BID given her age and weight  SSS: s/p PPM in 2017  Femur fracture: s.p mechanical fall in hospital.  Underwent surgery 5/12.  For questions or updates, please contact [facility] Please consult [email or web address] for contact info under        Signed, Little Ishikawa, MD  10/31/2022, 9:02 AM

## 2022-10-31 NOTE — Progress Notes (Signed)
ANTICOAGULATION CONSULT NOTE  Pharmacy Consult for Warfarin Indication: atrial fibrillation  Allergies  Allergen Reactions   Codeine Anaphylaxis   Penicillins Anaphylaxis   Clonidine Derivatives Other (See Comments)    Unknown reaction   Cozaar [Losartan] Other (See Comments)    Myalgias    Lipitor [Atorvastatin] Other (See Comments)    Unknown reaction   Ms Contin [Morphine] Other (See Comments)    Unknown reaction   Neurontin [Gabapentin] Nausea Only   Reglan [Metoclopramide] Other (See Comments)    Tremors    Valtrex [Valacyclovir] Other (See Comments)    Tremors    Aldactone [Spironolactone] Itching   Apresoline [Hydralazine] Anxiety   Norvasc [Amlodipine] Itching    Does cause itching. Pt does take PRN if needed.    Patient Measurements: Height: 5' 5.98" (167.6 cm) Weight: 54.4 kg (119 lb 14.9 oz) IBW/kg (Calculated) : 59.26  Vital Signs: Temp: 97.9 F (36.6 C) (05/18 0700) Temp Source: Oral (05/18 0700) BP: 125/62 (05/18 0700) Pulse Rate: 80 (05/18 0700)  Labs: Recent Labs    10/29/22 0455 10/30/22 0730 10/31/22 0416  HGB 11.4* 11.3* 10.3*  HCT 35.0* 33.7* 31.9*  PLT 224 251 252  LABPROT 27.0* 25.3* 24.4*  INR 2.5* 2.3* 2.2*  CREATININE 0.51 0.54 0.43*     Estimated Creatinine Clearance: 36.9 mL/min (A) (by C-G formula based on SCr of 0.43 mg/dL (L)).   Medical History: Past Medical History:  Diagnosis Date   Atrial fibrillation (HCC) 2012   Breast cancer (HCC) 04/2018   IDC of left breast    CHF (congestive heart failure) (HCC)    Colon polyp 2003   Compression fracture of L2 lumbar vertebra (HCC)    Coronary artery disease    COVID-19 virus infection 12/25/2019   Diabetes mellitus without complication (HCC)    Dyspnea    Dysrhythmia    atrial fib   Environmental and seasonal allergies    GERD (gastroesophageal reflux disease)    Hyperlipidemia    Hypertension    Hypertensive urgency 03/10/2021   Irritable bowel syndrome    Multiple  rib fractures 03/15/2018   Personal history of radiation therapy    Polyp of colon    PONV (postoperative nausea and vomiting)    with ether, not recently "woke up in surgery once"   Presence of permanent cardiac pacemaker    Tremor    Tremors of nervous system    Ulcer     Medications:  Medications Prior to Admission  Medication Sig Dispense Refill Last Dose   acetaminophen (TYLENOL) 500 MG tablet Take 1,000 mg by mouth every 6 (six) hours as needed for moderate pain, fever or headache.   10/23/2022   amLODipine (NORVASC) 10 MG tablet Take 1 tablet (10 mg total) by mouth daily. (Patient taking differently: Take 10 mg by mouth daily as needed (sBP > 140 after all medications have been taken).) 30 tablet 0 Past Week   atenolol (TENORMIN) 25 MG tablet Take 25 mg by mouth 2 (two) times daily.   10/23/2022 at 0845   hydrochlorothiazide (HYDRODIURIL) 25 MG tablet Take 25 mg by mouth every other day.   Past Week   isosorbide mononitrate (IMDUR) 60 MG 24 hr tablet Take 60 mg by mouth 2 (two) times daily.   10/23/2022   lisinopril (ZESTRIL) 40 MG tablet Take 1 tablet (40 mg total) by mouth 2 (two) times daily. 180 tablet 0 10/23/2022   meclizine (ANTIVERT) 25 MG tablet Take 1 tablet by mouth three  times daily as needed (Patient taking differently: Take 25 mg by mouth 3 (three) times daily as needed for dizziness.) 40 tablet 5 Past Month   neomycin-polymyxin b-dexamethasone (MAXITROL) 3.5-10000-0.1 SUSP Place 1 drop into both eyes 2 (two) times daily. 10 day course.   10/23/2022   nitroGLYCERIN (NITROSTAT) 0.4 MG SL tablet Place 0.4 mg under the tongue every 5 (five) minutes as needed for chest pain.   UNK   Polyethyl Glycol-Propyl Glycol (SYSTANE OP) Place 1 drop into both eyes 4 (four) times daily as needed (dry eyes).   10/23/2022   warfarin (COUMADIN) 2 MG tablet Take 2-4 mg by mouth See admin instructions. 2 mg every Sunday and Wednesday night at 2000. Take 4 mg at 2000 on all other days.   10/22/2022 at  2000   glucose blood (ONE TOUCH ULTRA TEST) test strip USE 1 STRIP TO CHECK GLUCOSE TWICE DAILY  Dx code: E11.9 200 each 3    glucose blood (ONE TOUCH ULTRA TEST) test strip USE ONE STRIP TO CHECK GLUCOSE ONCE  DAILY 100 each 3    glucose blood test strip Use to check blood sugars once daily. 100 each 12    Lancets (ONETOUCH DELICA PLUS LANCET33G) MISC Place 2 Devices inside cheek daily. DX Code E11.9. 100 each 2     Assessment: 89 yo F on warfarin PTA for afib.  PTA dose is warfarin 4mg  daily except 2mg  on Sun and Weds.  Warfarin was held for 48 hours around the timing of ortho surgery and again for MRI to r/o CVA.  Per Dr. Marland Mcalpine, ok to resume anticoagulation.  Asked pharmacy team to check copay for DOAC for possible medication change.  No prescription medication coverage found.  Perhaps cost has been a barrier to switching to DOAC in the past.  5/18: INR 2.2, slight drop in Hgb 11.3>>10.3. No signs of bleeding reported by RN.    Goal of Therapy:  INR 2-3 Monitor platelets by anticoagulation protocol: Yes   Plan:  Warfarin 2 mg PO x 1 per PTA regimen  Daily INR  Jani Gravel, PharmD PGY-2 Infectious Diseases Resident  10/31/2022 8:05 AM

## 2022-11-01 ENCOUNTER — Inpatient Hospital Stay (HOSPITAL_COMMUNITY): Payer: Medicare Other

## 2022-11-01 DIAGNOSIS — K567 Ileus, unspecified: Secondary | ICD-10-CM

## 2022-11-01 DIAGNOSIS — I350 Nonrheumatic aortic (valve) stenosis: Secondary | ICD-10-CM | POA: Diagnosis not present

## 2022-11-01 DIAGNOSIS — I10 Essential (primary) hypertension: Secondary | ICD-10-CM | POA: Diagnosis not present

## 2022-11-01 DIAGNOSIS — R5381 Other malaise: Secondary | ICD-10-CM | POA: Diagnosis not present

## 2022-11-01 DIAGNOSIS — R441 Visual hallucinations: Secondary | ICD-10-CM | POA: Diagnosis not present

## 2022-11-01 DIAGNOSIS — R519 Headache, unspecified: Secondary | ICD-10-CM | POA: Diagnosis not present

## 2022-11-01 DIAGNOSIS — I482 Chronic atrial fibrillation, unspecified: Secondary | ICD-10-CM | POA: Diagnosis not present

## 2022-11-01 LAB — CBC WITH DIFFERENTIAL/PLATELET
Abs Immature Granulocytes: 0.05 10*3/uL (ref 0.00–0.07)
Basophils Absolute: 0 10*3/uL (ref 0.0–0.1)
Basophils Relative: 0 %
Eosinophils Absolute: 0.1 10*3/uL (ref 0.0–0.5)
Eosinophils Relative: 1 %
HCT: 35.3 % — ABNORMAL LOW (ref 36.0–46.0)
Hemoglobin: 11.6 g/dL — ABNORMAL LOW (ref 12.0–15.0)
Immature Granulocytes: 1 %
Lymphocytes Relative: 11 %
Lymphs Abs: 0.9 10*3/uL (ref 0.7–4.0)
MCH: 32 pg (ref 26.0–34.0)
MCHC: 32.9 g/dL (ref 30.0–36.0)
MCV: 97.2 fL (ref 80.0–100.0)
Monocytes Absolute: 1 10*3/uL (ref 0.1–1.0)
Monocytes Relative: 12 %
Neutro Abs: 6.7 10*3/uL (ref 1.7–7.7)
Neutrophils Relative %: 75 %
Platelets: 300 10*3/uL (ref 150–400)
RBC: 3.63 MIL/uL — ABNORMAL LOW (ref 3.87–5.11)
RDW: 13.9 % (ref 11.5–15.5)
WBC: 8.9 10*3/uL (ref 4.0–10.5)
nRBC: 0 % (ref 0.0–0.2)

## 2022-11-01 LAB — COMPREHENSIVE METABOLIC PANEL
ALT: 36 U/L (ref 0–44)
AST: 40 U/L (ref 15–41)
Albumin: 2.7 g/dL — ABNORMAL LOW (ref 3.5–5.0)
Alkaline Phosphatase: 63 U/L (ref 38–126)
Anion gap: 10 (ref 5–15)
BUN: 18 mg/dL (ref 8–23)
CO2: 29 mmol/L (ref 22–32)
Calcium: 8.9 mg/dL (ref 8.9–10.3)
Chloride: 98 mmol/L (ref 98–111)
Creatinine, Ser: 0.55 mg/dL (ref 0.44–1.00)
GFR, Estimated: >60 mL/min (ref >60)
Glucose, Bld: 211 mg/dL — ABNORMAL HIGH (ref 70–99)
Potassium: 4.4 mmol/L (ref 3.5–5.1)
Sodium: 137 mmol/L (ref 135–145)
Total Bilirubin: 1.5 mg/dL — ABNORMAL HIGH (ref 0.3–1.2)
Total Protein: 5.7 g/dL — ABNORMAL LOW (ref 6.5–8.1)

## 2022-11-01 LAB — GLUCOSE, CAPILLARY
Glucose-Capillary: 209 mg/dL — ABNORMAL HIGH (ref 70–99)
Glucose-Capillary: 197 mg/dL — ABNORMAL HIGH (ref 70–99)
Glucose-Capillary: 134 mg/dL — ABNORMAL HIGH (ref 70–99)
Glucose-Capillary: 126 mg/dL — ABNORMAL HIGH (ref 70–99)

## 2022-11-01 LAB — PROTIME-INR
INR: 1.8 — ABNORMAL HIGH (ref 0.8–1.2)
Prothrombin Time: 21.5 seconds — ABNORMAL HIGH (ref 11.4–15.2)

## 2022-11-01 LAB — MAGNESIUM: Magnesium: 1.9 mg/dL (ref 1.7–2.4)

## 2022-11-01 LAB — CULTURE, BLOOD (ROUTINE X 2)

## 2022-11-01 LAB — PHOSPHORUS: Phosphorus: 2.7 mg/dL (ref 2.5–4.6)

## 2022-11-01 MED ORDER — ALUM & MAG HYDROXIDE-SIMETH 200-200-20 MG/5ML PO SUSP
30.0000 mL | ORAL | Status: DC | PRN
  Administered 2022-11-01: 30 mL via ORAL
  Filled 2022-11-01: qty 30

## 2022-11-01 MED ORDER — WARFARIN SODIUM 4 MG PO TABS
4.0000 mg | ORAL_TABLET | Freq: Once | ORAL | Status: AC
  Administered 2022-11-01: 4 mg via ORAL
  Filled 2022-11-01: qty 1

## 2022-11-01 MED ORDER — IOHEXOL 350 MG/ML SOLN
75.0000 mL | Freq: Once | INTRAVENOUS | Status: AC | PRN
  Administered 2022-11-01: 75 mL via INTRAVENOUS

## 2022-11-01 MED ORDER — SORBITOL 70 % SOLN
200.0000 mL | TOPICAL_OIL | Freq: Once | ORAL | Status: AC
  Administered 2022-11-01: 200 mL via RECTAL
  Filled 2022-11-01: qty 60

## 2022-11-01 NOTE — Significant Event (Signed)
Smog enema given without issues; received back copious loose brown stools + flatus.  Bath given. Abdominal distention have significant gone down. Patient resting in bed now.

## 2022-11-01 NOTE — Significant Event (Signed)
Patient finished 1st bottle of contrast. To continue with 2nd bottle of contrast at 10:15am per instructions from CT staff.

## 2022-11-01 NOTE — Significant Event (Signed)
RN called to CT to see when patient can come down as it has not been completed yet. Was told by tech that she will be sending contrast via tube station for patient to take prior to the scan.

## 2022-11-01 NOTE — Progress Notes (Signed)
ANTICOAGULATION CONSULT NOTE  Pharmacy Consult for Warfarin Indication: atrial fibrillation  Allergies  Allergen Reactions   Codeine Anaphylaxis   Penicillins Anaphylaxis   Clonidine Derivatives Other (See Comments)    Unknown reaction   Cozaar [Losartan] Other (See Comments)    Myalgias    Lipitor [Atorvastatin] Other (See Comments)    Unknown reaction   Ms Contin [Morphine] Other (See Comments)    Unknown reaction   Neurontin [Gabapentin] Nausea Only   Reglan [Metoclopramide] Other (See Comments)    Tremors    Valtrex [Valacyclovir] Other (See Comments)    Tremors    Aldactone [Spironolactone] Itching   Apresoline [Hydralazine] Anxiety   Norvasc [Amlodipine] Itching    Does cause itching. Pt does take PRN if needed.    Patient Measurements: Height: 5' 5.98" (167.6 cm) Weight: 54.4 kg (119 lb 14.9 oz) IBW/kg (Calculated) : 59.26  Vital Signs: Temp: 98.6 F (37 C) (05/19 0434) Temp Source: Oral (05/19 0434) BP: 168/62 (05/19 0434) Pulse Rate: 89 (05/19 0434)  Labs: Recent Labs    10/30/22 0730 10/31/22 0416 11/01/22 0411  HGB 11.3* 10.3* 11.6*  HCT 33.7* 31.9* 35.3*  PLT 251 252 300  LABPROT 25.3* 24.4* 21.5*  INR 2.3* 2.2* 1.8*  CREATININE 0.54 0.43* 0.55     Estimated Creatinine Clearance: 36.9 mL/min (by C-G formula based on SCr of 0.55 mg/dL).   Medical History: Past Medical History:  Diagnosis Date   Atrial fibrillation (HCC) 2012   Breast cancer (HCC) 04/2018   IDC of left breast    CHF (congestive heart failure) (HCC)    Colon polyp 2003   Compression fracture of L2 lumbar vertebra (HCC)    Coronary artery disease    COVID-19 virus infection 12/25/2019   Diabetes mellitus without complication (HCC)    Dyspnea    Dysrhythmia    atrial fib   Environmental and seasonal allergies    GERD (gastroesophageal reflux disease)    Hyperlipidemia    Hypertension    Hypertensive urgency 03/10/2021   Irritable bowel syndrome    Multiple rib  fractures 03/15/2018   Personal history of radiation therapy    Polyp of colon    PONV (postoperative nausea and vomiting)    with ether, not recently "woke up in surgery once"   Presence of permanent cardiac pacemaker    Tremor    Tremors of nervous system    Ulcer     Medications:  Medications Prior to Admission  Medication Sig Dispense Refill Last Dose   acetaminophen (TYLENOL) 500 MG tablet Take 1,000 mg by mouth every 6 (six) hours as needed for moderate pain, fever or headache.   10/23/2022   amLODipine (NORVASC) 10 MG tablet Take 1 tablet (10 mg total) by mouth daily. (Patient taking differently: Take 10 mg by mouth daily as needed (sBP > 140 after all medications have been taken).) 30 tablet 0 Past Week   atenolol (TENORMIN) 25 MG tablet Take 25 mg by mouth 2 (two) times daily.   10/23/2022 at 0845   hydrochlorothiazide (HYDRODIURIL) 25 MG tablet Take 25 mg by mouth every other day.   Past Week   isosorbide mononitrate (IMDUR) 60 MG 24 hr tablet Take 60 mg by mouth 2 (two) times daily.   10/23/2022   lisinopril (ZESTRIL) 40 MG tablet Take 1 tablet (40 mg total) by mouth 2 (two) times daily. 180 tablet 0 10/23/2022   meclizine (ANTIVERT) 25 MG tablet Take 1 tablet by mouth three times daily  as needed (Patient taking differently: Take 25 mg by mouth 3 (three) times daily as needed for dizziness.) 40 tablet 5 Past Month   neomycin-polymyxin b-dexamethasone (MAXITROL) 3.5-10000-0.1 SUSP Place 1 drop into both eyes 2 (two) times daily. 10 day course.   10/23/2022   nitroGLYCERIN (NITROSTAT) 0.4 MG SL tablet Place 0.4 mg under the tongue every 5 (five) minutes as needed for chest pain.   UNK   Polyethyl Glycol-Propyl Glycol (SYSTANE OP) Place 1 drop into both eyes 4 (four) times daily as needed (dry eyes).   10/23/2022   warfarin (COUMADIN) 2 MG tablet Take 2-4 mg by mouth See admin instructions. 2 mg every Sunday and Wednesday night at 2000. Take 4 mg at 2000 on all other days.   10/22/2022 at  2000   glucose blood (ONE TOUCH ULTRA TEST) test strip USE 1 STRIP TO CHECK GLUCOSE TWICE DAILY  Dx code: E11.9 200 each 3    glucose blood (ONE TOUCH ULTRA TEST) test strip USE ONE STRIP TO CHECK GLUCOSE ONCE  DAILY 100 each 3    glucose blood test strip Use to check blood sugars once daily. 100 each 12    Lancets (ONETOUCH DELICA PLUS LANCET33G) MISC Place 2 Devices inside cheek daily. DX Code E11.9. 100 each 2     Assessment: 89 yo F on warfarin PTA for afib.  PTA dose is warfarin 4mg  daily except 2mg  on Sun and Weds.  Warfarin was held for 48 hours around the timing of ortho surgery and again for MRI to r/o CVA.  Per Dr. Marland Mcalpine, ok to resume anticoagulation.  Asked pharmacy team to check copay for DOAC for possible medication change.  No prescription medication coverage found.  Perhaps cost has been a barrier to switching to DOAC in the past.  5/18: INR now subtherapeutic  at 1.8. Warfarin dose held 5/18 PM due to concern for SBO. CCS recommending bowel regimen and clears. Ok to give patient warfarin per primary MD. CBC OK/stable.   Goal of Therapy:  INR 2-3 Monitor platelets by anticoagulation protocol: Yes   Plan:  Warfarin 4 mg PO x 1 per PTA regimen  Daily INR  Jani Gravel, PharmD PGY-2 Infectious Diseases Resident  11/01/2022 7:48 AM

## 2022-11-01 NOTE — Consult Note (Signed)
Leah Holmes 17-Jun-1926  [id number].    Requesting MD: Dr. Marguerita Merles Chief Complaint/Reason for Consult: small bowel obstruction vs ileus  HPI:  Leah Holmes is a 89 yo female with multiple medical comorbidities who was admitted with head and hallucinations. During her admission she had a fall resulting in a right femur fracture, for which she underwent IMN on 5/12. Today she has had increased abdominal distension and some abdominal pain, but no nausea or vomiting. She had a KUB showing dilated small bowel loops concerning for a small bowel obstruction. General surgery was consulted. On my exam the patient denies nausea and says she has been passing flatus.  She has had multiple prior abdominal surgeries, including two sections, hysterectomy, and an additional surgery for a tumor although she is not sure what type of tumor. She says her most recent abdominal surgery was many years ago.  ROS: Review of Systems  Constitutional:  Negative for chills and fever.  Gastrointestinal:  Positive for abdominal pain and constipation. Negative for nausea and vomiting.  Musculoskeletal:  Positive for falls.    Family History  Problem Relation Age of Onset   Breast cancer Sister    Cancer Sister 26       breast   Breast cancer Maternal Aunt 6   Breast cancer Cousin    Breast cancer Other 95    Past Medical History:  Diagnosis Date   Atrial fibrillation (HCC) 2012   Breast cancer (HCC) 04/2018   IDC of left breast    CHF (congestive heart failure) (HCC)    Colon polyp 2003   Compression fracture of L2 lumbar vertebra (HCC)    Coronary artery disease    COVID-19 virus infection 12/25/2019   Diabetes mellitus without complication (HCC)    Dyspnea    Dysrhythmia    atrial fib   Environmental and seasonal allergies    GERD (gastroesophageal reflux disease)    Hyperlipidemia    Hypertension    Hypertensive urgency 03/10/2021   Irritable bowel syndrome    Multiple rib  fractures 03/15/2018   Personal history of radiation therapy    Polyp of colon    PONV (postoperative nausea and vomiting)    with ether, not recently "woke up in surgery once"   Presence of permanent cardiac pacemaker    Tremor    Tremors of nervous system    Ulcer     Past Surgical History:  Procedure Laterality Date   ABDOMINAL ADHESION SURGERY     ABDOMINAL HYSTERECTOMY     APPENDECTOMY     BREAST BIOPSY Right late 80s/early 90s   benign   BREAST BIOPSY Left 03/31/2018   [place] bx done with Dr. Lemar Livings, invasive ductal carcinoma   BREAST LUMPECTOMY Left 04/22/2018   Procedure: BREAST LUMPECTOMY;  Surgeon: Earline Mayotte, MD;  Location: [facility] ORS;  Service: General;  Laterality: Left;   BREAST LUMPECTOMY Left 04/22/2018   IM[facility], negative LN, clear margins   BREAST LUMPECTOMY Left 2020   positive, done in office   BREAST SURGERY Right    biopsy    CESAREAN SECTION     x 2   CHOLECYSTECTOMY  03/2012   Dr Excell Seltzer   COLONOSCOPY  2003   Dr Fritzi Mandes in [place]   fibroid tumor     INTRAMEDULLARY (IM) NAIL INTERTROCHANTERIC Right 10/25/2022   Procedure: INTRAMEDULLARY (IM) NAIL INTERTROCHANTERIC;  Surgeon: Durene Romans, MD;  Location: [facility];  Service: Orthopedics;  Laterality: Right;   PACEMAKER  INSERTION N/A 01/28/2016   Procedure: INSERTION PACEMAKER;  Surgeon: Marcina Millard, MD;  Location: [facility] ORS;  Service: Cardiovascular;  Laterality: N/A;   SENTINEL NODE BIOPSY Left 04/22/2018   Procedure: SENTINEL NODE BIOPSY;  Surgeon: Earline Mayotte, MD;  Location: [facility] OR[facility];  Service: General;  Laterality: Left;   UPPER GI ENDOSCOPY  2003   Dr Fritzi Mandes in [place]    Social History:  reports that she has never smoked. She has quit using smokeless tobacco.  Her smokeless tobacco use included snuff. She reports that she does not drink alcohol and does not use drugs.  Allergies:  Allergies  Allergen Reactions   Codeine Anaphylaxis   Penicillins Anaphylaxis   Clonidine  Derivatives Other (See Comments)    Unknown reaction   Cozaar [Losartan] Other (See Comments)    Myalgias    Lipitor [Atorvastatin] Other (See Comments)    Unknown reaction   Ms Contin [Morphine] Other (See Comments)    Unknown reaction   Neurontin [Gabapentin] Nausea Only   Reglan [Metoclopramide] Other (See Comments)    Tremors    Valtrex [Valacyclovir] Other (See Comments)    Tremors    Aldactone [Spironolactone] Itching   Apresoline [Hydralazine] Anxiety   Norvasc [Amlodipine] Itching    Does cause itching. Pt does take PRN if needed.    Medications Prior to Admission  Medication Sig Dispense Refill   acetaminophen (TYLENOL) 500 MG tablet Take 1,000 mg by mouth every 6 (six) hours as needed for moderate pain, fever or headache.     amLODipine (NORVASC) 10 MG tablet Take 1 tablet (10 mg total) by mouth daily. (Patient taking differently: Take 10 mg by mouth daily as needed (sBP > 140 after all medications have been taken).) 30 tablet 0   atenolol (TENORMIN) 25 MG tablet Take 25 mg by mouth 2 (two) times daily.     hydrochlorothiazide (HYDRODIURIL) 25 MG tablet Take 25 mg by mouth every other day.     isosorbide mononitrate (IMDUR) 60 MG 24 hr tablet Take 60 mg by mouth 2 (two) times daily.     lisinopril (ZESTRIL) 40 MG tablet Take 1 tablet (40 mg total) by mouth 2 (two) times daily. 180 tablet 0   meclizine (ANTIVERT) 25 MG tablet Take 1 tablet by mouth three times daily as needed (Patient taking differently: Take 25 mg by mouth 3 (three) times daily as needed for dizziness.) 40 tablet 5   neomycin-polymyxin b-dexamethasone (MAXITROL) 3.5-10000-0.1 SUSP Place 1 drop into both eyes 2 (two) times daily. 10 day course.     nitroGLYCERIN (NITROSTAT) 0.4 MG SL tablet Place 0.4 mg under the tongue every 5 (five) minutes as needed for chest pain.     Polyethyl Glycol-Propyl Glycol (SYSTANE OP) Place 1 drop into both eyes 4 (four) times daily as needed (dry eyes).     warfarin (COUMADIN) 2  MG tablet Take 2-4 mg by mouth See admin instructions. 2 mg every Sunday and Wednesday night at 2000. Take 4 mg at 2000 on all other days.     glucose blood (ONE TOUCH ULTRA TEST) test strip USE 1 STRIP TO CHECK GLUCOSE TWICE DAILY  Dx code: E11.9 200 each 3   glucose blood (ONE TOUCH ULTRA TEST) test strip USE ONE STRIP TO CHECK GLUCOSE ONCE  DAILY 100 each 3   glucose blood test strip Use to check blood sugars once daily. 100 each 12   Lancets (ONETOUCH DELICA PLUS LANCET33G) MISC Place 2 Devices inside cheek daily. DX  Code E11.9. 100 each 2     Physical Exam: Blood pressure (!) 168/62, pulse 89, temperature 98.6 F (37 C), temperature source Oral, resp. rate 16, height 5' 5.98" (1.676 m), weight 54.4 kg, SpO2 97 %. General: resting comfortably, appears stated age, no apparent distress Neurological: alert and oriented, no focal deficits HEENT: normocephalic, atraumatic Resp: nonlabored respirations Abdomen: mildly distended but very soft and compressible, nontender to palpation. Well-healed lower midline scar. Extremities: warm and well-perfused, no deformities, moving all extremities spontaneously Psychiatric: normal mood and affect Skin: warm and dry, no jaundice, no rashes or lesions   Results for orders placed or performed during the hospital encounter of 10/23/22 (from the past 48 hour(s))  Protime-INR     Status: Abnormal   Collection Time: 10/30/22  7:30 AM  Result Value Ref Range   Prothrombin Time 25.3 (H) 11.4 - 15.2 seconds   INR 2.3 (H) 0.8 - 1.2    Comment: (NOTE) INR goal varies based on device and disease states. Performed at [facility], [address] [place], [place] [address]   CBC with Differential/Platelet     Status: Abnormal   Collection Time: 10/30/22  7:30 AM  Result Value Ref Range   WBC 8.7 4.0 - 10.5 K/uL   RBC 3.53 (L) 3.87 - 5.11 MIL/uL   Hemoglobin 11.3 (L) 12.0 - 15.0 g/dL   HCT 60.4 (L) 54.0 - 98.1 %   MCV 95.5 80.0 - 100.0 fL   MCH  32.0 26.0 - 34.0 pg   MCHC 33.5 30.0 - 36.0 g/dL   RDW 19.1 47.8 - 29.5 %   Platelets 251 150 - 400 K/uL   nRBC 0.0 0.0 - 0.2 %   Neutrophils Relative % 74 %   Neutro Abs 6.5 1.7 - 7.7 K/uL   Lymphocytes Relative 11 %   Lymphs Abs 1.0 0.7 - 4.0 K/uL   Monocytes Relative 13 %   Monocytes Absolute 1.1 (H) 0.1 - 1.0 K/uL   Eosinophils Relative 1 %   Eosinophils Absolute 0.1 0.0 - 0.5 K/uL   Basophils Relative 0 %   Basophils Absolute 0.0 0.0 - 0.1 K/uL   Immature Granulocytes 1 %   Abs Immature Granulocytes 0.04 0.00 - 0.07 K/uL    Comment: Performed at [facility], [address], [place], [place] [address]  Comprehensive metabolic panel     Status: Abnormal   Collection Time: 10/30/22  7:30 AM  Result Value Ref Range   Sodium 134 (L) 135 - 145 mmol/L   Potassium 3.9 3.5 - 5.1 mmol/L   Chloride 97 (L) 98 - 111 mmol/L   CO2 28 22 - 32 mmol/L   Glucose, Bld 234 (H) 70 - 99 mg/dL    Comment: Glucose reference range applies only to samples taken after fasting for at least 8 hours.   BUN 16 8 - 23 mg/dL   Creatinine, Ser 8.65 0.44 - 1.00 mg/dL   Calcium 8.5 (L) 8.9 - 10.3 mg/dL   Total Protein 5.5 (L) 6.5 - 8.1 g/dL   Albumin 2.7 (L) 3.5 - 5.0 g/dL   AST 23 15 - 41 U/L   ALT 19 0 - 44 U/L   Alkaline Phosphatase 57 38 - 126 U/L   Total Bilirubin 1.1 0.3 - 1.2 mg/dL   GFR, Estimated >78 >46 mL/min    Comment: (NOTE) Calculated using the CKD-EPI Creatinine Equation (2021)    Anion gap 9 5 - 15  Comment: Performed at [facility], [address] [place], [place] [address]  Magnesium     Status: None   Collection Time: 10/30/22  7:30 AM  Result Value Ref Range   Magnesium 1.8 1.7 - 2.4 mg/dL    Comment: Performed at [facility], [address] [place], [place] [address]  Phosphorus     Status: Abnormal   Collection Time: 10/30/22  7:30 AM  Result Value Ref Range   Phosphorus 2.2 (L) 2.5 - 4.6 mg/dL    Comment: Performed at [facility], [address] [place], [place] [address]  Glucose, capillary     Status: Abnormal   Collection Time: 10/30/22 12:02 PM  Result Value Ref Range   Glucose-Capillary 323 (H) 70 - 99 mg/dL    Comment: Glucose reference range applies only to samples taken after fasting for at least 8 hours.   Comment 1 Notify RN    Comment 2 Document in Chart   Glucose, capillary     Status: Abnormal   Collection Time: 10/30/22  5:12 PM  Result Value Ref Range   Glucose-Capillary 103 (H) 70 - 99 mg/dL    Comment: Glucose reference range applies only to samples taken after fasting for at least 8 hours.  Glucose, capillary     Status: Abnormal   Collection Time: 10/30/22  8:44 PM  Result Value Ref Range   Glucose-Capillary 214 (H) 70 - 99 mg/dL    Comment: Glucose reference range applies only to samples taken after fasting for at least 8 hours.   Comment 1 Notify RN    Comment 2 Document in Chart   Protime-INR     Status: Abnormal   Collection Time: 10/31/22  4:16 AM  Result Value Ref Range   Prothrombin Time 24.4 (H) 11.4 - 15.2 seconds   INR 2.2 (H) 0.8 - 1.2    Comment: (NOTE) INR goal varies based on device and disease states. Performed at [facility], [address] [place], [place] [address]   CBC with Differential/Platelet     Status: Abnormal   Collection Time: 10/31/22  4:16 AM  Result Value Ref Range   WBC 8.1 4.0 - 10.5 K/uL   RBC 3.28 (L) 3.87 - 5.11 MIL/uL   Hemoglobin 10.3 (L) 12.0 - 15.0 g/dL   HCT 29.5 (L) 62.1 - 30.8 %   MCV 97.3 80.0 - 100.0 fL   MCH 31.4 26.0 - 34.0 pg   MCHC 32.3 30.0 - 36.0 g/dL   RDW 65.7 84.6 - 96.2 %   Platelets 252 150 - 400 K/uL   nRBC 0.0 0.0 - 0.2 %   Neutrophils Relative % 70 %   Neutro Abs 5.6 1.7 - 7.7 K/uL   Lymphocytes Relative 13 %   Lymphs Abs 1.1 0.7 - 4.0 K/uL   Monocytes Relative 15 %   Monocytes Absolute 1.2 (H) 0.1 - 1.0 K/uL   Eosinophils Relative 2 %   Eosinophils Absolute 0.2 0.0 - 0.5 K/uL   Basophils Relative 0 %   Basophils  Absolute 0.0 0.0 - 0.1 K/uL   Immature Granulocytes 0 %   Abs Immature Granulocytes 0.03 0.00 - 0.07 K/uL    Comment: Performed at [facility], [address] [place], [place] [address]  Comprehensive metabolic panel     Status: Abnormal   Collection Time: 10/31/22  4:16 AM  Result Value Ref Range   Sodium 135 135 -  145 mmol/L   Potassium 4.0 3.5 - 5.1 mmol/L   Chloride 99 98 - 111 mmol/L   CO2 29 22 - 32 mmol/L   Glucose, Bld 223 (H) 70 - 99 mg/dL    Comment: Glucose reference range applies only to samples taken after fasting for at least 8 hours.   BUN 19 8 - 23 mg/dL   Creatinine, Ser 1.61 (L) 0.44 - 1.00 mg/dL   Calcium 8.4 (L) 8.9 - 10.3 mg/dL   Total Protein 4.8 (L) 6.5 - 8.1 g/dL   Albumin 2.3 (L) 3.5 - 5.0 g/dL   AST 36 15 - 41 U/L   ALT 30 0 - 44 U/L   Alkaline Phosphatase 54 38 - 126 U/L   Total Bilirubin 0.9 0.3 - 1.2 mg/dL   GFR, Estimated >09 >60 mL/min    Comment: (NOTE) Calculated using the CKD-EPI Creatinine Equation (2021)    Anion gap 7 5 - 15    Comment: Performed at [facility], [address], [place], [place] [address]  Magnesium     Status: None   Collection Time: 10/31/22  4:16 AM  Result Value Ref Range   Magnesium 2.0 1.7 - 2.4 mg/dL    Comment: Performed at [facility], [address], [place], [place] [address]  Phosphorus     Status: None   Collection Time: 10/31/22  4:16 AM  Result Value Ref Range   Phosphorus 3.3 2.5 - 4.6 mg/dL    Comment: Performed at [facility], [address], [place], [place] [address]  Glucose, capillary     Status: Abnormal   Collection Time: 10/31/22  6:27 AM  Result Value Ref Range   Glucose-Capillary 198 (H) 70 - 99 mg/dL    Comment: Glucose reference range applies only to samples taken after fasting for at least 8 hours.   Comment 1 Notify RN    Comment 2 Document in Chart   Glucose, capillary     Status: Abnormal   Collection Time: 10/31/22 11:02 AM  Result Value Ref Range    Glucose-Capillary 260 (H) 70 - 99 mg/dL    Comment: Glucose reference range applies only to samples taken after fasting for at least 8 hours.  Glucose, capillary     Status: Abnormal   Collection Time: 10/31/22  4:38 PM  Result Value Ref Range   Glucose-Capillary 191 (H) 70 - 99 mg/dL    Comment: Glucose reference range applies only to samples taken after fasting for at least 8 hours.   Comment 1 Notify RN    Comment 2 Document in Chart   Glucose, capillary     Status: None   Collection Time: 10/31/22  8:23 PM  Result Value Ref Range   Glucose-Capillary 96 70 - 99 mg/dL    Comment: Glucose reference range applies only to samples taken after fasting for at least 8 hours.  Protime-INR     Status: Abnormal   Collection Time: 11/01/22  4:11 AM  Result Value Ref Range   Prothrombin Time 21.5 (H) 11.4 - 15.2 seconds   INR 1.8 (H) 0.8 - 1.2    Comment: (NOTE) INR goal varies based on device and disease states. Performed at [facility], [address] [place], [place] [address]   CBC with Differential/Platelet     Status: Abnormal   Collection Time: 11/01/22  4:11 AM  Result Value Ref Range   WBC 8.9 4.0 - 10.5 K/uL  RBC 3.63 (L) 3.87 - 5.11 MIL/uL   Hemoglobin 11.6 (L) 12.0 - 15.0 g/dL   HCT 40.9 (L) 81.1 - 91.4 %   MCV 97.2 80.0 - 100.0 fL   MCH 32.0 26.0 - 34.0 pg   MCHC 32.9 30.0 - 36.0 g/dL   RDW 78.2 95.6 - 21.3 %   Platelets 300 150 - 400 K/uL   nRBC 0.0 0.0 - 0.2 %   Neutrophils Relative % 75 %   Neutro Abs 6.7 1.7 - 7.7 K/uL   Lymphocytes Relative 11 %   Lymphs Abs 0.9 0.7 - 4.0 K/uL   Monocytes Relative 12 %   Monocytes Absolute 1.0 0.1 - 1.0 K/uL   Eosinophils Relative 1 %   Eosinophils Absolute 0.1 0.0 - 0.5 K/uL   Basophils Relative 0 %   Basophils Absolute 0.0 0.0 - 0.1 K/uL   Immature Granulocytes 1 %   Abs Immature Granulocytes 0.05 0.00 - 0.07 K/uL    Comment: Performed at [facility], [address], [place], [place] [address]  Comprehensive  metabolic panel     Status: Abnormal   Collection Time: 11/01/22  4:11 AM  Result Value Ref Range   Sodium 137 135 - 145 mmol/L   Potassium 4.4 3.5 - 5.1 mmol/L   Chloride 98 98 - 111 mmol/L   CO2 29 22 - 32 mmol/L   Glucose, Bld 211 (H) 70 - 99 mg/dL    Comment: Glucose reference range applies only to samples taken after fasting for at least 8 hours.   BUN 18 8 - 23 mg/dL   Creatinine, Ser 8.46 0.44 - 1.00 mg/dL   Calcium 8.9 8.9 - 96.2 mg/dL   Total Protein 5.7 (L) 6.5 - 8.1 g/dL   Albumin 2.7 (L) 3.5 - 5.0 g/dL   AST 40 15 - 41 U/L   ALT 36 0 - 44 U/L   Alkaline Phosphatase 63 38 - 126 U/L   Total Bilirubin 1.5 (H) 0.3 - 1.2 mg/dL   GFR, Estimated >95 >28 mL/min    Comment: (NOTE) Calculated using the CKD-EPI Creatinine Equation (2021)    Anion gap 10 5 - 15    Comment: Performed at [facility], [address], [place], [place] [address]  Magnesium     Status: None   Collection Time: 11/01/22  4:11 AM  Result Value Ref Range   Magnesium 1.9 1.7 - 2.4 mg/dL    Comment: Performed at [facility], [address], [place], [place] [address]  Phosphorus     Status: None   Collection Time: 11/01/22  4:11 AM  Result Value Ref Range   Phosphorus 2.7 2.5 - 4.6 mg/dL    Comment: Performed at [facility], [address] [place], [place] [address]   DG Abd 1 View  Result Date: 10/31/2022 CLINICAL DATA:  Abdominal distension.  Abdominal discomfort. EXAM: ABDOMEN - 1 VIEW COMPARISON:  Pelvis 10/25/2022. FINDINGS: Dilated loops of small bowel present. No definite free air is present the supine view. Moderate stool is present the ascending colon. Right hip ORIF noted. IMPRESSION: Dilated loops of small bowel compatible with small bowel obstruction. Electronically Signed   By: Marin Roberts M.D.   On: 10/31/2022 14:27      Assessment/Plan 89 yo female s/p IMN for intertrochanteric femur fracture. Now with mild abdominal distension and concern for SBO on  abdominal plain film. She is not clinically obstructed as she is passing  flatus and her abdominal exam is very benign. This is more likely a postoperative ileus. I do not feel she needs NG decompression in the absence of nausea and vomiting. CT scan with oral contrast is pending. No indication for acute surgical intervention. Surgery will follow.   Sophronia Simas, MD [facility] Surgery General, Hepatobiliary and Pancreatic Surgery

## 2022-11-01 NOTE — Progress Notes (Signed)
Rounding Note    Patient Name: Mikhail Gebel Date of Encounter: 11/01/2022  [facility] Cardiologist: None   Subjective   Denies any dyspnea or chest pain  Inpatient Medications    Scheduled Meds:  atenolol  25 mg Oral BID   insulin aspart  0-9 Units Subcutaneous TID WC   isosorbide mononitrate  60 mg Oral BID   lisinopril  40 mg Oral BID   multivitamin with minerals  1 tablet Oral Daily   neomycin-polymyxin b-dexamethasone  1 drop Both Eyes BID   polyethylene glycol  17 g Oral BID   QUEtiapine  12.5 mg Oral QHS   senna-docusate  1 tablet Oral BID   warfarin  4 mg Oral ONCE-1600   Warfarin - Pharmacist Dosing Inpatient   Does not apply q1600   Continuous Infusions:  methocarbamol (ROBAXIN) IV     PRN Meds: acetaminophen, alum & mag hydroxide-simeth, bisacodyl, HYDROmorphone (DILAUDID) injection, labetalol, meclizine, menthol-cetylpyridinium **OR** phenol, methocarbamol **OR** methocarbamol (ROBAXIN) IV, oxyCODONE, polyvinyl alcohol   Vital Signs    Vitals:   10/31/22 1900 10/31/22 2333 11/01/22 0434 11/01/22 0821  BP: (!) 158/73 (!) 182/69 (!) 168/62 (!) 179/61  Pulse: 86 78 89 93  Resp: 16 16 16 16   Temp: 98.6 F (37 C) 98.7 F (37.1 C) 98.6 F (37 C) 98.3 F (36.8 C)  TempSrc: Oral Oral Oral Oral  SpO2: 97% 98% 97% 97%  Weight:      Height:        Intake/Output Summary (Last 24 hours) at 11/01/2022 1041 Last data filed at 11/01/2022 0300 Gross per 24 hour  Intake 120 ml  Output 800 ml  Net -680 ml       10/25/2022    2:43 PM 10/23/2022   11:29 PM 10/23/2022    1:30 PM  Last 3 Weights  Weight (lbs) 119 lb 14.9 oz 119 lb 14.9 oz 120 lb  Weight (kg) 54.4 kg 54.4 kg 54.432 kg      Telemetry    Afib rate 80-90s - Personally Reviewed  ECG    NO new ECG - Personally Reviewed  Physical Exam   GEN: No acute distress.   Neck: No JVD Cardiac: irregular, normal rate, 3/6 systolic murmur Respiratory: Clear to auscultation  bilaterally. GI: Soft, nontender MS: No edema; No deformity. Neuro:  Nonfocal  Psych: Normal affect   Labs    High Sensitivity Troponin:  No results for input(s): "TROPONINIHS" in the last 720 hours.   Chemistry Recent Labs  Lab 10/30/22 0730 10/31/22 0416 11/01/22 0411  NA 134* 135 137  K 3.9 4.0 4.4  CL 97* 99 98  CO2 28 29 29   GLUCOSE 234* 223* 211*  BUN 16 19 18   CREATININE 0.54 0.43* 0.55  CALCIUM 8.5* 8.4* 8.9  MG 1.8 2.0 1.9  PROT 5.5* 4.8* 5.7*  ALBUMIN 2.7* 2.3* 2.7*  AST 23 36 40  ALT 19 30 36  ALKPHOS 57 54 63  BILITOT 1.1 0.9 1.5*  GFRNONAA >60 >60 >60  ANIONGAP 9 7 10      Lipids  Recent Labs  Lab 10/29/22 0455  CHOL 133  TRIG 59  HDL 52  LDLCALC 69  CHOLHDL 2.6     Hematology Recent Labs  Lab 10/30/22 0730 10/31/22 0416 11/01/22 0411  WBC 8.7 8.1 8.9  RBC 3.53* 3.28* 3.63*  HGB 11.3* 10.3* 11.6*  HCT 33.7* 31.9* 35.3*  MCV 95.5 97.3 97.2  MCH 32.0 31.4 32.0  MCHC  33.5 32.3 32.9  RDW 13.5 13.6 13.9  PLT 251 252 300    Thyroid  No results for input(s): "TSH", "FREET4" in the last 168 hours.   BNPNo results for input(s): "BNP", "PROBNP" in the last 168 hours.  DDimer No results for input(s): "DDIMER" in the last 168 hours.   Radiology    DG Abd 1 View  Result Date: 11/01/2022 CLINICAL DATA:  Abdominal distension EXAM: ABDOMEN - 1 VIEW COMPARISON:  10/31/2022 FINDINGS: Gaseous distension of large and small bowel loops throughout the abdomen. Prominent amount of stool in the right colon. No gross free intraperitoneal air. Diffuse osseous demineralization. Prior right hip ORIF. IMPRESSION: Gaseous distension of large and small bowel loops throughout the abdomen, which may reflect ileus or obstruction. Electronically Signed   By: Duanne Guess D.O.   On: 11/01/2022 09:29   DG Abd 1 View  Result Date: 10/31/2022 CLINICAL DATA:  Abdominal distension.  Abdominal discomfort. EXAM: ABDOMEN - 1 VIEW COMPARISON:  Pelvis 10/25/2022.  FINDINGS: Dilated loops of small bowel present. No definite free air is present the supine view. Moderate stool is present the ascending colon. Right hip ORIF noted. IMPRESSION: Dilated loops of small bowel compatible with small bowel obstruction. Electronically Signed   By: Marin Roberts M.D.   On: 10/31/2022 14:27    Cardiac Studies     Patient Profile     89 y.o. female with a hx of type 2 DM, chronic A fb on coumadin, sick sinus syndrome status post pacemaker implant, diastolic heart failure, HTN, HLD, remote left breast cancer status post lumpectomy and radiation, essential tremor, who is being seen 10/29/2022 for the evaluation of valvular disease   Assessment & Plan    ?AV vegetation: TTE 10/26/22 read as possible AV vegetation.  I do not see a vegetation but valve is heavily calcified.  Recommend checking blood cultures.  If negative, no further work-up recommended at this time. -Blood cultures no growth x 72 hours  Aortic stenosis. TTE 10/26/22 read as moderate to severe AS.  Appears moderate on my read (V-max 3.1 m/s, mean gradient 21 mmHg, DI 0.32).  Monitor as outpatient  Mitral valve disease: TTE 10/26/22 read as moderate to severe MR, moderate MS.  On my read appears only mild MR, no MS (mean gradient ).    Chronic Afib: on atenolol for rate control, appears rate controlled.  Coumadin per pharmacy  SSS: s/p PPM in 2017  Femur fracture: s.p mechanical fall in hospital.  Underwent surgery 5/12.  [facility] will sign off.   Medication Recommendations:  No changes Other recommendations (labs, testing, etc):  None Follow up as an outpatient:  Will need to f/u with her cardiologist at [facility]   For questions or updates, please contact [facility] Please consult [email or web address] for contact info under        Signed, Little Ishikawa, MD  11/01/2022, 10:41 AM

## 2022-11-01 NOTE — Progress Notes (Addendum)
PROGRESS NOTE    Leah Holmes  ZOX:[id number] DOB: [birth year] DOA: 10/23/2022 PCP: Sherlene Shams, MD   Brief Narrative:  The patient is a 89 year old with a history of DM2, CAD, chronic atrial fibrillation on warfarin, HTN, HLD, anxiety/depression, remote treatment for breast cancer, and SSS status post pacemaker placement who presented to the [facility] 5/10 with a 1 week history of HA and hallucinations. She had been evaluated by an Ophthalmologist in the outpatient setting who found no acute issues. She was transferred to [facility] ER from [facility] to undergo an MRI. CT head at presentation revealed no acute findings. CXR was unrevealing. Head CT x 2 showed no acute findings carotid Dopplers without significant stenosis bilaterally. Subsequently she had mechanical fall in the hospital which resulted in a comminuted displaced intertrochanteric fracture of the right femur and she underwent intramedullary nailing on 10/25/2022. Patient is having trouble with swallowing pills so SLP will be consulted and PT OT recommending SNF.    Patient is having abdominal distention and there is concern for small bowel obstruction so we will obtain a CT scan of the abdomen pelvis with oral and IV contrast and I have notified the general surgery team and they will evaluate the patient tomorrow mornin   Assessment and Plan: Mechanical fall in the hospital with resultant comminuted displaced intertrochanteric fracture of the right femur S/p intramedullary nailing 5/12 -Postoperative care per Orthopedic Surgery  -Warfarin resumed yesterday, with INR notably still within therapeutic range however will hold given ? Valve Vegetation -Continue with bowel regimen -Orthopedic surgery recommending advancing diet up with therapy and also recommending partial weightbearing in the right lower extremity with follow-up in 2 weeks recommending continue to keep the dressings in place unless soiled -PT OT recommending SNF and  has a bed available anticipating discharging on Monday morning after cultures are negative for 48 hours   Persisting HA with visual hallucinations, improved -Has been evaluated by Neurology - EEG unrevealing - CT head x2 w/o acute findings  -Carotid Dopplers without evidence of significant stenosis bilaterally  -No further inpatient w/u is felt to be indicated and MRI was thought to be low yield in setting of non-focal sx w/ 2 negative CT  -Neuro opined this may be the initial onset of Lewy body dementia but after discussion with Neuro again will get MRI formally given ? Valve Vegetation -MRI and MRA done and showed "Brain MRI: Involuting brain without acute or reversible finding. MRA: No emergent finding. Atherosclerosis with moderate stenoses of the right M1 and left A2 segments." -Initiated a trial of low dose seroquel as suggested by Neuro but given her advanced age was begun at a low dose nightly only  -Dose will be held each night if the patient is sedated during the day but if she tolerates it without significant difficulty we could be increased to twice daily dosing   Abdominal Distention with concern for SBO but likely ileus -Examined patient today patient was having abdominal discomfort and was more distended and hypertympanic to percussion.  On auscultation she had hyperactive bowel sounds -KUB was ordered and done and showed "Dilated loops of small bowel present. No definite free air is present the supine view. Moderate stool is present the ascending colon. Right hip ORIF noted." -Repeat KUB today done and showed "Gaseous distension of large and small bowel loops throughout the abdomen, which may reflect ileus or obstruction." -She is not having any nausea or vomiting so we will not place an NG tube  and continue Diet now per Discussion with Gen Surgery -Consulted Gen Surgery and Case was discussed with general surgery Dr. Sophronia Simas who recommends obtaining a CT scan of the abdomen  pelvis with oral and IV contrast a -CT Scan done and pending official read but discussed case with Dr. Freida Busman Dr. Harold Hedge believes that there is contrast in the colon and the small bowel looks normal and the thinking was that it was an ileus so we will continue bowel regimen   Chronic atrial fibrillation on warfarin Rate is well-controlled  -Unclear why patient is not on DOAC instead of warfarin (despite extensive review of outpatient records); Family checking with Primary Cardiologist for pharmacy review she does not have prescription medication coverage found which is likely the reason because cost may have been a barrier to switching to DOAC in the past: The cardiologist here recommended considering switching to Eliquis 2.5 mg twice daily given her age and weight but for now we will resume the home Coumadin -Resume Warfarin with Pharmacy to dose -Continue monitor PT INR and was 21.5/1.8 respectively   Dysphagia -Obtain SLP evaluation and place on Soft Diet  -SLP evaluated and recommending continuing Dysphagia 3 diet with thin liquids for now but will held diet given concern for SBO but given bowel movements and improvement can resume Diet  Hyperbilirubinemia -Mild and likely reactive -T Bili Trend: Recent Labs  Lab 10/23/22 1339 10/28/22 0814 10/29/22 0455 10/30/22 0730 10/31/22 0416 11/01/22 0411  BILITOT 0.8 1.1 1.0 1.1 0.9 1.5*  -To monitor and trend and repeat CMP in a.m.   Hypokalemia -Patient's K+ Level Trend: Recent Labs  Lab 10/26/22 0414 10/27/22 0724 10/28/22 0814 10/29/22 0455 10/30/22 0730 10/31/22 0416 11/01/22 0411  K 3.5 3.5 4.5 4.0 3.9 4.0 4.4  -Continue to Monitor and Replete as Necessary -Repeat CMP in the AM    Poor Po Intake -Nutritionist Consulted and Initiated Feeding Supplements TID and see below   Hypophosphatemia -Phos Level Trend Recent Labs  Lab 10/28/22 0814 10/29/22 0455 10/30/22 0730 10/31/22 0416 11/01/22 0411  PHOS 2.1* 2.6 2.2* 3.3  2.7  -Continue to Monitor and replete as Necessary -Repeat Phos Level in the AM   Hypomagnesemia -Patient's Mag Level Trend: Recent Labs  Lab 10/26/22 0414 10/27/22 0724 10/28/22 0814 10/29/22 0455 10/30/22 0730 10/31/22 0416 11/01/22 0411  MG 1.5* 2.1 1.9 1.8 1.8 2.0 1.9  -Continue to Monitor and Replete as Necessary -Repeat Mag in the AM    HTN -Adjustments made in blood pressure regimen last night -BP at goal presently -avoid overaggressive correction given advanced age -Continue to Monitor BP per Protocol -Last BP reading was 120/60   CAD -Diffuse three-vessel CAD by cardiac cath 2012 with critical mid LAD stenosis not amenable to PCI -has declined follow-up testing as she would not wish to pursue intervention -Nno recent anginal symptoms with exertion   DM2 A1c 6.9 09/07/2022 - CBG reasonably well-controlled presently -monitor trend without change in treatment today -CBG Trend:  Recent Labs  Lab 10/31/22 0627 10/31/22 1102 10/31/22 1638 10/31/22 2023 11/01/22 0920 11/01/22 1203 11/01/22 1620  GLUCAP 198* 260* 191* 96 209* 197* 134*    SSS status post pacemaker insertion 2017 -Will need MRI Pacer Protocol and Cardiology evaluation given Moderate to Severe Aortic Stenosis -Cardiology evaluated and recommending outpatient monitoring   HLD -Continue usual medical therapy but will hold oral Statin given NPO Status now   Aortic Stenosis -Graded as mild via TTE 2022 -Repeat ECHO this visit done  and showed an EF of 60 to 65% with moderate left concentric hypertrophy and moderate to severe aortic stenosis present -Will formally consult cardiology for further evaluation recommendations and they reviewed the echo.  Dr. Bjorn Pippin feels that she will need outpatient monitoring for her aortic stenosis and they are going to monitor as an outpatient   ?  Aortic valve vegetation -TTE done and showed "Findings consistent with hypertrophic cardiomyopathy. Findings concerning  for aortic valve vegetation, would recommend a Transesophageal Echocardiogram for clarification." -Consult cardiology formally Dr. Bjorn Pippin reviewed the echo and does not see a vegetation but feels that the aortic valve is heavily calcified -MRI as above -Currently blood cultures are showing no growth to date -Cardiology recommending checking blood cultures if negative no further workup is recommended; blood cultures have been ordered and cardiology feels if they are no growth at 48 hours then she can be discharged to SNF but now having abdominal distention and a possible small bowel obstruction; Blood Cx showing NGTD at 3 Days   Hypoalbuminemia -Patient's Albumin Trend: Recent Labs  Lab 10/23/22 1339 10/28/22 0814 10/29/22 0455 10/30/22 0730 10/31/22 0416 11/01/22 0411  ALBUMIN 4.0 2.9* 2.9* 2.7* 2.3* 2.7*  -Continue to Monitor and Trend and repeat CMP in the AM    Severe Protein Calorie Malnutrition Nutrition Status: Nutrition Problem: Severe Malnutrition (in the context of chronic illness) Etiology:  (inadequate energy intake) Signs/Symptoms: severe fat depletion, severe muscle depletion Interventions: Refer to RD note for recommendations  DVT prophylaxis: SCDs Start: 10/25/22 1914 Place TED hose Start: 10/25/22 1914    Code Status: DNR Family Communication: No family present at bedside  Disposition Plan:  Level of care: Med-Surg Status is: Inpatient Remains inpatient appropriate because: SNF and anticipating discharge in next 24 to 48 hours   Consultants:  Orthopedic Surgery Cardiology Neurology General Surgery   Procedures:  As delineated as above  Antimicrobials:  Anti-infectives (From admission, onward)    Start     Dose/Rate Route Frequency Ordered Stop   10/25/22 2200  ceFAZolin (ANCEF) IVPB 2g/100 mL premix        2 g 200 mL/hr over 30 Minutes Intravenous Every 6 hours 10/25/22 1913 10/26/22 0420   10/25/22 1407  ceFAZolin (ANCEF) 2-4 GM/100ML-% IVPB   Status:  Discontinued       Note to Pharmacy: Kathrene Bongo D: cabinet override      10/25/22 1407 10/25/22 1447   10/25/22 1400  ceFAZolin (ANCEF) IVPB 2g/100 mL premix        2 g 200 mL/hr over 30 Minutes Intravenous On call to O.R. 10/25/22 1336 10/25/22 1616       Subjective: Seen and examined at bedside and thinks her abdomen is doing much better and had a very large bowel movement.  She is awake and alert today.  No nausea or vomiting.  Denies any lightheadedness or dizziness and thinks her pain is doing okay today.  No other concerns or complaints at this time  Objective: Vitals:   11/01/22 0434 11/01/22 0821 11/01/22 1202 11/01/22 1625  BP: (!) 168/62 (!) 179/61 (!) 155/65 (!) 147/66  Pulse: 89 93 70 70  Resp: 16 16 17 16   Temp: 98.6 F (37 C) 98.3 F (36.8 C) 98.5 F (36.9 C) 98 F (36.7 C)  TempSrc: Oral Oral Oral Oral  SpO2: 97% 97% 93% 96%  Weight:      Height:        Intake/Output Summary (Last 24 hours) at 11/01/2022 1733 Last data  filed at 11/01/2022 1420 Gross per 24 hour  Intake 120 ml  Output 800 ml  Net -680 ml   Filed Weights   10/23/22 2329 10/25/22 1443  Weight: 54.4 kg 54.4 kg   Examination: Physical Exam:  Constitutional: Thin elderly chronically ill-appearing Caucasian female who appears more comfortable today and not in as much discomfort Respiratory: Diminished to auscultation bilaterally with coarse breath sounds, no wheezing, rales, rhonchi or crackles. Normal respiratory effort and patient is not tachypenic. No accessory muscle use.  Unlabored breathing Cardiovascular: RRR, no murmurs / rubs / gallops. S1 and S2 auscultated. No extremity edema.  Abdomen: Soft, minimally-tender, not as distended as yesterday.  Bowel sounds positive.  GU: Deferred. Musculoskeletal: No clubbing / cyanosis of digits/nails. No joint deformity upper and lower extremities Skin: No rashes, lesions, ulcers on a limited skin evaluation. No induration; Warm and  dry.  Neurologic: CN 2-12 grossly intact with no focal deficits. Romberg sign and cerebellar reflexes not assessed.  Psychiatric: Normal judgment and insight. Alert and oriented x 3. Normal mood and appropriate affect.   Data Reviewed: I have personally reviewed following labs and imaging studies  CBC: Recent Labs  Lab 10/28/22 0814 10/29/22 0455 10/30/22 0730 10/31/22 0416 11/01/22 0411  WBC 11.1* 10.5 8.7 8.1 8.9  NEUTROABS 9.0* 8.3* 6.5 5.6 6.7  HGB 12.0 11.4* 11.3* 10.3* 11.6*  HCT 34.0* 35.0* 33.7* 31.9* 35.3*  MCV 94.4 95.9 95.5 97.3 97.2  PLT 206 224 251 252 300   Basic Metabolic Panel: Recent Labs  Lab 10/28/22 0814 10/29/22 0455 10/30/22 0730 10/31/22 0416 11/01/22 0411  NA 135 132* 134* 135 137  K 4.5 4.0 3.9 4.0 4.4  CL 100 97* 97* 99 98  CO2 28 25 28 29 29   GLUCOSE 207* 244* 234* 223* 211*  BUN 23 17 16 19 18   CREATININE 0.59 0.51 0.54 0.43* 0.55  CALCIUM 8.7* 8.5* 8.5* 8.4* 8.9  MG 1.9 1.8 1.8 2.0 1.9  PHOS 2.1* 2.6 2.2* 3.3 2.7   GFR: Estimated Creatinine Clearance: 36.9 mL/min (by C-G formula based on SCr of 0.55 mg/dL). Liver Function Tests: Recent Labs  Lab 10/28/22 0814 10/29/22 0455 10/30/22 0730 10/31/22 0416 11/01/22 0411  AST 26 23 23  36 40  ALT 16 17 19 30  36  ALKPHOS 56 52 57 54 63  BILITOT 1.1 1.0 1.1 0.9 1.5*  PROT 5.6* 5.6* 5.5* 4.8* 5.7*  ALBUMIN 2.9* 2.9* 2.7* 2.3* 2.7*   No results for input(s): "LIPASE", "AMYLASE" in the last 168 hours. No results for input(s): "AMMONIA" in the last 168 hours. Coagulation Profile: Recent Labs  Lab 10/28/22 0814 10/29/22 0455 10/30/22 0730 10/31/22 0416 11/01/22 0411  INR 2.3* 2.5* 2.3* 2.2* 1.8*   Cardiac Enzymes: No results for input(s): "CKTOTAL", "CKMB", "CKMBINDEX", "TROPONINI" in the last 168 hours. BNP (last 3 results) No results for input(s): "PROBNP" in the last 8760 hours. HbA1C: No results for input(s): "HGBA1C" in the last 72 hours. CBG: Recent Labs  Lab  10/31/22 1638 10/31/22 2023 11/01/22 0920 11/01/22 1203 11/01/22 1620  GLUCAP 191* 96 209* 197* 134*   Lipid Profile: No results for input(s): "CHOL", "HDL", "LDLCALC", "TRIG", "CHOLHDL", "LDLDIRECT" in the last 72 hours. Thyroid Function Tests: No results for input(s): "TSH", "T4TOTAL", "FREET4", "T3FREE", "THYROIDAB" in the last 72 hours. Anemia Panel: No results for input(s): "VITAMINB12", "FOLATE", "FERRITIN", "TIBC", "IRON", "RETICCTPCT" in the last 72 hours. Sepsis Labs: No results for input(s): "PROCALCITON", "LATICACIDVEN" in the last 168 hours.  Recent Results (  from the past 240 hour(s))  Surgical pcr screen     Status: None   Collection Time: 10/25/22  1:54 PM   Specimen: Nasal Mucosa; Nasal Swab  Result Value Ref Range Status   MRSA, PCR NEGATIVE NEGATIVE Final   Staphylococcus aureus NEGATIVE NEGATIVE Final    Comment: (NOTE) The Xpert SA Assay (FDA approved for NASAL specimens in patients 55 years of age and older), is one component of a comprehensive surveillance program. It is not intended to diagnose infection nor to guide or monitor treatment. Performed at [facility], [address] [place], [place] [address]   Culture, blood (Routine X 2) w Reflex to ID Panel     Status: None (Preliminary result)   Collection Time: 10/29/22  6:54 PM   Specimen: BLOOD  Result Value Ref Range Status   Specimen Description BLOOD SITE NOT SPECIFIED  Final   Special Requests   Final    BOTTLES DRAWN AEROBIC AND ANAEROBIC Blood Culture results may not be optimal due to an excessive volume of blood received in culture bottles   Culture   Final    NO GROWTH 3 DAYS Performed at [facility], [address] [place], [place] [address]    Report Status PENDING  Incomplete  Culture, blood (Routine X 2) w Reflex to ID Panel     Status: None (Preliminary result)   Collection Time: 10/29/22  6:58 PM   Specimen: BLOOD RIGHT ARM  Result Value Ref Range Status   Specimen  Description BLOOD RIGHT ARM  Final   Special Requests   Final    BOTTLES DRAWN AEROBIC AND ANAEROBIC Blood Culture results may not be optimal due to an excessive volume of blood received in culture bottles   Culture   Final    NO GROWTH 3 DAYS Performed at [facility], [address] [place], [place] [address]    Report Status PENDING  Incomplete     Radiology Studies: DG Abd 1 View  Result Date: 11/01/2022 CLINICAL DATA:  Abdominal distension EXAM: ABDOMEN - 1 VIEW COMPARISON:  10/31/2022 FINDINGS: Gaseous distension of large and small bowel loops throughout the abdomen. Prominent amount of stool in the right colon. No gross free intraperitoneal air. Diffuse osseous demineralization. Prior right hip ORIF. IMPRESSION: Gaseous distension of large and small bowel loops throughout the abdomen, which may reflect ileus or obstruction. Electronically Signed   By: Duanne Guess D.O.   On: 11/01/2022 09:29   DG Abd 1 View  Result Date: 10/31/2022 CLINICAL DATA:  Abdominal distension.  Abdominal discomfort. EXAM: ABDOMEN - 1 VIEW COMPARISON:  Pelvis 10/25/2022. FINDINGS: Dilated loops of small bowel present. No definite free air is present the supine view. Moderate stool is present the ascending colon. Right hip ORIF noted. IMPRESSION: Dilated loops of small bowel compatible with small bowel obstruction. Electronically Signed   By: Marin Roberts M.D.   On: 10/31/2022 14:27    Scheduled Meds:  atenolol  25 mg Oral BID   insulin aspart  0-9 Units Subcutaneous TID WC   isosorbide mononitrate  60 mg Oral BID   lisinopril  40 mg Oral BID   multivitamin with minerals  1 tablet Oral Daily   neomycin-polymyxin b-dexamethasone  1 drop Both Eyes BID   polyethylene glycol  17 g Oral BID   QUEtiapine  12.5 mg Oral QHS   senna-docusate  1 tablet Oral BID   Warfarin - Pharmacist Dosing Inpatient  Does not apply q1600   Continuous Infusions:  methocarbamol (ROBAXIN) IV      LOS: 7 days    Marguerita Merles, DO [facility] Available via Epic secure chat 7am-7pm After these hours, please refer to coverage provider listed on [email or web address] 11/01/2022, 5:33 PM

## 2022-11-01 NOTE — Progress Notes (Addendum)
7 Days Post-Op   Subjective/Chief Complaint: Had a couple bms, no ab pain, no n/v   Objective: Vital signs in last 24 hours: Temp:  [98.2 F (36.8 C)-98.7 F (37.1 C)] 98.6 F (37 C) (05/19 0434) Pulse Rate:  [63-89] 89 (05/19 0434) Resp:  [16-18] 16 (05/19 0434) BP: (120-182)/(60-73) 168/62 (05/19 0434) SpO2:  [97 %-100 %] 97 % (05/19 0434) Last BM Date : 10/31/22  Intake/Output from previous day: 05/18 0701 - 05/19 0700 In: 240 [P.O.:240] Out: 1000 [Urine:1000] Intake/Output this shift: No intake/output data recorded.  Ab soft nontender nondistended  Lab Results:  Recent Labs    10/31/22 0416 11/01/22 0411  WBC 8.1 8.9  HGB 10.3* 11.6*  HCT 31.9* 35.3*  PLT 252 300   BMET Recent Labs    10/31/22 0416 11/01/22 0411  NA 135 137  K 4.0 4.4  CL 99 98  CO2 29 29  GLUCOSE 223* 211*  BUN 19 18  CREATININE 0.43* 0.55  CALCIUM 8.4* 8.9   PT/INR Recent Labs    10/31/22 0416 11/01/22 0411  LABPROT 24.4* 21.5*  INR 2.2* 1.8*   ABG No results for input(s): "PHART", "HCO3" in the last 72 hours.  Invalid input(s): "PCO2", "PO2"  Studies/Results: DG Abd 1 View  Result Date: 10/31/2022 CLINICAL DATA:  Abdominal distension.  Abdominal discomfort. EXAM: ABDOMEN - 1 VIEW COMPARISON:  Pelvis 10/25/2022. FINDINGS: Dilated loops of small bowel present. No definite free air is present the supine view. Moderate stool is present the ascending colon. Right hip ORIF noted. IMPRESSION: Dilated loops of small bowel compatible with small bowel obstruction. Electronically Signed   By: Marin Roberts M.D.   On: 10/31/2022 14:27    Anti-infectives: Anti-infectives (From admission, onward)    Start     Dose/Rate Route Frequency Ordered Stop   10/25/22 2200  ceFAZolin (ANCEF) IVPB 2g/100 mL premix        2 g 200 mL/hr over 30 Minutes Intravenous Every 6 hours 10/25/22 1913 10/26/22 0420   10/25/22 1407  ceFAZolin (ANCEF) 2-4 GM/100ML-% IVPB  Status:  Discontinued        Note to Pharmacy: Kathrene Bongo D: cabinet override      10/25/22 1407 10/25/22 1447   10/25/22 1400  ceFAZolin (ANCEF) IVPB 2g/100 mL premix        2 g 200 mL/hr over 30 Minutes Intravenous On call to O.R. 10/25/22 1336 10/25/22 1616       Assessment/Plan: SBO vs ileus on xrays -CT as recommended not done but regardless I think this is ileus. She is having bowel function and this is what xrays and clinical picture appear to be Reasonable to await ct scan today but I think can likely start clears and a bowel regimen.   Needs to be mobilized as much as she can Maintain K and Mg normal  Leah Holmes 11/01/2022

## 2022-11-02 DIAGNOSIS — I340 Nonrheumatic mitral (valve) insufficiency: Secondary | ICD-10-CM | POA: Diagnosis not present

## 2022-11-02 DIAGNOSIS — E43 Unspecified severe protein-calorie malnutrition: Secondary | ICD-10-CM | POA: Diagnosis not present

## 2022-11-02 DIAGNOSIS — Z4789 Encounter for other orthopedic aftercare: Secondary | ICD-10-CM | POA: Diagnosis not present

## 2022-11-02 DIAGNOSIS — F411 Generalized anxiety disorder: Secondary | ICD-10-CM | POA: Diagnosis not present

## 2022-11-02 DIAGNOSIS — I272 Pulmonary hypertension, unspecified: Secondary | ICD-10-CM | POA: Diagnosis not present

## 2022-11-02 DIAGNOSIS — F339 Major depressive disorder, recurrent, unspecified: Secondary | ICD-10-CM | POA: Diagnosis not present

## 2022-11-02 DIAGNOSIS — S72009A Fracture of unspecified part of neck of unspecified femur, initial encounter for closed fracture: Secondary | ICD-10-CM | POA: Diagnosis not present

## 2022-11-02 DIAGNOSIS — I350 Nonrheumatic aortic (valve) stenosis: Secondary | ICD-10-CM | POA: Diagnosis not present

## 2022-11-02 DIAGNOSIS — I482 Chronic atrial fibrillation, unspecified: Secondary | ICD-10-CM | POA: Diagnosis not present

## 2022-11-02 DIAGNOSIS — I071 Rheumatic tricuspid insufficiency: Secondary | ICD-10-CM | POA: Diagnosis not present

## 2022-11-02 DIAGNOSIS — I10 Essential (primary) hypertension: Secondary | ICD-10-CM | POA: Diagnosis not present

## 2022-11-02 DIAGNOSIS — W19XXXD Unspecified fall, subsequent encounter: Secondary | ICD-10-CM | POA: Diagnosis not present

## 2022-11-02 DIAGNOSIS — R262 Difficulty in walking, not elsewhere classified: Secondary | ICD-10-CM | POA: Diagnosis not present

## 2022-11-02 DIAGNOSIS — I517 Cardiomegaly: Secondary | ICD-10-CM | POA: Diagnosis not present

## 2022-11-02 DIAGNOSIS — E1169 Type 2 diabetes mellitus with other specified complication: Secondary | ICD-10-CM | POA: Diagnosis not present

## 2022-11-02 DIAGNOSIS — R5381 Other malaise: Secondary | ICD-10-CM | POA: Diagnosis not present

## 2022-11-02 DIAGNOSIS — R2689 Other abnormalities of gait and mobility: Secondary | ICD-10-CM | POA: Diagnosis not present

## 2022-11-02 DIAGNOSIS — Z9181 History of falling: Secondary | ICD-10-CM | POA: Diagnosis not present

## 2022-11-02 DIAGNOSIS — K589 Irritable bowel syndrome without diarrhea: Secondary | ICD-10-CM | POA: Diagnosis not present

## 2022-11-02 DIAGNOSIS — K9189 Other postprocedural complications and disorders of digestive system: Secondary | ICD-10-CM | POA: Diagnosis not present

## 2022-11-02 DIAGNOSIS — R441 Visual hallucinations: Secondary | ICD-10-CM | POA: Diagnosis not present

## 2022-11-02 DIAGNOSIS — Z681 Body mass index (BMI) 19 or less, adult: Secondary | ICD-10-CM | POA: Diagnosis not present

## 2022-11-02 DIAGNOSIS — R519 Headache, unspecified: Secondary | ICD-10-CM | POA: Diagnosis not present

## 2022-11-02 DIAGNOSIS — S72141D Displaced intertrochanteric fracture of right femur, subsequent encounter for closed fracture with routine healing: Secondary | ICD-10-CM | POA: Diagnosis not present

## 2022-11-02 DIAGNOSIS — I2510 Atherosclerotic heart disease of native coronary artery without angina pectoris: Secondary | ICD-10-CM | POA: Diagnosis not present

## 2022-11-02 DIAGNOSIS — I509 Heart failure, unspecified: Secondary | ICD-10-CM | POA: Diagnosis not present

## 2022-11-02 DIAGNOSIS — Z5181 Encounter for therapeutic drug level monitoring: Secondary | ICD-10-CM | POA: Diagnosis not present

## 2022-11-02 DIAGNOSIS — I110 Hypertensive heart disease with heart failure: Secondary | ICD-10-CM | POA: Diagnosis not present

## 2022-11-02 DIAGNOSIS — R2681 Unsteadiness on feet: Secondary | ICD-10-CM | POA: Diagnosis not present

## 2022-11-02 DIAGNOSIS — S7291XD Unspecified fracture of right femur, subsequent encounter for closed fracture with routine healing: Secondary | ICD-10-CM | POA: Diagnosis not present

## 2022-11-02 DIAGNOSIS — R41841 Cognitive communication deficit: Secondary | ICD-10-CM | POA: Diagnosis not present

## 2022-11-02 DIAGNOSIS — R309 Painful micturition, unspecified: Secondary | ICD-10-CM | POA: Diagnosis not present

## 2022-11-02 DIAGNOSIS — I700 Atherosclerosis of aorta: Secondary | ICD-10-CM | POA: Diagnosis not present

## 2022-11-02 DIAGNOSIS — I480 Paroxysmal atrial fibrillation: Secondary | ICD-10-CM | POA: Diagnosis not present

## 2022-11-02 DIAGNOSIS — R1311 Dysphagia, oral phase: Secondary | ICD-10-CM | POA: Diagnosis not present

## 2022-11-02 DIAGNOSIS — Z743 Need for continuous supervision: Secondary | ICD-10-CM | POA: Diagnosis not present

## 2022-11-02 DIAGNOSIS — R791 Abnormal coagulation profile: Secondary | ICD-10-CM | POA: Diagnosis not present

## 2022-11-02 DIAGNOSIS — R443 Hallucinations, unspecified: Secondary | ICD-10-CM | POA: Diagnosis not present

## 2022-11-02 DIAGNOSIS — S72001A Fracture of unspecified part of neck of right femur, initial encounter for closed fracture: Secondary | ICD-10-CM | POA: Diagnosis not present

## 2022-11-02 DIAGNOSIS — Z7901 Long term (current) use of anticoagulants: Secondary | ICD-10-CM | POA: Diagnosis not present

## 2022-11-02 DIAGNOSIS — M6281 Muscle weakness (generalized): Secondary | ICD-10-CM | POA: Diagnosis not present

## 2022-11-02 DIAGNOSIS — F29 Unspecified psychosis not due to a substance or known physiological condition: Secondary | ICD-10-CM | POA: Diagnosis not present

## 2022-11-02 DIAGNOSIS — G4489 Other headache syndrome: Secondary | ICD-10-CM | POA: Diagnosis not present

## 2022-11-02 DIAGNOSIS — I495 Sick sinus syndrome: Secondary | ICD-10-CM | POA: Diagnosis not present

## 2022-11-02 DIAGNOSIS — E782 Mixed hyperlipidemia: Secondary | ICD-10-CM | POA: Diagnosis not present

## 2022-11-02 DIAGNOSIS — K219 Gastro-esophageal reflux disease without esophagitis: Secondary | ICD-10-CM | POA: Diagnosis not present

## 2022-11-02 DIAGNOSIS — E1142 Type 2 diabetes mellitus with diabetic polyneuropathy: Secondary | ICD-10-CM | POA: Diagnosis not present

## 2022-11-02 DIAGNOSIS — R1030 Lower abdominal pain, unspecified: Secondary | ICD-10-CM | POA: Diagnosis not present

## 2022-11-02 LAB — CBC WITH DIFFERENTIAL/PLATELET
Abs Immature Granulocytes: 0.04 10*3/uL (ref 0.00–0.07)
Basophils Absolute: 0 10*3/uL (ref 0.0–0.1)
Basophils Relative: 0 %
Eosinophils Absolute: 0.3 10*3/uL (ref 0.0–0.5)
Eosinophils Relative: 4 %
HCT: 31.5 % — ABNORMAL LOW (ref 36.0–46.0)
Hemoglobin: 10.2 g/dL — ABNORMAL LOW (ref 12.0–15.0)
Immature Granulocytes: 1 %
Lymphocytes Relative: 15 %
Lymphs Abs: 1.1 10*3/uL (ref 0.7–4.0)
MCH: 31.6 pg (ref 26.0–34.0)
MCHC: 32.4 g/dL (ref 30.0–36.0)
MCV: 97.5 fL (ref 80.0–100.0)
Monocytes Absolute: 1.1 10*3/uL — ABNORMAL HIGH (ref 0.1–1.0)
Monocytes Relative: 16 %
Neutro Abs: 4.5 10*3/uL (ref 1.7–7.7)
Neutrophils Relative %: 64 %
Platelets: 291 10*3/uL (ref 150–400)
RBC: 3.23 MIL/uL — ABNORMAL LOW (ref 3.87–5.11)
RDW: 14.1 % (ref 11.5–15.5)
WBC: 7.1 10*3/uL (ref 4.0–10.5)
nRBC: 0 % (ref 0.0–0.2)

## 2022-11-02 LAB — COMPREHENSIVE METABOLIC PANEL
ALT: 29 U/L (ref 0–44)
AST: 30 U/L (ref 15–41)
Albumin: 2.3 g/dL — ABNORMAL LOW (ref 3.5–5.0)
Alkaline Phosphatase: 57 U/L (ref 38–126)
Anion gap: 6 (ref 5–15)
BUN: 16 mg/dL (ref 8–23)
CO2: 29 mmol/L (ref 22–32)
Calcium: 8.4 mg/dL — ABNORMAL LOW (ref 8.9–10.3)
Chloride: 98 mmol/L (ref 98–111)
Creatinine, Ser: 0.52 mg/dL (ref 0.44–1.00)
GFR, Estimated: >60 mL/min (ref >60)
Glucose, Bld: 127 mg/dL — ABNORMAL HIGH (ref 70–99)
Potassium: 3.8 mmol/L (ref 3.5–5.1)
Sodium: 133 mmol/L — ABNORMAL LOW (ref 135–145)
Total Bilirubin: 1.1 mg/dL (ref 0.3–1.2)
Total Protein: 4.9 g/dL — ABNORMAL LOW (ref 6.5–8.1)

## 2022-11-02 LAB — GLUCOSE, CAPILLARY
Glucose-Capillary: 146 mg/dL — ABNORMAL HIGH (ref 70–99)
Glucose-Capillary: 142 mg/dL — ABNORMAL HIGH (ref 70–99)
Glucose-Capillary: 241 mg/dL — ABNORMAL HIGH (ref 70–99)
Glucose-Capillary: 145 mg/dL — ABNORMAL HIGH (ref 70–99)

## 2022-11-02 LAB — PROTIME-INR
INR: 1.8 — ABNORMAL HIGH (ref 0.8–1.2)
Prothrombin Time: 21.3 seconds — ABNORMAL HIGH (ref 11.4–15.2)

## 2022-11-02 LAB — PHOSPHORUS: Phosphorus: 3.2 mg/dL (ref 2.5–4.6)

## 2022-11-02 LAB — MAGNESIUM: Magnesium: 1.9 mg/dL (ref 1.7–2.4)

## 2022-11-02 MED ORDER — WARFARIN SODIUM 4 MG PO TABS
4.0000 mg | ORAL_TABLET | Freq: Once | ORAL | Status: AC
  Administered 2022-11-02: 4 mg via ORAL
  Filled 2022-11-02: qty 1

## 2022-11-02 MED ORDER — POLYETHYLENE GLYCOL 3350 17 G PO PACK: 17.0000 g | PACK | Freq: Two times a day (BID) | ORAL | 0 refills | Status: AC

## 2022-11-02 MED ORDER — NEOMYCIN-POLYMYXIN-DEXAMETH 3.5-10000-0.1 OP SUSP: 1.0000 [drp] | Freq: Two times a day (BID) | OPHTHALMIC | 0 refills | Status: AC

## 2022-11-02 MED ORDER — ADULT MULTIVITAMIN W/MINERALS CH: 1.0000 | ORAL_TABLET | Freq: Every day | ORAL | 0 refills | Status: AC

## 2022-11-02 MED ORDER — SENNOSIDES-DOCUSATE SODIUM 8.6-50 MG PO TABS: 1.0000 | ORAL_TABLET | Freq: Two times a day (BID) | ORAL | 0 refills | Status: AC

## 2022-11-02 MED ORDER — OXYCODONE HCL 5 MG PO TABS: 2.5000 mg | ORAL_TABLET | Freq: Four times a day (QID) | ORAL | 0 refills | Status: DC | PRN

## 2022-11-02 MED ORDER — QUETIAPINE FUMARATE 25 MG PO TABS: 12.5000 mg | ORAL_TABLET | Freq: Every day | ORAL | 0 refills | Status: AC

## 2022-11-02 NOTE — Progress Notes (Signed)
ANTICOAGULATION CONSULT NOTE  Pharmacy Consult for Warfarin Indication: atrial fibrillation  Allergies  Allergen Reactions   Codeine Anaphylaxis   Penicillins Anaphylaxis   Clonidine Derivatives Other (See Comments)    Unknown reaction   Cozaar [Losartan] Other (See Comments)    Myalgias    Lipitor [Atorvastatin] Other (See Comments)    Unknown reaction   Ms Contin [Morphine] Other (See Comments)    Unknown reaction   Neurontin [Gabapentin] Nausea Only   Reglan [Metoclopramide] Other (See Comments)    Tremors    Valtrex [Valacyclovir] Other (See Comments)    Tremors    Aldactone [Spironolactone] Itching   Apresoline [Hydralazine] Anxiety   Norvasc [Amlodipine] Itching    Does cause itching. Pt does take PRN if needed.    Patient Measurements: Height: 5' 5.98" (167.6 cm) Weight: 54.4 kg (119 lb 14.9 oz) IBW/kg (Calculated) : 59.26  Vital Signs: Temp: 97.7 F (36.5 C) (05/20 1148) Temp Source: Oral (05/20 1148) BP: 141/59 (05/20 1148) Pulse Rate: 68 (05/20 1148)  Labs: Recent Labs    10/31/22 0416 11/01/22 0411 11/02/22 0325  HGB 10.3* 11.6* 10.2*  HCT 31.9* 35.3* 31.5*  PLT 252 300 291  LABPROT 24.4* 21.5* 21.3*  INR 2.2* 1.8* 1.8*  CREATININE 0.43* 0.55 0.52     Estimated Creatinine Clearance: 36.9 mL/min (by C-G formula based on SCr of 0.52 mg/dL).   Medical History: Past Medical History:  Diagnosis Date   Atrial fibrillation (HCC) 2012   Breast cancer (HCC) 04/2018   IDC of left breast    CHF (congestive heart failure) (HCC)    Colon polyp 2003   Compression fracture of L2 lumbar vertebra (HCC)    Coronary artery disease    COVID-19 virus infection 12/25/2019   Diabetes mellitus without complication (HCC)    Dyspnea    Dysrhythmia    atrial fib   Environmental and seasonal allergies    GERD (gastroesophageal reflux disease)    Hyperlipidemia    Hypertension    Hypertensive urgency 03/10/2021   Irritable bowel syndrome    Multiple rib  fractures 03/15/2018   Personal history of radiation therapy    Polyp of colon    PONV (postoperative nausea and vomiting)    with ether, not recently "woke up in surgery once"   Presence of permanent cardiac pacemaker    Tremor    Tremors of nervous system    Ulcer     Medications:  Medications Prior to Admission  Medication Sig Dispense Refill Last Dose   acetaminophen (TYLENOL) 500 MG tablet Take 1,000 mg by mouth every 6 (six) hours as needed for moderate pain, fever or headache.   10/23/2022   amLODipine (NORVASC) 10 MG tablet Take 1 tablet (10 mg total) by mouth daily. (Patient taking differently: Take 10 mg by mouth daily as needed (sBP > 140 after all medications have been taken).) 30 tablet 0 Past Week   atenolol (TENORMIN) 25 MG tablet Take 25 mg by mouth 2 (two) times daily.   10/23/2022 at 0845   hydrochlorothiazide (HYDRODIURIL) 25 MG tablet Take 25 mg by mouth every other day.   Past Week   isosorbide mononitrate (IMDUR) 60 MG 24 hr tablet Take 60 mg by mouth 2 (two) times daily.   10/23/2022   lisinopril (ZESTRIL) 40 MG tablet Take 1 tablet (40 mg total) by mouth 2 (two) times daily. 180 tablet 0 10/23/2022   meclizine (ANTIVERT) 25 MG tablet Take 1 tablet by mouth three times daily  as needed (Patient taking differently: Take 25 mg by mouth 3 (three) times daily as needed for dizziness.) 40 tablet 5 Past Month   [EXPIRED] neomycin-polymyxin b-dexamethasone (MAXITROL) 3.5-10000-0.1 SUSP Place 1 drop into both eyes 2 (two) times daily. 10 day course.   10/23/2022   nitroGLYCERIN (NITROSTAT) 0.4 MG SL tablet Place 0.4 mg under the tongue every 5 (five) minutes as needed for chest pain.   UNK   Polyethyl Glycol-Propyl Glycol (SYSTANE OP) Place 1 drop into both eyes 4 (four) times daily as needed (dry eyes).   10/23/2022   warfarin (COUMADIN) 2 MG tablet Take 2-4 mg by mouth See admin instructions. 2 mg every Sunday and Wednesday night at 2000. Take 4 mg at 2000 on all other days.    10/22/2022 at 2000   glucose blood (ONE TOUCH ULTRA TEST) test strip USE 1 STRIP TO CHECK GLUCOSE TWICE DAILY  Dx code: E11.9 200 each 3    glucose blood (ONE TOUCH ULTRA TEST) test strip USE ONE STRIP TO CHECK GLUCOSE ONCE  DAILY 100 each 3    glucose blood test strip Use to check blood sugars once daily. 100 each 12    Lancets (ONETOUCH DELICA PLUS LANCET33G) MISC Place 2 Devices inside cheek daily. DX Code E11.9. 100 each 2     Assessment: 89 yo F on warfarin PTA for afib.  PTA dose is warfarin 4mg  daily except 2mg  on Sun and Weds.  Warfarin was held for 48 hours around the timing of ortho surgery and again for MRI to r/o CVA.  Per Dr. Marland Mcalpine, ok to resume anticoagulation.  Asked pharmacy team to check copay for DOAC for possible medication change.  No prescription medication coverage found.  Perhaps cost has been a barrier to switching to DOAC in the past.  5/20: INR subtherapeutic at 1.8. Warfarin dose held 5/18 PM due to concern for SBO. Now resolving and advancing diet. CBC OK/stable.   Goal of Therapy:  INR 2-3 Monitor platelets by anticoagulation protocol: Yes   Plan:  Warfarin 4 mg PO x 1 Daily INR  [employer], Pharm.D., BCPS Clinical Pharmacist Clinical phone for 11/02/2022 from 7:30-3:00 is [phone].  **Pharmacist phone directory can be found on [email or web address] listed under [facility].  11/02/2022 12:10 PM

## 2022-11-02 NOTE — Discharge Summary (Signed)
Physician Discharge Summary   Patient: Leah Holmes MRN: [id number] DOB: Nov 11, 1926  Admit date:     10/23/2022  Discharge date: 11/02/22  Discharge Physician: Marguerita Merles, DO   PCP: Sherlene Shams, MD   Recommendations at discharge:   Follow-up with PCP within 1 to 2 weeks and repeat CBC, CMP, mag, Phos within 1 week; REPEAT PT-INR in the AM and continue to Adjust Warfarin given slightly subtherapeutic INR Follow-up with Cardiology in outpatient setting  Follow-up with Orthopedic Surgery within 1 to 2 weeks  Discharge Diagnoses: Principal Problem:   Intractable headache Active Problems:   Hallucination, visual   Essential hypertension, benign   Other malaise   CAD (coronary artery disease)   DM type 2 with diabetic peripheral neuropathy (HCC)   Chronic atrial fibrillation (HCC)   Sick sinus syndrome (HCC)   Hyperlipidemia associated with type 2 diabetes mellitus (HCC)   Aortic stenosis   Pacemaker   Hip fracture (HCC)   Protein-calorie malnutrition, severe  Resolved Problems:   Generalized anxiety disorder   Major depressive disorder, recurrent episode [facility])  Hospital Course: The patient is a 89 year old with a history of DM2, CAD, chronic atrial fibrillation on warfarin, HTN, HLD, anxiety/depression, remote treatment for breast cancer, and SSS status post pacemaker placement who presented to the [place] ER 5/10 with a 1 week history of HA and hallucinations. She had been evaluated by an Ophthalmologist in the outpatient setting who found no acute issues. She was transferred to [facility] ER from [facility] to undergo an MRI. CT head at presentation revealed no acute findings. CXR was unrevealing. Head CT x 2 showed no acute findings carotid Dopplers without significant stenosis bilaterally. Subsequently she had mechanical fall in the hospital which resulted in a comminuted displaced intertrochanteric fracture of the right femur and she underwent intramedullary nailing on  10/25/2022. Patient is having trouble with swallowing pills so SLP will be consulted and PT OT recommending SNF.    Patient was having abdominal distention and there is concern for small bowel obstruction so we will obtain a CT scan of the abdomen pelvis with oral and IV contrast.  After further evaluation it was more likely an ileus and CT scan shows no evidence of obstruction.  She is not having a large bowel movement and improving.  She is now stable for discharge and will need to follow-up with PCP, orthopedic surgery and cardiology in outpatient setting.  Assessment and Plan:  Mechanical fall in the hospital with resultant comminuted displaced intertrochanteric fracture of the right femur S/p intramedullary nailing 5/12 -Postoperative care per Orthopedic Surgery  -Warfarin resumed yesterday, with INR notably still within therapeutic range however will hold given ? Valve Vegetation -Continue with bowel regimen -Orthopedic surgery recommending advancing diet up with therapy and also recommending partial weightbearing in the right lower extremity with follow-up in 2 weeks recommending continue to keep the dressings in place unless soiled -PT OT recommending SNF and has a bed available and stable for D/C   Persisting HA with visual hallucinations, improved -Has been evaluated by Neurology - EEG unrevealing - CT head x2 w/o acute findings  -Carotid Dopplers without evidence of significant stenosis bilaterally  -No further inpatient w/u is felt to be indicated and MRI was thought to be low yield in setting of non-focal sx w/ 2 negative CT  -Neuro opined this may be the initial onset of Lewy body dementia but after discussion with Neuro again will get MRI formally given ? Valve Vegetation -MRI  and MRA done and showed "Brain MRI: Involuting brain without acute or reversible finding. MRA: No emergent finding. Atherosclerosis with moderate stenoses of the right M1 and left A2 segments." -Initiated a  trial of low dose seroquel as suggested by Neuro but given her advanced age was begun at a low dose nightly only  -Dose will be held each night if the patient is sedated during the day but if she tolerates it without significant difficulty we could be increased to twice daily dosing -Improved and stable   Abdominal Distention with concern for SBO but likely ileus, and this is improving -Examined patient a few days ago and patient was having abdominal discomfort and was more distended and hypertympanic to percussion.  On auscultation she had hyperactive bowel sounds -KUB was ordered and done and showed "Dilated loops of small bowel present. No definite free air is present the supine view. Moderate stool is present the ascending colon. Right hip ORIF noted." -Repeat KUB today done and showed "Gaseous distension of large and small bowel loops throughout the abdomen, which may reflect ileus or obstruction." -She is not having any nausea or vomiting so we will not place an NG tube and continue Diet now per Discussion with Gen Surgery -Consulted Gen Surgery and Case was discussed with general surgery Dr. Sophronia Simas who recommends obtaining a CT scan of the abdomen pelvis with oral and IV contrast a -CT Scan done and showed "No evidence for bowel obstruction. Mild circumferential wall thickening in the right and transverse colon extending into the splenic flexure. Sigmoid colon appears relatively spared. No substantial pericolonic edema or inflammation. Imaging features may reflect a component of underlying infectious/inflammatory colitis. Status post ORIF for comminuted right femoral neck fracture. There is edema in the soft tissues around the right hip with a small subcutaneous hematoma in this region measuring 2.5 x 1.8 cm. Compression fractures at L1 and L2. Small left and tiny right pleural effusions with bibasilar atelectasis. Cardiomegaly. Aortic Atherosclerosis." -We do not suspect infectious colitis  and she is no longer having abdominal pain or discomfort and has no fevers or leukocytosis.  Will need to continue monitor carefully in outpatient setting -If becomes symptomatic or having worsening abdominal pain or discomfort and has fevers and leukocytosis recommend treating with antibiotics we will hold off for now   Chronic Atrial Fibrillation on Warfarin -Rate is well-controlled  -Unclear why patient is not on DOAC instead of warfarin (despite extensive review of outpatient records); Family checking with Primary Cardiologist for pharmacy review she does not have prescription medication coverage found which is likely the reason because cost may have been a barrier to switching to DOAC in the past: The cardiologist here recommended considering switching to Eliquis 2.5 mg twice daily given her age and weight but for now we will resume the home Coumadin -Resume Warfarin with Pharmacy to dose -Continue monitor PT INR and was a little low and was 21.3/1.8 respectively; WILL NEED REPEAT PT-INR in the AM   Dysphagia -Obtain SLP evaluation and place on Soft Diet  -SLP evaluated and recommending continuing Dysphagia 3 diet with thin liquids for now but will held diet given concern for SBO but given bowel movements and improvement can resume Diet   Hyperbilirubinemia -Mild and likely reactive -T Bili Trend: Recent Labs  Lab 10/23/22 1339 10/28/22 0814 10/29/22 0455 10/30/22 0730 10/31/22 0416 11/01/22 0411 11/02/22 0325  BILITOT 0.8 1.1 1.0 1.1 0.9 1.5* 1.1  -Continue To monitor and trend and repeat CMP  within 1 week   Hypokalemia -Patient's K+ Level Trend: Recent Labs  Lab 10/27/22 0724 10/28/22 0814 10/29/22 0455 10/30/22 0730 10/31/22 0416 11/01/22 0411 11/02/22 0325  K 3.5 4.5 4.0 3.9 4.0 4.4 3.8  -Continue to Monitor and Replete as Necessary -Repeat CMP within 1 week    Poor Po Intake -Nutritionist Consulted and Initiated Feeding Supplements TID and see below    Hypophosphatemia -Phos Level Trend Recent Labs  Lab 10/28/22 0814 10/29/22 0455 10/30/22 0730 10/31/22 0416 11/01/22 0411 11/02/22 0325  PHOS 2.1* 2.6 2.2* 3.3 2.7 3.2  -Continue to Monitor and replete as Necessary -Repeat Phos Level within 1 week    Hypomagnesemia -Patient's Mag Level Trend: Recent Labs  Lab 10/27/22 0724 10/28/22 0814 10/29/22 0455 10/30/22 0730 10/31/22 0416 11/01/22 0411 11/02/22 0325  MG 2.1 1.9 1.8 1.8 2.0 1.9 1.9  -Continue to Monitor and Replete as Necessary -Repeat Mag with 1 week   HTN -Adjustments made in blood pressure regimen last night -BP at goal presently -avoid overaggressive correction given advanced age -Continue to Monitor BP per Protocol -Last BP reading was 141/59   CAD -Diffuse three-vessel CAD by cardiac cath 2012 with critical mid LAD stenosis not amenable to PCI -has declined follow-up testing as she would not wish to pursue intervention -Nno recent anginal symptoms with exertion   DM2 -A1c 6.9 09/07/2022  -CBG reasonably well-controlled presently -monitor trend without change in treatment today -CBG Trend:  Recent Labs  Lab 11/01/22 0920 11/01/22 1203 11/01/22 1620 11/01/22 2131 11/02/22 0617 11/02/22 0632 11/02/22 1149  GLUCAP 209* 197* 134* 126* 146* 142* 241*    SSS status post pacemaker insertion 2017 -Will need MRI Pacer Protocol and Cardiology evaluation given Moderate to Severe Aortic Stenosis -Cardiology evaluated and recommending outpatient monitoring   HLD -Continue usual medical therapy and resume    Aortic Stenosis -Graded as mild via TTE 2022 -Repeat ECHO this visit done and showed an EF of 60 to 65% with moderate left concentric hypertrophy and moderate to severe aortic stenosis present -Will formally consult cardiology for further evaluation recommendations and they reviewed the echo.  Dr. Bjorn Pippin feels that she will need outpatient monitoring for her aortic stenosis and they are going to  monitor as an outpatient   ?  Aortic valve vegetation, ruled out -TTE done and showed "Findings consistent with hypertrophic cardiomyopathy. Findings concerning for aortic valve vegetation, would recommend a Transesophageal Echocardiogram for clarification." -Consult cardiology formally Dr. Bjorn Pippin reviewed the echo and does not see a vegetation but feels that the aortic valve is heavily calcified -MRI as above -Currently blood cultures are showing no growth to date -Cardiology recommending checking blood cultures if negative no further workup is recommended; blood cultures have been ordered and cardiology feels if they are no growth at 48 hours then she can be discharged to SNF but now having abdominal distention and a possible small bowel obstruction; Blood Cx showing NGTD at 4 Days   Hypoalbuminemia -Patient's Albumin Trend: Recent Labs  Lab 10/23/22 1339 10/28/22 0814 10/29/22 0455 10/30/22 0730 10/31/22 0416 11/01/22 0411 11/02/22 0325  ALBUMIN 4.0 2.9* 2.9* 2.7* 2.3* 2.7* 2.3*  -Continue to Monitor and Trend and repeat CMP in the AM    Severe Protein Calorie Malnutrition Nutrition Status: Nutrition Problem: Severe Malnutrition (in the context of chronic illness) Etiology:  (inadequate energy intake) Signs/Symptoms: severe fat depletion, severe muscle depletion Interventions: Refer to RD note for recommendations Nutrition Documentation    Flowsheet Row ED to Hosp-Admission (Current)  from 10/23/2022 in [facility]  Nutrition Problem Severe Malnutrition  [in the context of chronic illness]  Etiology --  [inadequate energy intake]  Nutrition Goal Patient will meet greater than or equal to 90% of their needs  Interventions Refer to RD note for recommendations      Consultants: Orthopedic Surgery, Cardiology, Neurology, General Surgery  Procedures performed: As delineated as above  Disposition: Skilled nursing facility Diet recommendation:  Discharge Diet  Orders (From admission, onward)     Start     Ordered   11/02/22 0000  Diet - low sodium heart healthy        11/02/22 1426           DYSPHAGIA 3 Cardiac and Carb modified diet  DISCHARGE MEDICATION: Allergies as of 11/02/2022       Reactions   Codeine Anaphylaxis   Penicillins Anaphylaxis   Clonidine Derivatives Other (See Comments)   Unknown reaction   Cozaar [losartan] Other (See Comments)   Myalgias    Lipitor [atorvastatin] Other (See Comments)   Unknown reaction   Ms Contin [morphine] Other (See Comments)   Unknown reaction   Neurontin [gabapentin] Nausea Only   Reglan [metoclopramide] Other (See Comments)   Tremors    Valtrex [valacyclovir] Other (See Comments)   Tremors    Aldactone [spironolactone] Itching   Apresoline [hydralazine] Anxiety   Norvasc [amlodipine] Itching   Does cause itching. Pt does take PRN if needed.        Medication List     STOP taking these medications    hydrochlorothiazide 25 MG tablet Commonly known as: HYDRODIURIL       TAKE these medications    acetaminophen 500 MG tablet Commonly known as: TYLENOL Take 1,000 mg by mouth every 6 (six) hours as needed for moderate pain, fever or headache.   amLODipine 10 MG tablet Commonly known as: NORVASC Take 1 tablet (10 mg total) by mouth daily. What changed:  when to take this reasons to take this   atenolol 25 MG tablet Commonly known as: TENORMIN Take 25 mg by mouth 2 (two) times daily.   glucose blood test strip Commonly known as: ONE TOUCH ULTRA TEST USE 1 STRIP TO CHECK GLUCOSE TWICE DAILY  Dx code: E11.9 What changed: Another medication with the same name was removed. Continue taking this medication, and follow the directions you see here.   isosorbide mononitrate 60 MG 24 hr tablet Commonly known as: IMDUR Take 60 mg by mouth 2 (two) times daily.   lisinopril 40 MG tablet Commonly known as: ZESTRIL Take 1 tablet (40 mg total) by mouth 2 (two) times daily.    meclizine 25 MG tablet Commonly known as: ANTIVERT Take 1 tablet by mouth three times daily as needed What changed: reasons to take this   multivitamin with minerals Tabs tablet Take 1 tablet by mouth daily. Start taking on: Nov 03, 2022   neomycin-polymyxin b-dexamethasone 3.5-10000-0.1 Susp Commonly known as: MAXITROL Place 1 drop into both eyes 2 (two) times daily for 1 day. What changed: additional instructions   nitroGLYCERIN 0.4 MG SL tablet Commonly known as: NITROSTAT Place 0.4 mg under the tongue every 5 (five) minutes as needed for chest pain.   OneTouch Delica Plus Lancet33G Misc Place 2 Devices inside cheek daily. DX Code E11.9.   oxyCODONE 5 MG immediate release tablet Commonly known as: Oxy IR/ROXICODONE Take 0.5 tablets (2.5 mg total) by mouth every 6 (six) hours as needed for moderate  pain or severe pain.   polyethylene glycol 17 g packet Commonly known as: MIRALAX / GLYCOLAX Take 17 g by mouth 2 (two) times daily.   QUEtiapine 25 MG tablet Commonly known as: SEROQUEL Take 0.5 tablets (12.5 mg total) by mouth at bedtime.   senna-docusate 8.6-50 MG tablet Commonly known as: Senokot-S Take 1 tablet by mouth 2 (two) times daily.   SYSTANE OP Place 1 drop into both eyes 4 (four) times daily as needed (dry eyes).   warfarin 2 MG tablet Commonly known as: COUMADIN Take 2-4 mg by mouth See admin instructions. 2 mg every Sunday and Wednesday night at 2000. Take 4 mg at 2000 on all other days.        Follow-up Information     Durene Romans, MD. Schedule an appointment as soon as possible for a visit in 2 week(s).   Specialty: Orthopedic Surgery Contact information: [address] [place] [address] [place] [address] [phone]                Discharge Exam: [facility] Weights   10/23/22 2329 10/25/22 1443  Weight: 54.4 kg 54.4 kg   Today's Vitals   11/02/22 0805 11/02/22 0950 11/02/22 1035 11/02/22 1148  BP: (!) 158/62   (!) 141/59  Pulse:  71   68  Resp: 18   18  Temp: (!) 97.5 F (36.4 C)   97.7 F (36.5 C)  TempSrc: Oral   Oral  SpO2: 98%   97%  Weight:      Height:      PainSc:  3  0-No pain    Body mass index is 19.37 kg/m.  Examination: Physical Exam:  Constitutional: Elderly chronically ill-appearing Caucasian female who appears calm and comfortable in no acute distress Respiratory: Diminished to auscultation bilaterally, no wheezing, rales, rhonchi or crackles. Normal respiratory effort and patient is not tachypenic. No accessory muscle use.  Unlabored breathing Cardiovascular: Irregularly irregular.  Has a 3 out of 6 systolic murmur.  No appreciable extremity edema Abdomen: Soft, non-tender, non-distended. Bowel sounds positive.  GU: Deferred. Musculoskeletal: No clubbing / cyanosis of digits/nails. No joint deformity upper and lower extremities.  Skin: Has a bruise on her right hip. No induration; Warm and dry.  Neurologic: CN 2-12 grossly intact with no focal deficits. Romberg sign and cerebellar reflexes not assessed.  Psychiatric: Normal judgment and insight. Alert and oriented x 3. Normal mood and appropriate affect.   Condition at discharge: stable  The results of significant diagnostics from this hospitalization (including imaging, microbiology, ancillary and laboratory) are listed below for reference.   Imaging Studies: CT ABDOMEN PELVIS W CONTRAST  Result Date: 11/02/2022 CLINICAL DATA:  Abdominal pain and distention after hip surgery. EXAM: CT ABDOMEN AND PELVIS WITH CONTRAST TECHNIQUE: Multidetector CT imaging of the abdomen and pelvis was performed using the standard protocol following bolus administration of intravenous contrast. RADIATION DOSE REDUCTION: This exam was performed according to the departmental dose-optimization program which includes automated exposure control, adjustment of the mA and/or kV according to patient size and/or use of iterative reconstruction technique. CONTRAST:  75mL  OMNIPAQUE IOHEXOL 350 MG/ML SOLN COMPARISON:  03/10/2021 FINDINGS: Lower chest: Cardiomegaly. Bibasilar atelectasis with small left and tiny right pleural effusions. Hepatobiliary: No suspicious focal abnormality within the liver parenchyma. Cholecystectomy. No intrahepatic or extrahepatic biliary dilation. Pancreas: No focal mass lesion. No dilatation of the main duct. No intraparenchymal cyst. No peripancreatic edema. Spleen: No splenomegaly. No focal mass lesion. Adrenals/Urinary Tract: No adrenal nodule or  mass. Central sinus cysts noted bilaterally. No followup imaging is recommended. No evidence for hydroureter. The urinary bladder appears normal for the degree of distention. Stomach/Bowel: Stomach is unremarkable. No gastric wall thickening. No evidence of outlet obstruction. Duodenum is normally positioned as is the ligament of Treitz. No small bowel wall thickening. No small bowel dilatation. The terminal ileum is normal. The appendix is not well visualized, but there is no edema or inflammation in the region of the cecum. Mild circumferential wall thickening noted in the right and transverse colon extending into the splenic flexure. Sigmoid colon appears relatively spared. No substantial pericolonic edema or inflammation. Vascular/Lymphatic: There is advanced atherosclerotic calcification of the abdominal aorta without aneurysm. There is no gastrohepatic or hepatoduodenal ligament lymphadenopathy. No retroperitoneal or mesenteric lymphadenopathy. No pelvic sidewall lymphadenopathy. Reproductive: Hysterectomy.  There is no adnexal mass. Other: Trace free fluid is seen in the pelvis. Musculoskeletal: Status post ORIF for comminuted right femoral neck fracture. There is edema in the soft tissues around the right hip with a small subcutaneous hematoma in this region measuring 2.5 x 1.8 cm. Bones are diffusely demineralized. Compression fractures noted at L1 and L2. IMPRESSION: 1. No evidence for bowel  obstruction. 2. Mild circumferential wall thickening in the right and transverse colon extending into the splenic flexure. Sigmoid colon appears relatively spared. No substantial pericolonic edema or inflammation. Imaging features may reflect a component of underlying infectious/inflammatory colitis. 3. Status post ORIF for comminuted right femoral neck fracture. There is edema in the soft tissues around the right hip with a small subcutaneous hematoma in this region measuring 2.5 x 1.8 cm. 4. Compression fractures at L1 and L2. 5. Small left and tiny right pleural effusions with bibasilar atelectasis. 6. Cardiomegaly. 7.  Aortic Atherosclerosis (ICD10-I70.0). Electronically Signed   By: Kennith Center M.D.   On: 11/02/2022 07:53   DG Abd 1 View  Result Date: 11/01/2022 CLINICAL DATA:  Abdominal distension EXAM: ABDOMEN - 1 VIEW COMPARISON:  10/31/2022 FINDINGS: Gaseous distension of large and small bowel loops throughout the abdomen. Prominent amount of stool in the right colon. No gross free intraperitoneal air. Diffuse osseous demineralization. Prior right hip ORIF. IMPRESSION: Gaseous distension of large and small bowel loops throughout the abdomen, which may reflect ileus or obstruction. Electronically Signed   By: Duanne Guess D.O.   On: 11/01/2022 09:29   DG Abd 1 View  Result Date: 10/31/2022 CLINICAL DATA:  Abdominal distension.  Abdominal discomfort. EXAM: ABDOMEN - 1 VIEW COMPARISON:  Pelvis 10/25/2022. FINDINGS: Dilated loops of small bowel present. No definite free air is present the supine view. Moderate stool is present the ascending colon. Right hip ORIF noted. IMPRESSION: Dilated loops of small bowel compatible with small bowel obstruction. Electronically Signed   By: Marin Roberts M.D.   On: 10/31/2022 14:27   MR BRAIN WO CONTRAST  Result Date: 10/29/2022 CLINICAL DATA:  Neuro deficit with acute stroke suspected EXAM: MRI HEAD WITHOUT CONTRAST MRA HEAD WITHOUT CONTRAST  TECHNIQUE: Multiplanar, multi-echo pulse sequences of the brain and surrounding structures were acquired without intravenous contrast. Angiographic images of the Circle of Willis were acquired using MRA technique without intravenous contrast. COMPARISON:  Head CT from 5 days ago FINDINGS: MRI HEAD FINDINGS Brain: No acute infarction, hemorrhage, hydrocephalus, extra-axial collection or mass lesion. Generalized atrophy. Fairly extensive chronic small vessel ischemia cerebral white matter with chronic lacune at the right caudate head. Vascular: Normal flow voids. Skull and upper cervical spine: Normal marrow signal. Sinuses/Orbits: No acute  or significant finding. MRA HEAD FINDINGS Anterior circulation: Atheromatous irregularity of the carotid siphons with moderate stenosis at the right M1 segment. Additional moderate narrowing at the left A2 segment. No branch occlusion, beading, or aneurysm. Posterior circulation: Vertebral and basilar arteries are smoothly contoured and widely patent. Mild atheromatous irregularity of the medium size branches. Negative for aneurysm. No vascular malformation. IMPRESSION: Brain MRI: Involuting brain without acute or reversible finding. MRA: 1. No emergent finding. 2. Atherosclerosis with moderate stenoses of the right M1 and left A2 segments. Electronically Signed   By: Tiburcio Pea M.D.   On: 10/29/2022 16:04   MR ANGIO HEAD WO CONTRAST  Result Date: 10/29/2022 CLINICAL DATA:  Neuro deficit with acute stroke suspected EXAM: MRI HEAD WITHOUT CONTRAST MRA HEAD WITHOUT CONTRAST TECHNIQUE: Multiplanar, multi-echo pulse sequences of the brain and surrounding structures were acquired without intravenous contrast. Angiographic images of the Circle of Willis were acquired using MRA technique without intravenous contrast. COMPARISON:  Head CT from 5 days ago FINDINGS: MRI HEAD FINDINGS Brain: No acute infarction, hemorrhage, hydrocephalus, extra-axial collection or mass lesion.  Generalized atrophy. Fairly extensive chronic small vessel ischemia cerebral white matter with chronic lacune at the right caudate head. Vascular: Normal flow voids. Skull and upper cervical spine: Normal marrow signal. Sinuses/Orbits: No acute or significant finding. MRA HEAD FINDINGS Anterior circulation: Atheromatous irregularity of the carotid siphons with moderate stenosis at the right M1 segment. Additional moderate narrowing at the left A2 segment. No branch occlusion, beading, or aneurysm. Posterior circulation: Vertebral and basilar arteries are smoothly contoured and widely patent. Mild atheromatous irregularity of the medium size branches. Negative for aneurysm. No vascular malformation. IMPRESSION: Brain MRI: Involuting brain without acute or reversible finding. MRA: 1. No emergent finding. 2. Atherosclerosis with moderate stenoses of the right M1 and left A2 segments. Electronically Signed   By: Tiburcio Pea M.D.   On: 10/29/2022 16:04   ECHOCARDIOGRAM COMPLETE  Result Date: 10/26/2022    ECHOCARDIOGRAM REPORT   Patient Name:   MONQUIE SCHMEISER Bagg Date of Exam: 10/26/2022 Medical Rec #:  [id number]               Height:       66.0 in Accession #:    [id number]              Weight:       119.9 lb Date of Birth:  1927-02-15               BSA:          1.609 m Patient Age:    89 years                BP:           141/82 mmHg Patient Gender: F                       HR:           55 bpm. Exam Location:  Inpatient Procedure: 2D Echo, Color Doppler and Cardiac Doppler Indications:     R01.1 Murmur  History:         Patient has prior history of Echocardiogram examinations, most                  recent 06/11/2021. CAD, Abnormal ECG and Pacemaker, Aortic                  Valve Disease, Arrythmias:Atrial Fibrillation,  Signs/Symptoms:Alzheimer's and Altered Mental Status; Risk                  Factors:Hypertension, Diabetes and Dyslipidemia. Breast cancer.                  Aortic stenosis.   Sonographer:     Sheralyn Boatman RDCS Referring Phys:  [id number] Orland Mustard Diagnosing Phys: Orpah Cobb MD  Sonographer Comments: Technically difficult study due to poor echo windows. Patient supine, post hip surgery. Could not turn. IMPRESSIONS  1. Left ventricular ejection fraction, by estimation, is 60 to 65%. The left ventricle has normal function. The left ventricle has no regional wall motion abnormalities. There is moderate concentric left ventricular hypertrophy. Left ventricular diastolic parameters are indeterminate.  2. Right ventricular systolic function is normal. The right ventricular size is normal. There is moderately elevated pulmonary artery systolic pressure.  3. Left atrial size was moderately dilated.  4. Right atrial size was moderately dilated.  5. The mitral valve is degenerative. Moderate to severe mitral valve regurgitation. Moderate mitral stenosis. Moderate to severe mitral annular calcification.  6. Tricuspid valve regurgitation is moderate to severe.  7. Cannot exclude vegetation on AV. The aortic valve is tricuspid. There is severe calcifcation of the aortic valve. There is severe thickening of the aortic valve. Aortic valve regurgitation is trivial. Moderate to severe aortic valve stenosis.  8. There is mild (Grade II) atheroma plaque involving the ascending aorta.  9. The inferior vena cava is normal in size with <50% respiratory variability, suggesting right atrial pressure of 8 mmHg. Conclusion(s)/Recommendation(s): Findings consistent with hypertrophic cardiomyopathy. Findings concerning for aortic valve vegetation, would recommend a Transesophageal Echocardiogram for clarification. FINDINGS  Left Ventricle: Left ventricular ejection fraction, by estimation, is 60 to 65%. The left ventricle has normal function. The left ventricle has no regional wall motion abnormalities. The left ventricular internal cavity size was normal in size. There is  moderate concentric left ventricular  hypertrophy. Left ventricular diastolic parameters are indeterminate. Right Ventricle: The right ventricular size is normal. No increase in right ventricular wall thickness. Right ventricular systolic function is normal. There is moderately elevated pulmonary artery systolic pressure. The tricuspid regurgitant velocity is 3.29 m/s, and with an assumed right atrial pressure of 15 mmHg, the estimated right ventricular systolic pressure is 58.3 mmHg. Left Atrium: Left atrial size was moderately dilated. Right Atrium: Right atrial size was moderately dilated. Pericardium: There is no evidence of pericardial effusion. Mitral Valve: The mitral valve is degenerative in appearance. There is moderate thickening of the mitral valve leaflet(s). There is severe calcification of the posterior mitral valve leaflet(s). Moderately decreased mobility of the mitral valve leaflets.  Moderate to severe mitral annular calcification. Moderate to severe mitral valve regurgitation. Moderate mitral valve stenosis. MV peak gradient, 9.1 mmHg. The mean mitral valve gradient is 2.0 mmHg. Tricuspid Valve: The tricuspid valve is normal in structure. Tricuspid valve regurgitation is moderate to severe. Aortic Valve: Cannot exclude vegetation on AV. The aortic valve is tricuspid. There is severe calcifcation of the aortic valve. There is severe thickening of the aortic valve. There is moderate to severe aortic valve annular calcification. Aortic valve regurgitation is trivial. Moderate to severe aortic stenosis is present. Aortic valve mean gradient measures 19.6 mmHg. Aortic valve peak gradient measures 34.1 mmHg. Pulmonic Valve: The pulmonic valve was normal in structure. Pulmonic valve regurgitation is mild. Aorta: The aortic root is normal in size and structure. There is mild (Grade II) atheroma plaque involving  the ascending aorta. Venous: The inferior vena cava is normal in size with less than 50% respiratory variability, suggesting right  atrial pressure of 8 mmHg. IAS/Shunts: The atrial septum is grossly normal. Additional Comments: A device lead is visualized in the right atrium and right ventricle.  LEFT VENTRICLE PLAX 2D LVIDd:         3.00 cm     Diastology LVIDs:         2.00 cm     LV e' medial:    5.63 cm/s LV PW:         1.70 cm     LV E/e' medial:  24.9 LV IVS:        1.80 cm     LV e' lateral:   12.70 cm/s                            LV E/e' lateral: 11.0  LV Volumes (MOD) LV vol d, MOD A2C: 53.3 ml LV vol d, MOD A4C: 46.6 ml LV vol s, MOD A2C: 17.8 ml LV vol s, MOD A4C: 18.6 ml LV SV MOD A2C:     35.5 ml LV SV MOD A4C:     46.6 ml LV SV MOD BP:      31.3 ml RIGHT VENTRICLE            IVC RV S prime:     7.62 cm/s  IVC diam: 2.00 cm TAPSE (M-mode): 1.1 cm LEFT ATRIUM             Index        RIGHT ATRIUM           Index LA diam:        4.20 cm 2.61 cm/m   RA Area:     19.40 cm LA Vol (A2C):   50.4 ml 31.32 ml/m  RA Volume:   53.20 ml  33.06 ml/m LA Vol (A4C):   40.2 ml 24.98 ml/m LA Biplane Vol: 46.5 ml 28.90 ml/m  AORTIC VALVE                    PULMONIC VALVE AV Vmax:           291.80 cm/s  PV Vmax:          3.31 m/s AV Vmean:          206.600 cm/s PV Peak grad:     43.8 mmHg AV VTI:            0.657 m      PR End Diast Vel: 1.44 msec AV Peak Grad:      34.1 mmHg AV Mean Grad:      19.6 mmHg LVOT Vmax:         135.00 cm/s LVOT Vmean:        91.200 cm/s LVOT VTI:          0.243 m LVOT/AV VTI ratio: 0.37  AORTA Ao Root diam: 3.10 cm Ao Asc diam:  3.10 cm MITRAL VALVE                TRICUSPID VALVE MV Area (PHT): 2.95 cm     TR Peak grad:   43.3 mmHg MV Peak grad:  9.1 mmHg     TR Vmax:        329.00 cm/s MV Mean grad:  2.0 mmHg MV Vmax:       1.50 m/s  SHUNTS MV Vmean:      66.6 cm/s    Systemic VTI: 0.24 m MV Decel Time: 257 msec MV E velocity: 140.00 cm/s Orpah Cobb MD Electronically signed by Orpah Cobb MD Signature Date/Time: 10/26/2022/3:46:01 PM    Final    VAS US CAROTID  Result Date: 10/26/2022 Carotid Arterial  Duplex Study Patient Name:  TRANISHA HENNES Harmon  Date of Exam:   10/25/2022 Medical Rec #: [id number]                Accession #:    [id number] Date of Birth: 1927/04/18                Patient Gender: F Patient Age:   69 years Exam Location:  [facility] Procedure:      VAS US CAROTID Referring Phys: Orland Mustard --------------------------------------------------------------------------------  Indications:       Carotid stenosis-bilateral I65.23. Risk Factors:      Hypertension, hyperlipidemia, Diabetes, coronary artery                    disease. Limitations        Today's exam was limited due to the patient's respiratory                    variation and patient movement. Comparison Study:  No prior studies. Performing Technologist: Chanda Busing RVT  Examination Guidelines: A complete evaluation includes B-mode imaging, spectral Doppler, color Doppler, and power Doppler as needed of all accessible portions of each vessel. Bilateral testing is considered an integral part of a complete examination. Limited examinations for reoccurring indications may be performed as noted.  Right Carotid Findings: +----------+--------+--------+--------+-----------------------+--------+           PSV cm/sEDV cm/sStenosisPlaque Description     Comments +----------+--------+--------+--------+-----------------------+--------+ CCA Prox  50      5               smooth and heterogenous         +----------+--------+--------+--------+-----------------------+--------+ CCA Distal46      6               smooth and heterogenous         +----------+--------+--------+--------+-----------------------+--------+ ICA Prox  93      14              irregular and calcific          +----------+--------+--------+--------+-----------------------+--------+ ICA Mid   94      9               heterogenous                    +----------+--------+--------+--------+-----------------------+--------+ ICA Distal66       12                                     tortuous +----------+--------+--------+--------+-----------------------+--------+ ECA       72      6                                               +----------+--------+--------+--------+-----------------------+--------+ +----------+--------+-------+--------+-------------------+           PSV cm/sEDV cmsDescribeArm Pressure (mmHG) +----------+--------+-------+--------+-------------------+ Subclavian102                                        +----------+--------+-------+--------+-------------------+ +---------+--------+--+--------+-+---------+  VertebralPSV cm/s64EDV cm/s8Antegrade +---------+--------+--+--------+-+---------+  Left Carotid Findings: +----------+--------+--------+--------+-----------------------+--------+           PSV cm/sEDV cm/sStenosisPlaque Description     Comments +----------+--------+--------+--------+-----------------------+--------+ CCA Prox  58      5               smooth and heterogenous         +----------+--------+--------+--------+-----------------------+--------+ CCA Distal53      7               smooth and heterogenous         +----------+--------+--------+--------+-----------------------+--------+ ICA Prox  94      16              calcific                        +----------+--------+--------+--------+-----------------------+--------+ ICA Mid   77      15              heterogenous           tortuous +----------+--------+--------+--------+-----------------------+--------+ ICA Distal45      8                                      tortuous +----------+--------+--------+--------+-----------------------+--------+ ECA       78      1                                               +----------+--------+--------+--------+-----------------------+--------+ +----------+--------+--------+--------+-------------------+           PSV cm/sEDV cm/sDescribeArm Pressure (mmHG)  +----------+--------+--------+--------+-------------------+ Subclavian113                                         +----------+--------+--------+--------+-------------------+ +---------+--------+--+--------+-+---------+ VertebralPSV cm/s54EDV cm/s8Antegrade +---------+--------+--+--------+-+---------+   Summary: Right Carotid: Velocities in the right ICA are consistent with a 1-39% stenosis. Left Carotid: Velocities in the left ICA are consistent with a 1-39% stenosis. Vertebrals: Bilateral vertebral arteries demonstrate antegrade flow. *See table(s) above for measurements and observations.  Electronically signed by Sherald Hess MD on 10/26/2022 at 1:40:28 PM.    Final    DG Pelvis Portable  Result Date: 10/25/2022 CLINICAL DATA:  Postop. EXAM: PORTABLE PELVIS 1-2 VIEWS COMPARISON:  Preoperative imaging. FINDINGS: Intramedullary nail with trans trochanteric and distal locking screw fixation traverse comminuted intertrochanteric femur fracture. Improved fracture alignment from preoperative imaging with mild residual displacement of the lesser trochanteric fragment. Recent postsurgical change includes air and edema in the soft tissues. IMPRESSION: ORIF of comminuted intertrochanteric femur fracture. No immediate postoperative complication. Electronically Signed   By: Narda Rutherford M.D.   On: 10/25/2022 17:20   DG HIP UNILAT WITH PELVIS 2-3 VIEWS RIGHT  Result Date: 10/25/2022 CLINICAL DATA:  Elective surgery. EXAM: DG HIP (WITH OR WITHOUT PELVIS) 2-3V RIGHT COMPARISON:  Preoperative imaging. FINDINGS: Three fluoroscopic spot views of the pelvis and right hip obtained in the operating room. Interval short intramedullary nail with trans trochanteric and distal locking screw fixation of intertrochanteric femur fracture. Fluoroscopy time 1 minutes 17 seconds. Dose 10.2 mGy. IMPRESSION: Intraoperative fluoroscopy for intertrochanteric femur fracture ORIF. Electronically Signed   By: Narda Rutherford M.D.   On:  10/25/2022 17:19   DG C-Arm 1-60 Min-No Report  Result Date: 10/25/2022 Fluoroscopy was utilized by the requesting physician.  No radiographic interpretation.   DG HIP UNILAT WITH PELVIS 2-3 VIEWS RIGHT  Result Date: 10/25/2022 CLINICAL DATA:  Larey Seat this morning EXAM: DG HIP (WITH OR WITHOUT PELVIS) 2-3V RIGHT COMPARISON:  None Available. FINDINGS: There is a comminuted displaced intertrochanteric fracture with no dislocation identified. No other abnormalities. IMPRESSION: Comminuted displaced intertrochanteric fracture. These results will be called to the ordering clinician or representative by the Radiologist Assistant, and communication documented in the PACS or [employer]. Electronically Signed   By: Gerome Sam III M.D.   On: 10/25/2022 10:45   EEG adult  Result Date: 10/24/2022 Jefferson Fuel, MD     10/24/2022  8:13 PM Routine EEG Report Liza Derington is a 89 y.o. female with a history of hallucinations who is undergoing an EEG to evaluate for seizures. Report: This EEG was acquired with electrodes placed according to the International 10-20 electrode system (including Fp1, Fp2, F3, F4, C3, C4, P3, P4, O1, O2, T3, T4, T5, T6, A1, A2, Fz, Cz, Pz). The following electrodes were missing or displaced: none. The occipital dominant rhythm was 8.5 Hz. This activity is reactive to stimulation. Drowsiness was manifested by background fragmentation; deeper stages of sleep were not identified. There was no focal slowing. There were no interictal epileptiform discharges. There were no electrographic seizures identified. There was no abnormal response to photic stimulation or hyperventilation. Impression: This EEG was obtained while awake and drowsy and is normal.   Clinical Correlation: Normal EEGs, however, do not rule out epilepsy. Bing Neighbors, MD [facility] [phone] If 7pm- 7am, please page neurology on call as listed in AMION.   CT HEAD WO  CONTRAST ( )  Result Date: 10/24/2022 CLINICAL DATA:  Headache EXAM: CT HEAD WITHOUT CONTRAST TECHNIQUE: Contiguous axial images were obtained from the base of the skull through the vertex without intravenous contrast. RADIATION DOSE REDUCTION: This exam was performed according to the departmental dose-optimization program which includes automated exposure control, adjustment of the mA and/or kV according to patient size and/or use of iterative reconstruction technique. COMPARISON:  Yesterday FINDINGS: Brain: No evidence of acute infarction, hemorrhage, hydrocephalus, extra-axial collection or mass lesion/mass effect. Generalized atrophy. Chronic small vessel ischemia in the cerebral white matter. Vascular: No hyperdense vessel or unexpected calcification. Skull: Normal. Negative for fracture or focal lesion. Sinuses/Orbits: No acute finding. IMPRESSION: No acute or interval finding Electronically Signed   By: Tiburcio Pea M.D.   On: 10/24/2022 10:27   DG Chest Portable 1 View  Result Date: 10/24/2022 CLINICAL DATA:  Altered mental status EXAM: PORTABLE CHEST 1 VIEW COMPARISON:  04/08/2022 FINDINGS: The lungs are symmetrically well inflated. Small right pleural effusion is present. Trace diffuse interstitial pulmonary infiltrate is present, progressive since prior examination in keeping with probable trace pulmonary edema. No pneumothorax. Mild cardiomegaly is stable. Left subclavian single lead pacemaker is unchanged. No acute bone abnormality. Radiopacities overlying the right neck base and right axilla likely overlie the patient. IMPRESSION: 1. Trace pulmonary edema and small right pleural effusion. 2. Stable cardiomegaly. Electronically Signed   By: Helyn Numbers M.D.   On: 10/24/2022 09:24   CT Head Wo Contrast  Result Date: 10/23/2022 CLINICAL DATA:  Provided history: Headache, increasing frequency or severity. EXAM: CT HEAD WITHOUT CONTRAST TECHNIQUE: Contiguous axial images were obtained from  the base of the skull through the vertex without intravenous contrast. RADIATION DOSE REDUCTION:  This exam was performed according to the departmental dose-optimization program which includes automated exposure control, adjustment of the mA and/or kV according to patient size and/or use of iterative reconstruction technique. COMPARISON:  Head CT 05/21/2022. FINDINGS: Brain: Generalized parenchymal atrophy. Suspected 4 mm partially calcified meningioma overlying the left parietal lobe, unchanged from the prior head CT 05/21/2022. Redemonstrated chronic lacunar infarct within the right basal ganglia/internal capsule. Background mild patchy and ill-defined hypoattenuation within the cerebral white matter, nonspecific but compatible with chronic small vessel ischemic disease. There is no acute intracranial hemorrhage. No demarcated cortical infarct. No extra-axial fluid collection. No midline shift. Vascular: No hyperdense vessel.  Atherosclerotic calcifications. Skull: No fracture or aggressive osseous lesion. Sinuses/Orbits: No mass or acute finding within the imaged orbits. No significant paranasal sinus disease at the imaged levels. IMPRESSION: 1. No evidence of acute intracranial hemorrhage or acute infarct. 2. Suspected 4 mm partially calcified meningioma overlying the left parietal lobe, unchanged from the prior head CT of 05/21/2022. 3. Redemonstrated chronic lacunar infarct within the right basal ganglia/internal capsule. 4. Background mild cerebral white matter chronic small vessel ischemic disease. 5. Generalized parenchymal atrophy. Electronically Signed   By: Jackey Loge D.O.   On: 10/23/2022 14:45    Microbiology: Results for orders placed or performed during the hospital encounter of 10/23/22  Surgical pcr screen     Status: None   Collection Time: 10/25/22  1:54 PM   Specimen: Nasal Mucosa; Nasal Swab  Result Value Ref Range Status   MRSA, PCR NEGATIVE NEGATIVE Final   Staphylococcus aureus  NEGATIVE NEGATIVE Final    Comment: (NOTE) The Xpert SA Assay (FDA approved for NASAL specimens in patients 16 years of age and older), is one component of a comprehensive surveillance program. It is not intended to diagnose infection nor to guide or monitor treatment. Performed at [facility], [address], [place], [place] [address]   Culture, blood (Routine X 2) w Reflex to ID Panel     Status: None (Preliminary result)   Collection Time: 10/29/22  6:54 PM   Specimen: BLOOD  Result Value Ref Range Status   Specimen Description BLOOD SITE NOT SPECIFIED  Final   Special Requests   Final    BOTTLES DRAWN AEROBIC AND ANAEROBIC Blood Culture results may not be optimal due to an excessive volume of blood received in culture bottles   Culture   Final    NO GROWTH 4 DAYS Performed at [facility], [address], [place], [place] [address]    Report Status PENDING  Incomplete  Culture, blood (Routine X 2) w Reflex to ID Panel     Status: None (Preliminary result)   Collection Time: 10/29/22  6:58 PM   Specimen: BLOOD RIGHT ARM  Result Value Ref Range Status   Specimen Description BLOOD RIGHT ARM  Final   Special Requests   Final    BOTTLES DRAWN AEROBIC AND ANAEROBIC Blood Culture results may not be optimal due to an excessive volume of blood received in culture bottles   Culture   Final    NO GROWTH 4 DAYS Performed at [facility], [address], [place], [place] [address]    Report Status PENDING  Incomplete   Labs: CBC: Recent Labs  Lab 10/29/22 0455 10/30/22 0730 10/31/22 0416 11/01/22 0411 11/02/22 0325  WBC 10.5 8.7 8.1 8.9 7.1  NEUTROABS 8.3* 6.5 5.6 6.7 4.5  HGB 11.4* 11.3* 10.3* 11.6* 10.2*  HCT 35.0*  33.7* 31.9* 35.3* 31.5*  MCV 95.9 95.5 97.3 97.2 97.5  PLT 224 251 252 300 291   Basic Metabolic Panel: Recent Labs  Lab 10/29/22 0455 10/30/22 0730 10/31/22 0416 11/01/22 0411 11/02/22 0325  NA 132* 134* 135 137 133*  K 4.0 3.9 4.0  4.4 3.8  CL 97* 97* 99 98 98  CO2 25 28 29 29 29   GLUCOSE 244* 234* 223* 211* 127*  BUN 17 16 19 18 16   CREATININE 0.51 0.54 0.43* 0.55 0.52  CALCIUM 8.5* 8.5* 8.4* 8.9 8.4*  MG 1.8 1.8 2.0 1.9 1.9  PHOS 2.6 2.2* 3.3 2.7 3.2   Liver Function Tests: Recent Labs  Lab 10/29/22 0455 10/30/22 0730 10/31/22 0416 11/01/22 0411 11/02/22 0325  AST 23 23 36 40 30  ALT 17 19 30  36 29  ALKPHOS 52 57 54 63 57  BILITOT 1.0 1.1 0.9 1.5* 1.1  PROT 5.6* 5.5* 4.8* 5.7* 4.9*  ALBUMIN 2.9* 2.7* 2.3* 2.7* 2.3*   CBG: Recent Labs  Lab 11/01/22 1620 11/01/22 2131 11/02/22 0617 11/02/22 0632 11/02/22 1149  GLUCAP 134* 126* 146* 142* 241*   Discharge time spent: greater than 30 minutes.  Signed: Marguerita Merles, DO [facility] 11/02/2022

## 2022-11-02 NOTE — Progress Notes (Signed)
Physical Therapy Treatment Patient Details Name: Leah Holmes MRN: [id number] DOB: 1926-08-19 Today's Date: 11/02/2022   History of Present Illness Pt is a 89 y.o. female who presented 10/23/22 with left-sided headache behind the eyes associated with visual changes and hallucinations of bright lights and occasional figures. CT head negative for acute or interval finding. Awaiting brain MRI. Course complicated by hospital fall with R femur fracture, s/p IM nail 5/12. PMHx of a-fib on Coumadin, PPM, HTN, left breast cancer s/p radiation and lumpectomy, essential tremor, DM2, CAD, CHF, and IBS. Lewy Body dementia    PT Comments    Pt received in bed, R hip very sore today from hematoma. Went to L side of bed to minimize pain. Pt continues to require mad to max A for mobility. Pt performed supine and seated there ex with assistance as well as sit>stand 3x for strengthening and pregait actvities. Session was ended in bed for later d/c from hospital to SNF. Further needs to be met at [facility].     Recommendations for follow up therapy are one component of a multi-disciplinary discharge planning process, led by the attending physician.  Recommendations may be updated based on patient status, additional functional criteria and insurance authorization.  Follow Up Recommendations  Can patient physically be transported by private vehicle: No    Assistance Recommended at Discharge Frequent or constant Supervision/Assistance  Patient can return home with the following Two people to help with walking and/or transfers;A lot of help with bathing/dressing/bathroom;Assistance with cooking/housework;Direct supervision/assist for medications management;Direct supervision/assist for financial management;Assist for transportation;Help with stairs or ramp for entrance   Equipment Recommendations  None recommended by PT    Recommendations for Other Services       Precautions / Restrictions  Precautions Precautions: Fall Restrictions Weight Bearing Restrictions: Yes RLE Weight Bearing: Partial weight bearing RLE Partial Weight Bearing Percentage or Pounds: 50     Mobility  Bed Mobility Overal bed mobility: Needs Assistance Bed Mobility: Supine to Sit, Sit to Supine     Supine to sit: HOB elevated, +2 for physical assistance, Mod assist Sit to supine: Mod assist, +2 for physical assistance   General bed mobility comments: went to L, away from hurt side today to minimize pain. Max A +2 needed at LE's and trunk. Pt able to assist with hand on rail. Mod A +2 for LE's back into bed with sit to supine    Transfers Overall transfer level: Needs assistance Equipment used: Rolling walker (2 wheels) Transfers: Sit to/from Stand Sit to Stand: +2 safety/equipment, Mod assist           General transfer comment: pt very fearful of falling and with posterior lean. Mod A +2 needed for first stand and min A +2 for 2 more stands for strengthening    Ambulation/Gait             Pre-gait activities: marching in place, side stepping, working on static posture in standing General Gait Details: deferred due to pain   Stairs             Wheelchair Mobility    Modified Rankin (Stroke Patients Only)       Balance Overall balance assessment: Needs assistance Sitting-balance support: No upper extremity supported, Feet supported, Bilateral upper extremity supported Sitting balance-[name]: Fair Sitting balance - Comments: min guard dynamically Postural control: Posterior lean, Left lateral lean Standing balance support: Bilateral upper extremity supported, During functional activity Standing balance-[name]: Poor Standing balance comment: relies on  BUE and external support                            Cognition Arousal/Alertness: Awake/alert Behavior During Therapy: Flat affect Overall Cognitive Status: History of cognitive impairments - at  baseline                                 General Comments: pt following commands today, main limitation is pain        Exercises General Exercises - Lower Extremity Quad Sets: AROM, Both, 10 reps, Supine Long Arc Quad: Right, 10 reps, Seated, AROM Heel Slides: AAROM, Right, 10 reps, Supine Hip ABduction/ADduction: AAROM, Right, 10 reps, Supine Straight Leg Raises: AAROM, Right, 5 reps, Supine    General Comments General comments (skin integrity, edema, etc.): discussed expectations at SNF with pt and daughter, likely d/c today      Pertinent Vitals/Pain Pain Assessment Pain Assessment: Faces Faces Pain Scale: Hurts whole lot Pain Location: R hip Pain Descriptors / Indicators: Discomfort, Grimacing, Guarding Pain Intervention(s): Limited activity within patient's tolerance, Monitored during session    Home Living                          Prior Function            PT Goals (current goals can now be found in the care plan section) Acute Rehab PT Goals Patient Stated Goal: daughter agreeable to SNF PT Goal Formulation: With patient Time For Goal Achievement: 11/09/22 Potential to Achieve Goals: Fair Progress towards PT goals: Progressing toward goals    Frequency    Min 3X/week      PT Plan Current plan remains appropriate    Co-evaluation              AM-PAC PT "6 Clicks" Mobility   Outcome Measure  Help needed turning from your back to your side while in a flat bed without using bedrails?: A Lot Help needed moving from lying on your back to sitting on the side of a flat bed without using bedrails?: A Lot Help needed moving to and from a bed to a chair (including a wheelchair)?: A Lot Help needed standing up from a chair using your arms (e.g., wheelchair or bedside chair)?: A Lot Help needed to walk in hospital room?: A Lot Help needed climbing 3-5 steps with a railing? : Total 6 Click Score: 11    End of Session Equipment  Utilized During Treatment: Gait belt Activity Tolerance: Patient tolerated treatment well Patient left: with call bell/phone within reach;with family/visitor present;in bed;with bed alarm set Nurse Communication: Mobility status PT Visit Diagnosis: Unsteadiness on feet (R26.81);Muscle weakness (generalized) (M62.81);Difficulty in walking, not elsewhere classified (R26.2);Pain Pain - Right/Left: Right Pain - part of body: Hip     Time: 4098-1191 PT Time Calculation (min) (ACUTE ONLY): 22 min  Charges:  $Therapeutic Activity: 8-22 mins                     Lyanne Co, PT  Acute Rehab Services Secure chat preferred Office [phone]    Lawana Chambers Nafisa Olds 11/02/2022, 3:09 PM

## 2022-11-02 NOTE — TOC Transition Note (Signed)
Transition of Care [facility]) - CM/SW Discharge Note   Patient Details  Name: Leah Holmes MRN: [id number] Date of Birth: August 05, 1926  Transition of Care [facility]) CM/SW Contact:  Baldemar Lenis, LCSW Phone Number: 11/02/2022, 3:48 PM   Clinical Narrative:   CSW confirmed with MD that patient is stable for discharge to SNF. CSW updated [facility] and sent discharge summary. CSW updated daughter, Lynden Ang, via phone and she is in agreement. Transport arranged with [facility] for next available.  Nurse to call report to [phone], Room E6    Final next level of care: Skilled Nursing Facility Barriers to Discharge: Barriers Resolved   Patient Goals and CMS Choice CMS Medicare.gov Compare Post Acute Care list provided to:: Patient Represenative (must comment) Choice offered to / list presented to : Adult Children  Discharge Placement                Patient chooses bed at:  [facility]) Patient to be transferred to facility by: [facility] Name of family member notified: Cathy Patient and family notified of of transfer: 11/02/22  Discharge Plan and Services Additional resources added to the After Visit Summary for       Post Acute Care Choice: Skilled Nursing Facility                               Social Determinants of Health (SDOH) Interventions SDOH Screenings   Food Insecurity: No Food Insecurity (10/24/2022)  Housing: Low Risk  (10/24/2022)  Transportation Needs: No Transportation Needs (10/24/2022)  Utilities: Not At Risk (10/24/2022)  Depression (PHQ2-9): Medium Risk (10/23/2022)  Financial Resource Strain: Low Risk  (09/03/2022)  Physical Activity: Unknown (09/03/2022)  Social Connections: Unknown (09/03/2022)  Stress: No Stress Concern Present (09/03/2022)  Tobacco Use: Medium Risk (10/26/2022)     Readmission Risk Interventions     No data to display

## 2022-11-02 NOTE — Progress Notes (Signed)
8 Days Post-Op   Subjective/Chief Complaint: Complains of pain all over but not in abd   Objective: Vital signs in last 24 hours: Temp:  [97.5 F (36.4 C)-98.5 F (36.9 C)] 97.5 F (36.4 C) (05/20 0805) Pulse Rate:  [70-81] 71 (05/20 0805) Resp:  [16-20] 18 (05/20 0805) BP: (121-178)/(53-66) 158/62 (05/20 0805) SpO2:  [93 %-98 %] 98 % (05/20 0805) Last BM Date : 11/01/22  Intake/Output from previous day: 05/19 0701 - 05/20 0700 In: 238 [P.O.:238] Out: 250 [Urine:250] Intake/Output this shift: No intake/output data recorded.  General appearance: alert and cooperative Resp: clear to auscultation bilaterally Cardio: regular rate and rhythm GI: soft, nontender. Good bs  Lab Results:  Recent Labs    11/01/22 0411 11/02/22 0325  WBC 8.9 7.1  HGB 11.6* 10.2*  HCT 35.3* 31.5*  PLT 300 291   BMET Recent Labs    11/01/22 0411 11/02/22 0325  NA 137 133*  K 4.4 3.8  CL 98 98  CO2 29 29  GLUCOSE 211* 127*  BUN 18 16  CREATININE 0.55 0.52  CALCIUM 8.9 8.4*   PT/INR Recent Labs    11/01/22 0411 11/02/22 0325  LABPROT 21.5* 21.3*  INR 1.8* 1.8*   ABG No results for input(s): "PHART", "HCO3" in the last 72 hours.  Invalid input(s): "PCO2", "PO2"  Studies/Results: CT ABDOMEN PELVIS W CONTRAST  Result Date: 11/02/2022 CLINICAL DATA:  Abdominal pain and distention after hip surgery. EXAM: CT ABDOMEN AND PELVIS WITH CONTRAST TECHNIQUE: Multidetector CT imaging of the abdomen and pelvis was performed using the standard protocol following bolus administration of intravenous contrast. RADIATION DOSE REDUCTION: This exam was performed according to the departmental dose-optimization program which includes automated exposure control, adjustment of the mA and/or kV according to patient size and/or use of iterative reconstruction technique. CONTRAST:  75mL OMNIPAQUE IOHEXOL 350 MG/ML SOLN COMPARISON:  03/10/2021 FINDINGS: Lower chest: Cardiomegaly. Bibasilar atelectasis with  small left and tiny right pleural effusions. Hepatobiliary: No suspicious focal abnormality within the liver parenchyma. Cholecystectomy. No intrahepatic or extrahepatic biliary dilation. Pancreas: No focal mass lesion. No dilatation of the main duct. No intraparenchymal cyst. No peripancreatic edema. Spleen: No splenomegaly. No focal mass lesion. Adrenals/Urinary Tract: No adrenal nodule or mass. Central sinus cysts noted bilaterally. No followup imaging is recommended. No evidence for hydroureter. The urinary bladder appears normal for the degree of distention. Stomach/Bowel: Stomach is unremarkable. No gastric wall thickening. No evidence of outlet obstruction. Duodenum is normally positioned as is the ligament of Treitz. No small bowel wall thickening. No small bowel dilatation. The terminal ileum is normal. The appendix is not well visualized, but there is no edema or inflammation in the region of the cecum. Mild circumferential wall thickening noted in the right and transverse colon extending into the splenic flexure. Sigmoid colon appears relatively spared. No substantial pericolonic edema or inflammation. Vascular/Lymphatic: There is advanced atherosclerotic calcification of the abdominal aorta without aneurysm. There is no gastrohepatic or hepatoduodenal ligament lymphadenopathy. No retroperitoneal or mesenteric lymphadenopathy. No pelvic sidewall lymphadenopathy. Reproductive: Hysterectomy.  There is no adnexal mass. Other: Trace free fluid is seen in the pelvis. Musculoskeletal: Status post ORIF for comminuted right femoral neck fracture. There is edema in the soft tissues around the right hip with a small subcutaneous hematoma in this region measuring 2.5 x 1.8 cm. Bones are diffusely demineralized. Compression fractures noted at L1 and L2. IMPRESSION: 1. No evidence for bowel obstruction. 2. Mild circumferential wall thickening in the right and transverse colon  extending into the splenic flexure.  Sigmoid colon appears relatively spared. No substantial pericolonic edema or inflammation. Imaging features may reflect a component of underlying infectious/inflammatory colitis. 3. Status post ORIF for comminuted right femoral neck fracture. There is edema in the soft tissues around the right hip with a small subcutaneous hematoma in this region measuring 2.5 x 1.8 cm. 4. Compression fractures at L1 and L2. 5. Small left and tiny right pleural effusions with bibasilar atelectasis. 6. Cardiomegaly. 7.  Aortic Atherosclerosis (ICD10-I70.0). Electronically Signed   By: Kennith Center M.D.   On: 11/02/2022 07:53   DG Abd 1 View  Result Date: 11/01/2022 CLINICAL DATA:  Abdominal distension EXAM: ABDOMEN - 1 VIEW COMPARISON:  10/31/2022 FINDINGS: Gaseous distension of large and small bowel loops throughout the abdomen. Prominent amount of stool in the right colon. No gross free intraperitoneal air. Diffuse osseous demineralization. Prior right hip ORIF. IMPRESSION: Gaseous distension of large and small bowel loops throughout the abdomen, which may reflect ileus or obstruction. Electronically Signed   By: Duanne Guess D.O.   On: 11/01/2022 09:29   DG Abd 1 View  Result Date: 10/31/2022 CLINICAL DATA:  Abdominal distension.  Abdominal discomfort. EXAM: ABDOMEN - 1 VIEW COMPARISON:  Pelvis 10/25/2022. FINDINGS: Dilated loops of small bowel present. No definite free air is present the supine view. Moderate stool is present the ascending colon. Right hip ORIF noted. IMPRESSION: Dilated loops of small bowel compatible with small bowel obstruction. Electronically Signed   By: Marin Roberts M.D.   On: 10/31/2022 14:27    Anti-infectives: Anti-infectives (From admission, onward)    Start     Dose/Rate Route Frequency Ordered Stop   10/25/22 2200  ceFAZolin (ANCEF) IVPB 2g/100 mL premix        2 g 200 mL/hr over 30 Minutes Intravenous Every 6 hours 10/25/22 1913 10/26/22 0420   10/25/22 1407  ceFAZolin  (ANCEF) 2-4 GM/100ML-% IVPB  Status:  Discontinued       Note to Pharmacy: Kathrene Bongo D: cabinet override      10/25/22 1407 10/25/22 1447   10/25/22 1400  ceFAZolin (ANCEF) IVPB 2g/100 mL premix        2 g 200 mL/hr over 30 Minutes Intravenous On call to O.R. 10/25/22 1336 10/25/22 1616       Assessment/Plan: s/p Procedure(s): INTRAMEDULLARY (IM) NAIL INTERTROCHANTERIC (Right) Advance diet Ileus seems to be resolving Tolerating diet Will sign off  LOS: 8 days    Leah Holmes 11/02/2022

## 2022-11-02 NOTE — Plan of Care (Signed)
  Problem: Education: Goal: Ability to describe self-care measures that may prevent or decrease complications (Diabetes Survival Skills Education) will improve Outcome: Adequate for Discharge Goal: Individualized Educational Video(s) Outcome: Adequate for Discharge   Problem: Coping: Goal: Ability to adjust to condition or change in health will improve Outcome: Adequate for Discharge   Problem: Fluid Volume: Goal: Ability to maintain a balanced intake and output will improve Outcome: Adequate for Discharge   Problem: Health Behavior/Discharge Planning: Goal: Ability to identify and utilize available resources and services will improve Outcome: Adequate for Discharge Goal: Ability to manage health-related needs will improve Outcome: Adequate for Discharge   Problem: Metabolic: Goal: Ability to maintain appropriate glucose levels will improve Outcome: Adequate for Discharge   Problem: Nutritional: Goal: Maintenance of adequate nutrition will improve Outcome: Adequate for Discharge Goal: Progress toward achieving an optimal weight will improve Outcome: Adequate for Discharge   Problem: Skin Integrity: Goal: Risk for impaired skin integrity will decrease Outcome: Adequate for Discharge   Problem: Tissue Perfusion: Goal: Adequacy of tissue perfusion will improve Outcome: Adequate for Discharge   Problem: Education: Goal: Knowledge of General Education information will improve Description: Including pain rating scale, medication(s)/side effects and non-pharmacologic comfort measures Outcome: Adequate for Discharge   Problem: Health Behavior/Discharge Planning: Goal: Ability to manage health-related needs will improve Outcome: Adequate for Discharge   Problem: Clinical Measurements: Goal: Ability to maintain clinical measurements within normal limits will improve Outcome: Adequate for Discharge Goal: Will remain free from infection Outcome: Adequate for Discharge Goal:  Diagnostic test results will improve Outcome: Adequate for Discharge Goal: Respiratory complications will improve Outcome: Adequate for Discharge Goal: Cardiovascular complication will be avoided Outcome: Adequate for Discharge   Problem: Activity: Goal: Risk for activity intolerance will decrease Outcome: Adequate for Discharge   Problem: Nutrition: Goal: Adequate nutrition will be maintained Outcome: Adequate for Discharge   Problem: Coping: Goal: Level of anxiety will decrease Outcome: Adequate for Discharge   Problem: Elimination: Goal: Will not experience complications related to bowel motility Outcome: Adequate for Discharge Goal: Will not experience complications related to urinary retention Outcome: Adequate for Discharge   Problem: Pain Managment: Goal: General experience of comfort will improve Outcome: Adequate for Discharge   Problem: Safety: Goal: Ability to remain free from injury will improve Outcome: Adequate for Discharge   Problem: Skin Integrity: Goal: Risk for impaired skin integrity will decrease Outcome: Adequate for Discharge   Problem: Education: Goal: Knowledge of the prescribed therapeutic regimen will improve Outcome: Adequate for Discharge Goal: Understanding of discharge needs will improve Outcome: Adequate for Discharge Goal: Individualized Educational Video(s) Outcome: Adequate for Discharge   Problem: Activity: Goal: Ability to avoid complications of mobility impairment will improve Outcome: Adequate for Discharge Goal: Ability to tolerate increased activity will improve Outcome: Adequate for Discharge   Problem: Clinical Measurements: Goal: Postoperative complications will be avoided or minimized Outcome: Adequate for Discharge   Problem: Pain Management: Goal: Pain level will decrease with appropriate interventions Outcome: Adequate for Discharge   Problem: Skin Integrity: Goal: Will show signs of wound healing Outcome:  Adequate for Discharge   

## 2022-11-02 NOTE — Progress Notes (Signed)
At start of shift patient was alert and oriented to self, place, and situation. [facility] came to transport patient to [facility] in [place].

## 2022-11-03 DIAGNOSIS — E43 Unspecified severe protein-calorie malnutrition: Secondary | ICD-10-CM | POA: Diagnosis not present

## 2022-11-03 DIAGNOSIS — I480 Paroxysmal atrial fibrillation: Secondary | ICD-10-CM | POA: Diagnosis not present

## 2022-11-03 DIAGNOSIS — R443 Hallucinations, unspecified: Secondary | ICD-10-CM | POA: Diagnosis not present

## 2022-11-03 DIAGNOSIS — E1142 Type 2 diabetes mellitus with diabetic polyneuropathy: Secondary | ICD-10-CM | POA: Diagnosis not present

## 2022-11-03 DIAGNOSIS — F411 Generalized anxiety disorder: Secondary | ICD-10-CM | POA: Diagnosis not present

## 2022-11-03 DIAGNOSIS — K589 Irritable bowel syndrome without diarrhea: Secondary | ICD-10-CM | POA: Diagnosis not present

## 2022-11-03 DIAGNOSIS — F339 Major depressive disorder, recurrent, unspecified: Secondary | ICD-10-CM | POA: Diagnosis not present

## 2022-11-03 DIAGNOSIS — K219 Gastro-esophageal reflux disease without esophagitis: Secondary | ICD-10-CM | POA: Diagnosis not present

## 2022-11-03 DIAGNOSIS — I110 Hypertensive heart disease with heart failure: Secondary | ICD-10-CM | POA: Diagnosis not present

## 2022-11-03 DIAGNOSIS — I2510 Atherosclerotic heart disease of native coronary artery without angina pectoris: Secondary | ICD-10-CM | POA: Diagnosis not present

## 2022-11-03 DIAGNOSIS — I509 Heart failure, unspecified: Secondary | ICD-10-CM | POA: Diagnosis not present

## 2022-11-03 DIAGNOSIS — S72141D Displaced intertrochanteric fracture of right femur, subsequent encounter for closed fracture with routine healing: Secondary | ICD-10-CM | POA: Diagnosis not present

## 2022-11-03 LAB — CULTURE, BLOOD (ROUTINE X 2): Culture: NO GROWTH

## 2022-11-03 NOTE — Consult Note (Signed)
   [facility] CM Inpatient Consult   11/03/2022  Leah Holmes 11-Mar-1927 [id number]  Follow up:  SNF rehab  [facility] [[facility]]  Accountable Care Organization [ACO] Patient: Medicare ACO REACH  Primary Care Provider:  Sherlene Shams, MD with [place] [facility]   Patient was reviewed for extreme high risk score, length of stay and disposition   Patient can be followed by [facility] [[facility]] Care Management PAC RN with traditional Medicare and approved Medicare Advantage plans.    Plan:  Will notify [facility] PAC RN of disposition to [facility] SN[facility] and plans  to return to community with daughter.  For questions or referrals, please contact:   Charlesetta Shanks, RN BSN CCM [facility]  [phone] business mobile phone Toll free office [phone]  *Concierge Line  [phone] Fax number: [phone] [email or web address]

## 2022-11-16 DIAGNOSIS — Z681 Body mass index (BMI) 19 or less, adult: Secondary | ICD-10-CM | POA: Diagnosis not present

## 2022-11-16 DIAGNOSIS — R1030 Lower abdominal pain, unspecified: Secondary | ICD-10-CM | POA: Diagnosis not present

## 2022-11-16 DIAGNOSIS — E43 Unspecified severe protein-calorie malnutrition: Secondary | ICD-10-CM | POA: Diagnosis not present

## 2022-11-16 DIAGNOSIS — R309 Painful micturition, unspecified: Secondary | ICD-10-CM | POA: Diagnosis not present

## 2022-11-17 DIAGNOSIS — I10 Essential (primary) hypertension: Secondary | ICD-10-CM | POA: Diagnosis not present

## 2022-11-17 DIAGNOSIS — I272 Pulmonary hypertension, unspecified: Secondary | ICD-10-CM | POA: Diagnosis not present

## 2022-11-17 DIAGNOSIS — I340 Nonrheumatic mitral (valve) insufficiency: Secondary | ICD-10-CM | POA: Diagnosis not present

## 2022-11-17 DIAGNOSIS — I350 Nonrheumatic aortic (valve) stenosis: Secondary | ICD-10-CM | POA: Diagnosis not present

## 2022-11-17 DIAGNOSIS — I482 Chronic atrial fibrillation, unspecified: Secondary | ICD-10-CM | POA: Diagnosis not present

## 2022-11-17 DIAGNOSIS — E782 Mixed hyperlipidemia: Secondary | ICD-10-CM | POA: Diagnosis not present

## 2022-11-17 DIAGNOSIS — Z7901 Long term (current) use of anticoagulants: Secondary | ICD-10-CM | POA: Diagnosis not present

## 2022-11-17 DIAGNOSIS — I495 Sick sinus syndrome: Secondary | ICD-10-CM | POA: Diagnosis not present

## 2022-11-17 DIAGNOSIS — I700 Atherosclerosis of aorta: Secondary | ICD-10-CM | POA: Diagnosis not present

## 2022-11-17 DIAGNOSIS — I071 Rheumatic tricuspid insufficiency: Secondary | ICD-10-CM | POA: Diagnosis not present

## 2022-11-17 DIAGNOSIS — I517 Cardiomegaly: Secondary | ICD-10-CM | POA: Diagnosis not present

## 2022-11-17 DIAGNOSIS — I2510 Atherosclerotic heart disease of native coronary artery without angina pectoris: Secondary | ICD-10-CM | POA: Diagnosis not present

## 2022-11-20 DIAGNOSIS — Z4789 Encounter for other orthopedic aftercare: Secondary | ICD-10-CM | POA: Diagnosis not present

## 2022-12-15 DIAGNOSIS — K219 Gastro-esophageal reflux disease without esophagitis: Secondary | ICD-10-CM | POA: Diagnosis not present

## 2022-12-15 DIAGNOSIS — E43 Unspecified severe protein-calorie malnutrition: Secondary | ICD-10-CM | POA: Diagnosis not present

## 2022-12-15 DIAGNOSIS — I110 Hypertensive heart disease with heart failure: Secondary | ICD-10-CM | POA: Diagnosis not present

## 2022-12-15 DIAGNOSIS — I509 Heart failure, unspecified: Secondary | ICD-10-CM | POA: Diagnosis not present

## 2022-12-15 DIAGNOSIS — I480 Paroxysmal atrial fibrillation: Secondary | ICD-10-CM | POA: Diagnosis not present

## 2022-12-15 DIAGNOSIS — E1142 Type 2 diabetes mellitus with diabetic polyneuropathy: Secondary | ICD-10-CM | POA: Diagnosis not present

## 2022-12-15 DIAGNOSIS — F411 Generalized anxiety disorder: Secondary | ICD-10-CM | POA: Diagnosis not present

## 2022-12-15 DIAGNOSIS — I495 Sick sinus syndrome: Secondary | ICD-10-CM | POA: Diagnosis not present

## 2022-12-15 DIAGNOSIS — F339 Major depressive disorder, recurrent, unspecified: Secondary | ICD-10-CM | POA: Diagnosis not present

## 2022-12-15 DIAGNOSIS — K589 Irritable bowel syndrome without diarrhea: Secondary | ICD-10-CM | POA: Diagnosis not present

## 2022-12-15 DIAGNOSIS — S72141D Displaced intertrochanteric fracture of right femur, subsequent encounter for closed fracture with routine healing: Secondary | ICD-10-CM | POA: Diagnosis not present

## 2022-12-15 DIAGNOSIS — R5381 Other malaise: Secondary | ICD-10-CM | POA: Diagnosis not present

## 2022-12-25 DIAGNOSIS — I480 Paroxysmal atrial fibrillation: Secondary | ICD-10-CM | POA: Diagnosis not present

## 2022-12-28 DIAGNOSIS — I480 Paroxysmal atrial fibrillation: Secondary | ICD-10-CM | POA: Diagnosis not present

## 2022-12-28 DIAGNOSIS — F411 Generalized anxiety disorder: Secondary | ICD-10-CM | POA: Diagnosis not present

## 2022-12-28 DIAGNOSIS — F339 Major depressive disorder, recurrent, unspecified: Secondary | ICD-10-CM | POA: Diagnosis not present

## 2022-12-28 DIAGNOSIS — R5381 Other malaise: Secondary | ICD-10-CM | POA: Diagnosis not present

## 2022-12-28 DIAGNOSIS — Z7901 Long term (current) use of anticoagulants: Secondary | ICD-10-CM | POA: Diagnosis not present

## 2022-12-28 DIAGNOSIS — Z5181 Encounter for therapeutic drug level monitoring: Secondary | ICD-10-CM | POA: Diagnosis not present

## 2022-12-28 DIAGNOSIS — S72141D Displaced intertrochanteric fracture of right femur, subsequent encounter for closed fracture with routine healing: Secondary | ICD-10-CM | POA: Diagnosis not present

## 2022-12-31 DIAGNOSIS — Z7901 Long term (current) use of anticoagulants: Secondary | ICD-10-CM | POA: Diagnosis not present

## 2022-12-31 DIAGNOSIS — I10 Essential (primary) hypertension: Secondary | ICD-10-CM | POA: Diagnosis not present

## 2023-01-02 DIAGNOSIS — Z7901 Long term (current) use of anticoagulants: Secondary | ICD-10-CM | POA: Diagnosis not present

## 2023-01-06 DIAGNOSIS — R791 Abnormal coagulation profile: Secondary | ICD-10-CM | POA: Diagnosis not present

## 2023-01-16 DIAGNOSIS — I509 Heart failure, unspecified: Secondary | ICD-10-CM | POA: Diagnosis not present

## 2023-01-17 DIAGNOSIS — I4891 Unspecified atrial fibrillation: Secondary | ICD-10-CM | POA: Diagnosis not present

## 2023-01-20 DIAGNOSIS — I480 Paroxysmal atrial fibrillation: Secondary | ICD-10-CM | POA: Diagnosis not present

## 2023-01-27 DIAGNOSIS — I480 Paroxysmal atrial fibrillation: Secondary | ICD-10-CM | POA: Diagnosis not present

## 2023-01-28 DIAGNOSIS — I4891 Unspecified atrial fibrillation: Secondary | ICD-10-CM | POA: Diagnosis not present

## 2023-01-31 DIAGNOSIS — N390 Urinary tract infection, site not specified: Secondary | ICD-10-CM | POA: Diagnosis not present

## 2023-02-01 DIAGNOSIS — I4891 Unspecified atrial fibrillation: Secondary | ICD-10-CM | POA: Diagnosis not present

## 2023-02-02 DIAGNOSIS — I509 Heart failure, unspecified: Secondary | ICD-10-CM | POA: Diagnosis not present

## 2023-02-02 DIAGNOSIS — F411 Generalized anxiety disorder: Secondary | ICD-10-CM | POA: Diagnosis not present

## 2023-02-02 DIAGNOSIS — M810 Age-related osteoporosis without current pathological fracture: Secondary | ICD-10-CM | POA: Diagnosis not present

## 2023-02-02 DIAGNOSIS — I110 Hypertensive heart disease with heart failure: Secondary | ICD-10-CM | POA: Diagnosis not present

## 2023-02-02 DIAGNOSIS — I480 Paroxysmal atrial fibrillation: Secondary | ICD-10-CM | POA: Diagnosis not present

## 2023-02-04 DIAGNOSIS — I509 Heart failure, unspecified: Secondary | ICD-10-CM | POA: Diagnosis not present

## 2023-02-10 DIAGNOSIS — I480 Paroxysmal atrial fibrillation: Secondary | ICD-10-CM | POA: Diagnosis not present

## 2023-02-17 DIAGNOSIS — I480 Paroxysmal atrial fibrillation: Secondary | ICD-10-CM | POA: Diagnosis not present

## 2023-02-18 DIAGNOSIS — I509 Heart failure, unspecified: Secondary | ICD-10-CM | POA: Diagnosis not present

## 2023-02-19 DIAGNOSIS — I480 Paroxysmal atrial fibrillation: Secondary | ICD-10-CM | POA: Diagnosis not present

## 2023-02-19 DIAGNOSIS — R791 Abnormal coagulation profile: Secondary | ICD-10-CM | POA: Diagnosis not present

## 2023-02-22 DIAGNOSIS — Z7901 Long term (current) use of anticoagulants: Secondary | ICD-10-CM | POA: Diagnosis not present

## 2023-03-01 DIAGNOSIS — M810 Age-related osteoporosis without current pathological fracture: Secondary | ICD-10-CM | POA: Diagnosis not present

## 2023-03-01 DIAGNOSIS — I509 Heart failure, unspecified: Secondary | ICD-10-CM | POA: Diagnosis not present

## 2023-03-01 DIAGNOSIS — Z7901 Long term (current) use of anticoagulants: Secondary | ICD-10-CM | POA: Diagnosis not present

## 2023-03-01 DIAGNOSIS — D649 Anemia, unspecified: Secondary | ICD-10-CM | POA: Diagnosis not present

## 2023-03-01 DIAGNOSIS — I480 Paroxysmal atrial fibrillation: Secondary | ICD-10-CM | POA: Diagnosis not present

## 2023-03-01 DIAGNOSIS — I110 Hypertensive heart disease with heart failure: Secondary | ICD-10-CM | POA: Diagnosis not present

## 2023-03-01 DIAGNOSIS — I10 Essential (primary) hypertension: Secondary | ICD-10-CM | POA: Diagnosis not present

## 2023-03-02 DIAGNOSIS — I10 Essential (primary) hypertension: Secondary | ICD-10-CM | POA: Diagnosis not present

## 2023-03-02 DIAGNOSIS — I482 Chronic atrial fibrillation, unspecified: Secondary | ICD-10-CM | POA: Diagnosis not present

## 2023-03-02 DIAGNOSIS — I517 Cardiomegaly: Secondary | ICD-10-CM | POA: Diagnosis not present

## 2023-03-02 DIAGNOSIS — I350 Nonrheumatic aortic (valve) stenosis: Secondary | ICD-10-CM | POA: Diagnosis not present

## 2023-03-02 DIAGNOSIS — E782 Mixed hyperlipidemia: Secondary | ICD-10-CM | POA: Diagnosis not present

## 2023-03-02 DIAGNOSIS — I340 Nonrheumatic mitral (valve) insufficiency: Secondary | ICD-10-CM | POA: Diagnosis not present

## 2023-03-02 DIAGNOSIS — I6523 Occlusion and stenosis of bilateral carotid arteries: Secondary | ICD-10-CM | POA: Diagnosis not present

## 2023-03-02 DIAGNOSIS — I2510 Atherosclerotic heart disease of native coronary artery without angina pectoris: Secondary | ICD-10-CM | POA: Diagnosis not present

## 2023-03-02 DIAGNOSIS — I272 Pulmonary hypertension, unspecified: Secondary | ICD-10-CM | POA: Diagnosis not present

## 2023-03-02 DIAGNOSIS — I495 Sick sinus syndrome: Secondary | ICD-10-CM | POA: Diagnosis not present

## 2023-03-08 DIAGNOSIS — I10 Essential (primary) hypertension: Secondary | ICD-10-CM | POA: Diagnosis not present

## 2023-03-08 DIAGNOSIS — Z7901 Long term (current) use of anticoagulants: Secondary | ICD-10-CM | POA: Diagnosis not present

## 2023-03-11 DIAGNOSIS — Z7901 Long term (current) use of anticoagulants: Secondary | ICD-10-CM | POA: Diagnosis not present

## 2023-03-15 DIAGNOSIS — Z7901 Long term (current) use of anticoagulants: Secondary | ICD-10-CM | POA: Diagnosis not present

## 2023-03-15 DIAGNOSIS — I480 Paroxysmal atrial fibrillation: Secondary | ICD-10-CM | POA: Diagnosis not present

## 2023-03-18 DIAGNOSIS — I480 Paroxysmal atrial fibrillation: Secondary | ICD-10-CM | POA: Diagnosis not present

## 2023-03-22 DIAGNOSIS — R791 Abnormal coagulation profile: Secondary | ICD-10-CM | POA: Diagnosis not present

## 2023-03-25 DIAGNOSIS — E43 Unspecified severe protein-calorie malnutrition: Secondary | ICD-10-CM | POA: Diagnosis not present

## 2023-03-25 DIAGNOSIS — Z950 Presence of cardiac pacemaker: Secondary | ICD-10-CM | POA: Diagnosis not present

## 2023-03-25 DIAGNOSIS — M25551 Pain in right hip: Secondary | ICD-10-CM | POA: Diagnosis not present

## 2023-03-25 DIAGNOSIS — E1142 Type 2 diabetes mellitus with diabetic polyneuropathy: Secondary | ICD-10-CM | POA: Diagnosis not present

## 2023-03-25 DIAGNOSIS — I480 Paroxysmal atrial fibrillation: Secondary | ICD-10-CM | POA: Diagnosis not present

## 2023-03-25 DIAGNOSIS — I509 Heart failure, unspecified: Secondary | ICD-10-CM | POA: Diagnosis not present

## 2023-03-25 DIAGNOSIS — M810 Age-related osteoporosis without current pathological fracture: Secondary | ICD-10-CM | POA: Diagnosis not present

## 2023-03-25 DIAGNOSIS — I110 Hypertensive heart disease with heart failure: Secondary | ICD-10-CM | POA: Diagnosis not present

## 2023-03-25 DIAGNOSIS — Z9181 History of falling: Secondary | ICD-10-CM | POA: Diagnosis not present

## 2023-03-25 DIAGNOSIS — F3340 Major depressive disorder, recurrent, in remission, unspecified: Secondary | ICD-10-CM | POA: Diagnosis not present

## 2023-03-25 DIAGNOSIS — Z7984 Long term (current) use of oral hypoglycemic drugs: Secondary | ICD-10-CM | POA: Diagnosis not present

## 2023-03-25 DIAGNOSIS — F411 Generalized anxiety disorder: Secondary | ICD-10-CM | POA: Diagnosis not present

## 2023-03-26 DIAGNOSIS — M25571 Pain in right ankle and joints of right foot: Secondary | ICD-10-CM | POA: Diagnosis not present

## 2023-03-26 DIAGNOSIS — M25551 Pain in right hip: Secondary | ICD-10-CM | POA: Diagnosis not present

## 2023-03-29 DIAGNOSIS — I4891 Unspecified atrial fibrillation: Secondary | ICD-10-CM | POA: Diagnosis not present

## 2023-03-29 DIAGNOSIS — Z7901 Long term (current) use of anticoagulants: Secondary | ICD-10-CM | POA: Diagnosis not present

## 2023-03-29 DIAGNOSIS — R791 Abnormal coagulation profile: Secondary | ICD-10-CM | POA: Diagnosis not present

## 2023-03-30 DIAGNOSIS — L03032 Cellulitis of left toe: Secondary | ICD-10-CM | POA: Diagnosis not present

## 2023-03-30 DIAGNOSIS — M25471 Effusion, right ankle: Secondary | ICD-10-CM | POA: Diagnosis not present

## 2023-04-02 DIAGNOSIS — Z7901 Long term (current) use of anticoagulants: Secondary | ICD-10-CM | POA: Diagnosis not present

## 2023-04-06 DIAGNOSIS — I480 Paroxysmal atrial fibrillation: Secondary | ICD-10-CM | POA: Diagnosis not present

## 2023-04-09 DIAGNOSIS — I480 Paroxysmal atrial fibrillation: Secondary | ICD-10-CM | POA: Diagnosis not present

## 2023-04-09 DIAGNOSIS — Z7901 Long term (current) use of anticoagulants: Secondary | ICD-10-CM | POA: Diagnosis not present

## 2023-04-12 DIAGNOSIS — I480 Paroxysmal atrial fibrillation: Secondary | ICD-10-CM | POA: Diagnosis not present

## 2023-04-12 DIAGNOSIS — Z7901 Long term (current) use of anticoagulants: Secondary | ICD-10-CM | POA: Diagnosis not present

## 2023-04-13 DIAGNOSIS — I480 Paroxysmal atrial fibrillation: Secondary | ICD-10-CM | POA: Diagnosis not present

## 2023-04-13 DIAGNOSIS — Z7901 Long term (current) use of anticoagulants: Secondary | ICD-10-CM | POA: Diagnosis not present

## 2023-04-15 DIAGNOSIS — M545 Low back pain, unspecified: Secondary | ICD-10-CM | POA: Diagnosis not present

## 2023-04-15 DIAGNOSIS — R109 Unspecified abdominal pain: Secondary | ICD-10-CM | POA: Diagnosis not present

## 2023-04-15 DIAGNOSIS — G8929 Other chronic pain: Secondary | ICD-10-CM | POA: Diagnosis not present

## 2023-04-16 DIAGNOSIS — M545 Low back pain, unspecified: Secondary | ICD-10-CM | POA: Diagnosis not present

## 2023-04-17 DIAGNOSIS — N3000 Acute cystitis without hematuria: Secondary | ICD-10-CM | POA: Diagnosis not present

## 2023-04-19 DIAGNOSIS — R7303 Prediabetes: Secondary | ICD-10-CM | POA: Diagnosis not present

## 2023-04-19 DIAGNOSIS — D649 Anemia, unspecified: Secondary | ICD-10-CM | POA: Diagnosis not present

## 2023-04-19 DIAGNOSIS — I509 Heart failure, unspecified: Secondary | ICD-10-CM | POA: Diagnosis not present

## 2023-04-19 DIAGNOSIS — I10 Essential (primary) hypertension: Secondary | ICD-10-CM | POA: Diagnosis not present

## 2023-04-19 DIAGNOSIS — I480 Paroxysmal atrial fibrillation: Secondary | ICD-10-CM | POA: Diagnosis not present

## 2023-04-19 DIAGNOSIS — S32020D Wedge compression fracture of second lumbar vertebra, subsequent encounter for fracture with routine healing: Secondary | ICD-10-CM | POA: Diagnosis not present

## 2023-04-20 DIAGNOSIS — Z9181 History of falling: Secondary | ICD-10-CM | POA: Diagnosis not present

## 2023-04-20 DIAGNOSIS — R262 Difficulty in walking, not elsewhere classified: Secondary | ICD-10-CM | POA: Diagnosis not present

## 2023-04-20 DIAGNOSIS — R2689 Other abnormalities of gait and mobility: Secondary | ICD-10-CM | POA: Diagnosis not present

## 2023-04-22 DIAGNOSIS — R2689 Other abnormalities of gait and mobility: Secondary | ICD-10-CM | POA: Diagnosis not present

## 2023-04-22 DIAGNOSIS — R262 Difficulty in walking, not elsewhere classified: Secondary | ICD-10-CM | POA: Diagnosis not present

## 2023-04-22 DIAGNOSIS — Z9181 History of falling: Secondary | ICD-10-CM | POA: Diagnosis not present

## 2023-04-23 DIAGNOSIS — Z9181 History of falling: Secondary | ICD-10-CM | POA: Diagnosis not present

## 2023-04-23 DIAGNOSIS — R262 Difficulty in walking, not elsewhere classified: Secondary | ICD-10-CM | POA: Diagnosis not present

## 2023-04-23 DIAGNOSIS — R2689 Other abnormalities of gait and mobility: Secondary | ICD-10-CM | POA: Diagnosis not present

## 2023-04-26 DIAGNOSIS — R262 Difficulty in walking, not elsewhere classified: Secondary | ICD-10-CM | POA: Diagnosis not present

## 2023-04-26 DIAGNOSIS — R2689 Other abnormalities of gait and mobility: Secondary | ICD-10-CM | POA: Diagnosis not present

## 2023-04-26 DIAGNOSIS — Z9181 History of falling: Secondary | ICD-10-CM | POA: Diagnosis not present

## 2023-04-27 DIAGNOSIS — I480 Paroxysmal atrial fibrillation: Secondary | ICD-10-CM | POA: Diagnosis not present

## 2023-04-27 DIAGNOSIS — Z9181 History of falling: Secondary | ICD-10-CM | POA: Diagnosis not present

## 2023-04-27 DIAGNOSIS — R2689 Other abnormalities of gait and mobility: Secondary | ICD-10-CM | POA: Diagnosis not present

## 2023-04-27 DIAGNOSIS — R262 Difficulty in walking, not elsewhere classified: Secondary | ICD-10-CM | POA: Diagnosis not present

## 2023-04-28 DIAGNOSIS — Z9181 History of falling: Secondary | ICD-10-CM | POA: Diagnosis not present

## 2023-04-28 DIAGNOSIS — I480 Paroxysmal atrial fibrillation: Secondary | ICD-10-CM | POA: Diagnosis not present

## 2023-04-28 DIAGNOSIS — R109 Unspecified abdominal pain: Secondary | ICD-10-CM | POA: Diagnosis not present

## 2023-04-28 DIAGNOSIS — S32020D Wedge compression fracture of second lumbar vertebra, subsequent encounter for fracture with routine healing: Secondary | ICD-10-CM | POA: Diagnosis not present

## 2023-04-28 DIAGNOSIS — Z7901 Long term (current) use of anticoagulants: Secondary | ICD-10-CM | POA: Diagnosis not present

## 2023-04-28 DIAGNOSIS — R262 Difficulty in walking, not elsewhere classified: Secondary | ICD-10-CM | POA: Diagnosis not present

## 2023-04-28 DIAGNOSIS — R2689 Other abnormalities of gait and mobility: Secondary | ICD-10-CM | POA: Diagnosis not present

## 2023-04-29 DIAGNOSIS — R262 Difficulty in walking, not elsewhere classified: Secondary | ICD-10-CM | POA: Diagnosis not present

## 2023-04-29 DIAGNOSIS — R2689 Other abnormalities of gait and mobility: Secondary | ICD-10-CM | POA: Diagnosis not present

## 2023-04-29 DIAGNOSIS — Z9181 History of falling: Secondary | ICD-10-CM | POA: Diagnosis not present

## 2023-04-30 DIAGNOSIS — R262 Difficulty in walking, not elsewhere classified: Secondary | ICD-10-CM | POA: Diagnosis not present

## 2023-04-30 DIAGNOSIS — I480 Paroxysmal atrial fibrillation: Secondary | ICD-10-CM | POA: Diagnosis not present

## 2023-04-30 DIAGNOSIS — Z9181 History of falling: Secondary | ICD-10-CM | POA: Diagnosis not present

## 2023-04-30 DIAGNOSIS — R2689 Other abnormalities of gait and mobility: Secondary | ICD-10-CM | POA: Diagnosis not present

## 2023-05-03 DIAGNOSIS — R2689 Other abnormalities of gait and mobility: Secondary | ICD-10-CM | POA: Diagnosis not present

## 2023-05-03 DIAGNOSIS — Z9181 History of falling: Secondary | ICD-10-CM | POA: Diagnosis not present

## 2023-05-03 DIAGNOSIS — R262 Difficulty in walking, not elsewhere classified: Secondary | ICD-10-CM | POA: Diagnosis not present

## 2023-05-03 DIAGNOSIS — I480 Paroxysmal atrial fibrillation: Secondary | ICD-10-CM | POA: Diagnosis not present

## 2023-05-04 DIAGNOSIS — Z9181 History of falling: Secondary | ICD-10-CM | POA: Diagnosis not present

## 2023-05-04 DIAGNOSIS — R262 Difficulty in walking, not elsewhere classified: Secondary | ICD-10-CM | POA: Diagnosis not present

## 2023-05-04 DIAGNOSIS — R2689 Other abnormalities of gait and mobility: Secondary | ICD-10-CM | POA: Diagnosis not present

## 2023-05-04 NOTE — Telephone Encounter (Signed)
Error

## 2023-05-05 DIAGNOSIS — R2689 Other abnormalities of gait and mobility: Secondary | ICD-10-CM | POA: Diagnosis not present

## 2023-05-05 DIAGNOSIS — Z9181 History of falling: Secondary | ICD-10-CM | POA: Diagnosis not present

## 2023-05-05 DIAGNOSIS — R262 Difficulty in walking, not elsewhere classified: Secondary | ICD-10-CM | POA: Diagnosis not present

## 2023-05-06 DIAGNOSIS — R2689 Other abnormalities of gait and mobility: Secondary | ICD-10-CM | POA: Diagnosis not present

## 2023-05-06 DIAGNOSIS — R262 Difficulty in walking, not elsewhere classified: Secondary | ICD-10-CM | POA: Diagnosis not present

## 2023-05-06 DIAGNOSIS — Z9181 History of falling: Secondary | ICD-10-CM | POA: Diagnosis not present

## 2023-05-07 DIAGNOSIS — R262 Difficulty in walking, not elsewhere classified: Secondary | ICD-10-CM | POA: Diagnosis not present

## 2023-05-07 DIAGNOSIS — I480 Paroxysmal atrial fibrillation: Secondary | ICD-10-CM | POA: Diagnosis not present

## 2023-05-07 DIAGNOSIS — R2689 Other abnormalities of gait and mobility: Secondary | ICD-10-CM | POA: Diagnosis not present

## 2023-05-07 DIAGNOSIS — Z9181 History of falling: Secondary | ICD-10-CM | POA: Diagnosis not present

## 2023-05-10 DIAGNOSIS — R5381 Other malaise: Secondary | ICD-10-CM | POA: Diagnosis not present

## 2023-05-10 DIAGNOSIS — Z9181 History of falling: Secondary | ICD-10-CM | POA: Diagnosis not present

## 2023-05-10 DIAGNOSIS — S32020D Wedge compression fracture of second lumbar vertebra, subsequent encounter for fracture with routine healing: Secondary | ICD-10-CM | POA: Diagnosis not present

## 2023-05-10 DIAGNOSIS — I480 Paroxysmal atrial fibrillation: Secondary | ICD-10-CM | POA: Diagnosis not present

## 2023-05-10 DIAGNOSIS — R2689 Other abnormalities of gait and mobility: Secondary | ICD-10-CM | POA: Diagnosis not present

## 2023-05-10 DIAGNOSIS — R262 Difficulty in walking, not elsewhere classified: Secondary | ICD-10-CM | POA: Diagnosis not present

## 2023-05-10 DIAGNOSIS — I509 Heart failure, unspecified: Secondary | ICD-10-CM | POA: Diagnosis not present

## 2023-05-11 DIAGNOSIS — R262 Difficulty in walking, not elsewhere classified: Secondary | ICD-10-CM | POA: Diagnosis not present

## 2023-05-11 DIAGNOSIS — Z9181 History of falling: Secondary | ICD-10-CM | POA: Diagnosis not present

## 2023-05-11 DIAGNOSIS — R2689 Other abnormalities of gait and mobility: Secondary | ICD-10-CM | POA: Diagnosis not present

## 2023-05-12 DIAGNOSIS — Z9181 History of falling: Secondary | ICD-10-CM | POA: Diagnosis not present

## 2023-05-12 DIAGNOSIS — Z79899 Other long term (current) drug therapy: Secondary | ICD-10-CM | POA: Diagnosis not present

## 2023-05-12 DIAGNOSIS — Z7901 Long term (current) use of anticoagulants: Secondary | ICD-10-CM | POA: Diagnosis not present

## 2023-05-12 DIAGNOSIS — R2689 Other abnormalities of gait and mobility: Secondary | ICD-10-CM | POA: Diagnosis not present

## 2023-05-12 DIAGNOSIS — R262 Difficulty in walking, not elsewhere classified: Secondary | ICD-10-CM | POA: Diagnosis not present

## 2023-05-13 DIAGNOSIS — R2689 Other abnormalities of gait and mobility: Secondary | ICD-10-CM | POA: Diagnosis not present

## 2023-05-13 DIAGNOSIS — R262 Difficulty in walking, not elsewhere classified: Secondary | ICD-10-CM | POA: Diagnosis not present

## 2023-05-13 DIAGNOSIS — Z9181 History of falling: Secondary | ICD-10-CM | POA: Diagnosis not present

## 2023-05-14 DIAGNOSIS — Z9181 History of falling: Secondary | ICD-10-CM | POA: Diagnosis not present

## 2023-05-14 DIAGNOSIS — R2689 Other abnormalities of gait and mobility: Secondary | ICD-10-CM | POA: Diagnosis not present

## 2023-05-14 DIAGNOSIS — R262 Difficulty in walking, not elsewhere classified: Secondary | ICD-10-CM | POA: Diagnosis not present

## 2023-05-17 DIAGNOSIS — R2689 Other abnormalities of gait and mobility: Secondary | ICD-10-CM | POA: Diagnosis not present

## 2023-05-17 DIAGNOSIS — R262 Difficulty in walking, not elsewhere classified: Secondary | ICD-10-CM | POA: Diagnosis not present

## 2023-05-17 DIAGNOSIS — Z9181 History of falling: Secondary | ICD-10-CM | POA: Diagnosis not present

## 2023-05-17 DIAGNOSIS — I480 Paroxysmal atrial fibrillation: Secondary | ICD-10-CM | POA: Diagnosis not present

## 2023-05-18 DIAGNOSIS — R2689 Other abnormalities of gait and mobility: Secondary | ICD-10-CM | POA: Diagnosis not present

## 2023-05-18 DIAGNOSIS — Z9181 History of falling: Secondary | ICD-10-CM | POA: Diagnosis not present

## 2023-05-18 DIAGNOSIS — R262 Difficulty in walking, not elsewhere classified: Secondary | ICD-10-CM | POA: Diagnosis not present

## 2023-05-19 DIAGNOSIS — Z9181 History of falling: Secondary | ICD-10-CM | POA: Diagnosis not present

## 2023-05-19 DIAGNOSIS — R262 Difficulty in walking, not elsewhere classified: Secondary | ICD-10-CM | POA: Diagnosis not present

## 2023-05-19 DIAGNOSIS — R2689 Other abnormalities of gait and mobility: Secondary | ICD-10-CM | POA: Diagnosis not present

## 2023-05-20 DIAGNOSIS — R2689 Other abnormalities of gait and mobility: Secondary | ICD-10-CM | POA: Diagnosis not present

## 2023-05-20 DIAGNOSIS — Z9181 History of falling: Secondary | ICD-10-CM | POA: Diagnosis not present

## 2023-05-20 DIAGNOSIS — R262 Difficulty in walking, not elsewhere classified: Secondary | ICD-10-CM | POA: Diagnosis not present

## 2023-05-21 DIAGNOSIS — R262 Difficulty in walking, not elsewhere classified: Secondary | ICD-10-CM | POA: Diagnosis not present

## 2023-05-21 DIAGNOSIS — R2689 Other abnormalities of gait and mobility: Secondary | ICD-10-CM | POA: Diagnosis not present

## 2023-05-21 DIAGNOSIS — Z9181 History of falling: Secondary | ICD-10-CM | POA: Diagnosis not present

## 2023-05-24 DIAGNOSIS — R2689 Other abnormalities of gait and mobility: Secondary | ICD-10-CM | POA: Diagnosis not present

## 2023-05-24 DIAGNOSIS — R262 Difficulty in walking, not elsewhere classified: Secondary | ICD-10-CM | POA: Diagnosis not present

## 2023-05-24 DIAGNOSIS — Z9181 History of falling: Secondary | ICD-10-CM | POA: Diagnosis not present

## 2023-05-24 DIAGNOSIS — I480 Paroxysmal atrial fibrillation: Secondary | ICD-10-CM | POA: Diagnosis not present

## 2023-05-25 DIAGNOSIS — R262 Difficulty in walking, not elsewhere classified: Secondary | ICD-10-CM | POA: Diagnosis not present

## 2023-05-25 DIAGNOSIS — Z9181 History of falling: Secondary | ICD-10-CM | POA: Diagnosis not present

## 2023-05-25 DIAGNOSIS — R2689 Other abnormalities of gait and mobility: Secondary | ICD-10-CM | POA: Diagnosis not present

## 2023-05-26 DIAGNOSIS — Z9181 History of falling: Secondary | ICD-10-CM | POA: Diagnosis not present

## 2023-05-26 DIAGNOSIS — R2689 Other abnormalities of gait and mobility: Secondary | ICD-10-CM | POA: Diagnosis not present

## 2023-05-26 DIAGNOSIS — R262 Difficulty in walking, not elsewhere classified: Secondary | ICD-10-CM | POA: Diagnosis not present

## 2023-05-26 DIAGNOSIS — I480 Paroxysmal atrial fibrillation: Secondary | ICD-10-CM | POA: Diagnosis not present

## 2023-05-27 DIAGNOSIS — R262 Difficulty in walking, not elsewhere classified: Secondary | ICD-10-CM | POA: Diagnosis not present

## 2023-05-27 DIAGNOSIS — R2689 Other abnormalities of gait and mobility: Secondary | ICD-10-CM | POA: Diagnosis not present

## 2023-05-27 DIAGNOSIS — Z9181 History of falling: Secondary | ICD-10-CM | POA: Diagnosis not present

## 2023-05-27 DIAGNOSIS — I495 Sick sinus syndrome: Secondary | ICD-10-CM | POA: Diagnosis not present

## 2023-05-28 DIAGNOSIS — L602 Onychogryphosis: Secondary | ICD-10-CM | POA: Diagnosis not present

## 2023-05-28 DIAGNOSIS — Z7901 Long term (current) use of anticoagulants: Secondary | ICD-10-CM | POA: Diagnosis not present

## 2023-05-28 DIAGNOSIS — L603 Nail dystrophy: Secondary | ICD-10-CM | POA: Diagnosis not present

## 2023-05-28 DIAGNOSIS — I480 Paroxysmal atrial fibrillation: Secondary | ICD-10-CM | POA: Diagnosis not present

## 2023-05-28 DIAGNOSIS — R2689 Other abnormalities of gait and mobility: Secondary | ICD-10-CM | POA: Diagnosis not present

## 2023-05-28 DIAGNOSIS — R262 Difficulty in walking, not elsewhere classified: Secondary | ICD-10-CM | POA: Diagnosis not present

## 2023-05-28 DIAGNOSIS — I739 Peripheral vascular disease, unspecified: Secondary | ICD-10-CM | POA: Diagnosis not present

## 2023-05-28 DIAGNOSIS — Z9181 History of falling: Secondary | ICD-10-CM | POA: Diagnosis not present

## 2023-05-31 DIAGNOSIS — E1142 Type 2 diabetes mellitus with diabetic polyneuropathy: Secondary | ICD-10-CM | POA: Diagnosis not present

## 2023-05-31 DIAGNOSIS — Z681 Body mass index (BMI) 19 or less, adult: Secondary | ICD-10-CM | POA: Diagnosis not present

## 2023-05-31 DIAGNOSIS — S32020D Wedge compression fracture of second lumbar vertebra, subsequent encounter for fracture with routine healing: Secondary | ICD-10-CM | POA: Diagnosis not present

## 2023-05-31 DIAGNOSIS — Z7984 Long term (current) use of oral hypoglycemic drugs: Secondary | ICD-10-CM | POA: Diagnosis not present

## 2023-05-31 DIAGNOSIS — Z7901 Long term (current) use of anticoagulants: Secondary | ICD-10-CM | POA: Diagnosis not present

## 2023-05-31 DIAGNOSIS — R262 Difficulty in walking, not elsewhere classified: Secondary | ICD-10-CM | POA: Diagnosis not present

## 2023-05-31 DIAGNOSIS — Z9181 History of falling: Secondary | ICD-10-CM | POA: Diagnosis not present

## 2023-05-31 DIAGNOSIS — I480 Paroxysmal atrial fibrillation: Secondary | ICD-10-CM | POA: Diagnosis not present

## 2023-05-31 DIAGNOSIS — R2689 Other abnormalities of gait and mobility: Secondary | ICD-10-CM | POA: Diagnosis not present

## 2023-05-31 DIAGNOSIS — Z7189 Other specified counseling: Secondary | ICD-10-CM | POA: Diagnosis not present

## 2023-06-01 DIAGNOSIS — R2689 Other abnormalities of gait and mobility: Secondary | ICD-10-CM | POA: Diagnosis not present

## 2023-06-01 DIAGNOSIS — R262 Difficulty in walking, not elsewhere classified: Secondary | ICD-10-CM | POA: Diagnosis not present

## 2023-06-01 DIAGNOSIS — Z9181 History of falling: Secondary | ICD-10-CM | POA: Diagnosis not present

## 2023-06-02 DIAGNOSIS — Z9181 History of falling: Secondary | ICD-10-CM | POA: Diagnosis not present

## 2023-06-02 DIAGNOSIS — R2689 Other abnormalities of gait and mobility: Secondary | ICD-10-CM | POA: Diagnosis not present

## 2023-06-02 DIAGNOSIS — R262 Difficulty in walking, not elsewhere classified: Secondary | ICD-10-CM | POA: Diagnosis not present

## 2023-06-04 DIAGNOSIS — Z9181 History of falling: Secondary | ICD-10-CM | POA: Diagnosis not present

## 2023-06-04 DIAGNOSIS — R262 Difficulty in walking, not elsewhere classified: Secondary | ICD-10-CM | POA: Diagnosis not present

## 2023-06-04 DIAGNOSIS — Z7901 Long term (current) use of anticoagulants: Secondary | ICD-10-CM | POA: Diagnosis not present

## 2023-06-04 DIAGNOSIS — I480 Paroxysmal atrial fibrillation: Secondary | ICD-10-CM | POA: Diagnosis not present

## 2023-06-04 DIAGNOSIS — R2689 Other abnormalities of gait and mobility: Secondary | ICD-10-CM | POA: Diagnosis not present

## 2023-06-05 DIAGNOSIS — R2689 Other abnormalities of gait and mobility: Secondary | ICD-10-CM | POA: Diagnosis not present

## 2023-06-05 DIAGNOSIS — R262 Difficulty in walking, not elsewhere classified: Secondary | ICD-10-CM | POA: Diagnosis not present

## 2023-06-05 DIAGNOSIS — Z9181 History of falling: Secondary | ICD-10-CM | POA: Diagnosis not present

## 2023-06-07 DIAGNOSIS — R2689 Other abnormalities of gait and mobility: Secondary | ICD-10-CM | POA: Diagnosis not present

## 2023-06-07 DIAGNOSIS — Z9181 History of falling: Secondary | ICD-10-CM | POA: Diagnosis not present

## 2023-06-07 DIAGNOSIS — R262 Difficulty in walking, not elsewhere classified: Secondary | ICD-10-CM | POA: Diagnosis not present

## 2023-06-08 DIAGNOSIS — R262 Difficulty in walking, not elsewhere classified: Secondary | ICD-10-CM | POA: Diagnosis not present

## 2023-06-08 DIAGNOSIS — Z9181 History of falling: Secondary | ICD-10-CM | POA: Diagnosis not present

## 2023-06-08 DIAGNOSIS — R2689 Other abnormalities of gait and mobility: Secondary | ICD-10-CM | POA: Diagnosis not present

## 2023-06-10 DIAGNOSIS — R262 Difficulty in walking, not elsewhere classified: Secondary | ICD-10-CM | POA: Diagnosis not present

## 2023-06-10 DIAGNOSIS — Z9181 History of falling: Secondary | ICD-10-CM | POA: Diagnosis not present

## 2023-06-10 DIAGNOSIS — R2689 Other abnormalities of gait and mobility: Secondary | ICD-10-CM | POA: Diagnosis not present

## 2023-06-11 DIAGNOSIS — I480 Paroxysmal atrial fibrillation: Secondary | ICD-10-CM | POA: Diagnosis not present

## 2023-06-11 DIAGNOSIS — R2689 Other abnormalities of gait and mobility: Secondary | ICD-10-CM | POA: Diagnosis not present

## 2023-06-11 DIAGNOSIS — Z9181 History of falling: Secondary | ICD-10-CM | POA: Diagnosis not present

## 2023-06-11 DIAGNOSIS — R262 Difficulty in walking, not elsewhere classified: Secondary | ICD-10-CM | POA: Diagnosis not present

## 2023-06-13 DIAGNOSIS — R2689 Other abnormalities of gait and mobility: Secondary | ICD-10-CM | POA: Diagnosis not present

## 2023-06-13 DIAGNOSIS — Z9181 History of falling: Secondary | ICD-10-CM | POA: Diagnosis not present

## 2023-06-13 DIAGNOSIS — R262 Difficulty in walking, not elsewhere classified: Secondary | ICD-10-CM | POA: Diagnosis not present

## 2023-06-14 DIAGNOSIS — Z9181 History of falling: Secondary | ICD-10-CM | POA: Diagnosis not present

## 2023-06-14 DIAGNOSIS — R2689 Other abnormalities of gait and mobility: Secondary | ICD-10-CM | POA: Diagnosis not present

## 2023-06-14 DIAGNOSIS — I480 Paroxysmal atrial fibrillation: Secondary | ICD-10-CM | POA: Diagnosis not present

## 2023-06-14 DIAGNOSIS — R262 Difficulty in walking, not elsewhere classified: Secondary | ICD-10-CM | POA: Diagnosis not present

## 2023-06-15 DIAGNOSIS — I4891 Unspecified atrial fibrillation: Secondary | ICD-10-CM | POA: Diagnosis not present

## 2023-06-15 DIAGNOSIS — Z950 Presence of cardiac pacemaker: Secondary | ICD-10-CM | POA: Diagnosis not present

## 2023-06-15 DIAGNOSIS — Z7901 Long term (current) use of anticoagulants: Secondary | ICD-10-CM | POA: Diagnosis not present

## 2023-06-21 DIAGNOSIS — I480 Paroxysmal atrial fibrillation: Secondary | ICD-10-CM | POA: Diagnosis not present

## 2023-06-23 DIAGNOSIS — I480 Paroxysmal atrial fibrillation: Secondary | ICD-10-CM | POA: Diagnosis not present

## 2023-06-25 DIAGNOSIS — Z9181 History of falling: Secondary | ICD-10-CM | POA: Diagnosis not present

## 2023-06-25 DIAGNOSIS — I4891 Unspecified atrial fibrillation: Secondary | ICD-10-CM | POA: Diagnosis not present

## 2023-06-25 DIAGNOSIS — Z950 Presence of cardiac pacemaker: Secondary | ICD-10-CM | POA: Diagnosis not present

## 2023-06-28 DIAGNOSIS — I480 Paroxysmal atrial fibrillation: Secondary | ICD-10-CM | POA: Diagnosis not present

## 2023-07-05 DIAGNOSIS — I480 Paroxysmal atrial fibrillation: Secondary | ICD-10-CM | POA: Diagnosis not present

## 2023-07-12 DIAGNOSIS — I480 Paroxysmal atrial fibrillation: Secondary | ICD-10-CM | POA: Diagnosis not present

## 2023-07-16 DIAGNOSIS — I480 Paroxysmal atrial fibrillation: Secondary | ICD-10-CM | POA: Diagnosis not present

## 2023-07-16 DIAGNOSIS — E782 Mixed hyperlipidemia: Secondary | ICD-10-CM | POA: Diagnosis not present

## 2023-07-16 DIAGNOSIS — K649 Unspecified hemorrhoids: Secondary | ICD-10-CM | POA: Diagnosis not present

## 2023-07-16 DIAGNOSIS — I495 Sick sinus syndrome: Secondary | ICD-10-CM | POA: Diagnosis not present

## 2023-07-16 DIAGNOSIS — I509 Heart failure, unspecified: Secondary | ICD-10-CM | POA: Diagnosis not present

## 2023-07-16 DIAGNOSIS — F3340 Major depressive disorder, recurrent, in remission, unspecified: Secondary | ICD-10-CM | POA: Diagnosis not present

## 2023-07-16 DIAGNOSIS — F411 Generalized anxiety disorder: Secondary | ICD-10-CM | POA: Diagnosis not present

## 2023-07-16 DIAGNOSIS — Z7901 Long term (current) use of anticoagulants: Secondary | ICD-10-CM | POA: Diagnosis not present

## 2023-07-16 DIAGNOSIS — Z950 Presence of cardiac pacemaker: Secondary | ICD-10-CM | POA: Diagnosis not present

## 2023-07-16 DIAGNOSIS — I110 Hypertensive heart disease with heart failure: Secondary | ICD-10-CM | POA: Diagnosis not present

## 2023-07-19 DIAGNOSIS — I480 Paroxysmal atrial fibrillation: Secondary | ICD-10-CM | POA: Diagnosis not present

## 2023-07-23 DIAGNOSIS — I480 Paroxysmal atrial fibrillation: Secondary | ICD-10-CM | POA: Diagnosis not present

## 2023-07-23 DIAGNOSIS — Z7901 Long term (current) use of anticoagulants: Secondary | ICD-10-CM | POA: Diagnosis not present

## 2023-07-23 DIAGNOSIS — F411 Generalized anxiety disorder: Secondary | ICD-10-CM | POA: Diagnosis not present

## 2023-07-26 DIAGNOSIS — I10 Essential (primary) hypertension: Secondary | ICD-10-CM | POA: Diagnosis not present

## 2023-07-26 DIAGNOSIS — I480 Paroxysmal atrial fibrillation: Secondary | ICD-10-CM | POA: Diagnosis not present

## 2023-07-26 DIAGNOSIS — I509 Heart failure, unspecified: Secondary | ICD-10-CM | POA: Diagnosis not present

## 2023-07-30 DIAGNOSIS — Z7901 Long term (current) use of anticoagulants: Secondary | ICD-10-CM | POA: Diagnosis not present

## 2023-07-30 DIAGNOSIS — F411 Generalized anxiety disorder: Secondary | ICD-10-CM | POA: Diagnosis not present

## 2023-07-30 DIAGNOSIS — M25561 Pain in right knee: Secondary | ICD-10-CM | POA: Diagnosis not present

## 2023-07-30 DIAGNOSIS — M79651 Pain in right thigh: Secondary | ICD-10-CM | POA: Diagnosis not present

## 2023-07-30 DIAGNOSIS — R251 Tremor, unspecified: Secondary | ICD-10-CM | POA: Diagnosis not present

## 2023-07-30 DIAGNOSIS — G8929 Other chronic pain: Secondary | ICD-10-CM | POA: Diagnosis not present

## 2023-07-30 DIAGNOSIS — M25511 Pain in right shoulder: Secondary | ICD-10-CM | POA: Diagnosis not present

## 2023-07-30 DIAGNOSIS — M25471 Effusion, right ankle: Secondary | ICD-10-CM | POA: Diagnosis not present

## 2023-08-02 DIAGNOSIS — R791 Abnormal coagulation profile: Secondary | ICD-10-CM | POA: Diagnosis not present

## 2023-08-02 DIAGNOSIS — F411 Generalized anxiety disorder: Secondary | ICD-10-CM | POA: Diagnosis not present

## 2023-08-02 DIAGNOSIS — I480 Paroxysmal atrial fibrillation: Secondary | ICD-10-CM | POA: Diagnosis not present

## 2023-08-02 DIAGNOSIS — Z7189 Other specified counseling: Secondary | ICD-10-CM | POA: Diagnosis not present

## 2023-08-06 DIAGNOSIS — R262 Difficulty in walking, not elsewhere classified: Secondary | ICD-10-CM | POA: Diagnosis not present

## 2023-08-06 DIAGNOSIS — R269 Unspecified abnormalities of gait and mobility: Secondary | ICD-10-CM | POA: Diagnosis not present

## 2023-08-09 DIAGNOSIS — R7303 Prediabetes: Secondary | ICD-10-CM | POA: Diagnosis not present

## 2023-08-09 DIAGNOSIS — M6281 Muscle weakness (generalized): Secondary | ICD-10-CM | POA: Diagnosis not present

## 2023-08-09 DIAGNOSIS — I509 Heart failure, unspecified: Secondary | ICD-10-CM | POA: Diagnosis not present

## 2023-08-09 DIAGNOSIS — I110 Hypertensive heart disease with heart failure: Secondary | ICD-10-CM | POA: Diagnosis not present

## 2023-08-09 DIAGNOSIS — M79622 Pain in left upper arm: Secondary | ICD-10-CM | POA: Diagnosis not present

## 2023-08-09 DIAGNOSIS — I480 Paroxysmal atrial fibrillation: Secondary | ICD-10-CM | POA: Diagnosis not present

## 2023-08-09 DIAGNOSIS — R262 Difficulty in walking, not elsewhere classified: Secondary | ICD-10-CM | POA: Diagnosis not present

## 2023-08-09 DIAGNOSIS — R269 Unspecified abnormalities of gait and mobility: Secondary | ICD-10-CM | POA: Diagnosis not present

## 2023-08-09 DIAGNOSIS — Z7901 Long term (current) use of anticoagulants: Secondary | ICD-10-CM | POA: Diagnosis not present

## 2023-08-09 DIAGNOSIS — F411 Generalized anxiety disorder: Secondary | ICD-10-CM | POA: Diagnosis not present

## 2023-08-10 DIAGNOSIS — I2510 Atherosclerotic heart disease of native coronary artery without angina pectoris: Secondary | ICD-10-CM | POA: Diagnosis not present

## 2023-08-10 DIAGNOSIS — R079 Chest pain, unspecified: Secondary | ICD-10-CM | POA: Diagnosis not present

## 2023-08-10 DIAGNOSIS — Z23 Encounter for immunization: Secondary | ICD-10-CM | POA: Diagnosis not present

## 2023-08-10 DIAGNOSIS — I10 Essential (primary) hypertension: Secondary | ICD-10-CM | POA: Diagnosis not present

## 2023-08-10 DIAGNOSIS — R262 Difficulty in walking, not elsewhere classified: Secondary | ICD-10-CM | POA: Diagnosis not present

## 2023-08-10 DIAGNOSIS — I350 Nonrheumatic aortic (valve) stenosis: Secondary | ICD-10-CM | POA: Diagnosis not present

## 2023-08-10 DIAGNOSIS — I482 Chronic atrial fibrillation, unspecified: Secondary | ICD-10-CM | POA: Diagnosis not present

## 2023-08-10 DIAGNOSIS — R269 Unspecified abnormalities of gait and mobility: Secondary | ICD-10-CM | POA: Diagnosis not present

## 2023-08-10 DIAGNOSIS — I340 Nonrheumatic mitral (valve) insufficiency: Secondary | ICD-10-CM | POA: Diagnosis not present

## 2023-08-10 DIAGNOSIS — I495 Sick sinus syndrome: Secondary | ICD-10-CM | POA: Diagnosis not present

## 2023-08-10 DIAGNOSIS — E782 Mixed hyperlipidemia: Secondary | ICD-10-CM | POA: Diagnosis not present

## 2023-08-11 DIAGNOSIS — R52 Pain, unspecified: Secondary | ICD-10-CM | POA: Diagnosis not present

## 2023-08-11 DIAGNOSIS — R269 Unspecified abnormalities of gait and mobility: Secondary | ICD-10-CM | POA: Diagnosis not present

## 2023-08-11 DIAGNOSIS — R262 Difficulty in walking, not elsewhere classified: Secondary | ICD-10-CM | POA: Diagnosis not present

## 2023-08-12 DIAGNOSIS — R262 Difficulty in walking, not elsewhere classified: Secondary | ICD-10-CM | POA: Diagnosis not present

## 2023-08-12 DIAGNOSIS — R251 Tremor, unspecified: Secondary | ICD-10-CM | POA: Diagnosis not present

## 2023-08-12 DIAGNOSIS — S32020D Wedge compression fracture of second lumbar vertebra, subsequent encounter for fracture with routine healing: Secondary | ICD-10-CM | POA: Diagnosis not present

## 2023-08-12 DIAGNOSIS — E1142 Type 2 diabetes mellitus with diabetic polyneuropathy: Secondary | ICD-10-CM | POA: Diagnosis not present

## 2023-08-12 DIAGNOSIS — I110 Hypertensive heart disease with heart failure: Secondary | ICD-10-CM | POA: Diagnosis not present

## 2023-08-12 DIAGNOSIS — F3340 Major depressive disorder, recurrent, in remission, unspecified: Secondary | ICD-10-CM | POA: Diagnosis not present

## 2023-08-12 DIAGNOSIS — I495 Sick sinus syndrome: Secondary | ICD-10-CM | POA: Diagnosis not present

## 2023-08-12 DIAGNOSIS — I480 Paroxysmal atrial fibrillation: Secondary | ICD-10-CM | POA: Diagnosis not present

## 2023-08-12 DIAGNOSIS — G8929 Other chronic pain: Secondary | ICD-10-CM | POA: Diagnosis not present

## 2023-08-12 DIAGNOSIS — I509 Heart failure, unspecified: Secondary | ICD-10-CM | POA: Diagnosis not present

## 2023-08-12 DIAGNOSIS — Z9181 History of falling: Secondary | ICD-10-CM | POA: Diagnosis not present

## 2023-08-12 DIAGNOSIS — R269 Unspecified abnormalities of gait and mobility: Secondary | ICD-10-CM | POA: Diagnosis not present

## 2023-08-12 DIAGNOSIS — Z950 Presence of cardiac pacemaker: Secondary | ICD-10-CM | POA: Diagnosis not present

## 2023-08-12 DIAGNOSIS — F411 Generalized anxiety disorder: Secondary | ICD-10-CM | POA: Diagnosis not present

## 2023-08-13 DIAGNOSIS — Z8739 Personal history of other diseases of the musculoskeletal system and connective tissue: Secondary | ICD-10-CM | POA: Diagnosis not present

## 2023-08-13 DIAGNOSIS — K529 Noninfective gastroenteritis and colitis, unspecified: Secondary | ICD-10-CM | POA: Diagnosis not present

## 2023-08-13 DIAGNOSIS — F411 Generalized anxiety disorder: Secondary | ICD-10-CM | POA: Diagnosis not present

## 2023-08-13 DIAGNOSIS — I480 Paroxysmal atrial fibrillation: Secondary | ICD-10-CM | POA: Diagnosis not present

## 2023-08-13 DIAGNOSIS — I509 Heart failure, unspecified: Secondary | ICD-10-CM | POA: Diagnosis not present

## 2023-08-13 DIAGNOSIS — Z7901 Long term (current) use of anticoagulants: Secondary | ICD-10-CM | POA: Diagnosis not present

## 2023-08-16 DIAGNOSIS — R269 Unspecified abnormalities of gait and mobility: Secondary | ICD-10-CM | POA: Diagnosis not present

## 2023-08-16 DIAGNOSIS — Z7984 Long term (current) use of oral hypoglycemic drugs: Secondary | ICD-10-CM | POA: Diagnosis not present

## 2023-08-16 DIAGNOSIS — I480 Paroxysmal atrial fibrillation: Secondary | ICD-10-CM | POA: Diagnosis not present

## 2023-08-16 DIAGNOSIS — E1142 Type 2 diabetes mellitus with diabetic polyneuropathy: Secondary | ICD-10-CM | POA: Diagnosis not present

## 2023-08-16 DIAGNOSIS — F411 Generalized anxiety disorder: Secondary | ICD-10-CM | POA: Diagnosis not present

## 2023-08-16 DIAGNOSIS — R262 Difficulty in walking, not elsewhere classified: Secondary | ICD-10-CM | POA: Diagnosis not present

## 2023-08-17 DIAGNOSIS — R262 Difficulty in walking, not elsewhere classified: Secondary | ICD-10-CM | POA: Diagnosis not present

## 2023-08-17 DIAGNOSIS — R269 Unspecified abnormalities of gait and mobility: Secondary | ICD-10-CM | POA: Diagnosis not present

## 2023-08-18 DIAGNOSIS — R269 Unspecified abnormalities of gait and mobility: Secondary | ICD-10-CM | POA: Diagnosis not present

## 2023-08-18 DIAGNOSIS — R262 Difficulty in walking, not elsewhere classified: Secondary | ICD-10-CM | POA: Diagnosis not present

## 2023-08-19 DIAGNOSIS — R262 Difficulty in walking, not elsewhere classified: Secondary | ICD-10-CM | POA: Diagnosis not present

## 2023-08-19 DIAGNOSIS — R269 Unspecified abnormalities of gait and mobility: Secondary | ICD-10-CM | POA: Diagnosis not present

## 2023-08-20 DIAGNOSIS — Z7901 Long term (current) use of anticoagulants: Secondary | ICD-10-CM | POA: Diagnosis not present

## 2023-08-20 DIAGNOSIS — R269 Unspecified abnormalities of gait and mobility: Secondary | ICD-10-CM | POA: Diagnosis not present

## 2023-08-20 DIAGNOSIS — Z7984 Long term (current) use of oral hypoglycemic drugs: Secondary | ICD-10-CM | POA: Diagnosis not present

## 2023-08-20 DIAGNOSIS — F411 Generalized anxiety disorder: Secondary | ICD-10-CM | POA: Diagnosis not present

## 2023-08-20 DIAGNOSIS — R262 Difficulty in walking, not elsewhere classified: Secondary | ICD-10-CM | POA: Diagnosis not present

## 2023-08-20 DIAGNOSIS — Z8739 Personal history of other diseases of the musculoskeletal system and connective tissue: Secondary | ICD-10-CM | POA: Diagnosis not present

## 2023-08-20 DIAGNOSIS — I480 Paroxysmal atrial fibrillation: Secondary | ICD-10-CM | POA: Diagnosis not present

## 2023-08-20 DIAGNOSIS — Z8719 Personal history of other diseases of the digestive system: Secondary | ICD-10-CM | POA: Diagnosis not present

## 2023-08-20 DIAGNOSIS — E1142 Type 2 diabetes mellitus with diabetic polyneuropathy: Secondary | ICD-10-CM | POA: Diagnosis not present

## 2023-08-22 DIAGNOSIS — I480 Paroxysmal atrial fibrillation: Secondary | ICD-10-CM | POA: Diagnosis not present

## 2023-08-23 DIAGNOSIS — Z7901 Long term (current) use of anticoagulants: Secondary | ICD-10-CM | POA: Diagnosis not present

## 2023-08-23 DIAGNOSIS — I480 Paroxysmal atrial fibrillation: Secondary | ICD-10-CM | POA: Diagnosis not present

## 2023-08-23 DIAGNOSIS — R791 Abnormal coagulation profile: Secondary | ICD-10-CM | POA: Diagnosis not present

## 2023-08-23 DIAGNOSIS — I10 Essential (primary) hypertension: Secondary | ICD-10-CM | POA: Diagnosis not present

## 2023-08-23 DIAGNOSIS — F411 Generalized anxiety disorder: Secondary | ICD-10-CM | POA: Diagnosis not present

## 2023-08-23 DIAGNOSIS — R7303 Prediabetes: Secondary | ICD-10-CM | POA: Diagnosis not present

## 2023-08-23 DIAGNOSIS — Z8719 Personal history of other diseases of the digestive system: Secondary | ICD-10-CM | POA: Diagnosis not present

## 2023-08-23 DIAGNOSIS — I509 Heart failure, unspecified: Secondary | ICD-10-CM | POA: Diagnosis not present

## 2023-08-24 DIAGNOSIS — I480 Paroxysmal atrial fibrillation: Secondary | ICD-10-CM | POA: Diagnosis not present

## 2023-08-24 DIAGNOSIS — R262 Difficulty in walking, not elsewhere classified: Secondary | ICD-10-CM | POA: Diagnosis not present

## 2023-08-24 DIAGNOSIS — R269 Unspecified abnormalities of gait and mobility: Secondary | ICD-10-CM | POA: Diagnosis not present

## 2023-08-25 DIAGNOSIS — R269 Unspecified abnormalities of gait and mobility: Secondary | ICD-10-CM | POA: Diagnosis not present

## 2023-08-25 DIAGNOSIS — L602 Onychogryphosis: Secondary | ICD-10-CM | POA: Diagnosis not present

## 2023-08-25 DIAGNOSIS — I739 Peripheral vascular disease, unspecified: Secondary | ICD-10-CM | POA: Diagnosis not present

## 2023-08-25 DIAGNOSIS — L603 Nail dystrophy: Secondary | ICD-10-CM | POA: Diagnosis not present

## 2023-08-25 DIAGNOSIS — R262 Difficulty in walking, not elsewhere classified: Secondary | ICD-10-CM | POA: Diagnosis not present

## 2023-08-26 DIAGNOSIS — R262 Difficulty in walking, not elsewhere classified: Secondary | ICD-10-CM | POA: Diagnosis not present

## 2023-08-26 DIAGNOSIS — R269 Unspecified abnormalities of gait and mobility: Secondary | ICD-10-CM | POA: Diagnosis not present

## 2023-08-27 DIAGNOSIS — R262 Difficulty in walking, not elsewhere classified: Secondary | ICD-10-CM | POA: Diagnosis not present

## 2023-08-27 DIAGNOSIS — R269 Unspecified abnormalities of gait and mobility: Secondary | ICD-10-CM | POA: Diagnosis not present

## 2023-08-30 DIAGNOSIS — Z7901 Long term (current) use of anticoagulants: Secondary | ICD-10-CM | POA: Diagnosis not present

## 2023-08-30 DIAGNOSIS — R262 Difficulty in walking, not elsewhere classified: Secondary | ICD-10-CM | POA: Diagnosis not present

## 2023-08-30 DIAGNOSIS — I480 Paroxysmal atrial fibrillation: Secondary | ICD-10-CM | POA: Diagnosis not present

## 2023-08-30 DIAGNOSIS — R269 Unspecified abnormalities of gait and mobility: Secondary | ICD-10-CM | POA: Diagnosis not present

## 2023-08-31 DIAGNOSIS — R262 Difficulty in walking, not elsewhere classified: Secondary | ICD-10-CM | POA: Diagnosis not present

## 2023-08-31 DIAGNOSIS — R269 Unspecified abnormalities of gait and mobility: Secondary | ICD-10-CM | POA: Diagnosis not present

## 2023-09-06 DIAGNOSIS — I480 Paroxysmal atrial fibrillation: Secondary | ICD-10-CM | POA: Diagnosis not present

## 2023-09-06 DIAGNOSIS — Z7901 Long term (current) use of anticoagulants: Secondary | ICD-10-CM | POA: Diagnosis not present

## 2023-09-08 DIAGNOSIS — I495 Sick sinus syndrome: Secondary | ICD-10-CM | POA: Diagnosis not present

## 2023-09-13 DIAGNOSIS — I480 Paroxysmal atrial fibrillation: Secondary | ICD-10-CM | POA: Diagnosis not present

## 2023-09-13 DIAGNOSIS — Z7901 Long term (current) use of anticoagulants: Secondary | ICD-10-CM | POA: Diagnosis not present

## 2023-09-20 DIAGNOSIS — Z7901 Long term (current) use of anticoagulants: Secondary | ICD-10-CM | POA: Diagnosis not present

## 2023-09-24 DIAGNOSIS — R062 Wheezing: Secondary | ICD-10-CM | POA: Diagnosis not present

## 2023-09-24 DIAGNOSIS — Z7901 Long term (current) use of anticoagulants: Secondary | ICD-10-CM | POA: Diagnosis not present

## 2023-09-24 DIAGNOSIS — I480 Paroxysmal atrial fibrillation: Secondary | ICD-10-CM | POA: Diagnosis not present

## 2023-09-24 DIAGNOSIS — J309 Allergic rhinitis, unspecified: Secondary | ICD-10-CM | POA: Diagnosis not present

## 2023-09-24 DIAGNOSIS — R059 Cough, unspecified: Secondary | ICD-10-CM | POA: Diagnosis not present

## 2023-09-24 DIAGNOSIS — E1142 Type 2 diabetes mellitus with diabetic polyneuropathy: Secondary | ICD-10-CM | POA: Diagnosis not present

## 2023-09-24 DIAGNOSIS — Z7984 Long term (current) use of oral hypoglycemic drugs: Secondary | ICD-10-CM | POA: Diagnosis not present

## 2023-09-27 DIAGNOSIS — J309 Allergic rhinitis, unspecified: Secondary | ICD-10-CM | POA: Diagnosis not present

## 2023-09-27 DIAGNOSIS — I509 Heart failure, unspecified: Secondary | ICD-10-CM | POA: Diagnosis not present

## 2023-09-27 DIAGNOSIS — Z7901 Long term (current) use of anticoagulants: Secondary | ICD-10-CM | POA: Diagnosis not present

## 2023-09-27 DIAGNOSIS — F411 Generalized anxiety disorder: Secondary | ICD-10-CM | POA: Diagnosis not present

## 2023-09-27 DIAGNOSIS — I480 Paroxysmal atrial fibrillation: Secondary | ICD-10-CM | POA: Diagnosis not present

## 2023-10-01 DIAGNOSIS — I480 Paroxysmal atrial fibrillation: Secondary | ICD-10-CM | POA: Diagnosis not present

## 2023-10-01 DIAGNOSIS — Z7901 Long term (current) use of anticoagulants: Secondary | ICD-10-CM | POA: Diagnosis not present

## 2023-10-01 DIAGNOSIS — E1142 Type 2 diabetes mellitus with diabetic polyneuropathy: Secondary | ICD-10-CM | POA: Diagnosis not present

## 2023-10-01 DIAGNOSIS — R062 Wheezing: Secondary | ICD-10-CM | POA: Diagnosis not present

## 2023-10-01 DIAGNOSIS — Z7984 Long term (current) use of oral hypoglycemic drugs: Secondary | ICD-10-CM | POA: Diagnosis not present

## 2023-10-01 DIAGNOSIS — I509 Heart failure, unspecified: Secondary | ICD-10-CM | POA: Diagnosis not present

## 2023-10-04 DIAGNOSIS — I480 Paroxysmal atrial fibrillation: Secondary | ICD-10-CM | POA: Diagnosis not present

## 2023-10-11 DIAGNOSIS — I480 Paroxysmal atrial fibrillation: Secondary | ICD-10-CM | POA: Diagnosis not present

## 2023-10-15 DIAGNOSIS — R42 Dizziness and giddiness: Secondary | ICD-10-CM | POA: Diagnosis not present

## 2023-10-15 DIAGNOSIS — R251 Tremor, unspecified: Secondary | ICD-10-CM | POA: Diagnosis not present

## 2023-10-15 DIAGNOSIS — E876 Hypokalemia: Secondary | ICD-10-CM | POA: Diagnosis not present

## 2023-10-15 DIAGNOSIS — F411 Generalized anxiety disorder: Secondary | ICD-10-CM | POA: Diagnosis not present

## 2023-10-18 DIAGNOSIS — R791 Abnormal coagulation profile: Secondary | ICD-10-CM | POA: Diagnosis not present

## 2023-10-19 DIAGNOSIS — I480 Paroxysmal atrial fibrillation: Secondary | ICD-10-CM | POA: Diagnosis not present

## 2023-10-25 DIAGNOSIS — I10 Essential (primary) hypertension: Secondary | ICD-10-CM | POA: Diagnosis not present

## 2023-10-25 DIAGNOSIS — D649 Anemia, unspecified: Secondary | ICD-10-CM | POA: Diagnosis not present

## 2023-10-25 DIAGNOSIS — E119 Type 2 diabetes mellitus without complications: Secondary | ICD-10-CM | POA: Diagnosis not present

## 2023-10-25 DIAGNOSIS — Z7901 Long term (current) use of anticoagulants: Secondary | ICD-10-CM | POA: Diagnosis not present

## 2023-11-01 DIAGNOSIS — Z7901 Long term (current) use of anticoagulants: Secondary | ICD-10-CM | POA: Diagnosis not present

## 2023-11-02 DIAGNOSIS — R269 Unspecified abnormalities of gait and mobility: Secondary | ICD-10-CM | POA: Diagnosis not present

## 2023-11-02 DIAGNOSIS — R262 Difficulty in walking, not elsewhere classified: Secondary | ICD-10-CM | POA: Diagnosis not present

## 2023-11-03 DIAGNOSIS — R269 Unspecified abnormalities of gait and mobility: Secondary | ICD-10-CM | POA: Diagnosis not present

## 2023-11-03 DIAGNOSIS — R262 Difficulty in walking, not elsewhere classified: Secondary | ICD-10-CM | POA: Diagnosis not present

## 2023-11-04 DIAGNOSIS — R269 Unspecified abnormalities of gait and mobility: Secondary | ICD-10-CM | POA: Diagnosis not present

## 2023-11-04 DIAGNOSIS — R262 Difficulty in walking, not elsewhere classified: Secondary | ICD-10-CM | POA: Diagnosis not present

## 2023-11-05 DIAGNOSIS — R262 Difficulty in walking, not elsewhere classified: Secondary | ICD-10-CM | POA: Diagnosis not present

## 2023-11-05 DIAGNOSIS — R269 Unspecified abnormalities of gait and mobility: Secondary | ICD-10-CM | POA: Diagnosis not present

## 2023-11-08 DIAGNOSIS — R791 Abnormal coagulation profile: Secondary | ICD-10-CM | POA: Diagnosis not present

## 2023-11-08 DIAGNOSIS — R262 Difficulty in walking, not elsewhere classified: Secondary | ICD-10-CM | POA: Diagnosis not present

## 2023-11-08 DIAGNOSIS — R269 Unspecified abnormalities of gait and mobility: Secondary | ICD-10-CM | POA: Diagnosis not present

## 2023-11-09 DIAGNOSIS — R262 Difficulty in walking, not elsewhere classified: Secondary | ICD-10-CM | POA: Diagnosis not present

## 2023-11-09 DIAGNOSIS — R269 Unspecified abnormalities of gait and mobility: Secondary | ICD-10-CM | POA: Diagnosis not present

## 2023-11-10 DIAGNOSIS — R262 Difficulty in walking, not elsewhere classified: Secondary | ICD-10-CM | POA: Diagnosis not present

## 2023-11-10 DIAGNOSIS — R269 Unspecified abnormalities of gait and mobility: Secondary | ICD-10-CM | POA: Diagnosis not present

## 2023-11-11 DIAGNOSIS — R269 Unspecified abnormalities of gait and mobility: Secondary | ICD-10-CM | POA: Diagnosis not present

## 2023-11-11 DIAGNOSIS — I480 Paroxysmal atrial fibrillation: Secondary | ICD-10-CM | POA: Diagnosis not present

## 2023-11-11 DIAGNOSIS — R262 Difficulty in walking, not elsewhere classified: Secondary | ICD-10-CM | POA: Diagnosis not present

## 2023-11-12 DIAGNOSIS — R262 Difficulty in walking, not elsewhere classified: Secondary | ICD-10-CM | POA: Diagnosis not present

## 2023-11-12 DIAGNOSIS — R269 Unspecified abnormalities of gait and mobility: Secondary | ICD-10-CM | POA: Diagnosis not present

## 2023-11-15 DIAGNOSIS — L603 Nail dystrophy: Secondary | ICD-10-CM | POA: Diagnosis not present

## 2023-11-15 DIAGNOSIS — R2689 Other abnormalities of gait and mobility: Secondary | ICD-10-CM | POA: Diagnosis not present

## 2023-11-15 DIAGNOSIS — R262 Difficulty in walking, not elsewhere classified: Secondary | ICD-10-CM | POA: Diagnosis not present

## 2023-11-15 DIAGNOSIS — I739 Peripheral vascular disease, unspecified: Secondary | ICD-10-CM | POA: Diagnosis not present

## 2023-11-15 DIAGNOSIS — I480 Paroxysmal atrial fibrillation: Secondary | ICD-10-CM | POA: Diagnosis not present

## 2023-11-15 DIAGNOSIS — L602 Onychogryphosis: Secondary | ICD-10-CM | POA: Diagnosis not present

## 2023-11-16 DIAGNOSIS — R2689 Other abnormalities of gait and mobility: Secondary | ICD-10-CM | POA: Diagnosis not present

## 2023-11-16 DIAGNOSIS — R262 Difficulty in walking, not elsewhere classified: Secondary | ICD-10-CM | POA: Diagnosis not present

## 2023-11-17 DIAGNOSIS — R2689 Other abnormalities of gait and mobility: Secondary | ICD-10-CM | POA: Diagnosis not present

## 2023-11-17 DIAGNOSIS — R262 Difficulty in walking, not elsewhere classified: Secondary | ICD-10-CM | POA: Diagnosis not present

## 2023-11-18 DIAGNOSIS — R262 Difficulty in walking, not elsewhere classified: Secondary | ICD-10-CM | POA: Diagnosis not present

## 2023-11-18 DIAGNOSIS — R2689 Other abnormalities of gait and mobility: Secondary | ICD-10-CM | POA: Diagnosis not present

## 2023-11-19 DIAGNOSIS — I480 Paroxysmal atrial fibrillation: Secondary | ICD-10-CM | POA: Diagnosis not present

## 2023-11-19 DIAGNOSIS — R2689 Other abnormalities of gait and mobility: Secondary | ICD-10-CM | POA: Diagnosis not present

## 2023-11-19 DIAGNOSIS — R262 Difficulty in walking, not elsewhere classified: Secondary | ICD-10-CM | POA: Diagnosis not present

## 2023-11-22 DIAGNOSIS — R262 Difficulty in walking, not elsewhere classified: Secondary | ICD-10-CM | POA: Diagnosis not present

## 2023-11-22 DIAGNOSIS — R2689 Other abnormalities of gait and mobility: Secondary | ICD-10-CM | POA: Diagnosis not present

## 2023-11-23 DIAGNOSIS — R2689 Other abnormalities of gait and mobility: Secondary | ICD-10-CM | POA: Diagnosis not present

## 2023-11-23 DIAGNOSIS — R262 Difficulty in walking, not elsewhere classified: Secondary | ICD-10-CM | POA: Diagnosis not present

## 2023-11-24 DIAGNOSIS — R2689 Other abnormalities of gait and mobility: Secondary | ICD-10-CM | POA: Diagnosis not present

## 2023-11-24 DIAGNOSIS — R262 Difficulty in walking, not elsewhere classified: Secondary | ICD-10-CM | POA: Diagnosis not present

## 2023-11-25 DIAGNOSIS — R262 Difficulty in walking, not elsewhere classified: Secondary | ICD-10-CM | POA: Diagnosis not present

## 2023-11-25 DIAGNOSIS — Z7901 Long term (current) use of anticoagulants: Secondary | ICD-10-CM | POA: Diagnosis not present

## 2023-11-25 DIAGNOSIS — R2689 Other abnormalities of gait and mobility: Secondary | ICD-10-CM | POA: Diagnosis not present

## 2023-11-25 DIAGNOSIS — I480 Paroxysmal atrial fibrillation: Secondary | ICD-10-CM | POA: Diagnosis not present

## 2023-11-26 DIAGNOSIS — R2689 Other abnormalities of gait and mobility: Secondary | ICD-10-CM | POA: Diagnosis not present

## 2023-11-26 DIAGNOSIS — R262 Difficulty in walking, not elsewhere classified: Secondary | ICD-10-CM | POA: Diagnosis not present

## 2023-11-29 DIAGNOSIS — Z7901 Long term (current) use of anticoagulants: Secondary | ICD-10-CM | POA: Diagnosis not present

## 2023-12-02 DIAGNOSIS — K589 Irritable bowel syndrome without diarrhea: Secondary | ICD-10-CM | POA: Diagnosis not present

## 2023-12-02 DIAGNOSIS — I25118 Atherosclerotic heart disease of native coronary artery with other forms of angina pectoris: Secondary | ICD-10-CM | POA: Diagnosis not present

## 2023-12-02 DIAGNOSIS — R42 Dizziness and giddiness: Secondary | ICD-10-CM | POA: Diagnosis not present

## 2023-12-02 DIAGNOSIS — I495 Sick sinus syndrome: Secondary | ICD-10-CM | POA: Diagnosis not present

## 2023-12-02 DIAGNOSIS — E1142 Type 2 diabetes mellitus with diabetic polyneuropathy: Secondary | ICD-10-CM | POA: Diagnosis not present

## 2023-12-02 DIAGNOSIS — I509 Heart failure, unspecified: Secondary | ICD-10-CM | POA: Diagnosis not present

## 2023-12-02 DIAGNOSIS — Z950 Presence of cardiac pacemaker: Secondary | ICD-10-CM | POA: Diagnosis not present

## 2023-12-02 DIAGNOSIS — F411 Generalized anxiety disorder: Secondary | ICD-10-CM | POA: Diagnosis not present

## 2023-12-02 DIAGNOSIS — I480 Paroxysmal atrial fibrillation: Secondary | ICD-10-CM | POA: Diagnosis not present

## 2023-12-02 DIAGNOSIS — S32020D Wedge compression fracture of second lumbar vertebra, subsequent encounter for fracture with routine healing: Secondary | ICD-10-CM | POA: Diagnosis not present

## 2023-12-02 DIAGNOSIS — I110 Hypertensive heart disease with heart failure: Secondary | ICD-10-CM | POA: Diagnosis not present

## 2023-12-02 DIAGNOSIS — F3340 Major depressive disorder, recurrent, in remission, unspecified: Secondary | ICD-10-CM | POA: Diagnosis not present

## 2023-12-03 DIAGNOSIS — R251 Tremor, unspecified: Secondary | ICD-10-CM | POA: Diagnosis not present

## 2023-12-03 DIAGNOSIS — Z7901 Long term (current) use of anticoagulants: Secondary | ICD-10-CM | POA: Diagnosis not present

## 2023-12-03 DIAGNOSIS — I480 Paroxysmal atrial fibrillation: Secondary | ICD-10-CM | POA: Diagnosis not present

## 2023-12-03 DIAGNOSIS — R42 Dizziness and giddiness: Secondary | ICD-10-CM | POA: Diagnosis not present

## 2023-12-03 DIAGNOSIS — E1142 Type 2 diabetes mellitus with diabetic polyneuropathy: Secondary | ICD-10-CM | POA: Diagnosis not present

## 2023-12-03 DIAGNOSIS — Z7984 Long term (current) use of oral hypoglycemic drugs: Secondary | ICD-10-CM | POA: Diagnosis not present

## 2023-12-06 DIAGNOSIS — I480 Paroxysmal atrial fibrillation: Secondary | ICD-10-CM | POA: Diagnosis not present

## 2023-12-06 DIAGNOSIS — F411 Generalized anxiety disorder: Secondary | ICD-10-CM | POA: Diagnosis not present

## 2023-12-06 DIAGNOSIS — R197 Diarrhea, unspecified: Secondary | ICD-10-CM | POA: Diagnosis not present

## 2023-12-06 DIAGNOSIS — R42 Dizziness and giddiness: Secondary | ICD-10-CM | POA: Diagnosis not present

## 2023-12-08 DIAGNOSIS — Z7901 Long term (current) use of anticoagulants: Secondary | ICD-10-CM | POA: Diagnosis not present

## 2023-12-15 DIAGNOSIS — Z7901 Long term (current) use of anticoagulants: Secondary | ICD-10-CM | POA: Diagnosis not present

## 2023-12-21 DIAGNOSIS — I350 Nonrheumatic aortic (valve) stenosis: Secondary | ICD-10-CM | POA: Diagnosis not present

## 2023-12-21 DIAGNOSIS — E782 Mixed hyperlipidemia: Secondary | ICD-10-CM | POA: Diagnosis not present

## 2023-12-21 DIAGNOSIS — I482 Chronic atrial fibrillation, unspecified: Secondary | ICD-10-CM | POA: Diagnosis not present

## 2023-12-21 DIAGNOSIS — I517 Cardiomegaly: Secondary | ICD-10-CM | POA: Diagnosis not present

## 2023-12-21 DIAGNOSIS — I495 Sick sinus syndrome: Secondary | ICD-10-CM | POA: Diagnosis not present

## 2023-12-21 DIAGNOSIS — I6523 Occlusion and stenosis of bilateral carotid arteries: Secondary | ICD-10-CM | POA: Diagnosis not present

## 2023-12-21 DIAGNOSIS — I071 Rheumatic tricuspid insufficiency: Secondary | ICD-10-CM | POA: Diagnosis not present

## 2023-12-21 DIAGNOSIS — I272 Pulmonary hypertension, unspecified: Secondary | ICD-10-CM | POA: Diagnosis not present

## 2023-12-21 DIAGNOSIS — I959 Hypotension, unspecified: Secondary | ICD-10-CM | POA: Diagnosis not present

## 2023-12-21 DIAGNOSIS — I10 Essential (primary) hypertension: Secondary | ICD-10-CM | POA: Diagnosis not present

## 2023-12-21 DIAGNOSIS — I340 Nonrheumatic mitral (valve) insufficiency: Secondary | ICD-10-CM | POA: Diagnosis not present

## 2023-12-21 DIAGNOSIS — I25118 Atherosclerotic heart disease of native coronary artery with other forms of angina pectoris: Secondary | ICD-10-CM | POA: Diagnosis not present

## 2023-12-22 DIAGNOSIS — Z7901 Long term (current) use of anticoagulants: Secondary | ICD-10-CM | POA: Diagnosis not present

## 2023-12-27 DIAGNOSIS — Z7901 Long term (current) use of anticoagulants: Secondary | ICD-10-CM | POA: Diagnosis not present

## 2023-12-29 DIAGNOSIS — Z7901 Long term (current) use of anticoagulants: Secondary | ICD-10-CM | POA: Diagnosis not present

## 2023-12-31 DIAGNOSIS — R0982 Postnasal drip: Secondary | ICD-10-CM | POA: Diagnosis not present

## 2023-12-31 DIAGNOSIS — R059 Cough, unspecified: Secondary | ICD-10-CM | POA: Diagnosis not present

## 2023-12-31 DIAGNOSIS — J309 Allergic rhinitis, unspecified: Secondary | ICD-10-CM | POA: Diagnosis not present

## 2023-12-31 DIAGNOSIS — Z7901 Long term (current) use of anticoagulants: Secondary | ICD-10-CM | POA: Diagnosis not present

## 2023-12-31 DIAGNOSIS — I480 Paroxysmal atrial fibrillation: Secondary | ICD-10-CM | POA: Diagnosis not present

## 2023-12-31 DIAGNOSIS — Z853 Personal history of malignant neoplasm of breast: Secondary | ICD-10-CM | POA: Diagnosis not present

## 2023-12-31 DIAGNOSIS — R0602 Shortness of breath: Secondary | ICD-10-CM | POA: Diagnosis not present

## 2023-12-31 DIAGNOSIS — J069 Acute upper respiratory infection, unspecified: Secondary | ICD-10-CM | POA: Diagnosis not present

## 2024-01-05 DIAGNOSIS — Z7901 Long term (current) use of anticoagulants: Secondary | ICD-10-CM | POA: Diagnosis not present

## 2024-01-07 DIAGNOSIS — Z7901 Long term (current) use of anticoagulants: Secondary | ICD-10-CM | POA: Diagnosis not present

## 2024-01-10 DIAGNOSIS — Z7901 Long term (current) use of anticoagulants: Secondary | ICD-10-CM | POA: Diagnosis not present

## 2024-01-17 DIAGNOSIS — Z7901 Long term (current) use of anticoagulants: Secondary | ICD-10-CM | POA: Diagnosis not present

## 2024-01-24 DIAGNOSIS — M6281 Muscle weakness (generalized): Secondary | ICD-10-CM | POA: Diagnosis not present

## 2024-01-24 DIAGNOSIS — Z7901 Long term (current) use of anticoagulants: Secondary | ICD-10-CM | POA: Diagnosis not present

## 2024-01-25 DIAGNOSIS — M6281 Muscle weakness (generalized): Secondary | ICD-10-CM | POA: Diagnosis not present

## 2024-01-26 DIAGNOSIS — M6281 Muscle weakness (generalized): Secondary | ICD-10-CM | POA: Diagnosis not present

## 2024-01-27 DIAGNOSIS — M6281 Muscle weakness (generalized): Secondary | ICD-10-CM | POA: Diagnosis not present

## 2024-01-28 DIAGNOSIS — M6281 Muscle weakness (generalized): Secondary | ICD-10-CM | POA: Diagnosis not present

## 2024-01-31 DIAGNOSIS — M6281 Muscle weakness (generalized): Secondary | ICD-10-CM | POA: Diagnosis not present

## 2024-01-31 DIAGNOSIS — Z7901 Long term (current) use of anticoagulants: Secondary | ICD-10-CM | POA: Diagnosis not present

## 2024-02-01 DIAGNOSIS — M6281 Muscle weakness (generalized): Secondary | ICD-10-CM | POA: Diagnosis not present

## 2024-02-02 DIAGNOSIS — M6281 Muscle weakness (generalized): Secondary | ICD-10-CM | POA: Diagnosis not present

## 2024-02-03 DIAGNOSIS — M6281 Muscle weakness (generalized): Secondary | ICD-10-CM | POA: Diagnosis not present

## 2024-02-03 DIAGNOSIS — M549 Dorsalgia, unspecified: Secondary | ICD-10-CM | POA: Diagnosis not present

## 2024-02-03 DIAGNOSIS — R4689 Other symptoms and signs involving appearance and behavior: Secondary | ICD-10-CM | POA: Diagnosis not present

## 2024-02-03 DIAGNOSIS — S32020D Wedge compression fracture of second lumbar vertebra, subsequent encounter for fracture with routine healing: Secondary | ICD-10-CM | POA: Diagnosis not present

## 2024-02-03 DIAGNOSIS — I480 Paroxysmal atrial fibrillation: Secondary | ICD-10-CM | POA: Diagnosis not present

## 2024-02-04 DIAGNOSIS — M6281 Muscle weakness (generalized): Secondary | ICD-10-CM | POA: Diagnosis not present

## 2024-02-04 DIAGNOSIS — M545 Low back pain, unspecified: Secondary | ICD-10-CM | POA: Diagnosis not present

## 2024-02-04 DIAGNOSIS — M546 Pain in thoracic spine: Secondary | ICD-10-CM | POA: Diagnosis not present

## 2024-02-05 DIAGNOSIS — N390 Urinary tract infection, site not specified: Secondary | ICD-10-CM | POA: Diagnosis not present

## 2024-02-07 DIAGNOSIS — M545 Low back pain, unspecified: Secondary | ICD-10-CM | POA: Diagnosis not present

## 2024-02-07 DIAGNOSIS — Z7901 Long term (current) use of anticoagulants: Secondary | ICD-10-CM | POA: Diagnosis not present

## 2024-02-07 DIAGNOSIS — M6281 Muscle weakness (generalized): Secondary | ICD-10-CM | POA: Diagnosis not present

## 2024-02-07 DIAGNOSIS — S32010A Wedge compression fracture of first lumbar vertebra, initial encounter for closed fracture: Secondary | ICD-10-CM | POA: Diagnosis not present

## 2024-02-08 DIAGNOSIS — M6281 Muscle weakness (generalized): Secondary | ICD-10-CM | POA: Diagnosis not present

## 2024-02-09 DIAGNOSIS — M6281 Muscle weakness (generalized): Secondary | ICD-10-CM | POA: Diagnosis not present

## 2024-02-10 DIAGNOSIS — M6281 Muscle weakness (generalized): Secondary | ICD-10-CM | POA: Diagnosis not present

## 2024-02-11 DIAGNOSIS — M6281 Muscle weakness (generalized): Secondary | ICD-10-CM | POA: Diagnosis not present

## 2024-02-14 DIAGNOSIS — Z7901 Long term (current) use of anticoagulants: Secondary | ICD-10-CM | POA: Diagnosis not present

## 2024-02-21 DIAGNOSIS — Z7901 Long term (current) use of anticoagulants: Secondary | ICD-10-CM | POA: Diagnosis not present

## 2024-02-22 DIAGNOSIS — I495 Sick sinus syndrome: Secondary | ICD-10-CM | POA: Diagnosis not present

## 2024-02-25 DIAGNOSIS — L603 Nail dystrophy: Secondary | ICD-10-CM | POA: Diagnosis not present

## 2024-02-25 DIAGNOSIS — I739 Peripheral vascular disease, unspecified: Secondary | ICD-10-CM | POA: Diagnosis not present

## 2024-02-25 DIAGNOSIS — L602 Onychogryphosis: Secondary | ICD-10-CM | POA: Diagnosis not present

## 2024-02-28 DIAGNOSIS — Z7901 Long term (current) use of anticoagulants: Secondary | ICD-10-CM | POA: Diagnosis not present

## 2024-03-06 DIAGNOSIS — Z7901 Long term (current) use of anticoagulants: Secondary | ICD-10-CM | POA: Diagnosis not present

## 2024-03-13 DIAGNOSIS — Z7901 Long term (current) use of anticoagulants: Secondary | ICD-10-CM | POA: Diagnosis not present

## 2024-03-17 DIAGNOSIS — Z23 Encounter for immunization: Secondary | ICD-10-CM | POA: Diagnosis not present

## 2024-03-20 DIAGNOSIS — Z7901 Long term (current) use of anticoagulants: Secondary | ICD-10-CM | POA: Diagnosis not present

## 2024-03-27 DIAGNOSIS — Z7901 Long term (current) use of anticoagulants: Secondary | ICD-10-CM | POA: Diagnosis not present

## 2024-03-28 DIAGNOSIS — I25118 Atherosclerotic heart disease of native coronary artery with other forms of angina pectoris: Secondary | ICD-10-CM | POA: Diagnosis not present

## 2024-03-28 DIAGNOSIS — I272 Pulmonary hypertension, unspecified: Secondary | ICD-10-CM | POA: Diagnosis not present

## 2024-03-28 DIAGNOSIS — I495 Sick sinus syndrome: Secondary | ICD-10-CM | POA: Diagnosis not present

## 2024-03-28 DIAGNOSIS — I10 Essential (primary) hypertension: Secondary | ICD-10-CM | POA: Diagnosis not present

## 2024-03-28 DIAGNOSIS — I482 Chronic atrial fibrillation, unspecified: Secondary | ICD-10-CM | POA: Diagnosis not present

## 2024-03-28 DIAGNOSIS — I517 Cardiomegaly: Secondary | ICD-10-CM | POA: Diagnosis not present

## 2024-03-28 DIAGNOSIS — E782 Mixed hyperlipidemia: Secondary | ICD-10-CM | POA: Diagnosis not present

## 2024-03-28 DIAGNOSIS — I350 Nonrheumatic aortic (valve) stenosis: Secondary | ICD-10-CM | POA: Diagnosis not present

## 2024-03-28 NOTE — Progress Notes (Signed)
 Established Patient Visit   Chief Complaint:No chief complaint on file.  Date of Service: 03/28/2024 Date of Birth: [birth year] PCP: Marylynn Verneita Kay, MD [address] [place] [address]  History of Present Illness:   Leah Holmes is a 89 y.o.female patient that presents for 4 month f/u.    PMH: CAD with diffuse 3v disease by cath 2012 with critical LAD stenosis not amenable to PCI SSS s/p single chamber pacer 2017 H/O atrial fibrillation Rate control: atenolol  Anticoagulation: warfarin INR target 2.0-3.0 Moderate to severe MR with moderate mitral stenosis Moderate to severe AS  Moderate to severe TR Bilateral carotid artery stenosis, 1-39% bilaterally on 10/25/22 carotid US  Pulmonary HTN HTN  History of Present Illness Leah Holmes is a 89 year old female with hypertension who presents with weakness and low energy.  She has been experiencing weakness and trembling, which she attributes to 'old age.' Her low energy levels have persisted since February, coinciding with adjustments in her blood pressure medication regimen. She is currently taking atenolol  and warfarin. Her daughter recalls a recent reduction in the dosage of atenolol  and lisinopril  to once daily. Despite these changes, her energy levels remain low.  She has a history of breast cancer and experiences occasional soreness in the breast where she had cancer, near her pacemaker. The last pacemaker check was in March, and it is expected to last about four more years. No constant pain is reported, only occasional soreness.  She experiences intermittent shortness of breath, which her daughter confirms. She has a history of valvular heart issues and high pressures in the lungs.  She uses patches on her back for a compression fracture, applying them in the morning and removing them at night. Her blood pressures were previously high but have been low since February.   Past Medical and Surgical History  Past  Medical History Past Medical History:  Diagnosis Date  . Atrial fibrillation (CMS/HHS-HCC) 2012  . Breast cancer (CMS/HHS-HCC) 04/22/2018   left, pT2, N0, ER/PR positive, histologic grade 1. HER-2/neu not overexpressed. Failed antiestrogen trial.Family decided against change in agent. (12/2020)  . CHF (congestive heart failure) (CMS/HHS-HCC)   . Colon polyp 2003  . Coronary artery disease   . COVID-19 11/2019  . Diabetes mellitus type 2, uncomplicated (CMS/HHS-HCC)   . Dyspnea   . Dysrhythmia   . Environmental and seasonal allergies   . GERD (gastroesophageal reflux disease)   . Hx of lumpectomy 02/2020  . Hyperlipidemia   . Hypertension   . IBS (irritable bowel syndrome)   . LBBB (left bundle branch block)   . Mild concentric left ventricular hypertrophy (LVH)   . Multiple rib fractures 03/15/2018  . PONV (postoperative nausea and vomiting)   . Presence of permanent cardiac pacemaker   . Tremor   . VHD (valvular heart disease)     Past Surgical History She has a past surgical history that includes Cardiac catheterization (2012); PCI and stent placement; Mastectomy partial / lumpectomy (Left, 04/22/2018); Incisional Biopsy Breast (Left, 03/31/2018, 03-19-20); Cholecystectomy (03/2012); history of pacemaker insertion (01/28/2016); Colonoscopy (2003); egd (2003); abdominal adhesion surgery; Abdominal hysterectomy; and Incisional Biopsy Breast (Right, late 1980's/early 1990's).   Medications and Allergies  Current Medications  Current Outpatient Medications on File Prior to Visit  Medication Sig Dispense Refill  . acetaminophen  (TYLENOL ) 500 MG tablet Take 500 mg by mouth 4 (four) times daily    . ALPRAZolam  (XANAX ) 0.25 MG tablet Take 0.25 mg by mouth 2 (two) times daily    .  atenoloL  (TENORMIN ) 25 MG tablet Take 1 tablet by mouth twice daily 180 tablet 3  . cetirizine (ZYRTEC) 10 MG tablet Take 10 mg by mouth once daily    . divalproex  (DEPAKOTE  SPRINKLE) 125 mg DR capsule     .  FUROsemide  (LASIX ) 20 MG tablet Take 20 mg by mouth once daily    . isosorbide  mononitrate (IMDUR ) 60 MG ER tablet Take 1 tablet by mouth twice daily (Patient taking differently: Take 60 mg by mouth once daily) 180 tablet 3  . lidocaine -menthol  4-4 % PtMd Apply topically    . lisinopriL  (ZESTRIL ) 40 MG tablet Take 1 tablet (40 mg total) by mouth once daily 90 tablet 3  . meclizine  (ANTIVERT ) 25 mg tablet Take 1 tablet (25 mg total) by mouth 3 (three) times daily as needed    . metFORMIN (GLUCOPHAGE) 500 MG tablet Take 500 mg by mouth daily with breakfast    . multivitamin tablet Take 1 tablet by mouth once daily    . nitroGLYcerin  (NITROSTAT ) 0.4 MG SL tablet DISSOLVE ONE TABLET UNDER THE TONGUE EVERY 5 MINUTES AS NEEDED FOR CHEST PAIN.  MAY TAKE UP TO A TOTAL OF 3 DOSES IN 15 MINUTES 25 tablet 1  . polyethylene glycol (MIRALAX ) powder Take 17 g by mouth 2 (two) times daily Mix in 4-8ounces of fluid prior to taking.    . potassium chloride  (KLOR-CON  M10) 10 mEq ER tablet Take 10 mEq by mouth once daily    . propylene glycoL (SYSTANE BALANCE) 0.6 % ophthalmic drops Place 1 drop into both eyes as needed for Dry Eyes    . QUEtiapine  (SEROQUEL ) 25 MG tablet Take 25 mg by mouth at bedtime    . traMADoL  (ULTRAM ) 50 mg tablet Take 50 mg by mouth every 6 (six) hours as needed    . warfarin (COUMADIN ) 1 MG tablet Take 1 mg by mouth as directed    . warfarin (COUMADIN ) 2 MG tablet TAKE 2 TABLETS BY MOUTH NIGHTLY 180 tablet 0  . warfarin (COUMADIN ) 4 MG tablet Take 4 mg by mouth as directed    . warfarin (COUMADIN ) 5 MG tablet as directed    . calcium  carbonate (TUMS E-X) 300 mg (750 mg) chewable tablet Take 600 mg of elemental by mouth at bedtime    . HEMORRHOIDAL, PHENYLEPH-COCOA, 0.25-88.44 % suppository Place rectally as needed    . HEMORRHOIDAL,PE-MIN OIL-PETRO, 0.25-14-74.9 % Oint rectal ointment Apply topically 4 (four) times daily as needed    . ondansetron  (ZOFRAN -ODT) 4 MG disintegrating tablet  Take 4 mg by mouth every 6 (six) hours as needed     No current facility-administered medications on file prior to visit.    Allergies: Codeine, Penicillins, Amlodipine , Clonidine, Clonidine (pf), Gabapentin , Lipitor [atorvastatin], Losartan , Morphine, Reglan [metoclopramide], Valacyclovir , Hydralazine , and Spironolactone   Social and Family History  Social History  reports that she has never smoked. She has never used smokeless tobacco. She reports that she does not drink alcohol  and does not use drugs.  Family History Family History  Problem Relation Name Age of Onset  . No Known Problems Mother    . Heart disease Father    . Breast cancer Sister    . Breast cancer Other    . Breast cancer Maternal Aunt      Review of Systems   Review of Systems  Constitutional:        Denies weight gain  Respiratory:  Positive for shortness of breath.   Cardiovascular:  Positive  for chest pain. Negative for palpitations and leg swelling.  Neurological:  Positive for dizziness. Negative for loss of consciousness.  Endo/Heme/Allergies:  Does not bruise/bleed easily.      Physical Examination   Vitals:BP 110/60   Pulse 65   Ht 162.6 cm (5' 4)   Wt 53.1 kg (117 lb)   SpO2 97%   BMI 20.08 kg/m  Ht:162.6 cm (5' 4) Wt:53.1 kg (117 lb) ADJ:Anib surface area is 1.55 meters squared. Body mass index is 20.08 kg/m.  Physical Exam Vitals reviewed.  Constitutional:      General: She is not in acute distress.    Appearance: Normal appearance.  Cardiovascular:     Rate and Rhythm: Normal rate. Rhythm irregularly irregular.     Heart sounds: Murmur heard.     Systolic murmur is present.  Pulmonary:     Effort: Pulmonary effort is normal. No respiratory distress.     Breath sounds: Normal breath sounds.  Musculoskeletal:     Right lower leg: No edema.     Left lower leg: No edema.  Neurological:     Mental Status: She is alert.       Data & Results   No results for input(s):  CHOLTOTAL, HDL, LDLCALC, LDLDIRECT, LDL, VLDL, TRIG in the last 73719 hours.  No results for input(s): NA, K, BUN, CREATININE, CO2, GLUCOSE, GLUF, ALT, AST, TBILI, ALB in the last 73719 hours.  No results for input(s): WBC, HGB, HCT, MCV, PLT in the last 73719 hours.  Recent Labs    08/17/22 1331 09/15/22 1330 10/13/22 1329  INR 2.1 2.5 2.3        Assessment   89 y.o. female with  Encounter Diagnoses  Name Primary?  . SSS (sick sinus syndrome) (CMS/HHS-HCC) Yes  . LVH (left ventricular hypertrophy)   . Chronic a-fib (CMS/HHS-HCC)   . Chronic pulmonary hypertension (CMS/HHS-HCC)   . Benign essential hypertension   . Coronary artery disease of native artery of native heart with stable angina pectoris   . Hyperlipidemia, mixed   . Moderate aortic stenosis     Plan   Assessment & Plan Moderate aortic stenosis and moderate mitral insufficiency Valvular issues may contribute to shortness of breath.  Pulmonary hypertension High pressures in the lungs could contribute to shortness of breath.  Essential hypertension Blood pressure has been very low since February, likely due to overmedication. Symptoms of weakness and low energy may be related to excessive blood pressure medication. - Discontinue amlodipine  to potentially improve energy levels.  Pacemaker status Pacemaker has approximately four and a half years of battery life remaining as of the last check in March.  Compression fracture Compression fracture managed with back patches applied in the morning and removed at night. - Continue current management with back patches.   Return in about 6 months (around 09/26/2024).   Attestation Statement:   I personally performed the service, non-incident to. (WP)   SABINA CUSTOVIC, DO

## 2024-03-30 DIAGNOSIS — K649 Unspecified hemorrhoids: Secondary | ICD-10-CM | POA: Diagnosis not present

## 2024-03-30 DIAGNOSIS — R1030 Lower abdominal pain, unspecified: Secondary | ICD-10-CM | POA: Diagnosis not present

## 2024-03-31 DIAGNOSIS — R100 Acute abdomen: Secondary | ICD-10-CM | POA: Diagnosis not present

## 2024-04-03 DIAGNOSIS — Z7901 Long term (current) use of anticoagulants: Secondary | ICD-10-CM | POA: Diagnosis not present

## 2024-04-10 DIAGNOSIS — Z7901 Long term (current) use of anticoagulants: Secondary | ICD-10-CM | POA: Diagnosis not present

## 2024-04-13 DIAGNOSIS — L309 Dermatitis, unspecified: Secondary | ICD-10-CM | POA: Diagnosis not present

## 2024-04-17 DIAGNOSIS — Z7901 Long term (current) use of anticoagulants: Secondary | ICD-10-CM | POA: Diagnosis not present

## 2024-04-18 DIAGNOSIS — E119 Type 2 diabetes mellitus without complications: Secondary | ICD-10-CM | POA: Diagnosis not present

## 2024-04-18 DIAGNOSIS — D649 Anemia, unspecified: Secondary | ICD-10-CM | POA: Diagnosis not present

## 2024-04-18 DIAGNOSIS — E1142 Type 2 diabetes mellitus with diabetic polyneuropathy: Secondary | ICD-10-CM | POA: Diagnosis not present

## 2024-04-18 DIAGNOSIS — Z13228 Encounter for screening for other metabolic disorders: Secondary | ICD-10-CM | POA: Diagnosis not present

## 2024-04-20 DIAGNOSIS — H906 Mixed conductive and sensorineural hearing loss, bilateral: Secondary | ICD-10-CM | POA: Diagnosis not present

## 2024-04-20 DIAGNOSIS — G8929 Other chronic pain: Secondary | ICD-10-CM | POA: Diagnosis not present

## 2024-04-20 DIAGNOSIS — I480 Paroxysmal atrial fibrillation: Secondary | ICD-10-CM | POA: Diagnosis not present

## 2024-04-20 DIAGNOSIS — B372 Candidiasis of skin and nail: Secondary | ICD-10-CM | POA: Diagnosis not present

## 2024-04-20 DIAGNOSIS — N182 Chronic kidney disease, stage 2 (mild): Secondary | ICD-10-CM | POA: Diagnosis not present

## 2024-04-20 DIAGNOSIS — E1122 Type 2 diabetes mellitus with diabetic chronic kidney disease: Secondary | ICD-10-CM | POA: Diagnosis not present

## 2024-04-24 DIAGNOSIS — Z7901 Long term (current) use of anticoagulants: Secondary | ICD-10-CM | POA: Diagnosis not present

## 2024-05-01 DIAGNOSIS — Z7901 Long term (current) use of anticoagulants: Secondary | ICD-10-CM | POA: Diagnosis not present

## 2024-05-08 DIAGNOSIS — Z7901 Long term (current) use of anticoagulants: Secondary | ICD-10-CM | POA: Diagnosis not present

## 2024-05-15 DIAGNOSIS — Z7901 Long term (current) use of anticoagulants: Secondary | ICD-10-CM | POA: Diagnosis not present

## 2024-05-18 DIAGNOSIS — Z7901 Long term (current) use of anticoagulants: Secondary | ICD-10-CM | POA: Diagnosis not present

## 2024-05-20 ENCOUNTER — Emergency Department

## 2024-05-20 ENCOUNTER — Inpatient Hospital Stay: Admit: 2024-05-20 | Discharge: 2024-05-20 | Disposition: A | Attending: Internal Medicine

## 2024-05-20 ENCOUNTER — Emergency Department: Admit: 2024-05-20 | Discharge: 2024-05-20 | Disposition: A | Attending: Internal Medicine

## 2024-05-20 ENCOUNTER — Inpatient Hospital Stay
Admission: EM | Admit: 2024-05-20 | Discharge: 2024-05-26 | DRG: 071 | Disposition: A | Discharge status: Skilled Nursing Facility | Source: Skilled Nursing Facility | Attending: Hospitalist | Admitting: Hospitalist

## 2024-05-20 ENCOUNTER — Other Ambulatory Visit: Payer: Self-pay

## 2024-05-20 DIAGNOSIS — R410 Disorientation, unspecified: Secondary | ICD-10-CM | POA: Diagnosis not present

## 2024-05-20 DIAGNOSIS — Z7901 Long term (current) use of anticoagulants: Secondary | ICD-10-CM | POA: Diagnosis not present

## 2024-05-20 DIAGNOSIS — E1169 Type 2 diabetes mellitus with other specified complication: Secondary | ICD-10-CM | POA: Diagnosis present

## 2024-05-20 DIAGNOSIS — I639 Cerebral infarction, unspecified: Secondary | ICD-10-CM | POA: Diagnosis not present

## 2024-05-20 DIAGNOSIS — I6782 Cerebral ischemia: Secondary | ICD-10-CM | POA: Diagnosis not present

## 2024-05-20 DIAGNOSIS — E871 Hypo-osmolality and hyponatremia: Secondary | ICD-10-CM | POA: Diagnosis present

## 2024-05-20 DIAGNOSIS — Z8616 Personal history of COVID-19: Secondary | ICD-10-CM | POA: Diagnosis not present

## 2024-05-20 DIAGNOSIS — I509 Heart failure, unspecified: Secondary | ICD-10-CM | POA: Diagnosis not present

## 2024-05-20 DIAGNOSIS — R791 Abnormal coagulation profile: Secondary | ICD-10-CM

## 2024-05-20 DIAGNOSIS — I482 Chronic atrial fibrillation, unspecified: Secondary | ICD-10-CM | POA: Diagnosis present

## 2024-05-20 DIAGNOSIS — R930 Abnormal findings on diagnostic imaging of skull and head, not elsewhere classified: Secondary | ICD-10-CM | POA: Diagnosis not present

## 2024-05-20 DIAGNOSIS — J90 Pleural effusion, not elsewhere classified: Secondary | ICD-10-CM

## 2024-05-20 DIAGNOSIS — Z87891 Personal history of nicotine dependence: Secondary | ICD-10-CM | POA: Diagnosis not present

## 2024-05-20 DIAGNOSIS — R509 Fever, unspecified: Secondary | ICD-10-CM

## 2024-05-20 DIAGNOSIS — I10 Essential (primary) hypertension: Secondary | ICD-10-CM | POA: Diagnosis not present

## 2024-05-20 DIAGNOSIS — G9340 Encephalopathy, unspecified: Secondary | ICD-10-CM | POA: Diagnosis not present

## 2024-05-20 DIAGNOSIS — E1142 Type 2 diabetes mellitus with diabetic polyneuropathy: Secondary | ICD-10-CM | POA: Diagnosis present

## 2024-05-20 DIAGNOSIS — J9811 Atelectasis: Secondary | ICD-10-CM | POA: Diagnosis not present

## 2024-05-20 DIAGNOSIS — Z136 Encounter for screening for cardiovascular disorders: Secondary | ICD-10-CM | POA: Diagnosis not present

## 2024-05-20 DIAGNOSIS — I700 Atherosclerosis of aorta: Secondary | ICD-10-CM | POA: Diagnosis present

## 2024-05-20 DIAGNOSIS — I517 Cardiomegaly: Secondary | ICD-10-CM | POA: Diagnosis not present

## 2024-05-20 DIAGNOSIS — E7849 Other hyperlipidemia: Secondary | ICD-10-CM | POA: Diagnosis present

## 2024-05-20 DIAGNOSIS — R456 Violent behavior: Secondary | ICD-10-CM | POA: Diagnosis not present

## 2024-05-20 DIAGNOSIS — I513 Intracardiac thrombosis, not elsewhere classified: Secondary | ICD-10-CM | POA: Diagnosis not present

## 2024-05-20 DIAGNOSIS — I4891 Unspecified atrial fibrillation: Secondary | ICD-10-CM | POA: Diagnosis not present

## 2024-05-20 DIAGNOSIS — I2510 Atherosclerotic heart disease of native coronary artery without angina pectoris: Secondary | ICD-10-CM | POA: Diagnosis present

## 2024-05-20 DIAGNOSIS — Z7984 Long term (current) use of oral hypoglycemic drugs: Secondary | ICD-10-CM | POA: Diagnosis not present

## 2024-05-20 DIAGNOSIS — I110 Hypertensive heart disease with heart failure: Secondary | ICD-10-CM | POA: Diagnosis present

## 2024-05-20 DIAGNOSIS — Z681 Body mass index (BMI) 19 or less, adult: Secondary | ICD-10-CM | POA: Diagnosis not present

## 2024-05-20 DIAGNOSIS — I495 Sick sinus syndrome: Secondary | ICD-10-CM | POA: Diagnosis present

## 2024-05-20 DIAGNOSIS — I5032 Chronic diastolic (congestive) heart failure: Secondary | ICD-10-CM | POA: Diagnosis present

## 2024-05-20 DIAGNOSIS — K219 Gastro-esophageal reflux disease without esophagitis: Secondary | ICD-10-CM | POA: Diagnosis present

## 2024-05-20 DIAGNOSIS — Z1152 Encounter for screening for COVID-19: Secondary | ICD-10-CM | POA: Diagnosis not present

## 2024-05-20 DIAGNOSIS — Z66 Do not resuscitate: Secondary | ICD-10-CM | POA: Diagnosis present

## 2024-05-20 DIAGNOSIS — Z880 Allergy status to penicillin: Secondary | ICD-10-CM | POA: Diagnosis not present

## 2024-05-20 DIAGNOSIS — F29 Unspecified psychosis not due to a substance or known physiological condition: Secondary | ICD-10-CM | POA: Diagnosis not present

## 2024-05-20 DIAGNOSIS — R4182 Altered mental status, unspecified: Secondary | ICD-10-CM | POA: Diagnosis not present

## 2024-05-20 DIAGNOSIS — F411 Generalized anxiety disorder: Secondary | ICD-10-CM | POA: Diagnosis present

## 2024-05-20 DIAGNOSIS — G9341 Metabolic encephalopathy: Secondary | ICD-10-CM | POA: Diagnosis present

## 2024-05-20 DIAGNOSIS — F0390 Unspecified dementia without behavioral disturbance: Secondary | ICD-10-CM | POA: Diagnosis not present

## 2024-05-20 DIAGNOSIS — R569 Unspecified convulsions: Secondary | ICD-10-CM | POA: Diagnosis not present

## 2024-05-20 DIAGNOSIS — Z950 Presence of cardiac pacemaker: Secondary | ICD-10-CM | POA: Diagnosis not present

## 2024-05-20 DIAGNOSIS — R636 Underweight: Secondary | ICD-10-CM | POA: Diagnosis present

## 2024-05-20 DIAGNOSIS — Z79899 Other long term (current) drug therapy: Secondary | ICD-10-CM | POA: Diagnosis not present

## 2024-05-20 HISTORY — DX: Sick sinus syndrome: I49.5

## 2024-05-20 LAB — CBC WITH DIFFERENTIAL/PLATELET
Abs Immature Granulocytes: 0.08 K/uL — ABNORMAL HIGH (ref 0.00–0.07)
Basophils Absolute: 0 K/uL (ref 0.0–0.1)
Basophils Relative: 1 %
Eosinophils Absolute: 0.1 K/uL (ref 0.0–0.5)
Eosinophils Relative: 1 %
HCT: 40.6 % (ref 36.0–46.0)
Hemoglobin: 13.6 g/dL (ref 12.0–15.0)
Immature Granulocytes: 1 %
Lymphocytes Relative: 20 %
Lymphs Abs: 1.5 K/uL (ref 0.7–4.0)
MCH: 32 pg (ref 26.0–34.0)
MCHC: 33.5 g/dL (ref 30.0–36.0)
MCV: 95.5 fL (ref 80.0–100.0)
Monocytes Absolute: 0.6 K/uL (ref 0.1–1.0)
Monocytes Relative: 8 %
Neutro Abs: 5.5 K/uL (ref 1.7–7.7)
Neutrophils Relative %: 69 %
Platelets: 244 K/uL (ref 150–400)
RBC: 4.25 MIL/uL (ref 3.87–5.11)
RDW: 12.7 % (ref 11.5–15.5)
WBC: 7.8 K/uL (ref 4.0–10.5)
nRBC: 0 % (ref 0.0–0.2)

## 2024-05-20 LAB — URINALYSIS, W/ REFLEX TO CULTURE (INFECTION SUSPECTED)
Bacteria, UA: NONE SEEN
Bilirubin Urine: NEGATIVE
Glucose, UA: NEGATIVE mg/dL
Hgb urine dipstick: NEGATIVE
Ketones, ur: NEGATIVE mg/dL
Nitrite: NEGATIVE
Protein, ur: NEGATIVE mg/dL
Specific Gravity, Urine: 1.015 (ref 1.005–1.030)
pH: 6 (ref 5.0–8.0)

## 2024-05-20 LAB — RESPIRATORY PANEL BY PCR

## 2024-05-20 LAB — ECHOCARDIOGRAM COMPLETE
AR max vel: 0.33 cm2
AV Area VTI: 0.29 cm2
AV Area mean vel: 0.31 cm2
AV Mean grad: 38.5 mmHg
AV Peak grad: 68.2 mmHg
Ao pk vel: 4.13 m/s
Area-P 1/2: 4.21 cm2
Height: 66 in
MV VTI: 0.96 cm2
S' Lateral: 2.6 cm
Weight: 2091.2 [oz_av]

## 2024-05-20 LAB — COMPREHENSIVE METABOLIC PANEL WITH GFR
ALT: 13 U/L (ref 0–44)
AST: 21 U/L (ref 15–41)
Albumin: 4.2 g/dL (ref 3.5–5.0)
Alkaline Phosphatase: 59 U/L (ref 38–126)
Anion gap: 11 (ref 5–15)
BUN: 14 mg/dL (ref 8–23)
CO2: 25 mmol/L (ref 22–32)
Calcium: 9.4 mg/dL (ref 8.9–10.3)
Chloride: 98 mmol/L (ref 98–111)
Creatinine, Ser: 0.57 mg/dL (ref 0.44–1.00)
GFR, Estimated: >60 mL/min (ref >60)
Glucose, Bld: 141 mg/dL — ABNORMAL HIGH (ref 70–99)
Potassium: 4.2 mmol/L (ref 3.5–5.1)
Sodium: 134 mmol/L — ABNORMAL LOW (ref 135–145)
Total Bilirubin: 0.7 mg/dL (ref 0.0–1.2)
Total Protein: 7.1 g/dL (ref 6.5–8.1)

## 2024-05-20 LAB — GLUCOSE, CAPILLARY: Glucose-Capillary: 110 mg/dL — ABNORMAL HIGH (ref 70–99)

## 2024-05-20 LAB — APTT: aPTT: 40 s — ABNORMAL HIGH (ref 24–36)

## 2024-05-20 LAB — VALPROIC ACID LEVEL: Valproic Acid Lvl: 10 ug/mL — ABNORMAL LOW (ref 50–100)

## 2024-05-20 LAB — RESP PANEL BY RT-PCR (RSV, FLU A&B, COVID)  RVPGX2
Influenza A by PCR: NEGATIVE
Influenza B by PCR: NEGATIVE
Resp Syncytial Virus by PCR: NEGATIVE
SARS Coronavirus 2 by RT PCR: NEGATIVE

## 2024-05-20 LAB — MAGNESIUM: Magnesium: 1.9 mg/dL (ref 1.7–2.4)

## 2024-05-20 LAB — TROPONIN T, HIGH SENSITIVITY: Troponin T High Sensitivity: <15 ng/L (ref 0–19)

## 2024-05-20 LAB — MRSA NEXT GEN BY PCR, NASAL: MRSA by PCR Next Gen: NOT DETECTED

## 2024-05-20 LAB — HEPARIN LEVEL (UNFRACTIONATED): Heparin Unfractionated: 0.62 [IU]/mL (ref 0.30–0.70)

## 2024-05-20 LAB — AMMONIA: Ammonia: 24 umol/L (ref 9–35)

## 2024-05-20 LAB — PROTIME-INR
INR: 1.1 (ref 0.8–1.2)
Prothrombin Time: 14.9 s (ref 11.4–15.2)

## 2024-05-20 MED ORDER — LORAZEPAM 2 MG/ML IJ SOLN
2.0000 mg | Freq: Once | INTRAMUSCULAR | Status: AC
  Administered 2024-05-20: 2 mg via INTRAVENOUS
  Filled 2024-05-20: qty 1

## 2024-05-20 MED ORDER — LORAZEPAM 2 MG/ML IJ SOLN: 1.0000 mg | Freq: Once | INTRAMUSCULAR | Status: DC | PRN

## 2024-05-20 MED ORDER — CHLORHEXIDINE GLUCONATE CLOTH 2 % EX PADS
6.0000 | MEDICATED_PAD | Freq: Every day | CUTANEOUS | Status: DC
  Administered 2024-05-20: 6 via TOPICAL

## 2024-05-20 MED ORDER — IOHEXOL 300 MG/ML  SOLN
100.0000 mL | Freq: Once | INTRAMUSCULAR | Status: AC | PRN
  Administered 2024-05-20: 100 mL via INTRAVENOUS

## 2024-05-20 MED ORDER — OLANZAPINE 10 MG IM SOLR
5.0000 mg | Freq: Once | INTRAMUSCULAR | Status: AC
  Administered 2024-05-20: 5 mg via INTRAMUSCULAR
  Filled 2024-05-20: qty 10

## 2024-05-20 MED ORDER — SODIUM CHLORIDE 0.9 % IV SOLN
1.0000 g | Freq: Once | INTRAVENOUS | Status: AC
  Administered 2024-05-20: 1 g via INTRAVENOUS
  Filled 2024-05-20: qty 10

## 2024-05-20 MED ORDER — OLANZAPINE 10 MG IM SOLR
5.0000 mg | Freq: Once | INTRAMUSCULAR | Status: AC | PRN
  Administered 2024-05-22: 5 mg via INTRAMUSCULAR
  Filled 2024-05-20: qty 10

## 2024-05-20 MED ORDER — SENNOSIDES-DOCUSATE SODIUM 8.6-50 MG PO TABS: 1.0000 | ORAL_TABLET | Freq: Every evening | ORAL | Status: DC | PRN

## 2024-05-20 MED ORDER — ACETAMINOPHEN 325 MG PO TABS
650.0000 mg | ORAL_TABLET | Freq: Four times a day (QID) | ORAL | Status: DC | PRN
  Administered 2024-05-23 – 2024-05-24 (×4): 650 mg via ORAL
  Administered 2024-05-25: 325 mg via ORAL
  Administered 2024-05-26: 650 mg via ORAL
  Filled 2024-05-20 (×6): qty 2

## 2024-05-20 MED ORDER — ACETAMINOPHEN 650 MG RE SUPP: 650.0000 mg | Freq: Four times a day (QID) | RECTAL | Status: DC | PRN

## 2024-05-20 MED ORDER — HEPARIN BOLUS VIA INFUSION
3600.0000 [IU] | Freq: Once | INTRAVENOUS | Status: AC
  Administered 2024-05-20: 3600 [IU] via INTRAVENOUS
  Filled 2024-05-20: qty 3600

## 2024-05-20 MED ORDER — SODIUM CHLORIDE 0.9% FLUSH
3.0000 mL | Freq: Two times a day (BID) | INTRAVENOUS | Status: DC
  Administered 2024-05-21 (×2): 3 mL via INTRAVENOUS

## 2024-05-20 MED ORDER — ORAL CARE MOUTH RINSE: 15.0000 mL | OROMUCOSAL | Status: DC | PRN

## 2024-05-20 MED ORDER — HEPARIN (PORCINE) 25000 UT/250ML-% IV SOLN
900.0000 [IU]/h | INTRAVENOUS | Status: DC
  Administered 2024-05-20: 900 [IU]/h via INTRAVENOUS
  Filled 2024-05-20 (×2): qty 250

## 2024-05-20 MED ORDER — MAGNESIUM SULFATE 2 GM/50ML IV SOLN
2.0000 g | Freq: Once | INTRAVENOUS | Status: AC
  Administered 2024-05-20: 2 g via INTRAVENOUS
  Filled 2024-05-20: qty 50

## 2024-05-20 MED ORDER — LACTATED RINGERS IV SOLN: INTRAVENOUS | Status: AC

## 2024-05-20 NOTE — Consult Note (Cosign Needed Addendum)
 NAME:  Leah Holmes, MRN:  [id number], DOB:  09-Jun-1927, LOS: 0 ADMISSION DATE:  05/20/2024, CONSULTATION DATE:  05/20/24 REFERRING MD:  Dr. Morene Bathe, CHIEF COMPLAINT:  altered mental status   History of Present Illness:  Leah Holmes is a 89 year old female with a medical history significant for hypertension, hyperlipidemia, Type II Diabetes Mellitus, Atrial Fibrillation on warfarin, Sick Sinus Syndrome s/p pacemaker placement who presented to [facility] ED with concern for worsening confusion and altered mental status. Per her daughter at bedside, the patient has had worsening confused since 12/5 that began around 2 pm. Her baseline is alert and oriented, completing ADLs by herself, although uses a wheelchair. Her daughter reports that the patient did not endorse any abdominal pain, chest pain, dyspnea or dysuria.  ED Course Vitals on Arrival: Temp 100.1, BP 154/43, HR 86, RR 24, SpO2 100%  Pertinent Labs/Diagnostics: Na: 134 Glucose: 141 WBC: 7.8 INR: 1.1 APTT: 40 Troponin: <15 Respiratory Panel (flu/covid/rsv): negative UA: non-infectious appearance Head CT: no acute intracranial abnormalities with remote lacunar infarct. CT Abdomen/Pelvis: left atrial thrombus with no acute findings.  Medications Administered: LR infusion Zyprexa  5mg  x 1 IV Ativan  2mg  x 2 Heparin  gtt Magnesium  Sulfate 2g  PCCM consulted for refractory agitation and possible precedex gtt.   Pertinent Medical History  Hypertension Hyperlipidemia Type II Diabetes Mellitus CHF Atrial Fibrillation Sick Sinus Syndrome s/p Pacemaker Placement Tremors  Significant Hospital Events: Including procedures, antibiotic start and stop dates in addition to other pertinent events   12/6: Presented to [facility] ED with altered mental status and acute agitation refractory to zyprexa . Given IV Ativan  4mg  total, now resting comfortably. PCCM consulted for possible precedex gtt.   Interim History / Subjective:  See  above listed under significant hospital events.  Micro Data:  12/6 Respiratory PCR (flu/covid/rsv)>> negative 12/6 Respiratory Viral Panel >> 12/6 Blood cultures x 2 >> 12/6 MRSA PCR >> 12/6 Urine Culture >>  Objective    Blood pressure (!) 176/69, pulse 67, temperature (!) 100.9 F (38.3 C), temperature source Rectal, resp. rate 20, height 5' 6 (1.676 m), weight 59.3 kg, SpO2 100%.       No intake or output data in the 24 hours ending 05/20/24 1738 Filed Weights   05/20/24 0801  Weight: 59.3 kg    Examination: General: well nourished, well developed, resting in bed in NAD HENT: atraumatic, normocephalic, supple, no jvd Lungs: clear to auscultation bilaterally, no respiratory distress Cardiovascular: irregularly irregular, S1, S2, systolic murmur, no rubs/gallops Abdomen: soft, non-tender, non-distended, no rebound/guarding, bowel sounds x 4 Extremities: warm, dry, normal bulk and tone, radial and distal pulses 2+, no edema Neuro: did not assess a&o as patient was sedated and asleep, no focal neuro deficits, PERRL GU: deferred  Resolved problem list   Assessment and Plan   #Acute Metabolic Encephalopathy #Acute Agitation Hx: Anxiety, Depression 12/6 Head CT: no acute intracranial abnormality; remote lacunar infarct 12/6 Chest/Abd/Pelvis CT: left atrial thrombus with no acute abnormalities. - Control pain and agitation as able - Currently resting and calm following IV Ativan  - Consider starting precedex gtt if agitation persists - Correct metabolic derangements as able - Encourage healthy sleep/wake cycle - Supportive care - Ammonia level pending - ICU delirium precautions - Hold home meds for now - Consider MRI once established if pacemaker is compatible  #Atrial Fibrillation (on Warfarin) #Sick Sinus Syndrome s/p Pacemaker Placement #Hypertension #Left Atrial Appendage Hx: CAD s/p cath in 2012, SSS s/p pacemaker placement, hypertension, hyperlipidemia,  a-fib, CHF 12/6 ECHO: LVEF 55-60% with regional wall motion abnormalities and diastolic dysfunction, mildly enlarged RV, severely dilated left atrium, mild/moderate MR, mild/moderate TR.  - Continuous cardiac monitoring - Not currently requiring vasopressors for blood pressure support - Continue Heparin  gtt for afib - Prn labetalol  - EKG as indicated - Cardiology consulted - Hold home meds for now (atenolol , lisinopril , warfarin)  #Mild Hyponatremia - Strict I/O - Trend BMP - Continue IVF - Avoid nephrotoxins as able - Ensure adequate renal perfusion - Electrolyte replacement protocol as indicated - Pharmacy to assist with replacement prn  #Subtherapeutic INR - Monitor INR prn - Target INR: 2.0-3.0  #Type II Diabetes Mellitus - ICU hypo/hyperglycemia protocol - Continue Metformin 500mg   #Suspected Sepsis s/t possible UTI - Trend WBC and monitor fever curve - Febrile on arrival to ED with unremarkable WBC - Respiratory panel (flu/covid/rsv): negative - MRSA PCR negative - Respiratory Viral Panel pending - UA with trace leukocytes; urine culture pending - Blood cultures pending - Abx: started on Zosyn - Narrow ABX pending culture and sensitivities   Labs   CBC: Recent Labs  Lab 05/20/24 0820  WBC 7.8  NEUTROABS 5.5  HGB 13.6  HCT 40.6  MCV 95.5  PLT 244    Basic Metabolic Panel: Recent Labs  Lab 05/20/24 0820  NA 134*  K 4.2  CL 98  CO2 25  GLUCOSE 141*  BUN 14  CREATININE 0.57  CALCIUM  9.4  MG 1.9   GFR: Estimated Creatinine Clearance: 39.4 mL/min (by C-G formula based on SCr of 0.57 mg/dL). Recent Labs  Lab 05/20/24 0820  WBC 7.8    Liver Function Tests: Recent Labs  Lab 05/20/24 0820  AST 21  ALT 13  ALKPHOS 59  BILITOT 0.7  PROT 7.1  ALBUMIN 4.2   No results for input(s): LIPASE, AMYLASE in the last 168 hours. No results for input(s): AMMONIA in the last 168 hours.  ABG No results found for: PHART, PCO2ART, PO2ART,  HCO3, TCO2, ACIDBASEDEF, O2SAT   Coagulation Profile: Recent Labs  Lab 05/20/24 0820  INR 1.1    Cardiac Enzymes: No results for input(s): CKTOTAL, CKMB, CKMBINDEX, TROPONINI in the last 168 hours.  HbA1C: Hgb A1c MFr Bld  Date/Time Value Ref Range Status  09/07/2022 02:33 PM 6.9 (H) 4.6 - 6.5 % Final    Comment:    Glycemic Control Guidelines for People with Diabetes:Non Diabetic:  <6%Goal of Therapy: <7%Additional Action Suggested:  >8%   04/08/2022 02:37 PM 6.9 (H) 4.6 - 6.5 % Final    Comment:    Glycemic Control Guidelines for People with Diabetes:Non Diabetic:  <6%Goal of Therapy: <7%Additional Action Suggested:  >8%     CBG: No results for input(s): GLUCAP in the last 168 hours.  Review of Systems:   Unable to assess as patient is sedated and sleeping.   Past Medical History:  She,  has a past medical history of Atrial fibrillation (HCC) (2012), Breast cancer (HCC) (04/2018), CHF (congestive heart failure) (HCC), Colon polyp (2003), Compression fracture of L2 lumbar vertebra (HCC), Coronary artery disease, COVID-19 virus infection (12/25/2019), Diabetes mellitus without complication (HCC), Dyspnea, Dysrhythmia, Environmental and seasonal allergies, GERD (gastroesophageal reflux disease), Hyperlipidemia, Hypertension, Hypertensive urgency (03/10/2021), Irritable bowel syndrome, Multiple rib fractures (03/15/2018), Personal history of radiation therapy, Polyp of colon, PONV (postoperative nausea and vomiting), Presence of permanent cardiac pacemaker, Sick sinus syndrome (HCC), Tremor, Tremors of nervous system, and Ulcer.   Surgical History:   Past Surgical History:  Procedure  Laterality Date   ABDOMINAL ADHESION SURGERY     ABDOMINAL HYSTERECTOMY     APPENDECTOMY     BREAST BIOPSY Right late 80s/early 90s   benign   BREAST BIOPSY Left 03/31/2018   us  bx done with Dr. Dessa, invasive ductal carcinoma   BREAST LUMPECTOMY Left 04/22/2018   Procedure:  BREAST LUMPECTOMY;  Surgeon: Dessa Reyes ORN, MD;  Location: [facility] ORS;  Service: General;  Laterality: Left;   BREAST LUMPECTOMY Left 04/22/2018   [facility], negative LN, clear margins   BREAST LUMPECTOMY Left 2020   positive, done in office   BREAST SURGERY Right    biopsy    CESAREAN SECTION     x 2   CHOLECYSTECTOMY  03/2012   Dr Wonda   COLONOSCOPY  2003   Dr Bedelia in [place]    fibroid tumor     INTRAMEDULLARY (IM) NAIL INTERTROCHANTERIC Right 10/25/2022   Procedure: INTRAMEDULLARY (IM) NAIL INTERTROCHANTERIC;  Surgeon: Ernie Cough, MD;  Location: [facility] OR;  Service: Orthopedics;  Laterality: Right;   PACEMAKER INSERTION N/A 01/28/2016   Procedure: INSERTION PACEMAKER;  Surgeon: Marsa Dooms, MD;  Location: [facility] ORS;  Service: Cardiovascular;  Laterality: N/A;   SENTINEL NODE BIOPSY Left 04/22/2018   Procedure: SENTINEL NODE BIOPSY;  Surgeon: Dessa Reyes ORN, MD;  Location: [facility] ORS;  Service: General;  Laterality: Left;   UPPER GI ENDOSCOPY  2003   Dr Bedelia in [place]      Social History:   reports that she has never smoked. She has quit using smokeless tobacco.  Her smokeless tobacco use included snuff. She reports that she does not drink alcohol  and does not use drugs.   Family History:  Her family history includes Breast cancer in her cousin and sister; Breast cancer (age of onset: 37) in an other family member; Breast cancer (age of onset: 16) in her maternal aunt; Cancer (age of onset: 44) in her sister.   Allergies Allergies  Allergen Reactions   Codeine Anaphylaxis   Penicillins Anaphylaxis   Clonidine Derivatives Other (See Comments)    Unknown reaction   Cozaar  [Losartan ] Other (See Comments)    Myalgias    Lipitor [Atorvastatin] Other (See Comments)    Unknown reaction   Ms Contin [Morphine] Other (See Comments)    Unknown reaction   Neurontin  [Gabapentin ] Nausea Only   Reglan [Metoclopramide] Other (See Comments)    Tremors    Valtrex   [Valacyclovir ] Other (See Comments)    Tremors    Aldactone  [Spironolactone ] Itching   Apresoline  [Hydralazine ] Anxiety   Norvasc  [Amlodipine ] Itching    Does cause itching. Pt does take PRN if needed.     Home Medications  Prior to Admission medications   Medication Sig Start Date End Date Taking? Authorizing Provider  acetaminophen  (TYLENOL ) 500 MG tablet Take 1,000 mg by mouth every 6 (six) hours as needed for moderate pain, fever or headache.   Yes [provider]  ALPRAZolam  (XANAX ) 0.25 MG tablet Take 0.25 mg by mouth at bedtime.   Yes [provider]  amLODipine  (NORVASC ) 10 MG tablet Take 1 tablet (10 mg total) by mouth daily. Patient taking differently: Take 10 mg by mouth daily as needed (sBP > 140 after all medications have been taken). 09/07/22  Yes Marylynn Verneita CROME, MD  atenolol  (TENORMIN ) 25 MG tablet Take 25 mg by mouth 2 (two) times daily. 07/01/21  Yes [provider]  cetirizine (ZYRTEC) 10 MG tablet Take 10 mg by mouth daily.   Yes  [provider]  divalproex  (DEPAKOTE  SPRINKLE) 125 MG capsule Take 125 mg by mouth at bedtime.   Yes [provider]  furosemide  (LASIX ) 20 MG tablet Take 20 mg by mouth daily.   Yes [provider]  isosorbide  mononitrate (IMDUR ) 60 MG 24 hr tablet Take 60 mg by mouth daily.   Yes [provider]  Lidocaine  HCl 4 % PTCH Apply 1 patch topically daily.   Yes [provider]  lisinopril  (ZESTRIL ) 40 MG tablet Take 1 tablet (40 mg total) by mouth 2 (two) times daily. Patient taking differently: Take 40 mg by mouth daily. 09/07/22  Yes Marylynn Verneita CROME, MD  meclizine  (ANTIVERT ) 25 MG tablet Take 1 tablet by mouth three times daily as needed Patient taking differently: Take 25 mg by mouth 3 (three) times daily as needed for dizziness. 07/01/22  Yes Tullo, Teresa L, MD  metFORMIN (GLUCOPHAGE) 500 MG tablet Take 500 mg by mouth daily.   Yes [provider]  Multiple Vitamin  (MULTIVITAMIN WITH MINERALS) TABS tablet Take 1 tablet by mouth daily. 11/03/22  Yes Sheikh, Omair Latif, DO  nitroGLYCERIN  (NITROSTAT ) 0.4 MG SL tablet Place 0.4 mg under the tongue every 5 (five) minutes as needed for chest pain.   Yes [provider]  polyethylene glycol (MIRALAX  / GLYCOLAX ) 17 g packet Take 17 g by mouth 2 (two) times daily. 11/02/22  Yes Sheikh, Omair Latif, DO  potassium chloride  (KLOR-CON  M) 10 MEQ tablet Take 10 mEq by mouth daily.   Yes [provider]  QUEtiapine  (SEROQUEL ) 25 MG tablet Take 0.5 tablets (12.5 mg total) by mouth at bedtime. 11/02/22  Yes Sheikh, Omair Latif, DO  senna-docusate (SENOKOT-S) 8.6-50 MG tablet Take 1 tablet by mouth 2 (two) times daily. 11/02/22  Yes Sheikh, Omair Latif, DO  warfarin (COUMADIN ) 5 MG tablet Take 5 mg by mouth daily.   Yes [provider]  glucose blood (ONE TOUCH ULTRA TEST) test strip USE 1 STRIP TO CHECK GLUCOSE TWICE DAILY  Dx code: E11.9 05/29/19   Marylynn Verneita CROME, MD  Lancets [facility] DELICA PLUS [place]) MISC Place 2 Devices inside cheek daily. DX Code E11.9. 09/01/21   Marylynn Verneita CROME, MD     Critical care time: 45 minutes      Ahniya Mitchum, PA-C Pulmonary/Critical Care PCCM Team Contact Info: [phone]

## 2024-05-20 NOTE — H&P (Addendum)
 History and Physical    Leah Holmes FMW:[id number] DOB: May 25, 1927 DOA: 05/20/2024  DOS: the patient was seen and examined on 05/20/2024  PCP: Patient, No Pcp Per   Patient coming from: SNF  I have personally briefly reviewed patient's old medical records in [facility] and CareEverywhere  HPI:   Leah Holmes is a 89 y.o. year old female with medical history of hypertension, hyperlipidemia, type 2 diabetes, atrial fibrillation on warfarin, sick sinus syndrome status post pacemaker presenting from her nursing facility for worsening confusion.   Pt unable to give any history. Per daughter and nursing pt has been confused since yesterday. She became confused starting 2 pm and it progressively gotten worse. At baseline pt uses wheelchair but can use the bathroom by herself. No fevers or other symptoms of URI by nursing facility. Her last INR was 1.2 at the facility, and she has been receiving 4.5 mg warfarin daily.  Per the facility the confusion started yesterday afternoon but progressively got worse.  They also noted around nighttime that her right arm was turning blue.   On arrival to the ED patient was noted to be HDS stable.  Lab work and imaging obtained.  CBC unremarkable.  CMP with mild hyponatremia but otherwise unremarkable.  Troponin negative.  Respiratory panel negative for COVID, flu, RSV.  UA without signs of infection as no WBC or bacteria seen.  INR subtherapeutic at 1.1.  CT head did not show any acute findings but did show remote lacunar infarct.  CT abdomen pelvis with contrast performed concern for left atrial thrombus but otherwise no acute findings.  Given her presentation, [facility] contacted for admission.  Review of Systems: unable to review all systems due to the inability of the patient to answer questions.   Past Medical History:  Diagnosis Date   Atrial fibrillation (HCC) 2012   Breast cancer (HCC) 04/2018   IDC of left breast    CHF  (congestive heart failure) (HCC)    Colon polyp 2003   Compression fracture of L2 lumbar vertebra (HCC)    Coronary artery disease    COVID-19 virus infection 12/25/2019   Diabetes mellitus without complication (HCC)    Dyspnea    Dysrhythmia    atrial fib   Environmental and seasonal allergies    GERD (gastroesophageal reflux disease)    Hyperlipidemia    Hypertension    Hypertensive urgency 03/10/2021   Irritable bowel syndrome    Multiple rib fractures 03/15/2018   Personal history of radiation therapy    Polyp of colon    PONV (postoperative nausea and vomiting)    with ether, not recently woke up in surgery once   Presence of permanent cardiac pacemaker    Sick sinus syndrome (HCC)    Tremor    Tremors of nervous system    Ulcer     Past Surgical History:  Procedure Laterality Date   ABDOMINAL ADHESION SURGERY     ABDOMINAL HYSTERECTOMY     APPENDECTOMY     BREAST BIOPSY Right late 80s/early 90s   benign   BREAST BIOPSY Left 03/31/2018   us  bx done with Dr. Dessa, invasive ductal carcinoma   BREAST LUMPECTOMY Left 04/22/2018   Procedure: BREAST LUMPECTOMY;  Surgeon: Dessa Reyes ORN, MD;  Location: [facility] ORS;  Service: General;  Laterality: Left;   BREAST LUMPECTOMY Left 04/22/2018   [facility], negative LN, clear margins   BREAST LUMPECTOMY Left 2020   positive, done in office  BREAST SURGERY Right    biopsy    CESAREAN SECTION     x 2   CHOLECYSTECTOMY  03/2012   Dr Wonda   COLONOSCOPY  2003   Dr Bedelia in [place]    fibroid tumor     INTRAMEDULLARY (IM) NAIL INTERTROCHANTERIC Right 10/25/2022   Procedure: INTRAMEDULLARY (IM) NAIL INTERTROCHANTERIC;  Surgeon: Ernie Cough, MD;  Location[facility] OR;  Service: Orthopedics;  Laterality: Right;   PACEMAKER INSERTION N/A 01/28/2016   Procedure: INSERTION PACEMAKER;  Surgeon: Marsa Dooms, MD;  Location: [facility] ORS;  Service: Cardiovascular;  Laterality: N/A;   SENTINEL NODE BIOPSY Left 04/22/2018    Procedure: SENTINEL NODE BIOPSY;  Surgeon: Dessa Reyes ORN, MD;  Location: [facility] ORS;  Service: General;  Laterality: Left;   UPPER GI ENDOSCOPY  2003   Dr Bedelia in [place]      Allergies  Allergen Reactions   Codeine Anaphylaxis   Penicillins Anaphylaxis   Clonidine Derivatives Other (See Comments)    Unknown reaction   Cozaar  [Losartan ] Other (See Comments)    Myalgias    Lipitor [Atorvastatin] Other (See Comments)    Unknown reaction   Ms Contin [Morphine] Other (See Comments)    Unknown reaction   Neurontin  [Gabapentin ] Nausea Only   Reglan [Metoclopramide] Other (See Comments)    Tremors    Valtrex  [Valacyclovir ] Other (See Comments)    Tremors    Aldactone  [Spironolactone ] Itching   Apresoline  [Hydralazine ] Anxiety   Norvasc  [Amlodipine ] Itching    Does cause itching. Pt does take PRN if needed.    Family History  Problem Relation Age of Onset   Breast cancer Sister    Cancer Sister 30       breast   Breast cancer Maternal Aunt 14   Breast cancer Cousin    Breast cancer Other 20    Prior to Admission medications   Medication Sig Start Date End Date Taking? Authorizing Provider  acetaminophen  (TYLENOL ) 500 MG tablet Take 1,000 mg by mouth every 6 (six) hours as needed for moderate pain, fever or headache.   Yes [provider]  ALPRAZolam  (XANAX ) 0.25 MG tablet Take 0.25 mg by mouth at bedtime.   Yes [provider]  amLODipine  (NORVASC ) 10 MG tablet Take 1 tablet (10 mg total) by mouth daily. Patient taking differently: Take 10 mg by mouth daily as needed (sBP > 140 after all medications have been taken). 09/07/22  Yes Marylynn Verneita CROME, MD  atenolol  (TENORMIN ) 25 MG tablet Take 25 mg by mouth 2 (two) times daily. 07/01/21  Yes [provider]  cetirizine (ZYRTEC) 10 MG tablet Take 10 mg by mouth daily.   Yes [provider]  divalproex  (DEPAKOTE  SPRINKLE) 125 MG capsule Take 125 mg by mouth at bedtime.   Yes [provider]  furosemide  (LASIX ) 20 MG tablet Take 20 mg by mouth daily.   Yes [provider]  isosorbide  mononitrate (IMDUR ) 60 MG 24 hr tablet Take 60 mg by mouth daily.   Yes [provider]  Lidocaine  HCl 4 % PTCH Apply 1 patch topically daily.   Yes [provider]  lisinopril  (ZESTRIL ) 40 MG tablet Take 1 tablet (40 mg total) by mouth 2 (two) times daily. Patient taking differently: Take 40 mg by mouth daily. 09/07/22  Yes Marylynn Verneita CROME, MD  meclizine  (ANTIVERT ) 25 MG tablet Take 1 tablet by mouth three times daily as needed Patient taking differently: Take 25 mg by mouth 3 (three) times daily as  needed for dizziness. 07/01/22  Yes Tullo, Teresa L, MD  metFORMIN (GLUCOPHAGE) 500 MG tablet Take 500 mg by mouth daily.   Yes [provider]  Multiple Vitamin (MULTIVITAMIN WITH MINERALS) TABS tablet Take 1 tablet by mouth daily. 11/03/22  Yes Sheikh, Omair Latif, DO  nitroGLYCERIN  (NITROSTAT ) 0.4 MG SL tablet Place 0.4 mg under the tongue every 5 (five) minutes as needed for chest pain.   Yes [provider]  polyethylene glycol (MIRALAX  / GLYCOLAX ) 17 g packet Take 17 g by mouth 2 (two) times daily. 11/02/22  Yes Sheikh, Omair Latif, DO  potassium chloride  (KLOR-CON  M) 10 MEQ tablet Take 10 mEq by mouth daily.   Yes [provider]  QUEtiapine  (SEROQUEL ) 25 MG tablet Take 0.5 tablets (12.5 mg total) by mouth at bedtime. 11/02/22  Yes Sheikh, Omair Latif, DO  senna-docusate (SENOKOT-S) 8.6-50 MG tablet Take 1 tablet by mouth 2 (two) times daily. 11/02/22  Yes Sheikh, Omair Latif, DO  warfarin (COUMADIN ) 5 MG tablet Take 5 mg by mouth daily.   Yes [provider]  glucose blood (ONE TOUCH ULTRA TEST) test strip USE 1 STRIP TO CHECK GLUCOSE TWICE DAILY  Dx code: E11.9 05/29/19   Marylynn Verneita CROME, MD  Lancets [facility] DELICA PLUS [place]) MISC Place 2 Devices inside cheek daily. DX Code E11.9. 09/01/21   Marylynn Verneita CROME, MD    Social  History:  reports that she has never smoked. She has quit using smokeless tobacco.  Her smokeless tobacco use included snuff. She reports that she does not drink alcohol  and does not use drugs. Lives at [facility] Tobacco- Denies use. EtOH- Denies use.  Illicit drug use- denies use.  IADLs/ADLs-Needs assistance   Physical Exam: Vitals:   05/20/24 0900 05/20/24 1112 05/20/24 1250 05/20/24 1253  BP: (!) 184/45 (!) 194/58 (!) 176/69   Pulse: 72 63 67   Resp: (!) 24 (!) 21 20   Temp:    98.9 F (37.2 C)  TempSrc:    Axillary  SpO2: 100% 99% 100%   Weight:      Height:         Gen: Agitated moving in bed HENT: Dry MM CV: Irregularly irregular, 4+ systolic murmur present Resp: CTAB Abd: No TTP MSK: Decreased muscle bulk Skin: No rashes seen on examined skin Neuro: Unable to assess given patient is moving randomly.  She is moving all her extremities.  No facial droop is noted.  She is talking incoherently. Psych: Unable to assess   Labs on Admission: I have personally reviewed following labs and imaging studies  CBC: Recent Labs  Lab 05/20/24 0820  WBC 7.8  NEUTROABS 5.5  HGB 13.6  HCT 40.6  MCV 95.5  PLT 244   Basic Metabolic Panel: Recent Labs  Lab 05/20/24 0820  NA 134*  K 4.2  CL 98  CO2 25  GLUCOSE 141*  BUN 14  CREATININE 0.57  CALCIUM  9.4   GFR: Estimated Creatinine Clearance: 39.4 mL/min (by C-G formula based on SCr of 0.57 mg/dL). Liver Function Tests: Recent Labs  Lab 05/20/24 0820  AST 21  ALT 13  ALKPHOS 59  BILITOT 0.7  PROT 7.1  ALBUMIN 4.2   No results for input(s): LIPASE, AMYLASE in the last 168 hours. No results for input(s): AMMONIA in the last 168 hours. Coagulation Profile: Recent Labs  Lab 05/20/24 0820  INR 1.1   Cardiac Enzymes: No results for input(s): CKTOTAL, CKMB, CKMBINDEX, TROPONINI, TROPONINIHS in the last  168 hours. BNP (last 3 results) No results for input(s): BNP in the last 8760  hours. HbA1C: No results for input(s): HGBA1C in the last 72 hours. CBG: No results for input(s): GLUCAP in the last 168 hours. Lipid Profile: No results for input(s): CHOL, HDL, LDLCALC, TRIG, CHOLHDL, LDLDIRECT in the last 72 hours. Thyroid  Function Tests: No results for input(s): TSH, T4TOTAL, FREET4, T3FREE, THYROIDAB in the last 72 hours. Anemia Panel: No results for input(s): VITAMINB12, FOLATE, FERRITIN, TIBC, IRON, RETICCTPCT in the last 72 hours. Urine analysis:    Component Value Date/Time   COLORURINE YELLOW (A) 05/20/2024 0840   APPEARANCEUR CLEAR (A) 05/20/2024 0840   APPEARANCEUR Clear 10/05/2014 2114   LABSPEC 1.015 05/20/2024 0840   LABSPEC 1.027 10/05/2014 2114   PHURINE 6.0 05/20/2024 0840   GLUCOSEU NEGATIVE 05/20/2024 0840   GLUCOSEU NEGATIVE 04/08/2022 1438   HGBUR NEGATIVE 05/20/2024 0840   BILIRUBINUR NEGATIVE 05/20/2024 0840   BILIRUBINUR negative 01/19/2017 1531   BILIRUBINUR Negative 10/05/2014 2114   KETONESUR NEGATIVE 05/20/2024 0840   PROTEINUR NEGATIVE 05/20/2024 0840   UROBILINOGEN 0.2 04/08/2022 1438   NITRITE NEGATIVE 05/20/2024 0840   LEUKOCYTESUR TRACE (A) 05/20/2024 0840   LEUKOCYTESUR 2+ 10/05/2014 2114    Radiological Exams on Admission: I have personally reviewed images CT CHEST ABDOMEN PELVIS W CONTRAST Result Date: 05/20/2024 EXAM: CT CHEST, ABDOMEN AND PELVIS WITH CONTRAST 05/20/2024 11:50:50 AM TECHNIQUE: CT of the chest, abdomen and pelvis was performed with the administration of intravenous contrast. 100 mL of iohexol  (OMNIPAQUE ) 300 MG/ML solution was administered. Multiplanar reformatted images are provided for review. Automated exposure control, iterative reconstruction, and/or weight based adjustment of the mA/kV was utilized to reduce the radiation dose to as low as reasonably achievable. COMPARISON: 11/01/2022 status post cholecystectomy. CLINICAL HISTORY: ams, fever FINDINGS: CHEST:  MEDIASTINUM AND LYMPH NODES: Aortic and mitral valve calcifications are noted. Probable thrombus is noted in the left atrial appendage. Coronary artery calcifications are noted. Pericardium is unremarkable. The central airways are clear. No mediastinal, hilar or axillary lymphadenopathy. LUNGS AND PLEURA: Small bilateral pleural effusions are noted with minimal adjacent sub-some atelectasis. No focal consolidation or pulmonary edema. No pneumothorax. ABDOMEN AND PELVIS: LIVER: The liver is unremarkable. GALLBLADDER AND BILE DUCTS: Status post cholecystectomy. No biliary ductal dilatation. SPLEEN: No acute abnormality. PANCREAS: No acute abnormality. ADRENAL GLANDS: No acute abnormality. KIDNEYS, URETERS AND BLADDER: No stones in the kidneys or ureters. No hydronephrosis. No perinephric or periureteral stranding. Urinary bladder is unremarkable. GI AND BOWEL: Stomach demonstrates no acute abnormality. There is no bowel obstruction. REPRODUCTIVE ORGANS: No acute abnormality. PERITONEUM AND RETROPERITONEUM: No ascites. No free air. VASCULATURE: Aorta is normal in caliber. Aortic atherosclerosis. ABDOMINAL AND PELVIS LYMPH NODES: No lymphadenopathy. BONES AND SOFT TISSUES: Old L1 and L2 compression fractures are noted. No acute osseous abnormality. No focal soft tissue abnormality. IMPRESSION: 1. Probable thrombus in the left atrial appendage. 2. Small bilateral pleural effusions with minimal adjacent subsegmental atelectasis. Electronically signed by: Lynwood Seip MD 05/20/2024 12:28 PM EST RP Workstation: [id number]   CT HEAD WO CONTRAST ( ) Result Date: 05/20/2024 EXAM: CT HEAD WITHOUT CONTRAST 05/20/2024 10:40:00 AM TECHNIQUE: CT of the head was performed without the administration of intravenous contrast. Automated exposure control, iterative reconstruction, and/or weight based adjustment of the mA/kV was utilized to reduce the radiation dose to as low as reasonably achievable. COMPARISON: CT head without  contrast 10/24/2022. CLINICAL HISTORY: Altered mental status. FINDINGS: BRAIN AND VENTRICLES: No acute hemorrhage. A remote lacunar infarct is  present in the right lateral nucleus. Moderate atrophy and white matter changes are similar to the prior exam. Atherosclerotic calcifications are present in the cavernous carotid arteries bilaterally and at the dural margin of both vertebral arteries. No hyperdense vessel is present. No hydrocephalus. No extra-axial collection. No mass effect or midline shift. ORBITS: A left lens replacement is present. SINUSES: No acute abnormality. SOFT TISSUES AND SKULL: No acute soft tissue abnormality. No skull fracture. IMPRESSION: 1. No acute intracranial abnormality. 2. Remote lacunar infarct in the right lateral nucleus. 3. Moderate cerebral atrophy and white matter changes, similar to the prior exam. 4. Atherosclerotic calcifications in the cavernous carotid arteries bilaterally and at the dural margins of both vertebral arteries. Electronically signed by: Lonni Necessary MD 05/20/2024 10:51 AM EST RP Workstation: [id number]   DG Chest 1 View Result Date: 05/20/2024 EXAM: 1 VIEW(S) XRAY OF THE CHEST 05/20/2024 08:30:00 AM COMPARISON: 10/24/2022 CLINICAL HISTORY: ams FINDINGS: LUNGS AND PLEURA: No focal pulmonary opacity. No pleural effusion. No pneumothorax. HEART AND MEDIASTINUM: Left single lead cardiac device in place with lead in right ventricle. Aortic atherosclerosis. Borderline cardiomegaly. BONES AND SOFT TISSUES: No acute osseous abnormality. IMPRESSION: 1. No acute cardiopulmonary process. 2. Borderline cardiomegaly. 3. Aortic atherosclerosis. Electronically signed by: Waddell Calk MD 05/20/2024 08:45 AM EST RP Workstation: [id number]    EKG: My personal interpretation of EKG shows: Sinus rhythm without any acute ST changes.  When compared to previous EKG it is unchanged.  Assessment/Plan Principal Problem:   Acute encephalopathy Active Problems:    Essential hypertension, benign   DM type 2 with diabetic peripheral neuropathy (HCC)   Chronic atrial fibrillation (HCC)   Sick sinus syndrome (HCC)   Hyperlipidemia associated with type 2 diabetes mellitus [facility])   Patient presenting with acute encephalopathy from her nursing facility.  Exact etiology is not known.  Head imaging does not show any acute findings but is limited to CT head currently.  MRI can only be performed on Monday.  I discussed with MRI technologist and he stated this is hospital wide so transferring will not lead to early MRI.  No urgent need for an MRI right now as concern for stroke is lower on my differential and she is outside of any acute therapies. Given she had a fever infection is differential diagnosis for her acute encephalopathy.  UA without signs of infection and chest x-ray without any signs of infection.  Will get expanded respiratory panel.  Will also get an ammonia and Depakote  level. Meningitis was considered but her physical exam does not show any meningeal signs with supple neck and negative brudzinski.   CTAP showed atrial thrombus but no other acute abnormalities.  This did not make me consider embolic stroke but patient without any focal deficits and outside of any acute symptoms.  To further rule out a stroke he did consider getting a CTA but she already had significant contrast in may because more adverse side effects than benefits.  Her agitation has been refractory to Zyprexa  and her QTc is prolonged and she has received multiple doses of IV Ativan .  This discussed with daughter at bedside that Ativan  can worsen mental status and that we may just need to place her in restraints until we have more data.  The daughter is against any restraints.  Will admit patient to stepdown as she may need Precedex drip if she continues to be agitated as we look for reversible causes. Pt had episode of vivid hallucinations last year and there was  concern for lewy body dementia, if  ordered work up is negative then may need to consider this possibility and involvement of neurology.  -Continue IVF -Follow up on resp panel, ammonia level, EEG -Use as needed chemical restraint as daughter is against physical restraints given pts advanced age.  -Scheduled EKG to guide agitation medication management.   Left atrial appendage: Likely secondary to her A-fib and subtherapeutic INR.  Getting IV heparin .  Echo ordered.  Will discuss case with cardiology.  Hypertension: Continue to monitor, can use IV labetalol  as needed .  Will allow permissive hypertension today given there is a possibility she would have had a stroke in which case this would be a therapeutic intervention.  But she can be normalized tomorrow as she will be near the 48-hour mark.  Type 2 diabetes: Continue to monitor as patient is n.p.o. currently.  Start SSI as needed  MDD/GAD: Continue home meds when patient able to tolerate p.o.   VTE prophylaxis:  IV heparin  gtts  Diet: N.p.o. Code Status:  DNR/DNI(Do NOT Intubate) Telemetry:  Admission status: Inpatient, Progressive Patient is from: Home Anticipated d/c is to: Home Anticipated d/c is in: 2-3 days   Family Communication: Updated at bedside  Consults called: Discussed with Dr. Florencio   Severity of Illness: The appropriate patient status for this patient is INPATIENT. Inpatient status is judged to be reasonable and necessary in order to provide the required intensity of service to ensure the patient's safety. The patient's presenting symptoms, physical exam findings, and initial radiographic and laboratory data in the context of their chronic comorbidities is felt to place them at high risk for further clinical deterioration. Furthermore, it is not anticipated that the patient will be medically stable for discharge from the hospital within 2 midnights of admission.   * I certify that at the point of admission it is my clinical judgment that the  patient will require inpatient hospital care spanning beyond 2 midnights from the point of admission due to high intensity of service, high risk for further deterioration and high frequency of surveillance required.DEWAINE Morene Bathe, MD [place] DEL. [facility]

## 2024-05-20 NOTE — ED Notes (Signed)
 Assisted RN with changing pt brief and pad underneath pt at this time.

## 2024-05-20 NOTE — Progress Notes (Signed)
  Echocardiogram 2D Echocardiogram has been performed.  Leah Holmes 05/20/2024, 4:51 PM

## 2024-05-20 NOTE — ED Triage Notes (Signed)
 Pt to ED [facility] from [facility] for AMS and slurred speech  since last night. LKW unwell. EDP at bedside examining pt. Pt has clear speech, symmetrical movement in all extremities. Pt is very confused and keeps saying help me! Help me! And was combative with EMS en route. EMS gave 2.5mg  Versed  IM en route. Per staff at [facility] pt is normally a&o x4 with no hx dementia but does have hx anxiety and has not had any of her AM meds.EMS VS: 194/53, 97%, could not get other VS because pt so confused and combative.

## 2024-05-20 NOTE — ED Notes (Signed)
 Daughter does not want restraints. Spokw on phone with Zdr Fernand. Dr Fernand will come back and talk to daughter re restraints or sedation.

## 2024-05-20 NOTE — ED Provider Notes (Signed)
 Leah Holmes Provider Note    Event Date/Time   First MD Initiated Contact with Patient 05/20/24 [phone]     (approximate)   History   Altered Mental Status   HPI  Leah Holmes is a 89 y.o. female with history of CAD, sick sinus syndrome status post pacemaker, history of atrial fibrillation on Coumadin , pulmonary hypertension, hypertension, anxiety, diabetes, presenting with altered mental status.  History is limited due to patient's altered mental status state.  Per independent history from EMS, no reported history of dementia for them, no reported falls, facility had noted that she was altered yesterday night, this morning was not following commands, facility also noted slurred speech.  Facility was unable to provide a last known well for her other than that she was altered last night.  She had noted shoulder pain for them but they did not palpate any deformities, she had no tenderness on exam.  They did give her 2mg  versed  for anxiety.  On independent chart review, she was admitted in May 2024 for 1 week of headache and hallucinations.  Ophthalmology had evaluated her outpatient and found no acute changes.  She had an MRI as well as CTs that did not show any acute findings.  EEG done at that time was unrevealing.  Neurology had considered that this might be initial onset of Lewy body dementia.     Physical Exam   Triage Vital Signs: ED Triage Vitals  Encounter Vitals Group     BP      Girls Systolic BP Percentile      Girls Diastolic BP Percentile      Boys Systolic BP Percentile      Boys Diastolic BP Percentile      Pulse      Resp      Temp      Temp src      SpO2      Weight      Height      Head Circumference      Peak Flow      Pain Score      Pain Loc      Pain Education      Exclude from Growth Chart     Most recent vital signs: Vitals:   05/20/24 1250 05/20/24 1253  BP: (!) 176/69   Pulse: 67   Resp: 20   Temp:  98.9  F (37.2 C)  SpO2: 100%      General: Awake, no distress.  CV:  Good peripheral perfusion.  Resp:  Normal effort.  No tachypnea or respiratory distress Abd:  No distention.  Soft nontender Other:  Patient has no aphasia, speech does not sound slurred.  No facial droop, pupils are equal, she is moving all 4 extremities without focal weakness or numbness.  No bony tenderness to her bilateral shoulders, able to range her shoulders, no deformities.   ED Results / Procedures / Treatments   Labs (all labs ordered are listed, but only abnormal results are displayed) Labs Reviewed  COMPREHENSIVE METABOLIC PANEL WITH GFR - Abnormal; Notable for the following components:      Result Value   Sodium 134 (*)    Glucose, Bld 141 (*)    All other components within normal limits  CBC WITH DIFFERENTIAL/PLATELET - Abnormal; Notable for the following components:   Abs Immature Granulocytes 0.08 (*)    All other components within normal limits  URINALYSIS, W/ REFLEX TO CULTURE (INFECTION SUSPECTED) -  Abnormal; Notable for the following components:   Color, Urine YELLOW (*)    APPearance CLEAR (*)    Leukocytes,Ua TRACE (*)    Non Squamous Epithelial PRESENT (*)    All other components within normal limits  RESP PANEL BY RT-PCR (RSV, FLU A&B, COVID)  [id number]  PROTIME-INR  APTT  TROPONIN T, HIGH SENSITIVITY     EKG  EKG shows, paced rhythm, rate of 63, widened QRS, does not meet Sgarbossa's criteria, T wave version to 1, aVL, not significantly changed compared to prior   RADIOLOGY On my independent interpretation, CT without obvious intracranial hemorrhage   PROCEDURES:  Critical Care performed: Yes, see critical care procedure note(s)  .Critical Care  Performed by: Waymond Lorelle Cummins, MD Authorized by: Waymond Lorelle Cummins, MD   Critical care provider statement:    Critical care time (minutes):  40   Critical care was time spent personally by me on the following activities:  Development of  treatment plan with patient or surrogate, discussions with consultants, evaluation of patient's response to treatment, examination of patient, ordering and review of laboratory studies, ordering and review of radiographic studies, ordering and performing treatments and interventions, pulse oximetry, re-evaluation of patient's condition and review of old charts    MEDICATIONS ORDERED IN ED: Medications  heparin  bolus via infusion 3,600 Units (has no administration in time range)    Followed by  heparin  ADULT infusion 100 units/mL (25000 units/250mL) (has no administration in time range)  OLANZapine  (ZYPREXA ) injection 5 mg (5 mg Intramuscular Given 05/20/24 0806)  LORazepam  (ATIVAN ) injection 2 mg (2 mg Intravenous Given 05/20/24 0855)  iohexol  (OMNIPAQUE ) 300 MG/ML solution 100 mL (100 mLs Intravenous Contrast Given 05/20/24 1147)     IMPRESSION / MDM / ASSESSMENT AND PLAN / ED COURSE  I reviewed the triage vital signs and the nursing notes.                              Differential diagnosis includes, but is not limited to, dementia, electrolyte derangements, UTI, CVA, atypical ACS.  For her shoulder pain, she did not have any bony tenderness, no reported falls.  Able to move her bilateral upper extremities and range her shoulder.  Did consider musculoskeletal strain, sprain.  Atypical ACS.  Labs, EKG, troponin, chest x-ray, CT head.  Stroke alert was not activated given that last known well is unknown and her altered mental status started last night, also on Coumadin .  Patient's presentation is most consistent with acute presentation with potential threat to life or bodily function.  Manage obtained additional history from daughter, patient did have a temperature of 100.5 yesterday, was also noted to be acutely confused around 2 PM yesterday.  No report of falls.  States that has been seen by her primary care doctors and no history of dementia.  Did note that she was admitted last year in May  for hallucinations and neurology had noted possible Lewy body dementia then.  Independent interpretation of labs and imaging below.  Clinical course as below.  Did discuss with daughter about imaging and lab results including incidental findings.  She is agreeable with the plan to proceed with admission.  Given her confusion, subtherapeutic INR, left atrial thrombus, she will need to be admitted for further management.  Consult the hospitalist who will evaluate the patient and admit her.  She is admitted.    Clinical Course as of 05/20/24 1349  Sat May 20, 2024  0851 DG Chest 1 View IMPRESSION: 1. No acute cardiopulmonary process. 2. Borderline cardiomegaly. 3. Aortic atherosclerosis.   [TT]  [id number] Independent review labs, electrolytes not severely deranged, no leukocytosis, her INR is subtherapeutic, troponin is not elevated. [TT]  0900 Urinalysis, w/ Reflex to Culture (Infection Suspected) -Urine, Clean Catch(!) UA shows trace leukocytes with 0-5 WBCs and no bacteria. [TT]  1050 Patient has a temperature of 100.1, will add a respiratory viral panel. [TT]  1056 CT HEAD WO CONTRAST ( ) IMPRESSION: 1. No acute intracranial abnormality. 2. Remote lacunar infarct in the right lateral nucleus. 3. Moderate cerebral atrophy and white matter changes, similar to the prior exam. 4. Atherosclerotic calcifications in the cavernous carotid arteries bilaterally and at the dural margins of both vertebral arteries.  [TT]  1222 Resp panel by RT-PCR (RSV, Flu A&B, Covid) Anterior Nasal Swab Negative [TT]  1236 CT CHEST ABDOMEN PELVIS W CONTRAST IMPRESSION: 1. Probable thrombus in the left atrial appendage. 2. Small bilateral pleural effusions with minimal adjacent subsegmental atelectasis.   [TT]  1309 CT shows probable thrombus in the left atrial appendage, given that she is subtherapeutic on her INR, will start her on IV heparin  to bridge.  Will plan to have her admitted, likely need CT angio  or MRI brain inpatient. [TT]    Clinical Course User Index [TT] Waymond Lorelle Cummins, MD     FINAL CLINICAL IMPRESSION(S) / ED DIAGNOSES   Final diagnoses:  Confusion  Subtherapeutic international normalized ratio (INR)  Left atrial thrombus  Pleural effusion  Fever, unspecified fever cause     Rx / DC Orders   ED Discharge Orders     None        Note:  This document was prepared using Dragon voice recognition software and may include unintentional dictation errors.    Waymond Lorelle Cummins, MD 05/20/24 [phone]

## 2024-05-20 NOTE — Consult Note (Signed)
 CARDIOLOGY CONSULT NOTE               Patient ID: Leah Holmes MRN: [id number] DOB/AGE: 23-May-1927 89 y.o.  Admit date: 05/20/2024 Referring Physician Dr Morene Bathe hospitalist Primary Physician  Primary Cardiologist [facility] cardiology Reason for Consultation altered mental status atrial fibrillation possible LA thrombus  HPI: Patient is a 89 year old female history of atrial fibrillation sick sinus syndrome permanent pacemaker in place hypertension hyperlipidemia on warfarin but had subtherapeutic INRs had a CT performed because of confusion altered mental status there was some concern about left atrial thrombus cardiology was then consulted for evaluation no recent fevers UTI upper respiratory infection confusion gotten progressively worse so she was brought to the emergency room for evaluation patient was agitated overnight received significant benzodiazepines to help and that the patient is resting comfortably asleep daughters in the room  Review of systems complete and found to be negative unless listed above     Past Medical History:  Diagnosis Date   Atrial fibrillation (HCC) 2012   Breast cancer (HCC) 04/2018   IDC of left breast    CHF (congestive heart failure) (HCC)    Colon polyp 2003   Compression fracture of L2 lumbar vertebra (HCC)    Coronary artery disease    COVID-19 virus infection 12/25/2019   Diabetes mellitus without complication (HCC)    Dyspnea    Dysrhythmia    atrial fib   Environmental and seasonal allergies    GERD (gastroesophageal reflux disease)    Hyperlipidemia    Hypertension    Hypertensive urgency 03/10/2021   Irritable bowel syndrome    Multiple rib fractures 03/15/2018   Personal history of radiation therapy    Polyp of colon    PONV (postoperative nausea and vomiting)    with ether, not recently woke up in surgery once   Presence of permanent cardiac pacemaker    Sick sinus syndrome (HCC)    Tremor    Tremors of  nervous system    Ulcer     Past Surgical History:  Procedure Laterality Date   ABDOMINAL ADHESION SURGERY     ABDOMINAL HYSTERECTOMY     APPENDECTOMY     BREAST BIOPSY Right late 80s/early 90s   benign   BREAST BIOPSY Left 03/31/2018   us  bx done with Dr. Dessa, invasive ductal carcinoma   BREAST LUMPECTOMY Left 04/22/2018   Procedure: BREAST LUMPECTOMY;  Surgeon: Dessa Reyes ORN, MD;  Location: [facility] ORS;  Service: General;  Laterality: Left;   BREAST LUMPECTOMY Left 04/22/2018   [facility], negative LN, clear margins   BREAST LUMPECTOMY Left 2020   positive, done in office   BREAST SURGERY Right    biopsy    CESAREAN SECTION     x 2   CHOLECYSTECTOMY  03/2012   Dr Wonda   COLONOSCOPY  2003   Dr Bedelia in [place]    fibroid tumor     INTRAMEDULLARY (IM) NAIL INTERTROCHANTERIC Right 10/25/2022   Procedure: INTRAMEDULLARY (IM) NAIL INTERTROCHANTERIC;  Surgeon: Ernie Cough, MD;  Location: [facility] OR;  Service: Orthopedics;  Laterality: Right;   PACEMAKER INSERTION N/A 01/28/2016   Procedure: INSERTION PACEMAKER;  Surgeon: Marsa Dooms, MD;  Location: [facility] ORS;  Service: Cardiovascular;  Laterality: N/A;   SENTINEL NODE BIOPSY Left 04/22/2018   Procedure: SENTINEL NODE BIOPSY;  Surgeon: Dessa Reyes ORN, MD;  Location: [facility] ORS;  Service: General;  Laterality: Left;   UPPER GI ENDOSCOPY  2003   Dr Bedelia  in [place]     (Not in a hospital admission)  Social History   Socioeconomic History   Marital status: Widowed    Spouse name: Not on file   Number of children: Not on file   Years of education: Not on file   Highest education level: 6th grade  Occupational History   Not on file  Tobacco Use   Smoking status: Never   Smokeless tobacco: Former    Types: Snuff   Tobacco comments:    occasionally  Vaping Use   Vaping status: Never Used  Substance and Sexual Activity   Alcohol  use: No   Drug use: No   Sexual activity: Never  Other Topics Concern   Not on file   Social History Narrative   Not on file   Social Drivers of Health   Financial Resource Strain: Low Risk  (09/03/2022)   Overall Financial Resource Strain (CARDIA)    Difficulty of Paying Living Expenses: Not hard at all  Food Insecurity: No Food Insecurity (10/24/2022)   Hunger Vital Sign    Worried About Running Out of Food in the Last Year: Never true    Ran Out of Food in the Last Year: Never true  Transportation Needs: No Transportation Needs (10/24/2022)   PRAPARE - Administrator, Civil Service (Medical): No    Lack of Transportation (Non-Medical): No  Physical Activity: Unknown (09/03/2022)   Exercise Vital Sign    Days of Exercise per Week: 0 days    Minutes of Exercise per Session: Not on file  Stress: No Stress Concern Present (09/03/2022)   [employer] of Occupational Health - Occupational Stress Questionnaire    Feeling of Stress : Only a little  Social Connections: Unknown (09/03/2022)   Social Connection and Isolation Panel    Frequency of Communication with Friends and Family: Patient declined    Frequency of Social Gatherings with Friends and Family: Patient declined    Attends Religious Services: Patient declined    Database Administrator or Organizations: No    Attends Engineer, Structural: Not on file    Marital Status: Widowed  Intimate Partner Violence: Not At Risk (10/24/2022)   Humiliation, Afraid, Rape, and Kick questionnaire    Fear of Current or Ex-Partner: No    Emotionally Abused: No    Physically Abused: No    Sexually Abused: No    Family History  Problem Relation Age of Onset   Breast cancer Sister    Cancer Sister 32       breast   Breast cancer Maternal Aunt 38   Breast cancer Cousin    Breast cancer Other 59      Review of systems complete and found to be negative unless listed above      PHYSICAL EXAM  General: Well developed, well nourished, in no acute distress HEENT:  Normocephalic and atramatic Neck:   No JVD.  Lungs: Clear bilaterally to auscultation and percussion. Heart: Irregularly irregular. Normal S1 and S2,  2/6 sem  no gallops rubs.  Abdomen: Bowel sounds are positive, abdomen soft and non-tender  Msk:  Back normal, normal gait. Normal strength and tone for age. Extremities: No clubbing, cyanosis or edema.   Neuro: Disoriented confused currently asleep after medication  Psych: Confusion, responds appropriately  Labs:   Lab Results  Component Value Date   WBC 7.8 05/20/2024   HGB 13.6 05/20/2024   HCT 40.6 05/20/2024   MCV 95.5 05/20/2024  PLT 244 05/20/2024    Recent Labs  Lab 05/20/24 0820  NA 134*  K 4.2  CL 98  CO2 25  BUN 14  CREATININE 0.57  CALCIUM  9.4  PROT 7.1  BILITOT 0.7  ALKPHOS 59  ALT 13  AST 21  GLUCOSE 141*   Lab Results  Component Value Date   CKTOTAL 88 03/08/2013   CKMB 1.9 02/16/2012   TROPONINI <0.03 03/07/2018    Lab Results  Component Value Date   CHOL 133 10/29/2022   CHOL 174 09/13/2020   CHOL 204 (H) 10/30/2015   Lab Results  Component Value Date   HDL 52 10/29/2022   HDL 60.10 09/13/2020   HDL 57.80 10/30/2015   Lab Results  Component Value Date   LDLCALC 69 10/29/2022   LDLCALC 101 (H) 09/13/2020   LDLCALC 128 (H) 10/30/2015   Lab Results  Component Value Date   TRIG 59 10/29/2022   TRIG 63.0 09/13/2020   TRIG 94.0 10/30/2015   Lab Results  Component Value Date   CHOLHDL 2.6 10/29/2022   CHOLHDL 3 09/13/2020   CHOLHDL 4 10/30/2015   Lab Results  Component Value Date   LDLDIRECT 116.0 10/30/2015   LDLDIRECT 122.0 07/24/2015   LDLDIRECT 95.0 03/13/2015      Radiology: ECHOCARDIOGRAM COMPLETE Result Date: 05/20/2024    ECHOCARDIOGRAM REPORT   Patient Name:   Leah Holmes Redford Date of Exam: 05/20/2024 Medical Rec #:  [id number]               Height:       66.0 in Accession #:    [id number]              Weight:       130.7 lb Date of Birth:  21-Mar-1927               BSA:          1.669 m Patient Age:     89 years                BP:           176/69 mmHg Patient Gender: F                       HR:           71 bpm. Exam Location:  [facility] Procedure: 2D Echo, Cardiac Doppler and Color Doppler (Both Spectral and Color            Flow Doppler were utilized during procedure). STAT ECHO Indications:     Thrombus of atrial appendage  History:         Patient has prior history of Echocardiogram examinations, most                  recent 10/26/2022.  Sonographer:     Thedora Louder RDCS, FASE Referring Phys:  [id number] MORENE BATHE Diagnosing Phys: Cara JONETTA Lovelace MD  Sonographer Comments: Technically challenging study due to limited acoustic windows and suboptimal apical window. Image acquisition challenging due to uncooperative patient and Image acquisition challenging due to patient behavioral factors. Very difficult study to complete, despite patient given ativan . IMPRESSIONS  1. Left ventricular ejection fraction, by estimation, is 55 to 60%. The left ventricle has normal function. The left ventricle demonstrates regional wall motion abnormalities (see scoring diagram/findings for description). Left ventricular diastolic parameters are consistent with Grade III diastolic dysfunction (restrictive).  2. Right ventricular systolic function is  normal. The right ventricular size is mildly enlarged.  3. Poor windows , no thrombus seen.. Left atrial size was severely dilated.  4. Right atrial size was mild to moderately dilated.  5. 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  6. Tricuspid valve regurgitation is mild to moderate.  7. The aortic valve is calcified. Aortic valve regurgitation is trivial. Severe aortic valve stenosis. FINDINGS  Left Ventricle: Left ventricular ejection fraction, by estimation, is 55 to 60%. The left ventricle has normal function. The left ventricle demonstrates regional wall motion abnormalities. Strain was performed and the global longitudinal strain is  indeterminate. The left ventricular internal cavity size was normal in size. There is borderline concentric left ventricular hypertrophy. Left ventricular diastolic parameters are consistent with Grade III diastolic dysfunction (restrictive). Right Ventricle: The right ventricular size is mildly enlarged. No increase in right ventricular wall thickness. Right ventricular systolic function is normal. Left Atrium: Poor windows , no thrombus seen. Left atrial size was severely dilated. Right Atrium: Right atrial size was mild to moderately dilated. Pericardium: There is no evidence of pericardial effusion. Mitral Valve: The mitral valve is myxomatous. There is moderate thickening of the mitral valve leaflet(s). There is moderate calcification of the mitral valve leaflet(s). Mild to moderate mitral valve regurgitation. MV peak gradient, 9.6 mmHg. The mean mitral valve gradient is 3.0 mmHg. Tricuspid Valve: The tricuspid valve is normal in structure. Tricuspid valve regurgitation is mild to moderate. Aortic Valve: The aortic valve is calcified. Aortic valve regurgitation is trivial. Severe aortic stenosis is present. Aortic valve mean gradient measures 38.5 mmHg. Aortic valve peak gradient measures 68.2 mmHg. Aortic valve area, by VTI measures 0.29 cm. Pulmonic Valve: The pulmonic valve was not well visualized. Pulmonic valve regurgitation is trivial. Aorta: The ascending aorta was not well visualized. IAS/Shunts: No atrial level shunt detected by color flow Doppler. Additional Comments: 3D was performed not requiring image post processing on an independent workstation and was indeterminate. A device lead is visualized.  LEFT VENTRICLE PLAX 2D LVIDd:         3.70 cm   Diastology LVIDs:         2.60 cm   LV e' medial:    5.22 cm/s LV PW:         1.10 cm   LV E/e' medial:  31.6 LV IVS:        1.20 cm   LV e' lateral:   7.62 cm/s LVOT diam:     1.80 cm   LV E/e' lateral: 21.7 LV SV:         29 LV SV Index:   17 LVOT Area:      2.54 cm  LEFT ATRIUM           Index LA diam:      5.50 cm 3.30 cm/m LA Vol (A4C): 87.0 ml 52.13 ml/m  AORTIC VALVE                     PULMONIC VALVE AV Area (Vmax):    0.33 cm      PV Vmax:          0.76 m/s AV Area (Vmean):   0.31 cm      PV Peak grad:     2.3 mmHg AV Area (VTI):     0.29 cm      PR End Diast Vel: 6.97 msec AV Vmax:  413.00 cm/s   RVOT Peak grad:   2 mmHg AV Vmean:          289.500 cm/s AV VTI:            1.005 m AV Peak Grad:      68.2 mmHg AV Mean Grad:      38.5 mmHg LVOT Vmax:         54.10 cm/s LVOT Vmean:        35.600 cm/s LVOT VTI:          0.114 m LVOT/AV VTI ratio: 0.11  AORTA Ao Root diam: 3.00 cm Ao Asc diam:  2.80 cm MITRAL VALVE                TRICUSPID VALVE MV Area (PHT): 4.21 cm     TR Peak grad:   30.9 mmHg MV Area VTI:   0.96 cm     TR Vmax:        278.00 cm/s MV Peak grad:  9.6 mmHg MV Mean grad:  3.0 mmHg     SHUNTS MV Vmax:       1.55 m/s     Systemic VTI:  0.11 m MV Vmean:      75.0 cm/s    Systemic Diam: 1.80 cm MV Decel Time: 180 msec MV E velocity: 165.00 cm/s MV A velocity: 68.10 cm/s MV E/A ratio:  2.42 Jude Linck D Maymie Brunke MD Electronically signed by Cara JONETTA Lovelace MD Signature Date/Time: 05/20/2024/5:13:11 PM    Final    CT CHEST ABDOMEN PELVIS W CONTRAST Result Date: 05/20/2024 EXAM: CT CHEST, ABDOMEN AND PELVIS WITH CONTRAST 05/20/2024 11:50:50 AM TECHNIQUE: CT of the chest, abdomen and pelvis was performed with the administration of intravenous contrast. 100 mL of iohexol  (OMNIPAQUE ) 300 MG/ML solution was administered. Multiplanar reformatted images are provided for review. Automated exposure control, iterative reconstruction, and/or weight based adjustment of the mA/kV was utilized to reduce the radiation dose to as low as reasonably achievable. COMPARISON: 11/01/2022 status post cholecystectomy. CLINICAL HISTORY: ams, fever FINDINGS: CHEST: MEDIASTINUM AND LYMPH NODES: Aortic and mitral valve calcifications are noted. Probable thrombus is  noted in the left atrial appendage. Coronary artery calcifications are noted. Pericardium is unremarkable. The central airways are clear. No mediastinal, hilar or axillary lymphadenopathy. LUNGS AND PLEURA: Small bilateral pleural effusions are noted with minimal adjacent sub-some atelectasis. No focal consolidation or pulmonary edema. No pneumothorax. ABDOMEN AND PELVIS: LIVER: The liver is unremarkable. GALLBLADDER AND BILE DUCTS: Status post cholecystectomy. No biliary ductal dilatation. SPLEEN: No acute abnormality. PANCREAS: No acute abnormality. ADRENAL GLANDS: No acute abnormality. KIDNEYS, URETERS AND BLADDER: No stones in the kidneys or ureters. No hydronephrosis. No perinephric or periureteral stranding. Urinary bladder is unremarkable. GI AND BOWEL: Stomach demonstrates no acute abnormality. There is no bowel obstruction. REPRODUCTIVE ORGANS: No acute abnormality. PERITONEUM AND RETROPERITONEUM: No ascites. No free air. VASCULATURE: Aorta is normal in caliber. Aortic atherosclerosis. ABDOMINAL AND PELVIS LYMPH NODES: No lymphadenopathy. BONES AND SOFT TISSUES: Old L1 and L2 compression fractures are noted. No acute osseous abnormality. No focal soft tissue abnormality. IMPRESSION: 1. Probable thrombus in the left atrial appendage. 2. Small bilateral pleural effusions with minimal adjacent subsegmental atelectasis. Electronically signed by: Lynwood Seip MD 05/20/2024 12:28 PM EST RP Workstation: [id number]   CT HEAD WO CONTRAST ( ) Result Date: 05/20/2024 EXAM: CT HEAD WITHOUT CONTRAST 05/20/2024 10:40:00 AM TECHNIQUE: CT of the head was performed without the administration of intravenous contrast. Automated exposure control, iterative reconstruction, and/or weight  based adjustment of the mA/kV was utilized to reduce the radiation dose to as low as reasonably achievable. COMPARISON: CT head without contrast 10/24/2022. CLINICAL HISTORY: Altered mental status. FINDINGS: BRAIN AND VENTRICLES: No acute  hemorrhage. A remote lacunar infarct is present in the right lateral nucleus. Moderate atrophy and white matter changes are similar to the prior exam. Atherosclerotic calcifications are present in the cavernous carotid arteries bilaterally and at the dural margin of both vertebral arteries. No hyperdense vessel is present. No hydrocephalus. No extra-axial collection. No mass effect or midline shift. ORBITS: A left lens replacement is present. SINUSES: No acute abnormality. SOFT TISSUES AND SKULL: No acute soft tissue abnormality. No skull fracture. IMPRESSION: 1. No acute intracranial abnormality. 2. Remote lacunar infarct in the right lateral nucleus. 3. Moderate cerebral atrophy and white matter changes, similar to the prior exam. 4. Atherosclerotic calcifications in the cavernous carotid arteries bilaterally and at the dural margins of both vertebral arteries. Electronically signed by: Lonni Necessary MD 05/20/2024 10:51 AM EST RP Workstation: [id number]   DG Chest 1 View Result Date: 05/20/2024 EXAM: 1 VIEW(S) XRAY OF THE CHEST 05/20/2024 08:30:00 AM COMPARISON: 10/24/2022 CLINICAL HISTORY: ams FINDINGS: LUNGS AND PLEURA: No focal pulmonary opacity. No pleural effusion. No pneumothorax. HEART AND MEDIASTINUM: Left single lead cardiac device in place with lead in right ventricle. Aortic atherosclerosis. Borderline cardiomegaly. BONES AND SOFT TISSUES: No acute osseous abnormality. IMPRESSION: 1. No acute cardiopulmonary process. 2. Borderline cardiomegaly. 3. Aortic atherosclerosis. Electronically signed by: Waddell Calk MD 05/20/2024 08:45 AM EST RP Workstation: [id number]    EKG: Atrial fibrillation interventricular conduction delay consistent with incomplete left bundle rate of 75  Echo preserved left ventricular function EF around 55% severe aortic stenosis no evidence intracardiac thrombus negative bubble study  ASSESSMENT AND PLAN:  Altered mental status Atrial fibrillation History of  congestive heart failure Coronary disease Hyperlipidemia Abnormal EKG Hypertension Sick sinus syndrome Permanent pacemaker in place . Plan Altered mental status unclear etiology recommend continued medical therapy critical care management as necessary volume replacement rule out infection Abnormal EKG consistent with atrial fibrillation left bundle like pattern possibly paced beats Sick sinus syndrome permanent pacemaker in place Atrial fibrillation currently not on anticoagulation Abnormal CT with possible left atrial thrombus currently on anticoagulation further evaluation with general require TEE which I do not recommend with her comorbid state Hyperlipidemia and continued lipid management statin therapy History of congestive heart failure PCP compensated heart rate slightly volume down Recommend conservative cardiac input No invasive procedures recommended Signed: Cara JONETTA Lovelace MD 05/20/2024, 5:39 PM

## 2024-05-20 NOTE — Progress Notes (Signed)
 PHARMACY - ANTICOAGULATION CONSULT NOTE  Pharmacy Consult for Heparin  infusion  Indication: atrial fibrillation, Probable thrombus in the left atrial appendage   Allergies  Allergen Reactions   Codeine Anaphylaxis   Penicillins Anaphylaxis   Clonidine Derivatives Other (See Comments)    Unknown reaction   Cozaar  [Losartan ] Other (See Comments)    Myalgias    Lipitor [Atorvastatin] Other (See Comments)    Unknown reaction   Ms Contin [Morphine] Other (See Comments)    Unknown reaction   Neurontin  [Gabapentin ] Nausea Only   Reglan [Metoclopramide] Other (See Comments)    Tremors    Valtrex  [Valacyclovir ] Other (See Comments)    Tremors    Aldactone  [Spironolactone ] Itching   Apresoline  [Hydralazine ] Anxiety   Norvasc  [Amlodipine ] Itching    Does cause itching. Pt does take PRN if needed.    Patient Measurements: Height: 5' 6 (167.6 cm) Weight: 53.1 kg (117 lb 1 oz) IBW/kg (Calculated) : 59.3 HEPARIN  DW (KG): 53.1  Vital Signs: Temp: 98.3 F (36.8 C) (12/06 1940) Temp Source: Oral (12/06 1940) BP: 177/63 (12/06 2200) Pulse Rate: 60 (12/06 2200)  Labs: Recent Labs    05/20/24 0820 05/20/24 1347 05/20/24 2146  HGB 13.6  --   --   HCT 40.6  --   --   PLT 244  --   --   APTT  --  40*  --   LABPROT 14.9  --   --   INR 1.1  --   --   HEPARINUNFRC  --   --  0.62  CREATININE 0.57  --   --     Estimated Creatinine Clearance: 35.3 mL/min (by C-G formula based on SCr of 0.57 mg/dL).   Medical History: Past Medical History:  Diagnosis Date   Atrial fibrillation (HCC) 2012   Breast cancer (HCC) 04/2018   IDC of left breast    CHF (congestive heart failure) (HCC)    Colon polyp 2003   Compression fracture of L2 lumbar vertebra (HCC)    Coronary artery disease    COVID-19 virus infection 12/25/2019   Diabetes mellitus without complication (HCC)    Dyspnea    Dysrhythmia    atrial fib   Environmental and seasonal allergies    GERD (gastroesophageal reflux  disease)    Hyperlipidemia    Hypertension    Hypertensive urgency 03/10/2021   Irritable bowel syndrome    Multiple rib fractures 03/15/2018   Personal history of radiation therapy    Polyp of colon    PONV (postoperative nausea and vomiting)    with ether, not recently woke up in surgery once   Presence of permanent cardiac pacemaker    Sick sinus syndrome (HCC)    Tremor    Tremors of nervous system    Ulcer     Medications:  Warfarin 5mg  daily   Assessment: 89 yo female presenting to ED with AMS. Patient has PMH CAD, sick sinus syndrome S/P pacemaker, A. fib on Coumadin , pulmonary hypertension, HTN, anxiety, and diabetes. CT Chest Abd showing Probable thrombus in the left atrial appendage. INR subtherapeutic on admission. Pharmacy has been consulted for heparin  infusion dosing and monitoring.   Goal of Therapy:  INR 2-3 Heparin  level 0.3-0.7 units/ml Monitor platelets by anticoagulation protocol: Yes   Plan:  12/6:  HL @ 2146 = 0.62, therapeutic X 1 - will continue pt on current rate and recheck HL in 8 hrs  Continue to monitor H&H and platelets  Roseland Braun D, PharmD Clinical  Pharmacist 05/20/2024 10:33 PM

## 2024-05-20 NOTE — Progress Notes (Signed)
 eLink Physician-Brief Progress Note Patient Name: Leah Holmes DOB: Oct 30, 1926 MRN: [id number]   Date of Service  05/20/2024  HPI/Events of Note  89 year old with a history of metabolic syndrome, atrial fibrillation on warfarin, and sick sinus with pacemaker in place that is developing worsening confusion and altered mentation admitted to the ICU for delirium management, workup concerning for left atrial thrombus.  Vitals, labs, and imaging reviewed Echocardiogram inconsistent with left atrial thrombus.  eICU Interventions  Heparin  infusion in place Maintain LR infusion, delirium precautions, avoid benzodiazepines in favor of antipsychotics or potentially Precedex.  DVT prophylaxis with therapeutic heparin  GI prophylaxis not indicated     Intervention Category Evaluation Type: New Patient Evaluation  Leah Holmes 05/20/2024, 9:06 PM

## 2024-05-20 NOTE — Progress Notes (Signed)
  Echocardiogram 2D Echocardiogram has NOT been performed. Attempted to complete echo twice (1st- patient was combative, 2nd- RN had to clean patient up), will attempt tomorrow 05/21/24.  Thedora GORMAN Louder 05/20/2024, 3:11 PM

## 2024-05-20 NOTE — Progress Notes (Signed)
 PHARMACY - ANTICOAGULATION CONSULT NOTE  Pharmacy Consult for Heparin  infusion  Indication: atrial fibrillation, Probable thrombus in the left atrial appendage   Allergies  Allergen Reactions   Codeine Anaphylaxis   Penicillins Anaphylaxis   Clonidine Derivatives Other (See Comments)    Unknown reaction   Cozaar  [Losartan ] Other (See Comments)    Myalgias    Lipitor [Atorvastatin] Other (See Comments)    Unknown reaction   Ms Contin [Morphine] Other (See Comments)    Unknown reaction   Neurontin  [Gabapentin ] Nausea Only   Reglan [Metoclopramide] Other (See Comments)    Tremors    Valtrex  [Valacyclovir ] Other (See Comments)    Tremors    Aldactone  [Spironolactone ] Itching   Apresoline  [Hydralazine ] Anxiety   Norvasc  [Amlodipine ] Itching    Does cause itching. Pt does take PRN if needed.    Patient Measurements: Height: 5' 6 (167.6 cm) Weight: 59.3 kg (130 lb 11.2 oz) IBW/kg (Calculated) : 59.3 HEPARIN  DW (KG): 59.3  Vital Signs: Temp: 98.9 F (37.2 C) (12/06 1253) Temp Source: Axillary (12/06 1253) BP: 176/69 (12/06 1250) Pulse Rate: Leah (12/06 1250)  Labs: Recent Labs    05/20/24 0820  HGB 13.6  HCT 40.6  PLT 244  LABPROT 14.9  INR 1.1  CREATININE 0.57    Estimated Creatinine Clearance: 39.4 mL/min (by C-G formula based on SCr of 0.57 mg/dL).   Medical History: Past Medical History:  Diagnosis Date   Atrial fibrillation (HCC) 2012   Breast cancer (HCC) 04/2018   IDC of left breast    CHF (congestive heart failure) (HCC)    Colon polyp 2003   Compression fracture of L2 lumbar vertebra (HCC)    Coronary artery disease    COVID-19 virus infection 12/25/2019   Diabetes mellitus without complication (HCC)    Dyspnea    Dysrhythmia    atrial fib   Environmental and seasonal allergies    GERD (gastroesophageal reflux disease)    Hyperlipidemia    Hypertension    Hypertensive urgency 03/10/2021   Irritable bowel syndrome    Multiple rib fractures  03/15/2018   Personal history of radiation therapy    Polyp of colon    PONV (postoperative nausea and vomiting)    with ether, not recently woke up in surgery once   Presence of permanent cardiac pacemaker    Sick sinus syndrome (HCC)    Tremor    Tremors of nervous system    Ulcer     Medications:  Warfarin 5mg  daily   Assessment: 89 yo Holmes presenting to ED with AMS. Patient has PMH CAD, sick sinus syndrome S/P pacemaker, A. fib on Coumadin , pulmonary hypertension, HTN, anxiety, and diabetes. CT Chest Abd showing Probable thrombus in the left atrial appendage. INR subtherapeutic on admission. Pharmacy has been consulted for heparin  infusion dosing and monitoring.   Goal of Therapy:  INR 2-3 Heparin  level 0.3-0.7 units/ml Monitor platelets by anticoagulation protocol: Yes   Plan:  Baseline labs ordered Give 3600 units bolus x 1 Start heparin  infusion at 900 units/hr Check anti-Xa level in 8 hours and daily while on heparin  Continue to monitor H&H and platelets  Estill CHRISTELLA Lutes, PharmD, BCPS Clinical Pharmacist 05/20/2024 1:36 PM

## 2024-05-20 NOTE — ED Notes (Signed)
 Bed alarm/socks and soft mitts are in place.

## 2024-05-20 NOTE — ED Notes (Signed)
 Pt very agitated, moaning incomprehensibly, trying to get OOB. Hospitalist contacted. Hospitalist was at bedside a little while ago and pt exhibited same behavior.

## 2024-05-20 NOTE — Plan of Care (Signed)
 The patient is admitted overnight to AR-SDU/ICU. The patient is spontaneously awake but oriented to person only. No supplemental O2 requirement at this time. The patient is primarily in a V-paced rhythm with apparent underlying atrial fibrillation. The patient is receiving active infusions of Heparin  and LR via PIV. The patient has an external urinary containment device in place. The patient is hypertensive overnight, so this RN reached out to Dr. Fernand via secure chat at 1945 hours; per Dr. Fernand Since stroke is on my differential, we can continue to monitor unless systolic >220. The patient's admission profile is completed overnight by this RN with the help of the patient's daughter, Donny. Fall prevention measures are in place for the patient.   Problem: Safety: Goal: Non-violent Restraint(s) Outcome: Progressing   Problem: Education: Goal: Knowledge of General Education information will improve Description: Including pain rating scale, medication(s)/side effects and non-pharmacologic comfort measures Outcome: Progressing   Problem: Health Behavior/Discharge Planning: Goal: Ability to manage health-related needs will improve Outcome: Progressing   Problem: Clinical Measurements: Goal: Ability to maintain clinical measurements within normal limits will improve Outcome: Progressing Goal: Will remain free from infection Outcome: Progressing Goal: Diagnostic test results will improve Outcome: Progressing Goal: Respiratory complications will improve Outcome: Progressing Goal: Cardiovascular complication will be avoided Outcome: Progressing   Problem: Activity: Goal: Risk for activity intolerance will decrease Outcome: Progressing   Problem: Nutrition: Goal: Adequate nutrition will be maintained Outcome: Progressing   Problem: Coping: Goal: Level of anxiety will decrease Outcome: Progressing   Problem: Elimination: Goal: Will not experience complications related to bowel  motility Outcome: Progressing Goal: Will not experience complications related to urinary retention Outcome: Progressing   Problem: Pain Managment: Goal: General experience of comfort will improve and/or be controlled Outcome: Progressing   Problem: Safety: Goal: Ability to remain free from injury will improve Outcome: Progressing   Problem: Skin Integrity: Goal: Risk for impaired skin integrity will decrease Outcome: Progressing   Problem: Education: Goal: Ability to describe self-care measures that may prevent or decrease complications (Diabetes Survival Skills Education) will improve Outcome: Progressing Goal: Individualized Educational Video(s) Outcome: Progressing   Problem: Coping: Goal: Ability to adjust to condition or change in health will improve Outcome: Progressing   Problem: Fluid Volume: Goal: Ability to maintain a balanced intake and output will improve Outcome: Progressing   Problem: Health Behavior/Discharge Planning: Goal: Ability to identify and utilize available resources and services will improve Outcome: Progressing Goal: Ability to manage health-related needs will improve Outcome: Progressing   Problem: Metabolic: Goal: Ability to maintain appropriate glucose levels will improve Outcome: Progressing   Problem: Nutritional: Goal: Maintenance of adequate nutrition will improve Outcome: Progressing Goal: Progress toward achieving an optimal weight will improve Outcome: Progressing   Problem: Skin Integrity: Goal: Risk for impaired skin integrity will decrease Outcome: Progressing   Problem: Tissue Perfusion: Goal: Adequacy of tissue perfusion will improve Outcome: Progressing   Problem: Education: Goal: Knowledge of disease or condition will improve Outcome: Progressing Goal: Understanding of medication regimen will improve Outcome: Progressing Goal: Individualized Educational Video(s) Outcome: Progressing   Problem: Activity: Goal:  Ability to tolerate increased activity will improve Outcome: Progressing   Problem: Cardiac: Goal: Ability to achieve and maintain adequate cardiopulmonary perfusion will improve Outcome: Progressing   Problem: Health Behavior/Discharge Planning: Goal: Ability to safely manage health-related needs after discharge will improve Outcome: Progressing   Problem: Education: Goal: Ability to demonstrate management of disease process will improve Outcome: Progressing Goal: Ability to verbalize understanding of medication therapies  will improve Outcome: Progressing Goal: Individualized Educational Video(s) Outcome: Progressing   Problem: Activity: Goal: Capacity to carry out activities will improve Outcome: Progressing   Problem: Cardiac: Goal: Ability to achieve and maintain adequate cardiopulmonary perfusion will improve Outcome: Progressing

## 2024-05-21 LAB — CBC
HCT: 43.8 % (ref 36.0–46.0)
Hemoglobin: 14.6 g/dL (ref 12.0–15.0)
MCH: 32.6 pg (ref 26.0–34.0)
MCHC: 33.3 g/dL (ref 30.0–36.0)
MCV: 97.8 fL (ref 80.0–100.0)
Platelets: 221 K/uL (ref 150–400)
RBC: 4.48 MIL/uL (ref 3.87–5.11)
RDW: 12.3 % (ref 11.5–15.5)
WBC: 6.7 K/uL (ref 4.0–10.5)
nRBC: 0 % (ref 0.0–0.2)

## 2024-05-21 LAB — BASIC METABOLIC PANEL WITH GFR
Anion gap: 11 (ref 5–15)
BUN: 10 mg/dL (ref 8–23)
CO2: 24 mmol/L (ref 22–32)
Calcium: 9.5 mg/dL (ref 8.9–10.3)
Chloride: 99 mmol/L (ref 98–111)
Creatinine, Ser: 0.47 mg/dL (ref 0.44–1.00)
GFR, Estimated: >60 mL/min (ref >60)
Glucose, Bld: 105 mg/dL — ABNORMAL HIGH (ref 70–99)
Potassium: 3.9 mmol/L (ref 3.5–5.1)
Sodium: 134 mmol/L — ABNORMAL LOW (ref 135–145)

## 2024-05-21 LAB — PROTIME-INR
INR: 1.3 — ABNORMAL HIGH (ref 0.8–1.2)
Prothrombin Time: 16.7 s — ABNORMAL HIGH (ref 11.4–15.2)

## 2024-05-21 LAB — URINE CULTURE: Culture: NO GROWTH

## 2024-05-21 LAB — HEPARIN LEVEL (UNFRACTIONATED): Heparin Unfractionated: 0.68 [IU]/mL (ref 0.30–0.70)

## 2024-05-21 MED ORDER — LISINOPRIL 20 MG PO TABS
40.0000 mg | ORAL_TABLET | Freq: Every day | ORAL | Status: DC
  Administered 2024-05-22 – 2024-05-26 (×5): 40 mg via ORAL
  Filled 2024-05-21 (×5): qty 2

## 2024-05-21 MED ORDER — ISOSORBIDE MONONITRATE ER 60 MG PO TB24
60.0000 mg | ORAL_TABLET | Freq: Every day | ORAL | Status: AC
  Administered 2024-05-21 – 2024-05-26 (×6): 60 mg via ORAL
  Filled 2024-05-21: qty 1
  Filled 2024-05-21: qty 2
  Filled 2024-05-21 (×4): qty 1

## 2024-05-21 MED ORDER — HYDRALAZINE HCL 20 MG/ML IJ SOLN
10.0000 mg | Freq: Four times a day (QID) | INTRAMUSCULAR | Status: DC | PRN
  Administered 2024-05-21 – 2024-05-23 (×2): 10 mg via INTRAVENOUS
  Filled 2024-05-21 (×2): qty 1

## 2024-05-21 MED ORDER — DIVALPROEX SODIUM 125 MG PO CSDR
125.0000 mg | DELAYED_RELEASE_CAPSULE | Freq: Every day | ORAL | Status: DC
  Administered 2024-05-21 – 2024-05-26 (×6): 125 mg via ORAL
  Filled 2024-05-21 (×7): qty 1

## 2024-05-21 MED ORDER — ATENOLOL 25 MG PO TABS
25.0000 mg | ORAL_TABLET | Freq: Two times a day (BID) | ORAL | Status: AC
  Administered 2024-05-21 – 2024-05-26 (×11): 25 mg via ORAL
  Filled 2024-05-21 (×13): qty 1

## 2024-05-21 NOTE — Progress Notes (Signed)
 PHARMACY - ANTICOAGULATION CONSULT NOTE  Pharmacy Consult for Heparin  infusion  Indication: atrial fibrillation, Probable thrombus in the left atrial appendage   Allergies  Allergen Reactions   Codeine Anaphylaxis   Penicillins Anaphylaxis   Clonidine Derivatives Other (See Comments)    Unknown reaction   Cozaar  [Losartan ] Other (See Comments)    Myalgias    Lipitor [Atorvastatin] Other (See Comments)    Unknown reaction   Ms Contin [Morphine] Other (See Comments)    Unknown reaction   Neurontin  [Gabapentin ] Nausea Only   Reglan [Metoclopramide] Other (See Comments)    Tremors    Valtrex  [Valacyclovir ] Other (See Comments)    Tremors    Aldactone  [Spironolactone ] Itching   Apresoline  [Hydralazine ] Anxiety   Norvasc  [Amlodipine ] Itching    Does cause itching. Pt does take PRN if needed.    Patient Measurements: Height: 5' 6 (167.6 cm) Weight: 53.4 kg (117 lb 11.6 oz) IBW/kg (Calculated) : 59.3 HEPARIN  DW (KG): 53.1  Vital Signs: Temp: 97.2 F (36.2 C) (12/07 0400) Temp Source: Axillary (12/07 0400) BP: 176/67 (12/07 0625) Pulse Rate: 59 (12/07 0625)  Labs: Recent Labs    05/20/24 0820 05/20/24 1347 05/20/24 2146 05/21/24 0610  HGB 13.6  --   --  14.6  HCT 40.6  --   --  43.8  PLT 244  --   --  221  APTT  --  40*  --   --   LABPROT 14.9  --   --  16.7*  INR 1.1  --   --  1.3*  HEPARINUNFRC  --   --  0.62 0.68  CREATININE 0.57  --   --  0.47    Estimated Creatinine Clearance: 35.5 mL/min (by C-G formula based on SCr of 0.47 mg/dL).   Medical History: Past Medical History:  Diagnosis Date   Atrial fibrillation (HCC) 2012   Breast cancer (HCC) 04/2018   IDC of left breast    CHF (congestive heart failure) (HCC)    Colon polyp 2003   Compression fracture of L2 lumbar vertebra (HCC)    Coronary artery disease    COVID-19 virus infection 12/25/2019   Diabetes mellitus without complication (HCC)    Dyspnea    Dysrhythmia    atrial fib    Environmental and seasonal allergies    GERD (gastroesophageal reflux disease)    Hyperlipidemia    Hypertension    Hypertensive urgency 03/10/2021   Irritable bowel syndrome    Multiple rib fractures 03/15/2018   Personal history of radiation therapy    Polyp of colon    PONV (postoperative nausea and vomiting)    with ether, not recently woke up in surgery once   Presence of permanent cardiac pacemaker    Sick sinus syndrome (HCC)    Tremor    Tremors of nervous system    Ulcer     Medications:  Warfarin 5mg  daily   Assessment: 89 yo female presenting to ED with AMS. Patient has PMH CAD, sick sinus syndrome S/P pacemaker, A. fib on Coumadin , pulmonary hypertension, HTN, anxiety, and diabetes. CT Chest Abd showing Probable thrombus in the left atrial appendage. INR subtherapeutic on admission. Pharmacy has been consulted for heparin  infusion dosing and monitoring.   Goal of Therapy:  INR 2-3 Heparin  level 0.3-0.7 units/ml Monitor platelets by anticoagulation protocol: Yes   Plan:  12/7 @ 0610 = 0.68, therapeutic X 2 - will continue pt on current rate and recheck HL on 12/8 with AM labs  Continue to monitor H&H and platelets  Christain Mcraney D, PharmD Clinical Pharmacist 05/21/2024 6:51 AM

## 2024-05-21 NOTE — Progress Notes (Signed)
 Patient passed nurse swallow screen. Patient swallowed 3oz of water from cup without cough. MD aware.

## 2024-05-21 NOTE — Evaluation (Signed)
 Physical Therapy Evaluation Patient Details Name: Leah Holmes MRN: [id number] DOB: 05-17-1927 Today's Date: 05/21/2024  History of Present Illness  Pt is a 89 year old female presenting with acute encephalopathy from her nursing facility, work up includes mild hyponatremia, suspected sepsis,  Abnormal CT with possible left atrial thrombus currently on anticoagulation further evaluation with general require TEE (not currently recommended),     PMH significant for hypertension, hyperlipidemia, type 2 diabetes, atrial fibrillation on warfarin, sick sinus syndrome status post pacemaker.   Clinical Impression  Pt admitted with above diagnosis. Pt currently with functional limitations due to the deficits listed below (see PT Problem List). Pt received upright in bed agreeable to PT/OT co-eval with daughter present. Performing co-eval for pt/therapist safety and pt's likely poor tolerance for separate sessions. Pt is poor historian with pt's daughter providing history. Per daughter pt is mod-I with SPT to <> from manual W/c. Utilizes staff assistance with ADL's/IADL's.   To date, pt with worsening tremors in BUE's per pt's daughter report. Pt reliant on multimodal cues for bed mobility needing maxA+2 to sit EOB. Multiple STS performed EOB with HHA+2 and minA+2 at bedside. Pt with heavy posterior weight shift onto heels with mod to max multi modal cues required for equal weight distribution through feet with fair carryover. X2 posterior Lob leading to sitting EOB due to difficulty with forward lean however. Able to laterally step towards [facility] on last STS prior to return to supine in bed as pt declines sitting in recliner this date. Pt placed in chair position in bed with all needs in reach. Pt below current functional capacity with increased falls risk due to weakness and poor safety awareness. Unsafe to t/f without at least heavy 1 person assist for safety. Pt will benefit from skilled PT services < 3  hours/day to address acute deficits and maximize return to PLOF.      If plan is discharge home, recommend the following: Two people to help with walking and/or transfers;A lot of help with bathing/dressing/bathroom;Help with stairs or ramp for entrance;Assist for transportation   Can travel by private vehicle   No    Equipment Recommendations Other (comment) (TBD by next venue of care)  Recommendations for Other Services       Functional Status Assessment Patient has had a recent decline in their functional status and demonstrates the ability to make significant improvements in function in a reasonable and predictable amount of time.     Precautions / Restrictions Precautions Precautions: Fall Recall of Precautions/Restrictions: Impaired Restrictions Weight Bearing Restrictions Per Provider Order: No      Mobility  Bed Mobility Overal bed mobility: Needs Assistance Bed Mobility: Supine to Sit, Sit to Supine     Supine to sit: Max assist, HOB elevated, +2 for physical assistance Sit to supine: Max assist, +2 for physical assistance     Patient Response: Cooperative, Restless  Transfers Overall transfer level: Needs assistance Equipment used: 2 person hand held assist Transfers: Sit to/from Stand Sit to Stand: Min assist, +2 physical assistance, +2 safety/equipment           General transfer comment: lateral steps up the bed via 2 person HHA with CGA+2, frequent multi modal cues throughout due to posterior weight shift onto heels. x2 posterior LOB sitting back on EOB.    Ambulation/Gait               General Gait Details: unable at baseline  Stairs  Wheelchair Mobility     Tilt Bed Tilt Bed Patient Response: Cooperative, Restless  Modified Rankin (Stroke Patients Only)       Balance Overall balance assessment: Needs assistance Sitting-balance support: Feet supported Sitting balance-[name]: Fair   Postural control:  Posterior lean Standing balance support: Bilateral upper extremity supported, During functional activity Standing balance-[name]: Poor Standing balance comment: heavy posterior lean in standing. Needs mod to max VC's for anterior truncal lean. x2 posterior LOB to sitting on EOB.                             Pertinent Vitals/Pain Pain Assessment Pain Assessment: Faces Faces Pain Scale: No hurt    Home Living Family/patient expects to be discharged to:: Skilled nursing facility                   Additional Comments: Pt lives at LTC    Prior Function Prior Level of Function : Needs assist             Mobility Comments: pt transfers to mwc with supervision-MOD I per daugther with no AD, will amb a few steps with RW per her report ADLs Comments: assist for ADL/IADL at LTC. She does feed herself, perform grooming tasks, participates in dressing/bathing tasks     Extremity/Trunk Assessment   Upper Extremity Assessment Upper Extremity Assessment: Generalized weakness (has a baseline tremor, appears worse than normal per daugther)    Lower Extremity Assessment Lower Extremity Assessment: Generalized weakness       Communication   Communication Communication: Impaired Factors Affecting Communication: Hearing impaired    Cognition Arousal: Alert Behavior During Therapy: Restless   PT - Cognitive impairments: History of cognitive impairments                         Following commands: Impaired Following commands impaired: Follows one step commands with increased time     Cueing Cueing Techniques: Verbal cues, Tactile cues, Visual cues     General Comments General comments (skin integrity, edema, etc.): VSS during session. HR in low 100's    Exercises     Assessment/Plan    PT Assessment Patient needs continued PT services  PT Problem List Decreased strength;Decreased cognition;Decreased activity tolerance;Decreased  balance;Decreased mobility       PT Treatment Interventions DME instruction;Neuromuscular re-education;Stair training;Patient/family education;Functional mobility training;Wheelchair mobility training;Therapeutic activities;Therapeutic exercise;Balance training    PT Goals (Current goals can be found in the Care Plan section)  Acute Rehab PT Goals PT Goal Formulation: Patient unable to participate in goal setting Time For Goal Achievement: 06/04/24 Potential to Achieve Goals: Fair    Frequency Min 2X/week     Co-evaluation PT/OT/SLP Co-Evaluation/Treatment: Yes Reason for Co-Treatment: Necessary to address cognition/behavior during functional activity PT goals addressed during session: Mobility/safety with mobility;Balance OT goals addressed during session: ADL's and self-care       AM-PAC PT 6 Clicks Mobility  Outcome Measure Help needed turning from your back to your side while in a flat bed without using bedrails?: A Lot Help needed moving from lying on your back to sitting on the side of a flat bed without using bedrails?: A Lot Help needed moving to and from a bed to a chair (including a wheelchair)?: A Lot Help needed standing up from a chair using your arms (e.g., wheelchair or bedside chair)?: A Lot Help needed to walk in hospital room?: Total  Help needed climbing 3-5 steps with a railing? : Total 6 Click Score: 10    End of Session   Activity Tolerance: Patient tolerated treatment well Patient left: in bed;with call bell/phone within reach;with bed alarm set;with family/visitor present (in chair position) Nurse Communication: Mobility status PT Visit Diagnosis: Unsteadiness on feet (R26.81);Other abnormalities of gait and mobility (R26.89);Muscle weakness (generalized) (M62.81)    Time: 8645-8588 PT Time Calculation (min) (ACUTE ONLY): 17 min   Charges:   PT Evaluation $PT Eval Low Complexity: 1 Low   PT General Charges $$ ACUTE PT VISIT: 1 Visit        Dorina HERO. Fairly IV, PT, DPT Physical Therapist- [place]  [facility] 05/21/2024, 2:52 PM

## 2024-05-21 NOTE — Progress Notes (Signed)
  PROGRESS NOTE    Leah Holmes  FMW:[id number] DOB: August 18, 1926 DOA: 05/20/2024 PCP: Patient, No Pcp Per  251A/251A-AA  LOS: 1 day   Brief hospital course:   Assessment & Plan: Leah Holmes is a 89 y.o. year old female with medical history of hypertension, hyperlipidemia, type 2 diabetes, atrial fibrillation on warfarin, sick sinus syndrome status post pacemaker presenting from her nursing facility for worsening confusion.     Acute encephalopathy --etiology is not known. --daughter is against physical restraints  --hold home Xanax   Left atrial appendage:  Likely secondary to her A-fib and subtherapeutic INR.  Cardio consulted --cont heparin  gtt   Hypertension:  --resume home atenolol , Imdur  and Lisinopril    Type 2 diabetes:  --no need for BG checks   MDD/GAD:  Continue home meds when patient able to tolerate p.o. --hold home Xanax   Hyponatremia --Na low 130's.   DVT prophylaxis: On:heparin  gtt Code Status: DNR  Family Communication:  Level of care: Progressive Dispo:   The patient is from: SNF Anticipated d/c is to: SNF Anticipated d/c date is: 2-3 days   Subjective and Interval History:  Pt said nothing was bothering her.   Objective: Vitals:   05/21/24 0800 05/21/24 1100 05/21/24 1252 05/21/24 1708  BP:  (!) 188/60 (!) 188/60 (!) 161/66  Pulse:  68  85  Resp:  19  16  Temp: (!) 97.5 F (36.4 C) (!) 97.3 F (36.3 C)  98.8 F (37.1 C)  TempSrc: Axillary     SpO2: 99% 99%  98%  Weight:      Height:        Intake/Output Summary (Last 24 hours) at 05/21/2024 1939 Last data filed at 05/21/2024 1100 Gross per 24 hour  Intake 984.2 ml  Output 1050 ml  Net -65.8 ml   Filed Weights   05/20/24 0801 05/20/24 1940 05/21/24 0400  Weight: 59.3 kg 53.1 kg 53.4 kg    Examination:   Constitutional: NAD, sleeping, arousable, not oriented HEENT: conjunctivae and lids normal, EOMI CV: No cyanosis.   RESP: normal respiratory effort,  on RA   Data Reviewed: I have personally reviewed labs and imaging studies  Time spent: 50 minutes  Ellouise Haber, MD [facility] If 7PM-7AM, please contact night-coverage 05/21/2024, 7:39 PM

## 2024-05-21 NOTE — Evaluation (Signed)
 Occupational Therapy Evaluation Patient Details Name: Leah Holmes MRN: [id number] DOB: 31-Mar-1927 Today's Date: 05/21/2024   History of Present Illness   Pt is a 89 year old female presenting with acute encephalopathy from her nursing facility, work up includes mild hyponatremia, suspected sepsis,  Abnormal CT with possible left atrial thrombus currently on anticoagulation further evaluation with general require TEE (not currently recommended),     PMH significant for hypertension, hyperlipidemia, type 2 diabetes, atrial fibrillation on warfarin, sick sinus syndrome status post pacemaker     Clinical Impressions Chart reviewed to date, pt greeted semi supine in bed, oriented to self and restless. Per her daughter cognitive baseline is altered. PTA pt lives at LTC, but performs ADLs with assist from staff as needed, has assist for IADLs, she transfers into mwc and will amb short distances with RW with MOD I-supervision per daughter. Pt presents with deficits in strength, endurance,activity tolerance, balance, cognition, affecting safe and optimal ADL completion. Please see further details below. Pt is performing ADL belwo PLOF, will benefit from acute OT to address functional deficits and to facilitate optimal ADL performance. Educated pt/daughter on delirium precautions, cabin crew to order posey activity pad. Pt is left as received, appears less restless after mobility attempts, all needs met. OT will follow.      If plan is discharge home, recommend the following:   A lot of help with walking and/or transfers;A lot of help with bathing/dressing/bathroom;Supervision due to cognitive status     Functional Status Assessment   Patient has had a recent decline in their functional status and demonstrates the ability to make significant improvements in function in a reasonable and predictable amount of time.     Equipment Recommendations   Other (comment) (defer to next  venue of care)     Recommendations for Other Services         Precautions/Restrictions   Precautions Precautions: Fall Recall of Precautions/Restrictions: Impaired Restrictions Weight Bearing Restrictions Per Provider Order: No     Mobility Bed Mobility Overal bed mobility: Needs Assistance Bed Mobility: Supine to Sit, Sit to Supine     Supine to sit: Max assist, HOB elevated Sit to supine: Max assist        Transfers Overall transfer level: Needs assistance Equipment used: 2 person hand held assist Transfers: Sit to/from Stand Sit to Stand: Min assist, +2 physical assistance, +2 safety/equipment           General transfer comment: lateral steps up the bed via 2 person HHA with CGA+2, frequent multi modal cues throughout      Balance Overall balance assessment: Needs assistance Sitting-balance support: Feet supported Sitting balance-[name]: Fair     Standing balance support: Bilateral upper extremity supported, During functional activity Standing balance-[name]: Fair                             ADL either performed or assessed with clinical judgement   ADL Overall ADL's : Needs assistance/impaired                     Lower Body Dressing: Maximal assistance;Bed level Lower Body Dressing Details (indicate cue type and reason): donn socks                     Vision Baseline Vision/History: 1 Wears glasses Additional Comments: will continue to assess     Perception  Praxis         Pertinent Vitals/Pain Pain Assessment Pain Assessment: PAINAD Breathing: normal Negative Vocalization: occasional moan/groan, low speech, negative/disapproving quality Facial Expression: sad, frightened, frown Body Language: tense, distressed pacing, fidgeting Consolability: distracted or reassured by voice/touch PAINAD Score: 4 Pain Intervention(s): Monitored during session, Repositioned     Extremity/Trunk  Assessment Upper Extremity Assessment Upper Extremity Assessment: Generalized weakness (has a baseline tremor, appears worse than normal per daugther)   Lower Extremity Assessment Lower Extremity Assessment: Generalized weakness       Communication Communication Communication: Impaired Factors Affecting Communication: Hearing impaired   Cognition Arousal: Alert Behavior During Therapy: Restless Cognition: Cognition impaired   Orientation impairments: Place, Time, Situation Awareness: Intellectual awareness impaired, Online awareness impaired Memory impairment (select all impairments): Short-term memory, Working civil service fast streamer, Engineer, structural memory Attention impairment (select first level of impairment): Focused attention Executive functioning impairment (select all impairments): Initiation, Sequencing, Organization, Reasoning, Problem solving OT - Cognition Comments: altered from baseline per daugther                 Following commands: Impaired Following commands impaired: Follows one step commands with increased time (with multi modal cues)     Cueing  General Comments   Cueing Techniques: Verbal cues;Tactile cues;Visual cues  vss   Exercises Other Exercises Other Exercises: edu pt/daugther re role of OT, role of rehab, delerium precautions   Shoulder Instructions      Home Living Family/patient expects to be discharged to:: Skilled nursing facility                                 Additional Comments: Pt lives at LTC      Prior Functioning/Environment Prior Level of Function : Needs assist             Mobility Comments: pt transfers to mwc with supervision-MOD I per daugther with no AD, will amb a few steps with RW per her report ADLs Comments: assist for ADL/IADL at LTC. She does feed herself, perform grooming tasks, participates in dressing/bathing tasks    OT Problem List: Decreased strength;Decreased activity tolerance;Impaired  balance (sitting and/or standing);Decreased knowledge of use of DME or AE;Decreased safety awareness;Decreased cognition;Decreased coordination   OT Treatment/Interventions: Self-care/ADL training;Therapeutic exercise;DME and/or AE instruction;Balance training;Patient/family education;Energy conservation;Therapeutic activities;Cognitive remediation/compensation      OT Goals(Current goals can be found in the care plan section)   Acute Rehab OT Goals Patient Stated Goal: improve functoin OT Goal Formulation: With patient/family Time For Goal Achievement: 06/04/24 Potential to Achieve Goals: Good ADL Goals Pt Will Perform Grooming: with supervision;sitting Pt Will Perform Lower Body Dressing: with supervision;sit to/from stand;sitting/lateral leans Pt Will Transfer to Toilet: with supervision;stand pivot transfer Pt Will Perform Toileting - Clothing Manipulation and hygiene: with supervision;sitting/lateral leans;sit to/from stand   OT Frequency:  Min 2X/week    Co-evaluation PT/OT/SLP Co-Evaluation/Treatment: Yes Reason for Co-Treatment: Necessary to address cognition/behavior during functional activity   OT goals addressed during session: ADL's and self-care      AM-PAC OT 6 Clicks Daily Activity     Outcome Measure Help from another person eating meals?: A Little Help from another person taking care of personal grooming?: A Little Help from another person toileting, which includes using toliet, bedpan, or urinal?: A Lot Help from another person bathing (including washing, rinsing, drying)?: A Lot Help from another person to put on and taking off regular upper body clothing?: A  Little Help from another person to put on and taking off regular lower body clothing?: A Lot 6 Click Score: 15   End of Session Nurse Communication: Mobility status  Activity Tolerance: Patient tolerated treatment well Patient left: in bed;with call bell/phone within reach;with bed alarm set;with  family/visitor present  OT Visit Diagnosis: Other abnormalities of gait and mobility (R26.89);Muscle weakness (generalized) (M62.81);Other symptoms and signs involving cognitive function                Time: 1353-1411 OT Time Calculation (min): 18 min Charges:  OT General Charges $OT Visit: 1 Visit OT Evaluation $OT Eval Moderate Complexity: 1 Mod Therisa Sheffield, OTD OTR/L  05/21/24, 2:32 PM

## 2024-05-21 NOTE — Progress Notes (Signed)
 Patient ID: Leah Holmes, female   DOB: 03-07-1927, 89 y.o.   MRN: [id number] [facility]    SUBJECTIVE: Patient more alert today denies any pain still appears to have some confusion no shortness of breath no fever chills or sweats   Vitals:   05/21/24 0700 05/21/24 0800 05/21/24 1100 05/21/24 1252  BP: (!) 163/103  (!) 188/60 (!) 188/60  Pulse: (!) 59  68   Resp: 18  19   Temp:  (!) 97.5 F (36.4 C) (!) 97.3 F (36.3 C)   TempSrc:  Axillary    SpO2: 99% 99% 99%   Weight:      Height:         Intake/Output Summary (Last 24 hours) at 05/21/2024 1359 Last data filed at 05/21/2024 1100 Gross per 24 hour  Intake 1034.2 ml  Output 1050 ml  Net -15.8 ml      PHYSICAL EXAM  General: Well developed, well nourished, in no acute distress HEENT:  Normocephalic and atramatic Neck:  No JVD.  Lungs: Clear bilaterally to auscultation and percussion. Heart: HRRR . Normal S1 and S2 without gallops or murmurs.  Abdomen: Bowel sounds are positive, abdomen soft and non-tender  Msk:  Back normal, normal gait. Normal strength and tone for age. Extremities: No clubbing, cyanosis or edema.   Neuro: Alert and oriented X 3. Psych:  Good affect, responds appropriately   LABS: Basic Metabolic Panel: Recent Labs    05/20/24 0820 05/21/24 0610  NA 134* 134*  K 4.2 3.9  CL 98 99  CO2 25 24  GLUCOSE 141* 105*  BUN 14 10  CREATININE 0.57 0.47  CALCIUM  9.4 9.5  MG 1.9  --    Liver Function Tests: Recent Labs    05/20/24 0820  AST 21  ALT 13  ALKPHOS 59  BILITOT 0.7  PROT 7.1  ALBUMIN 4.2   No results for input(s): LIPASE, AMYLASE in the last 72 hours. CBC: Recent Labs    05/20/24 0820 05/21/24 0610  WBC 7.8 6.7  NEUTROABS 5.5  --   HGB 13.6 14.6  HCT 40.6 43.8  MCV 95.5 97.8  PLT 244 221   Cardiac Enzymes: No results for input(s): CKTOTAL, CKMB, CKMBINDEX, TROPONINI in the last 72 hours. BNP: Invalid input(s): POCBNP D-Dimer: No results  for input(s): DDIMER in the last 72 hours. Hemoglobin A1C: No results for input(s): HGBA1C in the last 72 hours. Fasting Lipid Panel: No results for input(s): CHOL, HDL, LDLCALC, TRIG, CHOLHDL, LDLDIRECT in the last 72 hours. Thyroid  Function Tests: No results for input(s): TSH, T4TOTAL, T3FREE, THYROIDAB in the last 72 hours.  Invalid input(s): FREET3 Anemia Panel: No results for input(s): VITAMINB12, FOLATE, FERRITIN, TIBC, IRON, RETICCTPCT in the last 72 hours.  ECHOCARDIOGRAM COMPLETE Result Date: 05/20/2024    ECHOCARDIOGRAM REPORT   Patient Name:   Leah Holmes Date of Exam: 05/20/2024 Medical Rec #:  [id number]               Height:       66.0 in Accession #:    [id number]              Weight:       130.7 lb Date of Birth:  1927-04-14               BSA:          1.669 m Patient Age:    89 years  BP:           176/69 mmHg Patient Gender: F                       HR:           71 bpm. Exam Location:  [facility] Procedure: 2D Echo, Cardiac Doppler and Color Doppler (Both Spectral and Color            Flow Doppler were utilized during procedure). STAT ECHO Indications:     Thrombus of atrial appendage  History:         Patient has prior history of Echocardiogram examinations, most                  recent 10/26/2022.  Sonographer:     Thedora Louder RDCS, FASE Referring Phys:  [id number] MORENE BATHE Diagnosing Phys: Cara JONETTA Lovelace MD  Sonographer Comments: Technically challenging study due to limited acoustic windows and suboptimal apical window. Image acquisition challenging due to uncooperative patient and Image acquisition challenging due to patient behavioral factors. Very difficult study to complete, despite patient given ativan . IMPRESSIONS  1. Left ventricular ejection fraction, by estimation, is 55 to 60%. The left ventricle has normal function. The left ventricle demonstrates regional wall motion abnormalities (see scoring  diagram/findings for description). Left ventricular diastolic parameters are consistent with Grade III diastolic dysfunction (restrictive).  2. Right ventricular systolic function is normal. The right ventricular size is mildly enlarged.  3. Poor windows , no thrombus seen.. Left atrial size was severely dilated.  4. Right atrial size was mild to moderately dilated.  5. 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  6. Tricuspid valve regurgitation is mild to moderate.  7. The aortic valve is calcified. Aortic valve regurgitation is trivial. Severe aortic valve stenosis. FINDINGS  Left Ventricle: Left ventricular ejection fraction, by estimation, is 55 to 60%. The left ventricle has normal function. The left ventricle demonstrates regional wall motion abnormalities. Strain was performed and the global longitudinal strain is indeterminate. The left ventricular internal cavity size was normal in size. There is borderline concentric left ventricular hypertrophy. Left ventricular diastolic parameters are consistent with Grade III diastolic dysfunction (restrictive). Right Ventricle: The right ventricular size is mildly enlarged. No increase in right ventricular wall thickness. Right ventricular systolic function is normal. Left Atrium: Poor windows , no thrombus seen. Left atrial size was severely dilated. Right Atrium: Right atrial size was mild to moderately dilated. Pericardium: There is no evidence of pericardial effusion. Mitral Valve: The mitral valve is myxomatous. There is moderate thickening of the mitral valve leaflet(s). There is moderate calcification of the mitral valve leaflet(s). Mild to moderate mitral valve regurgitation. MV peak gradient, 9.6 mmHg. The mean mitral valve gradient is 3.0 mmHg. Tricuspid Valve: The tricuspid valve is normal in structure. Tricuspid valve regurgitation is mild to moderate. Aortic Valve: The aortic valve is calcified. Aortic valve regurgitation  is trivial. Severe aortic stenosis is present. Aortic valve mean gradient measures 38.5 mmHg. Aortic valve peak gradient measures 68.2 mmHg. Aortic valve area, by VTI measures 0.29 cm. Pulmonic Valve: The pulmonic valve was not well visualized. Pulmonic valve regurgitation is trivial. Aorta: The ascending aorta was not well visualized. IAS/Shunts: No atrial level shunt detected by color flow Doppler. Additional Comments: 3D was performed not requiring image post processing on an independent workstation and was indeterminate. A device lead is visualized.  LEFT VENTRICLE PLAX 2D LVIDd:  3.70 cm   Diastology LVIDs:         2.60 cm   LV e' medial:    5.22 cm/s LV PW:         1.10 cm   LV E/e' medial:  31.6 LV IVS:        1.20 cm   LV e' lateral:   7.62 cm/s LVOT diam:     1.80 cm   LV E/e' lateral: 21.7 LV SV:         29 LV SV Index:   17 LVOT Area:     2.54 cm  LEFT ATRIUM           Index LA diam:      5.50 cm 3.30 cm/m LA Vol (A4C): 87.0 ml 52.13 ml/m  AORTIC VALVE                     PULMONIC VALVE AV Area (Vmax):    0.33 cm      PV Vmax:          0.76 m/s AV Area (Vmean):   0.31 cm      PV Peak grad:     2.3 mmHg AV Area (VTI):     0.29 cm      PR End Diast Vel: 6.97 msec AV Vmax:           413.00 cm/s   RVOT Peak grad:   2 mmHg AV Vmean:          289.500 cm/s AV VTI:            1.005 m AV Peak Grad:      68.2 mmHg AV Mean Grad:      38.5 mmHg LVOT Vmax:         54.10 cm/s LVOT Vmean:        35.600 cm/s LVOT VTI:          0.114 m LVOT/AV VTI ratio: 0.11  AORTA Ao Root diam: 3.00 cm Ao Asc diam:  2.80 cm MITRAL VALVE                TRICUSPID VALVE MV Area (PHT): 4.21 cm     TR Peak grad:   30.9 mmHg MV Area VTI:   0.96 cm     TR Vmax:        278.00 cm/s MV Peak grad:  9.6 mmHg MV Mean grad:  3.0 mmHg     SHUNTS MV Vmax:       1.55 m/s     Systemic VTI:  0.11 m MV Vmean:      75.0 cm/s    Systemic Diam: 1.80 cm MV Decel Time: 180 msec MV E velocity: 165.00 cm/s MV A velocity: 68.10 cm/s MV E/A ratio:   2.42 Analyah Mcconnon D Che Rachal MD Electronically signed by Cara JONETTA Lovelace MD Signature Date/Time: 05/20/2024/5:13:11 PM    Final    CT CHEST ABDOMEN PELVIS W CONTRAST Result Date: 05/20/2024 EXAM: CT CHEST, ABDOMEN AND PELVIS WITH CONTRAST 05/20/2024 11:50:50 AM TECHNIQUE: CT of the chest, abdomen and pelvis was performed with the administration of intravenous contrast. 100 mL of iohexol  (OMNIPAQUE ) 300 MG/ML solution was administered. Multiplanar reformatted images are provided for review. Automated exposure control, iterative reconstruction, and/or weight based adjustment of the mA/kV was utilized to reduce the radiation dose to as low as reasonably achievable. COMPARISON: 11/01/2022 status post cholecystectomy. CLINICAL HISTORY: ams, fever FINDINGS: CHEST: MEDIASTINUM AND LYMPH NODES: Aortic and mitral  valve calcifications are noted. Probable thrombus is noted in the left atrial appendage. Coronary artery calcifications are noted. Pericardium is unremarkable. The central airways are clear. No mediastinal, hilar or axillary lymphadenopathy. LUNGS AND PLEURA: Small bilateral pleural effusions are noted with minimal adjacent sub-some atelectasis. No focal consolidation or pulmonary edema. No pneumothorax. ABDOMEN AND PELVIS: LIVER: The liver is unremarkable. GALLBLADDER AND BILE DUCTS: Status post cholecystectomy. No biliary ductal dilatation. SPLEEN: No acute abnormality. PANCREAS: No acute abnormality. ADRENAL GLANDS: No acute abnormality. KIDNEYS, URETERS AND BLADDER: No stones in the kidneys or ureters. No hydronephrosis. No perinephric or periureteral stranding. Urinary bladder is unremarkable. GI AND BOWEL: Stomach demonstrates no acute abnormality. There is no bowel obstruction. REPRODUCTIVE ORGANS: No acute abnormality. PERITONEUM AND RETROPERITONEUM: No ascites. No free air. VASCULATURE: Aorta is normal in caliber. Aortic atherosclerosis. ABDOMINAL AND PELVIS LYMPH NODES: No lymphadenopathy. BONES AND SOFT  TISSUES: Old L1 and L2 compression fractures are noted. No acute osseous abnormality. No focal soft tissue abnormality. IMPRESSION: 1. Probable thrombus in the left atrial appendage. 2. Small bilateral pleural effusions with minimal adjacent subsegmental atelectasis. Electronically signed by: Lynwood Seip MD 05/20/2024 12:28 PM EST RP Workstation: [id number]   CT HEAD WO CONTRAST ( ) Result Date: 05/20/2024 EXAM: CT HEAD WITHOUT CONTRAST 05/20/2024 10:40:00 AM TECHNIQUE: CT of the head was performed without the administration of intravenous contrast. Automated exposure control, iterative reconstruction, and/or weight based adjustment of the mA/kV was utilized to reduce the radiation dose to as low as reasonably achievable. COMPARISON: CT head without contrast 10/24/2022. CLINICAL HISTORY: Altered mental status. FINDINGS: BRAIN AND VENTRICLES: No acute hemorrhage. A remote lacunar infarct is present in the right lateral nucleus. Moderate atrophy and white matter changes are similar to the prior exam. Atherosclerotic calcifications are present in the cavernous carotid arteries bilaterally and at the dural margin of both vertebral arteries. No hyperdense vessel is present. No hydrocephalus. No extra-axial collection. No mass effect or midline shift. ORBITS: A left lens replacement is present. SINUSES: No acute abnormality. SOFT TISSUES AND SKULL: No acute soft tissue abnormality. No skull fracture. IMPRESSION: 1. No acute intracranial abnormality. 2. Remote lacunar infarct in the right lateral nucleus. 3. Moderate cerebral atrophy and white matter changes, similar to the prior exam. 4. Atherosclerotic calcifications in the cavernous carotid arteries bilaterally and at the dural margins of both vertebral arteries. Electronically signed by: Lonni Necessary MD 05/20/2024 10:51 AM EST RP Workstation: [id number]   DG Chest 1 View Result Date: 05/20/2024 EXAM: 1 VIEW(S) XRAY OF THE CHEST 05/20/2024 08:30:00 AM  COMPARISON: 10/24/2022 CLINICAL HISTORY: ams FINDINGS: LUNGS AND PLEURA: No focal pulmonary opacity. No pleural effusion. No pneumothorax. HEART AND MEDIASTINUM: Left single lead cardiac device in place with lead in right ventricle. Aortic atherosclerosis. Borderline cardiomegaly. BONES AND SOFT TISSUES: No acute osseous abnormality. IMPRESSION: 1. No acute cardiopulmonary process. 2. Borderline cardiomegaly. 3. Aortic atherosclerosis. Electronically signed by: Waddell Calk MD 05/20/2024 08:45 AM EST RP Workstation: [id number]     Echo ejection fraction 55 to 60%  TELEMETRY: Paced rhythm around 85:  ASSESSMENT AND PLAN:  Principal Problem:   Acute encephalopathy Active Problems:   DM type 2 with diabetic peripheral neuropathy (HCC)   Hyperlipidemia associated with type 2 diabetes mellitus (HCC)   Essential hypertension, benign   Chronic atrial fibrillation (HCC)   Sick sinus syndrome (HCC)    Plan Acute mental status with acute metabolic encephalopathy somewhat improved continue supportive care Aggressive heart failure agree with medical therapy to help with dyspnea  shortness of breath Atrial fibrillation continue rate control anticoagulation as necessary recommend Eliquis  2.5 twice a day Hyperlipidemia maintain statin therapy for hyperlipidemia Sick sinus syndrome permanent pacemaker in place outpatient follow-up with device clinic Continue conservative medical therapy at this point   Cara JONETTA Lovelace, MD, 05/21/2024 1:59 PM

## 2024-05-21 NOTE — Progress Notes (Signed)
 Patient admitted to PCU from ICU. Upon arrival, patient placed on continuous cardiac telemetry monitoring. Vital signs obtained and documented. Patient oriented to room, call bell system, and unit routines. Safety measures implemented: bed in lowest position, call bell in reach, and side rails up x3. Patient repositioned for comfort and skin protection. Initial head-to-toes assessment completed. Patient resting comfortably in bed, no acute distress noted at this time.

## 2024-05-22 ENCOUNTER — Other Ambulatory Visit (HOSPITAL_COMMUNITY): Payer: Self-pay

## 2024-05-22 ENCOUNTER — Inpatient Hospital Stay

## 2024-05-22 DIAGNOSIS — R569 Unspecified convulsions: Secondary | ICD-10-CM

## 2024-05-22 DIAGNOSIS — R4182 Altered mental status, unspecified: Secondary | ICD-10-CM

## 2024-05-22 LAB — CBC
HCT: 41.3 % (ref 36.0–46.0)
Hemoglobin: 14 g/dL (ref 12.0–15.0)
MCH: 32 pg (ref 26.0–34.0)
MCHC: 33.9 g/dL (ref 30.0–36.0)
MCV: 94.3 fL (ref 80.0–100.0)
Platelets: 248 K/uL (ref 150–400)
RBC: 4.38 MIL/uL (ref 3.87–5.11)
RDW: 12.2 % (ref 11.5–15.5)
WBC: 9.4 K/uL (ref 4.0–10.5)
nRBC: 0 % (ref 0.0–0.2)

## 2024-05-22 LAB — HEPARIN LEVEL (UNFRACTIONATED): Heparin Unfractionated: 0.64 [IU]/mL (ref 0.30–0.70)

## 2024-05-22 LAB — GLUCOSE, CAPILLARY: Glucose-Capillary: 188 mg/dL — ABNORMAL HIGH (ref 70–99)

## 2024-05-22 MED ORDER — HALOPERIDOL LACTATE 5 MG/ML IJ SOLN
2.0000 mg | Freq: Two times a day (BID) | INTRAMUSCULAR | Status: DC | PRN
  Administered 2024-05-22: 2 mg via INTRAVENOUS
  Filled 2024-05-22: qty 1

## 2024-05-22 MED ORDER — ENSURE PLUS HIGH PROTEIN PO LIQD
237.0000 mL | Freq: Two times a day (BID) | ORAL | Status: DC
  Administered 2024-05-24 – 2024-05-26 (×5): 237 mL via ORAL

## 2024-05-22 MED ORDER — LORAZEPAM 2 MG/ML IJ SOLN
1.0000 mg | Freq: Four times a day (QID) | INTRAMUSCULAR | Status: DC | PRN
  Administered 2024-05-22 – 2024-05-23 (×3): 1 mg via INTRAVENOUS
  Filled 2024-05-22 (×3): qty 1

## 2024-05-22 MED ORDER — APIXABAN 2.5 MG PO TABS
2.5000 mg | ORAL_TABLET | Freq: Two times a day (BID) | ORAL | Status: DC
  Administered 2024-05-22 – 2024-05-26 (×10): 2.5 mg via ORAL
  Filled 2024-05-22 (×10): qty 1

## 2024-05-22 NOTE — Progress Notes (Signed)
 Eeg done

## 2024-05-22 NOTE — [place] FL2 (Signed)
 [place]  MEDICAID FL2 LEVEL OF CARE FORM     IDENTIFICATION  Patient Name: Leah Holmes Birthdate: 1926/10/21 Sex: female Admission Date (Current Location): 05/20/2024  [facility] and [place] Number:  Chiropodist and Address:  [facility], [address], [place], [place] [address]      Provider Number: [id number]  Attending Physician Name and Address:  Awanda City, MD  Relative Name and Phone Number:       Current Level of Care: Hospital Recommended Level of Care: Nursing Facility Prior Approval Number:    Date Approved/Denied:   PASRR Number: [id number]  Discharge Plan: SNF    Current Diagnoses: Patient Active Problem List   Diagnosis Date Noted   Acute encephalopathy 05/20/2024   Protein-calorie malnutrition, severe 10/30/2022   Hip fracture (HCC) 10/25/2022   Intractable headache 10/24/2022   Hallucination, visual 10/24/2022   Aortic stenosis 10/24/2022   Acute nonintractable headache 10/23/2022   Hallucination 10/23/2022   Allergic rhinitis 08/11/2022   Hypomagnesemia 06/11/2022   Hospital discharge follow-up 06/11/2022   Generalized weakness 05/21/2022   Rib pain on right side 04/08/2022   Left foot pain 04/08/2022   Counseling regarding end of life decision making 11/01/2021   Glucosuria 09/01/2021   Vitamin D  deficiency 05/27/2021   History of COVID-19 04/24/2021   QT prolongation    Back pain 03/10/2021   Myalgia due to statin 09/15/2020   Acquired thrombophilia 12/25/2019   Pacemaker 09/08/2019   Invasive ductal carcinoma of breast, female, left (HCC) 04/26/2018   Bilateral carotid artery stenosis 04/20/2018   Aortic atherosclerosis 03/15/2018   Balance problem 03/15/2018   History of fall 03/15/2018   Intention tremor 04/15/2017   Osteoporosis 03/21/2016   Sick sinus syndrome (HCC) 01/28/2016   Other malaise 11/01/2015   Constipation 07/27/2015   Chronic atrial fibrillation (HCC) 08/16/2014    Medicare annual wellness visit, subsequent 05/23/2014   Moderate mitral insufficiency 05/08/2014   Moderate tricuspid insufficiency 05/08/2014   Dry mouth 03/10/2014   Low serum vitamin D  02/16/2014   LVH (left ventricular hypertrophy) 11/23/2013   Anal polyp 09/20/2013   History of colonic polyps 08/31/2013   CAD (coronary artery disease) 03/09/2013   DM type 2 with diabetic peripheral neuropathy (HCC) 03/09/2013   Hyperlipidemia associated with type 2 diabetes mellitus (HCC) 03/09/2013   Essential hypertension, benign 03/09/2013   Nonulcer dyspepsia 03/09/2013    Orientation RESPIRATION BLADDER Height & Weight      (disoriented)  Normal Incontinent Weight: 50.7 kg Height:  5' 6 (167.6 cm)  BEHAVIORAL SYMPTOMS/MOOD NEUROLOGICAL BOWEL NUTRITION STATUS   (None)   Incontinent Diet (dysphagia 2)  AMBULATORY STATUS COMMUNICATION OF NEEDS Skin   Extensive Assist Verbally Normal                       Personal Care Assistance Level of Assistance  Bathing, Feeding, Dressing Bathing Assistance: Maximum assistance Feeding assistance: Maximum assistance Dressing Assistance: Maximum assistance     Functional Limitations Info  Sight, Hearing, Speech Sight Info: Adequate Hearing Info: Adequate Speech Info: Adequate    SPECIAL CARE FACTORS FREQUENCY  PT (By licensed PT), OT (By licensed OT)     PT Frequency: 5x per week OT Frequency: 5x per week            Contractures Contractures Info: Not present    Additional Factors Info  Code Status, Allergies Code Status Info: DNR Allergies Info: Codeine, Penicillins, Clonidine Derivatives, Cozaar  (Losartan ), Lipitor (Atorvastatin),  Ms Contin (Morphine), Neurontin  (Gabapentin ), Reglan (Metoclopramide), Valtrex  (Valacyclovir ), Aldactone  (Spironolactone ), Apresoline  (Hydralazine ), Norvasc  (Amlodipine )           Current Medications (05/22/2024):  This is the current hospital active medication list Current Facility-Administered  Medications  Medication Dose Route Frequency Provider Last Rate Last Admin   acetaminophen  (TYLENOL ) tablet 650 mg  650 mg Oral Q6H PRN Fernand Prost, MD       Or   acetaminophen  (TYLENOL ) suppository 650 mg  650 mg Rectal Q6H PRN Fernand Prost, MD       apixaban  (ELIQUIS ) tablet 2.5 mg  2.5 mg Oral BID Hudson, Caralyn, PA-C   2.5 mg at 05/22/24 9167   atenolol  (TENORMIN ) tablet 25 mg  25 mg Oral BID Awanda City, MD   25 mg at 05/22/24 1453   divalproex  (DEPAKOTE  SPRINKLE) capsule 125 mg  125 mg Oral QHS Awanda City, MD   125 mg at 05/21/24 2140   feeding supplement (ENSURE PLUS HIGH PROTEIN) liquid 237 mL  237 mL Oral BID BM Awanda City, MD       haloperidol  lactate (HALDOL ) injection 2 mg  2 mg Intravenous BID PRN Awanda City, MD   2 mg at 05/22/24 0829   hydrALAZINE  (APRESOLINE ) injection 10 mg  10 mg Intravenous Q6H PRN Awanda City, MD   10 mg at 05/21/24 1252   isosorbide  mononitrate (IMDUR ) 24 hr tablet 60 mg  60 mg Oral Daily Awanda City, MD   60 mg at 05/22/24 9167   lisinopril  (ZESTRIL ) tablet 40 mg  40 mg Oral Daily Awanda City, MD   40 mg at 05/22/24 9167   LORazepam  (ATIVAN ) injection 1 mg  1 mg Intravenous Q6H PRN Awanda City, MD   1 mg at 05/22/24 1530   Oral care mouth rinse  15 mL Mouth Rinse PRN Fernand Prost, MD       senna-docusate (Senokot-S) tablet 1 tablet  1 tablet Oral QHS PRN Khan, Ghalib, MD         Discharge Medications: Please see discharge summary for a list of discharge medications.  Relevant Imaging Results:  Relevant Lab Results:   Additional Information SS#: [id number]  Shasta DELENA Daring, RN

## 2024-05-22 NOTE — Plan of Care (Signed)
  Problem: Safety: Goal: Non-violent Restraint(s) Outcome: Progressing   Problem: Education: Goal: Knowledge of General Education information will improve Description: Including pain rating scale, medication(s)/side effects and non-pharmacologic comfort measures Outcome: Progressing   Problem: Health Behavior/Discharge Planning: Goal: Ability to manage health-related needs will improve Outcome: Progressing   Problem: Clinical Measurements: Goal: Ability to maintain clinical measurements within normal limits will improve Outcome: Progressing Goal: Will remain free from infection Outcome: Progressing Goal: Diagnostic test results will improve Outcome: Progressing Goal: Respiratory complications will improve Outcome: Progressing Goal: Cardiovascular complication will be avoided Outcome: Progressing   Problem: Activity: Goal: Risk for activity intolerance will decrease Outcome: Progressing   Problem: Nutrition: Goal: Adequate nutrition will be maintained Outcome: Progressing   Problem: Coping: Goal: Level of anxiety will decrease Outcome: Progressing   Problem: Elimination: Goal: Will not experience complications related to bowel motility Outcome: Progressing Goal: Will not experience complications related to urinary retention Outcome: Progressing   Problem: Pain Managment: Goal: General experience of comfort will improve and/or be controlled Outcome: Progressing   Problem: Safety: Goal: Ability to remain free from injury will improve Outcome: Progressing   Problem: Skin Integrity: Goal: Risk for impaired skin integrity will decrease Outcome: Progressing   Problem: Education: Goal: Ability to describe self-care measures that may prevent or decrease complications (Diabetes Survival Skills Education) will improve Outcome: Progressing Goal: Individualized Educational Video(s) Outcome: Progressing   Problem: Coping: Goal: Ability to adjust to condition or change  in health will improve Outcome: Progressing   Problem: Fluid Volume: Goal: Ability to maintain a balanced intake and output will improve Outcome: Progressing   Problem: Health Behavior/Discharge Planning: Goal: Ability to identify and utilize available resources and services will improve Outcome: Progressing Goal: Ability to manage health-related needs will improve Outcome: Progressing   Problem: Metabolic: Goal: Ability to maintain appropriate glucose levels will improve Outcome: Progressing   Problem: Nutritional: Goal: Maintenance of adequate nutrition will improve Outcome: Progressing Goal: Progress toward achieving an optimal weight will improve Outcome: Progressing   Problem: Skin Integrity: Goal: Risk for impaired skin integrity will decrease Outcome: Progressing   Problem: Tissue Perfusion: Goal: Adequacy of tissue perfusion will improve Outcome: Progressing   Problem: Education: Goal: Knowledge of disease or condition will improve Outcome: Progressing Goal: Understanding of medication regimen will improve Outcome: Progressing Goal: Individualized Educational Video(s) Outcome: Progressing   Problem: Activity: Goal: Ability to tolerate increased activity will improve Outcome: Progressing   Problem: Cardiac: Goal: Ability to achieve and maintain adequate cardiopulmonary perfusion will improve Outcome: Progressing   Problem: Health Behavior/Discharge Planning: Goal: Ability to safely manage health-related needs after discharge will improve Outcome: Progressing   Problem: Education: Goal: Ability to demonstrate management of disease process will improve Outcome: Progressing Goal: Ability to verbalize understanding of medication therapies will improve Outcome: Progressing Goal: Individualized Educational Video(s) Outcome: Progressing   Problem: Activity: Goal: Capacity to carry out activities will improve Outcome: Progressing   Problem:  Cardiac: Goal: Ability to achieve and maintain adequate cardiopulmonary perfusion will improve Outcome: Progressing

## 2024-05-22 NOTE — Progress Notes (Signed)
  PROGRESS NOTE    Leah Holmes  FMW:[id number] DOB: 02-15-1927 DOA: 05/20/2024 PCP: Patient, No Pcp Per  251A/251A-AA  LOS: 2 days   Brief hospital course:   Assessment & Plan: Leah Holmes is a 89 y.o. year old female with medical history of hypertension, hyperlipidemia, type 2 diabetes, atrial fibrillation on warfarin, sick sinus syndrome status post pacemaker presenting from her nursing facility for worsening confusion.     Acute encephalopathy --etiology is not known.   --daughter is against physical restraints  --IV haldol , IV ativan  prn  Possible Left atrial appendage:  --seen on CT c/a/p but not on current Echo --transition from heparin  gtt to Eliquis    Hypertension:  --cont home atenolol , Imdur  and Lisinopril    Type 2 diabetes:  --no need for BG checks   MDD/GAD:  Continue home meds when patient able to tolerate p.o.  Hyponatremia --Na low 130's.   DVT prophylaxis: On:Eliquis  Code Status: DNR  Family Communication:  Level of care: Med-Surg Dispo:   The patient is from: SNF Anticipated d/c is to: SNF Anticipated d/c date is: 2-3 days   Subjective and Interval History:  Per nursing, pt was confused and agitated, wanting to get out of bed.   Objective: Vitals:   05/22/24 0500 05/22/24 0527 05/22/24 0855 05/22/24 1400  BP:  (!) 163/87 (!) 155/75 (!) 188/96  Pulse:  76 79 76  Resp:  20 20   Temp:  98.1 F (36.7 C) 97.6 F (36.4 C)   TempSrc:   Axillary   SpO2:  96% 96%   Weight: 50.7 kg     Height:        Intake/Output Summary (Last 24 hours) at 05/22/2024 1835 Last data filed at 05/22/2024 1010 Gross per 24 hour  Intake 120 ml  Output 600 ml  Net -480 ml   Filed Weights   05/20/24 1940 05/21/24 0400 05/22/24 0500  Weight: 53.1 kg 53.4 kg 50.7 kg    Examination:   Constitutional: NAD, alert, appeared confused, speech difficulty to understand HEENT: conjunctivae and lids normal, EOMI CV: No cyanosis.   RESP: normal  respiratory effort, on RA   Data Reviewed: I have personally reviewed labs and imaging studies  Time spent: 35 minutes  Ellouise Haber, MD [facility] If 7PM-7AM, please contact night-coverage 05/22/2024, 6:35 PM

## 2024-05-22 NOTE — Progress Notes (Signed)
 [facility] CARDIOLOGY PROGRESS NOTE   Patient ID: Leah Holmes MRN: [id number] DOB/AGE: 1926/09/05 89 y.o.  Admit date: 05/20/2024 Referring Physician Dr. Morene Bathe Primary Physician Patient, No Pcp Per  Primary Cardiologist Dr. Dewane Reason for Consultation LAA thrombus on CT  HPI: Leah Holmes is a 89 y.o. female with a past medical history of coronary artery disease with diffuse three-vessel CAD by cath 2012 with critical mid LAD stenosis not amenable to PCI, valvular heart disease, history of atrial fibrillation on warfarin, sick sinus syndrome s/p single-chamber pacer 2017 who presented to the ED on 05/20/2024 for worsening confusion.  CT chest with probably thrombus in left atrial appendage. Cardiology was consulted for further evaluation.   Interval History:  -Patient seen and examined this AM, resting in hospital bed.  -Confused, not sure if she is at baseline.  -Reports joint pain this AM but no CP, SOB, palpitations.   Review of systems complete and found to be negative unless listed above   Vitals:   05/22/24 0010 05/22/24 0500 05/22/24 0527 05/22/24 0855  BP: 126/73  (!) 163/87 (!) 155/75  Pulse: 71  76 79  Resp: 20  20 20   Temp: 98.3 F (36.8 C)  98.1 F (36.7 C) 97.6 F (36.4 C)  TempSrc:    Axillary  SpO2: 93%  96% 96%  Weight:  50.7 kg    Height:         Intake/Output Summary (Last 24 hours) at 05/22/2024 0858 Last data filed at 05/22/2024 0500 Gross per 24 hour  Intake --  Output 850 ml  Net -850 ml     PHYSICAL EXAM General: Chronically ill appearing elderly female, well nourished, in no acute distress. HEENT: Normocephalic and atraumatic. Neck: No JVD.  Lungs: Normal respiratory effort on room air. Clear bilaterally to auscultation. No wheezes, crackles, rhonchi.  Heart: HRRR. Normal S1 and S2 without gallops. + murmur.  Radial & DP pulses 2+ bilaterally. Abdomen: Non-distended appearing.  Msk: Normal strength and  tone for age. Extremities: No clubbing, cyanosis or edema.   Neuro: Alert and oriented X 3. Psych: Mood appropriate, affect congruent.    LABS: Basic Metabolic Panel: Recent Labs    05/20/24 0820 05/21/24 0610  NA 134* 134*  K 4.2 3.9  CL 98 99  CO2 25 24  GLUCOSE 141* 105*  BUN 14 10  CREATININE 0.57 0.47  CALCIUM  9.4 9.5  MG 1.9  --    Liver Function Tests: Recent Labs    05/20/24 0820  AST 21  ALT 13  ALKPHOS 59  BILITOT 0.7  PROT 7.1  ALBUMIN 4.2   No results for input(s): LIPASE, AMYLASE in the last 72 hours. CBC: Recent Labs    05/20/24 0820 05/21/24 0610 05/22/24 0541  WBC 7.8 6.7 9.4  NEUTROABS 5.5  --   --   HGB 13.6 14.6 14.0  HCT 40.6 43.8 41.3  MCV 95.5 97.8 94.3  PLT 244 221 248   Cardiac Enzymes: No results for input(s): CKTOTAL, CKMB, CKMBINDEX, TROPONINIHS in the last 72 hours. BNP: No results for input(s): BNP in the last 72 hours. D-Dimer: No results for input(s): DDIMER in the last 72 hours. Hemoglobin A1C: No results for input(s): HGBA1C in the last 72 hours. Fasting Lipid Panel: No results for input(s): CHOL, HDL, LDLCALC, TRIG, CHOLHDL, LDLDIRECT in the last 72 hours. Thyroid  Function Tests: No results for input(s): TSH, T4TOTAL, T3FREE, THYROIDAB in the last 72 hours.  Invalid input(s): FREET3 Anemia  Panel: No results for input(s): VITAMINB12, FOLATE, FERRITIN, TIBC, IRON, RETICCTPCT in the last 72 hours.  ECHOCARDIOGRAM COMPLETE Result Date: 05/20/2024    ECHOCARDIOGRAM REPORT   Patient Name:   Leah Holmes Date of Exam: 05/20/2024 Medical Rec #:  [id number]               Height:       66.0 in Accession #:    [id number]              Weight:       130.7 lb Date of Birth:  1926-10-05               BSA:          1.669 m Patient Age:    89 years                BP:           176/69 mmHg Patient Gender: F                       HR:           71 bpm. Exam Location:  [facility]  Procedure: 2D Echo, Cardiac Doppler and Color Doppler (Both Spectral and Color            Flow Doppler were utilized during procedure). STAT ECHO Indications:     Thrombus of atrial appendage  History:         Patient has prior history of Echocardiogram examinations, most                  recent 10/26/2022.  Sonographer:     Thedora Louder RDCS, FASE Referring Phys:  [id number] MORENE BATHE Diagnosing Phys: Cara JONETTA Lovelace MD  Sonographer Comments: Technically challenging study due to limited acoustic windows and suboptimal apical window. Image acquisition challenging due to uncooperative patient and Image acquisition challenging due to patient behavioral factors. Very difficult study to complete, despite patient given ativan . IMPRESSIONS  1. Left ventricular ejection fraction, by estimation, is 55 to 60%. The left ventricle has normal function. The left ventricle demonstrates regional wall motion abnormalities (see scoring diagram/findings for description). Left ventricular diastolic parameters are consistent with Grade III diastolic dysfunction (restrictive).  2. Right ventricular systolic function is normal. The right ventricular size is mildly enlarged.  3. Poor windows , no thrombus seen.. Left atrial size was severely dilated.  4. Right atrial size was mild to moderately dilated.  5. 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  6. Tricuspid valve regurgitation is mild to moderate.  7. The aortic valve is calcified. Aortic valve regurgitation is trivial. Severe aortic valve stenosis. FINDINGS  Left Ventricle: Left ventricular ejection fraction, by estimation, is 55 to 60%. The left ventricle has normal function. The left ventricle demonstrates regional wall motion abnormalities. Strain was performed and the global longitudinal strain is indeterminate. The left ventricular internal cavity size was normal in size. There is borderline concentric left ventricular hypertrophy. Left  ventricular diastolic parameters are consistent with Grade III diastolic dysfunction (restrictive). Right Ventricle: The right ventricular size is mildly enlarged. No increase in right ventricular wall thickness. Right ventricular systolic function is normal. Left Atrium: Poor windows , no thrombus seen. Left atrial size was severely dilated. Right Atrium: Right atrial size was mild to moderately dilated. Pericardium: There is no evidence of pericardial effusion. Mitral Valve: The mitral valve is myxomatous. There is moderate thickening of the  mitral valve leaflet(s). There is moderate calcification of the mitral valve leaflet(s). Mild to moderate mitral valve regurgitation. MV peak gradient, 9.6 mmHg. The mean mitral valve gradient is 3.0 mmHg. Tricuspid Valve: The tricuspid valve is normal in structure. Tricuspid valve regurgitation is mild to moderate. Aortic Valve: The aortic valve is calcified. Aortic valve regurgitation is trivial. Severe aortic stenosis is present. Aortic valve mean gradient measures 38.5 mmHg. Aortic valve peak gradient measures 68.2 mmHg. Aortic valve area, by VTI measures 0.29 cm. Pulmonic Valve: The pulmonic valve was not well visualized. Pulmonic valve regurgitation is trivial. Aorta: The ascending aorta was not well visualized. IAS/Shunts: No atrial level shunt detected by color flow Doppler. Additional Comments: 3D was performed not requiring image post processing on an independent workstation and was indeterminate. A device lead is visualized.  LEFT VENTRICLE PLAX 2D LVIDd:         3.70 cm   Diastology LVIDs:         2.60 cm   LV e' medial:    5.22 cm/s LV PW:         1.10 cm   LV E/e' medial:  31.6 LV IVS:        1.20 cm   LV e' lateral:   7.62 cm/s LVOT diam:     1.80 cm   LV E/e' lateral: 21.7 LV SV:         29 LV SV Index:   17 LVOT Area:     2.54 cm  LEFT ATRIUM           Index LA diam:      5.50 cm 3.30 cm/m LA Vol (A4C): 87.0 ml 52.13 ml/m  AORTIC VALVE                      PULMONIC VALVE AV Area (Vmax):    0.33 cm      PV Vmax:          0.76 m/s AV Area (Vmean):   0.31 cm      PV Peak grad:     2.3 mmHg AV Area (VTI):     0.29 cm      PR End Diast Vel: 6.97 msec AV Vmax:           413.00 cm/s   RVOT Peak grad:   2 mmHg AV Vmean:          289.500 cm/s AV VTI:            1.005 m AV Peak Grad:      68.2 mmHg AV Mean Grad:      38.5 mmHg LVOT Vmax:         54.10 cm/s LVOT Vmean:        35.600 cm/s LVOT VTI:          0.114 m LVOT/AV VTI ratio: 0.11  AORTA Ao Root diam: 3.00 cm Ao Asc diam:  2.80 cm MITRAL VALVE                TRICUSPID VALVE MV Area (PHT): 4.21 cm     TR Peak grad:   30.9 mmHg MV Area VTI:   0.96 cm     TR Vmax:        278.00 cm/s MV Peak grad:  9.6 mmHg MV Mean grad:  3.0 mmHg     SHUNTS MV Vmax:       1.55 m/s     Systemic VTI:  0.11 m  MV Vmean:      75.0 cm/s    Systemic Diam: 1.80 cm MV Decel Time: 180 msec MV E velocity: 165.00 cm/s MV A velocity: 68.10 cm/s MV E/A ratio:  2.42 Dwayne D Callwood MD Electronically signed by Cara JONETTA Lovelace MD Signature Date/Time: 05/20/2024/5:13:11 PM    Final    CT CHEST ABDOMEN PELVIS W CONTRAST Result Date: 05/20/2024 EXAM: CT CHEST, ABDOMEN AND PELVIS WITH CONTRAST 05/20/2024 11:50:50 AM TECHNIQUE: CT of the chest, abdomen and pelvis was performed with the administration of intravenous contrast. 100 mL of iohexol  (OMNIPAQUE ) 300 MG/ML solution was administered. Multiplanar reformatted images are provided for review. Automated exposure control, iterative reconstruction, and/or weight based adjustment of the mA/kV was utilized to reduce the radiation dose to as low as reasonably achievable. COMPARISON: 11/01/2022 status post cholecystectomy. CLINICAL HISTORY: ams, fever FINDINGS: CHEST: MEDIASTINUM AND LYMPH NODES: Aortic and mitral valve calcifications are noted. Probable thrombus is noted in the left atrial appendage. Coronary artery calcifications are noted. Pericardium is unremarkable. The central airways are clear. No  mediastinal, hilar or axillary lymphadenopathy. LUNGS AND PLEURA: Small bilateral pleural effusions are noted with minimal adjacent sub-some atelectasis. No focal consolidation or pulmonary edema. No pneumothorax. ABDOMEN AND PELVIS: LIVER: The liver is unremarkable. GALLBLADDER AND BILE DUCTS: Status post cholecystectomy. No biliary ductal dilatation. SPLEEN: No acute abnormality. PANCREAS: No acute abnormality. ADRENAL GLANDS: No acute abnormality. KIDNEYS, URETERS AND BLADDER: No stones in the kidneys or ureters. No hydronephrosis. No perinephric or periureteral stranding. Urinary bladder is unremarkable. GI AND BOWEL: Stomach demonstrates no acute abnormality. There is no bowel obstruction. REPRODUCTIVE ORGANS: No acute abnormality. PERITONEUM AND RETROPERITONEUM: No ascites. No free air. VASCULATURE: Aorta is normal in caliber. Aortic atherosclerosis. ABDOMINAL AND PELVIS LYMPH NODES: No lymphadenopathy. BONES AND SOFT TISSUES: Old L1 and L2 compression fractures are noted. No acute osseous abnormality. No focal soft tissue abnormality. IMPRESSION: 1. Probable thrombus in the left atrial appendage. 2. Small bilateral pleural effusions with minimal adjacent subsegmental atelectasis. Electronically signed by: Lynwood Seip MD 05/20/2024 12:28 PM EST RP Workstation: [id number]   CT HEAD WO CONTRAST ( ) Result Date: 05/20/2024 EXAM: CT HEAD WITHOUT CONTRAST 05/20/2024 10:40:00 AM TECHNIQUE: CT of the head was performed without the administration of intravenous contrast. Automated exposure control, iterative reconstruction, and/or weight based adjustment of the mA/kV was utilized to reduce the radiation dose to as low as reasonably achievable. COMPARISON: CT head without contrast 10/24/2022. CLINICAL HISTORY: Altered mental status. FINDINGS: BRAIN AND VENTRICLES: No acute hemorrhage. A remote lacunar infarct is present in the right lateral nucleus. Moderate atrophy and white matter changes are similar to the  prior exam. Atherosclerotic calcifications are present in the cavernous carotid arteries bilaterally and at the dural margin of both vertebral arteries. No hyperdense vessel is present. No hydrocephalus. No extra-axial collection. No mass effect or midline shift. ORBITS: A left lens replacement is present. SINUSES: No acute abnormality. SOFT TISSUES AND SKULL: No acute soft tissue abnormality. No skull fracture. IMPRESSION: 1. No acute intracranial abnormality. 2. Remote lacunar infarct in the right lateral nucleus. 3. Moderate cerebral atrophy and white matter changes, similar to the prior exam. 4. Atherosclerotic calcifications in the cavernous carotid arteries bilaterally and at the dural margins of both vertebral arteries. Electronically signed by: Lonni Necessary MD 05/20/2024 10:51 AM EST RP Workstation: [id number]     ECHO as above  TELEMETRY (personally reviewed): ventricular pacing rate 90s  EKG (personally reviewed): ventricular pacing rate 72 bpm  DATA reviewed by  me 05/22/24: last 24h vitals tele labs imaging I/O, hospitalist progress note  Principal Problem:   Acute encephalopathy Active Problems:   DM type 2 with diabetic peripheral neuropathy (HCC)   Hyperlipidemia associated with type 2 diabetes mellitus (HCC)   Essential hypertension, benign   Chronic atrial fibrillation (HCC)   Sick sinus syndrome (HCC)    ASSESSMENT AND PLAN: Leah Holmes is a 89 y.o. female with a past medical history of coronary artery disease with diffuse three-vessel CAD by cath 2012 with critical mid LAD stenosis not amenable to PCI, valvular heart disease, history of atrial fibrillation on warfarin, sick sinus syndrome s/p single-chamber pacer 2017 who presented to the ED on 05/20/2024 for worsening confusion.  CT chest with probably thrombus in left atrial appendage. Cardiology was consulted for further evaluation.   # Possible LAA thrombus # Hx atrial fibrillation on warfarin #  Coronary artery disease # SSS s/p PPM 2017 Patient presented with worsening confusion, incidentally found to have possible LAA thrombus on CT chest. Echo this admission with EF 55-60%, +WMAs, grade III diastolic dysfunction, mild to moderate MR and TR, severe AS.  -No further workup of possible LAA thrombus required as we are not pursuing rhythm control strategy for this patient.  -Switch heparin  to low dose eliquis  (appropriate given age and weight).  -Conitnue imdur  60 mg daily and atenolol  25 mg twice daily.  -Further management of AMS per primary team.   Cardiology will sign off. Please haiku with questions or re-engage if needed. Follow up with [name], NP in 1-2 weeks.   This patient's case was discussed and created with Dr. Ammon and he is in agreement.  Signed:  Danita Bloch, PA-C  05/22/2024, 8:58 AM [facility] Cardiology

## 2024-05-22 NOTE — Procedures (Signed)
 Patient Name: Natesha Hassey  MRN: [id number]  Epilepsy Attending: Arlin MALVA Krebs  Referring Physician/Provider: Fernand Prost, MD  Date: 05/22/2024 Duration: 27.54 mins  Patient history: 89yo F with ams. EEG to evaluate for seizure  Level of alertness: Awake  AEDs during EEG study: None  Technical aspects: This EEG study was done with scalp electrodes positioned according to the 10-20 International system of electrode placement. Electrical activity was reviewed with band pass filter of 1-70Hz , sensitivity of 7 uV/mm, display speed of 76mm/sec with a 60Hz  notched filter applied as appropriate. EEG data were recorded continuously and digitally stored.  Video monitoring was available and reviewed as appropriate.  Description: The posterior dominant rhythm consists of 9 Hz activity of moderate voltage (25-35 uV) seen predominantly in posterior head regions, symmetric and reactive to eye opening and eye closing. Hyperventilation and photic stimulation were not performed.     IMPRESSION: This study is within normal limits. No seizures or epileptiform discharges were seen throughout the recording.  A normal interictal EEG does not exclude the diagnosis of epilepsy.   Elihu Milstein O Ammarie Matsuura

## 2024-05-22 NOTE — Progress Notes (Signed)
 PHARMACY - ANTICOAGULATION CONSULT NOTE  Pharmacy Consult for Heparin  infusion  Indication: atrial fibrillation, Probable thrombus in the left atrial appendage   Allergies  Allergen Reactions   Codeine Anaphylaxis   Penicillins Anaphylaxis   Clonidine Derivatives Other (See Comments)    Unknown reaction   Cozaar  [Losartan ] Other (See Comments)    Myalgias    Lipitor [Atorvastatin] Other (See Comments)    Unknown reaction   Ms Contin [Morphine] Other (See Comments)    Unknown reaction   Neurontin  [Gabapentin ] Nausea Only   Reglan [Metoclopramide] Other (See Comments)    Tremors    Valtrex  [Valacyclovir ] Other (See Comments)    Tremors    Aldactone  [Spironolactone ] Itching   Apresoline  [Hydralazine ] Anxiety   Norvasc  [Amlodipine ] Itching    Does cause itching. Pt does take PRN if needed.    Patient Measurements: Height: 5' 6 (167.6 cm) Weight: 50.7 kg (111 lb 12.4 oz) IBW/kg (Calculated) : 59.3 HEPARIN  DW (KG): 53.1  Vital Signs: Temp: 98.1 F (36.7 C) (12/08 0527) BP: 163/87 (12/08 0527) Pulse Rate: 76 (12/08 0527)  Labs: Recent Labs    05/20/24 0820 05/20/24 1347 05/20/24 2146 05/21/24 0610 05/22/24 0541  HGB 13.6  --   --  14.6 14.0  HCT 40.6  --   --  43.8 41.3  PLT 244  --   --  221 248  APTT  --  40*  --   --   --   LABPROT 14.9  --   --  16.7*  --   INR 1.1  --   --  1.3*  --   HEPARINUNFRC  --   --  0.62 0.68 0.64  CREATININE 0.57  --   --  0.47  --     Estimated Creatinine Clearance: 33.7 mL/min (by C-G formula based on SCr of 0.47 mg/dL).   Medical History: Past Medical History:  Diagnosis Date   Atrial fibrillation (HCC) 2012   Breast cancer (HCC) 04/2018   IDC of left breast    CHF (congestive heart failure) (HCC)    Colon polyp 2003   Compression fracture of L2 lumbar vertebra (HCC)    Coronary artery disease    COVID-19 virus infection 12/25/2019   Diabetes mellitus without complication (HCC)    Dyspnea    Dysrhythmia    atrial  fib   Environmental and seasonal allergies    GERD (gastroesophageal reflux disease)    Hyperlipidemia    Hypertension    Hypertensive urgency 03/10/2021   Irritable bowel syndrome    Multiple rib fractures 03/15/2018   Personal history of radiation therapy    Polyp of colon    PONV (postoperative nausea and vomiting)    with ether, not recently woke up in surgery once   Presence of permanent cardiac pacemaker    Sick sinus syndrome (HCC)    Tremor    Tremors of nervous system    Ulcer     Medications:  Warfarin 5mg  daily   Assessment: 89 yo female presenting to ED with AMS. Patient has PMH CAD, sick sinus syndrome S/P pacemaker, A. fib on Coumadin , pulmonary hypertension, HTN, anxiety, and diabetes. CT Chest Abd showing Probable thrombus in the left atrial appendage. INR subtherapeutic on admission. Pharmacy has been consulted for heparin  infusion dosing and monitoring.   Goal of Therapy:  INR 2-3 Heparin  level 0.3-0.7 units/ml Monitor platelets by anticoagulation protocol: Yes   Plan:  12/8:  HL @ 0541 = 0.64, therapeutic X 3  -  will continue pt on current rate and recheck HL on 12/9 with AM labs  Continue to monitor H&H and platelets  Jayshaun Phillips D, PharmD Clinical Pharmacist 05/22/2024 6:36 AM

## 2024-05-22 NOTE — TOC Initial Note (Signed)
 Transition of Care [facility]) - Initial/Assessment Note    Patient Details  Name: Leah Holmes MRN: [id number] Date of Birth: 01-04-1927  Transition of [facility]) CM/SW Contact:    Shasta DELENA Daring, RN Phone Number: 05/22/2024, 1:09 PM  Clinical Narrative:                 RNCM assessed patient. Daughter at bedsie. Patient is lying in bed with eyes open. Did not answer questions or make eye contact. Daughter said she has been very disoriented since admission. Was agitated and was given haldol .  Per daughter, patient is resident of [facility]. Daughter is amenable to rehab services at discharge, but wants patient to be able to stay in her own room (not move to a different hall) Advised we would continue to follow her case and work on discharge planning as appropriate.     Expected Discharge Plan: Skilled Nursing Facility Barriers to Discharge: Continued Medical Work up   Patient Goals and CMS Choice            Expected Discharge Plan and Services In-house Referral: Clinical Social Work Discharge Planning Services: CM Consult                                          Prior Living Arrangements/Services   Lives with:: Facility Resident Patient language and need for interpreter reviewed:: Yes        Need for Family Participation in Patient Care: Yes (Comment) Care giver support system in place?: Yes (comment)   Criminal Activity/Legal Involvement Pertinent to Current Situation/Hospitalization: No - Comment as needed  Activities of Daily Living   ADL Screening (condition at time of admission) Independently performs ADLs?: No Does the patient have a NEW difficulty with bathing/dressing/toileting/self-feeding that is expected to last >3 days?: Yes (Initiates electronic notice to provider for possible OT consult) Does the patient have a NEW difficulty with getting in/out of bed, walking, or climbing stairs that is expected to last >3 days?: Yes (Initiates electronic  notice to provider for possible PT consult) Does the patient have a NEW difficulty with communication that is expected to last >3 days?: Yes (Initiates electronic notice to provider for possible SLP consult) Is the patient deaf or have difficulty hearing?: Yes Does the patient have difficulty seeing, even when wearing glasses/contacts?: No Does the patient have difficulty concentrating, remembering, or making decisions?: Yes  Permission Sought/Granted Permission sought to share information with : Facility Industrial/product Designer granted to share information with : Yes, Verbal Permission Granted  Share Information with NAME: [facility]           Emotional Assessment Appearance:: Appears stated age Attitude/Demeanor/Rapport: Lethargic, Uncooperative Affect (typically observed): Other (comment), Unable to Assess (disoriented, did not answer questions)   Alcohol  / Substance Use: Not Applicable    Admission diagnosis:  Confusion [R41.0] Pleural effusion [J90] Subtherapeutic international normalized ratio (INR) [R79.1] Acute encephalopathy [G93.40] Left atrial thrombus [I51.3] Fever, unspecified fever cause [R50.9] Patient Active Problem List   Diagnosis Date Noted   Acute encephalopathy 05/20/2024   Protein-calorie malnutrition, severe 10/30/2022   Hip fracture (HCC) 10/25/2022   Intractable headache 10/24/2022   Hallucination, visual 10/24/2022   Aortic stenosis 10/24/2022   Acute nonintractable headache 10/23/2022   Hallucination 10/23/2022   Allergic rhinitis 08/11/2022   Hypomagnesemia 06/11/2022   Hospital discharge follow-up 06/11/2022   Generalized weakness 05/21/2022  Rib pain on right side 04/08/2022   Left foot pain 04/08/2022   Counseling regarding end of life decision making 11/01/2021   Glucosuria 09/01/2021   Vitamin D  deficiency 05/27/2021   History of COVID-19 04/24/2021   QT prolongation    Back pain 03/10/2021   Myalgia due to statin 09/15/2020    Acquired thrombophilia 12/25/2019   Pacemaker 09/08/2019   Invasive ductal carcinoma of breast, female, left (HCC) 04/26/2018   Bilateral carotid artery stenosis 04/20/2018   Aortic atherosclerosis 03/15/2018   Balance problem 03/15/2018   History of fall 03/15/2018   Intention tremor 04/15/2017   Osteoporosis 03/21/2016   Sick sinus syndrome (HCC) 01/28/2016   Other malaise 11/01/2015   Constipation 07/27/2015   Chronic atrial fibrillation (HCC) 08/16/2014   Medicare annual wellness visit, subsequent 05/23/2014   Moderate mitral insufficiency 05/08/2014   Moderate tricuspid insufficiency 05/08/2014   Dry mouth 03/10/2014   Low serum vitamin D  02/16/2014   LVH (left ventricular hypertrophy) 11/23/2013   Anal polyp 09/20/2013   History of colonic polyps 08/31/2013   CAD (coronary artery disease) 03/09/2013   DM type 2 with diabetic peripheral neuropathy (HCC) 03/09/2013   Hyperlipidemia associated with type 2 diabetes mellitus (HCC) 03/09/2013   Essential hypertension, benign 03/09/2013   Nonulcer dyspepsia 03/09/2013   PCP:  Patient, No Pcp Per Pharmacy:   [facility] [address] [place] - [address] [place] [address] Phone: [phone] Fax: [phone]     Social Drivers of Health (SDOH) Social History: SDOH Screenings   Food Insecurity: No Food Insecurity (05/20/2024)  Housing: Low Risk  (05/20/2024)  Transportation Needs: No Transportation Needs (05/20/2024)  Utilities: Not At Risk (05/20/2024)  Depression (PHQ2-9): Medium Risk (10/23/2022)  Financial Resource Strain: Low Risk  (09/03/2022)  Physical Activity: Unknown (09/03/2022)  Social Connections: Unknown (09/03/2022)  Stress: No Stress Concern Present (09/03/2022)  Tobacco Use: Medium Risk (05/20/2024)   SDOH Interventions:     Readmission Risk Interventions     No data to display

## 2024-05-22 NOTE — Progress Notes (Signed)
 Pt trying to get out of bed this morning at 0800, heparin  gtt still in place, pt pulling purewick out and saturing the bed with urine before we can get her up to the bsc. 2 assist to [facility], pt not directable. IV Haldol  given per MD.  Haldol  helped for about 2 hours and then pt up and agitated again not directable, IM Zyprexa  given.

## 2024-05-23 LAB — CBC
HCT: 35.6 % — ABNORMAL LOW (ref 36.0–46.0)
Hemoglobin: 12.4 g/dL (ref 12.0–15.0)
MCH: 32.6 pg (ref 26.0–34.0)
MCHC: 34.8 g/dL (ref 30.0–36.0)
MCV: 93.7 fL (ref 80.0–100.0)
Platelets: 217 K/uL (ref 150–400)
RBC: 3.8 MIL/uL — ABNORMAL LOW (ref 3.87–5.11)
RDW: 12.5 % (ref 11.5–15.5)
WBC: 8.1 K/uL (ref 4.0–10.5)
nRBC: 0 % (ref 0.0–0.2)

## 2024-05-23 LAB — GLUCOSE, CAPILLARY: Glucose-Capillary: 160 mg/dL — ABNORMAL HIGH (ref 70–99)

## 2024-05-23 NOTE — Progress Notes (Addendum)
  PROGRESS NOTE    Leah Holmes  FMW:[id number] DOB: 1926-10-04 DOA: 05/20/2024 PCP: Patient, No Pcp Per  251A/251A-AA  LOS: 3 days   Brief hospital course:   Assessment & Plan: Leah Holmes is a 89 y.o. year old female with medical history of hypertension, hyperlipidemia, type 2 diabetes, atrial fibrillation on warfarin, sick sinus syndrome status post pacemaker presenting from her nursing facility for worsening confusion.     Acute encephalopathy --etiology is not known.  No recent medical changes or change in residence.  Per daughter, no hx of dementia.  No neurological deficit.  Can not perform MRI brain due to having pacemaker.  TEE wnl.  Daughter mentioned dx of Lewy body dementia during one of pt's past hospitalizations. --daughter is against physical restraints  --IV haldol , IV ativan  prn --consider neuro consult tomorrow  Possible Left atrial appendage:  --seen on CT c/a/p but not on current Echo.  Cardio consulted --cont Eliquis  --Follow up with [name], NP in 1-2 weeks.   Hx of Afib on warfarin --cont Eliquis    # SSS s/p PPM 2017   Hypertension:  --cont home atenolol , Imdur  and Lisinopril    Type 2 diabetes:  --no need for BG checks   MDD/GAD:  Continue home meds when patient able to tolerate p.o.  Hyponatremia --Na low 130's.  Chronic dCHF  Sepsis ruled out  Underweight, BMI 17.47   DVT prophylaxis: On:Eliquis  Code Status: DNR  Family Communication: daughter updated at bedside today Level of care: Med-Surg Dispo:   The patient is from: SNF LTC Anticipated d/c is to: SNF LTC Anticipated d/c date is: 2-3 days   Subjective and Interval History:  Pt appeared mildly more lucid.   Objective: Vitals:   05/23/24 0801 05/23/24 1130 05/23/24 1528 05/23/24 1935  BP: 117/63 (!) 102/50 117/71 (!) 134/54  Pulse: (!) 59 67 70 65  Resp: 16 15 15 20   Temp: 97.8 F (36.6 C) 97.9 F (36.6 C) 97.8 F (36.6 C) 98.5 F (36.9 C)   TempSrc: Oral   Oral  SpO2: 99% 99% 98% 97%  Weight:      Height:        Intake/Output Summary (Last 24 hours) at 05/23/2024 2113 Last data filed at 05/23/2024 0900 Gross per 24 hour  Intake 120 ml  Output --  Net 120 ml   Filed Weights   05/21/24 0400 05/22/24 0500 05/23/24 0500  Weight: 53.4 kg 50.7 kg 49.1 kg    Examination:   Constitutional: NAD, awake, oriented to person and hospital HEENT: conjunctivae and lids normal, EOMI CV: No cyanosis.   RESP: normal respiratory effort, on RA   Data Reviewed: I have personally reviewed labs and imaging studies  Time spent: 50 minutes  Ellouise Haber, MD [facility] If 7PM-7AM, please contact night-coverage 05/23/2024, 9:13 PM

## 2024-05-23 NOTE — Care Management Important Message (Signed)
 Important Message  Patient Details  Name: Kathlean Cinco MRN: [id number] Date of Birth: [birth year]   Important Message Given:  Yes - Medicare IM     Rojelio SHAUNNA Rattler 05/23/2024, 5:10 PM

## 2024-05-23 NOTE — Progress Notes (Signed)
 Hard to reorient patient. Anxious and confused

## 2024-05-23 NOTE — Plan of Care (Signed)
 No evidence of learning. Patient has a DX of Dementia

## 2024-05-23 NOTE — Progress Notes (Signed)
 Occupational Therapy Treatment Patient Details Name: Leah Holmes MRN: [id number] DOB: 04/09/1927 Today's Date: 05/23/2024   History of present illness Pt is a 89 year old female presenting with acute encephalopathy from her nursing facility, work up includes mild hyponatremia, suspected sepsis,  Abnormal CT with possible left atrial thrombus currently on anticoagulation further evaluation with general require TEE (not currently recommended),     PMH significant for hypertension, hyperlipidemia, type 2 diabetes, atrial fibrillation on warfarin, sick sinus syndrome status post pacemaker   OT comments  Patient seen for OT treatment on this date. Upon arrival to room patient resting in bed, family reports she has been asleep most of the day. To note, lights off and shades closed; OT educated on delerium management and importance of maintaining appropriate circadian rhythm, lights turned on, OT provided wet washcloth and patient able to wash face; more alert. Transitioned to sitting EOB with max A; tolerated for 10 minutes to perform grooming tasks (hair care, washing face,donning socks) sit<>stand x 2 with side steps to [facility] with min A. Patient alert throughout session and family agreeable to continue providing interaction and keeping lights on. Patient ended treatment in bed with bed/chair alarm on and all needs within reach. Patient making good progress toward goals, will continue to follow POC. Discharge recommendation remains appropriate.        If plan is discharge home, recommend the following:  A lot of help with walking and/or transfers;A lot of help with bathing/dressing/bathroom;Supervision due to cognitive status   Equipment Recommendations  Other (comment)    Recommendations for Other Services      Precautions / Restrictions Precautions Precautions: Fall Recall of Precautions/Restrictions: Impaired Restrictions Weight Bearing Restrictions Per Provider Order: No        Mobility Bed Mobility Overal bed mobility: Needs Assistance Bed Mobility: Supine to Sit, Sit to Supine     Supine to sit: Max assist, HOB elevated, Used rails Sit to supine: Max assist, HOB elevated, Used rails   General bed mobility comments: A to lift BLE on/off bed; A for trunk    Transfers Overall transfer level: Needs assistance Equipment used: 1 person hand held assist Transfers: Sit to/from Stand Sit to Stand: Min assist, Mod assist           General transfer comment: lateral steps up to the [facility] with 1 person HH assist, min A for sit<>stand     Balance Overall balance assessment: Needs assistance Sitting-balance support: Feet supported Sitting balance-[name]: Fair   Postural control: Posterior lean Standing balance support: Bilateral upper extremity supported, During functional activity Standing balance-[name]: Poor                             ADL either performed or assessed with clinical judgement   ADL Overall ADL's : Needs assistance/impaired     Grooming: Sitting;Wash/dry face;Minimal assistance               Lower Body Dressing: Maximal assistance;Sit to/from stand;Bed level Lower Body Dressing Details (indicate cue type and reason): A to don socks, A to adjust depends                    Extremity/Trunk Assessment Upper Extremity Assessment Upper Extremity Assessment: Generalized weakness            Vision       Perception     Praxis     Communication Communication Communication:  Impaired Factors Affecting Communication: Hearing impaired   Cognition Arousal: Alert, Lethargic Behavior During Therapy: WFL for tasks assessed/performed Cognition: Cognition impaired   Orientation impairments: Place, Time, Situation Awareness: Intellectual awareness impaired, Online awareness impaired Memory impairment (select all impairments): Short-term memory, Working civil service fast streamer, Engineer, structural memory Attention  impairment (select first level of impairment): Focused attention Executive functioning impairment (select all impairments): Initiation, Sequencing, Organization, Reasoning, Problem solving OT - Cognition Comments: altered from baseline per daugther                 Following commands: Impaired Following commands impaired: Follows one step commands with increased time      Cueing   Cueing Techniques: Verbal cues, Tactile cues, Visual cues  Exercises      Shoulder Instructions       General Comments      Pertinent Vitals/ Pain       Pain Assessment Pain Assessment: No/denies pain  Home Living                                          Prior Functioning/Environment              Frequency  Min 2X/week        Progress Toward Goals  OT Goals(current goals can now be found in the care plan section)  Progress towards OT goals: Progressing toward goals  Acute Rehab OT Goals Patient Stated Goal: none stated OT Goal Formulation: With family Time For Goal Achievement: 06/04/24 Potential to Achieve Goals: Good ADL Goals Pt Will Perform Grooming: with supervision;sitting Pt Will Perform Lower Body Dressing: with supervision;sit to/from stand;sitting/lateral leans Pt Will Transfer to Toilet: with supervision;stand pivot transfer Pt Will Perform Toileting - Clothing Manipulation and hygiene: with supervision;sitting/lateral leans;sit to/from stand  Plan      Co-evaluation                 AM-PAC OT 6 Clicks Daily Activity     Outcome Measure   Help from another person eating meals?: A Little Help from another person taking care of personal grooming?: A Little Help from another person toileting, which includes using toliet, bedpan, or urinal?: A Lot Help from another person bathing (including washing, rinsing, drying)?: A Lot Help from another person to put on and taking off regular upper body clothing?: A Little Help from another person  to put on and taking off regular lower body clothing?: A Lot 6 Click Score: 15    End of Session Equipment Utilized During Treatment: Gait belt  OT Visit Diagnosis: Other abnormalities of gait and mobility (R26.89);Muscle weakness (generalized) (M62.81);Other symptoms and signs involving cognitive function   Activity Tolerance Patient tolerated treatment well   Patient Left in bed;with call bell/phone within reach;with bed alarm set;with family/visitor present   Nurse Communication Mobility status        Time: 8397-8375 OT Time Calculation (min): 22 min  Charges: OT General Charges $OT Visit: 1 Visit OT Treatments $Self Care/Home Management : 8-22 mins  Rogers Clause, OT/L MSOT, 05/23/2024

## 2024-05-23 NOTE — Plan of Care (Signed)
   Problem: Education: Goal: Knowledge of General Education information will improve Description Including pain rating scale, medication(s)/side effects and non-pharmacologic comfort measures Outcome: Progressing   Problem: Health Behavior/Discharge Planning: Goal: Ability to manage health-related needs will improve Outcome: Progressing

## 2024-05-24 ENCOUNTER — Inpatient Hospital Stay

## 2024-05-24 DIAGNOSIS — F419 Anxiety disorder, unspecified: Secondary | ICD-10-CM | POA: Insufficient documentation

## 2024-05-24 DIAGNOSIS — I6782 Cerebral ischemia: Secondary | ICD-10-CM | POA: Diagnosis not present

## 2024-05-24 LAB — SEDIMENTATION RATE: Sed Rate: 35 mm/h — ABNORMAL HIGH (ref 0–30)

## 2024-05-24 LAB — GLUCOSE, CAPILLARY: Glucose-Capillary: 161 mg/dL — ABNORMAL HIGH (ref 70–99)

## 2024-05-24 LAB — VITAMIN B12: Vitamin B-12: 945 pg/mL — ABNORMAL HIGH (ref 180–914)

## 2024-05-24 LAB — TSH: TSH: 0.573 u[IU]/mL (ref 0.350–4.500)

## 2024-05-24 LAB — FOLATE: Folate: >20 ng/mL (ref >5.9)

## 2024-05-24 MED ORDER — LABETALOL HCL 5 MG/ML IV SOLN: 5.0000 mg | INTRAVENOUS | Status: DC | PRN

## 2024-05-24 MED ORDER — SIMETHICONE 80 MG PO CHEW
80.0000 mg | CHEWABLE_TABLET | Freq: Four times a day (QID) | ORAL | Status: DC | PRN
  Administered 2024-05-24 (×2): 80 mg via ORAL
  Filled 2024-05-24 (×2): qty 1

## 2024-05-24 MED ORDER — ALPRAZOLAM 0.25 MG PO TABS
0.2500 mg | ORAL_TABLET | Freq: Every evening | ORAL | Status: DC | PRN
  Administered 2024-05-24 – 2024-05-26 (×2): 0.25 mg via ORAL
  Filled 2024-05-24 (×2): qty 1

## 2024-05-24 MED ORDER — ALPRAZOLAM 0.25 MG PO TABS: 0.2500 mg | ORAL_TABLET | Freq: Every day | ORAL | Status: DC

## 2024-05-24 MED ORDER — QUETIAPINE FUMARATE 25 MG PO TABS
12.5000 mg | ORAL_TABLET | Freq: Every day | ORAL | Status: DC
  Administered 2024-05-24 – 2024-05-26 (×3): 12.5 mg via ORAL
  Filled 2024-05-24 (×3): qty 1

## 2024-05-24 NOTE — Hospital Course (Addendum)
 Ms. Leah Holmes is a 89 year old female with history of hypertension, hyperlipidemia, atrial fibrillation previously on warfarin, sick sinus syndrome status post pacemaker placement, presented from nursing home for chief concerns of worsening confusion.  05/20/2024: Admitted to [facility] for worsening confusion.  Exact etiology was unknown at that time.  Respiratory panel, ammonia level, EEG was ordered.  Chemical restraints was preferred against physical restraints per daughter.  12/7: Heparin  GTT was continued due to left atrial appendage.  Xanax  was held at that time.  PT, OT evaluated patient.  12/8: EEG read as study within normal limits.  No seizures or epileptiform changes were seen throughout the recording.  IV Haldol  and IV Ativan  as needed ordered.  12/9: Heparin  changed to Eliquis  at this time.  12/10: Neurology consulted for encephalopathy of unknown etiology.  Check B12, B1, ESR, folate, TSH per neurology.  Will recheck a CT head without contrast.  As patient cannot get MRI due to pacemaker placed.  12/12: Neurology advised no further intervention.  B12, folate, TSH came back normal, ESR was 35 but no clinical significance, B1 pending Patient appeared to be at baseline.  No signs of further worsening.  No signs of infection.  Patient will be discharged back to skilled nursing facility.

## 2024-05-24 NOTE — Progress Notes (Signed)
 Physical Therapy Treatment Patient Details Name: Leah Holmes MRN: [id number] DOB: 1926/11/09 Today's Date: 05/24/2024   History of Present Illness Pt is a 89 year old female presenting with acute encephalopathy from her nursing facility, work up includes mild hyponatremia, suspected sepsis,  Abnormal CT with possible left atrial thrombus currently on anticoagulation further evaluation with general require TEE (not currently recommended),     PMH significant for hypertension, hyperlipidemia, type 2 diabetes, atrial fibrillation on warfarin, sick sinus syndrome status post pacemaker    PT Comments  Pt pleasant and eager to try and get up. She reported no pain but feeling a little fatigued at beginning of session.  She did well with bed mobility (needing more assist to get back into bed than to get up to sitting) and similarly showed good effort getting up to standing though she did need repeated cuing to insure safety/forward weight shift.  She showed good effort with slow, guarded but relatively safe bout of ambulation (~4ft) with the walker.  Pt reports significant fatigue after ambulation bout but SpO2 remained in the high 90s (room air) and HR was low 60s.  Pt will benefit from ongoing PT to address functional limitations, continue with POC.    If plan is discharge home, recommend the following: Two people to help with walking and/or transfers;A lot of help with bathing/dressing/bathroom;Help with stairs or ramp for entrance;Assist for transportation   Can travel by private vehicle     No  Equipment Recommendations  Other (comment) (TBD at next venue)    Recommendations for Other Services       Precautions / Restrictions Precautions Precautions: Fall Restrictions Weight Bearing Restrictions Per Provider Order: No     Mobility  Bed Mobility Overal bed mobility: Needs Assistance Bed Mobility: Supine to Sit, Sit to Supine     Supine to sit: Contact guard, Min  assist Sit to supine: Mod assist, Max assist   General bed mobility comments: Pt did a great job getting up to sitting EOB, needed much more assist to get LEs back up into bed post ambulation    Transfers Overall transfer level: Needs assistance Equipment used: Rolling walker (2 wheels) Transfers: Sit to/from Stand Sit to Stand: Min assist           General transfer comment: unable to rise on first self-selected attempt, cuing for hand placement, weight shift and needed only minA to attain upright    Ambulation/Gait Ambulation/Gait assistance: Contact guard assist, Min assist Gait Distance (Feet): 40 Feet Assistive device: Rolling walker (2 wheels)         General Gait Details: Pt with no LOBs, but did display intermittent unsteadiness and need consistent tactile feedback to insure safety during ambulation to hall and back.  Pt with short, choppy steps, cues to stay in the walker, poor awareness.   Stairs             Wheelchair Mobility     Tilt Bed    Modified Rankin (Stroke Patients Only)       Balance Overall balance assessment: Needs assistance Sitting-balance support: Feet supported Sitting balance-[name]: Good   Postural control: Posterior lean Standing balance support: Bilateral upper extremity supported Standing balance-[name]: Fair Standing balance comment: once able to get weight over walker she was able to maintain balance relatively well - constant cuing to insure safety 2/2 impulsivity/awareness issues  Communication Communication Communication: Impaired Factors Affecting Communication: Hearing impaired  Cognition Arousal: Alert Behavior During Therapy: WFL for tasks assessed/performed   PT - Cognitive impairments: History of cognitive impairments                       PT - Cognition Comments: confused but very pleasant and motivated t/o session Following commands:  Impaired Following commands impaired: Follows one step commands with increased time    Cueing Cueing Techniques: Verbal cues, Tactile cues, Visual cues  Exercises      General Comments General comments (skin integrity, edema, etc.): Pt was motivated to do some activity, needed almost constant cuing to stay on task and safe      Pertinent Vitals/Pain Pain Assessment Pain Assessment: No/denies pain    Home Living                          Prior Function            PT Goals (current goals can now be found in the care plan section) Progress towards PT goals: Progressing toward goals    Frequency    Min 2X/week      PT Plan      Co-evaluation              AM-PAC PT 6 Clicks Mobility   Outcome Measure  Help needed turning from your back to your side while in a flat bed without using bedrails?: None Help needed moving from lying on your back to sitting on the side of a flat bed without using bedrails?: A Little Help needed moving to and from a bed to a chair (including a wheelchair)?: A Little Help needed standing up from a chair using your arms (e.g., wheelchair or bedside chair)?: A Little Help needed to walk in hospital room?: A Lot Help needed climbing 3-5 steps with a railing? : A Lot 6 Click Score: 17    End of Session Equipment Utilized During Treatment: Gait belt Activity Tolerance: Patient tolerated treatment well;Patient limited by fatigue Patient left: with call bell/phone within reach;with bed alarm set (telesitter) Nurse Communication: Mobility status PT Visit Diagnosis: Unsteadiness on feet (R26.81);Other abnormalities of gait and mobility (R26.89);Muscle weakness (generalized) (M62.81)     Time: 8375-8359 PT Time Calculation (min) (ACUTE ONLY): 16 min  Charges:    $Gait Training: 8-22 mins PT General Charges $$ ACUTE PT VISIT: 1 Visit                     Carmin JONELLE Deed, DPT 05/24/2024, 5:55 PM

## 2024-05-24 NOTE — Progress Notes (Signed)
 PROGRESS NOTE  Leah Holmes  FMW:[id number] DOB: Feb 05, 1927 DOA: 05/20/2024 PCP: Patient, No Pcp Per   Ms. Leah Holmes is a 89 year old female with history of hypertension, hyperlipidemia, atrial fibrillation previously on warfarin, sick sinus syndrome status post pacemaker placement, presented from nursing home for chief concerns of worsening confusion.  05/20/2024: Admitted to [facility] for worsening confusion.  Exact etiology was unknown at that time.  Respiratory panel, ammonia level, EEG was ordered.  Chemical restraints was preferred against physical restraints per daughter.  12/7: Heparin  GTT was continued due to left atrial appendage.  Xanax  was held at that time.  PT, OT evaluated patient.  12/8: EEG read as study within normal limits.  No seizures or epileptiform changes were seen throughout the recording.  IV Haldol  and IV Ativan  as needed ordered.  12/9: Heparin  changed to Eliquis  at this time.  12/10: I assumed care of the patient.  Neurology consulted for encephalopathy of unknown etiology.  Check B12, B1, ESR, folate, TSH per neurology.  Will recheck a CT head without contrast.  As patient cannot get MRI due to pacemaker placed.  Assessment & Plan:   Principal Problem:   Acute encephalopathy Active Problems:   Essential hypertension, benign   Chronic atrial fibrillation (HCC)   Sick sinus syndrome (HCC)   DM type 2 with diabetic peripheral neuropathy (HCC)   Hyperlipidemia associated with type 2 diabetes mellitus (HCC)   Anxiety   Assessment and Plan:  * Acute encephalopathy Neurology was consulted today Patient appears improved and very close to his baseline per family Per neurology, will repeat a CT scan of the head Check B12, B1, folic acid , ESR, TSH per neurology Follow-up with PCP/outpatient neurology as given patient's age, dementia is not outside the realm of possibility  Sick sinus syndrome (HCC) Status post pacemaker  Chronic  atrial fibrillation (HCC) Home apixaban  2.5 mg p.o. twice daily resumed Atenolol  25 mg p.o. twice daily  Essential hypertension, benign Home atenolol  25 mg p.o. twice daily, isosorbide  mononitrate 60 mg daily, lisinopril  40 mg daily resumed Labetalol  5 mg IV every 4 hours as needed for SBP greater 170, 4 doses ordered  Anxiety PDMP reviewed, alprazolam  0.25 mg 14 days, 14 tablets. Resumed  DVT prophylaxis: Eliquis  Code Status: DNR/DNI Family Communication: Updated daughter and son-in-law at bedside Disposition Plan: Pending clinical course Level of care: Med-Surg  Consultants:  Neurology, cardiology  Procedures:  None at this time  Subjective:  At bedside, patient patient was able to tell me her first and last name, age, location, current calendar year, current month.  Patient does not appear to be in acute distress.  Daughter and son-in-law were at bedside.  I discussed that I have consulted neurology for continued encephalopathy per family.  Objective: Vitals:   05/24/24 0442 05/24/24 0731 05/24/24 1157 05/24/24 1521  BP:  (!) 160/80 137/76 (!) 149/64  Pulse:  (!) 59 70 67  Resp:  16 16 17   Temp:  97.7 F (36.5 C) 97.8 F (36.6 C) 97.7 F (36.5 C)  TempSrc:      SpO2:  98% 96% 98%  Weight: 51.3 kg     Height:        Intake/Output Summary (Last 24 hours) at 05/24/2024 1553 Last data filed at 05/24/2024 1500 Gross per 24 hour  Intake --  Output 350 ml  Net -350 ml   Filed Weights   05/22/24 0500 05/23/24 0500 05/24/24 0442  Weight: 50.7 kg 49.1 kg 51.3 kg  Examination:  General exam: Appears frail, age-appropriate, calm and comfortable  Respiratory system: Clear to auscultation. Respiratory effort normal. Cardiovascular system: S1 & S2 heard, RRR. No JVD, murmurs, rubs, gallops or clicks. No pedal edema. Gastrointestinal system: Abdomen is nondistended, soft and nontender. No organomegaly or masses felt. Normal bowel sounds heard. Central nervous system:  Alert and oriented. No focal neurological deficits. Extremities: Generalized weakness Skin: No rashes, lesions or ulcers Psychiatry: Judgement and insight appear normal. Mood & affect appropriate.   Data Reviewed: I have personally reviewed following labs and imaging studies  CBC: Recent Labs  Lab 05/20/24 0820 05/21/24 0610 05/22/24 0541 05/23/24 0406  WBC 7.8 6.7 9.4 8.1  NEUTROABS 5.5  --   --   --   HGB 13.6 14.6 14.0 12.4  HCT 40.6 43.8 41.3 35.6*  MCV 95.5 97.8 94.3 93.7  PLT 244 221 248 217   Basic Metabolic Panel: Recent Labs  Lab 05/20/24 0820 05/21/24 0610  NA 134* 134*  K 4.2 3.9  CL 98 99  CO2 25 24  GLUCOSE 141* 105*  BUN 14 10  CREATININE 0.57 0.47  CALCIUM  9.4 9.5  MG 1.9  --    GFR: Estimated Creatinine Clearance: 34.1 mL/min (by C-G formula based on SCr of 0.47 mg/dL).  Liver Function Tests: Recent Labs  Lab 05/20/24 0820  AST 21  ALT 13  ALKPHOS 59  BILITOT 0.7  PROT 7.1  ALBUMIN 4.2   Recent Labs  Lab 05/20/24 2150  AMMONIA 24   Coagulation Profile: Recent Labs  Lab 05/20/24 0820 05/21/24 0610  INR 1.1 1.3*   CBG: Recent Labs  Lab 05/20/24 1934 05/22/24 0848 05/23/24 0800 05/24/24 0733  GLUCAP 110* 188* 160* 161*   Recent Results (from the past 240 hours)  Urine Culture (for pregnant, neutropenic or urologic patients or patients with an indwelling urinary catheter)     Status: None   Collection Time: 05/20/24  8:40 AM   Specimen: Urine, Random  Result Value Ref Range Status   Specimen Description   Final    URINE, RANDOM Performed at [facility], [address], [place], [place] [address]    Special Requests   Final    NONE Performed at [facility], [address] [place], [place] [address]    Culture   Final    NO GROWTH Performed at [facility], [address], [place], [place] [address]    Report Status 05/21/2024 FINAL  Final  Resp panel by RT-PCR (RSV, Flu A&B, Covid)  Anterior Nasal Swab     Status: None   Collection Time: 05/20/24 10:57 AM   Specimen: Anterior Nasal Swab  Result Value Ref Range Status   SARS Coronavirus 2 by RT PCR NEGATIVE NEGATIVE Final    Comment: (NOTE) SARS-CoV-2 target nucleic acids are NOT DETECTED.  The SARS-CoV-2 RNA is generally detectable in upper respiratory specimens during the acute phase of infection. The lowest concentration of SARS-CoV-2 viral copies this assay can detect is 138 copies/mL. A negative result does not preclude SARS-Cov-2 infection and should not be used as the sole basis for treatment or other patient management decisions. A negative result may occur with  improper specimen collection/handling, submission of specimen other than nasopharyngeal swab, presence of viral mutation(s) within the areas targeted by this assay, and inadequate number of viral copies(<138 copies/mL). A negative result must be combined with clinical observations, patient history, and epidemiological information. The expected result is Negative.  Fact  Sheet for Patients:  bloggercourse.com  Fact Sheet for Healthcare Providers:  seriousbroker.it  This test is no t yet approved or cleared by the [place]  FDA and  has been authorized for detection and/or diagnosis of SARS-CoV-2 by FDA under an Emergency Use Authorization (EUA). This EUA will remain  in effect (meaning this test can be used) for the duration of the COVID-19 declaration under Section 564(b)(1) of the Act, 21 U.S.C.section 360bbb-3(b)(1), unless the authorization is terminated  or revoked sooner.       Influenza A by PCR NEGATIVE NEGATIVE Final   Influenza B by PCR NEGATIVE NEGATIVE Final    Comment: (NOTE) The Xpert Xpress SARS-CoV-2/FLU/RSV plus assay is intended as an aid in the diagnosis of influenza from Nasopharyngeal swab specimens and should not be used as a sole basis for treatment. Nasal washings  and aspirates are unacceptable for Xpert Xpress SARS-CoV-2/FLU/RSV testing.  Fact Sheet for Patients: bloggercourse.com  Fact Sheet for Healthcare Providers: seriousbroker.it  This test is not yet approved or cleared by the [place]  FDA and has been authorized for detection and/or diagnosis of SARS-CoV-2 by FDA under an Emergency Use Authorization (EUA). This EUA will remain in effect (meaning this test can be used) for the duration of the COVID-19 declaration under Section 564(b)(1) of the Act, 21 U.S.C. section 360bbb-3(b)(1), unless the authorization is terminated or revoked.     Resp Syncytial Virus by PCR NEGATIVE NEGATIVE Final    Comment: (NOTE) Fact Sheet for Patients: bloggercourse.com  Fact Sheet for Healthcare Providers: seriousbroker.it  This test is not yet approved or cleared by the [place]  FDA and has been authorized for detection and/or diagnosis of SARS-CoV-2 by FDA under an Emergency Use Authorization (EUA). This EUA will remain in effect (meaning this test can be used) for the duration of the COVID-19 declaration under Section 564(b)(1) of the Act, 21 U.S.C. section 360bbb-3(b)(1), unless the authorization is terminated or revoked.  Performed at [facility], [address] [place], [place] [address]   Respiratory (~20 pathogens) panel by PCR     Status: None   Collection Time: 05/20/24  4:07 PM   Specimen: Nasopharyngeal Swab; Respiratory  Result Value Ref Range Status   Adenovirus NOT DETECTED NOT DETECTED Final   Coronavirus 229E NOT DETECTED NOT DETECTED Final    Comment: (NOTE) The Coronavirus on the Respiratory Panel, DOES NOT test for the novel  Coronavirus (2019 nCoV)    Coronavirus HKU1 NOT DETECTED NOT DETECTED Final   Coronavirus NL63 NOT DETECTED NOT DETECTED Final   Coronavirus OC43 NOT DETECTED NOT DETECTED Final    Metapneumovirus NOT DETECTED NOT DETECTED Final   Rhinovirus / Enterovirus NOT DETECTED NOT DETECTED Final   Influenza A NOT DETECTED NOT DETECTED Final   Influenza B NOT DETECTED NOT DETECTED Final   Parainfluenza Virus 1 NOT DETECTED NOT DETECTED Final   Parainfluenza Virus 2 NOT DETECTED NOT DETECTED Final   Parainfluenza Virus 3 NOT DETECTED NOT DETECTED Final   Parainfluenza Virus 4 NOT DETECTED NOT DETECTED Final   Respiratory Syncytial Virus NOT DETECTED NOT DETECTED Final   Bordetella pertussis NOT DETECTED NOT DETECTED Final   Bordetella Parapertussis NOT DETECTED NOT DETECTED Final   Chlamydophila pneumoniae NOT DETECTED NOT DETECTED Final   Mycoplasma pneumoniae NOT DETECTED NOT DETECTED Final    Comment: Performed at [facility], [address], [place], [place] [address]  MRSA Next Gen by PCR, Nasal     Status: None  Collection Time: 05/20/24  7:49 PM   Specimen: Nasal Mucosa; Nasal Swab  Result Value Ref Range Status   MRSA by PCR Next Gen NOT DETECTED NOT DETECTED Final    Comment: (NOTE) The GeneXpert MRSA Assay (FDA approved for NASAL specimens only), is one component of a comprehensive MRSA colonization surveillance program. It is not intended to diagnose MRSA infection nor to guide or monitor treatment for MRSA infections. Test performance is not FDA approved in patients less than 58 years old. Performed at [facility], [address] [place], [place] [address]   Culture, blood (Routine X 2) w Reflex to ID Panel     Status: None (Preliminary result)   Collection Time: 05/20/24  9:52 PM   Specimen: Right Antecubital; Blood  Result Value Ref Range Status   Specimen Description RIGHT ANTECUBITAL  Final   Special Requests   Final    BOTTLES DRAWN AEROBIC AND ANAEROBIC Blood Culture adequate volume   Culture   Final    NO GROWTH 4 DAYS Performed at [facility], [address], [place], [place] [address]    Report Status PENDING   Incomplete  Culture, blood (Routine X 2) w Reflex to ID Panel     Status: None (Preliminary result)   Collection Time: 05/20/24  9:52 PM   Specimen: BLOOD RIGHT HAND  Result Value Ref Range Status   Specimen Description BLOOD RIGHT HAND  Final   Special Requests   Final    BOTTLES DRAWN AEROBIC ONLY Blood Culture results may not be optimal due to an inadequate volume of blood received in culture bottles   Culture   Final    NO GROWTH 4 DAYS Performed at [facility], [address] [place], [place] [address]    Report Status PENDING  Incomplete    Scheduled Meds:  apixaban   2.5 mg Oral BID   atenolol   25 mg Oral BID   divalproex   125 mg Oral QHS   feeding supplement  237 mL Oral BID BM   isosorbide  mononitrate  60 mg Oral Daily   lisinopril   40 mg Oral Daily   QUEtiapine   12.5 mg Oral QHS    LOS: 4 days   Time spent: 50 minutes  Dr. Sherre [facility] If 7PM-7AM, please contact night-coverage 05/24/2024, 3:53 PM

## 2024-05-24 NOTE — Assessment & Plan Note (Addendum)
 Neurology was consulted today Patient appears improved and very close to his baseline per family Per neurology, will repeat a CT scan of the head Check B12, B1, folic acid , ESR, TSH per neurology Follow-up with PCP/outpatient neurology as given patient's age, dementia is not outside the realm of possibility

## 2024-05-24 NOTE — Consult Note (Signed)
 Reason for Consult:AMS Requesting Physician: Cox  CC: AMS  I have been asked by Dr. Sherre to see this patient in consultation for AMS.  HPI: Leah Holmes is an 89 y.o. female with medical history of tremor, hypertension, hyperlipidemia, type 2 diabetes, atrial fibrillation on warfarin (subtherapeutic INR on presentation), sick sinus syndrome status post pacemaker presenting from her nursing facility for worsening confusion.  Patient unable to provide history.  Per chart, daughter and nursing reported patient had been confused since 12/5. She became confused starting 2 pm and it progressively became worse. At baseline pt uses wheelchair but can use the bathroom by herself. Work up has been fairly unremarkable other than an atrial appendage thrombus seen with CT imaging.  Patient being changed to Eliquis .  INR subtherapeutic at admission.  Patient on presentation was agitated, not following commands and with slurred speech.   From review of chart patient has had a previous admission for hallucinations and another presentation for altered mental status.    Past Medical History:  Diagnosis Date   Atrial fibrillation (HCC) 2012   Breast cancer (HCC) 04/2018   IDC of left breast    CHF (congestive heart failure) (HCC)    Colon polyp 2003   Compression fracture of L2 lumbar vertebra (HCC)    Coronary artery disease    COVID-19 virus infection 12/25/2019   Diabetes mellitus without complication (HCC)    Dyspnea    Dysrhythmia    atrial fib   Environmental and seasonal allergies    GERD (gastroesophageal reflux disease)    Hyperlipidemia    Hypertension    Hypertensive urgency 03/10/2021   Irritable bowel syndrome    Multiple rib fractures 03/15/2018   Personal history of radiation therapy    Polyp of colon    PONV (postoperative nausea and vomiting)    with ether, not recently woke up in surgery once   Presence of permanent cardiac pacemaker    Sick sinus syndrome (HCC)     Tremor    Tremors of nervous system    Ulcer     Past Surgical History:  Procedure Laterality Date   ABDOMINAL ADHESION SURGERY     ABDOMINAL HYSTERECTOMY     APPENDECTOMY     BREAST BIOPSY Right late 80s/early 90s   benign   BREAST BIOPSY Left 03/31/2018   us  bx done with Dr. Dessa, invasive ductal carcinoma   BREAST LUMPECTOMY Left 04/22/2018   Procedure: BREAST LUMPECTOMY;  Surgeon: Dessa Reyes ORN, MD;  Location: [facility] OR[facility];  Service: General;  Laterality: Left;   BREAST LUMPECTOMY Left 04/22/2018   [facility], negative LN, clear margins   BREAST LUMPECTOMY Left 2020   positive, done in office   BREAST SURGERY Right    biopsy    CESAREAN SECTION     x 2   CHOLECYSTECTOMY  03/2012   Dr Wonda   COLONOSCOPY  2003   Dr Bedelia in [place]    fibroid tumor     INTRAMEDULLARY (IM) NAIL INTERTROCHANTERIC Right 10/25/2022   Procedure: INTRAMEDULLARY (IM) NAIL INTERTROCHANTERIC;  Surgeon: Ernie Cough, MD;  Location: [facility];  Service: Orthopedics;  Laterality: Right;   PACEMAKER INSERTION N/A 01/28/2016   Procedure: INSERTION PACEMAKER;  Surgeon: Marsa Dooms, MD;  Location: [facility];  Service: Cardiovascular;  Laterality: N/A;   SENTINEL NODE BIOPSY Left 04/22/2018   Procedure: SENTINEL NODE BIOPSY;  Surgeon: Dessa Reyes ORN, MD;  Location: [facility];  Service: General;  Laterality: Left;   UPPER GI ENDOSCOPY  2003   Dr Bedelia in [place]     Family History  Problem Relation Age of Onset   Breast cancer Sister    Cancer Sister 70       breast   Breast cancer Maternal Aunt 2   Breast cancer Cousin    Breast cancer Other 51    Social History:  reports that she has never smoked. She has quit using smokeless tobacco.  Her smokeless tobacco use included snuff. She reports that she does not drink alcohol  and does not use drugs.  Allergies  Allergen Reactions   Codeine Anaphylaxis   Penicillins Anaphylaxis   Clonidine Derivatives Other (See Comments)    Unknown  reaction   Cozaar  [Losartan ] Other (See Comments)    Myalgias    Lipitor [Atorvastatin] Other (See Comments)    Unknown reaction   Ms Contin [Morphine] Other (See Comments)    Unknown reaction   Neurontin  [Gabapentin ] Nausea Only   Reglan [Metoclopramide] Other (See Comments)    Tremors    Valtrex  [Valacyclovir ] Other (See Comments)    Tremors    Aldactone  [Spironolactone ] Itching   Apresoline  [Hydralazine ] Anxiety   Norvasc  [Amlodipine ] Itching    Does cause itching. Pt does take PRN if needed.    Medications: I have reviewed the patient's current medications. Prior to Admission:  Medications Prior to Admission  Medication Sig Dispense Refill Last Dose/Taking   acetaminophen  (TYLENOL ) 500 MG tablet Take 1,000 mg by mouth every 6 (six) hours as needed for moderate pain, fever or headache.   05/19/2024 at  7:10 PM   ALPRAZolam  (XANAX ) 0.25 MG tablet Take 0.25 mg by mouth at bedtime.   05/19/2024 at  7:10 PM   amLODipine  (NORVASC ) 10 MG tablet Take 1 tablet (10 mg total) by mouth daily. (Patient taking differently: Take 10 mg by mouth daily as needed (sBP > 140 after all medications have been taken).) 30 tablet 0 05/19/2024 at  8:15 AM   atenolol  (TENORMIN ) 25 MG tablet Take 25 mg by mouth 2 (two) times daily.   05/19/2024 at  7:10 PM   cetirizine (ZYRTEC) 10 MG tablet Take 10 mg by mouth daily.   05/19/2024 at  8:15 AM   divalproex  (DEPAKOTE  SPRINKLE) 125 MG capsule Take 125 mg by mouth at bedtime.   05/19/2024 at  7:10 PM   furosemide  (LASIX ) 20 MG tablet Take 20 mg by mouth daily.   05/19/2024 at  8:15 AM   isosorbide  mononitrate (IMDUR ) 60 MG 24 hr tablet Take 60 mg by mouth daily.   05/19/2024 at  8:15 AM   Lidocaine  HCl 4 % PTCH Apply 1 patch topically daily.   05/17/2024   lisinopril  (ZESTRIL ) 40 MG tablet Take 1 tablet (40 mg total) by mouth 2 (two) times daily. (Patient taking differently: Take 40 mg by mouth daily.) 180 tablet 0 05/19/2024 at  8:15 AM   meclizine  (ANTIVERT ) 25 MG tablet  Take 1 tablet by mouth three times daily as needed (Patient taking differently: Take 25 mg by mouth 3 (three) times daily as needed for dizziness.) 40 tablet 5 05/19/2024 at  8:15 AM   metFORMIN (GLUCOPHAGE) 500 MG tablet Take 500 mg by mouth daily.   05/19/2024 at  8:15 AM   Multiple Vitamin (MULTIVITAMIN WITH MINERALS) TABS tablet Take 1 tablet by mouth daily. 30 tablet 0 05/19/2024 at  8:15 AM   nitroGLYCERIN  (NITROSTAT ) 0.4 MG SL tablet Place 0.4 mg under the tongue every 5 (five) minutes as needed for  chest pain.   Unknown   polyethylene glycol (MIRALAX  / GLYCOLAX ) 17 g packet Take 17 g by mouth 2 (two) times daily. 14 each 0 05/19/2024 at  7:10 PM   potassium chloride  (KLOR-CON  M) 10 MEQ tablet Take 10 mEq by mouth daily.   05/19/2024 at  8:15 AM   QUEtiapine  (SEROQUEL ) 25 MG tablet Take 0.5 tablets (12.5 mg total) by mouth at bedtime. 30 tablet 0 05/19/2024 at  7:10 PM   senna-docusate (SENOKOT-S) 8.6-50 MG tablet Take 1 tablet by mouth 2 (two) times daily. 30 tablet 0 05/19/2024 at  7:10 PM   warfarin (COUMADIN ) 5 MG tablet Take 5 mg by mouth daily.   05/19/2024 at  7:10 PM   glucose blood (ONE TOUCH ULTRA TEST) test strip USE 1 STRIP TO CHECK GLUCOSE TWICE DAILY  Dx code: E11.9 200 each 3    Lancets (ONETOUCH DELICA PLUS LANCET33G) MISC Place 2 Devices inside cheek daily. DX Code E11.9. 100 each 2    Scheduled:  apixaban   2.5 mg Oral BID   atenolol   25 mg Oral BID   divalproex   125 mg Oral QHS   feeding supplement  237 mL Oral BID BM   isosorbide  mononitrate  60 mg Oral Daily   lisinopril   40 mg Oral Daily   QUEtiapine   12.5 mg Oral QHS    ROS: History obtained from the patient  General ROS: weakness Psychological ROS: negative for - behavioral disorder, hallucinations, memory difficulties, mood swings or suicidal ideation Ophthalmic ROS: negative for - blurry vision, double vision, eye pain or loss of vision ENT ROS: negative for - epistaxis, nasal discharge, oral lesions, sore throat,  tinnitus or vertigo Allergy  and Immunology ROS: negative for - hives or itchy/watery eyes Hematological and Lymphatic ROS: negative for - bleeding problems, bruising or swollen lymph nodes Endocrine ROS: negative for - galactorrhea, hair pattern changes, polydipsia/polyuria or temperature intolerance Respiratory ROS: negative for - cough, hemoptysis, shortness of breath or wheezing Cardiovascular ROS: negative for - chest pain, dyspnea on exertion, edema or irregular heartbeat Gastrointestinal ROS: negative for - abdominal pain, diarrhea, hematemesis, nausea/vomiting or stool incontinence Genito-Urinary ROS: negative for - dysuria, hematuria, incontinence or urinary frequency/urgency Musculoskeletal ROS: neck pain Neurological ROS: as noted in HPI Dermatological ROS: negative for rash and skin lesion changes   Physical Examination: Blood pressure 137/76, pulse 70, temperature 97.8 F (36.6 C), resp. rate 16, height 5' 6 (1.676 m), weight 51.3 kg, SpO2 96%.  HEENT-  Normocephalic, no lesions, without obvious abnormality.  Normal external eye and conjunctiva.  Normal external ears. Normal external nose, mucus membranes and septum.  Normal pharynx. Cardiovascular- Single S1, S2 Lungs- CTA Abdomen- soft, non-tender; bowel sounds normal; no masses,  no organomegaly Extremities- no edema Musculoskeletal-no joint tenderness, deformity or swelling Skin-warm and dry, no hyperpigmentation, vitiligo, or suspicious lesions  Neurological Examination   Mental Status: Alert.  In bed feeding herself.  Knew her age and where she was.  Was able to tell me quite a bit about her medical history but did not know the month, reported it was 1925 and reported that she lived at home with her daughter. Speech fluent without evidence of aphasia.  Able to follow 3 step commands without difficulty.  Mildly dysarthric Cranial Nerves: II: Visual fields grossly normal III,IV, VI: ptosis not present, extra-ocular  motions intact bilaterally V,VII: smile symmetric, facial light touch sensation normal bilaterally VIII: hearing normal bilaterally XI: bilateral shoulder shrug XII: midline tongue extension Motor: Able  to lift all extremities against gravity with no focal weakness appreciated.   Sensory: Pinprick and light touch intact throughout, bilaterally Deep Tendon Reflexes: Slightly increased in the RUE Plantars: Right: downgoing   Left: downgoing Cerebellar: Tremor with finger to nose testing (right greater than left) and normal heel-to-shin testing Gait: not tested due to safety concerns      Laboratory Studies:   Basic Metabolic Panel: Recent Labs  Lab 05/20/24 0820 05/21/24 0610  NA 134* 134*  K 4.2 3.9  CL 98 99  CO2 25 24  GLUCOSE 141* 105*  BUN 14 10  CREATININE 0.57 0.47  CALCIUM  9.4 9.5  MG 1.9  --     Liver Function Tests: Recent Labs  Lab 05/20/24 0820  AST 21  ALT 13  ALKPHOS 59  BILITOT 0.7  PROT 7.1  ALBUMIN 4.2   No results for input(s): LIPASE, AMYLASE in the last 168 hours. Recent Labs  Lab 05/20/24 2150  AMMONIA 24    CBC: Recent Labs  Lab 05/20/24 0820 05/21/24 0610 05/22/24 0541 05/23/24 0406  WBC 7.8 6.7 9.4 8.1  NEUTROABS 5.5  --   --   --   HGB 13.6 14.6 14.0 12.4  HCT 40.6 43.8 41.3 35.6*  MCV 95.5 97.8 94.3 93.7  PLT 244 221 248 217    Cardiac Enzymes: No results for input(s): CKTOTAL, CKMB, CKMBINDEX, TROPONINI in the last 168 hours.  BNP: Invalid input(s): POCBNP  CBG: Recent Labs  Lab 05/20/24 1934 05/22/24 0848 05/23/24 0800 05/24/24 0733  GLUCAP 110* 188* 160* 161*    Microbiology: Results for orders placed or performed during the hospital encounter of 05/20/24  Urine Culture (for pregnant, neutropenic or urologic patients or patients with an indwelling urinary catheter)     Status: None   Collection Time: 05/20/24  8:40 AM   Specimen: Urine, Random  Result Value Ref Range Status   Specimen  Description   Final    URINE, RANDOM Performed at [facility], [address], [place], [place] [address]    Special Requests   Final    NONE Performed at [facility], [address], [place], [place] [address]    Culture   Final    NO GROWTH Performed at [facility], [address], [place], [place] [address]    Report Status 05/21/2024 FINAL  Final  Resp panel by RT-PCR (RSV, Flu A&B, Covid) Anterior Nasal Swab     Status: None   Collection Time: 05/20/24 10:57 AM   Specimen: Anterior Nasal Swab  Result Value Ref Range Status   SARS Coronavirus 2 by RT PCR NEGATIVE NEGATIVE Final    Comment: (NOTE) SARS-CoV-2 target nucleic acids are NOT DETECTED.  The SARS-CoV-2 RNA is generally detectable in upper respiratory specimens during the acute phase of infection. The lowest concentration of SARS-CoV-2 viral copies this assay can detect is 138 copies/mL. A negative result does not preclude SARS-Cov-2 infection and should not be used as the sole basis for treatment or other patient management decisions. A negative result may occur with  improper specimen collection/handling, submission of specimen other than nasopharyngeal swab, presence of viral mutation(s) within the areas targeted by this assay, and inadequate number of viral copies(<138 copies/mL). A negative result must be combined with clinical observations, patient history, and epidemiological information. The expected result is Negative.  Fact Sheet for Patients:  bloggercourse.com  Fact Sheet for Healthcare Providers:  seriousbroker.it  This test is no t yet  approved or cleared by the [place]  FDA and  has been authorized for detection and/or diagnosis of SARS-CoV-2 by FDA under an Emergency Use Authorization (EUA). This EUA will remain  in effect (meaning this test can be used) for the duration of the COVID-19 declaration under Section  564(b)(1) of the Act, 21 U.S.C.section 360bbb-3(b)(1), unless the authorization is terminated  or revoked sooner.       Influenza A by PCR NEGATIVE NEGATIVE Final   Influenza B by PCR NEGATIVE NEGATIVE Final    Comment: (NOTE) The Xpert Xpress SARS-CoV-2/FLU/RSV plus assay is intended as an aid in the diagnosis of influenza from Nasopharyngeal swab specimens and should not be used as a sole basis for treatment. Nasal washings and aspirates are unacceptable for Xpert Xpress SARS-CoV-2/FLU/RSV testing.  Fact Sheet for Patients: bloggercourse.com  Fact Sheet for Healthcare Providers: seriousbroker.it  This test is not yet approved or cleared by the [place]  FDA and has been authorized for detection and/or diagnosis of SARS-CoV-2 by FDA under an Emergency Use Authorization (EUA). This EUA will remain in effect (meaning this test can be used) for the duration of the COVID-19 declaration under Section 564(b)(1) of the Act, 21 U.S.C. section 360bbb-3(b)(1), unless the authorization is terminated or revoked.     Resp Syncytial Virus by PCR NEGATIVE NEGATIVE Final    Comment: (NOTE) Fact Sheet for Patients: bloggercourse.com  Fact Sheet for Healthcare Providers: seriousbroker.it  This test is not yet approved or cleared by the [place]  FDA and has been authorized for detection and/or diagnosis of SARS-CoV-2 by FDA under an Emergency Use Authorization (EUA). This EUA will remain in effect (meaning this test can be used) for the duration of the COVID-19 declaration under Section 564(b)(1) of the Act, 21 U.S.C. section 360bbb-3(b)(1), unless the authorization is terminated or revoked.  Performed at [facility], [address] [place], [place] [address]   Respiratory (~20 pathogens) panel by PCR     Status: None   Collection Time: 05/20/24  4:07 PM   Specimen:  Nasopharyngeal Swab; Respiratory  Result Value Ref Range Status   Adenovirus NOT DETECTED NOT DETECTED Final   Coronavirus 229E NOT DETECTED NOT DETECTED Final    Comment: (NOTE) The Coronavirus on the Respiratory Panel, DOES NOT test for the novel  Coronavirus (2019 nCoV)    Coronavirus HKU1 NOT DETECTED NOT DETECTED Final   Coronavirus NL63 NOT DETECTED NOT DETECTED Final   Coronavirus OC43 NOT DETECTED NOT DETECTED Final   Metapneumovirus NOT DETECTED NOT DETECTED Final   Rhinovirus / Enterovirus NOT DETECTED NOT DETECTED Final   Influenza A NOT DETECTED NOT DETECTED Final   Influenza B NOT DETECTED NOT DETECTED Final   Parainfluenza Virus 1 NOT DETECTED NOT DETECTED Final   Parainfluenza Virus 2 NOT DETECTED NOT DETECTED Final   Parainfluenza Virus 3 NOT DETECTED NOT DETECTED Final   Parainfluenza Virus 4 NOT DETECTED NOT DETECTED Final   Respiratory Syncytial Virus NOT DETECTED NOT DETECTED Final   Bordetella pertussis NOT DETECTED NOT DETECTED Final   Bordetella Parapertussis NOT DETECTED NOT DETECTED Final   Chlamydophila pneumoniae NOT DETECTED NOT DETECTED Final   Mycoplasma pneumoniae NOT DETECTED NOT DETECTED Final    Comment: Performed at [facility], [address], [place], [place] [address]  MRSA Next Gen by PCR, Nasal     Status: None   Collection Time: 05/20/24  7:49 PM   Specimen: Nasal Mucosa; Nasal Swab  Result Value Ref Range Status  MRSA by PCR Next Gen NOT DETECTED NOT DETECTED Final    Comment: (NOTE) The GeneXpert MRSA Assay (FDA approved for NASAL specimens only), is one component of a comprehensive MRSA colonization surveillance program. It is not intended to diagnose MRSA infection nor to guide or monitor treatment for MRSA infections. Test performance is not FDA approved in patients less than 25 years old. Performed at [facility], [address] [place], [place] [address]   Culture, blood (Routine X 2) w Reflex to ID Panel      Status: None (Preliminary result)   Collection Time: 05/20/24  9:52 PM   Specimen: Right Antecubital; Blood  Result Value Ref Range Status   Specimen Description RIGHT ANTECUBITAL  Final   Special Requests   Final    BOTTLES DRAWN AEROBIC AND ANAEROBIC Blood Culture adequate volume   Culture   Final    NO GROWTH 4 DAYS Performed at [facility], [address], [place], [place] [address]    Report Status PENDING  Incomplete  Culture, blood (Routine X 2) w Reflex to ID Panel     Status: None (Preliminary result)   Collection Time: 05/20/24  9:52 PM   Specimen: BLOOD RIGHT HAND  Result Value Ref Range Status   Specimen Description BLOOD RIGHT HAND  Final   Special Requests   Final    BOTTLES DRAWN AEROBIC ONLY Blood Culture results may not be optimal due to an inadequate volume of blood received in culture bottles   Culture   Final    NO GROWTH 4 DAYS Performed at [facility], [address] [place], [place] [address]    Report Status PENDING  Incomplete    Coagulation Studies: No results for input(s): LABPROT, INR in the last 72 hours.  Urinalysis:  Recent Labs  Lab 05/20/24 0840  COLORURINE YELLOW*  LABSPEC 1.015  PHURINE 6.0  GLUCOSEU NEGATIVE  HGBUR NEGATIVE  BILIRUBINUR NEGATIVE  KETONESUR NEGATIVE  PROTEINUR NEGATIVE  NITRITE NEGATIVE  LEUKOCYTESUR TRACE*    Lipid Panel:     Component Value Date/Time   CHOL 133 10/29/2022 0455   TRIG 59 10/29/2022 0455   HDL 52 10/29/2022 0455   CHOLHDL 2.6 10/29/2022 0455   VLDL 12 10/29/2022 0455   LDLCALC 69 10/29/2022 0455    HgbA1C:  Lab Results  Component Value Date   HGBA1C 6.9 (H) 09/07/2022    Urine Drug Screen:  No results found for: LABOPIA, COCAINSCRNUR, LABBENZ, AMPHETMU, THCU, LABBARB  Alcohol  Level: No results for input(s): ETH in the last 168 hours.  Other results: EKG: NSR at 79 bpm.  Imaging: EEG adult Result Date: 05/22/2024 Shelton Arlin KIDD, MD      05/22/2024  2:14 PM Patient Name: Chrissi Crow MRN: [id number] Epilepsy Attending: Arlin KIDD Shelton Referring Physician/Provider: Fernand Prost, MD Date: 05/22/2024 Duration: 27.54 mins Patient history: 89yo F with ams. EEG to evaluate for seizure Level of alertness: Awake AEDs during EEG study: None Technical aspects: This EEG study was done with scalp electrodes positioned according to the 10-20 International system of electrode placement. Electrical activity was reviewed with band pass filter of 1-70Hz , sensitivity of 7 uV/mm, display speed of 37mm/sec with a 60Hz  notched filter applied as appropriate. EEG data were recorded continuously and digitally stored.  Video monitoring was available and reviewed as appropriate. Description: The posterior dominant rhythm consists of 9 Hz activity of moderate voltage (25-35 uV) seen predominantly in posterior head regions, symmetric and reactive to eye  opening and eye closing. Hyperventilation and photic stimulation were not performed.   IMPRESSION: This study is within normal limits. No seizures or epileptiform discharges were seen throughout the recording. A normal interictal EEG does not exclude the diagnosis of epilepsy. Arlin MALVA Krebs     Assessment/Plan: 89 y.o. female with medical history of tremor, hypertension, hyperlipidemia, type 2 diabetes, atrial fibrillation on warfarin (subtherapeutic INR on presentation), sick sinus syndrome status post pacemaker presenting from her nursing facility for worsening confusion.  Patient unable to provide history.  Per chart, daughter and nursing reported patient had been confused since 12/5. She became confused starting 2 pm and it progressively became worse. At baseline pt uses wheelchair but can use the bathroom by herself. Work up has been fairly unremarkable other than an atrial appendage thrombus seen with CT imaging.  Patient being changed to Eliquis .  INR subtherapeutic at admission.  Patient on presentation  was agitated, not following commands and with slurred speech.  Seems improved from this currently. From review of chart patient has had a previous admission for hallucinations and another presentation for altered mental status. At her age the idea that she may be developing a dementia is not out of the range of what is possible.  Unclear why patient is on Depakote  and Seroquel .  The possibility of an acute infarct wit her atrial fibrillation and possible atrial appendage thrombus is considerable as well.  Initial head CT reviewed and shows no acute changes. Patient unable to have MRI due to pacemaker but being adequately anticoagulated.    Recommendations: Repeat head CT without contrast to evaluate for possible evolution of an infarct not seen on initial imaging Agree with anticoagulation B12, B1, folate, ESR, TSH If no emergent etiology noted during hospitalization, work up may be continued on an outpatient basis   Case discussed with Dr. Sherre Sonny Hock, MD Neurology  05/24/2024, 2:01 PM

## 2024-05-24 NOTE — Plan of Care (Signed)
?  Problem: Safety: ?Goal: Non-violent Restraint(s) ?Outcome: Progressing ?  ?Problem: Education: ?Goal: Knowledge of General Education information will improve ?Description: Including pain rating scale, medication(s)/side effects and non-pharmacologic comfort measures ?Outcome: Progressing ?  ?Problem: Health Behavior/Discharge Planning: ?Goal: Ability to manage health-related needs will improve ?Outcome: Progressing ?  ?Problem: Clinical Measurements: ?Goal: Ability to maintain clinical measurements within normal limits will improve ?Outcome: Progressing ?  ?

## 2024-05-24 NOTE — Assessment & Plan Note (Signed)
 PDMP reviewed, alprazolam  0.25 mg 14 days, 14 tablets. Resumed

## 2024-05-24 NOTE — Assessment & Plan Note (Addendum)
 Home atenolol  25 mg p.o. twice daily, isosorbide  mononitrate 60 mg daily, lisinopril  40 mg daily resumed Labetalol  5 mg IV every 4 hours as needed for SBP greater 170, 4 doses ordered

## 2024-05-24 NOTE — Assessment & Plan Note (Signed)
 Status post pacemaker

## 2024-05-24 NOTE — Assessment & Plan Note (Signed)
 Home apixaban  2.5 mg p.o. twice daily resumed Atenolol  25 mg p.o. twice daily

## 2024-05-25 ENCOUNTER — Other Ambulatory Visit (HOSPITAL_COMMUNITY): Payer: Self-pay

## 2024-05-25 LAB — CULTURE, BLOOD (ROUTINE X 2)
Culture: NO GROWTH
Culture: NO GROWTH
Special Requests: ADEQUATE

## 2024-05-25 LAB — GLUCOSE, CAPILLARY
Glucose-Capillary: 160 mg/dL — ABNORMAL HIGH (ref 70–99)
Glucose-Capillary: 193 mg/dL — ABNORMAL HIGH (ref 70–99)

## 2024-05-25 NOTE — Progress Notes (Signed)
 Occupational Therapy Treatment Patient Details Name: Leah Holmes MRN: [id number] DOB: 1927/05/09 Today's Date: 05/25/2024   History of present illness Pt is a 89 year old female presenting with acute encephalopathy from her nursing facility, work up includes mild hyponatremia, suspected sepsis,  Abnormal CT with possible left atrial thrombus currently on anticoagulation further evaluation with general require TEE (not currently recommended),     PMH significant for hypertension, hyperlipidemia, type 2 diabetes, atrial fibrillation on warfarin, sick sinus syndrome status post pacemaker   OT comments  Patient seen for OT treatment on this date. Upon arrival to room patient resting in bed but anxious about transferring rooms, she required redirection and was minimally engaged throughout treatment due to increased anxiety. Performed bed mobility with min A, ambulated about 20 feet but entire time self limiting with stating I can't do this, I'm too weak however only required CGA for in room ambulation with r/w.  Patient ended treatment in bed with bed/chair alarm on and all needs within reach. Patient making good progress toward goals, will continue to follow POC. Discharge recommendation remains appropriate.        If plan is discharge home, recommend the following:  A lot of help with walking and/or transfers;A lot of help with bathing/dressing/bathroom;Supervision due to cognitive status   Equipment Recommendations  Other (comment)    Recommendations for Other Services      Precautions / Restrictions Precautions Precautions: Fall Recall of Precautions/Restrictions: Impaired Restrictions Weight Bearing Restrictions Per Provider Order: No       Mobility Bed Mobility Overal bed mobility: Needs Assistance Bed Mobility: Supine to Sit, Sit to Supine     Supine to sit: Contact guard, Min assist Sit to supine: Mod assist   General bed mobility comments: able to get to EOB  with min A to move RLE, A to lift BLE back into bed    Transfers Overall transfer level: Needs assistance Equipment used: Rolling walker (2 wheels) Transfers: Sit to/from Stand Sit to Stand: Min assist           General transfer comment: cues for hand placement and encouragement to use walker     Balance Overall balance assessment: Needs assistance Sitting-balance support: Feet supported Sitting balance-[name]: Good   Postural control: Posterior lean Standing balance support: Bilateral upper extremity supported Standing balance-[name]: Fair                             ADL either performed or assessed with clinical judgement   ADL Overall ADL's : Needs assistance/impaired                     Lower Body Dressing: Maximal assistance;Sit to/from stand Lower Body Dressing Details (indicate cue type and reason): A to don socks, A to adjust depends               General ADL Comments: unable to reach B distal LE for LB self care tasks; steadying A while in stance    Extremity/Trunk Assessment Upper Extremity Assessment Upper Extremity Assessment: Generalized weakness   Lower Extremity Assessment Lower Extremity Assessment: Defer to PT evaluation        Vision       Perception     Praxis     Communication Communication Communication: Impaired Factors Affecting Communication: Hearing impaired   Cognition Arousal: Alert Behavior During Therapy: Anxious Cognition: Cognition impaired   Orientation impairments: Place, Time,  Situation Awareness: Intellectual awareness impaired, Online awareness impaired   Attention impairment (select first level of impairment): Focused attention                     Following commands: Impaired Following commands impaired: Follows one step commands inconsistently      Cueing   Cueing Techniques: Verbal cues, Tactile cues, Visual cues  Exercises      Shoulder Instructions        General Comments      Pertinent Vitals/ Pain       Pain Assessment Pain Assessment: No/denies pain  Home Living                                          Prior Functioning/Environment              Frequency  Min 2X/week        Progress Toward Goals  OT Goals(current goals can now be found in the care plan section)  Progress towards OT goals: Progressing toward goals  Acute Rehab OT Goals Patient Stated Goal: to get stronger OT Goal Formulation: With patient Time For Goal Achievement: 06/04/24 Potential to Achieve Goals: Good ADL Goals Pt Will Perform Grooming: with supervision;sitting Pt Will Perform Lower Body Dressing: with supervision;sit to/from stand;sitting/lateral leans Pt Will Transfer to Toilet: with supervision;stand pivot transfer Pt Will Perform Toileting - Clothing Manipulation and hygiene: with supervision;sitting/lateral leans;sit to/from stand  Plan      Co-evaluation                 AM-PAC OT 6 Clicks Daily Activity     Outcome Measure   Help from another person eating meals?: A Little Help from another person taking care of personal grooming?: A Little Help from another person toileting, which includes using toliet, bedpan, or urinal?: A Lot Help from another person bathing (including washing, rinsing, drying)?: A Lot Help from another person to put on and taking off regular upper body clothing?: A Little Help from another person to put on and taking off regular lower body clothing?: A Lot 6 Click Score: 15    End of Session Equipment Utilized During Treatment: Gait belt  OT Visit Diagnosis: Other abnormalities of gait and mobility (R26.89);Muscle weakness (generalized) (M62.81);Other symptoms and signs involving cognitive function   Activity Tolerance Patient tolerated treatment well   Patient Left in bed;with call bell/phone within reach;with bed alarm set;with family/visitor present   Nurse Communication  Mobility status        Time: 8455-8441 OT Time Calculation (min): 14 min  Charges: OT General Charges $OT Visit: 1 Visit OT Treatments $Self Care/Home Management : 8-22 mins  Rogers Clause, OT/L MSOT, 05/25/2024

## 2024-05-25 NOTE — Progress Notes (Addendum)
°  Progress Note   Patient: Leah Holmes FMW:[id number] DOB: [birth year] DOA: 05/20/2024     5 DOS: the patient was seen and examined on 05/25/2024   Brief hospital course: Leah Holmes is a 89 year old female with history of hypertension, hyperlipidemia, atrial fibrillation previously on warfarin, sick sinus syndrome status post pacemaker placement, presented from nursing home for chief concerns of worsening confusion.  05/20/2024: Admitted to [facility] hospitalist service for worsening confusion.  Exact etiology was unknown at that time.  Respiratory panel, ammonia level, EEG was ordered.  Chemical restraints was preferred against physical restraints per daughter.  12/7: Heparin  GTT was continued due to left atrial appendage.  Xanax  was held at that time.  PT, OT evaluated patient.  12/8: EEG read as study within normal limits.  No seizures or epileptiform changes were seen throughout the recording.  IV Haldol  and IV Ativan  as needed ordered.  12/9: Heparin  changed to Eliquis  at this time.  12/10: Neurology consulted for encephalopathy of unknown etiology.  Check B12, B1, ESR, folate, TSH per neurology.  Will recheck a CT head without contrast.  As patient cannot get MRI due to pacemaker placed.    Assessment and Plan:  Acute encephalopathy Neurology was consulted today Patient appears improved and very close to his baseline per family Per neurology, will repeat a CT scan of the head TSH, folate, B12 are unremarkable, ESR mildly elevated, B1 is pending Follow-up with PCP/outpatient neurology as given patient's age, dementia is not outside the realm of possibility  Sick sinus syndrome (HCC) Status post pacemaker  Chronic atrial fibrillation (HCC) Home apixaban  2.5 mg p.o. twice daily resumed Atenolol  25 mg p.o. twice daily  Essential hypertension, benign Home atenolol  25 mg p.o. twice daily, isosorbide  mononitrate 60 mg daily, lisinopril  40 mg daily resumed Labetalol  5  mg IV every 4 hours as needed for SBP greater 170, 4 doses ordered  Anxiety PDMP reviewed, alprazolam  0.25 mg 14 days, 14 tablets. Resumed       Subjective: Alert awake and confused  Physical Exam: Vitals:   05/25/24 0453 05/25/24 0511 05/25/24 0819 05/25/24 1159  BP:  (!) 169/103 (!) 162/84 (!) 166/91  Pulse:  60 60 60  Resp:  18 16 16   Temp:  97.8 F (36.6 C) (!) 97.4 F (36.3 C) (!) 97.4 F (36.3 C)  TempSrc:  Oral    SpO2:  98% 97% 98%  Weight: 52.9 kg     Height:       Constitutional: Alert, awake,  confused HEENT: Neck supple Respiratory: Clear to auscultation B/L, no wheezing, no rales.  Cardiovascular: Regular rate and rhythm, no murmurs / rubs / gallops. No extremity edema. 2+ pedal pulses. No carotid bruits.  Abdomen: Soft, no tenderness, Bowel sounds positive.  Musculoskeletal: no clubbing / cyanosis. Good ROM, no contractures. Normal muscle tone.  Skin: no rashes, lesions, ulcers. Neurologic: CN 2-12 grossly intact. Sensation intact, No focal deficit identified    Data Reviewed:  Reviewed  Family Communication: None  Disposition: Status is: Inpatient Remains inpatient appropriate because: Ongoing recovery from acute metabolic encephalopathy  Planned Discharge Destination: Skilled nursing facility    Time spent: 25 minutes  Author: Nena Rebel, MD 05/25/2024 4:33 PM  For on call review [email or web address].

## 2024-05-25 NOTE — Progress Notes (Signed)
 Subjective: Patient awake and alert.  Appropriate in conversation.  Complains of pain in feet, back, hip, right shoulder, neck.  Had an uneventful night overnight-not as agitated.  Objective: Current vital signs: BP (!) 162/84 (BP Location: Left Arm)   Pulse 60   Temp (!) 97.4 F (36.3 C)   Resp 16   Ht 5' 6 (1.676 m)   Wt 52.9 kg   SpO2 97%   BMI 18.82 kg/m  Vital signs in last 24 hours: Temp:  [97.4 F (36.3 C)-98.4 F (36.9 C)] 97.4 F (36.3 C) (12/11 0819) Pulse Rate:  [60-70] 60 (12/11 0819) Resp:  [16-20] 16 (12/11 0819) BP: (137-180)/(64-103) 162/84 (12/11 0819) SpO2:  [95 %-99 %] 97 % (12/11 0819) Weight:  [52.9 kg] 52.9 kg (12/11 0453)  Intake/Output from previous day: 12/10 0701 - 12/11 0700 In: 480 [P.O.:480] Out: 650 [Urine:650] Intake/Output this shift: No intake/output data recorded. Nutritional status:  Diet Order             DIET DYS 2 Room service appropriate? Yes; Fluid consistency: Thin  Diet effective now                   Neurologic Exam: Mental Status: Alert. Knew her age and where she was.  Speech fluent without evidence of aphasia.  Able to follow 3 step commands without difficulty.  Mildly dysarthric Cranial Nerves: II: Visual fields grossly normal III,IV, VI: ptosis not present, extra-ocular motions intact bilaterally V,VII: smile symmetric, facial light touch sensation normal bilaterally VIII: hearing normal bilaterally XI: bilateral shoulder shrug XII: midline tongue extension Motor: Able to lift all extremities against gravity with no focal weakness appreciated.   Sensory: Pinprick and light touch intact throughout, bilaterally Cerebellar: Tremor improved but present Gait: not tested due to safety concerns  Lab Results: Basic Metabolic Panel: Recent Labs  Lab 05/20/24 0820 05/21/24 0610  NA 134* 134*  K 4.2 3.9  CL 98 99  CO2 25 24  GLUCOSE 141* 105*  BUN 14 10  CREATININE 0.57 0.47  CALCIUM  9.4 9.5  MG 1.9  --      Liver Function Tests: Recent Labs  Lab 05/20/24 0820  AST 21  ALT 13  ALKPHOS 59  BILITOT 0.7  PROT 7.1  ALBUMIN 4.2   No results for input(s): LIPASE, AMYLASE in the last 168 hours. Recent Labs  Lab 05/20/24 2150  AMMONIA 24    CBC: Recent Labs  Lab 05/20/24 0820 05/21/24 0610 05/22/24 0541 05/23/24 0406  WBC 7.8 6.7 9.4 8.1  NEUTROABS 5.5  --   --   --   HGB 13.6 14.6 14.0 12.4  HCT 40.6 43.8 41.3 35.6*  MCV 95.5 97.8 94.3 93.7  PLT 244 221 248 217    Cardiac Enzymes: No results for input(s): CKTOTAL, CKMB, CKMBINDEX, TROPONINI in the last 168 hours.  Lipid Panel: No results for input(s): CHOL, TRIG, HDL, CHOLHDL, VLDL, LDLCALC in the last 168 hours.  CBG: Recent Labs  Lab 05/20/24 1934 05/22/24 0848 05/23/24 0800 05/24/24 0733 05/25/24 0815  GLUCAP 110* 188* 160* 161* 160*    Microbiology: Results for orders placed or performed during the hospital encounter of 05/20/24  Urine Culture (for pregnant, neutropenic or urologic patients or patients with an indwelling urinary catheter)     Status: None   Collection Time: 05/20/24  8:40 AM   Specimen: Urine, Random  Result Value Ref Range Status   Specimen Description   Final    URINE, RANDOM  Performed at [facility], [address] [place], [place] [address]    Special Requests   Final    NONE Performed at [facility], [address], [place], [place] [address]    Culture   Final    NO GROWTH Performed at [facility], [address] [place], [place] [address]    Report Status 05/21/2024 FINAL  Final  Resp panel by RT-PCR (RSV, Flu A&B, Covid) Anterior Nasal Swab     Status: None   Collection Time: 05/20/24 10:57 AM   Specimen: Anterior Nasal Swab  Result Value Ref Range Status   SARS Coronavirus 2 by RT PCR NEGATIVE NEGATIVE Final    Comment: (NOTE) SARS-CoV-2 target nucleic acids are NOT DETECTED.  The SARS-CoV-2 RNA is generally  detectable in upper respiratory specimens during the acute phase of infection. The lowest concentration of SARS-CoV-2 viral copies this assay can detect is 138 copies/mL. A negative result does not preclude SARS-Cov-2 infection and should not be used as the sole basis for treatment or other patient management decisions. A negative result may occur with  improper specimen collection/handling, submission of specimen other than nasopharyngeal swab, presence of viral mutation(s) within the areas targeted by this assay, and inadequate number of viral copies(<138 copies/mL). A negative result must be combined with clinical observations, patient history, and epidemiological information. The expected result is Negative.  Fact Sheet for Patients:  bloggercourse.com  Fact Sheet for Healthcare Providers:  seriousbroker.it  This test is no t yet approved or cleared by the [place]  FDA and  has been authorized for detection and/or diagnosis of SARS-CoV-2 by FDA under an Emergency Use Authorization (EUA). This EUA will remain  in effect (meaning this test can be used) for the duration of the COVID-19 declaration under Section 564(b)(1) of the Act, 21 U.S.C.section 360bbb-3(b)(1), unless the authorization is terminated  or revoked sooner.       Influenza A by PCR NEGATIVE NEGATIVE Final   Influenza B by PCR NEGATIVE NEGATIVE Final    Comment: (NOTE) The Xpert Xpress SARS-CoV-2/FLU/RSV plus assay is intended as an aid in the diagnosis of influenza from Nasopharyngeal swab specimens and should not be used as a sole basis for treatment. Nasal washings and aspirates are unacceptable for Xpert Xpress SARS-CoV-2/FLU/RSV testing.  Fact Sheet for Patients: bloggercourse.com  Fact Sheet for Healthcare Providers: seriousbroker.it  This test is not yet approved or cleared by the [place]  FDA  and has been authorized for detection and/or diagnosis of SARS-CoV-2 by FDA under an Emergency Use Authorization (EUA). This EUA will remain in effect (meaning this test can be used) for the duration of the COVID-19 declaration under Section 564(b)(1) of the Act, 21 U.S.C. section 360bbb-3(b)(1), unless the authorization is terminated or revoked.     Resp Syncytial Virus by PCR NEGATIVE NEGATIVE Final    Comment: (NOTE) Fact Sheet for Patients: bloggercourse.com  Fact Sheet for Healthcare Providers: seriousbroker.it  This test is not yet approved or cleared by the [place]  FDA and has been authorized for detection and/or diagnosis of SARS-CoV-2 by FDA under an Emergency Use Authorization (EUA). This EUA will remain in effect (meaning this test can be used) for the duration of the COVID-19 declaration under Section 564(b)(1) of the Act, 21 U.S.C. section 360bbb-3(b)(1), unless the authorization is terminated or revoked.  Performed at [facility], [address], [place], [place] [address]   Respiratory (~20 pathogens) panel by PCR  Status: None   Collection Time: 05/20/24  4:07 PM   Specimen: Nasopharyngeal Swab; Respiratory  Result Value Ref Range Status   Adenovirus NOT DETECTED NOT DETECTED Final   Coronavirus 229E NOT DETECTED NOT DETECTED Final    Comment: (NOTE) The Coronavirus on the Respiratory Panel, DOES NOT test for the novel  Coronavirus (2019 nCoV)    Coronavirus HKU1 NOT DETECTED NOT DETECTED Final   Coronavirus NL63 NOT DETECTED NOT DETECTED Final   Coronavirus OC43 NOT DETECTED NOT DETECTED Final   Metapneumovirus NOT DETECTED NOT DETECTED Final   Rhinovirus / Enterovirus NOT DETECTED NOT DETECTED Final   Influenza A NOT DETECTED NOT DETECTED Final   Influenza B NOT DETECTED NOT DETECTED Final   Parainfluenza Virus 1 NOT DETECTED NOT DETECTED Final   Parainfluenza Virus 2 NOT DETECTED NOT  DETECTED Final   Parainfluenza Virus 3 NOT DETECTED NOT DETECTED Final   Parainfluenza Virus 4 NOT DETECTED NOT DETECTED Final   Respiratory Syncytial Virus NOT DETECTED NOT DETECTED Final   Bordetella pertussis NOT DETECTED NOT DETECTED Final   Bordetella Parapertussis NOT DETECTED NOT DETECTED Final   Chlamydophila pneumoniae NOT DETECTED NOT DETECTED Final   Mycoplasma pneumoniae NOT DETECTED NOT DETECTED Final    Comment: Performed at [facility], [address] [place], [place] [address]  MRSA Next Gen by PCR, Nasal     Status: None   Collection Time: 05/20/24  7:49 PM   Specimen: Nasal Mucosa; Nasal Swab  Result Value Ref Range Status   MRSA by PCR Next Gen NOT DETECTED NOT DETECTED Final    Comment: (NOTE) The GeneXpert MRSA Assay (FDA approved for NASAL specimens only), is one component of a comprehensive MRSA colonization surveillance program. It is not intended to diagnose MRSA infection nor to guide or monitor treatment for MRSA infections. Test performance is not FDA approved in patients less than 31 years old. Performed at [facility], [address] [place], [place] [address]   Culture, blood (Routine X 2) w Reflex to ID Panel     Status: None   Collection Time: 05/20/24  9:52 PM   Specimen: Right Antecubital; Blood  Result Value Ref Range Status   Specimen Description RIGHT ANTECUBITAL  Final   Special Requests   Final    BOTTLES DRAWN AEROBIC AND ANAEROBIC Blood Culture adequate volume   Culture   Final    NO GROWTH 5 DAYS Performed at [facility], [address] [place], [place] [address]    Report Status 05/25/2024 FINAL  Final  Culture, blood (Routine X 2) w Reflex to ID Panel     Status: None   Collection Time: 05/20/24  9:52 PM   Specimen: BLOOD RIGHT HAND  Result Value Ref Range Status   Specimen Description BLOOD RIGHT HAND  Final   Special Requests   Final    BOTTLES DRAWN AEROBIC ONLY Blood Culture results may not be  optimal due to an inadequate volume of blood received in culture bottles   Culture   Final    NO GROWTH 5 DAYS Performed at [facility], [address], [place], [place] [address]    Report Status 05/25/2024 FINAL  Final    Coagulation Studies: No results for input(s): LABPROT, INR in the last 72 hours.  Imaging: CT HEAD WO CONTRAST ( ) Result Date: 05/25/2024 EXAM: CT HEAD WITHOUT 05/24/2024 06:34:49 PM TECHNIQUE: CT of the head was performed without the administration of intravenous contrast. Automated  exposure control, iterative reconstruction, and/or weight based adjustment of the mA/kV was utilized to reduce the radiation dose to as low as reasonably achievable. COMPARISON: CT head 05/20/2024 CLINICAL HISTORY: Neuro deficit, acute, stroke suspected; Family insist on continued encephalopathy, etiology unclear. FINDINGS: BRAIN AND VENTRICLES: No acute intracranial hemorrhage. No mass effect or midline shift. No extra-axial fluid collection. No evidence of acute infarct. Patchy white matter hypodensities, compatible with chronic microvascular ischemic disease. Remote right basal ganglia lacunar infarct. No hydrocephalus. ORBITS: No acute abnormality. SINUSES AND MASTOIDS: No acute abnormality. SOFT TISSUES AND SKULL: No acute skull fracture. No acute soft tissue abnormality. IMPRESSION: 1. No acute intracranial abnormality. 2. Remote right basal ganglia infarct and chronic microvascular ischemic change. Electronically signed by: Gilmore Molt MD 05/25/2024 12:22 AM EST RP Workstation: [id number]    Medications: I have reviewed the patient's current medications. Scheduled:  apixaban   2.5 mg Oral BID   atenolol   25 mg Oral BID   divalproex   125 mg Oral QHS   feeding supplement  237 mL Oral BID BM   isosorbide  mononitrate  60 mg Oral Daily   lisinopril   40 mg Oral Daily   QUEtiapine   12.5 mg Oral QHS    Assessment/Plan: 89 y.o. female with medical history of tremor,  hypertension, hyperlipidemia, type 2 diabetes, atrial fibrillation on warfarin (subtherapeutic INR on presentation), sick sinus syndrome status post pacemaker presenting from her nursing facility for worsening confusion.  Patient unable to provide history.  Per chart, daughter and nursing reported patient had been confused since 12/5. She became confused starting 2 pm and it progressively became worse. At baseline pt uses wheelchair but can use the bathroom by herself. Work up has been fairly unremarkable other than an atrial appendage thrombus seen with CT imaging.  Patient being changed to Eliquis .  INR subtherapeutic at admission.  Patient on presentation was agitated, not following commands and with slurred speech.  Seems improved from this currently. From review of chart patient has had a previous admission for hallucinations and another presentation for altered mental status. At her age the idea that she may be developing a dementia is not out of the range of what is possible.  Unclear why patient is on Depakote  and Seroquel .  The possibility of an acute infarct with her atrial fibrillation and possible atrial appendage thrombus is considerable as well.  Initial head CT reviewed and shows no acute changes. Patient unable to have MRI due to pacemaker but being adequately anticoagulated.  Repeat head CT performed on yesterday was personally reviewed and unchanged from her initial.   TSH, folate, B12 are unremarkable.  ESR mildly elevated.  B1 pending.  Patient appears less agitated that initially.    Recommendations: Patient may benefit from a dementia work up on an outpatient basis   LOS: 5 days   Sonny Hock, MD Neurology  05/25/2024  10:17 AM

## 2024-05-25 NOTE — Plan of Care (Signed)
  Problem: Education: Goal: Knowledge of General Education information will improve Description: Including pain rating scale, medication(s)/side effects and non-pharmacologic comfort measures Outcome: Progressing   Problem: Clinical Measurements: Goal: Will remain free from infection Outcome: Progressing   Problem: Clinical Measurements: Goal: Respiratory complications will improve Outcome: Progressing   Problem: Clinical Measurements: Goal: Cardiovascular complication will be avoided Outcome: Progressing   Problem: Activity: Goal: Risk for activity intolerance will decrease Outcome: Progressing   Problem: Elimination: Goal: Will not experience complications related to urinary retention Outcome: Progressing   Problem: Pain Managment: Goal: General experience of comfort will improve and/or be controlled Outcome: Progressing   Problem: Safety: Goal: Ability to remain free from injury will improve Outcome: Progressing

## 2024-05-25 NOTE — Plan of Care (Signed)
°  Problem: Safety: Goal: Non-violent Restraint(s) Outcome: Progressing   Problem: Education: Goal: Knowledge of General Education information will improve Description: Including pain rating scale, medication(s)/side effects and non-pharmacologic comfort measures Outcome: Progressing   Problem: Health Behavior/Discharge Planning: Goal: Ability to manage health-related needs will improve Outcome: Progressing   Problem: Clinical Measurements: Goal: Ability to maintain clinical measurements within normal limits will improve Outcome: Progressing Goal: Will remain free from infection Outcome: Progressing Goal: Diagnostic test results will improve Outcome: Progressing Goal: Respiratory complications will improve Outcome: Progressing Goal: Cardiovascular complication will be avoided Outcome: Progressing   Problem: Activity: Goal: Risk for activity intolerance will decrease Outcome: Progressing   Problem: Nutrition: Goal: Adequate nutrition will be maintained Outcome: Progressing   Problem: Coping: Goal: Level of anxiety will decrease Outcome: Progressing   Problem: Elimination: Goal: Will not experience complications related to bowel motility Outcome: Progressing Goal: Will not experience complications related to urinary retention Outcome: Progressing   Problem: Pain Managment: Goal: General experience of comfort will improve and/or be controlled Outcome: Progressing   Problem: Safety: Goal: Ability to remain free from injury will improve Outcome: Progressing   Problem: Skin Integrity: Goal: Risk for impaired skin integrity will decrease Outcome: Progressing   Problem: Education: Goal: Ability to describe self-care measures that may prevent or decrease complications (Diabetes Survival Skills Education) will improve Outcome: Progressing Goal: Individualized Educational Video(s) Outcome: Progressing   Problem: Coping: Goal: Ability to adjust to condition or change  in health will improve Outcome: Progressing   Problem: Fluid Volume: Goal: Ability to maintain a balanced intake and output will improve Outcome: Progressing   Problem: Health Behavior/Discharge Planning: Goal: Ability to identify and utilize available resources and services will improve Outcome: Progressing Goal: Ability to manage health-related needs will improve Outcome: Progressing   Problem: Metabolic: Goal: Ability to maintain appropriate glucose levels will improve Outcome: Progressing   Problem: Nutritional: Goal: Maintenance of adequate nutrition will improve Outcome: Progressing Goal: Progress toward achieving an optimal weight will improve Outcome: Progressing   Problem: Skin Integrity: Goal: Risk for impaired skin integrity will decrease Outcome: Progressing   Problem: Tissue Perfusion: Goal: Adequacy of tissue perfusion will improve Outcome: Progressing   Problem: Education: Goal: Knowledge of disease or condition will improve Outcome: Progressing Goal: Understanding of medication regimen will improve Outcome: Progressing Goal: Individualized Educational Video(s) Outcome: Progressing   Problem: Activity: Goal: Ability to tolerate increased activity will improve Outcome: Progressing   Problem: Cardiac: Goal: Ability to achieve and maintain adequate cardiopulmonary perfusion will improve Outcome: Progressing   Problem: Health Behavior/Discharge Planning: Goal: Ability to safely manage health-related needs after discharge will improve Outcome: Progressing   Problem: Education: Goal: Ability to demonstrate management of disease process will improve Outcome: Progressing Goal: Ability to verbalize understanding of medication therapies will improve Outcome: Progressing Goal: Individualized Educational Video(s) Outcome: Progressing   Problem: Activity: Goal: Capacity to carry out activities will improve Outcome: Progressing   Problem:  Cardiac: Goal: Ability to achieve and maintain adequate cardiopulmonary perfusion will improve Outcome: Progressing

## 2024-05-26 LAB — GLUCOSE, CAPILLARY: Glucose-Capillary: 158 mg/dL — ABNORMAL HIGH (ref 70–99)

## 2024-05-26 MED ORDER — APIXABAN 2.5 MG PO TABS: 2.5000 mg | ORAL_TABLET | Freq: Two times a day (BID) | ORAL | 0 refills | Status: AC

## 2024-05-26 NOTE — Evaluation (Signed)
 Clinical/Bedside Swallow Evaluation Patient Details  Name: Leah Holmes MRN: [id number] Date of Birth: February 28, 1927  Today's Date: 05/26/2024 Time: SLP Start Time (ACUTE ONLY): 1201 SLP Stop Time (ACUTE ONLY): 1213 SLP Time Calculation (min) (ACUTE ONLY): 12 min  Past Medical History:  Past Medical History:  Diagnosis Date   Atrial fibrillation (HCC) 2012   Breast cancer (HCC) 04/2018   IDC of left breast    CHF (congestive heart failure) (HCC)    Colon polyp 2003   Compression fracture of L2 lumbar vertebra (HCC)    Coronary artery disease    COVID-19 virus infection 12/25/2019   Diabetes mellitus without complication (HCC)    Dyspnea    Dysrhythmia    atrial fib   Environmental and seasonal allergies    GERD (gastroesophageal reflux disease)    Hyperlipidemia    Hypertension    Hypertensive urgency 03/10/2021   Irritable bowel syndrome    Multiple rib fractures 03/15/2018   Personal history of radiation therapy    Polyp of colon    PONV (postoperative nausea and vomiting)    with ether, not recently woke up in surgery once   Presence of permanent cardiac pacemaker    Sick sinus syndrome (HCC)    Tremor    Tremors of nervous system    Ulcer    Past Surgical History:  Past Surgical History:  Procedure Laterality Date   ABDOMINAL ADHESION SURGERY     ABDOMINAL HYSTERECTOMY     APPENDECTOMY     BREAST BIOPSY Right late 80s/early 90s   benign   BREAST BIOPSY Left 03/31/2018   us  bx done with Dr. Dessa, invasive ductal carcinoma   BREAST LUMPECTOMY Left 04/22/2018   Procedure: BREAST LUMPECTOMY;  Surgeon: Dessa Reyes ORN, MD;  Location: [facility] ORS;  Service: General;  Laterality: Left;   BREAST LUMPECTOMY Left 04/22/2018   [facility], negative LN, clear margins   BREAST LUMPECTOMY Left 2020   positive, done in office   BREAST SURGERY Right    biopsy    CESAREAN SECTION     x 2   CHOLECYSTECTOMY  03/2012   Dr Wonda   COLONOSCOPY  2003   Dr Bedelia in  [place]    fibroid tumor     INTRAMEDULLARY (IM) NAIL INTERTROCHANTERIC Right 10/25/2022   Procedure: INTRAMEDULLARY (IM) NAIL INTERTROCHANTERIC;  Surgeon: Ernie Cough, MD;  Location: [facility] OR;  Service: Orthopedics;  Laterality: Right;   PACEMAKER INSERTION N/A 01/28/2016   Procedure: INSERTION PACEMAKER;  Surgeon: Marsa Dooms, MD;  Location: [facility] ORS;  Service: Cardiovascular;  Laterality: N/A;   SENTINEL NODE BIOPSY Left 04/22/2018   Procedure: SENTINEL NODE BIOPSY;  Surgeon: Dessa Reyes ORN, MD;  Location: [facility] ORS;  Service: General;  Laterality: Left;   UPPER GI ENDOSCOPY  2003   Dr Bedelia in [place]    HPI:  Pt is a 89 year old female presenting with acute encephalopathy to [facility] ED on 05/20/2024 from her nursing facility, work up includes mild hyponatremia, suspected sepsis,  Abnormal CT with possible left atrial thrombus currently on anticoagulation further evaluation with general require TEE (not currently recommended),     PMH significant for hypertension, hyperlipidemia, type 2 diabetes, atrial fibrillation on warfarin, sick sinus syndrome status post pacemaker.    Assessment / Plan / Recommendation  Clinical Impression  ST services consulted for a bedside swallow evaluation d/t observed coughing during breakfast this morning.  Per pt report they told me a long time ago that I have a hiatal  hernia and every once and a while it does bad. I have to be careful and take little bites. It just acted up this morning. A review of pt's chart does reveal history of hiatal hernia.   During this evaluation, pt demonstrated adequate oropharyngeal abilities when consuming soft solid snack and thin liquids via straw. Pt commented there it went, look I am swallowing good. Sometimes spicy stuff makes it act up but I have eaten sausage every morning and no problem. At this time, recommend pt adhere to strict aspiration and reflux precautions but doesn't have any overt s/s of oropharyngeal  dysphagia. All education has been completed. No further ST services are indicated at this time.   SLP Visit Diagnosis: Dysphagia, unspecified (R13.10)    Aspiration Risk  Mild aspiration risk    Diet Recommendation           Other Recommendations Oral Care Recommendations: Oral care BID     Swallow Evaluation Recommendations Recommendations: PO diet PO Diet Recommendation: Dysphagia 2 (Finely chopped);Thin liquids (Level 0) Liquid Administration via: Straw;Cup Medication Administration: Whole meds with liquid Supervision: Patient able to self-feed;Intermittent supervision/cueing for swallowing strategies Swallowing strategies  : Minimize environmental distractions;Slow rate;Small bites/sips Postural changes: Position pt fully upright for meals;Stay upright 30-60 min after meals Oral care recommendations: Oral care BID (2x/day)      Functional Status Assessment Patient has not had a recent decline in their functional status    Swallow Study   General Date of Onset: 05/26/24 HPI: Pt is a 89 year old female presenting with acute encephalopathy to [facility] ED on 05/20/2024 from her nursing facility, work up includes mild hyponatremia, suspected sepsis,  Abnormal CT with possible left atrial thrombus currently on anticoagulation further evaluation with general require TEE (not currently recommended),     PMH significant for hypertension, hyperlipidemia, type 2 diabetes, atrial fibrillation on warfarin, sick sinus syndrome status post pacemaker. Type of Study: Bedside Swallow Evaluation Previous Swallow Assessment: 2024 Diet Prior to this Study: Dysphagia 2 (finely chopped);Thin liquids (Level 0) Temperature Spikes Noted: No Respiratory Status: Room air History of Recent Intubation: No Behavior/Cognition: Alert;Cooperative;Pleasant mood Oral Cavity Assessment: Within Functional Limits Oral Care Completed by SLP: No Oral Cavity - Dentition: Dentures, bottom;Dentures, top Vision:  Functional for self-feeding Self-Feeding Abilities: Able to feed self Patient Positioning: Upright in bed Baseline Vocal Quality: Normal Volitional Cough: Strong Volitional Swallow: Able to elicit    Oral/Motor/Sensory Function Overall Oral Motor/Sensory Function: Within functional limits   Ice Chips Ice chips: Not tested   Thin Liquid Thin Liquid: Within functional limits Presentation: Self Fed;Straw    Nectar Thick Nectar Thick Liquid: Not tested   Honey Thick Honey Thick Liquid: Not tested   Puree Puree: Within functional limits Presentation: Self Fed;Spoon   Solid     Solid: Within functional limits Presentation: Self Fed Other Comments: soft solid     Jocie Meroney B. Rubbie, M.S., CCC-SLP, Tree Surgeon Certified Brain Injury Specialist [facility]  [facility] Office [phone] Ascom [phone] Fax [phone]

## 2024-05-26 NOTE — Progress Notes (Signed)
 Unable to complete admission profile at this time. Pt is choosing not to answer question at the current

## 2024-05-26 NOTE — Discharge Summary (Signed)
 Physician Discharge Summary   Patient: Leah Holmes MRN: [id number] DOB: 1927-03-01  Admit date:     05/20/2024  Discharge date: 05/26/2024  Discharge Physician: Nena Rebel   PCP: Patient, No Pcp Per   Recommendations at discharge:   Continue her home medications Follow-up with primary care as well as cardiology as scheduled as outpatient  Discharge Diagnoses: Principal Problem:   Acute encephalopathy Active Problems:   Essential hypertension, benign   Chronic atrial fibrillation (HCC)   Sick sinus syndrome (HCC)   DM type 2 with diabetic peripheral neuropathy (HCC)   Hyperlipidemia associated with type 2 diabetes mellitus (HCC)   Anxiety  Resolved Problems:   * No resolved hospital problems. [facility] Course: Leah Holmes is a 89 year old female with history of hypertension, hyperlipidemia, atrial fibrillation previously on warfarin, sick sinus syndrome status post pacemaker placement, presented from nursing home for chief concerns of worsening confusion.  05/20/2024: Admitted to [facility] hospitalist service for worsening confusion.  Exact etiology was unknown at that time.  Respiratory panel, ammonia level, EEG was ordered.  Chemical restraints was preferred against physical restraints per daughter.  12/7: Heparin  GTT was continued due to left atrial appendage.  Xanax  was held at that time.  PT, OT evaluated patient.  12/8: EEG read as study within normal limits.  No seizures or epileptiform changes were seen throughout the recording.  IV Haldol  and IV Ativan  as needed ordered.  12/9: Heparin  changed to Eliquis  at this time.  12/10: Neurology consulted for encephalopathy of unknown etiology.  Check B12, B1, ESR, folate, TSH per neurology.  Will recheck a CT head without contrast.  As patient cannot get MRI due to pacemaker placed.  12/12: Neurology advised no further intervention.  B12, folate, TSH came back normal, ESR was 35 but no clinical significance,  B1 pending Patient appeared to be at baseline.  No signs of further worsening.  No signs of infection.  Patient will be discharged back to skilled nursing facility.    Assessment and Plan: Acute encephalopathy Neurology was consulted  Patient appears improved and very close to his baseline per family Per neurology, will repeat a CT scan of the head Check B12, B1, folic acid , ESR, TSH per neurology, except B1 everything came back within normal limit Follow-up with PCP/outpatient neurology as given patient's age, dementia is not outside the realm of possibility  Sick sinus syndrome (HCC) Status post pacemaker  Chronic atrial fibrillation (HCC) Home apixaban  2.5 mg p.o. twice daily resumed Atenolol  25 mg p.o. twice daily  Essential hypertension, benign Home atenolol  25 mg p.o. twice daily, isosorbide  mononitrate 60 mg daily, lisinopril  40 mg daily resumed Patient was given as needed labetalol  for high blood pressure otherwise no other new intervention.  Anxiety PDMP reviewed, alprazolam  0.25 mg 14 days, 14 tablets. Resumed        Consultants: Neurology Procedures performed: CT brain Disposition: Skilled nursing facility Diet recommendation:  Dysphagia type 2 thin Liquid  Dysphagia 2 (Finely chopped);Thin liquids (Level 0) Liquid Administration via: Straw;Cup  DISCHARGE MEDICATION: Allergies as of 05/26/2024       Reactions   Codeine Anaphylaxis   Penicillins Anaphylaxis   Clonidine Derivatives Other (See Comments)   Unknown reaction   Cozaar  [losartan ] Other (See Comments)   Myalgias    Lipitor [atorvastatin] Other (See Comments)   Unknown reaction   Ms Contin [morphine] Other (See Comments)   Unknown reaction   Neurontin  [gabapentin ] Nausea Only   Reglan [metoclopramide] Other (See  Comments)   Tremors    Valtrex  [valacyclovir ] Other (See Comments)   Tremors    Aldactone  [spironolactone ] Itching   Apresoline  [hydralazine ] Anxiety   Norvasc  [amlodipine ]  Itching   Does cause itching. Pt does take PRN if needed.        Medication List     STOP taking these medications    warfarin 5 MG tablet Commonly known as: COUMADIN        TAKE these medications    acetaminophen  500 MG tablet Commonly known as: TYLENOL  Take 1,000 mg by mouth every 6 (six) hours as needed for moderate pain, fever or headache.   ALPRAZolam  0.25 MG tablet Commonly known as: XANAX  Take 0.25 mg by mouth at bedtime.   amLODipine  10 MG tablet Commonly known as: NORVASC  Take 1 tablet (10 mg total) by mouth daily. What changed:  when to take this reasons to take this   apixaban  2.5 MG Tabs tablet Commonly known as: ELIQUIS  Take 1 tablet (2.5 mg total) by mouth 2 (two) times daily.   atenolol  25 MG tablet Commonly known as: TENORMIN  Take 25 mg by mouth 2 (two) times daily.   cetirizine 10 MG tablet Commonly known as: ZYRTEC Take 10 mg by mouth daily.   divalproex  125 MG capsule Commonly known as: DEPAKOTE  SPRINKLE Take 125 mg by mouth at bedtime.   furosemide  20 MG tablet Commonly known as: LASIX  Take 20 mg by mouth daily.   glucose blood test strip Commonly known as: ONE TOUCH ULTRA TEST USE 1 STRIP TO CHECK GLUCOSE TWICE DAILY  Dx code: E11.9   isosorbide  mononitrate 60 MG 24 hr tablet Commonly known as: IMDUR  Take 60 mg by mouth daily.   Lidocaine  HCl 4 % Ptch Apply 1 patch topically daily.   lisinopril  40 MG tablet Commonly known as: ZESTRIL  Take 1 tablet (40 mg total) by mouth 2 (two) times daily. What changed: when to take this   meclizine  25 MG tablet Commonly known as: ANTIVERT  Take 1 tablet by mouth three times daily as needed What changed: reasons to take this   metFORMIN 500 MG tablet Commonly known as: GLUCOPHAGE Take 500 mg by mouth daily.   multivitamin with minerals Tabs tablet Take 1 tablet by mouth daily.   nitroGLYCERIN  0.4 MG SL tablet Commonly known as: NITROSTAT  Place 0.4 mg under the tongue every 5  (five) minutes as needed for chest pain.   OneTouch Delica Plus Lancet33G Misc Place 2 Devices inside cheek daily. DX Code E11.9.   polyethylene glycol 17 g packet Commonly known as: MIRALAX  / GLYCOLAX  Take 17 g by mouth 2 (two) times daily.   potassium chloride  10 MEQ tablet Commonly known as: KLOR-CON  M Take 10 mEq by mouth daily.   QUEtiapine  25 MG tablet Commonly known as: SEROQUEL  Take 0.5 tablets (12.5 mg total) by mouth at bedtime.   senna-docusate 8.6-50 MG tablet Commonly known as: Senokot-S Take 1 tablet by mouth 2 (two) times daily.        Contact information for follow-up providers     Launie Maiden, Ronal Maxwell, NP. Go in 1 week(s).   Specialty: Nurse Practitioner Contact information: [address] [place] [address] [phone]              Contact information for after-discharge care     Destination     [facility] .   Service: Skilled Nursing Contact information: [address] [place]  [address] [phone]  Discharge Exam: Filed Weights   05/24/24 0442 05/25/24 0453 05/26/24 0500  Weight: 51.3 kg 52.9 kg 52.4 kg   Constitutional: Alert, awake, calm, comfortable confused HEENT: Neck supple Respiratory: Clear to auscultation B/L, no wheezing, no rales.  Cardiovascular: Regular rate and rhythm, no murmurs / rubs / gallops. No extremity edema. 2+ pedal pulses. No carotid bruits.  Abdomen: Soft, no tenderness, Bowel sounds positive.  Musculoskeletal: no clubbing / cyanosis. Good ROM, no contractures. Normal muscle tone.  Skin: no rashes, lesions, ulcers. Neurologic: CN 2-12 grossly intact. Sensation intact, No focal deficit identified Psychiatric: Confused   Condition at discharge: good  The results of significant diagnostics from this hospitalization (including imaging, microbiology, ancillary and laboratory) are listed below for reference.   Imaging  Studies: CT HEAD WO CONTRAST ( ) Result Date: 05/25/2024 EXAM: CT HEAD WITHOUT 05/24/2024 06:34:49 PM TECHNIQUE: CT of the head was performed without the administration of intravenous contrast. Automated exposure control, iterative reconstruction, and/or weight based adjustment of the mA/kV was utilized to reduce the radiation dose to as low as reasonably achievable. COMPARISON: CT head 05/20/2024 CLINICAL HISTORY: Neuro deficit, acute, stroke suspected; Family insist on continued encephalopathy, etiology unclear. FINDINGS: BRAIN AND VENTRICLES: No acute intracranial hemorrhage. No mass effect or midline shift. No extra-axial fluid collection. No evidence of acute infarct. Patchy white matter hypodensities, compatible with chronic microvascular ischemic disease. Remote right basal ganglia lacunar infarct. No hydrocephalus. ORBITS: No acute abnormality. SINUSES AND MASTOIDS: No acute abnormality. SOFT TISSUES AND SKULL: No acute skull fracture. No acute soft tissue abnormality. IMPRESSION: 1. No acute intracranial abnormality. 2. Remote right basal ganglia infarct and chronic microvascular ischemic change. Electronically signed by: Gilmore Molt MD 05/25/2024 12:22 AM EST RP Workstation: [id number]   EEG adult Result Date: 05/22/2024 Shelton Arlin KIDD, MD     05/22/2024  2:14 PM Patient Name: Elverna Caffee MRN: [id number] Epilepsy Attending: Arlin KIDD Shelton Referring Physician/Provider: Fernand Prost, MD Date: 05/22/2024 Duration: 27.54 mins Patient history: 89yo F with ams. EEG to evaluate for seizure Level of alertness: Awake AEDs during EEG study: None Technical aspects: This EEG study was done with scalp electrodes positioned according to the 10-20 International system of electrode placement. Electrical activity was reviewed with band pass filter of 1-70Hz , sensitivity of 7 uV/mm, display speed of 41mm/sec with a 60Hz  notched filter applied as appropriate. EEG data were recorded continuously and  digitally stored.  Video monitoring was available and reviewed as appropriate. Description: The posterior dominant rhythm consists of 9 Hz activity of moderate voltage (25-35 uV) seen predominantly in posterior head regions, symmetric and reactive to eye opening and eye closing. Hyperventilation and photic stimulation were not performed.   IMPRESSION: This study is within normal limits. No seizures or epileptiform discharges were seen throughout the recording. A normal interictal EEG does not exclude the diagnosis of epilepsy. Arlin KIDD Shelton   ECHOCARDIOGRAM COMPLETE Result Date: 05/20/2024    ECHOCARDIOGRAM REPORT   Patient Name:   GLENDER AUGUSTA Girton Date of Exam: 05/20/2024 Medical Rec #:  [id number]               Height:       66.0 in Accession #:    [id number]              Weight:       130.7 lb Date of Birth:  1927-01-18               BSA:  1.669 m Patient Age:    89 years                BP:           176/69 mmHg Patient Gender: F                       HR:           71 bpm. Exam Location:  [facility] Procedure: 2D Echo, Cardiac Doppler and Color Doppler (Both Spectral and Color            Flow Doppler were utilized during procedure). STAT ECHO Indications:     Thrombus of atrial appendage  History:         Patient has prior history of Echocardiogram examinations, most                  recent 10/26/2022.  Sonographer:     Thedora Louder RDCS, FASE Referring Phys:  [id number] MORENE BATHE Diagnosing Phys: Cara JONETTA Lovelace MD  Sonographer Comments: Technically challenging study due to limited acoustic windows and suboptimal apical window. Image acquisition challenging due to uncooperative patient and Image acquisition challenging due to patient behavioral factors. Very difficult study to complete, despite patient given ativan . IMPRESSIONS  1. Left ventricular ejection fraction, by estimation, is 55 to 60%. The left ventricle has normal function. The left ventricle demonstrates regional wall motion  abnormalities (see scoring diagram/findings for description). Left ventricular diastolic parameters are consistent with Grade III diastolic dysfunction (restrictive).  2. Right ventricular systolic function is normal. The right ventricular size is mildly enlarged.  3. Poor windows , no thrombus seen.. Left atrial size was severely dilated.  4. Right atrial size was mild to moderately dilated.  5. 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  6. Tricuspid valve regurgitation is mild to moderate.  7. The aortic valve is calcified. Aortic valve regurgitation is trivial. Severe aortic valve stenosis. FINDINGS  Left Ventricle: Left ventricular ejection fraction, by estimation, is 55 to 60%. The left ventricle has normal function. The left ventricle demonstrates regional wall motion abnormalities. Strain was performed and the global longitudinal strain is indeterminate. The left ventricular internal cavity size was normal in size. There is borderline concentric left ventricular hypertrophy. Left ventricular diastolic parameters are consistent with Grade III diastolic dysfunction (restrictive). Right Ventricle: The right ventricular size is mildly enlarged. No increase in right ventricular wall thickness. Right ventricular systolic function is normal. Left Atrium: Poor windows , no thrombus seen. Left atrial size was severely dilated. Right Atrium: Right atrial size was mild to moderately dilated. Pericardium: There is no evidence of pericardial effusion. Mitral Valve: The mitral valve is myxomatous. There is moderate thickening of the mitral valve leaflet(s). There is moderate calcification of the mitral valve leaflet(s). Mild to moderate mitral valve regurgitation. MV peak gradient, 9.6 mmHg. The mean mitral valve gradient is 3.0 mmHg. Tricuspid Valve: The tricuspid valve is normal in structure. Tricuspid valve regurgitation is mild to moderate. Aortic Valve: The aortic valve is calcified.  Aortic valve regurgitation is trivial. Severe aortic stenosis is present. Aortic valve mean gradient measures 38.5 mmHg. Aortic valve peak gradient measures 68.2 mmHg. Aortic valve area, by VTI measures 0.29 cm. Pulmonic Valve: The pulmonic valve was not well visualized. Pulmonic valve regurgitation is trivial. Aorta: The ascending aorta was not well visualized. IAS/Shunts: No atrial level shunt detected by color flow Doppler. Additional Comments: 3D was performed not requiring  image post processing on an independent workstation and was indeterminate. A device lead is visualized.  LEFT VENTRICLE PLAX 2D LVIDd:         3.70 cm   Diastology LVIDs:         2.60 cm   LV e' medial:    5.22 cm/s LV PW:         1.10 cm   LV E/e' medial:  31.6 LV IVS:        1.20 cm   LV e' lateral:   7.62 cm/s LVOT diam:     1.80 cm   LV E/e' lateral: 21.7 LV SV:         29 LV SV Index:   17 LVOT Area:     2.54 cm  LEFT ATRIUM           Index LA diam:      5.50 cm 3.30 cm/m LA Vol (A4C): 87.0 ml 52.13 ml/m  AORTIC VALVE                     PULMONIC VALVE AV Area (Vmax):    0.33 cm      PV Vmax:          0.76 m/s AV Area (Vmean):   0.31 cm      PV Peak grad:     2.3 mmHg AV Area (VTI):     0.29 cm      PR End Diast Vel: 6.97 msec AV Vmax:           413.00 cm/s   RVOT Peak grad:   2 mmHg AV Vmean:          289.500 cm/s AV VTI:            1.005 m AV Peak Grad:      68.2 mmHg AV Mean Grad:      38.5 mmHg LVOT Vmax:         54.10 cm/s LVOT Vmean:        35.600 cm/s LVOT VTI:          0.114 m LVOT/AV VTI ratio: 0.11  AORTA Ao Root diam: 3.00 cm Ao Asc diam:  2.80 cm MITRAL VALVE                TRICUSPID VALVE MV Area (PHT): 4.21 cm     TR Peak grad:   30.9 mmHg MV Area VTI:   0.96 cm     TR Vmax:        278.00 cm/s MV Peak grad:  9.6 mmHg MV Mean grad:  3.0 mmHg     SHUNTS MV Vmax:       1.55 m/s     Systemic VTI:  0.11 m MV Vmean:      75.0 cm/s    Systemic Diam: 1.80 cm MV Decel Time: 180 msec MV E velocity: 165.00 cm/s MV A velocity:  68.10 cm/s MV E/A ratio:  2.42 Dwayne D Callwood MD Electronically signed by Cara JONETTA Lovelace MD Signature Date/Time: 05/20/2024/5:13:11 PM    Final    CT CHEST ABDOMEN PELVIS W CONTRAST Result Date: 05/20/2024 EXAM: CT CHEST, ABDOMEN AND PELVIS WITH CONTRAST 05/20/2024 11:50:50 AM TECHNIQUE: CT of the chest, abdomen and pelvis was performed with the administration of intravenous contrast. 100 mL of iohexol  (OMNIPAQUE ) 300 MG/ML solution was administered. Multiplanar reformatted images are provided for review. Automated exposure control, iterative reconstruction, and/or weight based adjustment of the mA/kV was utilized  to reduce the radiation dose to as low as reasonably achievable. COMPARISON: 11/01/2022 status post cholecystectomy. CLINICAL HISTORY: ams, fever FINDINGS: CHEST: MEDIASTINUM AND LYMPH NODES: Aortic and mitral valve calcifications are noted. Probable thrombus is noted in the left atrial appendage. Coronary artery calcifications are noted. Pericardium is unremarkable. The central airways are clear. No mediastinal, hilar or axillary lymphadenopathy. LUNGS AND PLEURA: Small bilateral pleural effusions are noted with minimal adjacent sub-some atelectasis. No focal consolidation or pulmonary edema. No pneumothorax. ABDOMEN AND PELVIS: LIVER: The liver is unremarkable. GALLBLADDER AND BILE DUCTS: Status post cholecystectomy. No biliary ductal dilatation. SPLEEN: No acute abnormality. PANCREAS: No acute abnormality. ADRENAL GLANDS: No acute abnormality. KIDNEYS, URETERS AND BLADDER: No stones in the kidneys or ureters. No hydronephrosis. No perinephric or periureteral stranding. Urinary bladder is unremarkable. GI AND BOWEL: Stomach demonstrates no acute abnormality. There is no bowel obstruction. REPRODUCTIVE ORGANS: No acute abnormality. PERITONEUM AND RETROPERITONEUM: No ascites. No free air. VASCULATURE: Aorta is normal in caliber. Aortic atherosclerosis. ABDOMINAL AND PELVIS LYMPH NODES: No  lymphadenopathy. BONES AND SOFT TISSUES: Old L1 and L2 compression fractures are noted. No acute osseous abnormality. No focal soft tissue abnormality. IMPRESSION: 1. Probable thrombus in the left atrial appendage. 2. Small bilateral pleural effusions with minimal adjacent subsegmental atelectasis. Electronically signed by: Lynwood Seip MD 05/20/2024 12:28 PM EST RP Workstation: [id number]   CT HEAD WO CONTRAST ( ) Result Date: 05/20/2024 EXAM: CT HEAD WITHOUT CONTRAST 05/20/2024 10:40:00 AM TECHNIQUE: CT of the head was performed without the administration of intravenous contrast. Automated exposure control, iterative reconstruction, and/or weight based adjustment of the mA/kV was utilized to reduce the radiation dose to as low as reasonably achievable. COMPARISON: CT head without contrast 10/24/2022. CLINICAL HISTORY: Altered mental status. FINDINGS: BRAIN AND VENTRICLES: No acute hemorrhage. A remote lacunar infarct is present in the right lateral nucleus. Moderate atrophy and white matter changes are similar to the prior exam. Atherosclerotic calcifications are present in the cavernous carotid arteries bilaterally and at the dural margin of both vertebral arteries. No hyperdense vessel is present. No hydrocephalus. No extra-axial collection. No mass effect or midline shift. ORBITS: A left lens replacement is present. SINUSES: No acute abnormality. SOFT TISSUES AND SKULL: No acute soft tissue abnormality. No skull fracture. IMPRESSION: 1. No acute intracranial abnormality. 2. Remote lacunar infarct in the right lateral nucleus. 3. Moderate cerebral atrophy and white matter changes, similar to the prior exam. 4. Atherosclerotic calcifications in the cavernous carotid arteries bilaterally and at the dural margins of both vertebral arteries. Electronically signed by: Lonni Necessary MD 05/20/2024 10:51 AM EST RP Workstation: [id number]   DG Chest 1 View Result Date: 05/20/2024 EXAM: 1 VIEW(S) XRAY OF THE  CHEST 05/20/2024 08:30:00 AM COMPARISON: 10/24/2022 CLINICAL HISTORY: ams FINDINGS: LUNGS AND PLEURA: No focal pulmonary opacity. No pleural effusion. No pneumothorax. HEART AND MEDIASTINUM: Left single lead cardiac device in place with lead in right ventricle. Aortic atherosclerosis. Borderline cardiomegaly. BONES AND SOFT TISSUES: No acute osseous abnormality. IMPRESSION: 1. No acute cardiopulmonary process. 2. Borderline cardiomegaly. 3. Aortic atherosclerosis. Electronically signed by: Waddell Calk MD 05/20/2024 08:45 AM EST RP Workstation: [id number]    Microbiology: Results for orders placed or performed during the hospital encounter of 05/20/24  Urine Culture (for pregnant, neutropenic or urologic patients or patients with an indwelling urinary catheter)     Status: None   Collection Time: 05/20/24  8:40 AM   Specimen: Urine, Random  Result Value Ref Range Status   Specimen Description   Final  URINE, RANDOM Performed at [facility], [address] [place], [place] [address]    Special Requests   Final    NONE Performed at [facility], [address] [place], [place] [address]    Culture   Final    NO GROWTH Performed at [facility], [address] [place], [place] [address]    Report Status 05/21/2024 FINAL  Final  Resp panel by RT-PCR (RSV, Flu A&B, Covid) Anterior Nasal Swab     Status: None   Collection Time: 05/20/24 10:57 AM   Specimen: Anterior Nasal Swab  Result Value Ref Range Status   SARS Coronavirus 2 by RT PCR NEGATIVE NEGATIVE Final    Comment: (NOTE) SARS-CoV-2 target nucleic acids are NOT DETECTED.  The SARS-CoV-2 RNA is generally detectable in upper respiratory specimens during the acute phase of infection. The lowest concentration of SARS-CoV-2 viral copies this assay can detect is 138 copies/mL. A negative result does not preclude SARS-Cov-2 infection and should not be used as the sole basis for treatment or other patient  management decisions. A negative result may occur with  improper specimen collection/handling, submission of specimen other than nasopharyngeal swab, presence of viral mutation(s) within the areas targeted by this assay, and inadequate number of viral copies(<138 copies/mL). A negative result must be combined with clinical observations, patient history, and epidemiological information. The expected result is Negative.  Fact Sheet for Patients:  bloggercourse.com  Fact Sheet for Healthcare Providers:  seriousbroker.it  This test is no t yet approved or cleared by the [place]  FDA and  has been authorized for detection and/or diagnosis of SARS-CoV-2 by FDA under an Emergency Use Authorization (EUA). This EUA will remain  in effect (meaning this test can be used) for the duration of the COVID-19 declaration under Section 564(b)(1) of the Act, 21 U.S.C.section 360bbb-3(b)(1), unless the authorization is terminated  or revoked sooner.       Influenza A by PCR NEGATIVE NEGATIVE Final   Influenza B by PCR NEGATIVE NEGATIVE Final    Comment: (NOTE) The Xpert Xpress SARS-CoV-2/FLU/RSV plus assay is intended as an aid in the diagnosis of influenza from Nasopharyngeal swab specimens and should not be used as a sole basis for treatment. Nasal washings and aspirates are unacceptable for Xpert Xpress SARS-CoV-2/FLU/RSV testing.  Fact Sheet for Patients: bloggercourse.com  Fact Sheet for Healthcare Providers: seriousbroker.it  This test is not yet approved or cleared by the [place]  FDA and has been authorized for detection and/or diagnosis of SARS-CoV-2 by FDA under an Emergency Use Authorization (EUA). This EUA will remain in effect (meaning this test can be used) for the duration of the COVID-19 declaration under Section 564(b)(1) of the Act, 21 U.S.C. section 360bbb-3(b)(1),  unless the authorization is terminated or revoked.     Resp Syncytial Virus by PCR NEGATIVE NEGATIVE Final    Comment: (NOTE) Fact Sheet for Patients: bloggercourse.com  Fact Sheet for Healthcare Providers: seriousbroker.it  This test is not yet approved or cleared by the [place]  FDA and has been authorized for detection and/or diagnosis of SARS-CoV-2 by FDA under an Emergency Use Authorization (EUA). This EUA will remain in effect (meaning this test can be used) for the duration of the COVID-19 declaration under Section 564(b)(1) of the Act, 21 U.S.C. section 360bbb-3(b)(1), unless the authorization is terminated or revoked.  Performed at [facility], [address], [place], [place] [address]   Respiratory (~20 pathogens) panel by PCR  Status: None   Collection Time: 05/20/24  4:07 PM   Specimen: Nasopharyngeal Swab; Respiratory  Result Value Ref Range Status   Adenovirus NOT DETECTED NOT DETECTED Final   Coronavirus 229E NOT DETECTED NOT DETECTED Final    Comment: (NOTE) The Coronavirus on the Respiratory Panel, DOES NOT test for the novel  Coronavirus (2019 nCoV)    Coronavirus HKU1 NOT DETECTED NOT DETECTED Final   Coronavirus NL63 NOT DETECTED NOT DETECTED Final   Coronavirus OC43 NOT DETECTED NOT DETECTED Final   Metapneumovirus NOT DETECTED NOT DETECTED Final   Rhinovirus / Enterovirus NOT DETECTED NOT DETECTED Final   Influenza A NOT DETECTED NOT DETECTED Final   Influenza B NOT DETECTED NOT DETECTED Final   Parainfluenza Virus 1 NOT DETECTED NOT DETECTED Final   Parainfluenza Virus 2 NOT DETECTED NOT DETECTED Final   Parainfluenza Virus 3 NOT DETECTED NOT DETECTED Final   Parainfluenza Virus 4 NOT DETECTED NOT DETECTED Final   Respiratory Syncytial Virus NOT DETECTED NOT DETECTED Final   Bordetella pertussis NOT DETECTED NOT DETECTED Final   Bordetella Parapertussis NOT DETECTED NOT DETECTED  Final   Chlamydophila pneumoniae NOT DETECTED NOT DETECTED Final   Mycoplasma pneumoniae NOT DETECTED NOT DETECTED Final    Comment: Performed at [facility], [address] [place], [place] [address]  MRSA Next Gen by PCR, Nasal     Status: None   Collection Time: 05/20/24  7:49 PM   Specimen: Nasal Mucosa; Nasal Swab  Result Value Ref Range Status   MRSA by PCR Next Gen NOT DETECTED NOT DETECTED Final    Comment: (NOTE) The GeneXpert MRSA Assay (FDA approved for NASAL specimens only), is one component of a comprehensive MRSA colonization surveillance program. It is not intended to diagnose MRSA infection nor to guide or monitor treatment for MRSA infections. Test performance is not FDA approved in patients less than 88 years old. Performed at [facility], [address] [place], [place] [address]   Culture, blood (Routine X 2) w Reflex to ID Panel     Status: None   Collection Time: 05/20/24  9:52 PM   Specimen: Right Antecubital; Blood  Result Value Ref Range Status   Specimen Description RIGHT ANTECUBITAL  Final   Special Requests   Final    BOTTLES DRAWN AEROBIC AND ANAEROBIC Blood Culture adequate volume   Culture   Final    NO GROWTH 5 DAYS Performed at [facility], [address] [place], [place] [address]    Report Status 05/25/2024 FINAL  Final  Culture, blood (Routine X 2) w Reflex to ID Panel     Status: None   Collection Time: 05/20/24  9:52 PM   Specimen: BLOOD RIGHT HAND  Result Value Ref Range Status   Specimen Description BLOOD RIGHT HAND  Final   Special Requests   Final    BOTTLES DRAWN AEROBIC ONLY Blood Culture results may not be optimal due to an inadequate volume of blood received in culture bottles   Culture   Final    NO GROWTH 5 DAYS Performed at [facility], [address] [place], [place] [address]    Report Status 05/25/2024 FINAL  Final    Labs: CBC: Recent Labs  Lab 05/20/24 0820 05/21/24 0610  05/22/24 0541 05/23/24 0406  WBC 7.8 6.7 9.4 8.1  NEUTROABS 5.5  --   --   --   HGB 13.6 14.6 14.0 12.4  HCT 40.6 43.8 41.3 35.6*  MCV 95.5 97.8 94.3 93.7  PLT 244 221 248 217   Basic Metabolic Panel: Recent Labs  Lab 05/20/24 0820 05/21/24 0610  NA 134* 134*  K 4.2 3.9  CL 98 99  CO2 25 24  GLUCOSE 141* 105*  BUN 14 10  CREATININE 0.57 0.47  CALCIUM  9.4 9.5  MG 1.9  --    Liver Function Tests: Recent Labs  Lab 05/20/24 0820  AST 21  ALT 13  ALKPHOS 59  BILITOT 0.7  PROT 7.1  ALBUMIN 4.2   CBG: Recent Labs  Lab 05/23/24 0800 05/24/24 0733 05/25/24 0815 05/25/24 1156 05/26/24 0834  GLUCAP 160* 161* 160* 193* 158*    Discharge time spent: greater than 30 minutes.  Signed: Nena Rebel, MD [facility] 05/26/2024

## 2024-05-26 NOTE — TOC Transition Note (Signed)
 Transition of Care [facility]) - Discharge Note   Patient Details  Name: Leah Holmes MRN: [id number] Date of Birth: [birth year]  Transition of Care [facility]) CM/SW Contact:  Dalia GORMAN Fuse, RN Phone Number: 05/26/2024, 3:09 PM   Clinical Narrative:     The patient is medically clear to discharge to [facility] for STR. The patient's daughter Donny is in agreement with the dc plan. [facility] to transport.  Nurse to call report to [facility] [phone]  Final next level of care: Skilled Nursing Facility Barriers to Discharge: Barriers Resolved   Patient Goals and CMS Choice            Discharge Placement              Patient chooses bed at:  [facility]) Patient to be transferred to facility by: [facility] Name of family member notified: Daughter Ms. Stephenson Patient and family notified of of transfer: 05/26/24  Discharge Plan and Services Additional resources added to the After Visit Summary for   In-house Referral: Clinical Social Work Discharge Planning Services: CM Consult                                 Social Drivers of Health (SDOH) Interventions SDOH Screenings   Food Insecurity: No Food Insecurity (05/20/2024)  Housing: Low Risk (05/20/2024)  Transportation Needs: No Transportation Needs (05/20/2024)  Utilities: Not At Risk (05/20/2024)  Depression (PHQ2-9): Medium Risk (10/23/2022)  Financial Resource Strain: Low Risk (09/03/2022)  Physical Activity: Unknown (09/03/2022)  Social Connections: Unknown (09/03/2022)  Stress: No Stress Concern Present (09/03/2022)  Tobacco Use: Medium Risk (05/20/2024)     Readmission Risk Interventions     No data to display

## 2024-05-26 NOTE — Progress Notes (Signed)
 Physical Therapy Treatment Patient Details Name: Leah Holmes MRN: [id number] DOB: September 28, 1926 Today's Date: 05/26/2024   History of Present Illness Pt is a 89 year old female presenting with acute encephalopathy from her nursing facility, work up includes mild hyponatremia, suspected sepsis,  Abnormal CT with possible left atrial thrombus currently on anticoagulation further evaluation with general require TEE (not currently recommended),     PMH significant for hypertension, hyperlipidemia, type 2 diabetes, atrial fibrillation on warfarin, sick sinus syndrome status post pacemaker    PT Comments  Pt was long sitting in bed with telesitter in room. Pt was watching TV but unable to state what she was watching. She remains pleasantly confused throughout session. Only oriented to self and hospital. She required physical assistance only for getting out/in bed. CGA for standing and ambulating with RW x 120 ft. Distance limited by fatigue but overall she tolerated ambulation well. Dc recs remain appropriate to maximize her independence and safety with all ADLs.     If plan is discharge home, recommend the following: A little help with walking and/or transfers;A little help with bathing/dressing/bathroom;Assistance with cooking/housework;Direct supervision/assist for medications management;Direct supervision/assist for financial management;Help with stairs or ramp for entrance;Assist for transportation;Supervision due to cognitive status     Equipment Recommendations  Other (comment) (defer to next level of care)       Precautions / Restrictions Precautions Precautions: Fall Recall of Precautions/Restrictions: Impaired Restrictions Weight Bearing Restrictions Per Provider Order: No     Mobility  Bed Mobility Overal bed mobility: Needs Assistance Bed Mobility: Supine to Sit, Sit to Supine  Supine to sit: Contact guard, Min assist Sit to supine: Min assist General bed mobility  comments: pt only requires physical assistance for bed mobility. Once EOB could stand and ambulate without physical assist    Transfers Overall transfer level: Needs assistance Equipment used: Rolling walker (2 wheels) Transfers: Sit to/from Stand Sit to Stand: Contact guard assist  General transfer comment: CGA for safety    Ambulation/Gait Ambulation/Gait assistance: Contact guard assist Gait Distance (Feet): 120 Feet Assistive device: Rolling walker (2 wheels) Gait Pattern/deviations: Step-through pattern, Trunk flexed, Narrow base of support Gait velocity: decreased  General Gait Details: Pt tends to ambulate with tandem gait/narrow BOS. no LOB but encouraged to improve posture and widen BOS   Balance Overall balance assessment: Needs assistance Sitting-balance support: Feet supported Sitting balance-[name]: Good   Postural control: Posterior lean Standing balance support: Bilateral upper extremity supported Standing balance-[name]: Fair Standing balance comment: pt is at risk of falls due to narrow BOS/tandem gait pattern       Communication Communication Communication: Impaired Factors Affecting Communication: Hearing impaired  Cognition Arousal: Alert Behavior During Therapy: WFL for tasks assessed/performed   PT - Cognitive impairments: History of cognitive impairments      PT - Cognition Comments: Pt is A but disoriented. pleasnatly confused this session. does follow simple commands but poor overall awareness of situation Following commands: Impaired Following commands impaired: Follows one step commands with increased time, Only follows one step commands consistently    Cueing Cueing Techniques: Verbal cues, Tactile cues, Visual cues         Pertinent Vitals/Pain Pain Assessment Pain Assessment: No/denies pain Pain Intervention(s): Limited activity within patient's tolerance, Monitored during session, Repositioned     PT Goals (current goals  can now be found in the care plan section) Acute Rehab PT Goals Patient Stated Goal: return to her where people know her Progress towards PT  goals: Progressing toward goals    Frequency    Min 2X/week       Co-evaluation     PT goals addressed during session: Mobility/safety with mobility;Balance;Proper use of DME        AM-PAC PT 6 Clicks Mobility   Outcome Measure  Help needed turning from your back to your side while in a flat bed without using bedrails?: None Help needed moving from lying on your back to sitting on the side of a flat bed without using bedrails?: A Little Help needed moving to and from a bed to a chair (including a wheelchair)?: A Little Help needed standing up from a chair using your arms (e.g., wheelchair or bedside chair)?: A Little Help needed to walk in hospital room?: A Little Help needed climbing 3-5 steps with a railing? : A Little 6 Click Score: 19    End of Session   Activity Tolerance: Patient tolerated treatment well;Patient limited by fatigue Patient left: with call bell/phone within reach;with bed alarm set (telesitter) Nurse Communication: Mobility status PT Visit Diagnosis: Unsteadiness on feet (R26.81);Other abnormalities of gait and mobility (R26.89);Muscle weakness (generalized) (M62.81)     Time: 8562-8552 PT Time Calculation (min) (ACUTE ONLY): 10 min  Charges:    $Gait Training: 8-22 mins PT General Charges $$ ACUTE PT VISIT: 1 Visit                    Rankin Essex PTA 05/26/2024, 2:57 PM

## 2024-05-26 NOTE — Consult Note (Signed)
 Subjective: Patient stable today.  Continues to complain of pain throughout  Objective: Current vital signs: BP (!) 198/73 (BP Location: Left Arm)   Pulse 60   Temp 98.5 F (36.9 C) (Oral)   Resp 18   Ht 5' 6 (1.676 m)   Wt 52.4 kg   SpO2 97%   BMI 18.65 kg/m  Vital signs in last 24 hours: Temp:  [98 F (36.7 C)-98.5 F (36.9 C)] 98.5 F (36.9 C) (12/12 0750) Pulse Rate:  [59-63] 60 (12/12 0750) Resp:  [18] 18 (12/12 0750) BP: (148-198)/(66-101) 198/73 (12/12 0750) SpO2:  [97 %-98 %] 97 % (12/12 0750) Weight:  [52.4 kg] 52.4 kg (12/12 0500)  Intake/Output from previous day: 12/11 0701 - 12/12 0700 In: 340 [P.O.:340] Out: 200 [Urine:200] Intake/Output this shift: Total I/O In: 240 [P.O.:240] Out: -  Nutritional status:  Diet Order             DIET DYS 2 Room service appropriate? Yes; Fluid consistency: Thin  Diet effective now                   Neurologic Exam: Mental Status: Alert. Knew her age and where she was.  Speech fluent without evidence of aphasia.  Able to follow 3 step commands without difficulty.  Mildly dysarthric Cranial Nerves: II: Visual fields grossly normal III,IV, VI: ptosis not present, extra-ocular motions intact bilaterally V,VII: smile symmetric, facial light touch sensation normal bilaterally VIII: hearing normal bilaterally XI: bilateral shoulder shrug XII: midline tongue extension Motor: Able to lift all extremities against gravity with no focal weakness appreciated.   Sensory: Pinprick and light touch intact throughout, bilaterally Cerebellar: Tremor improved but present Gait: not tested due to safety concerns  Lab Results: Basic Metabolic Panel: Recent Labs  Lab 05/20/24 0820 05/21/24 0610  NA 134* 134*  K 4.2 3.9  CL 98 99  CO2 25 24  GLUCOSE 141* 105*  BUN 14 10  CREATININE 0.57 0.47  CALCIUM  9.4 9.5  MG 1.9  --     Liver Function Tests: Recent Labs  Lab 05/20/24 0820  AST 21  ALT 13  ALKPHOS 59   BILITOT 0.7  PROT 7.1  ALBUMIN 4.2   No results for input(s): LIPASE, AMYLASE in the last 168 hours. Recent Labs  Lab 05/20/24 2150  AMMONIA 24    CBC: Recent Labs  Lab 05/20/24 0820 05/21/24 0610 05/22/24 0541 05/23/24 0406  WBC 7.8 6.7 9.4 8.1  NEUTROABS 5.5  --   --   --   HGB 13.6 14.6 14.0 12.4  HCT 40.6 43.8 41.3 35.6*  MCV 95.5 97.8 94.3 93.7  PLT 244 221 248 217    Cardiac Enzymes: No results for input(s): CKTOTAL, CKMB, CKMBINDEX, TROPONINI in the last 168 hours.  Lipid Panel: No results for input(s): CHOL, TRIG, HDL, CHOLHDL, VLDL, LDLCALC in the last 168 hours.  CBG: Recent Labs  Lab 05/23/24 0800 05/24/24 0733 05/25/24 0815 05/25/24 1156 05/26/24 0834  GLUCAP 160* 161* 160* 193* 158*    Microbiology: Results for orders placed or performed during the hospital encounter of 05/20/24  Urine Culture (for pregnant, neutropenic or urologic patients or patients with an indwelling urinary catheter)     Status: None   Collection Time: 05/20/24  8:40 AM   Specimen: Urine, Random  Result Value Ref Range Status   Specimen Description   Final    URINE, RANDOM Performed at [facility], [address], [place], [place] [address]  Special Requests   Final    NONE Performed at [facility], [address] [place], [place] [address]    Culture   Final    NO GROWTH Performed at [facility], [address], [place], [place] [address]    Report Status 05/21/2024 FINAL  Final  Resp panel by RT-PCR (RSV, Flu A&B, Covid) Anterior Nasal Swab     Status: None   Collection Time: 05/20/24 10:57 AM   Specimen: Anterior Nasal Swab  Result Value Ref Range Status   SARS Coronavirus 2 by RT PCR NEGATIVE NEGATIVE Final    Comment: (NOTE) SARS-CoV-2 target nucleic acids are NOT DETECTED.  The SARS-CoV-2 RNA is generally detectable in upper respiratory specimens during the acute phase of infection. The  lowest concentration of SARS-CoV-2 viral copies this assay can detect is 138 copies/mL. A negative result does not preclude SARS-Cov-2 infection and should not be used as the sole basis for treatment or other patient management decisions. A negative result may occur with  improper specimen collection/handling, submission of specimen other than nasopharyngeal swab, presence of viral mutation(s) within the areas targeted by this assay, and inadequate number of viral copies(<138 copies/mL). A negative result must be combined with clinical observations, patient history, and epidemiological information. The expected result is Negative.  Fact Sheet for Patients:  bloggercourse.com  Fact Sheet for Healthcare Providers:  seriousbroker.it  This test is no t yet approved or cleared by the [place]  FDA and  has been authorized for detection and/or diagnosis of SARS-CoV-2 by FDA under an Emergency Use Authorization (EUA). This EUA will remain  in effect (meaning this test can be used) for the duration of the COVID-19 declaration under Section 564(b)(1) of the Act, 21 U.S.C.section 360bbb-3(b)(1), unless the authorization is terminated  or revoked sooner.       Influenza A by PCR NEGATIVE NEGATIVE Final   Influenza B by PCR NEGATIVE NEGATIVE Final    Comment: (NOTE) The Xpert Xpress SARS-CoV-2/FLU/RSV plus assay is intended as an aid in the diagnosis of influenza from Nasopharyngeal swab specimens and should not be used as a sole basis for treatment. Nasal washings and aspirates are unacceptable for Xpert Xpress SARS-CoV-2/FLU/RSV testing.  Fact Sheet for Patients: bloggercourse.com  Fact Sheet for Healthcare Providers: seriousbroker.it  This test is not yet approved or cleared by the [place]  FDA and has been authorized for detection and/or diagnosis of SARS-CoV-2 by FDA under  an Emergency Use Authorization (EUA). This EUA will remain in effect (meaning this test can be used) for the duration of the COVID-19 declaration under Section 564(b)(1) of the Act, 21 U.S.C. section 360bbb-3(b)(1), unless the authorization is terminated or revoked.     Resp Syncytial Virus by PCR NEGATIVE NEGATIVE Final    Comment: (NOTE) Fact Sheet for Patients: bloggercourse.com  Fact Sheet for Healthcare Providers: seriousbroker.it  This test is not yet approved or cleared by the [place]  FDA and has been authorized for detection and/or diagnosis of SARS-CoV-2 by FDA under an Emergency Use Authorization (EUA). This EUA will remain in effect (meaning this test can be used) for the duration of the COVID-19 declaration under Section 564(b)(1) of the Act, 21 U.S.C. section 360bbb-3(b)(1), unless the authorization is terminated or revoked.  Performed at [facility], [address] [place], [place] [address]   Respiratory (~20 pathogens) panel by PCR     Status: None   Collection Time: 05/20/24  4:07 PM   Specimen: Nasopharyngeal Swab;  Respiratory  Result Value Ref Range Status   Adenovirus NOT DETECTED NOT DETECTED Final   Coronavirus 229E NOT DETECTED NOT DETECTED Final    Comment: (NOTE) The Coronavirus on the Respiratory Panel, DOES NOT test for the novel  Coronavirus (2019 nCoV)    Coronavirus HKU1 NOT DETECTED NOT DETECTED Final   Coronavirus NL63 NOT DETECTED NOT DETECTED Final   Coronavirus OC43 NOT DETECTED NOT DETECTED Final   Metapneumovirus NOT DETECTED NOT DETECTED Final   Rhinovirus / Enterovirus NOT DETECTED NOT DETECTED Final   Influenza A NOT DETECTED NOT DETECTED Final   Influenza B NOT DETECTED NOT DETECTED Final   Parainfluenza Virus 1 NOT DETECTED NOT DETECTED Final   Parainfluenza Virus 2 NOT DETECTED NOT DETECTED Final   Parainfluenza Virus 3 NOT DETECTED NOT DETECTED Final    Parainfluenza Virus 4 NOT DETECTED NOT DETECTED Final   Respiratory Syncytial Virus NOT DETECTED NOT DETECTED Final   Bordetella pertussis NOT DETECTED NOT DETECTED Final   Bordetella Parapertussis NOT DETECTED NOT DETECTED Final   Chlamydophila pneumoniae NOT DETECTED NOT DETECTED Final   Mycoplasma pneumoniae NOT DETECTED NOT DETECTED Final    Comment: Performed at [facility], [address] [place], [place] [address]  MRSA Next Gen by PCR, Nasal     Status: None   Collection Time: 05/20/24  7:49 PM   Specimen: Nasal Mucosa; Nasal Swab  Result Value Ref Range Status   MRSA by PCR Next Gen NOT DETECTED NOT DETECTED Final    Comment: (NOTE) The GeneXpert MRSA Assay (FDA approved for NASAL specimens only), is one component of a comprehensive MRSA colonization surveillance program. It is not intended to diagnose MRSA infection nor to guide or monitor treatment for MRSA infections. Test performance is not FDA approved in patients less than 23 years old. Performed at [facility], [address] [place], [place] [address]   Culture, blood (Routine X 2) w Reflex to ID Panel     Status: None   Collection Time: 05/20/24  9:52 PM   Specimen: Right Antecubital; Blood  Result Value Ref Range Status   Specimen Description RIGHT ANTECUBITAL  Final   Special Requests   Final    BOTTLES DRAWN AEROBIC AND ANAEROBIC Blood Culture adequate volume   Culture   Final    NO GROWTH 5 DAYS Performed at [facility], [address] [place], [place] [address]    Report Status 05/25/2024 FINAL  Final  Culture, blood (Routine X 2) w Reflex to ID Panel     Status: None   Collection Time: 05/20/24  9:52 PM   Specimen: BLOOD RIGHT HAND  Result Value Ref Range Status   Specimen Description BLOOD RIGHT HAND  Final   Special Requests   Final    BOTTLES DRAWN AEROBIC ONLY Blood Culture results may not be optimal due to an inadequate volume of blood received in culture bottles    Culture   Final    NO GROWTH 5 DAYS Performed at [facility], [address] [place], [place] [address]    Report Status 05/25/2024 FINAL  Final    Coagulation Studies: No results for input(s): LABPROT, INR in the last 72 hours.  Imaging: CT HEAD WO CONTRAST ( ) Result Date: 05/25/2024 EXAM: CT HEAD WITHOUT 05/24/2024 06:34:49 PM TECHNIQUE: CT of the head was performed without the administration of intravenous contrast. Automated exposure control, iterative reconstruction, and/or weight based adjustment of the mA/kV was utilized to reduce  the radiation dose to as low as reasonably achievable. COMPARISON: CT head 05/20/2024 CLINICAL HISTORY: Neuro deficit, acute, stroke suspected; Family insist on continued encephalopathy, etiology unclear. FINDINGS: BRAIN AND VENTRICLES: No acute intracranial hemorrhage. No mass effect or midline shift. No extra-axial fluid collection. No evidence of acute infarct. Patchy white matter hypodensities, compatible with chronic microvascular ischemic disease. Remote right basal ganglia lacunar infarct. No hydrocephalus. ORBITS: No acute abnormality. SINUSES AND MASTOIDS: No acute abnormality. SOFT TISSUES AND SKULL: No acute skull fracture. No acute soft tissue abnormality. IMPRESSION: 1. No acute intracranial abnormality. 2. Remote right basal ganglia infarct and chronic microvascular ischemic change. Electronically signed by: Gilmore Molt MD 05/25/2024 12:22 AM EST RP Workstation: [id number]    Medications: I have reviewed the patient's current medications. Scheduled:  apixaban   2.5 mg Oral BID   atenolol   25 mg Oral BID   divalproex   125 mg Oral QHS   feeding supplement  237 mL Oral BID BM   isosorbide  mononitrate  60 mg Oral Daily   lisinopril   40 mg Oral Daily   QUEtiapine   12.5 mg Oral QHS    Assessment/Plan: 89 y.o. female with medical history of tremor, hypertension, hyperlipidemia, type 2 diabetes, atrial fibrillation on warfarin  (subtherapeutic INR on presentation), sick sinus syndrome status post pacemaker presenting from her nursing facility for worsening confusion.  Patient unable to provide history.  Per chart, daughter and nursing reported patient had been confused since 12/5. She became confused starting 2 pm and it progressively became worse. At baseline pt uses wheelchair but can use the bathroom by herself. Work up has been fairly unremarkable other than an atrial appendage thrombus seen with CT imaging.  Patient being changed to Eliquis .  INR subtherapeutic at admission.  Patient on presentation was agitated, not following commands and with slurred speech.  Seems improved from this currently. From review of chart patient has had a previous admission for hallucinations and another presentation for altered mental status. At her age the idea that she may be developing a dementia is not out of the range of what is possible.  Unclear why patient is on Depakote  and Seroquel .  The possibility of an acute infarct with her atrial fibrillation and possible atrial appendage thrombus is considerable as well.  Initial head CT reviewed and shows no acute changes. Patient unable to have MRI due to pacemaker but being adequately anticoagulated.  Repeat head CT performed was personally reviewed and unchanged from her initial.   TSH, folate, B12 are unremarkable.  ESR mildly elevated.  B1 pending.  EEG normal.  Per notes, family feels patient near baseline.  With that in mind it is likely that the patient has a dementia although she has not been given a formal diagnosis  Recommendations: Continue Apixaban  Follow up with neurology on an outpatient basis for formal dementia evaluation No further neurologic intervention is recommended at this time.  If further questions arise, please call or page at that time.  Thank you for allowing neurology to participate in the care of this patient.    LOS: 6 days   Sonny Hock,  MD Neurology  05/26/2024  1:24 PM

## 2024-05-26 NOTE — Plan of Care (Signed)
°  Problem: Safety: Goal: Non-violent Restraint(s) Outcome: Adequate for Discharge   Problem: Education: Goal: Knowledge of General Education information will improve Description: Including pain rating scale, medication(s)/side effects and non-pharmacologic comfort measures Outcome: Adequate for Discharge   Problem: Health Behavior/Discharge Planning: Goal: Ability to manage health-related needs will improve Outcome: Adequate for Discharge   Problem: Clinical Measurements: Goal: Ability to maintain clinical measurements within normal limits will improve Outcome: Adequate for Discharge Goal: Will remain free from infection Outcome: Adequate for Discharge Goal: Diagnostic test results will improve Outcome: Adequate for Discharge Goal: Respiratory complications will improve Outcome: Adequate for Discharge Goal: Cardiovascular complication will be avoided Outcome: Adequate for Discharge   Problem: Activity: Goal: Risk for activity intolerance will decrease Outcome: Adequate for Discharge   Problem: Nutrition: Goal: Adequate nutrition will be maintained Outcome: Adequate for Discharge   Problem: Coping: Goal: Level of anxiety will decrease Outcome: Adequate for Discharge   Problem: Elimination: Goal: Will not experience complications related to bowel motility Outcome: Adequate for Discharge Goal: Will not experience complications related to urinary retention Outcome: Adequate for Discharge   Problem: Pain Managment: Goal: General experience of comfort will improve and/or be controlled Outcome: Adequate for Discharge   Problem: Safety: Goal: Ability to remain free from injury will improve Outcome: Adequate for Discharge   Problem: Skin Integrity: Goal: Risk for impaired skin integrity will decrease Outcome: Adequate for Discharge   Problem: Education: Goal: Ability to describe self-care measures that may prevent or decrease complications (Diabetes Survival Skills  Education) will improve Outcome: Adequate for Discharge Goal: Individualized Educational Video(s) Outcome: Adequate for Discharge   Problem: Coping: Goal: Ability to adjust to condition or change in health will improve Outcome: Adequate for Discharge   Problem: Fluid Volume: Goal: Ability to maintain a balanced intake and output will improve Outcome: Adequate for Discharge   Problem: Health Behavior/Discharge Planning: Goal: Ability to identify and utilize available resources and services will improve Outcome: Adequate for Discharge Goal: Ability to manage health-related needs will improve Outcome: Adequate for Discharge   Problem: Metabolic: Goal: Ability to maintain appropriate glucose levels will improve Outcome: Adequate for Discharge   Problem: Nutritional: Goal: Maintenance of adequate nutrition will improve Outcome: Adequate for Discharge Goal: Progress toward achieving an optimal weight will improve Outcome: Adequate for Discharge   Problem: Skin Integrity: Goal: Risk for impaired skin integrity will decrease Outcome: Adequate for Discharge   Problem: Tissue Perfusion: Goal: Adequacy of tissue perfusion will improve Outcome: Adequate for Discharge   Problem: Education: Goal: Knowledge of disease or condition will improve Outcome: Adequate for Discharge Goal: Understanding of medication regimen will improve Outcome: Adequate for Discharge Goal: Individualized Educational Video(s) Outcome: Adequate for Discharge   Problem: Activity: Goal: Ability to tolerate increased activity will improve Outcome: Adequate for Discharge   Problem: Cardiac: Goal: Ability to achieve and maintain adequate cardiopulmonary perfusion will improve Outcome: Adequate for Discharge   Problem: Health Behavior/Discharge Planning: Goal: Ability to safely manage health-related needs after discharge will improve Outcome: Adequate for Discharge   Problem: Education: Goal: Ability to  demonstrate management of disease process will improve Outcome: Adequate for Discharge Goal: Ability to verbalize understanding of medication therapies will improve Outcome: Adequate for Discharge Goal: Individualized Educational Video(s) Outcome: Adequate for Discharge   Problem: Activity: Goal: Capacity to carry out activities will improve Outcome: Adequate for Discharge   Problem: Cardiac: Goal: Ability to achieve and maintain adequate cardiopulmonary perfusion will improve Outcome: Adequate for Discharge

## 2024-05-27 DIAGNOSIS — I10 Essential (primary) hypertension: Secondary | ICD-10-CM | POA: Diagnosis not present

## 2024-05-27 LAB — VITAMIN B1: Vitamin B1 (Thiamine): 127.9 nmol/L (ref 66.5–200.0)

## 2024-05-29 DIAGNOSIS — Z7901 Long term (current) use of anticoagulants: Secondary | ICD-10-CM | POA: Diagnosis not present
